# Patient Record
Sex: Female | Born: 1970 | Race: White | Hispanic: No | Marital: Single | Smoking: Never smoker
Health system: Southern US, Community
[De-identification: ages and names within clinical notes are randomized; demographics above are authoritative.]

## PROBLEM LIST (undated history)

## (undated) ENCOUNTER — Ambulatory Visit: Admission: EM | Discharge status: Absent without leave | Payer: Medicare Other

## (undated) DIAGNOSIS — I82409 Acute embolism and thrombosis of unspecified deep veins of unspecified lower extremity: Secondary | ICD-10-CM

## (undated) DIAGNOSIS — M1990 Unspecified osteoarthritis, unspecified site: Secondary | ICD-10-CM

## (undated) DIAGNOSIS — I8290 Acute embolism and thrombosis of unspecified vein: Secondary | ICD-10-CM

## (undated) DIAGNOSIS — J45909 Unspecified asthma, uncomplicated: Secondary | ICD-10-CM

## (undated) DIAGNOSIS — E079 Disorder of thyroid, unspecified: Secondary | ICD-10-CM

## (undated) DIAGNOSIS — K5190 Ulcerative colitis, unspecified, without complications: Secondary | ICD-10-CM

## (undated) DIAGNOSIS — M329 Systemic lupus erythematosus, unspecified: Secondary | ICD-10-CM

## (undated) DIAGNOSIS — M069 Rheumatoid arthritis, unspecified: Secondary | ICD-10-CM

## (undated) DIAGNOSIS — B019 Varicella without complication: Secondary | ICD-10-CM

## (undated) DIAGNOSIS — B029 Zoster without complications: Secondary | ICD-10-CM

## (undated) DIAGNOSIS — K219 Gastro-esophageal reflux disease without esophagitis: Secondary | ICD-10-CM

## (undated) DIAGNOSIS — J449 Chronic obstructive pulmonary disease, unspecified: Secondary | ICD-10-CM

## (undated) DIAGNOSIS — Q909 Down syndrome, unspecified: Secondary | ICD-10-CM

## (undated) DIAGNOSIS — H9192 Unspecified hearing loss, left ear: Secondary | ICD-10-CM

## (undated) DIAGNOSIS — E039 Hypothyroidism, unspecified: Secondary | ICD-10-CM

## (undated) DIAGNOSIS — M359 Systemic involvement of connective tissue, unspecified: Secondary | ICD-10-CM

## (undated) DIAGNOSIS — M810 Age-related osteoporosis without current pathological fracture: Secondary | ICD-10-CM

## (undated) DIAGNOSIS — T07XXXA Unspecified multiple injuries, initial encounter: Secondary | ICD-10-CM

## (undated) DIAGNOSIS — K5100 Ulcerative (chronic) pancolitis without complications: Secondary | ICD-10-CM

## (undated) DIAGNOSIS — K529 Noninfective gastroenteritis and colitis, unspecified: Secondary | ICD-10-CM

## (undated) DIAGNOSIS — M2550 Pain in unspecified joint: Secondary | ICD-10-CM

## (undated) HISTORY — PX: ABDOMINAL HYSTERECTOMY: SHX81

## (undated) HISTORY — DX: Down syndrome, unspecified: Q90.9

## (undated) HISTORY — DX: Unspecified multiple injuries, initial encounter: T07.XXXA

## (undated) HISTORY — DX: Pain in unspecified joint: M25.50

## (undated) HISTORY — DX: Unspecified osteoarthritis, unspecified site: M19.90

## (undated) HISTORY — DX: Varicella without complication: B01.9

## (undated) HISTORY — DX: Unspecified hearing loss, left ear: H91.92

## (undated) HISTORY — DX: Disorder of thyroid, unspecified: E07.9

## (undated) HISTORY — DX: Noninfective gastroenteritis and colitis, unspecified: K52.9

## (undated) HISTORY — DX: Rheumatoid arthritis, unspecified: M06.9

## (undated) HISTORY — DX: Unspecified asthma, uncomplicated: J45.909

## (undated) HISTORY — DX: Zoster without complications: B02.9

## (undated) HISTORY — DX: Ulcerative colitis, unspecified, without complications: K51.90

## (undated) HISTORY — DX: Chronic obstructive pulmonary disease, unspecified: J44.9

## (undated) HISTORY — PX: FOOT SURGERY: SHX648

## (undated) HISTORY — PX: BUNIONECTOMY: SHX129

## (undated) HISTORY — DX: Age-related osteoporosis without current pathological fracture: M81.0

## (undated) HISTORY — DX: Acute embolism and thrombosis of unspecified deep veins of unspecified lower extremity: I82.409

## (undated) HISTORY — PX: HALLUX VALGUS CORRECTION: SUR315

---

## 1990-05-31 HISTORY — PX: ABDOMINAL HYSTERECTOMY W/ PARTIAL VAGINACTOMY: SUR660

## 1993-05-31 DIAGNOSIS — M329 Systemic lupus erythematosus, unspecified: Secondary | ICD-10-CM

## 1993-05-31 DIAGNOSIS — IMO0002 Reserved for concepts with insufficient information to code with codable children: Secondary | ICD-10-CM

## 1993-05-31 HISTORY — DX: Systemic lupus erythematosus, unspecified: M32.9

## 1993-05-31 HISTORY — DX: Reserved for concepts with insufficient information to code with codable children: IMO0002

## 1997-10-31 ENCOUNTER — Emergency Department (HOSPITAL_COMMUNITY): Admission: EM | Admit: 1997-10-31 | Discharge: 1997-10-31 | Payer: Self-pay

## 1998-08-07 ENCOUNTER — Encounter: Payer: Self-pay | Admitting: Emergency Medicine

## 1998-08-07 ENCOUNTER — Emergency Department (HOSPITAL_COMMUNITY): Admission: EM | Admit: 1998-08-07 | Discharge: 1998-08-07 | Payer: Self-pay | Admitting: Emergency Medicine

## 2003-07-25 ENCOUNTER — Encounter: Admission: RE | Admit: 2003-07-25 | Discharge: 2003-08-21 | Payer: Self-pay

## 2003-09-24 HISTORY — PX: COLONOSCOPY: SHX174

## 2004-07-02 ENCOUNTER — Ambulatory Visit: Payer: Self-pay | Admitting: Internal Medicine

## 2004-09-22 ENCOUNTER — Ambulatory Visit: Payer: Self-pay | Admitting: Rheumatology

## 2004-09-30 ENCOUNTER — Emergency Department (HOSPITAL_COMMUNITY): Admission: EM | Admit: 2004-09-30 | Discharge: 2004-09-30 | Payer: Self-pay | Admitting: Emergency Medicine

## 2004-10-12 ENCOUNTER — Ambulatory Visit: Payer: Self-pay | Admitting: Cardiology

## 2004-10-12 ENCOUNTER — Ambulatory Visit: Payer: Self-pay | Admitting: Internal Medicine

## 2005-03-23 ENCOUNTER — Ambulatory Visit (HOSPITAL_COMMUNITY): Admission: RE | Admit: 2005-03-23 | Discharge: 2005-03-23 | Payer: Self-pay | Admitting: Internal Medicine

## 2005-03-23 ENCOUNTER — Ambulatory Visit: Payer: Self-pay | Admitting: Internal Medicine

## 2005-04-27 ENCOUNTER — Ambulatory Visit: Payer: Self-pay | Admitting: Internal Medicine

## 2005-05-03 ENCOUNTER — Ambulatory Visit: Payer: Self-pay | Admitting: Internal Medicine

## 2005-05-03 ENCOUNTER — Ambulatory Visit (HOSPITAL_COMMUNITY): Admission: RE | Admit: 2005-05-03 | Discharge: 2005-05-03 | Payer: Self-pay

## 2005-05-06 ENCOUNTER — Ambulatory Visit: Payer: Self-pay | Admitting: Internal Medicine

## 2005-06-25 ENCOUNTER — Ambulatory Visit: Payer: Self-pay | Admitting: Internal Medicine

## 2005-07-05 ENCOUNTER — Ambulatory Visit: Payer: Self-pay | Admitting: Internal Medicine

## 2006-01-07 ENCOUNTER — Ambulatory Visit: Payer: Self-pay | Admitting: Internal Medicine

## 2006-03-10 ENCOUNTER — Encounter (INDEPENDENT_AMBULATORY_CARE_PROVIDER_SITE_OTHER): Payer: Self-pay | Admitting: Internal Medicine

## 2006-03-10 ENCOUNTER — Ambulatory Visit: Payer: Self-pay | Admitting: Internal Medicine

## 2006-03-10 LAB — CONVERTED CEMR LAB
ALT: 18 units/L (ref 0–40)
AST: 23 units/L (ref 0–37)
Albumin: 3.6 g/dL (ref 3.5–5.2)
BUN: 17 mg/dL (ref 6–23)
Bilirubin Urine: NEGATIVE
CO2: 23 meq/L (ref 19–32)
Calcium: 9.1 mg/dL (ref 8.4–10.5)
Chloride: 103 meq/L (ref 96–112)
Creatinine, Ser: 1.01 mg/dL (ref 0.40–1.20)
Glucose, Bld: 84 mg/dL (ref 70–99)
Ketones, ur: NEGATIVE mg/dL
Leukocyte count, blood: 5.4 10*9/L (ref 4.0–10.5)
MCHC: 32 g/dL (ref 30.0–36.0)
MCV: 106 fL — ABNORMAL HIGH (ref 78.0–100.0)
Platelets: 300 10*3/uL (ref 150–400)
Protein, ur: NEGATIVE mg/dL
RBC: 4.01 M/uL (ref 3.87–5.11)
Sodium: 139 meq/L (ref 135–145)
Specific Gravity, Urine: 1.02 (ref 1.005–1.03)
Total Protein: 6.8 g/dL (ref 6.0–8.3)
WBC Urine, dipstick: NEGATIVE
pH: 6 (ref 5.0–8.0)

## 2006-03-18 DIAGNOSIS — J45909 Unspecified asthma, uncomplicated: Secondary | ICD-10-CM | POA: Insufficient documentation

## 2006-03-18 DIAGNOSIS — K219 Gastro-esophageal reflux disease without esophagitis: Secondary | ICD-10-CM

## 2006-03-18 DIAGNOSIS — E039 Hypothyroidism, unspecified: Secondary | ICD-10-CM

## 2006-03-18 DIAGNOSIS — Q909 Down syndrome, unspecified: Secondary | ICD-10-CM

## 2006-03-18 DIAGNOSIS — L708 Other acne: Secondary | ICD-10-CM

## 2006-03-21 ENCOUNTER — Ambulatory Visit: Payer: Self-pay | Admitting: Hospitalist

## 2006-03-21 ENCOUNTER — Encounter: Admission: RE | Admit: 2006-03-21 | Discharge: 2006-03-21 | Payer: Self-pay | Admitting: Internal Medicine

## 2006-03-21 ENCOUNTER — Ambulatory Visit: Payer: Self-pay | Admitting: Internal Medicine

## 2006-03-22 ENCOUNTER — Inpatient Hospital Stay (HOSPITAL_COMMUNITY): Admission: AD | Admit: 2006-03-22 | Discharge: 2006-03-24 | Payer: Self-pay | Admitting: Hospitalist

## 2006-04-06 ENCOUNTER — Encounter (INDEPENDENT_AMBULATORY_CARE_PROVIDER_SITE_OTHER): Payer: Self-pay | Admitting: Internal Medicine

## 2006-04-06 ENCOUNTER — Ambulatory Visit (HOSPITAL_COMMUNITY): Admission: RE | Admit: 2006-04-06 | Discharge: 2006-04-06 | Payer: Self-pay | Admitting: Hospitalist

## 2006-04-06 ENCOUNTER — Ambulatory Visit: Payer: Self-pay | Admitting: Hospitalist

## 2006-04-06 LAB — CONVERTED CEMR LAB
ALT: 25 units/L (ref 0–35)
AST: 25 units/L (ref 0–37)
Albumin: 3.3 g/dL — ABNORMAL LOW (ref 3.5–5.2)
BUN: 13 mg/dL (ref 6–23)
CO2: 29 meq/L (ref 19–32)
Chloride: 102 meq/L (ref 96–112)
Creatinine, Ser: 0.9 mg/dL (ref 0.40–1.20)
Glucose, Bld: 86 mg/dL (ref 70–99)
Platelets: 280 10*3/uL (ref 152–374)
Sodium: 137 meq/L (ref 135–145)
Total Protein: 6 g/dL (ref 6.0–8.3)

## 2006-05-05 ENCOUNTER — Ambulatory Visit: Payer: Self-pay | Admitting: Internal Medicine

## 2006-05-05 ENCOUNTER — Ambulatory Visit (HOSPITAL_COMMUNITY): Admission: RE | Admit: 2006-05-05 | Discharge: 2006-05-05 | Payer: Self-pay | Admitting: Internal Medicine

## 2006-05-05 ENCOUNTER — Encounter (INDEPENDENT_AMBULATORY_CARE_PROVIDER_SITE_OTHER): Payer: Self-pay | Admitting: *Deleted

## 2006-05-05 LAB — CONVERTED CEMR LAB
ALT: 23 units/L (ref 0–35)
AST: 27 units/L (ref 0–37)
BUN: 18 mg/dL (ref 6–23)
Basophils Relative: 0 % (ref 0–1)
Eosinophils Relative: 1 % (ref 0–4)
HCT: 38.7 % (ref 34.4–43.3)
Hemoglobin: 13.3 g/dL (ref 11.7–14.8)
Lymphocytes Relative: 9 % — ABNORMAL LOW (ref 15–43)
Lymphs Abs: 0.6 10*3/uL — ABNORMAL LOW (ref 0.8–3.1)
MCHC: 34.3 g/dL (ref 33.1–35.4)
Neutro Abs: 5.7 10*3/uL (ref 1.8–6.8)
Platelets: 280 10*3/uL (ref 152–374)
RBC Folate: 1167 ng/mL — ABNORMAL HIGH (ref 180–600)
RBC: 3.65 M/uL — ABNORMAL LOW (ref 3.79–4.96)
Total Bilirubin: 0.5 mg/dL (ref 0.3–1.2)
Total Protein: 6.4 g/dL (ref 6.0–8.3)

## 2006-06-11 DIAGNOSIS — M949 Disorder of cartilage, unspecified: Secondary | ICD-10-CM

## 2006-06-11 DIAGNOSIS — M1990 Unspecified osteoarthritis, unspecified site: Secondary | ICD-10-CM | POA: Insufficient documentation

## 2006-06-11 DIAGNOSIS — K5190 Ulcerative colitis, unspecified, without complications: Secondary | ICD-10-CM | POA: Insufficient documentation

## 2006-06-11 DIAGNOSIS — M06042 Rheumatoid arthritis without rheumatoid factor, left hand: Secondary | ICD-10-CM

## 2006-06-11 DIAGNOSIS — M06041 Rheumatoid arthritis without rheumatoid factor, right hand: Secondary | ICD-10-CM

## 2006-06-11 DIAGNOSIS — Z90710 Acquired absence of both cervix and uterus: Secondary | ICD-10-CM | POA: Insufficient documentation

## 2006-06-11 DIAGNOSIS — M899 Disorder of bone, unspecified: Secondary | ICD-10-CM | POA: Insufficient documentation

## 2006-08-09 ENCOUNTER — Ambulatory Visit: Payer: Self-pay | Admitting: Internal Medicine

## 2006-08-23 ENCOUNTER — Encounter (INDEPENDENT_AMBULATORY_CARE_PROVIDER_SITE_OTHER): Payer: Self-pay | Admitting: *Deleted

## 2006-08-25 ENCOUNTER — Encounter (INDEPENDENT_AMBULATORY_CARE_PROVIDER_SITE_OTHER): Payer: Self-pay | Admitting: Internal Medicine

## 2006-08-25 ENCOUNTER — Ambulatory Visit: Payer: Self-pay | Admitting: *Deleted

## 2006-09-03 LAB — CONVERTED CEMR LAB
ALT: 16 units/L (ref 0–35)
AST: 19 units/L (ref 0–37)
Albumin: 3.5 g/dL (ref 3.5–5.2)
BUN: 16 mg/dL (ref 6–23)
Calcium: 8.7 mg/dL (ref 8.4–10.5)
Creatinine, Ser: 0.87 mg/dL (ref 0.40–1.20)
Glucose, Bld: 80 mg/dL (ref 70–99)
HCT: 43.7 % (ref 36.0–46.0)
Hemoglobin: 13.5 g/dL (ref 12.0–15.0)
MCHC: 30.9 g/dL (ref 30.0–36.0)
Platelets: 283 10*3/uL (ref 150–400)
Potassium: 4.5 meq/L (ref 3.5–5.3)
Total Bilirubin: 0.4 mg/dL (ref 0.3–1.2)
Total Protein: 6.8 g/dL (ref 6.0–8.3)
WBC: 4.9 10*3/uL (ref 4.0–10.5)

## 2006-09-29 ENCOUNTER — Observation Stay (HOSPITAL_COMMUNITY): Admission: EM | Admit: 2006-09-29 | Discharge: 2006-09-30 | Payer: Self-pay | Admitting: Emergency Medicine

## 2006-09-29 ENCOUNTER — Ambulatory Visit: Payer: Self-pay | Admitting: Internal Medicine

## 2006-09-30 ENCOUNTER — Encounter (INDEPENDENT_AMBULATORY_CARE_PROVIDER_SITE_OTHER): Payer: Self-pay | Admitting: Internal Medicine

## 2006-10-14 ENCOUNTER — Encounter (INDEPENDENT_AMBULATORY_CARE_PROVIDER_SITE_OTHER): Payer: Self-pay | Admitting: Hospitalist

## 2006-10-20 ENCOUNTER — Encounter (INDEPENDENT_AMBULATORY_CARE_PROVIDER_SITE_OTHER): Payer: Self-pay | Admitting: Internal Medicine

## 2006-10-20 ENCOUNTER — Ambulatory Visit: Payer: Self-pay | Admitting: Hospitalist

## 2006-11-03 LAB — CONVERTED CEMR LAB
AST: 23 units/L (ref 0–37)
Albumin: 4.1 g/dL (ref 3.5–5.2)
Basophils Relative: 1 % (ref 0–1)
CO2: 27 meq/L (ref 19–32)
Calcium: 9.6 mg/dL (ref 8.4–10.5)
Creatinine, Ser: 0.98 mg/dL (ref 0.40–1.20)
HCT: 45.7 % (ref 36.0–46.0)
Hemoglobin: 14.7 g/dL (ref 12.0–15.0)
Lymphs Abs: 0.9 10*3/uL (ref 0.7–3.3)
MCHC: 32.2 g/dL (ref 30.0–36.0)
Monocytes Absolute: 0.3 10*3/uL (ref 0.2–0.7)
Neutrophils Relative %: 76 % (ref 43–77)
Platelets: 262 10*3/uL (ref 150–400)
Sodium: 143 meq/L (ref 135–145)
TSH: 0.125 microintl units/mL — ABNORMAL LOW (ref 0.350–5.50)

## 2006-11-21 ENCOUNTER — Encounter (INDEPENDENT_AMBULATORY_CARE_PROVIDER_SITE_OTHER): Payer: Self-pay | Admitting: Internal Medicine

## 2006-11-21 ENCOUNTER — Ambulatory Visit: Payer: Self-pay | Admitting: Internal Medicine

## 2006-11-25 ENCOUNTER — Telehealth (INDEPENDENT_AMBULATORY_CARE_PROVIDER_SITE_OTHER): Payer: Self-pay | Admitting: Internal Medicine

## 2006-11-25 LAB — CONVERTED CEMR LAB
AST: 22 units/L (ref 0–37)
Albumin: 3.9 g/dL (ref 3.5–5.2)
BUN: 16 mg/dL (ref 6–23)
Basophils Relative: 0 % (ref 0–1)
Creatinine, Ser: 0.94 mg/dL (ref 0.40–1.20)
Hemoglobin: 14.4 g/dL (ref 12.0–15.0)
Lymphs Abs: 0.7 10*3/uL (ref 0.7–3.3)
MCHC: 31.2 g/dL (ref 30.0–36.0)
MCV: 111.1 fL — ABNORMAL HIGH (ref 78.0–100.0)
Monocytes Absolute: 0.4 10*3/uL (ref 0.2–0.7)
Monocytes Relative: 6 % (ref 3–11)
Neutrophils Relative %: 83 % — ABNORMAL HIGH (ref 43–77)
Platelets: 284 10*3/uL (ref 150–400)
RBC: 4.15 M/uL (ref 3.87–5.11)
Total Bilirubin: 0.4 mg/dL (ref 0.3–1.2)
Vitamin B-12: 453 pg/mL (ref 211–911)
WBC: 6.8 10*3/uL (ref 4.0–10.5)

## 2006-12-08 ENCOUNTER — Encounter (INDEPENDENT_AMBULATORY_CARE_PROVIDER_SITE_OTHER): Payer: Self-pay | Admitting: Internal Medicine

## 2007-01-02 ENCOUNTER — Encounter (INDEPENDENT_AMBULATORY_CARE_PROVIDER_SITE_OTHER): Payer: Self-pay | Admitting: Internal Medicine

## 2007-01-27 ENCOUNTER — Ambulatory Visit: Payer: Self-pay | Admitting: Internal Medicine

## 2007-01-27 ENCOUNTER — Encounter: Payer: Self-pay | Admitting: Internal Medicine

## 2007-02-05 LAB — CONVERTED CEMR LAB
ALT: 17 units/L (ref 0–35)
Calcium: 9.2 mg/dL (ref 8.4–10.5)
Chloride: 103 meq/L (ref 96–112)
Creatinine, Ser: 0.9 mg/dL (ref 0.40–1.20)
Hemoglobin: 14.8 g/dL (ref 12.0–15.0)
Lymphocytes Relative: 15 % (ref 12–46)
MCV: 107.6 fL — ABNORMAL HIGH (ref 78.0–100.0)
Monocytes Absolute: 0.3 10*3/uL (ref 0.2–0.7)
Monocytes Relative: 6 % (ref 3–11)
Neutro Abs: 3.9 10*3/uL (ref 1.7–7.7)
Neutrophils Relative %: 77 % (ref 43–77)
RBC: 4.19 M/uL (ref 3.87–5.11)
RDW: 15.1 % — ABNORMAL HIGH (ref 11.5–14.0)
Sodium: 139 meq/L (ref 135–145)
Total Protein: 7.3 g/dL (ref 6.0–8.3)

## 2007-05-30 ENCOUNTER — Encounter (INDEPENDENT_AMBULATORY_CARE_PROVIDER_SITE_OTHER): Payer: Self-pay | Admitting: Internal Medicine

## 2007-05-30 ENCOUNTER — Ambulatory Visit: Payer: Self-pay | Admitting: Hospitalist

## 2007-05-31 LAB — CONVERTED CEMR LAB
ALT: 30 units/L (ref 0–35)
AST: 28 units/L (ref 0–37)
BUN: 12 mg/dL (ref 6–23)
Basophils Absolute: 0 10*3/uL (ref 0.0–0.1)
Basophils Relative: 1 % (ref 0–1)
CO2: 27 meq/L (ref 19–32)
Chloride: 108 meq/L (ref 96–112)
Creatinine, Ser: 0.82 mg/dL (ref 0.40–1.20)
Glucose, Bld: 92 mg/dL (ref 70–99)
HCT: 43.6 % (ref 36.0–46.0)
Ketones, ur: NEGATIVE mg/dL
Leukocytes, UA: NEGATIVE
Lymphs Abs: 0.6 10*3/uL — ABNORMAL LOW (ref 0.7–4.0)
Monocytes Relative: 6 % (ref 3–12)
Neutro Abs: 4.8 10*3/uL (ref 1.7–7.7)
Nitrite: NEGATIVE
Platelets: 284 10*3/uL (ref 150–400)
Potassium: 4.2 meq/L (ref 3.5–5.3)
Protein, ur: NEGATIVE mg/dL
RBC: 3.99 M/uL (ref 3.87–5.11)
Sodium: 140 meq/L (ref 135–145)
Total Protein: 6.8 g/dL (ref 6.0–8.3)
WBC: 5.9 10*3/uL (ref 4.0–10.5)

## 2007-07-04 ENCOUNTER — Encounter (INDEPENDENT_AMBULATORY_CARE_PROVIDER_SITE_OTHER): Payer: Self-pay | Admitting: Internal Medicine

## 2007-07-04 ENCOUNTER — Ambulatory Visit: Payer: Self-pay | Admitting: Internal Medicine

## 2007-07-07 LAB — CONVERTED CEMR LAB
ALT: 23 units/L (ref 0–35)
Basophils Absolute: 0 10*3/uL (ref 0.0–0.1)
CO2: 27 meq/L (ref 19–32)
Chloride: 105 meq/L (ref 96–112)
Creatinine, Ser: 0.91 mg/dL (ref 0.40–1.20)
Eosinophils Relative: 1 % (ref 0–5)
HCT: 44.2 % (ref 36.0–46.0)
Hemoglobin: 14.4 g/dL (ref 12.0–15.0)
MCHC: 32.6 g/dL (ref 30.0–36.0)
MCV: 107 fL — ABNORMAL HIGH (ref 78.0–100.0)
Neutro Abs: 6.2 10*3/uL (ref 1.7–7.7)
Potassium: 4.4 meq/L (ref 3.5–5.3)
RBC: 4.13 M/uL (ref 3.87–5.11)
RDW: 15 % (ref 11.5–15.5)
Total Protein: 6.9 g/dL (ref 6.0–8.3)
WBC: 7.5 10*3/uL (ref 4.0–10.5)

## 2007-07-18 ENCOUNTER — Ambulatory Visit: Payer: Self-pay | Admitting: *Deleted

## 2007-07-18 ENCOUNTER — Observation Stay (HOSPITAL_COMMUNITY): Admission: AD | Admit: 2007-07-18 | Discharge: 2007-07-19 | Payer: Self-pay | Admitting: Infectious Diseases

## 2007-07-18 ENCOUNTER — Telehealth (INDEPENDENT_AMBULATORY_CARE_PROVIDER_SITE_OTHER): Payer: Self-pay | Admitting: *Deleted

## 2007-07-18 ENCOUNTER — Ambulatory Visit: Payer: Self-pay | Admitting: Internal Medicine

## 2007-08-17 ENCOUNTER — Encounter (INDEPENDENT_AMBULATORY_CARE_PROVIDER_SITE_OTHER): Payer: Self-pay | Admitting: Internal Medicine

## 2007-09-20 ENCOUNTER — Ambulatory Visit (HOSPITAL_COMMUNITY): Admission: RE | Admit: 2007-09-20 | Discharge: 2007-09-20 | Payer: Self-pay | Admitting: Hospitalist

## 2007-09-20 ENCOUNTER — Encounter (INDEPENDENT_AMBULATORY_CARE_PROVIDER_SITE_OTHER): Payer: Self-pay | Admitting: Internal Medicine

## 2007-09-20 ENCOUNTER — Ambulatory Visit: Payer: Self-pay | Admitting: Hospitalist

## 2007-09-20 LAB — CONVERTED CEMR LAB
Basophils Absolute: 0 10*3/uL (ref 0.0–0.1)
EBV VCA IgG: 0.28
HCT: 42.3 % (ref 36.0–46.0)
Lymphocytes Relative: 17 % (ref 12–46)
Lymphs Abs: 1.1 10*3/uL (ref 0.7–4.0)
MCHC: 33.1 g/dL (ref 30.0–36.0)
MCV: 105 fL — ABNORMAL HIGH (ref 78.0–100.0)
Neutro Abs: 4.7 10*3/uL (ref 1.7–7.7)
Platelets: 330 10*3/uL (ref 150–400)
RDW: 16 % — ABNORMAL HIGH (ref 11.5–15.5)

## 2007-09-22 ENCOUNTER — Encounter (INDEPENDENT_AMBULATORY_CARE_PROVIDER_SITE_OTHER): Payer: Self-pay | Admitting: Internal Medicine

## 2007-09-22 ENCOUNTER — Telehealth: Payer: Self-pay | Admitting: *Deleted

## 2007-09-22 ENCOUNTER — Ambulatory Visit: Payer: Self-pay | Admitting: Hospitalist

## 2007-09-26 ENCOUNTER — Ambulatory Visit (HOSPITAL_COMMUNITY): Admission: RE | Admit: 2007-09-26 | Discharge: 2007-09-26 | Payer: Self-pay | Admitting: Internal Medicine

## 2007-10-12 ENCOUNTER — Encounter (INDEPENDENT_AMBULATORY_CARE_PROVIDER_SITE_OTHER): Payer: Self-pay | Admitting: Internal Medicine

## 2007-10-17 ENCOUNTER — Encounter (INDEPENDENT_AMBULATORY_CARE_PROVIDER_SITE_OTHER): Payer: Self-pay | Admitting: Internal Medicine

## 2007-10-19 ENCOUNTER — Ambulatory Visit (HOSPITAL_COMMUNITY): Admission: RE | Admit: 2007-10-19 | Discharge: 2007-10-19 | Payer: Self-pay | Admitting: Internal Medicine

## 2007-10-19 ENCOUNTER — Ambulatory Visit: Payer: Self-pay | Admitting: Internal Medicine

## 2007-10-20 ENCOUNTER — Ambulatory Visit: Payer: Self-pay | Admitting: Internal Medicine

## 2007-11-29 ENCOUNTER — Ambulatory Visit: Payer: Self-pay | Admitting: Internal Medicine

## 2007-11-29 ENCOUNTER — Ambulatory Visit (HOSPITAL_COMMUNITY): Admission: RE | Admit: 2007-11-29 | Discharge: 2007-11-29 | Payer: Self-pay | Admitting: Internal Medicine

## 2007-12-12 ENCOUNTER — Encounter (INDEPENDENT_AMBULATORY_CARE_PROVIDER_SITE_OTHER): Payer: Self-pay | Admitting: Internal Medicine

## 2007-12-14 ENCOUNTER — Ambulatory Visit: Payer: Self-pay | Admitting: *Deleted

## 2007-12-27 ENCOUNTER — Telehealth (INDEPENDENT_AMBULATORY_CARE_PROVIDER_SITE_OTHER): Payer: Self-pay | Admitting: Internal Medicine

## 2008-01-10 ENCOUNTER — Ambulatory Visit: Payer: Self-pay | Admitting: Internal Medicine

## 2008-01-12 ENCOUNTER — Telehealth: Payer: Self-pay | Admitting: *Deleted

## 2008-02-09 ENCOUNTER — Emergency Department (HOSPITAL_COMMUNITY): Admission: EM | Admit: 2008-02-09 | Discharge: 2008-02-09 | Payer: Self-pay | Admitting: Family Medicine

## 2008-07-18 ENCOUNTER — Ambulatory Visit: Payer: Self-pay | Admitting: Internal Medicine

## 2008-07-19 DIAGNOSIS — M329 Systemic lupus erythematosus, unspecified: Secondary | ICD-10-CM

## 2008-07-19 LAB — CONVERTED CEMR LAB
Alkaline Phosphatase: 95 units/L (ref 39–117)
Basophils Absolute: 0 10*3/uL (ref 0.0–0.1)
Basophils Relative: 0 % (ref 0–1)
Bilirubin Urine: NEGATIVE
CO2: 27 meq/L (ref 19–32)
Calcium: 8.9 mg/dL (ref 8.4–10.5)
Chloride: 105 meq/L (ref 96–112)
Creatinine, Ser: 0.84 mg/dL (ref 0.40–1.20)
Eosinophils Absolute: 0.1 10*3/uL (ref 0.0–0.7)
Glucose, Bld: 93 mg/dL (ref 70–99)
Leukocytes, UA: NEGATIVE
MCV: 109.3 fL — ABNORMAL HIGH (ref 78.0–100.0)
Monocytes Absolute: 0.5 10*3/uL (ref 0.1–1.0)
Neutro Abs: 6.2 10*3/uL (ref 1.7–7.7)
Protein, ur: NEGATIVE mg/dL
RBC: 4.18 M/uL (ref 3.87–5.11)
Sodium: 142 meq/L (ref 135–145)
Total Bilirubin: 0.4 mg/dL (ref 0.3–1.2)
Triglycerides: 111 mg/dL (ref <150)
Urobilinogen, UA: 0.2 (ref 0.0–1.0)
pH: 6.5 (ref 5.0–8.0)

## 2008-07-22 ENCOUNTER — Telehealth: Payer: Self-pay | Admitting: *Deleted

## 2008-12-09 ENCOUNTER — Encounter (INDEPENDENT_AMBULATORY_CARE_PROVIDER_SITE_OTHER): Payer: Self-pay | Admitting: Internal Medicine

## 2009-01-31 ENCOUNTER — Encounter: Payer: Self-pay | Admitting: Internal Medicine

## 2009-01-31 ENCOUNTER — Ambulatory Visit: Payer: Self-pay | Admitting: Infectious Diseases

## 2009-01-31 LAB — CONVERTED CEMR LAB
AST: 23 units/L (ref 0–37)
BUN: 15 mg/dL (ref 6–23)
CO2: 25 meq/L (ref 19–32)
Creatinine, Ser: 0.84 mg/dL (ref 0.40–1.20)
Eosinophils Relative: 1 % (ref 0–5)
Glucose, Bld: 95 mg/dL (ref 70–99)
HCT: 44.1 % (ref 36.0–46.0)
Hemoglobin: 14.6 g/dL (ref 12.0–15.0)
LDL Cholesterol: 112 mg/dL — ABNORMAL HIGH (ref 0–99)
MCHC: 33.1 g/dL (ref 30.0–36.0)
MCV: 108.4 fL — ABNORMAL HIGH (ref >=78.0)
Neutro Abs: 7.1 10*3/uL (ref 1.7–7.7)
Platelets: 270 10*3/uL (ref 150–400)
RBC: 4.07 M/uL (ref 3.87–5.11)
RDW: 14.5 % (ref 11.5–15.5)
Total Bilirubin: 0.5 mg/dL (ref 0.3–1.2)
Total Protein: 7.2 g/dL (ref 6.0–8.3)
WBC: 8.7 10*3/uL (ref 4.0–10.5)

## 2009-02-17 ENCOUNTER — Telehealth: Payer: Self-pay | Admitting: Internal Medicine

## 2009-03-10 ENCOUNTER — Telehealth: Payer: Self-pay | Admitting: Internal Medicine

## 2009-03-12 ENCOUNTER — Encounter (INDEPENDENT_AMBULATORY_CARE_PROVIDER_SITE_OTHER): Payer: Self-pay | Admitting: Internal Medicine

## 2009-03-12 ENCOUNTER — Ambulatory Visit: Payer: Self-pay | Admitting: Internal Medicine

## 2009-03-12 DIAGNOSIS — L0293 Carbuncle, unspecified: Secondary | ICD-10-CM

## 2009-03-12 LAB — CONVERTED CEMR LAB
ALT: 27 units/L (ref 0–35)
AST: 29 units/L (ref 0–37)
Alkaline Phosphatase: 91 units/L (ref 39–117)
Calcium: 9 mg/dL (ref 8.4–10.5)
HCT: 42.3 % (ref 36.0–46.0)
Hemoglobin, Urine: NEGATIVE
Hemoglobin: 14.5 g/dL (ref 12.0–15.0)
MCHC: 34.3 g/dL (ref 30.0–36.0)
MCV: 109.8 fL — ABNORMAL HIGH (ref >=78.0)
Potassium: 4.2 meq/L (ref 3.5–5.3)
RDW: 15 % (ref 11.5–15.5)
Total Bilirubin: 0.1 mg/dL — ABNORMAL LOW (ref 0.3–1.2)
Urine Glucose: NEGATIVE mg/dL
Urobilinogen, UA: 0.2 (ref 0.0–1.0)
WBC: 6 10*3/uL (ref 4.0–10.5)

## 2009-03-25 ENCOUNTER — Telehealth: Payer: Self-pay | Admitting: Internal Medicine

## 2009-04-02 ENCOUNTER — Telehealth: Payer: Self-pay | Admitting: Internal Medicine

## 2009-04-30 ENCOUNTER — Ambulatory Visit: Payer: Self-pay | Admitting: Infectious Disease

## 2009-04-30 ENCOUNTER — Encounter (INDEPENDENT_AMBULATORY_CARE_PROVIDER_SITE_OTHER): Payer: Self-pay | Admitting: Internal Medicine

## 2009-05-23 ENCOUNTER — Emergency Department: Payer: Self-pay | Admitting: Internal Medicine

## 2009-05-23 ENCOUNTER — Encounter: Payer: Self-pay | Admitting: Pulmonary Disease

## 2009-05-26 ENCOUNTER — Encounter: Payer: Self-pay | Admitting: Pulmonary Disease

## 2009-05-26 ENCOUNTER — Inpatient Hospital Stay: Payer: Self-pay | Admitting: Internal Medicine

## 2009-05-29 ENCOUNTER — Encounter: Payer: Self-pay | Admitting: Pulmonary Disease

## 2009-06-03 ENCOUNTER — Ambulatory Visit: Payer: Self-pay | Admitting: Internal Medicine

## 2009-06-03 DIAGNOSIS — B370 Candidal stomatitis: Secondary | ICD-10-CM

## 2009-06-03 LAB — CONVERTED CEMR LAB
ALT: 60 units/L — ABNORMAL HIGH (ref 0–35)
Albumin: 3.5 g/dL (ref 3.5–5.2)
Alkaline Phosphatase: 85 units/L (ref 39–117)
BUN: 26 mg/dL — ABNORMAL HIGH (ref 6–23)
CO2: 26 meq/L (ref 19–32)
Chloride: 103 meq/L (ref 96–112)
Glucose, Bld: 124 mg/dL — ABNORMAL HIGH (ref 70–99)
HCT: 46.8 % — ABNORMAL HIGH (ref 36.0–46.0)
Sodium: 141 meq/L (ref 135–145)
Total Protein: 6.5 g/dL (ref 6.0–8.3)

## 2009-06-09 ENCOUNTER — Telehealth: Payer: Self-pay | Admitting: Internal Medicine

## 2009-06-13 ENCOUNTER — Ambulatory Visit: Payer: Self-pay | Admitting: Internal Medicine

## 2009-06-13 ENCOUNTER — Encounter: Payer: Self-pay | Admitting: Pulmonary Disease

## 2009-06-17 ENCOUNTER — Ambulatory Visit: Payer: Self-pay | Admitting: Pulmonary Disease

## 2009-06-17 DIAGNOSIS — J310 Chronic rhinitis: Secondary | ICD-10-CM

## 2009-06-19 ENCOUNTER — Telehealth (INDEPENDENT_AMBULATORY_CARE_PROVIDER_SITE_OTHER): Payer: Self-pay | Admitting: *Deleted

## 2009-06-23 ENCOUNTER — Encounter: Payer: Self-pay | Admitting: Internal Medicine

## 2009-06-25 ENCOUNTER — Encounter: Payer: Self-pay | Admitting: Pulmonary Disease

## 2009-07-22 ENCOUNTER — Encounter: Payer: Self-pay | Admitting: Internal Medicine

## 2009-08-22 ENCOUNTER — Ambulatory Visit: Payer: Self-pay | Admitting: Pulmonary Disease

## 2009-10-01 ENCOUNTER — Ambulatory Visit: Payer: Self-pay | Admitting: Internal Medicine

## 2009-10-02 ENCOUNTER — Encounter: Payer: Self-pay | Admitting: Pulmonary Disease

## 2009-11-24 ENCOUNTER — Telehealth: Payer: Self-pay | Admitting: Internal Medicine

## 2010-01-07 ENCOUNTER — Encounter: Payer: Self-pay | Admitting: Internal Medicine

## 2010-01-08 ENCOUNTER — Telehealth: Payer: Self-pay | Admitting: *Deleted

## 2010-01-29 ENCOUNTER — Ambulatory Visit: Payer: Self-pay | Admitting: Internal Medicine

## 2010-01-30 ENCOUNTER — Encounter: Payer: Self-pay | Admitting: Internal Medicine

## 2010-01-30 LAB — CONVERTED CEMR LAB
Cholesterol: 164 mg/dL (ref 0–200)
HDL: 49 mg/dL (ref >39)
LDL Cholesterol: 98 mg/dL (ref 0–99)
Total CHOL/HDL Ratio: 3.3
Triglycerides: 86 mg/dL (ref <150)

## 2010-02-17 ENCOUNTER — Telehealth: Payer: Self-pay | Admitting: *Deleted

## 2010-02-23 ENCOUNTER — Ambulatory Visit (HOSPITAL_COMMUNITY): Admission: RE | Admit: 2010-02-23 | Discharge: 2010-02-23 | Payer: Self-pay | Admitting: Internal Medicine

## 2010-02-23 ENCOUNTER — Ambulatory Visit: Payer: Self-pay | Admitting: Internal Medicine

## 2010-02-26 ENCOUNTER — Ambulatory Visit: Payer: Self-pay | Admitting: Internal Medicine

## 2010-02-27 ENCOUNTER — Telehealth: Payer: Self-pay | Admitting: *Deleted

## 2010-04-23 ENCOUNTER — Emergency Department: Payer: Self-pay | Admitting: Emergency Medicine

## 2010-05-04 ENCOUNTER — Ambulatory Visit: Payer: Self-pay | Admitting: Internal Medicine

## 2010-05-14 ENCOUNTER — Ambulatory Visit: Payer: Self-pay | Admitting: Internal Medicine

## 2010-05-15 ENCOUNTER — Telehealth: Payer: Self-pay | Admitting: *Deleted

## 2010-06-03 ENCOUNTER — Ambulatory Visit
Admission: RE | Admit: 2010-06-03 | Discharge: 2010-06-03 | Payer: Self-pay | Source: Home / Self Care | Attending: Internal Medicine | Admitting: Internal Medicine

## 2010-06-03 ENCOUNTER — Telehealth: Payer: Self-pay | Admitting: *Deleted

## 2010-06-03 DIAGNOSIS — R112 Nausea with vomiting, unspecified: Secondary | ICD-10-CM | POA: Insufficient documentation

## 2010-06-03 DIAGNOSIS — R1013 Epigastric pain: Secondary | ICD-10-CM | POA: Insufficient documentation

## 2010-06-03 LAB — CONVERTED CEMR LAB
Beta hcg, urine, semiquantitative: NEGATIVE
Glucose, Urine, Semiquant: NEGATIVE
Nitrite: NEGATIVE
Specific Gravity, Urine: <1.005
Urobilinogen, UA: 0.2

## 2010-06-30 NOTE — Progress Notes (Signed)
Summary: phone/gg    Phone Note Call from Patient   Summary of Call: Call from [place] at [facility] home. [phone]  ex [phone] ) calls  stating pt has  boils on left thigh, red and painfull also under rt breast.  Last time she had this she was put on antibiotic.  She has scheduled appointment July 7  but  request  antibiotic treatment now before appointment. Initial call taken by: Gevena Cotton RN,  November 24, 2009 3:15 PM  Additional Follow-up for Phone Call Additional follow up Details #1::        Rx called into [facility] [place] [facility] home and there pharmacy will fill. Additional Follow-up by: Gevena Cotton RN,  November 24, 2009 5:37 PM    New/Updated Medications: DOXYCYCLINE HYCLATE 100 MG CAPS (DOXYCYCLINE HYCLATE) one 2 times a day Prescriptions: DOXYCYCLINE HYCLATE 100 MG CAPS (DOXYCYCLINE HYCLATE) one 2 times a day  #10 x 1   Entered and Authorized by:   Burman Freestone MD   Signed by:   Burman Freestone MD on 11/24/2009   Method used:   Telephoned to ...         RxID:   [id number]

## 2010-06-30 NOTE — Assessment & Plan Note (Signed)
Summary: ACUTE-RE-CHECK TOE/BURN ON LEFT HAND/EAR WAX BUILD UP   Vital Signs:  Patient profile:   40 year old female Height:      57 inches Weight:      166.2 pounds BMI:     36.10 O2 Sat:      95 % on Room air Temp:     98.0 degrees F oral Pulse rate:   86 / minute BP sitting:   135 / 98  (right arm)  Vitals Entered By: Silverio Decamp NT II (February 23, 2010 3:22 PM)  O2 Flow:  Room air CC: check hand/ chest conjestion Pain Assessment Patient in pain? yes     Location: hand Intensity: 6 Type: aching Onset of pain  Sudden Nutritional Status BMI of > 30 = obese  Have you ever been in a relationship where you felt threatened, hurt or afraid?No   Does patient need assistance? Functional Status Self care Ambulation Normal   Primary Care Provider:  [name]  CC:  check hand/ chest conjestion.  History of Present Illness: 40 year old with Past Medical History: Anemia-NOS, hx of Asthma, GERD, Hypothyroidism Rheumatoid arthritis, seronegative, Dr. Jefm Bryant, Systemic lupus erythmatosus, Down's sydrome (Trisomy 21), Ulcerative colitis, Long-term steroid use, Osteopenia, Macrocytosis is here for:  1) Cough - productive with clear sputum. Wheezing, short of breath, runny nose, head congestion. Lives in group home, unsure about sick contacts. No documented fevers. No chills. Group home did not administer xopenex breathing treatments because it is as needed basis, and they did not know if needed.   2) Burn on left hand from spilling coffee - there is marked redness, swelling, and skin scabbing over dorsum left hand and 3 rd digit. Pt's mother states the redness and swelling as decreased tremendously with applying topical cream over it. It was oozing yellowish pus before. No longer any discharge present.   3) Cerumen inpaction bilaterally causing hearing impairment.     Preventive Screening-Counseling & Management  Alcohol-Tobacco     Alcohol drinks/day: 0     Smoking  Status: never  Caffeine-Diet-Exercise     Does Patient Exercise: no  Current Medications (verified): 1)  Symbicort 160-4.5 Mcg/act Aero (Budesonide-Formoterol Fumarate) .... Two Puffs Two Times A Day 2)  Spiriva Handihaler 18 Mcg Caps (Tiotropium Bromide Monohydrate) .... Take 1 Puff By Mouth Everyday 3)  Xopenex  Nebu (Levalbuterol Hcl Nebu) .... Inhale Via Nebulizer Two Times A Day As Needed 4)  Levothroid 150 Mcg Tabs (Levothyroxine Sodium) .... Take 1 Tablet By Mouth Once A Day 5)  Aspirin 81 Mg  Tbec (Aspirin) .... Take 1 Tablet By Mouth Once A Day 6)  Calcium-Vitamin D 500-200 Mg-Unit Tabs (Calcium Carbonate-Vitamin D) .... Take 1 Tablet By Mouth Two Times A Day 7)  Theragran Tabs (Multiple Vitamin) .... Take 1 Tablet By Mouth Once A Day 8)  Alendronate Sodium 70 Mg Tabs (Alendronate Sodium) .... Take 1 Tablet By Mouth Once A Week. 9)  Asacol 400 Mg Tbec (Mesalamine) .... Take 4 Tablets By Mouth Two Times A Day 10)  Prilosec Otc 20 Mg  Tbec (Omeprazole Magnesium) .... Take 1 Tablet By Mouth Once A Day 11)  Tretinoin 0.05 % Crea (Tretinoin) .... Apply To Face/body At Bedtime As Needed 12)  Methotrexate  Tabs (Methotrexate Sodium Tabs) .... Take 68m By Mouth Every Friday 13)  Folic Acid 1 Mg Tabs (Folic Acid) .... Take 1 Tablet By Mouth Once A Day 14)  Prednisone 2.5 Mg  Tabs (Prednisone) ..Marland KitchenMarland KitchenMarland Kitchen  Take 1 Tablet By Mouth Two Times A Day 15)  Lamisil At 1 % Crea (Terbinafine Hcl) .... Apply To Both Feet At Bedtime For Fungal Infection 16)  Constulose 10 Gm/47m Soln (Lactulose) .... Take 15 Ml By Mouth 5 Days A Week At Bedtime For Constipation 17)  Guaifenesin 400 Mg Tabs (Guaifenesin) .... Take 1 Tablet Every 8 Hour As Needed For Cough. 18)  Doxycycline Hyclate 100 Mg Caps (Doxycycline Hyclate) .... One 2 Times A Day For 7 Days As Needed For Boils and Skin Ulcers  Allergies (verified): No Known Drug Allergies  Past History:  Past Medical History: Last updated: 06/17/2009 Anemia-NOS,  hx of Asthma GERD Hypothyroidism Rheumatoid arthritis, seronegative, Dr. KJefm BryantAcne Systemic lupus erythmatosus Down's sydrome (Trisomy 21) Ulcerative colitis Mental retardation Long-term steroid use Osteopenia Macrocytosis Previous fracture of right foot Previous rib fractures Previous fracture of distal right fibula  Past Surgical History: Last updated: 03/18/2006 Hysterectomy B/L foot surgery  Family History: Last updated: 06/17/2009 Heart disease---mother  Social History: Last updated: 07/18/2008 Single Never Smoked Alcohol use-no Drug use-no Lives in a Group Home  Risk Factors: Alcohol Use: 0 (02/23/2010) Exercise: no (02/23/2010)  Risk Factors: Smoking Status: never (02/23/2010)  Review of Systems      See HPI  Physical Exam  General:  alert and well-developed.   Head:  normocephalic and atraumatic.   Eyes:  vision grossly intact, pupils equal, pupils round, and pupils reactive to light.   Ears:  R cerumen impaction and L Cerumen impaction.   Nose:  no external deformity, no external erythema, and no nasal discharge.   Mouth:  pharynx pink and moist, no erythema, and no exudates.   Neck:  supple.   Lungs:  normal respiratory effort, R wheezes, and L wheezes.   Heart:  normal rate and regular rhythm.   Abdomen:  soft and non-tender.   Msk:  redness over left hand near thumb and snuff box redness and lesion also on 3rd digits skin is scabbing over some warmth over hand no pus or drainage   Impression & Recommendations:  Problem # 1:  COUGH (ICD-786.2) Assessment Deteriorated Likely acute bronchitis vs URI. Patient has hx of asthma exacerbations. There is wheezing and congestion evident on physical exam. There is concern for PNA. Will get a cxr, breathing treatment now, and prescribe a prednisone taper, along with doxycycline. Re-evaluate pt on Friday. CXR did not reveal any focal consolidation.   1.  Stable chronic changes of COPD and  perihilar bronchitic change.   2.  No acute abnormality  Advised patient's mother to administer breathing treatment every 4 hours today and then twice daily as needed. Gave patient a dose of solumedrol now. Patient is satting 95% on RA. Clinically stable, and does not need to be admitted.   Orders: CXR- 2view (CXR) Admin of Therapeutic Inj  intramuscular or subcutaneous (([id number]  Problem # 2:  ACCIDENT CAUSED BY OTHER HOT SUBSTANCE OR OBJECT (ICD-E924.8) Assessment: New Patient accidentally poured hot coffee on left hand. There are two areas located on dorsum of hand where pt was burned. It appears to be healing. The skin is scabbing over. Patient is on chronic prednisone therapy. There is concern for poor wound healing. The doxycyline that will be given for acute bronchitis will also help with lesions on hand. Advised patient's mother to apply vitamin E oil to improve healing.   Complete Medication List: 1)  Symbicort 160-4.5 Mcg/act Aero (Budesonide-formoterol fumarate) .... Two puffs two times a day  2)  Spiriva Handihaler 18 Mcg Caps (Tiotropium bromide monohydrate) .... Take 1 puff by mouth everyday 3)  Xopenex Nebu (Levalbuterol hcl nebu) .... Inhale via nebulizer two times a day as needed 4)  Levothroid 150 Mcg Tabs (Levothyroxine sodium) .... Take 1 tablet by mouth once a day 5)  Aspirin 81 Mg Tbec (Aspirin) .... Take 1 tablet by mouth once a day 6)  Calcium-vitamin D 500-200 Mg-unit Tabs (Calcium carbonate-vitamin d) .... Take 1 tablet by mouth two times a day 7)  Theragran Tabs (Multiple vitamin) .... Take 1 tablet by mouth once a day 8)  Alendronate Sodium 70 Mg Tabs (Alendronate sodium) .... Take 1 tablet by mouth once a week. 9)  Asacol 400 Mg Tbec (Mesalamine) .... Take 4 tablets by mouth two times a day 10)  Prilosec Otc 20 Mg Tbec (Omeprazole magnesium) .... Take 1 tablet by mouth once a day 11)  Tretinoin 0.05 % Crea (Tretinoin) .... Apply to face/body at bedtime as needed 12)   Methotrexate Tabs (Methotrexate sodium tabs) .... Take 41m by mouth every friday 13)  Folic Acid 1 Mg Tabs (Folic acid) .... Take 1 tablet by mouth once a day 14)  Prednisone 2.5 Mg Tabs (Prednisone) .... Take 1 tablet by mouth two times a day 15)  Lamisil At 1 % Crea (Terbinafine hcl) .... Apply to both feet at bedtime for fungal infection 16)  Constulose 10 Gm/1368mSoln (Lactulose) .... Take 15 ml by mouth 5 days a week at bedtime for constipation 17)  Guaifenesin 400 Mg Tabs (Guaifenesin) .... Take 1 tablet every 8 hour as needed for cough. 18)  Doxycycline Hyclate 100 Mg Caps (Doxycycline hyclate) .... One 2 times a day for 7 days as needed for boils and skin ulcers 19)  Prednisone 10 Mg Tabs (Prednisone) .... Take 6 tabs for 2 days, 5 tab for 2 days, 4 tab for 2 days, 3 tab for 2 days, 2 tab for 2 days, and 1 tab for 2 days then stop 20)  Cheratussin Ac 100-10 Mg/68m7myrp (Guaifenesin-codeine) ....Marland Kitchen1-2 tsp every 6 hours for cough 21)  Vitamin E 25000 Unit Oil (Vitamin e) .... Please apply to burn lesions on left hand twice daily. 22)  Debrox 6.5 % Soln (Carbamide peroxide) .... Please place drops in both ears as directed  Patient Instructions: 1)  Please follow up on Friday. 2)  Please take nebulizer breathing treatments every 4 hours for one day and then twice daily for day 2.  3)  Please use Debrox Ear wax remover for both ears. Place drops in both ears to moisten wax. 4)  Please take doxycycline 100 mg by mouth two times a day for 7 days. 5)  Please take Prednisone Taper as directed. Please take 6 tabs for first 2 days, 5 tabs for next two days, 4 tabs for 2 days, 3 tabs for 2 days, 2 tabs for 2 days and then 1 tab for 2 days.  6)  Please take Cheratussin cough medicine. 1-2 tsp every 6 hours for cough.  7)  Please apply vitamin E oil to skin lesions on left hand twice daily until healed.  Prescriptions: DOXYCYCLINE HYCLATE 100 MG CAPS (DOXYCYCLINE HYCLATE) one 2 times a day for 7 days  as needed for boils and skin ulcers  #14 x 3   Entered and Authorized by:   AmaJolene Provost   Signed by:   AmaJolene Provost on 02/23/2010   Method used:   Print then  Give to Patient   RxID:   [id number] DEBROX 6.5 % SOLN (CARBAMIDE PEROXIDE) Please place drops in both ears as directed  #1 bottle x 0   Entered and Authorized by:   [name]   Signed by:   [name] on 02/23/2010   Method used:   Print then Give to Patient   RxID:   [id number] VITAMIN E 93235 UNIT OIL (VITAMIN E) Please apply to burn lesions on left hand twice daily.  #1 bottle x 0   Entered and Authorized by:   [name]   Signed by:   [name] on 02/23/2010   Method used:   Print then Give to Patient   RxID:   [id number] CHERATUSSIN AC 100-10 MG/5ML SYRP (GUAIFENESIN-CODEINE) 1-2 tsp every 6 hours for cough  #1 bottle x 0   Entered and Authorized by:   [name]   Signed by:   [name] on 02/23/2010   Method used:   Print then Give to Patient   RxID:   [id number] PREDNISONE 10 MG TABS (PREDNISONE) Take 6 tabs for 2 days, 5 tab for 2 days, 4 tab for 2 days, 3 tab for 2 days, 2 tab for 2 days, and 1 tab for 2 days then stop  #45 x 0   Entered and Authorized by:   [name]   Signed by:   [name] on 02/23/2010   Method used:   Print then Give to Patient   RxID:   [id number]   Prevention & Chronic Care Immunizations   Influenza vaccine: Fluvax Non-MCR  (01/29/2010)   Influenza vaccine deferral: Deferred  (03/12/2009)    Tetanus booster: Not documented    Pneumococcal vaccine: Pneumovax  (03/26/2006)  Other Screening   Pap smear: Not documented   Pap smear action/deferral: Not indicated S/P hysterectomy  (01/29/2010)    Mammogram: Not documented   Mammogram action/deferral: Ordered  (01/31/2009)   Smoking status: never  (02/23/2010)  Lipids   Total Cholesterol: 164  (01/29/2010)   Lipid panel action/deferral: Lipid  Panel ordered   LDL: 98  (01/29/2010)   LDL Direct: Not documented   HDL: 49  (01/29/2010)   Triglycerides: 86  (01/29/2010)     Medication Administration  Injection # 1:    Medication: Solumedrol      Diagnosis: COUGH (ICD-786.2)    Route: IM    Site: L deltoid    Exp Date: 06/2011    Lot #: [id number]    Mfr: NOVAPLUS    Comments: 40 mg given per Dr.Rylan Kaufmann    Patient tolerated injection without complications    Given by: Morrison Old RN (February 23, 2010 5:06 PM)  Orders Added: 1)  CXR- 2view [CXR] 2)  Est. Patient Level III [09381] 3)  Admin of Therapeutic Inj  intramuscular or subcutaneous [82993]

## 2010-06-30 NOTE — Miscellaneous (Signed)
Summary: Pulmonary function test   Pulmonary Function Test Date: 06/13/2009 Height (in.): 60 Gender: Female  Pre-Spirometry FVC    Value: 1.51 L/min   Pred: 3.09 L/min     % Pred: 49 % FEV1    Value: 0.92 L     Pred: 2.41 L     % Pred: 38 % FEV1/FVC  Value: 61 %     Pred: 77 %     % Pred: . % FEF 25-75  Value: 0.38 L/min   Pred: 2.95 L/min     % Pred: 13 %  Post-Spirometry FVC    Value: 1.31 L/min   Pred: 3.09 L/min     % Pred: 42 % FEV1    Value: 0.72 L     Pred: 2.41 L     % Pred: 30 % FEV1/FVC  Value: 55 %     Pred: 77 %     % Pred: . 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  Patient had difficulty understanding testing instructions, and was unable to perform lung volume or diffusion capacity testing.  Difficult to accurately interpret results due to testing limitations. 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  Patient had difficulty understanding testing instructions, and was unable to perform lung volume or diffusion capacity testing.  Difficult to accurately interpret results due to testing limitations. (06/13/2009 16:12) Added new observation of FEF2575%EXPS: 12 % (06/13/2009 16:12) Added new observation of PSTFEF25/75P: 2.95  (06/13/2009 16:12) Added new observation of PSTFEF25/75%: 0.35 L/min (06/13/2009 16:12) Added new observation of PSTFEV1/FCV%: . % (06/13/2009 16:12) Added new observation of FEV1FVCPRDPS: 77 % (06/13/2009 16:12) Added new observation of PSTFEV1/FVC: 55 % (06/13/2009 16:12) Added new observation of POSTFEV1%PRD: 30 % (06/13/2009 16:12) Added new observation of FEV1PRDPST: 2.41 L (06/13/2009 16:12) Added new observation of POST FEV1: 0.72 L/min (06/13/2009 16:12) Added new observation of POST FVC%EXP: 42 % (06/13/2009 16:12) Added new observation of FVCPRDPST: 3.09  L/min (06/13/2009 16:12) Added new observation of POST FVC: 1.31 L (06/13/2009 16:12) Added new observation of FEF % EXPEC: 13 % (06/13/2009 16:12) Added new observation of FEF25-75%PRE: 2.95 L/min (06/13/2009 16:12) Added new observation of FEF 25-75%: 0.38 L/min (06/13/2009 16:12) Added new observation of FEV1/FVC%EXP: . % (06/13/2009 16:12) Added new observation of FEV1/FVC PRE: 77 % (06/13/2009 16:12) Added new observation of FEV1/FVC: 61 % (06/13/2009 16:12) Added new observation of FEV1 % EXP: 38 % (06/13/2009 16:12) Added new observation of FEV1 PREDICT: 2.41 L (06/13/2009 16:12) Added new observation of FEV1: 0.92 L (06/13/2009 16:12) Added new observation of FVC % EXPECT: 49 % (06/13/2009 16:12) Added new observation of FVC PREDICT: 3.09 L (06/13/2009 16:12) Added new observation of FVC: 1.51 L (06/13/2009 16:12) Added new observation of PFT HEIGHT: 60  (06/13/2009 16:12) Added new observation of PFT DATE: 06/13/2009  (06/13/2009 16:12)

## 2010-06-30 NOTE — Assessment & Plan Note (Signed)
Summary: EST-CK/FU/MEDS/CFB   Vital Signs:  Patient profile:   40 year old female Height:      57 inches (144.78 cm) Weight:      173.0 pounds (78.64 kg) BMI:     37.57 O2 Sat:      95 % on Room air Temp:     97.4 degrees F oral Pulse (ortho):   125 / minute BP standing:   112 / 79  Vitals Entered By: Morrison Old RN (June 03, 2009 2:25 PM)  O2 Flow:  Room air CC: D/c from [place]. Sunday w/bronchitis  and asthma. c/o dizziness.  Coughing and chest discomfort. Check ears. Is Patient Diabetic? No Pain Assessment Patient in pain? no      Nutritional Status BMI of > 30 = obese  Have you ever been in a relationship where you felt threatened, hurt or afraid?No   Does patient need assistance? Functional Status Self care, Cook/clean, Shopping Ambulation Impaired:Risk for fall   Immunization History:  Influenza Immunization History:    Influenza:  historical (05/31/2009)   Serial Vital Signs/Assessments:  Time      Position  BP       Pulse  Resp  Temp     By 2:16 PM   Lying RA  134/93   110                   Morrison Old RN 2:16 PM   Sitting   119/86   112                   Morrison Old RN 2:16 PM   Standing  112/79   125                   Morrison Old RN  Comments: 2:16 PM O2 sat 95- 97%. By: Morrison Old RN    CC:  D/c from [place]. Sunday w/bronchitis  and asthma. c/o dizziness.  Coughing and chest discomfort. Check ears..  History of Present Illness: Ms. Steele is a 40 yo lady with PMH significant for Down Syndrome, Ulcerative Colitis, SLE, RA, and Asthma presents to the clinic for:  1. Cold like symptoms: Pt was recently admitted to [facility] for asthma exacerbation and was discharged 06/01/2009. Pt was in the hospital for 1 week, treated with scheduled nebs, steroids and abx. Pt is still coughing, productive sputum, clear in colour. She has no fever, chills, shortness of breath or sore throat, runny nose. She does  report some chest pain due to coughing. Pt is present with her mother, and pt's mother is requesting a Pulmonology referral as patient has had multiple asthma exacerbations in the past.  2. Oral Thrush - developed after onset of inhalers  Preventive Screening-Counseling & Management  Alcohol-Tobacco     Alcohol drinks/day: 0     Smoking Status: never  Caffeine-Diet-Exercise     Does Patient Exercise: no  Current Medications (verified): 1)  Levothroid 150 Mcg Tabs (Levothyroxine Sodium) .... Take 1 Tablet By Mouth Once A Day 2)  Aspirin 81 Mg  Tbec (Aspirin) .... Take 1 Tablet By Mouth Once A Day 3)  Calcium-Vitamin D 500-200 Mg-Unit Tabs (Calcium Carbonate-Vitamin D) .... Take 1 Tablet By Mouth Two Times A Day 4)  Theragran Tabs (Multiple Vitamin) .... Take 1 Tablet By Mouth Once A Day 5)  Alendronate Sodium 70 Mg Tabs (Alendronate Sodium) .... Take 1 Tablet By Mouth Once A Week. 6)  Asacol  400 Mg Tbec (Mesalamine) .... Take 4 Tablets By Mouth Two Times A Day 7)  Xopenex  Nebu (Levalbuterol Hcl Nebu) .... Inhale Via Nebulizer Two Times A Day As Needed 8)  Prilosec Otc 20 Mg  Tbec (Omeprazole Magnesium) .... Take 1 Tablet By Mouth Once A Day 9)  Tretinoin 0.05 % Crea (Tretinoin) .... Apply To Face/body At Bedtime As Needed 10)  Methotrexate  Tabs (Methotrexate Sodium Tabs) .... Take 51m By Mouth Every Friday 11)  Folic Acid 1 Mg Tabs (Folic Acid) .... Take 1 Tablet By Mouth Once A Day 12)  Albuterol 90 Mcg/act  Aers (Albuterol) .... Inhale 2 Puffs Every 4 Hours As Needed For Shortness of Breath 13)  Prednisone 2.5 Mg  Tabs (Prednisone) .... Take 1 Tablet By Mouth Two Times A Day 14)  Lamisil At 1 % Crea (Terbinafine Hcl) .... Apply To Both Feet At Bedtime For Fungal Infection 15)  Constulose 10 Gm/164mSoln (Lactulose) .... Take 15 Ml By Mouth 5 Days A Week At Bedtime For Constipation 16)  Prednisone 10 Mg Tabs (Prednisone) .... Taper From 80 Mg, Decrease By 10 Mg Daily 17)  Advair  Diskus 250-50 Mcg/dose Aepb (Fluticasone-Salmeterol) .... Take 1 Puff Twice A Day 18)  Spiriva Handihaler 18 Mcg Caps (Tiotropium Bromide Monohydrate) .... Take 1 Puff By Mouth Everyday 19)  Cephalexin 500 Mg Caps (Cephalexin) .... Take 2 Tablets Twice A Day For 7 Days.  Allergies (verified): No Known Drug Allergies  Past History:  Past Medical History: Last updated: 07/18/2008 Anemia-NOS, hx of Asthma GERD Hypothyroidism Rheumatoid arthritis, seronegative, Dr. KeJefm Bryantcne Systemic lupus erythmatosus Down's sydrome (Trisomy 21) Ulcerative colitis Mental retardation Long-term steroid use Osteopenia Macrocytosis Previous fracture of right foot Previous rib fractures Previous fracture of distal right fibula  Past Surgical History: Last updated: 03/18/2006 Hysterectomy B/L foot surgery  Social History: Last updated: 07/18/2008 Single Never Smoked Alcohol use-no Drug use-no Lives in a Group Home  Risk Factors: Alcohol Use: 0 (06/03/2009) Exercise: no (06/03/2009)  Risk Factors: Smoking Status: never (06/03/2009)  Review of Systems General:  Denies chills, fever, and loss of appetite. ENT:  Denies decreased hearing, nasal congestion, and sore throat. CV:  Denies leg cramps with exertion, lightheadness, swelling of feet, and swelling of hands. Resp:  Complains of chest discomfort, chest pain with inspiration, cough, and sputum productive; denies coughing up blood. GI:  Complains of change in bowel habits and diarrhea; denies vomiting. GU:  Denies dysuria, urinary frequency, and urinary hesitancy. MS:  Denies joint pain and joint swelling.  Physical Exam  General:  alert.   Head:  normocephalic and atraumatic.   Eyes:  vision grossly intact, pupils equal, pupils round, and pupils reactive to light.   Ears:  R cerumen impaction and L Cerumen impaction.   Nose:  no external deformity.   Mouth:  good dentition and white plaque(s).   Neck:  supple, full ROM, and  no masses.   Lungs:  normal respiratory effort, no intercostal retractions, no accessory muscle use, normal breath sounds, no dullness, no fremitus, and no wheezes.   Heart:  normal rate, regular rhythm, no murmur, no gallop, no rub, and no JVD.   Abdomen:  soft, non-tender, normal bowel sounds, no distention, no masses, no guarding, and no rigidity.   Msk:  normal ROM.   Pulses:  R radial normal and L radial normal.   Extremities:  no C/C/E Neurologic:  alert & oriented X3, cranial nerves II-XII intact, and strength normal in all  extremities.   Psych:  normally interactive and good eye contact.     Impression & Recommendations:  Problem # 1:  ASTHMA (ICD-493.90) Pt has had multiple admissions for asthma exacerbations. Will obtain pulmonary function tests, and refer patient to pulmonologist.Concern for vascular process as patient also has RA and SLE.  Will continue with prednisone taper and abx regimen.   Her updated medication list for this problem includes:    Xopenex Nebu (Levalbuterol hcl nebu) ..... Inhale via nebulizer two times a day as needed    Albuterol 90 Mcg/act Aers (Albuterol) ..... Inhale 2 puffs every 4 hours as needed for shortness of breath    Prednisone 2.5 Mg Tabs (Prednisone) .Marland Kitchen... Take 1 tablet by mouth two times a day    Prednisone 10 Mg Tabs (Prednisone) .Marland Kitchen... Taper from 80 mg, decrease by 10 mg daily    Advair Diskus 250-50 Mcg/dose Aepb (Fluticasone-salmeterol) .Marland Kitchen... Take 1 puff twice a day    Spiriva Handihaler 18 Mcg Caps (Tiotropium bromide monohydrate) .Marland Kitchen... Take 1 puff by mouth everyday  Pulmonary Functions Reviewed: O2 sat: 95 (06/03/2009)  Orders: Pulmonary Referral (Pulmonary)  Problem # 2:  CANDIDIASIS, ORAL (ICD-112.0) Assessment: New Likely secondary to use of inhalers. Will start patient on Nystatin swish and swallow. Have also advised patient rinse mouth out after using inhalers.   Problem # 3:  SYSTEMIC LUPUS ERYTHEMATOSUS  (ICD-710.0) Assessment: Comment Only Stable, no recent flares. Will check liver function tests as patient is currently on methotrexate.   Her updated medication list for this problem includes:    Aspirin 81 Mg Tbec (Aspirin) .Marland Kitchen... Take 1 tablet by mouth once a day    Methotrexate Tabs (Methotrexate sodium tabs) .Marland Kitchen... Take 78m by mouth every friday    Prednisone 2.5 Mg Tabs (Prednisone) ..Marland Kitchen.. Take 1 tablet by mouth two times a day    Prednisone 10 Mg Tabs (Prednisone) ..Marland Kitchen.. Taper from 80 mg, decrease by 10 mg daily  Orders: T-CBC No Diff (([id number] T-Comprehensive Metabolic Panel ([id number] Pulmonary Referral (Pulmonary)  Complete Medication List: 1)  Levothroid 150 Mcg Tabs (Levothyroxine sodium) .... Take 1 tablet by mouth once a day 2)  Aspirin 81 Mg Tbec (Aspirin) .... Take 1 tablet by mouth once a day 3)  Calcium-vitamin D 500-200 Mg-unit Tabs (Calcium carbonate-vitamin d) .... Take 1 tablet by mouth two times a day 4)  Theragran Tabs (Multiple vitamin) .... Take 1 tablet by mouth once a day 5)  Alendronate Sodium 70 Mg Tabs (Alendronate sodium) .... Take 1 tablet by mouth once a week. 6)  Asacol 400 Mg Tbec (Mesalamine) .... Take 4 tablets by mouth two times a day 7)  Xopenex Nebu (Levalbuterol hcl nebu) .... Inhale via nebulizer two times a day as needed 8)  Prilosec Otc 20 Mg Tbec (Omeprazole magnesium) .... Take 1 tablet by mouth once a day 9)  Tretinoin 0.05 % Crea (Tretinoin) .... Apply to face/body at bedtime as needed 10)  Methotrexate Tabs (Methotrexate sodium tabs) .... Take 164mby mouth every friday 11)  Folic Acid 1 Mg Tabs (Folic acid) .... Take 1 tablet by mouth once a day 12)  Albuterol 90 Mcg/act Aers (Albuterol) .... Inhale 2 puffs every 4 hours as needed for shortness of breath 13)  Prednisone 2.5 Mg Tabs (Prednisone) .... Take 1 tablet by mouth two times a day 14)  Lamisil At 1 % Crea (Terbinafine hcl) .... Apply to both feet at bedtime for fungal  infection 15)  Constulose 10 Gm/1530moln (  Lactulose) .... Take 15 ml by mouth 5 days a week at bedtime for constipation 16)  Prednisone 10 Mg Tabs (Prednisone) .... Taper from 80 mg, decrease by 10 mg daily 17)  Advair Diskus 250-50 Mcg/dose Aepb (Fluticasone-salmeterol) .... Take 1 puff twice a day 18)  Spiriva Handihaler 18 Mcg Caps (Tiotropium bromide monohydrate) .... Take 1 puff by mouth everyday 19)  Cephalexin 500 Mg Caps (Cephalexin) .... Take 2 tablets twice a day for 7 days.  Patient Instructions: 1)  Please schedule a follow-up appointment in 1 month. 2)  Please follow up with Pulmonologist for pulmonary function tests.  Process Orders Check Orders Results:     [facility]: ABN not required for this insurance Tests Sent for requisitioning (June 03, 2009 5:55 PM):     06/03/2009: [facility] -- T-CBC No Diff [[id number]] (signed)     06/03/2009: [facility] -- T-Comprehensive Metabolic Panel [[id number]] (signed)    Prevention & Chronic Care Immunizations   Influenza vaccine: Historical  (05/31/2009)   Influenza vaccine deferral: Deferred  (03/12/2009)    Tetanus booster: Not documented    Pneumococcal vaccine: Pneumovax  (03/26/2006)  Other Screening   Pap smear: Not documented    Mammogram: Not documented   Mammogram action/deferral: Ordered  (01/31/2009)   Smoking status: never  (06/03/2009)  Lipids   Total Cholesterol: 181  (01/31/2009)   LDL: 112  (01/31/2009)   LDL Direct: Not documented   HDL: 50  (01/31/2009)   Triglycerides: 95  (01/31/2009)   Nursing Instructions: Wax impaction removal   Process Orders Check Orders Results:     [facility]: ABN not required for this insurance Tests Sent for requisitioning (June 03, 2009 5:55 PM):     06/03/2009: [facility] -- T-CBC No Diff [[id number]] (signed)     06/03/2009: [facility] --  T-Comprehensive Metabolic Panel [[id number]] (signed)

## 2010-06-30 NOTE — Assessment & Plan Note (Signed)
Summary: EST-CK/FU/MEDS/CFB   Vital Signs:  Patient profile:   40 year old female Height:      57 inches (144.78 cm) Weight:      172.4 pounds (78.36 kg) BMI:     37.44 Temp:     97.5 degrees F oral Pulse rate:   62 / minute BP sitting:   112 / 76  (right arm)  Vitals Entered By: Morrison Old RN (January 29, 2010 9:43 AM) CC: Physical. Is Patient Diabetic? No Pain Assessment Patient in pain? no      Nutritional Status BMI of > 30 = obese  Have you ever been in a relationship where you felt threatened, hurt or afraid?Unable to ask;someone w/pt.   Does patient need assistance? Functional Status Self care, Cook/clean, Shopping Ambulation Normal   Primary Care Provider:  [name]  CC:  Physical..  History of Present Illness: 40 year old with Past Medical History: Anemia-NOS, hx of Asthma, GERD, Hypothyroidism Rheumatoid arthritis, seronegative, Dr. Jefm Bryant, Systemic lupus erythmatosus, Down's sydrome (Trisomy 21), Ulcerative colitis, Long-term steroid use, Osteopenia, Macrocytosis is here for general check up.   Is here with caregiver. No new complaints today. Patient is feeling great.   Depression History:      The patient denies a depressed mood most of the day and a diminished interest in her usual daily activities.         Preventive Screening-Counseling & Management  Alcohol-Tobacco     Alcohol drinks/day: 0     Smoking Status: never  Caffeine-Diet-Exercise     Does Patient Exercise: no  Current Medications (verified): 1)  Symbicort 160-4.5 Mcg/act Aero (Budesonide-Formoterol Fumarate) .... Two Puffs Two Times A Day 2)  Spiriva Handihaler 18 Mcg Caps (Tiotropium Bromide Monohydrate) .... Take 1 Puff By Mouth Everyday 3)  Xopenex  Nebu (Levalbuterol Hcl Nebu) .... Inhale Via Nebulizer Two Times A Day As Needed 4)  Levothroid 150 Mcg Tabs (Levothyroxine Sodium) .... Take 1 Tablet By Mouth Once A Day 5)  Aspirin 81 Mg  Tbec (Aspirin) .... Take 1  Tablet By Mouth Once A Day 6)  Calcium-Vitamin D 500-200 Mg-Unit Tabs (Calcium Carbonate-Vitamin D) .... Take 1 Tablet By Mouth Two Times A Day 7)  Theragran Tabs (Multiple Vitamin) .... Take 1 Tablet By Mouth Once A Day 8)  Alendronate Sodium 70 Mg Tabs (Alendronate Sodium) .... Take 1 Tablet By Mouth Once A Week. 9)  Asacol 400 Mg Tbec (Mesalamine) .... Take 4 Tablets By Mouth Two Times A Day 10)  Prilosec Otc 20 Mg  Tbec (Omeprazole Magnesium) .... Take 1 Tablet By Mouth Once A Day 11)  Tretinoin 0.05 % Crea (Tretinoin) .... Apply To Face/body At Bedtime As Needed 12)  Methotrexate  Tabs (Methotrexate Sodium Tabs) .... Take 72m By Mouth Every Friday 13)  Folic Acid 1 Mg Tabs (Folic Acid) .... Take 1 Tablet By Mouth Once A Day 14)  Prednisone 2.5 Mg  Tabs (Prednisone) .... Take 1 Tablet By Mouth Two Times A Day 15)  Lamisil At 1 % Crea (Terbinafine Hcl) .... Apply To Both Feet At Bedtime For Fungal Infection 16)  Constulose 10 Gm/175mSoln (Lactulose) .... Take 15 Ml By Mouth 5 Days A Week At Bedtime For Constipation 17)  Guaifenesin 400 Mg Tabs (Guaifenesin) .... Take 1 Tablet Every 8 Hour As Needed For Cough. 18)  Doxycycline Hyclate 100 Mg Caps (Doxycycline Hyclate) .... One 2 Times A Day For 7 Days As Needed For Boils and Skin Ulcers  Allergies (verified): No Known Drug Allergies  Past History:  Past Medical History: Last updated: 06/17/2009 Anemia-NOS, hx of Asthma GERD Hypothyroidism Rheumatoid arthritis, seronegative, Dr. Jefm Bryant Acne Systemic lupus erythmatosus Down's sydrome (Trisomy 21) Ulcerative colitis Mental retardation Long-term steroid use Osteopenia Macrocytosis Previous fracture of right foot Previous rib fractures Previous fracture of distal right fibula  Past Surgical History: Last updated: 03/18/2006 Hysterectomy B/L foot surgery  Family History: Last updated: 06/17/2009 Heart disease---mother  Social History: Last updated:  07/18/2008 Single Never Smoked Alcohol use-no Drug use-no Lives in a Group Home  Risk Factors: Alcohol Use: 0 (01/29/2010) Exercise: no (01/29/2010)  Risk Factors: Smoking Status: never (01/29/2010)  Review of Systems      See HPI  Physical Exam  General:  alert and well-developed.   Head:  normocephalic and atraumatic.   Eyes:  vision grossly intact, pupils equal, pupils round, and pupils reactive to light.   Mouth:  pharynx pink and moist.   Neck:  supple, full ROM, no masses, and no thyromegaly.   Lungs:  normal respiratory effort, no accessory muscle use, normal breath sounds, and no wheezes.   Heart:  normal rate, regular rhythm, no murmur, no gallop, and no rub.   Abdomen:  soft, non-tender, and normal bowel sounds.   Msk:  normal ROM, no joint tenderness, no joint swelling, and no joint warmth.   Pulses:  pulses equal bilaterally Extremities:  no edema  Neurologic:  alert & oriented X3.   Skin:  no rashes, no suspicious lesions, no ulcerations, and no edema.   Psych:  Oriented X3, normally interactive, and good eye contact.     Impression & Recommendations:  Problem # 1:  BOILS, RECURRENT (ICD-680.9) Assessment Comment Only Patient currently does not have any boils or skin lesions. Patient is susceptible to skin ulcerations and boils as she is chronic immunosuppressant therapy including prednisone. Will continue to monitor closely. Patient's caregiver states that there are nurses in the group home and they check for MRSA, when she does have skin boil.   Problem # 2:  HYPOTHYROIDISM (ICD-244.9) Assessment: Comment Only  No new complaints or symptoms related to hypothyroidism. Last TSH checked in 2010, thus will check level today and adjust synthroid accordingly if needed.   Her updated medication list for this problem includes:    Levothroid 150 Mcg Tabs (Levothyroxine sodium) .Marland Kitchen... Take 1 tablet by mouth once a day  Orders: T-TSH ([id number])  Labs  Reviewed: TSH: 0.356 (01/31/2009)    Chol: 181 (01/31/2009)   HDL: 50 (01/31/2009)   LDL: 112 (01/31/2009)   TG: 95 (01/31/2009)  Problem # 3:  Preventive Health Care (ICD-V70.0) Assessment: Comment Only Flu shot today. Lipid panel. Patient's mother does not want patient to have PAP done.   Complete Medication List: 1)  Symbicort 160-4.5 Mcg/act Aero (Budesonide-formoterol fumarate) .... Two puffs two times a day 2)  Spiriva Handihaler 18 Mcg Caps (Tiotropium bromide monohydrate) .... Take 1 puff by mouth everyday 3)  Xopenex Nebu (Levalbuterol hcl nebu) .... Inhale via nebulizer two times a day as needed 4)  Levothroid 150 Mcg Tabs (Levothyroxine sodium) .... Take 1 tablet by mouth once a day 5)  Aspirin 81 Mg Tbec (Aspirin) .... Take 1 tablet by mouth once a day 6)  Calcium-vitamin D 500-200 Mg-unit Tabs (Calcium carbonate-vitamin d) .... Take 1 tablet by mouth two times a day 7)  Theragran Tabs (Multiple vitamin) .... Take 1 tablet by mouth once a day 8)  Alendronate Sodium 70 Mg  Tabs (Alendronate sodium) .... Take 1 tablet by mouth once a week. 9)  Asacol 400 Mg Tbec (Mesalamine) .... Take 4 tablets by mouth two times a day 10)  Prilosec Otc 20 Mg Tbec (Omeprazole magnesium) .... Take 1 tablet by mouth once a day 11)  Tretinoin 0.05 % Crea (Tretinoin) .... Apply to face/body at bedtime as needed 12)  Methotrexate Tabs (Methotrexate sodium tabs) .... Take 1m by mouth every friday 13)  Folic Acid 1 Mg Tabs (Folic acid) .... Take 1 tablet by mouth once a day 14)  Prednisone 2.5 Mg Tabs (Prednisone) .... Take 1 tablet by mouth two times a day 15)  Lamisil At 1 % Crea (Terbinafine hcl) .... Apply to both feet at bedtime for fungal infection 16)  Constulose 10 Gm/1103mSoln (Lactulose) .... Take 15 ml by mouth 5 days a week at bedtime for constipation 17)  Guaifenesin 400 Mg Tabs (Guaifenesin) .... Take 1 tablet every 8 hour as needed for cough. 18)  Doxycycline Hyclate 100 Mg Caps  (Doxycycline hyclate) .... One 2 times a day for 7 days as needed for boils and skin ulcers  Other Orders: T-Lipid Profile ([id number] Vaccine NON MCR ([phone] Patient Instructions: 1)  Please schedule a follow-up appointment in 3 months. Prescriptions: DOXYCYCLINE HYCLATE 100 MG CAPS (DOXYCYCLINE HYCLATE) one 2 times a day for 7 days as needed for boils and skin ulcers  #14 x 3   Entered and Authorized by:   AmJolene ProvostD   Signed by:   AmJolene ProvostD on 01/29/2010   Method used:   Print then Give to Patient   RxID:   [id number] Prevention & Chronic Care Immunizations   Influenza vaccine: Fluvax Non-MCR  (01/29/2010)   Influenza vaccine deferral: Deferred  (03/12/2009)    Tetanus booster: Not documented    Pneumococcal vaccine: Pneumovax  (03/26/2006)  Other Screening   Pap smear: Not documented   Pap smear action/deferral: Not indicated S/P hysterectomy  (01/29/2010)    Mammogram: Not documented   Mammogram action/deferral: Ordered  (01/31/2009)  Reports requested:   Last mammogram report requested.  Smoking status: never  (01/29/2010)  Lipids   Total Cholesterol: 181  (01/31/2009)   Lipid panel action/deferral: Lipid Panel ordered   LDL: 112  (01/31/2009)   LDL Direct: Not documented   HDL: 50  (01/31/2009)   Triglycerides: 95  (01/31/2009)   Nursing Instructions: Give Flu vaccine today Request report of last mammogram   Process Orders Check Orders Results:     [facility]: ABN not required for this insurance Tests Sent for requisitioning (January 29, 2010 12:30 PM):     01/29/2010: [facility] -- T-Lipid Profile [[phone])     01/29/2010: [facility] -- T-TSH [[id number])      Influenza Vaccine    Vaccine Type: Fluvax Non-MCR    Site: left deltoid    Mfr: GlaxoSmithKline    Dose: 0.5 ml    Route: IM    Given by: GlMorrison OldN    Exp. Date:  11/28/2010    Lot #: [id number]  VIS given: 12/23/09 version given January 29, 2010.  Flu Vaccine Consent Questions    Do you have a history of severe allergic reactions to this vaccine? no    Any prior history of allergic reactions to egg and/or gelatin? no    Do you have a sensitivity to the preservative Thimersol? no  Do you have a past history of Guillan-Barre Syndrome? no    Do you currently have an acute febrile illness? no    Have you ever had a severe reaction to latex? no    Vaccine information given and explained to patient? yes    Are you currently pregnant? no

## 2010-06-30 NOTE — Assessment & Plan Note (Signed)
Summary: asthma/ mbw   Copy to:  Dr. Luane [facility] Primary Provider/Referring Provider:  [name]  CC:  Pulmonary consult for asthma. The patient c/o chest tightness and cough with yellow mucus..  History of Present Illness: 40 yo female for evaluation of asthma.  She was treatment in [place] for an asthma exacerbation around Christmas.  She has trouble with a cough with yellow sputum.  She denies hemoptysis.  She does get wheeze and chest tightness.  She was recently started on advair and spiriva, and this has helped.  She has been getting sinus congestion with post-nasal drip.  She has a history of ulcerative colitis, rheumatoid arthritis, and lupus.  She is chronically on prednisone.  She was on methotrexate, but had to stop due to increased liver enzymes. This was restarted again.  She has a nebulizer, but is not using it much.  She has been doing better since she was in the hospital.  She does have trouble with her sleep.  She does snore, and stops breathing while asleep.  She has trouble staying awake during the day.  She has several prior episodes of pneumonia.  There is no history of tobacco use, or TB.  She does not have any animal exposures, or travel history.  Her PFT from June 13, 2009 showed severe obstruction.  However, she had difficulty performing the tests measures limiting the ability to interpret the test accurately.  Current Medications (verified): 1)  Levothroid 150 Mcg Tabs (Levothyroxine Sodium) .... Take 1 Tablet By Mouth Once A Day 2)  Aspirin 81 Mg  Tbec (Aspirin) .... Take 1 Tablet By Mouth Once A Day 3)  Calcium-Vitamin D 500-200 Mg-Unit Tabs (Calcium Carbonate-Vitamin D) .... Take 1 Tablet By Mouth Two Times A Day 4)  Theragran Tabs (Multiple Vitamin) .... Take 1 Tablet By Mouth Once A Day 5)  Alendronate Sodium 70 Mg Tabs (Alendronate Sodium) .... Take 1 Tablet By Mouth Once A Week. 6)  Asacol 400 Mg Tbec (Mesalamine) .... Take 4 Tablets  By Mouth Two Times A Day 7)  Xopenex  Nebu (Levalbuterol Hcl Nebu) .... Inhale Via Nebulizer Two Times A Day As Needed 8)  Prilosec Otc 20 Mg  Tbec (Omeprazole Magnesium) .... Take 1 Tablet By Mouth Once A Day 9)  Tretinoin 0.05 % Crea (Tretinoin) .... Apply To Face/body At Bedtime As Needed 10)  Methotrexate  Tabs (Methotrexate Sodium Tabs) .... Take 58m By Mouth Every Friday 11)  Folic Acid 1 Mg Tabs (Folic Acid) .... Take 1 Tablet By Mouth Once A Day 12)  Albuterol 90 Mcg/act  Aers (Albuterol) .... Inhale 2 Puffs Every 4 Hours As Needed For Shortness of Breath 13)  Prednisone 2.5 Mg  Tabs (Prednisone) .... Take 1 Tablet By Mouth Two Times A Day 14)  Lamisil At 1 % Crea (Terbinafine Hcl) .... Apply To Both Feet At Bedtime For Fungal Infection 15)  Constulose 10 Gm/193mSoln (Lactulose) .... Take 15 Ml By Mouth 5 Days A Week At Bedtime For Constipation 16)  Prednisone 10 Mg Tabs (Prednisone) .... Taper From 80 Mg, Decrease By 10 Mg Daily 17)  Advair Diskus 250-50 Mcg/dose Aepb (Fluticasone-Salmeterol) .... Take 1 Puff Twice A Day 18)  Spiriva Handihaler 18 Mcg Caps (Tiotropium Bromide Monohydrate) .... Take 1 Puff By Mouth Everyday  Allergies (verified): No Known Drug Allergies  Past History:  Past Medical History: Anemia-NOS, hx of Asthma GERD Hypothyroidism Rheumatoid arthritis, seronegative, Dr. KeJefm Bryantcne Systemic lupus erythmatosus Down's sydrome (Trisomy 21)  Ulcerative colitis Mental retardation Long-term steroid use Osteopenia Macrocytosis Previous fracture of right foot Previous rib fractures Previous fracture of distal right fibula  Past Surgical History: Reviewed history from 03/18/2006 and no changes required. Hysterectomy B/L foot surgery  Family History: Reviewed history and no changes required. Heart disease---mother  Social History: Reviewed history from 07/18/2008 and no changes required. Single Never Smoked Alcohol use-no Drug use-no Lives in  a Group Home  Review of Systems       The patient complains of shortness of breath with activity, productive cough, non-productive cough, chest pain, nasal congestion/difficulty breathing through nose, ear ache, joint stiffness or pain, and change in color of mucus.  The patient denies shortness of breath at rest, coughing up blood, irregular heartbeats, acid heartburn, indigestion, loss of appetite, weight change, abdominal pain, difficulty swallowing, sore throat, tooth/dental problems, headaches, sneezing, itching, anxiety, depression, hand/feet swelling, rash, and fever.    Physical Exam  General:  obese.   Eyes:  PERRLA and EOMI.   Nose:  clear nasal discharge.   Mouth:  MP 3 airway Neck:  no JVD.   Lungs:  diminished breath sounds, no wheeze Heart:  regular rate and rhythm, S1, S2 without murmurs, rubs, gallops, or clicks Abdomen:  obese, soft, non-tender Extremities:  minimal ankle edema Neurologic:  normal strength Cervical Nodes:  no significant adenopathy   Impression & Recommendations:  Problem # 1:  ASTHMA (ICD-493.90) She had severe obstruction on her pulmonary function test, but was unable to do pulmonary function testing maneuvers effectively.  Her symptoms are suggestive of asthma and she may also have COPD.  She has clinical benefit from inhaler therapy.  Will continue her current inhalers.  Will get a copy of her recent chest xray from her hospital stay.  Of note is that she has a history of rheumatoid arthritis, lupus, and ulcerative colitis.  It is possible that she may have lung involvement from any one of this.  Will decide if further investigation of this is needed after review of her recent chest xray.  Problem # 2:  CHRONIC RHINITIS (ICD-472.0) Will see if she can use nasal irrigation and nasonex.  Her sinus difficulties may be contributing to her asthma symptoms.  Depending on her response she may need additional allergy testing.  Problem # 3:  SNORING  (ICD-786.09) She has been noted to have trouble with snoring at night.  She has physical features suggestive of sleep apnea.  This is also commonly associated with Down Syndrome patients.  Advised to monitor her sleep pattern, and determine if further investigation is needed.    Of note is that her recent lab work shows her hemoglobin is slightly elevated.  This can be seen with nocturnal hypoxemia, possibly related to sleep disordered breathing.  Medications Added to Medication List This Visit: 1)  Nasonex 50 Mcg/act Susp (Mometasone furoate) .... Two sprays once daily as needed  Complete Medication List: 1)  Nasonex 50 Mcg/act Susp (Mometasone furoate) .... Two sprays once daily as needed 2)  Spiriva Handihaler 18 Mcg Caps (Tiotropium bromide monohydrate) .... Take 1 puff by mouth everyday 3)  Advair Diskus 250-50 Mcg/dose Aepb (Fluticasone-salmeterol) .... Take 1 puff twice a day 4)  Xopenex Nebu (Levalbuterol hcl nebu) .... Inhale via nebulizer two times a day as needed 5)  Levothroid 150 Mcg Tabs (Levothyroxine sodium) .... Take 1 tablet by mouth once a day 6)  Aspirin 81 Mg Tbec (Aspirin) .... Take 1 tablet by mouth once a day  7)  Calcium-vitamin D 500-200 Mg-unit Tabs (Calcium carbonate-vitamin d) .... Take 1 tablet by mouth two times a day 8)  Theragran Tabs (Multiple vitamin) .... Take 1 tablet by mouth once a day 9)  Alendronate Sodium 70 Mg Tabs (Alendronate sodium) .... Take 1 tablet by mouth once a week. 10)  Asacol 400 Mg Tbec (Mesalamine) .... Take 4 tablets by mouth two times a day 11)  Prilosec Otc 20 Mg Tbec (Omeprazole magnesium) .... Take 1 tablet by mouth once a day 12)  Tretinoin 0.05 % Crea (Tretinoin) .... Apply to face/body at bedtime as needed 13)  Methotrexate Tabs (Methotrexate sodium tabs) .... Take 74m by mouth every friday 14)  Folic Acid 1 Mg Tabs (Folic acid) .... Take 1 tablet by mouth once a day 15)  Albuterol 90 Mcg/act Aers (Albuterol) .... Inhale 2 puffs  every 4 hours as needed for shortness of breath 16)  Prednisone 2.5 Mg Tabs (Prednisone) .... Take 1 tablet by mouth two times a day 17)  Lamisil At 1 % Crea (Terbinafine hcl) .... Apply to both feet at bedtime for fungal infection 18)  Constulose 10 Gm/168mSoln (Lactulose) .... Take 15 ml by mouth 5 days a week at bedtime for constipation 19)  Prednisone 10 Mg Tabs (Prednisone) .... Taper from 80 mg, decrease by 10 mg daily  Other Orders: Consultation Level IV ([id number] Patient Instructions: 1)  Nasal irrigation once daily 2)  Nasonex two sprays once daily as needed  3)  Will get copy of xray reports from recent hospitalization 4)  Follow up in 2 to 3 months Prescriptions: NASONEX 50 MCG/ACT SUSP (MOMETASONE FUROATE) two sprays once daily as needed  #1 x 3   Entered and Authorized by:   ViChesley MiresD   Signed by:   ViChesley MiresD on 06/17/2009   Method used:   Print then Give to Patient   RxID:   [id number]

## 2010-06-30 NOTE — Assessment & Plan Note (Signed)
Summary: [name]   Vital Signs:  Patient profile:   40 year old female Height:      57 inches (144.78 cm) Weight:      162.6 pounds (77.73 kg) BMI:     37.14 Temp:     97.1 degrees F (36.17 degrees C) oral Pulse rate:   68 / minute BP sitting:   109 / 74  (right arm) Cuff size:   regular  Vitals Entered By: [name] (May 04, 2010 8:41 AM) CC: ROUTINE OFFICE VISIT / JOINT PAIN IN HAND Is Patient Diabetic? No Pain Assessment Patient in pain? yes     Location: hand Intensity:          3 Type: stinging Onset of pain  Chronic Nutritional Status BMI of > 30 = obese  Have you ever been in a relationship where you felt threatened, hurt or afraid?No   Does patient need assistance? Functional Status Self care Ambulation Normal Comments ROUTINE OFFICE VIST   Primary Care Provider:  [name]  CC:  ROUTINE OFFICE VISIT / JOINT PAIN IN HAND.  History of Present Illness: 40 year old with Past Medical History: Anemia-NOS, hx of Asthma, GERD, Hypothyroidism Rheumatoid arthritis, seronegative, Dr. Jefm Bryant, Systemic lupus erythmatosus, Down's sydrome (Trisomy 21), Ulcerative colitis, Long-term steroid use, Osteopenia, Macrocytosis is here for follow up for the following:  1) Evaluation of boils on right thigh - there is one boil on the back of the right thigh that is causing some discomfort. She has a history of recurrent boils, and usually applies warm compresses to them. At times, when they are draining, she will be given Doxycycline. Currently, she denies any drainage. She denies fevers and chills.   2) RA - complains that her hands will occasionally be stiff and that currently her thumb is slightly more stiff than usual.   She denies any shortness of breath, cough, chest pain, nausea and vomiting. She does mention that she was hospitalized around thanksgiving due to nausea and vomiting, and required IV hydration. However, she states she is  feeling good today.   Preventive Screening-Counseling & Management  Alcohol-Tobacco     Alcohol drinks/day: 0     Smoking Status: never  Caffeine-Diet-Exercise     Does Patient Exercise: no  Current Medications (verified): 1)  Symbicort 160-4.5 Mcg/act Aero (Budesonide-Formoterol Fumarate) .... Two Puffs Two Times A Day 2)  Spiriva Handihaler 18 Mcg Caps (Tiotropium Bromide Monohydrate) .... Take 1 Puff By Mouth Everyday 3)  Xopenex  Nebu (Levalbuterol Hcl Nebu) .... Inhale Via Nebulizer Two Times A Day As Needed 4)  Levothroid 150 Mcg Tabs (Levothyroxine Sodium) .... Take 1 Tablet By Mouth Once A Day 5)  Aspirin 81 Mg  Tbec (Aspirin) .... Take 1 Tablet By Mouth Once A Day 6)  Calcium-Vitamin D 500-200 Mg-Unit Tabs (Calcium Carbonate-Vitamin D) .... Take 1 Tablet By Mouth Two Times A Day 7)  Theragran Tabs (Multiple Vitamin) .... Take 1 Tablet By Mouth Once A Day 8)  Alendronate Sodium 70 Mg Tabs (Alendronate Sodium) .... Take 1 Tablet By Mouth Once A Week. 9)  Asacol 400 Mg Tbec (Mesalamine) .... Take 4 Tablets By Mouth Two Times A Day 10)  Prilosec Otc 20 Mg  Tbec (Omeprazole Magnesium) .... Take 1 Tablet By Mouth Once A Day 11)  Tretinoin 0.05 % Crea (Tretinoin) .... Apply To Face/body At Bedtime As Needed 12)  Methotrexate  Tabs (Methotrexate Sodium Tabs) .... Take 43m By Mouth  Every Friday 13)  Folic Acid 1 Mg Tabs (Folic Acid) .... Take 1 Tablet By Mouth Once A Day 14)  Prednisone 2.5 Mg  Tabs (Prednisone) .... Take 1 Tablet By Mouth Two Times A Day 15)  Lamisil At 1 % Crea (Terbinafine Hcl) .... Apply To Both Feet At Bedtime For Fungal Infection 16)  Constulose 10 Gm/54m Soln (Lactulose) .... Take 15 Ml By Mouth 5 Days A Week At Bedtime For Constipation 17)  Guaifenesin 400 Mg Tabs (Guaifenesin) .... Take 1 Tablet Every 8 Hour As Needed For Cough. 18)  Doxycycline Hyclate 100 Mg Caps (Doxycycline Hyclate) .... Take 1 Tablet By Mouth Two Times A Day For 7 Days. May Refill and  Take As Needed For Recurrent Skin Boils For 7 Day Course. 19)  Prednisone 10 Mg Tabs (Prednisone) .... Take 4 Tabs For 2 Days, 3 Tabs For 2 Days, 2 Tab For 2 Days, and 1 Tab For 2 Days Then Stop 20)  Cheratussin Ac 100-10 Mg/570mSyrp (Guaifenesin-Codeine) ...Marland Kitchen 1-2 Tsp Po Every 6 Hours For Cough As Needed and One Tsp Scheduled Dose At Bedtime For 6 Days. 21)  Vitamin E 25000 Unit Oil (Vitamin E) .... Please Apply To Burn Lesions On Left Hand Twice Daily. 22)  Debrox 6.5 % Soln (Carbamide Peroxide) .... Please Place Drops in Both Ears As Directed  Allergies (verified): No Known Drug Allergies  Past History:  Past Medical History: Last updated: 06/17/2009 Anemia-NOS, hx of Asthma GERD Hypothyroidism Rheumatoid arthritis, seronegative, Dr. KeJefm Bryantcne Systemic lupus erythmatosus Down's sydrome (Trisomy 21) Ulcerative colitis Mental retardation Long-term steroid use Osteopenia Macrocytosis Previous fracture of right foot Previous rib fractures Previous fracture of distal right fibula  Past Surgical History: Last updated: 03/18/2006 Hysterectomy B/L foot surgery  Family History: Last updated: 06/17/2009 Heart disease---mother  Social History: Last updated: 07/18/2008 Single Never Smoked Alcohol use-no Drug use-no Lives in a Group Home  Risk Factors: Alcohol Use: 0 (05/04/2010) Exercise: no (05/04/2010)  Risk Factors: Smoking Status: never (05/04/2010)  Review of Systems      See HPI  Physical Exam  General:  alert and well-developed.  VS reviewed and wnl Head:  normocephalic and atraumatic.   Ears:  R ear normal and L ear normal.   Nose:  some dryness around nares Neck:  supple.   Lungs:  normal respiratory effort and normal breath sounds.   Heart:  normal rate, regular rhythm, no murmur, no gallop, and no rub.   Abdomen:  soft, non-tender, normal bowel sounds, and no distention.   Extremities:  no edema  Neurologic:  alert & oriented X3.   no focal  neurologic deficits Skin:  small approx 1 cm boil locate on back of right thigh erythematous warmth to touch no drainage or puss tender to palpation    Impression & Recommendations:  Problem # 1:  HYPOTHYROIDISM (ICD-244.9) Labs are up to date. Additionally patient has no concerns or symptoms reflective of hypothyroidism. WIll continue current dose.   Her updated medication list for this problem includes:    Levothroid 150 Mcg Tabs (Levothyroxine sodium) ...Marland Kitchen. Take 1 tablet by mouth once a day  Labs Reviewed: TSH: 0.044 (01/29/2010)    Chol: 164 (01/29/2010)   HDL: 49 (01/29/2010)   LDL: 98 (01/29/2010)   TG: 86 (01/29/2010)  Problem # 2:  BOILS, RECURRENT (ICD-680.9) Advised warm compresses to the area on right thigh. Advised her caregiver, that warm compresses should be sufficient and that she does not need abx at this  time.   Problem # 3:  ULCERATIVE COLITIS (ICD-556.9) Assessment: Comment Only Recent flare in November. Per pt she was hospitalized and received IV hydration. I do not see any ED reports or discharge summary in [facility], so she may have went to a hospital not in the [place]. Nevertheless, she is asymptomatic, and states she has had  no further episodes of nausea or vomiting.   Problem # 4:  ASTHMA (ICD-493.90) Assessment: Comment Only Well controlled, no recent flares. Will continue current regimen.  Her updated medication list for this problem includes:    Symbicort 160-4.5 Mcg/act Aero (Budesonide-formoterol fumarate) .Marland Kitchen..Marland Kitchen Two puffs two times a day    Spiriva Handihaler 18 Mcg Caps (Tiotropium bromide monohydrate) .Marland Kitchen... Take 1 puff by mouth everyday    Xopenex Nebu (Levalbuterol hcl nebu) ..... Inhale via nebulizer two times a day as needed    Prednisone 2.5 Mg Tabs (Prednisone) .Marland Kitchen... Take 1 tablet by mouth two times a day  Problem # 5:  OSTEOPENIA (ICD-733.90) Assessment: Comment Only Also taking calcium and vit D supplements.   Her updated  medication list for this problem includes:    Alendronate Sodium 70 Mg Tabs (Alendronate sodium) .Marland Kitchen... Take 1 tablet by mouth once a week.  Discussed medication use, applications of heat or ice, and exercises.   Complete Medication List: 1)  Symbicort 160-4.5 Mcg/act Aero (Budesonide-formoterol fumarate) .... Two puffs two times a day 2)  Spiriva Handihaler 18 Mcg Caps (Tiotropium bromide monohydrate) .... Take 1 puff by mouth everyday 3)  Xopenex Nebu (Levalbuterol hcl nebu) .... Inhale via nebulizer two times a day as needed 4)  Levothroid 150 Mcg Tabs (Levothyroxine sodium) .... Take 1 tablet by mouth once a day 5)  Aspirin 81 Mg Tbec (Aspirin) .... Take 1 tablet by mouth once a day 6)  Calcium-vitamin D 500-200 Mg-unit Tabs (Calcium carbonate-vitamin d) .... Take 1 tablet by mouth two times a day 7)  Theragran Tabs (Multiple vitamin) .... Take 1 tablet by mouth once a day 8)  Alendronate Sodium 70 Mg Tabs (Alendronate sodium) .... Take 1 tablet by mouth once a week. 9)  Asacol 400 Mg Tbec (Mesalamine) .... Take 4 tablets by mouth two times a day 10)  Prilosec Otc 20 Mg Tbec (Omeprazole magnesium) .... Take 1 tablet by mouth once a day 11)  Tretinoin 0.05 % Crea (Tretinoin) .... Apply to face/body at bedtime as needed 12)  Methotrexate Tabs (Methotrexate sodium tabs) .... Take 37m by mouth every friday 13)  Folic Acid 1 Mg Tabs (Folic acid) .... Take 1 tablet by mouth once a day 14)  Prednisone 2.5 Mg Tabs (Prednisone) .... Take 1 tablet by mouth two times a day 15)  Lamisil At 1 % Crea (Terbinafine hcl) .... Apply to both feet at bedtime for fungal infection 16)  Constulose 10 Gm/118mSoln (Lactulose) .... Take 15 ml by mouth 5 days a week at bedtime for constipation 17)  Guaifenesin 400 Mg Tabs (Guaifenesin) .... Take 1 tablet every 8 hour as needed for cough. 18)  Doxycycline Hyclate 100 Mg Caps (Doxycycline hyclate) .... Take 1 tablet by mouth two times a day for 7 days. may refill and  take as needed for recurrent skin boils for 7 day course. 19)  Cheratussin Ac 100-10 Mg/68m668myrp (Guaifenesin-codeine) ....Marland Kitchen1-2 tsp po every 6 hours for cough as needed and one tsp scheduled dose at bedtime for 6 days. 20)  Vitamin E 25000 Unit Oil (Vitamin e) .... Please  apply to burn lesions on left hand twice daily. 21)  Debrox 6.5 % Soln (Carbamide peroxide) .... Please place drops in both ears as directed 22)  Nasal 0.65 % Soln (Saline) .... Please spray into each nostril, 1-2 sprays every 6 hours as needed for dryness.  Patient Instructions: 1)  Please schedule a follow-up appointment in 3 months. 2)  Please use the nasal spray. Spray 1-2 sprays into each nostril every 6 hours for dryness. 3)  Please apply warm compresses to the areas where boils are, about 15-20 times a day.   Orders Added: 1)  Est. Patient Level III [64158]     Prevention & Chronic Care Immunizations   Influenza vaccine: Fluvax Non-MCR  (01/29/2010)   Influenza vaccine deferral: Deferred  (03/12/2009)    Tetanus booster: Not documented    Pneumococcal vaccine: Pneumovax  (03/26/2006)  Other Screening   Pap smear: Not documented   Pap smear action/deferral: Not indicated S/P hysterectomy  (01/29/2010)    Mammogram: Not documented   Mammogram action/deferral: Deferred  (05/04/2010)   Smoking status: never  (05/04/2010)  Lipids   Total Cholesterol: 164  (01/29/2010)   Lipid panel action/deferral: Lipid Panel ordered   LDL: 98  (01/29/2010)   LDL Direct: Not documented   HDL: 49  (01/29/2010)   Triglycerides: 86  (01/29/2010)

## 2010-06-30 NOTE — Progress Notes (Signed)
Summary: appt/ hla  Phone Note Call from Patient   Summary of Call: felecia at pt's group home calls to say pt has boils under breasts and needs abx, she is getting ready to go on vacation w/ family and group home would like this addressed before she leaves. please advise Initial call taken by: Freddy Finner RN,  January 08, 2010 4:19 PM  Follow-up for Phone Call        Please have patient come in to get boils evaluated.  Follow-up by: [name],  January 09, 2010 12:18 PM

## 2010-06-30 NOTE — Progress Notes (Signed)
Summary: Records received  Records received from [facility]. Forwarded to Dr. Halford Chessman for review. Cecille Rubin made aware. Dena Chavis  June 19, 2009 3:28 PM

## 2010-06-30 NOTE — Progress Notes (Signed)
Summary: toe injury/ hla  Phone Note Call from Patient   Caller: caregiver Summary of Call: group home, felicia calls to say pt banged 4thR toe, it is swollen, black and blue, painful. she is advised to use urg care and will do so. Initial call taken by: Freddy Finner RN,  February 17, 2010 5:15 PM  Follow-up for Phone Call        Noted.  Follow-up by: [name],  February 18, 2010 8:41 AM

## 2010-06-30 NOTE — Letter (Signed)
Summary: [facility]: [facility]  [facility]: FL2   Imported By: Bonner Puna 07/23/2009 15:55:53  _____________________________________________________________________  External Attachment:    Type:   Image     Comment:   External Document

## 2010-06-30 NOTE — Progress Notes (Signed)
Summary: visit, group home, meds/ hla  Phone Note Other Incoming   Summary of Call: pt is in group home, person named caronda, pt's caregiver calls to say pt came back last pm from mother's home bringing filled meds from visit on 9/26 and 9/29, she states the group home cannot use these meds, she doesn't or states she doesn't know how much prednisone the pt has been given and needs a clearer order on the cheratussin... should it be every 6hrs or as needed every 6 hrs. also is pt to have a new script for doxy for the acute bronchitis. says she will need new scripts for all these as she will not be able to use the meds brought in bt pt' s mother. please advise. i have tried to call pt's mother but only got voicemail Initial call taken by: Freddy Finner RN,  February 27, 2010 3:18 PM  Follow-up for Phone Call        Cheratussin is scheduled every 6 hours for cough, for another 2-3 days until cough improved.  Will they come and pick up the scripts or can I re-send them electronically? Prednisone is as follows: 4 tabs today and tomorrow 3 tabs Sun and Mond 2 tabs tues and wed 1 tab thurs Follow-up by: [name],  February 27, 2010 4:07 PM  Additional Follow-up for Phone Call Additional follow up Details #1::        ok scripts done Additional Follow-up by: Rhea Pink  DO,  February 27, 2010 4:28 PM    Additional Follow-up for Phone Call Additional follow up Details #2::    called to pharm, also sent visit notes to group home and informed caronda Follow-up by: Freddy Finner RN,  February 27, 2010 4:45 PM  New/Updated Medications: DOXYCYCLINE HYCLATE 100 MG CAPS (DOXYCYCLINE HYCLATE) Take 1 tablet by mouth two times a day for 7 days. May refill and take as needed for recurrent skin boils for 7 day course. PREDNISONE 10 MG TABS (PREDNISONE) Take 4 tabs for 2 days, 3 tabs for 2 days, 2 tab for 2 days, and 1 tab for 2 days then stop CHERATUSSIN AC 100-10 MG/5ML SYRP  (GUAIFENESIN-CODEINE) 1-2 tsp po every 6 hours for cough as needed and one tsp scheduled dose at bedtime for 6 days. Prescriptions: DOXYCYCLINE HYCLATE 100 MG CAPS (DOXYCYCLINE HYCLATE) Take 1 tablet by mouth two times a day for 7 days. May refill and take as needed for recurrent skin boils for 7 day course.  #14 x 3   Entered and Authorized by:   Rhea Pink  DO   Signed by:   Freddy Finner RN on 02/27/2010   Method used:   Print then Give to Patient   RxID:   [id number] CHERATUSSIN AC 100-10 MG/5ML SYRP (GUAIFENESIN-CODEINE) 1-2 tsp po every 6 hours for cough as needed and one tsp scheduled dose at bedtime for 6 days.  #274m x 0   Entered and Authorized by:   BRhea Pink DO   Signed by:   HFreddy FinnerRN on 02/27/2010   Method used:   Print then Give to Patient   RxID:   [id number] 10 MG TABS (PREDNISONE) Take 4 tabs for 2 days, 3 tabs for 2 days, 2 tab for 2 days, and 1 tab for 2 days then stop  #20 x 0   Entered and Authorized by:   BRhea Pink DO   Signed by:   HFreddy FinnerRN on 02/27/2010  Method used:   Print then Give to Patient   RxID:   [id number]

## 2010-06-30 NOTE — Assessment & Plan Note (Signed)
Summary: rov ///kp   Copy to:  Dr. Luane School Rheumatology Primary Provider/Referring Provider:  [name]  CC:  Asthma follow-up.  The patient c/o hoarseness and sore throat. She says she is rinsing her mouth after inhaler use. She states her breathing may be slightly better.Cindy Oconnor  History of Present Illness: 40 yo female with asthma.  She has not been using her nasal irrigation or nasonex as her sinuses have been doing better.  She still has a sore throat.  She is not having as much cough or wheezing.  She is not needing to use her nebulizer.  She would get jitters when she used albuterol.  Current Medications (verified): 1)  Spiriva Handihaler 18 Mcg Caps (Tiotropium Bromide Monohydrate) .... Take 1 Puff By Mouth Everyday 2)  Advair Diskus 250-50 Mcg/dose Aepb (Fluticasone-Salmeterol) .... Take 1 Puff Twice A Day 3)  Xopenex  Nebu (Levalbuterol Hcl Nebu) .... Inhale Via Nebulizer Two Times A Day As Needed 4)  Levothroid 150 Mcg Tabs (Levothyroxine Sodium) .... Take 1 Tablet By Mouth Once A Day 5)  Aspirin 81 Mg  Tbec (Aspirin) .... Take 1 Tablet By Mouth Once A Day 6)  Calcium-Vitamin D 500-200 Mg-Unit Tabs (Calcium Carbonate-Vitamin D) .... Take 1 Tablet By Mouth Two Times A Day 7)  Theragran Tabs (Multiple Vitamin) .... Take 1 Tablet By Mouth Once A Day 8)  Alendronate Sodium 70 Mg Tabs (Alendronate Sodium) .... Take 1 Tablet By Mouth Once A Week. 9)  Asacol 400 Mg Tbec (Mesalamine) .... Take 4 Tablets By Mouth Two Times A Day 10)  Prilosec Otc 20 Mg  Tbec (Omeprazole Magnesium) .... Take 1 Tablet By Mouth Once A Day 11)  Tretinoin 0.05 % Crea (Tretinoin) .... Apply To Face/body At Bedtime As Needed 12)  Methotrexate  Tabs (Methotrexate Sodium Tabs) .... Take 31m By Mouth Every Friday 13)  Folic Acid 1 Mg Tabs (Folic Acid) .... Take 1 Tablet By Mouth Once A Day 14)  Albuterol 90 Mcg/act  Aers (Albuterol) .... Inhale 2 Puffs Every 4 Hours As Needed For Shortness of Breath 15)   Prednisone 2.5 Mg  Tabs (Prednisone) .... Take 1 Tablet By Mouth Two Times A Day 16)  Lamisil At 1 % Crea (Terbinafine Hcl) .... Apply To Both Feet At Bedtime For Fungal Infection 17)  Constulose 10 Gm/140mSoln (Lactulose) .... Take 15 Ml By Mouth 5 Days A Week At Bedtime For Constipation  Allergies (verified): No Known Drug Allergies  Past History:  Past Medical History: Last updated: 06/17/2009 Anemia-NOS, hx of Asthma GERD Hypothyroidism Rheumatoid arthritis, seronegative, Dr. KeJefm Bryantcne Systemic lupus erythmatosus Down's sydrome (Trisomy 21) Ulcerative colitis Mental retardation Long-term steroid use Osteopenia Macrocytosis Previous fracture of right foot Previous rib fractures Previous fracture of distal right fibula  Past Surgical History: Last updated: 03/18/2006 Hysterectomy B/L foot surgery  Vital Signs:  Patient profile:   [age]ear old female Height:      57 inches (144.78 cm) Weight:      181 pounds (82.27 kg) BMI:     39.31 O2 Sat:      99 % on Room air Temp:     98.2 degrees F (36.78 degrees C) oral Pulse rate:   84 / minute BP sitting:   120 / 78  (right arm) Cuff size:   regular  Vitals Entered By: LoFrancesca JewettMA (August 22, 2009 3:41 PM)  O2 Sat at Rest %:  99 O2 Flow:  Room air  Physical Exam  General:  obese.   Nose:  clear nasal discharge.   Mouth:  MP 3 airway Neck:  no JVD.   Lungs:  diminished breath sounds, no wheeze Heart:  regular rate and rhythm, S1, S2 without murmurs, rubs, gallops, or clicks Extremities:  minimal ankle edema Cervical Nodes:  no significant adenopathy   Impression & Recommendations:  Problem # 1:  ASTHMA (ICD-493.90) She has improved.  She may be getting hoarseness from advair.  Will change to symbicort to see if she tolerates this better.  Will also change to as needed xopenex as she was not able to tolerate albuterol.  Problem # 2:  CHRONIC RHINITIS (ICD-472.0) This has improved.  She is to use her  sinus regimen as needed.  Problem # 3:  SNORING (ICD-786.09) Still concerned that she may have sleep disordered breathing.  Will discuss further at next visit.  Medications Added to Medication List This Visit: 1)  Symbicort 160-4.5 Mcg/act Aero (Budesonide-formoterol fumarate) .... Two puffs two times a day  Complete Medication List: 1)  Symbicort 160-4.5 Mcg/act Aero (Budesonide-formoterol fumarate) .... Two puffs two times a day 2)  Spiriva Handihaler 18 Mcg Caps (Tiotropium bromide monohydrate) .... Take 1 puff by mouth everyday 3)  Albuterol 90 Mcg/act Aers (Albuterol) .... Inhale 2 puffs every 4 hours as needed for shortness of breath 4)  Xopenex Nebu (Levalbuterol hcl nebu) .... Inhale via nebulizer two times a day as needed 5)  Levothroid 150 Mcg Tabs (Levothyroxine sodium) .... Take 1 tablet by mouth once a day 6)  Aspirin 81 Mg Tbec (Aspirin) .... Take 1 tablet by mouth once a day 7)  Calcium-vitamin D 500-200 Mg-unit Tabs (Calcium carbonate-vitamin d) .... Take 1 tablet by mouth two times a day 8)  Theragran Tabs (Multiple vitamin) .... Take 1 tablet by mouth once a day 9)  Alendronate Sodium 70 Mg Tabs (Alendronate sodium) .... Take 1 tablet by mouth once a week. 10)  Asacol 400 Mg Tbec (Mesalamine) .... Take 4 tablets by mouth two times a day 11)  Prilosec Otc 20 Mg Tbec (Omeprazole magnesium) .... Take 1 tablet by mouth once a day 12)  Tretinoin 0.05 % Crea (Tretinoin) .... Apply to face/body at bedtime as needed 13)  Methotrexate Tabs (Methotrexate sodium tabs) .... Take 87m by mouth every friday 14)  Folic Acid 1 Mg Tabs (Folic acid) .... Take 1 tablet by mouth once a day 15)  Prednisone 2.5 Mg Tabs (Prednisone) .... Take 1 tablet by mouth two times a day 16)  Lamisil At 1 % Crea (Terbinafine hcl) .... Apply to both feet at bedtime for fungal infection 17)  Constulose 10 Gm/183mSoln (Lactulose) .... Take 15 ml by mouth 5 days a week at bedtime for constipation  Other  Orders: Est. Patient Level III ([id number] Patient Instructions: 1)  Stop advair (purple circle) inhaler 2)  Symbicort (red inhaler) two puffs two times a day, and brush teeth after each use 3)  Spiriva (gray inhaler) one puff once daily  4)  Xopenex nebulizer up to four times per day as needed  5)  Follow up in 2 to 3 months Prescriptions: SYMBICORT 160-4.5 MCG/ACT AERO (BUDESONIDE-FORMOTEROL FUMARATE) two puffs two times a day  #1 x 4   Entered and Authorized by:   ViChesley MiresD   Signed by:   ViChesley MiresD on 08/22/2009   Method used:   Print then Give to Patient   RxID:   [id number]

## 2010-06-30 NOTE — Progress Notes (Signed)
  Phone Note Outgoing Call   Call placed by: [facility] Call placed to: Patient Action Taken: Camera operator of Call: Spoke with patient's mother (also caretaker) regarding isolated elevation of ALT. Discussed that this should be discussed with pt's rheumatologist as patient has been taking methotrexate for some time now. Will recheck LFTS at next clinic visit. Pt will also be scheduled to be seen by rheumatologist Dr. Jefm Bryant.

## 2010-06-30 NOTE — Miscellaneous (Signed)
Summary: Orders Update pft charges  Clinical Lists Changes  Orders: Added new Service order of Carbon Monoxide diffusing w/capacity (94720) - Signed Added new Service order of Lung Volumes (94240) - Signed Added new Service order of Spirometry (Pre & Post) (94060) - Signed 
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
## 2010-06-30 NOTE — Assessment & Plan Note (Signed)
Summary: Cindy TO CHECK HER COUGHING/CFB([facility])   Vital Signs:  Patient profile:   40 year old female Height:      57 inches (144.78 cm) Weight:      173.9 pounds (79.05 kg) BMI:     37.77 O2 Sat:      95 % on Room air Temp:     97.8 degrees F (36.56 degrees C) oral Pulse rate:   82 / minute BP sitting:   117 / 76  (right arm)  Vitals Entered By: Hilda Blades Ditzler RN (Oct 01, 2009 3:11 PM)  O2 Flow:  Room air Is Patient Diabetic? No Pain Assessment Patient in pain? no      Nutritional Status BMI of > 30 = obese Nutritional Status Detail appetite good  Have you ever been in a relationship where you felt threatened, hurt or afraid?denies   Does patient need assistance? Functional Status Self care Ambulation Normal Comments Caregiver states pt better. Has yellow productive cough. Upper chest feels heavy.   Primary Care Provider:  [name]   History of Present Illness: 40 year old with Past Medical History: Anemia-NOS, hx of Asthma GERD Hypothyroidism Rheumatoid arthritis, seronegative, Dr. Jefm Bryant Acne Systemic lupus erythmatosus Down's sydrome (Trisomy 21) Ulcerative colitis Mental retardation Long-term steroid use Osteopenia Macrocytosis Previous fracture of right foot Previous rib fractures Previous fracture of distal right fibula  She is having  productive cough, started 4 days ago, yellow, no fevers, no chills, no shortness of breath. No sick contact. She is having runny nose.  She is not using more frequent her albuterol.    Depression History:      The patient denies a depressed mood most of the day and a diminished interest in her usual daily activities.         Preventive Screening-Counseling & Management  Alcohol-Tobacco     Alcohol drinks/day: 0     Smoking Status: never  Caffeine-Diet-Exercise     Does Patient Exercise: no  Current Medications (verified): 1)  Symbicort 160-4.5 Mcg/act Aero (Budesonide-Formoterol Fumarate) ....  Two Puffs Two Times A Day 2)  Spiriva Handihaler 18 Mcg Caps (Tiotropium Bromide Monohydrate) .... Take 1 Puff By Mouth Everyday 3)  Xopenex  Nebu (Levalbuterol Hcl Nebu) .... Inhale Via Nebulizer Two Times A Day As Needed 4)  Levothroid 150 Mcg Tabs (Levothyroxine Sodium) .... Take 1 Tablet By Mouth Once A Day 5)  Aspirin 81 Mg  Tbec (Aspirin) .... Take 1 Tablet By Mouth Once A Day 6)  Calcium-Vitamin D 500-200 Mg-Unit Tabs (Calcium Carbonate-Vitamin D) .... Take 1 Tablet By Mouth Two Times A Day 7)  Theragran Tabs (Multiple Vitamin) .... Take 1 Tablet By Mouth Once A Day 8)  Alendronate Sodium 70 Mg Tabs (Alendronate Sodium) .... Take 1 Tablet By Mouth Once A Week. 9)  Asacol 400 Mg Tbec (Mesalamine) .... Take 4 Tablets By Mouth Two Times A Day 10)  Prilosec Otc 20 Mg  Tbec (Omeprazole Magnesium) .... Take 1 Tablet By Mouth Once A Day 11)  Tretinoin 0.05 % Crea (Tretinoin) .... Apply To Face/body At Bedtime As Needed 12)  Methotrexate  Tabs (Methotrexate Sodium Tabs) .... Take 26m By Mouth Every Friday 13)  Folic Acid 1 Mg Tabs (Folic Acid) .... Take 1 Tablet By Mouth Once A Day 14)  Prednisone 2.5 Mg  Tabs (Prednisone) .... Take 1 Tablet By Mouth Two Times A Day 15)  Lamisil At 1 % Crea (Terbinafine Hcl) .... Apply To Both Feet At  Bedtime For Fungal Infection 16)  Constulose 10 Gm/57m Soln (Lactulose) .... Take 15 Ml By Mouth 5 Days A Week At Bedtime For Constipation  Allergies: No Known Drug Allergies  Review of Systems  The patient denies fever, hemoptysis, abdominal pain, melena, hematochezia, and severe indigestion/heartburn.    Physical Exam  General:  alert, well-nourished, and well-hydrated.   Head:  normocephalic and atraumatic.   Lungs:  normal respiratory effort, no intercostal retractions, no accessory muscle use, and no wheezes.  diffuse ronchi. Heart:  normal rate and regular rhythm.   Abdomen:  soft, non-tender, normal bowel sounds, no distention, and no masses.     Extremities:  No edema. Neurologic:  alert & oriented X3 and strength normal in all extremities.     Impression & Recommendations:  Problem # 1:  COUGH (ICD-786.2) She presents complaining of cough. I think this is acute bronchitis. I will prescribe avelox because she is on prednisone and at risk for infection. I will prescribe guafenesin for cough. No sign or symptoms of Asthma exacerbation,   Complete Medication List: 1)  Symbicort 160-4.5 Mcg/act Aero (Budesonide-formoterol fumarate) .... Two puffs two times a day 2)  Spiriva Handihaler 18 Mcg Caps (Tiotropium bromide monohydrate) .... Take 1 puff by mouth everyday 3)  Xopenex Nebu (Levalbuterol hcl nebu) .... Inhale via nebulizer two times a day as needed 4)  Levothroid 150 Mcg Tabs (Levothyroxine sodium) .... Take 1 tablet by mouth once a day 5)  Aspirin 81 Mg Tbec (Aspirin) .... Take 1 tablet by mouth once a day 6)  Calcium-vitamin D 500-200 Mg-unit Tabs (Calcium carbonate-vitamin d) .... Take 1 tablet by mouth two times a day 7)  Theragran Tabs (Multiple vitamin) .... Take 1 tablet by mouth once a day 8)  Alendronate Sodium 70 Mg Tabs (Alendronate sodium) .... Take 1 tablet by mouth once a week. 9)  Asacol 400 Mg Tbec (Mesalamine) .... Take 4 tablets by mouth two times a day 10)  Prilosec Otc 20 Mg Tbec (Omeprazole magnesium) .... Take 1 tablet by mouth once a day 11)  Tretinoin 0.05 % Crea (Tretinoin) .... Apply to face/body at bedtime as needed 12)  Methotrexate Tabs (Methotrexate sodium tabs) .... Take 165mby mouth every friday 13)  Folic Acid 1 Mg Tabs (Folic acid) .... Take 1 tablet by mouth once a day 14)  Prednisone 2.5 Mg Tabs (Prednisone) .... Take 1 tablet by mouth two times a day 15)  Lamisil At 1 % Crea (Terbinafine hcl) .... Apply to both feet at bedtime for fungal infection 16)  Constulose 10 Gm/1574moln (Lactulose) .... Take 15 ml by mouth 5 days a week at bedtime for constipation 17)  Avelox Abc Pack 400 Mg Tabs  (Moxifloxacin hcl) .... Take 1 tablet by mouth daily for 5 days. 18)  Guaifenesin 400 Mg Tabs (Guaifenesin) .... Take 1 tablet every 8 hour as needed for cough.  Patient Instructions: 1)  Please schedule a follow-up appointment in 1 month. 2)  Patient can use xopenex nebulizer every 4 hours as needed for dyspnea.  Prescriptions: AVELOX ABC PACK 400 MG TABS (MOXIFLOXACIN HCL) Take 1 tablet by mouth daily for 5 days.  #5 x 0   Entered and Authorized by:   BelNiel Hummer   Signed by:   BelNiel Hummer on 10/01/2009   Method used:   Print then Give to Patient   RxID:  :   [id number] 400 MG TABS (GUAIFENESIN) Take 1 tablet every 8 hour as needed  for cough.  #40 x 0   Entered and Authorized by:   Niel Hummer MD   Signed by:   Niel Hummer MD on 10/01/2009   Method used:   Print then Give to Patient   RxID:   [id number]   Prevention & Chronic Care Immunizations   Influenza vaccine: Historical  (05/31/2009)   Influenza vaccine deferral: Deferred  (03/12/2009)    Tetanus booster: Not documented    Pneumococcal vaccine: Pneumovax  (03/26/2006)  Other Screening   Pap smear: Not documented    Mammogram: Not documented   Mammogram action/deferral: Ordered  (01/31/2009)   Smoking status: never  (10/01/2009)  Lipids   Total Cholesterol: 181  (01/31/2009)   LDL: 112  (01/31/2009)   LDL Direct: Not documented   HDL: 50  (01/31/2009)   Triglycerides: 95  (01/31/2009)      Resource handout printed.

## 2010-06-30 NOTE — Medication Information (Signed)
Summary: Fluticasone nasal spray/[facility]  Fluticasone nasal spray/[facility]   Imported By: Phillis Knack 10/07/2009 14:46:03  _____________________________________________________________________  External Attachment:    Type:   Image     Comment:   External Document

## 2010-06-30 NOTE — Miscellaneous (Signed)
Summary: [facility]:Drug Review   Imported By: Bonner Puna 06/24/2009 16:08:47  _____________________________________________________________________  External Attachment:    Type:   Image     Comment:   External Document

## 2010-07-02 NOTE — Assessment & Plan Note (Signed)
Summary: abd pain/gg   Vital Signs:  Patient profile:   40 year old female Height:      57 inches (144.78 cm) Weight:      160.3 pounds (72.86 kg) BMI:     34.81 Temp:     97.4 degrees F oral Pulse rate:   81 / minute BP sitting:   123 / 81  (right arm) Cuff size:   regular  Vitals Entered By: Morrison Old RN (June 03, 2010 1:34 PM) CC: Vomited  last night; no c/o n/v today. Is Patient Diabetic? No Pain Assessment Patient in pain? yes     Location: under right breat  Intensity: 'hurts" Type: boils Onset of pain  Intermittent Nutritional Status BMI of > 30 = obese  Have you ever been in a relationship where you felt threatened, hurt or afraid?Unable to ask; someone w/pt.   Does patient need assistance? Functional Status Self care, Cook/clean, Shopping Ambulation Normal   Primary Care Provider:  [name]  CC:  Vomited  last night; no c/o n/v today.Marland Kitchen  History of Present Illness: Pt is a 40 y/o F with PMH outlined in the EMR who presents today for an acute visit and c/o  1. Right sided pain under breast: pt reports a painful lesion under her right breast present for approx 1 week.  Caretaker present confirms this and states pt has hx of boils.  Pt admits to pain and some drainage from wound.  Denies fever, chills, and other skin lesions.    2. Nausea/vomiting: pt reports emesis x 2 last night.   Pt denies bloody emesis, bilious emesis, fever, chills, diarrhea, BRBPR/bloody BMs.    She reports epsigastric abdominal pain that worsens with eating and is without alleviating factors.  SHe states the pain is better today and she has been able to eat breakfast without any problems.    Depression History:      The patient denies a depressed mood most of the day and a diminished interest in her usual daily activities.         Preventive Screening-Counseling & Management  Alcohol-Tobacco     Alcohol drinks/day: 0     Smoking Status: never  Caffeine-Diet-Exercise    Does Patient Exercise: no  Current Medications (verified): 1)  Symbicort 160-4.5 Mcg/act Aero (Budesonide-Formoterol Fumarate) .... Two Puffs Two Times A Day 2)  Spiriva Handihaler 18 Mcg Caps (Tiotropium Bromide Monohydrate) .... Take 1 Puff By Mouth Everyday 3)  Xopenex  Nebu (Levalbuterol Hcl Nebu) .... Inhale Via Nebulizer Two Times A Day As Needed 4)  Aspirin 81 Mg  Tbec (Aspirin) .... Take 1 Tablet By Mouth Once A Day 5)  Calcium-Vitamin D 500-200 Mg-Unit Tabs (Calcium Carbonate-Vitamin D) .... Take 1 Tablet By Mouth Two Times A Day 6)  Theragran Tabs (Multiple Vitamin) .... Take 1 Tablet By Mouth Once A Day 7)  Alendronate Sodium 70 Mg Tabs (Alendronate Sodium) .... Take 1 Tablet By Mouth Once A Week. 8)  Asacol 400 Mg Tbec (Mesalamine) .... Take 4 Tablets By Mouth Two Times A Day 9)  Omeprazole 40 Mg Cpdr (Omeprazole) .... Take 1 Tablet By Mouth Once A Day 10)  Tretinoin 0.05 % Crea (Tretinoin) .... Apply To Face/body At Bedtime As Needed 11)  Methotrexate  Tabs (Methotrexate Sodium Tabs) .... Take 45m By Mouth Every Friday 12)  Folic Acid 1 Mg Tabs (Folic Acid) .... Take 1 Tablet By Mouth Once A Day 13)  Prednisone 2.5 Mg  Tabs (  Prednisone) .... Take 1 Tablet By Mouth Two Times A Day 14)  Lamisil At 1 % Crea (Terbinafine Hcl) .... Apply To Both Feet At Bedtime For Fungal Infection 15)  Constulose 10 Gm/46m Soln (Lactulose) .... Take 15 Ml By Mouth 5 Days A Week At Bedtime For Constipation 16)  Vitamin E 25000 Unit Oil (Vitamin E) .... Please Apply To Burn Lesions On Left Hand Twice Daily. 17)  Debrox 6.5 % Soln (Carbamide Peroxide) .... Please Place Drops in Both Ears As Directed 18)  Nasal 0.65 % Soln (Saline) .... Please Spray Into Each Nostril, 1-2 Sprays Every 6 Hours As Needed For Dryness. 19)  Cortisporin 3.5-10000-1 Soln (Neomycin-Polymyxin-Hc) .... Instill One Drop To Right Eye Three Times A Day For 5-7 Days For An Eye Infection 20)  Levothroid 100 Mcg Tabs (Levothyroxine  Sodium) .... Take 1 Tablet By Mouth Once A Day in The Morning, On Empty Stomach 21)  Zofran 4 Mg Tabs (Ondansetron Hcl) .... Take One Tab By Mouth Every 6 Hr As Needed For Nausea/vomiting 22)  Doxycycline Hyclate 100 Mg Caps (Doxycycline Hyclate) .... Take 1 Tablet By Mouth Two Times A Day X 7 Days  Allergies (verified): No Known Drug Allergies  Past History:  Past medical, surgical, family and social histories (including risk factors) reviewed for relevance to current acute and chronic problems.  Past Medical History: Reviewed history from 06/17/2009 and no changes required. Anemia-NOS, hx of Asthma GERD Hypothyroidism Rheumatoid arthritis, seronegative, Dr. KJefm BryantAcne Systemic lupus erythmatosus Down's sydrome (Trisomy 21) Ulcerative colitis Mental retardation Long-term steroid use Osteopenia Macrocytosis Previous fracture of right foot Previous rib fractures Previous fracture of distal right fibula  Past Surgical History: Reviewed history from 03/18/2006 and no changes required. Hysterectomy B/L foot surgery  Family History: Reviewed history from 06/17/2009 and no changes required. Heart disease---mother  Social History: Reviewed history from 07/18/2008 and no changes required. Single Never Smoked Alcohol use-no Drug use-no Lives in a Group Home  Physical Exam  General:  alert and well-developed.  VS reviewed and wnl Head:  normocephalic and atraumatic.   Eyes:  EOMI.  PERRLA.  Conjuctivae without injection or pallor.  Sclera anicteric Mouth:  pharynx pink and moist and no erythema.   Neck:  supple and no masses.   Breasts:  approx 2cm x 2 cm mildly erythematous lesion with slight induration present under right breast.  Evidence of serous drainage present, no expressible drainage on exam.  Lesion is tender to palpation. Lungs:  normal respiratory effort and normal breath sounds.   Heart:  normal rate, regular rhythm, no murmur, no gallop, and no rub.     Abdomen:  Mild TTP in epigastrium.  non-tender, normal bowel sounds, no distention, no masses, no guarding, no rigidity, no rebound tenderness, no hepatomegaly, and no splenomegaly.   Extremities:  no edema  Neurologic:  alert & oriented X3.   no focal neurologic deficits Skin:  turgor normal, color normal, and no rashes.   Psych:  normally interactive and good eye contact.     Impression & Recommendations:  Problem # 1:  ABDOMINAL PAIN, EPIGASTRIC (ICD-789.06)  Pts symptoms and physical exam are most c/w gastritis/GERD; she is particularly at risk for this as she is on chronic steroids for managment of her UC.  She is without any diarrhea, bloody bowel movements, fever, or other signs/symptoms one would expect from a UC flare.  Will check UA and urine pregnancy to rule out UTI and pregnancy as possible etiologies for pts  abdominal pain with nasuea and vomiting.  Will increase her dose of prilosec from 20 to 10m once daily and will also send her home with zofran to be used as needed for nausea/vomiting.  Pt and caregiver intructed to rtc or to go to the ER if symptoms do not resolve, worsen, or pt develops other concerning problems.  Her updated medication list for this problem includes:    Asacol 400 Mg Tbec (Mesalamine) ..Marland Kitchen.. Take 4 tablets by mouth two times a day  Orders: T-Urinalysis Dipstick only ((36629UT T-Urine Pregnancy (in -house) ((65465  Problem # 2:  BOILS, RECURRENT (ICD-680.9) Lesion under pts right breast is c/w a boil.  It appears to have drained spontaneuously and is healing well with no purulent material expressed on exam and minimal induration.  Will prescribe a 7 day course of doxycycline for empiric abx treatment.  Medications Added to Medication List This Visit: 1)  Omeprazole 40 Mg Cpdr (Omeprazole) .... Take 1 tablet by mouth once a day 2)  Zofran 4 Mg Tabs (Ondansetron hcl) .... Take one tab by mouth every 6 hr as needed for nausea/vomiting 3)  Doxycycline  Hyclate 100 Mg Caps (Doxycycline hyclate) .... Take 1 tablet by mouth two times a day x 7 days  Complete Medication List: 1)  Symbicort 160-4.5 Mcg/act Aero (Budesonide-formoterol fumarate) .... Two puffs two times a day 2)  Spiriva Handihaler 18 Mcg Caps (Tiotropium bromide monohydrate) .... Take 1 puff by mouth everyday 3)  Xopenex Nebu (Levalbuterol hcl nebu) .... Inhale via nebulizer two times a day as needed 4)  Aspirin 81 Mg Tbec (Aspirin) .... Take 1 tablet by mouth once a day 5)  Calcium-vitamin D 500-200 Mg-unit Tabs (Calcium carbonate-vitamin d) .... Take 1 tablet by mouth two times a day 6)  Theragran Tabs (Multiple vitamin) .... Take 1 tablet by mouth once a day 7)  Alendronate Sodium 70 Mg Tabs (Alendronate sodium) .... Take 1 tablet by mouth once a week. 8)  Asacol 400 Mg Tbec (Mesalamine) .... Take 4 tablets by mouth two times a day 9)  Omeprazole 40 Mg Cpdr (Omeprazole) .... Take 1 tablet by mouth once a day 10)  Tretinoin 0.05 % Crea (Tretinoin) .... Apply to face/body at bedtime as needed 11)  Methotrexate Tabs (Methotrexate sodium tabs) .... Take 161mby mouth every friday 12)  Folic Acid 1 Mg Tabs (Folic acid) .... Take 1 tablet by mouth once a day 13)  Prednisone 2.5 Mg Tabs (Prednisone) .... Take 1 tablet by mouth two times a day 14)  Lamisil At 1 % Crea (Terbinafine hcl) .... Apply to both feet at bedtime for fungal infection 15)  Constulose 10 Gm/1522moln (Lactulose) .... Take 15 ml by mouth 5 days a week at bedtime for constipation 16)  Vitamin E 25000 Unit Oil (Vitamin e) .... Please apply to burn lesions on left hand twice daily. 17)  Debrox 6.5 % Soln (Carbamide peroxide) .... Please place drops in both ears as directed 18)  Nasal 0.65 % Soln (Saline) .... Please spray into each nostril, 1-2 sprays every 6 hours as needed for dryness. 19)  Cortisporin 3.5-10000-1 Soln (Neomycin-polymyxin-hc) .... Instill one drop to right eye three times a day for 5-7 days for an eye  infection 20)  Levothroid 100 Mcg Tabs (Levothyroxine sodium) .... Take 1 tablet by mouth once a day in the morning, on empty stomach 21)  Zofran 4 Mg Tabs (Ondansetron hcl) .... Take one tab by mouth every 6 hr as  needed for nausea/vomiting 22)  Doxycycline Hyclate 100 Mg Caps (Doxycycline hyclate) .... Take 1 tablet by mouth two times a day x 7 days  Patient Instructions: 1)  Please schedule a follow-up appointment as needed. 2)  If you develop any fever, worsening abdominal pain, vomiting, diarrhea, or other concerning symptoms, call the clinic at [phone] or go to the nearest emergency room. Prescriptions: OMEPRAZOLE 40 MG CPDR (OMEPRAZOLE) Take 1 tablet by mouth once a day  #30 x 11   Entered and Authorized by:   Eduardo Osier DO   Signed by:   Eduardo Osier DO on 06/04/2010   Method used:   Print then Give to Patient   RxID:   [id number]    Orders Added: 1)  T-Urinalysis Dipstick only [66060OK] 2)  T-Urine Pregnancy (in -house) [81025]     Prevention & Chronic Care Immunizations   Influenza vaccine: Fluvax Non-MCR  (01/29/2010)   Influenza vaccine deferral: Refused  (05/14/2010)   Influenza vaccine due: 01/30/2011    Tetanus booster: Not documented   Td booster deferral: Refused  (05/14/2010)    Pneumococcal vaccine: Pneumovax  (03/26/2006)   Pneumococcal vaccine deferral: Not indicated  (05/14/2010)   Pneumococcal vaccine due: 03/27/2011  Other Screening   Pap smear: Not documented   Pap smear action/deferral: Not indicated S/P hysterectomy  (01/29/2010)    Mammogram: Not documented   Mammogram action/deferral: Ordered  (05/14/2010)   Smoking status: never  (06/03/2010)  Lipids   Total Cholesterol: 164  (01/29/2010)   Lipid panel action/deferral: Lipid Panel ordered   LDL: 98  (01/29/2010)   LDL Direct: Not documented   HDL: 49  (01/29/2010)   Triglycerides: 86  (01/29/2010)   Lipid panel due: 01/30/2011   Laboratory Results   Urine  Tests  Date/Time Recieved: 06/03/10  2:52PM Date/Time Reported: 06/03/10  2:52PM  Routine Urinalysis   Color: lt. yellow Glucose: negative   (Normal Range: Negative) Bilirubin: negative   (Normal Range: Negative) Ketone: negative   (Normal Range: Negative) Spec. Gravity: <1.005   (Normal Range: 1.003-1.035) Blood: negative   (Normal Range: Negative) pH: 5.0   (Normal Range: 5.0-8.0) Protein: negative   (Normal Range: Negative) Urobilinogen: 0.2   (Normal Range: 0-1) Nitrite: negative   (Normal Range: Negative) Leukocyte Esterace: negative   (Normal Range: Negative)    Urine HCG: negative    Laboratory Results   Urine Tests    Routine Urinalysis   Color: lt. yellow Glucose: negative   (Normal Range: Negative) Bilirubin: negative   (Normal Range: Negative) Ketone: negative   (Normal Range: Negative) Spec. Gravity: <1.005   (Normal Range: 1.003-1.035) Blood: negative   (Normal Range: Negative) pH: 5.0   (Normal Range: 5.0-8.0) Protein: negative   (Normal Range: Negative) Urobilinogen: 0.2   (Normal Range: 0-1) Nitrite: negative   (Normal Range: Negative) Leukocyte Esterace: negative   (Normal Range: Negative)    Urine HCG: negative

## 2010-07-02 NOTE — Progress Notes (Signed)
Summary: clarification/ hla  Phone Note Other Incoming   Summary of Call: group home calls for clarification for cortisporin opth, directions say for 5 or 7 days, they need an exact # of days. please advise. Initial call taken by: Freddy Finner RN,  May 15, 2010 3:07 PM  Follow-up for Phone Call        7 days please. Follow-up by: Burman Freestone MD,  May 15, 2010 3:26 PM  Additional Follow-up for Phone Call Additional follow up Details #1::        left message Additional Follow-up by: Freddy Finner RN,  May 21, 2010 10:41 AM
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
## 2010-07-02 NOTE — Progress Notes (Signed)
Summary: phone/gg  Phone Note From Other Clinic   Summary of Call: Call from [place],   nurse stating pt c/o abd pain since yesterday.   no fever, decrease appetite.  Tylenol given for pain. Today pt is at workshop/classroom and still c/o vomiting and abd pain. Hx down's synd.  will see today to evaluate   Initial call taken by: Gevena Cotton RN,  June 03, 2010 10:50 AM

## 2010-10-13 NOTE — Discharge Summary (Signed)
NAMETYLIYAH, MCMEEKIN NO.:  [id number]   MEDICAL RECORD NO.:  [id number]          PATIENT TYPE:  OBV   LOCATION:  6945                         FACILITY:  [facility]   PHYSICIAN:  Lucy Chris, MD     DATE OF BIRTH:  Oct 07, 1970   DATE OF ADMISSION:  07/18/2007  DATE OF DISCHARGE:  07/19/2007                               DISCHARGE SUMMARY   DISCHARGE DIAGNOSES:  1. Gastroenteritis, probably viral.  2. Hypothyroidism on Synthroid, but TSH is too low, so drug-induced      hyperthyroidism.  Discharged with reduced Synthroid.  3. Hypokalemia.   Inactive diagnoses include:  1. Small bowel obstruction.  2. Down syndrome.  3. Hypothyroidism.  4. Ulcerative colitis.  5. Rheumatoid arthritis.  6. Asthma.  7. Gastroesophageal reflux disease.  8. Anemia of chronic disease.  9. Osteopenia  10.History of hysterectomy.   MEDICATIONS ON DISCHARGE:  1. Synthroid 125 mcg p.o. daily.  Please note this is changed from 175      mcg p.o. daily which she is taking.  2. Aspirin 81 mg p.o. daily.  3. Calcium and vitamin D per her previous regimen.  4. Asacol 100 mg 4 tablets b.i.d.  5. Xopenex as needed.  6. Omeprazole 20 mg p.o. daily.  7. Methotrexate 15 mg p.o. once weekly.  8. Folic acid 1 mg p.o. daily.  9. Nasonex and MiraLax per her previous regimen.  10.Tramadol 50 mg p.o. p.r.n. q.6 h.  11.Prednisone 2.5 mg p.o. b.i.d.   DISPOSITION AND FOLLOWUP:  Ms. Veldhuizen is sent home.  She will follow  up with Dr. Dyann Kief in the [facility] on February 23 at  1:30 p.m.  Ms. Jeangilles needs to follow up on certain issues:  1. Any persistent symptoms of nausea, vomiting, or diarrhea.  2. Hypothyroidism.  Please note that her pre-admission dose of      Synthroid was 175 mcg p.o. daily and postdischarge dose is 125 mcg      p.o. daily because her TSH was decreased at 0.049.  She needs a      followup TSH after a month.  3. Hypokalemia.  We are sending her with p.o.  potassium, and she needs      to follow up with BMET.  4. Please followup on the pending labs as follows.   PROCEDURES DONE DURING THIS HOSPITALIZATION:  Chest x-ray which was  stable with mild cardiac enlargement, no acute abnormality, no  obstruction or free air.  She also has fecal occult blood positive.  Her  UA was clear.  TSH 0.049.  Lipase 17.   BRIEF ADMITTING HISTORY OF PRESENT ILLNESS:  Ms. Geremia is a 40-year-  old lady with Down syndrome who is residing in group home, presented to  the clinic with 1-day history of intractable nausea, vomiting, and  diarrhea.  Michaele denies hematemesis but has noted a small amount of  blood in the stool.  She was unable to keep anything down earlier.  There were possibly some sick contacts at her house.  She denies fever  or chills at home but has been febrile.  Her mother notes that her face  has been much more red than usual.   PHYSICAL EXAMINATION:  VITAL SIGNS:  Temperature 100.6, blood pressure  119/70, pulse 113, respirations 17, and saturating 95 on room air.  GENERAL:  She is alert and pleasant, and not in distress.  HEENT:  Eyes, mildly injected conjunctiva bilaterally.  NECK:  Supple.  No lymphadenopathy.  CHEST:  Clear to auscultation bilaterally anteriorly.  Normal  respiratory effort.  CARDIOVASCULAR:  Mild tachycardia, 2/6 systolic murmur, best heard at  the left sternal border.  ABDOMEN:  Hyperactive bowel sounds.  Soft and mildly tender to palpation  globally.  No rebound.  No guarding.  EXTREMITIES:  Bilateral dorsalis pedal pulses 2+.  No edema.  SKIN:  Face erythematous.  No cervical lymphadenopathy.  NEUROLOGIC EXAM:  Grossly intact.   LABS ON ADMISSION:  WBC 8.7, hemoglobin 15.1, platelets 223, ANC 7.8,  and MCV 104.  Sodium 134, potassium 3.8, chloride 99, BUN 10, bicarb 28,  creatinine 1, glucose 114, lipase 17 , magnesium 2, bilirubin 0.9, and  alkaline phosphatase 94.  AST 32, ALT 31, protein 6.1, albumin  2.7, and  calcium 8.3.   HOSPITAL COURSE:  1. Nausea, vomiting, and diarrhea.  She was basically admitted to the      regular bed.  She was kept n.p.o., and she was repleted with IV      fluids and her electrolytes were also repleted including potassium.      We sent the stool for Giardia/Cryptosporidium culture and      Clostridium difficile.  We will send blood culture x2.  All these      labs are pending.  Her fecal occult blood is positive, but we are      not worried about this because she has a history of ulcerative      colitis, and she does not have a frank gross bloody stool or black      stool.  On the next day of admission, she had very minimal      diarrhea, but no nausea or vomiting.  She tolerated her p.o.      intake, and she is sent home.  2. Hypothyroidism.  Her Synthroid is changed from 175 mcg p.o. daily      to 125 mcg p.o. daily.  3. Hypokalemia.  We are repleting her potassium, and she will get BMET      in the clinic.   DISCHARGE VITALS:  Temperature 99.2, pulse 90, respiration 18, blood  pressure 100/69, and oxygen saturation 94% on room air.   DISCHARGE LABS:  Sodium 140, potassium 3.6, chloride 108, bicarb 23,  glucose 86, BUN 8, creatinine 0.84, and calcium 8.2.  Pending labs:  Stool for Giardia/Cryptosporidium, stool for Salmonella-Shigella, and  culture stool for Clostridium difficile.  Blood culture x2, stool Gram  stain, and urine culture.      Flo Shanks, MD  Electronically Signed      Lucy Chris, MD  Electronically Signed    YP/MEDQ  D:  07/19/2007  T:  07/20/2007  Job:  [id number]

## 2010-10-16 NOTE — Procedures (Signed)
EEG NUMBER:  [id number].   HISTORY:  This is a 40 year old with a history of tremors who is having EEG  done to evaluate for seizures.   PROCEDURE:  This is a routine EEG.   DESCRIPTION OF PROCEDURE:  Throughout this routine EEG, there is a posterior  dominant rhythm of 9 Hz activity at 30-40 mcV of background activity with  lower alpha and upper theta range activity at 20-50 mcV.  Occasional notice  in the background there is EMG in electrocardiac artifact that occasionally  obscures the background.  With photic stimulation, there is a mild symmetric  photic driving response noted.  Hyperventilation was not performed  throughout this recording.  The patient does become drowsy and briefly falls  into stage II sleep.  Throughout this record, there is no evidence of  epileptiform activity or discharge as noted.   IMPRESSION:  This routine EEG is essentially within normal limits in the  awake and sleep states.  There does seem to be possibly mild background  slowing noticed throughout which could suggest drowsiness, however, could  also suggest a toxic metabolic or primary neuronal disorder.  Clinical  correlation is advised.      Shaune Pascal. Estella Husk, M.D.  Electronically Signed     UVO:[id number]  D:  03/23/2006 13:24:50  T:  03/24/2006 13:21:41  Job #:  [id number]

## 2010-10-16 NOTE — Discharge Summary (Signed)
Cindy Oconnor, Cindy Oconnor            ACCOUNT NO.:  [id number]   MEDICAL RECORD NO.:  [id number]          PATIENT TYPE:  INP   LOCATION:  5736                         FACILITY:  [facility]   PHYSICIAN:  Luane School, M.D.   DATE OF BIRTH:  03-31-1971   DATE OF ADMISSION:  03/21/2006  DATE OF DISCHARGE:  03/24/2006                                 DISCHARGE SUMMARY   CONSULTANTS:  None.   DISCHARGE DIAGNOSES:  1. Bronchitis and subglottic narrowing.  2. Orthostatic hypertension.  3. Megaloblastic anemia.  4. Gastroesophageal reflux disease.  5. Systemic lupus erythematosus and ulcerative colitis.  6. Rheumatoid arthritis.  7. Hypothyroidism.   DISCHARGE MEDICATIONS:  1. Folic acid 1 mg daily.  2. Levothyroxine 175 mg daily.  3. Mesalamine 1600 mg b.i.d.  4. Hydroxychloroquine 200 mg b.i.d.  5. Calcium carbonate one tablet daily.  6. Nasonex one to two sprays when needed.  7. Methotrexate 15 mg on Friday.  8. Xopenex 0.63 mg inhaled q.6h. for 3 more weeks.  9. Avelox 400 mg daily for 5 more days.  10.Aspirin 81 mg daily.  11.Mucinex 600 mg b.i.d. for 2 more weeks.  12.Tylenol 600 mg q.6h. when needed.  13.Prednisone taper 60 mg, and continue at 2.5 mg daily.   DISPOSITION:  The patient will be followed up at outpatient clinic on  November 11 with a previous appointment she had.  Her respiration will be  evaluated.  Her vaccination record will also be evaluated.  Also will check  a CBC for any megaloblastic anemia secondary to methotrexate.   PROCEDURES PERFORMED:  None.   ADMITTING HISTORY AND PHYSICAL:  This is a 40 year old white female with  Down syndrome who presented to the outpatient clinic with a 2-3 day history  of wheezing, coughing and congestion.  The patient lives at a group home  with other residents who may have similar symptoms.  She has a history of  asthma.  Baseline peak flow not known.  The patient denied any chest pain,  shortness of breath, fever, chills,  only mild headache, and no symptoms of  hypoxia.   PHYSICAL EXAMINATION:  CHEST:  Respiration is with good air movement  bilaterally.  She had some wheezing, no rhonchi, no rub, no crackles, but  excellent air movement.  CARDIOVASCULAR:  Regular rate and rhythm with distant heart sounds.  ABDOMEN:  Soft, nontender, with positive bowel sounds.  VITAL SIGNS:  Temperature 97.8, blood pressure 113/74, with positive  orthostatic pulse 76, respirations 20, and she was saturating 96% on room  air.   ADMISSION LABORATORY DATA:  Sodium 138, potassium 4.1, chloride 101, bicarb  29, BUN 12, creatinine 1.0, glucose 93, bilirubin 0.4, alkaline phosphatase  91, AST 29, ALT 25, protein 6.7, albumin 3.1, calcium 9.0.  White blood  cells 5.2, hemoglobin 13.6, platelets 291, ANC 3.6, MCV 105.8, RDW 16.2.   HOSPITAL COURSE:  1. BRONCHITIS AND SUBGLOTTIC NARROWING:  The patient was started on high-      dose steroids and Avelox.  The patient was afebrile and white blood      cell was 5.2.  Sputum  cultures were sent, which only showed mixed flora      with no growth to date.  The patient was also started on Xopenex and      Advair for her asthma.  The patient had not been vaccinated with the      influenza vaccine so it was given, and also Pneumovax. Was started on      Mucinex for the cough.  After 2 days her sputum went from yellow to      white, and treatment was continued.  She was discharge on  a 5-day      course of Avelox and a prednisone taper.   1. ORTHOSTATIC HYPOTENSION SECONDARY TO DEHYDRATION:  The patient was      treated with fluids and the hypotension resolved in one day.   1. MACROCYTOSIS:  It was probably secondary to methotrexate use.  The      patient was given folate.  Folate was measured and it was high, so it      is recommended to use leucovorin as an outpatient.   Problem 4.  GASTROESOPHAGEAL REFLUX DISEASE:  The patient was started on  Protonix.  She remained stable and no  changes were made to her medication.   Problem 5.  SYSTEMIC LUPUS ERYTHEMATOSUS AND ULCERATIVE COLITIS:  The  patient was asymptomatic and current treatment was continued with  hydrocholoquine and steroids.   Problem 6.  RHEUMATOID ARTHRITIS:  The patient was asymptomatic, so  methotrexate was continued.   Problem 7.  HYPOTHYROIDISM:  The patient remained asymptomatic.  Thyroid  function tests were measured which were normal, so Synthroid was continued.   Vitals on the day of discharge:  Temperature 97.7, pulse 64, respirations  20, blood pressure 96/69.  She was saturating 95% on room air.   Labs on the day of discharge:  White blood cells 13.2, hemoglobin 12.5, MCV  112.3, platelets 246.  Sodium 143, potassium 4.2, chloride 110, bicarb 30,  glucose 85, BUN 16, creatinine 0.8, calcium 8.3.  Respiratory sputum just  showed mixed flora.  Folate 1017 and vitamin B12 was 887.  Blood cultures:  No growth to date.      Charlynne Cousins, M.D.  Electronically Signed     ______________________________  Luane School, M.D.    AF/MEDQ  D:  03/25/2006  T:  03/25/2006  Job:  [id number]   cc:   Rico Sheehan, D.O.

## 2010-10-16 NOTE — Discharge Summary (Signed)
NAMEGENEVER, HENTGES            ACCOUNT NO.:  [id number]   MEDICAL RECORD NO.:  [id number]         PATIENT TYPE:  INPT   LOCATION:  6730                         FACILITY:  [facility]   PHYSICIAN:  Luane School, M.D.   DATE OF BIRTH:  1970-07-06   DATE OF ADMISSION:  09/29/2006  DATE OF DISCHARGE:  09/30/2006                               DISCHARGE SUMMARY   DISCHARGE DIAGNOSES:  1. Nausea and vomiting secondary to obstipation with evidence of      partial small bowel obstruction on chest x-ray.  2. Seronegative inflammatory arthritis.  3. Ulcerative colitis.  4. Macrocytic anemia secondary to medication.  5. History of deep vein thrombosis in remote past, status post      treatment with Coumadin.  6. Gastroesophageal reflux disease.  7. Hypothyroidism.  8. Down syndrome.  9. Osteopenia.  10.History of asthma.  11.Status post hysterectomy.   DISCHARGE MEDICATIONS:  1. Methotrexate 15 mg p.o. every Friday.  2. Folic acid 1 mg p.o. daily.  3. Aspirin 81 mg p.o. daily.  4. Synthroid 175 mcg p.o. daily.  5. Asacol 400 mg p.o. b.i.d.  6. Plaquenil 200 mg p.o. b.i.d.  7. Prednisone 2.5 mg p.o. b.i.d.  8. MiraLax one cap five days a week.   FOLLOWUP:  Patient is to follow up with:  1. Dr. Penelope Coop, of [facility], on Oct 14, 2006, at 10:30      a.m.  Patient had a gastroenterologist in [place] and had been      diagnosed with ulcerative colitis.  She seems to be having      recurrent episodes of nausea, vomiting secondary to stool impaction      with alternating diarrhea and constipation.  Patient is to see Dr.      Penelope Coop for the same.  She carries a diagnosis of ulcerative colitis      from [facility].  2. Patient to follow up with Dr. Jiles Crocker on Oct 20, 2006, at 2 p.m., who      is patient's primary care physician, at that time to make sure that      patient has seen gastroenterologist, as well as rheumatologist Dr.      Jefm Bryant, after discharge.  She needs a  basic metabolic panel to be      checked in the clinic as on day of discharge her potassium was 3.1,      which was repleted and was thought to be secondary to patient's      multiple bowel movements induced by laxatives.  3. Patient is to also followup with Dr. Jefm Bryant.  Dr. Scharlene Gloss      office is currently closed; hence, an appointment could not be made      today.  Patient's mother was informed to call phone number [phone]      [phone] to schedule a followup appointment.   PROCEDURES DONE THIS ADMISSION:  None.   CONSULTANTS THIS ADMISSION:  None.   IMAGES DONE THIS ADMISSION:  1. Abdominal x-ray done on Sep 29, 2006, showing no free air, findings      compatible with partial small bowel  obstruction.  2. Repeat acute abdominal series done on Sep 30, 2006, showing      resolving small bowel obstruction.   BRIEF HISTORY AND PHYSICAL:  Please see H&P for full details.  Ms.  Madia is a 40 year old Caucasian woman with Down syndrome, a  diagnosis of ulcerative colitis which has been made at [facility], seronegative inflammatory arthritis, macrocytic anemia and  other medical problems, presenting to the ED with vomiting which started  the night before date of admission.  Patient's mother reports that  patient was in good health prior to this without any recent illnesses.  There is, however, history of sick contact at her group home who has the  flu and also gastroenteritis of some sort.  There has been no recent  medicine changes.   ALLERGIES:  QUESTION MACROLIDES.   MEDICATIONS:  1. Methotrexate 15 mg q.weekly on Friday.  2. Folic acid 1 mg p.o. daily.  3. Levothyroxine 175 mcg p.o. daily.  4. Prednisone 5 mg p.o. daily.  5. Nasonex.  6. Hydroxychloroquine 200 mg p.o. b.i.d.  7. Mesalamine 400 mg, four tabs b.i.d.  8. Calcium carbonate 500/200.  9. Boniva 150 mg p.o. daily.  10.Aspirin 325 mg p.o. daily.  11.Prilosec 20 mg p.o. daily.   PAST MEDICAL HISTORY:  1.  Down syndrome.  2. Seronegative inflammatory arthritis, seeing rheumatologist Dr.      Jefm Bryant at [place].  3. Hypothyroidism.  4. Systemic lupus.  5. Ulcerative colitis ?, diagnosis of inflammatory bowel disease made      at [facility], records which had been faxed over.  6. Osteopenia.  7. GERD.  8. Asthma.  9. Macrocytic anemia, followed regularly in the outpatient clinic.  10.Status post hysterectomy.   PHYSICAL EXAMINATION:  Temperature 98, blood pressure 130/98, pulse 94,  respiratory rate 18, O2 sats 95% on room air.  GENERAL APPEARANCE:  Patient lying in bed retching.  EYES:  Anicteric, no pallor.  Pupils equal, round, react to light.  ENT:  Oropharynx clear.  Dry mucous membranes.  NECK:  Supple, no adenopathy.  LUNGS:  Air entry equal bilaterally.  Clear to auscultation bilaterally,  no wheezing or rhonchi.  HEART:  Regular rate and rhythm, no murmurs, rubs or gallops.  ABDOMEN:  Soft.  Tenderness to palpation in right upper quadrant and  epigastrium.  No rebound tenderness, no guarding, no CVA tenderness.  Bowel sounds hypoactive.  EXTREMITIES:  Pulses 2+ bilaterally, ulnar deviation bilaterally at  wrists.  SKIN:  No rashes.  Nails thickened.  MENTAL STATUS:  Alert and oriented, coherent language.  Grossly  nonfocal.  Patient with Down syndrome.   LABS ON ADMISSION:  I-STAT values:  Sodium 137, potassium 4.5, chloride  106, bicarbonate 28.  BUN 13, creatinine 1, glucose 109.  Total  bilirubin 0.8, alk phos 26, SGOT 24, SGPT 14, protein 6.1, albumin 2.7,  calcium 7.9.  Hemoglobin 16.7, hematocrit 49, white cell count 8.8,  platelets 270,000, MCV 103.  Amylase 35, lipase 23.  Blood culture is  pending.   BRIEF HOSPITAL COURSE:  1. Nausea, vomiting most likely secondary to partial small bowel      obstruction as evidenced on abdominal series.  Patient, however,     was checked for sources of infection, namely pancreatitis,      cholecystitis,  appendicitis; however, white cell count, liver      function test were within normal limits.  Patient was treated      aggressively with IV fluids  and symptomatically with Phenergan.  Of      note, the patient had not been having bowel movements for some days      prior to admission.  Hence, she was given laxatives where patient      had good bowel movements and the following day an abdominal x-ray      was checked and showed resolving partial small bowel obstruction.      Also on the following day patient got much better with no nausea      and vomiting and abdominal pain had almost disappeared.  Patient      will be kept on a regimen of MiraLax daily to help with bowel      movement, as apparently this nausea and vomiting secondary to stool      impaction is a recurrent problem.  Also of note, urinalysis was      negative for infection, UDS was also negative for possible toxin,      since patient lives in group home.  2. Ulcerative colitis.  Patient had no episodes of diarrhea or bloody      stools here in the hospital.  She, however, had bowel movements      secondary to laxatives secondary to her bowel obstruction.      [facility] was contacted for records and a diagnosis of inflammatory      bowel disease was only made and not specifically ulcerative      colitis, as no skip lesions were noted.  It is for this same reason      that we are referring patient back to gastroenterologist, though      patient's family does not want to go back to [place].  She will      be seen by Dr. Penelope Coop here in [facility] and      further management we will defer to them.  She will be kept on her      ulcerative colitis medications, however, in the interim.  3. Seronegative inflammatory arthritis.  Patient was asymptomatic      during hospitalization and methotrexate, Plaquenil and prednisone      was held secondary to n.p.o. status.  Patient was given a stress      dose of  steroids, namely hydrocortisone, given that she was on      chronic steroids at home, and this nausea and vomiting could be      secondary to an adrenal crisis.  Hydrocortisone was changed back to      prednisone and she will be kept on her low dose of prednisone as an      outpatient.  Patient must follow up with Dr. Jefm Bryant as emphasized      by her primary care physician, Dr. Jiles Crocker, as well in some of her      clinic notes.  Patient's mother was informed of the same.  Today an      appointment could not be made for the patient as Dr. Scharlene Gloss      office is closed on Friday.  Patient's mother was given the phone      number to be able to schedule the appointment.  4. Macrocytic anemia has remained stable throughout hospitalization.      This is regularly followed at our outpatient clinic by her primary      care physician.  5. Patient has a history of DVT and is status post treatment with     Coumadin.  She  was kept on prophylaxis of Lovenox throughout      hospitalization.  6. GERD.  Patient was kept on Protonix throughout hospitalization.   DISCHARGE VITALS:  Temperature 99.4, blood pressure 111/61, pulse 79,  respirations 20, O2 sats 97% on room air.   DISCHARGE LABS:  Hemoglobin 12.2, white cell 5.8, platelets 222,000.  Sodium 139, potassium 3.1, which was repleted with two tablets of 40 mEq  of KCL.  Chloride 110, bicarbonate 27, BUN 3, creatinine 0.8, glucose  90.   LABS PENDING:  Blood cultures x2.      Dionicio Stall, M.D.  Electronically Signed      Luane School, M.D.  Electronically Signed    SS/MEDQ  D:  09/30/2006  T:  09/30/2006  Job:  [id number]

## 2010-11-12 ENCOUNTER — Emergency Department (INDEPENDENT_AMBULATORY_CARE_PROVIDER_SITE_OTHER): Payer: Medicaid Other

## 2010-11-12 ENCOUNTER — Emergency Department (HOSPITAL_BASED_OUTPATIENT_CLINIC_OR_DEPARTMENT_OTHER)
Admission: EM | Admit: 2010-11-12 | Discharge: 2010-11-12 | Disposition: A | Discharge status: Home / Self Care | Payer: Medicaid Other | Attending: Emergency Medicine | Admitting: Emergency Medicine

## 2010-11-12 DIAGNOSIS — R05 Cough: Secondary | ICD-10-CM

## 2010-11-12 DIAGNOSIS — E039 Hypothyroidism, unspecified: Secondary | ICD-10-CM | POA: Insufficient documentation

## 2010-11-12 DIAGNOSIS — R0602 Shortness of breath: Secondary | ICD-10-CM | POA: Insufficient documentation

## 2010-11-12 DIAGNOSIS — F79 Unspecified intellectual disabilities: Secondary | ICD-10-CM | POA: Insufficient documentation

## 2010-11-12 DIAGNOSIS — Q909 Down syndrome, unspecified: Secondary | ICD-10-CM | POA: Insufficient documentation

## 2010-11-12 DIAGNOSIS — J9819 Other pulmonary collapse: Secondary | ICD-10-CM

## 2010-11-12 DIAGNOSIS — J45909 Unspecified asthma, uncomplicated: Secondary | ICD-10-CM | POA: Insufficient documentation

## 2010-11-12 DIAGNOSIS — R079 Chest pain, unspecified: Secondary | ICD-10-CM

## 2010-11-12 DIAGNOSIS — J40 Bronchitis, not specified as acute or chronic: Secondary | ICD-10-CM | POA: Insufficient documentation

## 2011-01-10 ENCOUNTER — Emergency Department: Payer: Self-pay | Admitting: Unknown Physician Specialty

## 2011-01-12 ENCOUNTER — Inpatient Hospital Stay: Payer: Self-pay | Admitting: Internal Medicine

## 2011-02-19 LAB — CBC
MCV: 103.6 — ABNORMAL HIGH
Platelets: 223
WBC: 8.7

## 2011-02-19 LAB — URINALYSIS, ROUTINE W REFLEX MICROSCOPIC
Bilirubin Urine: NEGATIVE
Ketones, ur: 40 — AB
Nitrite: NEGATIVE
Urobilinogen, UA: 0.2

## 2011-02-19 LAB — GIARDIA/CRYPTOSPORIDIUM SCREEN(EIA): Giardia Screen - EIA: NEGATIVE

## 2011-02-19 LAB — BASIC METABOLIC PANEL
CO2: 23
Calcium: 8.2 — ABNORMAL LOW
Creatinine, Ser: 0.84
GFR calc Af Amer: >60
Glucose, Bld: 86

## 2011-02-19 LAB — CULTURE, BLOOD (ROUTINE X 2): Culture: NO GROWTH

## 2011-02-19 LAB — COMPREHENSIVE METABOLIC PANEL
AST: 38 — ABNORMAL HIGH
BUN: 10
CO2: 28
Chloride: 99
Creatinine, Ser: 1.01
GFR calc Af Amer: >60
GFR calc non Af Amer: >60
Glucose, Bld: 114 — ABNORMAL HIGH
Sodium: 134 — ABNORMAL LOW
Total Bilirubin: 0.9

## 2011-02-19 LAB — DIFFERENTIAL
Basophils Absolute: 0
Eosinophils Relative: 0
Lymphocytes Relative: 5 — ABNORMAL LOW
Neutro Abs: 7.8 — ABNORMAL HIGH
Neutrophils Relative %: 90 — ABNORMAL HIGH

## 2011-02-19 LAB — GRAM STAIN: Gram Stain: NONE SEEN

## 2011-02-19 LAB — CLOSTRIDIUM DIFFICILE EIA

## 2011-02-19 LAB — LIPASE, BLOOD: Lipase: 17

## 2011-02-19 LAB — MAGNESIUM: Magnesium: 2

## 2011-02-19 LAB — URINE CULTURE

## 2011-02-19 LAB — OCCULT BLOOD X 1 CARD TO LAB, STOOL: Fecal Occult Bld: POSITIVE

## 2011-04-06 ENCOUNTER — Encounter: Payer: Medicaid Other | Admitting: Internal Medicine

## 2011-04-12 ENCOUNTER — Ambulatory Visit (INDEPENDENT_AMBULATORY_CARE_PROVIDER_SITE_OTHER): Payer: Medicaid Other | Admitting: Internal Medicine

## 2011-04-12 DIAGNOSIS — M949 Disorder of cartilage, unspecified: Secondary | ICD-10-CM

## 2011-04-12 DIAGNOSIS — K219 Gastro-esophageal reflux disease without esophagitis: Secondary | ICD-10-CM

## 2011-04-12 DIAGNOSIS — E039 Hypothyroidism, unspecified: Secondary | ICD-10-CM

## 2011-04-12 DIAGNOSIS — J45909 Unspecified asthma, uncomplicated: Secondary | ICD-10-CM

## 2011-04-12 DIAGNOSIS — M899 Disorder of bone, unspecified: Secondary | ICD-10-CM

## 2011-04-12 DIAGNOSIS — M069 Rheumatoid arthritis, unspecified: Secondary | ICD-10-CM

## 2011-04-12 LAB — COMPREHENSIVE METABOLIC PANEL
AST: 25 U/L (ref 0–37)
Albumin: 3.9 g/dL (ref 3.5–5.2)
Alkaline Phosphatase: 68 U/L (ref 39–117)
BUN: 20 mg/dL (ref 6–23)
Calcium: 9.3 mg/dL (ref 8.4–10.5)
Potassium: 4.8 mEq/L (ref 3.5–5.3)
Sodium: 141 mEq/L (ref 135–145)
Total Bilirubin: 0.3 mg/dL (ref 0.3–1.2)

## 2011-04-12 LAB — TSH: TSH: 10.923 u[IU]/mL — ABNORMAL HIGH (ref 0.350–4.500)

## 2011-04-12 NOTE — Assessment & Plan Note (Signed)
Advised patient to continue with PPI daily and not intermittently to help with indigestion symptoms.

## 2011-04-12 NOTE — Assessment & Plan Note (Signed)
Check thyroid function tests and adjust medication as needed. Currently asymptomatic.  Lab Results  Component Value Date   TSH 0.023* 05/14/2010

## 2011-04-12 NOTE — Progress Notes (Signed)
  Subjective:    Patient ID: Cindy Oconnor, female    DOB: 19-Mar-1971, 40 y.o.   MRN: [id number]  HPI  Cindy Oconnor is a pleasant 40 year old woman that is very familiar to me with a past medical history significant for Down's syndrome, hypothyroidism, rheumatoid arthritis and asthma who presents today for a regular followup. She presents with her mother who brings in paperwork regarding placement. Patient will be transferring to another group home that will be closer to her mother. Patient does not have any complaints or concerns today and is feeling well. She does note occasional indigestion and her mother does admit to not taking PPI daily.  Review of Systems  Constitutional: Negative for fever and fatigue.  Gastrointestinal: Negative for nausea, vomiting, diarrhea and constipation.       Objective:   Physical Exam  Constitutional: She is oriented to person, place, and time. She appears well-developed.  HENT:  Head: Normocephalic.  Eyes: Pupils are equal, round, and reactive to light.  Neck: Normal range of motion.  Cardiovascular: Normal rate and regular rhythm.   Pulmonary/Chest: Effort normal and breath sounds normal. She has no wheezes.  Abdominal: Soft. Bowel sounds are normal.  Musculoskeletal: Normal range of motion. She exhibits no edema.  Neurological: She is alert and oriented to person, place, and time.  Psychiatric: She has a normal mood and affect.          Assessment & Plan:

## 2011-04-12 NOTE — Assessment & Plan Note (Signed)
Currently stable with current therapy of Symbicort, Spiriva and prednisone.

## 2011-04-12 NOTE — Assessment & Plan Note (Signed)
Patient has been on methotrexate therapy prescribed by her rheumatologist. Usually her labs are checked there. We will go ahead and perform CBC as patient will be artery having a TSH drawn. Continue current therapy.

## 2011-04-12 NOTE — Patient Instructions (Signed)
Please follow up in 3 months.  

## 2011-04-12 NOTE — Assessment & Plan Note (Signed)
Continue vitamin C, calcium and alendronate. No recent fractures.

## 2011-04-13 LAB — CBC WITH DIFFERENTIAL/PLATELET
Basophils Absolute: 0 10*3/uL (ref 0.0–0.1)
Basophils Relative: 0 % (ref 0–1)
HCT: 42.4 % (ref 36.0–46.0)
MCHC: 32.8 g/dL (ref 30.0–36.0)
Monocytes Absolute: 0.4 10*3/uL (ref 0.1–1.0)
Neutro Abs: 5.6 10*3/uL (ref 1.7–7.7)
Neutrophils Relative %: 78 % — ABNORMAL HIGH (ref 43–77)
RDW: 15.4 % (ref 11.5–15.5)

## 2011-04-27 ENCOUNTER — Other Ambulatory Visit (INDEPENDENT_AMBULATORY_CARE_PROVIDER_SITE_OTHER): Payer: Medicaid Other

## 2011-04-27 ENCOUNTER — Other Ambulatory Visit: Payer: Self-pay | Admitting: *Deleted

## 2011-04-27 DIAGNOSIS — E039 Hypothyroidism, unspecified: Secondary | ICD-10-CM

## 2011-04-27 LAB — T4, FREE: Free T4: 1.07 ng/dL (ref 0.80–1.80)

## 2011-04-27 MED ORDER — ASPIRIN 81 MG PO TABS: 81.0000 mg | ORAL_TABLET | Freq: Every day | ORAL | Status: DC

## 2011-04-27 MED ORDER — BUDESONIDE-FORMOTEROL FUMARATE 160-4.5 MCG/ACT IN AERO: 2.0000 | INHALATION_SPRAY | Freq: Two times a day (BID) | RESPIRATORY_TRACT | Status: DC

## 2011-04-27 MED ORDER — CALCIUM CARBONATE-VITAMIN D 500-200 MG-UNIT PO TABS: 1.0000 | ORAL_TABLET | Freq: Every day | ORAL | Status: DC

## 2011-04-27 MED ORDER — MESALAMINE 400 MG PO TBEC: 400.0000 mg | DELAYED_RELEASE_TABLET | Freq: Three times a day (TID) | ORAL | Status: DC

## 2011-04-27 MED ORDER — TIOTROPIUM BROMIDE MONOHYDRATE 18 MCG IN CAPS: 18.0000 ug | ORAL_CAPSULE | Freq: Every day | RESPIRATORY_TRACT | Status: DC

## 2011-04-27 MED ORDER — ONDANSETRON 4 MG PO TBDP: 4.0000 mg | ORAL_TABLET | Freq: Three times a day (TID) | ORAL | Status: DC | PRN

## 2011-04-27 MED ORDER — FOLIC ACID 1 MG PO TABS: 1.0000 mg | ORAL_TABLET | Freq: Every day | ORAL | Status: DC

## 2011-04-27 MED ORDER — OMEPRAZOLE 40 MG PO CPDR: 40.0000 mg | DELAYED_RELEASE_CAPSULE | Freq: Every day | ORAL | Status: DC

## 2011-04-27 MED ORDER — LEVOTHYROXINE SODIUM 100 MCG PO TABS: 100.0000 ug | ORAL_TABLET | Freq: Every day | ORAL | Status: DC

## 2011-04-27 MED ORDER — ALENDRONATE SODIUM 70 MG PO TABS: 70.0000 mg | ORAL_TABLET | ORAL | Status: DC

## 2011-04-27 MED ORDER — LACTULOSE 10 GM/15ML PO SOLN: 20.0000 g | ORAL | Status: DC | PRN

## 2011-04-27 MED ORDER — BENZONATATE 100 MG PO CAPS: 100.0000 mg | ORAL_CAPSULE | Freq: Three times a day (TID) | ORAL | Status: DC | PRN

## 2011-04-27 MED ORDER — DIPHENHYDRAMINE HCL 25 MG PO TABS: 25.0000 mg | ORAL_TABLET | Freq: Four times a day (QID) | ORAL | Status: DC | PRN

## 2011-04-27 MED ORDER — METHOTREXATE 2.5 MG PO TABS: 10.0000 mg | ORAL_TABLET | ORAL | Status: DC

## 2011-04-27 MED ORDER — PREDNISONE 2.5 MG PO TABS: 5.0000 mg | ORAL_TABLET | Freq: Every day | ORAL | Status: DC

## 2011-04-27 MED ORDER — GUAIFENESIN 400 MG PO TABS: 400.0000 mg | ORAL_TABLET | ORAL | Status: DC

## 2011-04-27 NOTE — Telephone Encounter (Signed)
Pt is changing group homes and must have new scripts, [facility] in [place], ph: [phone], fax: [phone]- they do not have escript, so all will have to be faxed or called

## 2011-05-20 ENCOUNTER — Telehealth: Payer: Self-pay | Admitting: *Deleted

## 2011-05-20 NOTE — Telephone Encounter (Signed)
Caregiver at facility calls to say she has questions, attempted to call back, left vmail

## 2011-07-30 ENCOUNTER — Encounter: Payer: Medicaid Other | Admitting: Internal Medicine

## 2011-08-05 ENCOUNTER — Encounter: Payer: Medicaid Other | Admitting: Internal Medicine

## 2011-08-05 ENCOUNTER — Ambulatory Visit (INDEPENDENT_AMBULATORY_CARE_PROVIDER_SITE_OTHER): Payer: Medicaid Other | Admitting: Internal Medicine

## 2011-08-05 VITALS — BP 113/74 | HR 85 | Temp 97.2°F | Wt 175.6 lb

## 2011-08-05 DIAGNOSIS — I10 Essential (primary) hypertension: Secondary | ICD-10-CM

## 2011-08-05 DIAGNOSIS — M069 Rheumatoid arthritis, unspecified: Secondary | ICD-10-CM

## 2011-08-05 DIAGNOSIS — Z299 Encounter for prophylactic measures, unspecified: Secondary | ICD-10-CM | POA: Insufficient documentation

## 2011-08-05 NOTE — Assessment & Plan Note (Signed)
Tetanus shot today.

## 2011-08-05 NOTE — Progress Notes (Signed)
  Subjective:    Patient ID: Cindy Oconnor, female    DOB: 03-Feb-1971, 41 y.o.   MRN: [id number]  HPI Cindy Oconnor is here today brought in by her care taker for a regular check up.  She was started on methotrexate for her rheumatoid arthritis. We need to do follow up labs today to look for any toxicity.  Patient has down syndrome and wants to participate in special olympics. 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 We do not have any imaging studies documenting the stability of atlanto axial joint and hence I have written down that she should not participate in any activities requiring her atlantoaxial joint being on risk of disluxation (already mentioned in the form).  No complaints at all.  Patient needs tetanus shot today.    Review of Systems  All other systems reviewed and are negative.       Objective:   Physical Exam  Constitutional: She is oriented to person, place, and time. She appears well-developed and well-nourished.  HENT:  Head: Normocephalic and atraumatic.  Eyes: Conjunctivae and EOM are normal. Pupils are equal, round, and reactive to light. No scleral icterus.  Neck: Normal range of motion. Neck supple. No JVD present. No thyromegaly present.  Cardiovascular: Normal rate, regular rhythm, normal heart sounds and intact distal pulses.  Exam reveals no gallop and no friction rub.   No murmur heard. Pulmonary/Chest: Effort normal and breath sounds normal. No respiratory distress. She has no wheezes. She has no rales.  Abdominal: Soft. Bowel sounds are normal. She exhibits no distension and no mass. There is no tenderness. There is no rebound and no guarding.  Musculoskeletal: Normal range of motion. She exhibits no edema and no tenderness.  Lymphadenopathy:    She has no cervical adenopathy.  Neurological: She is alert and oriented to person, place, and time.  Psychiatric: She has a normal mood and affect. Her  behavior is normal.          Assessment & Plan:

## 2011-08-05 NOTE — Assessment & Plan Note (Addendum)
Patient started on methotrexate. I will check CBC and CMP to check for bone marrow depression or hepatic abnormalities. Patient will need 3 month blood work check up and which was mentioned to the care taker from the nursing home. No pain reported.

## 2011-08-05 NOTE — Patient Instructions (Signed)
Tetanus and Diphtheria Vaccine Your caregiver has suggested that you receive an immunization to prevent tetanus (lockjaw) and diphtheria. Tetanus and diphtheria are serious and deadly infectious diseases of the past that have been nearly wiped out by modern immunizations. Td or DT vaccines (shots) are the immunizations given to help prevent these illnesses. Td is the medical term for a standard tetanus dose, small diphtheria dose. DT means both in standard doses. ABOUT THE DISEASES Tetanus (lockjaw) and diphtheria are serious diseases. Tetanus is caused by a germ that lives in the soil. It enters the body through a cut or wound, often caused by a nail or broken piece of glass. You cannot catch tetanus from another person. Diphtheria spreads when germs pass from an infected person to the nose or throat of others. Tetanus causes serious, painful spasms of all muscles. It can lead to:  "Locking" of the muscles of the jaw and throat, so the patient cannot open his or her mouth or swallow.   Damage to the heart muscle.  Diphtheria causes a thick coating in the nose, throat, or airway. It can lead to:  Breathing problems.   Kidney problems.   Heart failure.   Paralysis.   Death.  ABOUT THE VACCINES  A vaccine is a shot (immunization) that can help prevent a disease. Vaccines have helped lower the rates of getting certain diseases. If people stopped getting vaccinated, more people would develop illnesses. These vaccines can be used in three ways:  As catch-up for people who did not get all their doses when they were children.   As a booster dose every 10 years.   For protection against tetanus infection, after a wound.  Benefits of the vaccines Vaccination is the best way to protect against tetanus and diphtheria. Because of vaccination, there are fewer cases of these diseases. Cases are rare in children because most get a routine vaccination with DTP (Diphtheria, Tetanus, and Pertussis), DTaP  (Diphtheria, Tetanus, and acellular Pertussis), or DT (Diphtheria and Tetanus) vaccines. There would be many more cases if we stopped vaccinating people. Tetanus kills about 1 in 5 people who are infected. WHEN SHOULD YOU GET TD VACCINE?  Td is made for people 43 years of age and older.   People who have not gotten at least 3 doses of any tetanus and diphtheria vaccine (DTP, DTaP or DT) during their lifetime should do so using Td. After a person gets the third dose, a Td dose is needed every 10 years all through life. This is because protection fades over time. Booster shots are needed every 10 years.   Other vaccines may be given at the same time as Td.  You may not know today whether your immunizations are current. The vaccine given today is to protect you from your next cut or injury. It does not offer protection for the current injury. An immune globulin injection may be given, if protection is needed immediately. Check with your caregiver later regarding your immunization status. Tell your caregiver if the person getting the vaccine:  Has ever had a serious allergic reaction or other problem with Td, or any other tetanus and diphtheria vaccine (DTP, DTaP, or DT). People who have had a serious allergic reaction should not receive the vaccine.   Has epilepsy or another nervous system illness.   Has had Guillain Barre Syndrome (GBS) in the past.   Now has a moderate or severe illness.   Is pregnant.   If you are not sure, ask  your caregiver.  WHAT ARE THE RISKS FROM TD VACCINE?  As with any medicine, there are very small risks that serious problems, even death, could occur after getting a vaccine. However, the risk of a serious side effect from the vaccine is almost zero.   The risks from the vaccine are much smaller than the risks from the diseases, if people stopped getting vaccinated. Both diseases can cause serious health problems, which are prevented by the vaccine.   Almost all  people who get Td have no problems from it.  Mild problems If mild problems occur, they usually start within hours to a day or two after vaccination. They may last 1-2 days:  Soreness, redness, or swelling where the shot was given.   Headache or tiredness.   Occasionally, a low grade fever.  These problems can be worse in adults who get Td vaccine very often. Non-aspirin medicines may be used to reduce soreness. Severe problems These problems happen very rarely:  Serious allergic reaction (at most, occurs in 1 in 1 million vaccinated persons). This occurs almost immediately, and is treatable with medicines. Signs of a serious allergic reaction include:   Difficulty breathing.   Hoarseness or wheezing.   Hives.   Dizziness.   Deep, aching pain and muscle wasting in upper arm(s).  Overall, the benefits to you and your family from these vaccines are far greater than the risk. WHAT TO DO IF THERE IS A SERIOUS REACTION:  Call a caregiver or get the person to a doctor or emergency room right away.   Write down what happened, the date and time it happened, and tell your caregiver.   Ask your caregiver to file a Vaccine Adverse Event Report form or call, toll-free: [phone]  If you want to learn more about this vaccine, ask your caregiver. She/he can give you the vaccine package insert or suggest other sources of information. Also, the [employer] gives compensation (payment) for persons thought to be injured by vaccines. For details call, toll-free: [phone]) [phone]. Document Released: 05/14/2000 Document Revised: 05/06/2011 Document Reviewed: 04/03/2009 [facility] Patient Information 2012 [place].

## 2011-08-06 LAB — COMPLETE METABOLIC PANEL WITH GFR
ALT: 21 U/L (ref 0–35)
CO2: 28 mEq/L (ref 19–32)
Calcium: 9.1 mg/dL (ref 8.4–10.5)
Chloride: 105 mEq/L (ref 96–112)
Creat: 1.06 mg/dL (ref 0.50–1.10)
GFR, Est African American: 75 mL/min
GFR, Est Non African American: 65 mL/min
Glucose, Bld: 101 mg/dL — ABNORMAL HIGH (ref 70–99)
Sodium: 143 mEq/L (ref 135–145)
Total Bilirubin: 0.4 mg/dL (ref 0.3–1.2)
Total Protein: 6.2 g/dL (ref 6.0–8.3)

## 2011-08-06 LAB — CBC
MCH: 36 pg — ABNORMAL HIGH (ref 26.0–34.0)
MCHC: 32.5 g/dL (ref 30.0–36.0)
Platelets: 299 10*3/uL (ref 150–400)
RDW: 15.4 % (ref 11.5–15.5)

## 2011-10-07 ENCOUNTER — Telehealth: Payer: Self-pay | Admitting: *Deleted

## 2011-10-07 ENCOUNTER — Encounter: Payer: Self-pay | Admitting: Internal Medicine

## 2011-10-07 NOTE — Telephone Encounter (Signed)
Will fax to [phone]  Att: Abigail Butts

## 2011-10-07 NOTE — Telephone Encounter (Signed)
Call from pt's mother.  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 She states her lupus has not been active in years. Pt is going to a job interview today at 2:00  And would like the note by then. Job is requiring this in writing.   Hx: Down's # [id number]

## 2011-10-20 ENCOUNTER — Telehealth: Payer: Self-pay | Admitting: *Deleted

## 2011-10-20 NOTE — Telephone Encounter (Signed)
Call from  [facility] the [place] Director,  # [phone]  She is calling to inform us pt got sick over the week end and was started on Septra for 14 days by PA at [facility] in [place]. Her concern is that pt is also on  Methotrexate 2.5 mg tabs # 4 and receives her dose on Friday each week. She wants to be sure the septra will not interact with methotrexate.  Please advise

## 2011-10-22 ENCOUNTER — Telehealth: Payer: Self-pay | Admitting: *Deleted

## 2011-10-22 NOTE — Telephone Encounter (Signed)
Call for Prior Authorization for Delzicol.  Denied pt will need to try the preferred drugs Apriso, Penpasa or Sulfasalazine first.  Sander Nephew, RN 10/22/2011 10:42 AM.

## 2011-10-28 ENCOUNTER — Other Ambulatory Visit: Payer: Self-pay | Admitting: Internal Medicine

## 2011-10-28 MED ORDER — MESALAMINE ER 0.375 G PO CP24: 375.0000 mg | ORAL_CAPSULE | Freq: Every day | ORAL | Status: DC

## 2011-10-28 NOTE — Progress Notes (Signed)
Rx for Apriso called into [facility]  as instructed [phone] per Dr Ina Homes. . Also talked with Jacqlyn Larsen  [phone] about med per Dr Ina Homes. Hilda Blades Earleen Aoun RN 10/28/11 2:40PM

## 2011-12-03 ENCOUNTER — Inpatient Hospital Stay: Payer: Self-pay | Admitting: Internal Medicine

## 2011-12-03 LAB — CBC
HCT: 40.9 % (ref 35.0–47.0)
MCHC: 32.5 g/dL (ref 32.0–36.0)
Platelet: 273 10*3/uL (ref 150–440)
RBC: 3.76 10*6/uL — ABNORMAL LOW (ref 3.80–5.20)
WBC: 6 10*3/uL (ref 3.6–11.0)

## 2011-12-03 LAB — BASIC METABOLIC PANEL
Anion Gap: 6 — ABNORMAL LOW (ref 7–16)
BUN: 21 mg/dL — ABNORMAL HIGH (ref 7–18)
Chloride: 107 mmol/L (ref 98–107)
Co2: 31 mmol/L (ref 21–32)
Creatinine: 1.16 mg/dL (ref 0.60–1.30)
EGFR (African American): >60
Glucose: 116 mg/dL — ABNORMAL HIGH (ref 65–99)
Potassium: 4.2 mmol/L (ref 3.5–5.1)

## 2011-12-06 LAB — BASIC METABOLIC PANEL
Chloride: 107 mmol/L (ref 98–107)
Co2: 28 mmol/L (ref 21–32)
Creatinine: 0.78 mg/dL (ref 0.60–1.30)
EGFR (African American): >60
Glucose: 130 mg/dL — ABNORMAL HIGH (ref 65–99)
Potassium: 3.9 mmol/L (ref 3.5–5.1)
Sodium: 145 mmol/L (ref 136–145)

## 2011-12-06 LAB — EXPECTORATED SPUTUM ASSESSMENT W GRAM STAIN, RFLX TO RESP C

## 2011-12-09 LAB — CULTURE, BLOOD (SINGLE)

## 2011-12-13 ENCOUNTER — Telehealth: Payer: Self-pay | Admitting: Internal Medicine

## 2011-12-13 NOTE — Telephone Encounter (Signed)
Cindy Oconnor called about [name]  She was in [facility] July 5 and was dischaged 7/12 for asthma and her bp kept going down  Mom wanted to know if it was ok to wait till 7/31 for new pt appointment.   Pt has down syndrome Mom is aware dr walker will be on vac the week of 7/22

## 2011-12-13 NOTE — Telephone Encounter (Signed)
Patients mom advised as instructed via telephone.

## 2011-12-13 NOTE — Telephone Encounter (Signed)
Fine to wait until 7/31 (no appointments prior). If emergent symptoms, ie worsening shortness of breath, etc, then would need ED or previous PCP prior to appointment.

## 2011-12-29 ENCOUNTER — Ambulatory Visit: Payer: Medicaid Other | Admitting: Internal Medicine

## 2012-01-21 ENCOUNTER — Ambulatory Visit (INDEPENDENT_AMBULATORY_CARE_PROVIDER_SITE_OTHER): Payer: Medicaid Other | Admitting: Internal Medicine

## 2012-01-21 ENCOUNTER — Other Ambulatory Visit: Payer: Self-pay | Admitting: Internal Medicine

## 2012-01-21 ENCOUNTER — Encounter: Payer: Self-pay | Admitting: Internal Medicine

## 2012-01-21 VITALS — BP 112/82 | HR 66 | Temp 98.2°F | Ht <= 58 in | Wt 185.0 lb

## 2012-01-21 DIAGNOSIS — L0293 Carbuncle, unspecified: Secondary | ICD-10-CM

## 2012-01-21 DIAGNOSIS — M069 Rheumatoid arthritis, unspecified: Secondary | ICD-10-CM

## 2012-01-21 DIAGNOSIS — Q909 Down syndrome, unspecified: Secondary | ICD-10-CM

## 2012-01-21 DIAGNOSIS — L0292 Furuncle, unspecified: Secondary | ICD-10-CM

## 2012-01-21 DIAGNOSIS — E039 Hypothyroidism, unspecified: Secondary | ICD-10-CM

## 2012-01-21 DIAGNOSIS — E663 Overweight: Secondary | ICD-10-CM | POA: Insufficient documentation

## 2012-01-21 DIAGNOSIS — J45909 Unspecified asthma, uncomplicated: Secondary | ICD-10-CM

## 2012-01-21 DIAGNOSIS — E669 Obesity, unspecified: Secondary | ICD-10-CM

## 2012-01-21 DIAGNOSIS — N61 Mastitis without abscess: Secondary | ICD-10-CM

## 2012-01-21 MED ORDER — GENTAMICIN SULFATE 0.1 % EX OINT: TOPICAL_OINTMENT | Freq: Three times a day (TID) | CUTANEOUS | Status: AC

## 2012-01-21 NOTE — Assessment & Plan Note (Signed)
Some recent weight gain. Will check her recent lab work including TSH which was drawn at [facility].

## 2012-01-21 NOTE — Assessment & Plan Note (Signed)
Currently doing well living in a group home. Will continue to monitor. Will get records on previous evaluation and management.

## 2012-01-21 NOTE — Patient Instructions (Addendum)
Goal:  1. Walk 15-80mn per day.  2. Eliminate sugared drinks.  Email with questions  [email or web address]

## 2012-01-21 NOTE — Assessment & Plan Note (Signed)
Currently asymptomatic. Will continue inhaled bronchodilators and steroids.

## 2012-01-21 NOTE — Assessment & Plan Note (Signed)
BMI 39. Set goal today of eliminating sugared beverages and walking 15-30 minutes 3 days per week. Followup 3 months.

## 2012-01-21 NOTE — Assessment & Plan Note (Signed)
Based on notes from other physicians, it appears that she has been on methotrexate and prednisone for at least 5 months. She had recent blood work at [facility] including CBC. Will request records on this. Will also request records from Dr. Jefm Bryant in rheumatology. Follow up 3 months.

## 2012-01-21 NOTE — Assessment & Plan Note (Signed)
Boils noted on bilateral breasts. Lesions are are ready open and draining. Culture taken today. Will start topical gentamicin. Followup 3 months or sooner as needed.

## 2012-01-21 NOTE — Progress Notes (Signed)
Subjective:    Patient ID: Cindy Oconnor, female    DOB: 03/03/1971, 41 y.o.   MRN: [id number]  HPI 41 year old female with history of Down's syndrome presents to establish care. She currently lives at a group home and presents with one of her caregivers. She reports she is generally doing well. 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 Her caregiver is concerned because she is put on a significant amount of weight over the last few months. He attributes this in part to the cooking class and in part to increase intake of sweet and beverages at home. She is also sedentary and does not exercise.  She has significant arthritis in her hands and feet. She had been followed in the past by rheumatology but it is not clear when she was last evaluated. She is currently taking methotrexate and prednisone. She was recently evaluated during the hospital admission and decision was made to continue with these medications. She has not recently complained of pain in her hands or feet.  She also has a history of asthma for which she takes Symbicort, spiriva, and uses albuterol as needed. Currently she is asymptomatic.  She is also concerned today about lesions on her breasts. She reports occasional development of pustular lesions on her breast or in her groin. She is not currently being treated for this.  Outpatient Encounter Prescriptions as of 01/21/2012  Medication Sig Dispense Refill  . benzonatate (TESSALON) 100 MG capsule Take 1 capsule (100 mg total) by mouth 3 (three) times daily as needed.  20 capsule  3  . budesonide-formoterol (SYMBICORT) 160-4.5 MCG/ACT inhaler Inhale 2 puffs into the lungs 2 (two) times daily.  1 Inhaler  11  . diphenhydrAMINE (BENADRYL) 25 MG tablet Take 1 tablet (25 mg total) by mouth every 6 (six) hours as needed.  30 tablet  11  . folic acid (FOLVITE) 1 MG tablet Take 1 tablet (1 mg total) by mouth daily.  30 tablet  11  . guaifenesin (HUMIBID  E) 400 MG TABS Take 1 tablet (400 mg total) by mouth every 4 (four) hours.  56 tablet  11  . lactulose (CHRONULAC) 10 GM/15ML solution Take 30 g by mouth 2 (two) times daily.      Marland Kitchen levalbuterol (XOPENEX) 0.63 MG/3ML nebulizer solution Take 1 ampule by nebulization 3 (three) times daily as needed.      Marland Kitchen levothyroxine (SYNTHROID, LEVOTHROID) 100 MCG tablet Take 1 tablet (100 mcg total) by mouth daily.  30 tablet  11  . methotrexate (RHEUMATREX) 2.5 MG tablet Take 4 tablets (10 mg total) by mouth once a week. Caution:Chemotherapy. Protect from light.  4 tablet  2  . Multiple Vitamin (MULTIVITAMIN) tablet Take 1 tablet by mouth daily.      Marland Kitchen omeprazole (PRILOSEC) 40 MG capsule Take 1 capsule (40 mg total) by mouth daily.  31 capsule  11  . ondansetron (ZOFRAN-ODT) 4 MG disintegrating tablet Take 1 tablet (4 mg total) by mouth every 8 (eight) hours as needed.  20 tablet  6  . predniSONE (DELTASONE) 2.5 MG tablet Take 2 tablets (5 mg total) by mouth daily.  60 tablet  11  . tiotropium (SPIRIVA) 18 MCG inhalation capsule Place 1 capsule (18 mcg total) into inhaler and inhale daily.  30 capsule  11  . gentamicin ointment (GARAMYCIN) 0.1 % Apply topically 3 (three) times daily.  15 g  0   BP 112/82  Pulse 66  Temp 98.2 F (  36.8 C) (Oral)  Ht 4' 9.25" (1.454 m)  Wt 185 lb (83.915 kg)  BMI 39.68 kg/m2  SpO2 99%  LMP 04/11/1981  Review of Systems  Constitutional: Negative for fever, chills, appetite change, fatigue and unexpected weight change.  HENT: Negative for ear pain, congestion, sore throat, trouble swallowing, neck pain, voice change and sinus pressure.   Eyes: Negative for visual disturbance.  Respiratory: Negative for cough, shortness of breath, wheezing and stridor.   Cardiovascular: Negative for chest pain, palpitations and leg swelling.  Gastrointestinal: Negative for nausea, vomiting, abdominal pain, diarrhea, constipation, blood in stool, abdominal distention and anal bleeding.    Genitourinary: Negative for dysuria and flank pain.  Musculoskeletal: Negative for myalgias, arthralgias and gait problem.  Skin: Positive for color change and rash.  Neurological: Negative for dizziness and headaches.  Hematological: Negative for adenopathy. Does not bruise/bleed easily.  Psychiatric/Behavioral: Negative for suicidal ideas, disturbed wake/sleep cycle and dysphoric mood. The patient is not nervous/anxious.        Objective:   Physical Exam  Constitutional: She is oriented to person, place, and time. She appears well-developed and well-nourished. No distress.  HENT:  Head: Normocephalic and atraumatic.  Right Ear: External ear normal.  Left Ear: External ear normal.  Nose: Nose normal.  Mouth/Throat: Oropharynx is clear and moist. No oropharyngeal exudate.  Eyes: Conjunctivae are normal. Pupils are equal, round, and reactive to light. Right eye exhibits no discharge. Left eye exhibits no discharge. No scleral icterus.  Neck: Normal range of motion. Neck supple. No tracheal deviation present. No thyromegaly present.  Cardiovascular: Normal rate, regular rhythm, normal heart sounds and intact distal pulses.  Exam reveals no gallop and no friction rub.   No murmur heard. Pulmonary/Chest: Effort normal and breath sounds normal. No respiratory distress. She has no wheezes. She has no rales. She exhibits no tenderness.  Abdominal: Soft. Bowel sounds are normal. She exhibits no distension. There is tenderness (slight epigastric). There is no rebound.  Musculoskeletal: Normal range of motion. She exhibits no edema and no tenderness.  Lymphadenopathy:    She has no cervical adenopathy.  Neurological: She is alert and oriented to person, place, and time. No cranial nerve deficit. She exhibits normal muscle tone. Coordination normal.  Skin: Skin is warm and dry. Lesion (pustules right medial and left medial breast) noted. No rash noted. She is not diaphoretic. There is erythema. No  pallor.  Psychiatric: She has a normal mood and affect. Her speech is normal and behavior is normal. Thought content normal. Cognition and memory are impaired. She expresses impulsivity.          Assessment & Plan:

## 2012-01-24 LAB — WOUND CULTURE

## 2012-04-20 ENCOUNTER — Encounter: Payer: Self-pay | Admitting: Internal Medicine

## 2012-04-20 ENCOUNTER — Ambulatory Visit (INDEPENDENT_AMBULATORY_CARE_PROVIDER_SITE_OTHER): Payer: Medicaid Other | Admitting: Internal Medicine

## 2012-04-20 ENCOUNTER — Telehealth: Payer: Self-pay | Admitting: Internal Medicine

## 2012-04-20 VITALS — BP 112/80 | HR 73 | Temp 98.1°F | Resp 16 | Ht <= 58 in | Wt 174.2 lb

## 2012-04-20 DIAGNOSIS — E039 Hypothyroidism, unspecified: Secondary | ICD-10-CM

## 2012-04-20 DIAGNOSIS — E669 Obesity, unspecified: Secondary | ICD-10-CM

## 2012-04-20 DIAGNOSIS — Z0000 Encounter for general adult medical examination without abnormal findings: Secondary | ICD-10-CM | POA: Insufficient documentation

## 2012-04-20 DIAGNOSIS — L0292 Furuncle, unspecified: Secondary | ICD-10-CM

## 2012-04-20 DIAGNOSIS — L0293 Carbuncle, unspecified: Secondary | ICD-10-CM

## 2012-04-20 DIAGNOSIS — M069 Rheumatoid arthritis, unspecified: Secondary | ICD-10-CM

## 2012-04-20 LAB — CBC WITH DIFFERENTIAL/PLATELET
Basophils Relative: 0.3 % (ref 0.0–3.0)
Eosinophils Relative: 0.1 % (ref 0.0–5.0)
HCT: 42.3 % (ref 36.0–46.0)
Lymphs Abs: 0.9 10*3/uL (ref 0.7–4.0)
MCV: 106.5 fl — ABNORMAL HIGH (ref 78.0–100.0)
Monocytes Absolute: 0.2 10*3/uL (ref 0.1–1.0)
Monocytes Relative: 2.7 % — ABNORMAL LOW (ref 3.0–12.0)
RBC: 3.97 Mil/uL (ref 3.87–5.11)
WBC: 7.1 10*3/uL (ref 4.5–10.5)

## 2012-04-20 LAB — COMPREHENSIVE METABOLIC PANEL
Albumin: 4 g/dL (ref 3.5–5.2)
BUN: 17 mg/dL (ref 6–23)
CO2: 30 mEq/L (ref 19–32)
Calcium: 9.6 mg/dL (ref 8.4–10.5)
Chloride: 103 mEq/L (ref 96–112)
GFR: 69.72 mL/min (ref >60.00)
Glucose, Bld: 112 mg/dL — ABNORMAL HIGH (ref 70–99)
Potassium: 4.5 mEq/L (ref 3.5–5.1)

## 2012-04-20 LAB — LIPID PANEL
Cholesterol: 180 mg/dL (ref 0–200)
Triglycerides: 77 mg/dL (ref 0.0–149.0)

## 2012-04-20 MED ORDER — DOXYCYCLINE HYCLATE 100 MG PO TABS: 100.0000 mg | ORAL_TABLET | Freq: Two times a day (BID) | ORAL | Status: DC

## 2012-04-20 NOTE — Progress Notes (Signed)
Subjective:    Patient ID: Cindy Oconnor, female    DOB: 1970-08-14, 41 y.o.   MRN: [id number]  HPI 41 year old female with history of Down syndrome, obesity, hypothyroidism, rheumatoid arthritis presents for annual exam. Since her last visit, she has a laminated sweetened beverages and has lost 11 pounds. Physical activity continues to be limited by arthritis in her joints and pain associated with this arthritis. She is not currently followed by rheumatologist. In the past, she was followed by local rheumatologist and she has been taking prednisone and methotrexate for several years. She reports that she is feeling well. She is concerned today about some skin lesions in her groin. Aside from this, she reports normal energy level and appetite. She denies any new concerns.  Outpatient Encounter Prescriptions as of 04/20/2012  Medication Sig Dispense Refill  . benzonatate (TESSALON) 100 MG capsule Take 1 capsule (100 mg total) by mouth 3 (three) times daily as needed.  20 capsule  3  . budesonide-formoterol (SYMBICORT) 160-4.5 MCG/ACT inhaler Inhale 2 puffs into the lungs 2 (two) times daily.  1 Inhaler  11  . diphenhydrAMINE (BENADRYL) 25 MG tablet Take 1 tablet (25 mg total) by mouth every 6 (six) hours as needed.  30 tablet  11  . folic acid (FOLVITE) 1 MG tablet Take 1 tablet (1 mg total) by mouth daily.  30 tablet  11  . gentamicin ointment (GARAMYCIN) 0.1 % Apply topically 3 (three) times daily.  15 g  0  . guaifenesin (HUMIBID E) 400 MG TABS Take 1 tablet (400 mg total) by mouth every 4 (four) hours.  56 tablet  11  . lactulose (CHRONULAC) 10 GM/15ML solution Take 30 g by mouth 2 (two) times daily.      Marland Kitchen levalbuterol (XOPENEX) 0.63 MG/3ML nebulizer solution Take 1 ampule by nebulization 3 (three) times daily as needed.      Marland Kitchen levothyroxine (SYNTHROID, LEVOTHROID) 100 MCG tablet Take 1 tablet (100 mcg total) by mouth daily.  30 tablet  11  . methotrexate (RHEUMATREX) 2.5 MG tablet Take 4  tablets (10 mg total) by mouth once a week. Caution:Chemotherapy. Protect from light.  4 tablet  2  . Multiple Vitamin (MULTIVITAMIN) tablet Take 1 tablet by mouth daily.      Marland Kitchen omeprazole (PRILOSEC) 40 MG capsule Take 1 capsule (40 mg total) by mouth daily.  31 capsule  11  . ondansetron (ZOFRAN-ODT) 4 MG disintegrating tablet Take 1 tablet (4 mg total) by mouth every 8 (eight) hours as needed.  20 tablet  6  . predniSONE (DELTASONE) 2.5 MG tablet Take 2 tablets (5 mg total) by mouth daily.  60 tablet  11  . tiotropium (SPIRIVA) 18 MCG inhalation capsule Place 1 capsule (18 mcg total) into inhaler and inhale daily.  30 capsule  11  . doxycycline (VIBRA-TABS) 100 MG tablet Take 1 tablet (100 mg total) by mouth 2 (two) times daily.  14 tablet  0    Review of Systems  Constitutional: Negative for fever, chills, appetite change, fatigue and unexpected weight change.  HENT: Negative for ear pain, congestion, sore throat, trouble swallowing, neck pain, voice change and sinus pressure.   Eyes: Negative for visual disturbance.  Respiratory: Negative for cough, shortness of breath, wheezing and stridor.   Cardiovascular: Negative for chest pain, palpitations and leg swelling.  Gastrointestinal: Negative for nausea, vomiting, abdominal pain, diarrhea, constipation, blood in stool, abdominal distention and anal bleeding.  Genitourinary: Negative for dysuria and flank pain.  Musculoskeletal:  Positive for myalgias, joint swelling and arthralgias. Negative for gait problem.  Skin: Positive for color change, rash and wound.  Neurological: Negative for dizziness and headaches.  Hematological: Negative for adenopathy. Does not bruise/bleed easily.  Psychiatric/Behavioral: Negative for suicidal ideas, sleep disturbance and dysphoric mood. The patient is not nervous/anxious.        Objective:   Physical Exam  Constitutional: She is oriented to person, place, and time. She appears well-developed and  well-nourished. No distress.       Note stigmata of Down Syndrome including low set ears, large tongue, wide spacing of toes.  HENT:  Head: Normocephalic and atraumatic.  Right Ear: External ear normal.  Left Ear: External ear normal.  Nose: Nose normal.  Mouth/Throat: Oropharynx is clear and moist. No oropharyngeal exudate.  Eyes: Conjunctivae normal are normal. Pupils are equal, round, and reactive to light. Right eye exhibits no discharge. Left eye exhibits no discharge. No scleral icterus.  Neck: Normal range of motion. Neck supple. No tracheal deviation present. No thyromegaly present.  Cardiovascular: Normal rate, regular rhythm, normal heart sounds and intact distal pulses.  Exam reveals no gallop and no friction rub.   No murmur heard. Pulmonary/Chest: Effort normal and breath sounds normal. No respiratory distress. She has no wheezes. She has no rales. She exhibits no tenderness.  Abdominal: Soft. Bowel sounds are normal. She exhibits no distension and no mass. There is no tenderness. There is no rebound and no guarding.  Genitourinary:    No breast swelling, tenderness, discharge or bleeding. No labial fusion. There is no rash, tenderness, lesion or injury on the right labia. There is no rash, tenderness, lesion or injury on the left labia.  Musculoskeletal: She exhibits no edema and no tenderness.       Right hand: She exhibits decreased range of motion, tenderness, bony tenderness and deformity.       Left hand: She exhibits decreased range of motion, tenderness, bony tenderness and swelling.  Lymphadenopathy:    She has no cervical adenopathy.  Neurological: She is alert and oriented to person, place, and time. No cranial nerve deficit. She exhibits normal muscle tone. Coordination normal.  Skin: Skin is warm and dry. No rash noted. She is not diaphoretic. No erythema. No pallor.  Psychiatric: She has a normal mood and affect. Her behavior is normal. Judgment and thought content  normal.          Assessment & Plan:

## 2012-04-20 NOTE — Assessment & Plan Note (Signed)
Few recurrent small boils groin area. Wound culture at last visit was negative. Will apply warm cloths during bathing to help with drainage. Will continue topical gentamicin prn.  Will start doxycycline 168m po bid x 7 days. Pt caregiver will call if no improvement.

## 2012-04-20 NOTE — Telephone Encounter (Signed)
We should then set her up at [facility].

## 2012-04-20 NOTE — Assessment & Plan Note (Signed)
Congratulated pt on weight loss. Encouraged her to continue efforts at health diet and limited sugared beverages. Follow up 6 months and prn.

## 2012-04-20 NOTE — Telephone Encounter (Signed)
Cindy Oconnor call back the dr in [name] does not take medicaid  Please advise of another rhuemotoglogy dr. [name] medicare starts 2/14

## 2012-04-20 NOTE — Assessment & Plan Note (Signed)
Gen med exam normal today except as noted.  Will check labs including CBC, CMP, TSH, lipids.  Health maintenance is UTD.  Congratulated pt on weight loss. Encouraged continued efforts at healthy diet and limited intake of sugared beverages.Follow up 6 months and prn.

## 2012-04-20 NOTE — Assessment & Plan Note (Signed)
Previously followed by Dr. Jefm Bryant.  Pt family would like another opinion about long term management. Will set up referral to Dr. Duard Brady.

## 2012-06-13 ENCOUNTER — Other Ambulatory Visit: Payer: Self-pay | Admitting: Internal Medicine

## 2012-06-13 MED ORDER — TIOTROPIUM BROMIDE MONOHYDRATE 18 MCG IN CAPS: 18.0000 ug | ORAL_CAPSULE | Freq: Every day | RESPIRATORY_TRACT | Status: DC

## 2012-06-13 NOTE — Telephone Encounter (Signed)
Spiriva 18 mcg CP   # 30

## 2012-06-27 ENCOUNTER — Other Ambulatory Visit: Payer: Self-pay | Admitting: *Deleted

## 2012-06-28 ENCOUNTER — Other Ambulatory Visit: Payer: Self-pay | Admitting: *Deleted

## 2012-06-28 MED ORDER — OMEPRAZOLE 40 MG PO CPDR: 40.0000 mg | DELAYED_RELEASE_CAPSULE | Freq: Every day | ORAL | Status: DC

## 2012-06-28 MED ORDER — FOLIC ACID 1 MG PO TABS: 1.0000 mg | ORAL_TABLET | Freq: Every day | ORAL | Status: DC

## 2012-06-28 MED ORDER — LEVOTHYROXINE SODIUM 100 MCG PO TABS: 100.0000 ug | ORAL_TABLET | Freq: Every day | ORAL | Status: DC

## 2012-06-28 NOTE — Telephone Encounter (Signed)
Refill request  Apriso ER 0.375 gram caps  #30  Take 1 capsule by mouth each morning (Do not crush)

## 2012-06-28 NOTE — Telephone Encounter (Signed)
meds filled

## 2012-06-28 NOTE — Telephone Encounter (Signed)
Received faxed refill request from pharmacy. Is it okay to refill medication?

## 2012-06-28 NOTE — Telephone Encounter (Signed)
Refill Request  Therems tablets   # 30  Take 1 tablet by mouth once daily for supplement

## 2012-06-29 NOTE — Telephone Encounter (Signed)
We can refill this x1 only. She needs to follow up with Dr. Jefm Bryant for long-term management of her rheumatoid arthritis.

## 2012-06-30 ENCOUNTER — Telehealth: Payer: Self-pay | Admitting: Internal Medicine

## 2012-06-30 DIAGNOSIS — M069 Rheumatoid arthritis, unspecified: Secondary | ICD-10-CM

## 2012-06-30 MED ORDER — METHOTREXATE 2.5 MG PO TABS: 10.0000 mg | ORAL_TABLET | ORAL | Status: DC

## 2012-06-30 NOTE — Telephone Encounter (Signed)
Pharmacy notified.

## 2012-06-30 NOTE — Telephone Encounter (Signed)
[name] (manages group home where pt resides) is calling because they need a referral to [facility] to Dr. Cristi Loron.  Pt had been referred to [facility] to a female Dr. (does not remember what doctor) per Dr. Gilford Rile at her last visit, but that doctor would not accept medicaid so the pt was unable to be seen.  Pt is needing an appt within the next week.  Pt taking last dose of methotrexate today (prescribed once per week on Fridays) and is asking for at least one more week of methotrexate in case she is unable to get in to see Dr. Cristi Loron at [facility] by next Friday ([facility] for Rx).  Please advise [name].

## 2012-06-30 NOTE — Telephone Encounter (Signed)
Medicine called to pharmacy with instructions to follow up with Dr. Jefm Bryant.

## 2012-06-30 NOTE — Telephone Encounter (Signed)
We can send in 1 month refill on Methotrexate. I will place referral to Dr. Jefm Bryant.

## 2012-07-03 ENCOUNTER — Telehealth: Payer: Self-pay | Admitting: Emergency Medicine

## 2012-07-03 MED ORDER — METHOTREXATE 2.5 MG PO TABS: 10.0000 mg | ORAL_TABLET | ORAL | Status: DC

## 2012-07-03 NOTE — Telephone Encounter (Signed)
Caregiver Artist) notified by telephone that script has been sent in.

## 2012-07-03 NOTE — Telephone Encounter (Signed)
Spoke with caregiver to give her the info for her RA apt with Dr. Jefm Bryant. It is 3/11 @ 915, they stated they will need another refill on her methotrexate (RHEUMATREX) 2.5 MG tablet. Can we take care of this please?    -AB

## 2012-07-03 NOTE — Telephone Encounter (Signed)
I have sent in refill.

## 2012-07-04 ENCOUNTER — Other Ambulatory Visit: Payer: Self-pay | Admitting: *Deleted

## 2012-07-04 MED ORDER — BUDESONIDE-FORMOTEROL FUMARATE 160-4.5 MCG/ACT IN AERO: 2.0000 | INHALATION_SPRAY | Freq: Two times a day (BID) | RESPIRATORY_TRACT | Status: DC

## 2012-07-17 ENCOUNTER — Other Ambulatory Visit: Payer: Self-pay | Admitting: Internal Medicine

## 2012-07-18 ENCOUNTER — Telehealth: Payer: Self-pay | Admitting: *Deleted

## 2012-07-18 NOTE — Telephone Encounter (Signed)
Refill Request  Apriso ER 0.375 gram caps  #30  Take one capsule by mouth each morning (Do not crush)

## 2012-07-19 MED ORDER — METHOTREXATE 2.5 MG PO TABS: 2.5000 mg | ORAL_TABLET | ORAL | Status: DC

## 2012-07-19 NOTE — Telephone Encounter (Signed)
We can add to chart and refill x 1 month.

## 2012-07-19 NOTE — Telephone Encounter (Signed)
methotrexate (RHEUMATREX) 2.5 MG tablet   Patient is needing 2 weeks more of this medication until she can see Dr. Precious Reel.

## 2012-07-19 NOTE — Telephone Encounter (Signed)
Dr. Gilford Rile I am not seeing this medication on the patient med list, could you please help me clarify which med this may be?

## 2012-07-19 NOTE — Telephone Encounter (Signed)
Rx faxed to pharmacy on file/

## 2012-07-26 ENCOUNTER — Telehealth: Payer: Self-pay | Admitting: *Deleted

## 2012-07-26 NOTE — Telephone Encounter (Signed)
Refill Request  Apriso er 0.375 gram caps  #30  Take 1 capsule by mouth each morning (Do not crush)

## 2012-07-27 MED ORDER — MESALAMINE ER 0.375 G PO CP24: 375.0000 mg | ORAL_CAPSULE | Freq: Every day | ORAL | Status: DC

## 2012-07-27 NOTE — Telephone Encounter (Signed)
Refill sent to pharmacy on file

## 2012-07-27 NOTE — Telephone Encounter (Signed)
Fine to fill.

## 2012-08-01 ENCOUNTER — Other Ambulatory Visit: Payer: Self-pay | Admitting: *Deleted

## 2012-08-07 ENCOUNTER — Other Ambulatory Visit: Payer: Self-pay | Admitting: Internal Medicine

## 2012-08-07 DIAGNOSIS — M79609 Pain in unspecified limb: Secondary | ICD-10-CM | POA: Diagnosis not present

## 2012-08-07 DIAGNOSIS — B351 Tinea unguium: Secondary | ICD-10-CM | POA: Diagnosis not present

## 2012-08-07 NOTE — Telephone Encounter (Signed)
Received request from pharmacy again, called pharmacy and they never received the request on 2/27, verbal request given on the phone for refill.

## 2012-08-08 DIAGNOSIS — M25549 Pain in joints of unspecified hand: Secondary | ICD-10-CM | POA: Diagnosis not present

## 2012-08-08 DIAGNOSIS — M069 Rheumatoid arthritis, unspecified: Secondary | ICD-10-CM | POA: Diagnosis not present

## 2012-08-10 ENCOUNTER — Telehealth: Payer: Self-pay | Admitting: Internal Medicine

## 2012-08-10 NOTE — Telephone Encounter (Signed)
Handicap form in box

## 2012-08-11 NOTE — Telephone Encounter (Signed)
Pt mother, [name], called to confirm we have paperwork to be picked up for Cindy Oconnor's handicap sticker.  Envelope is up front and she will be in later this morning to pick up.

## 2012-08-14 NOTE — Telephone Encounter (Signed)
Patient has picked it up

## 2012-08-15 ENCOUNTER — Telehealth: Payer: Self-pay | Admitting: *Deleted

## 2012-08-15 NOTE — Telephone Encounter (Signed)
Refill Request  Therems Tablet  #5  Take 1 tablet by mouth daily for supplement

## 2012-08-16 ENCOUNTER — Other Ambulatory Visit: Payer: Self-pay | Admitting: *Deleted

## 2012-08-16 MED ORDER — THEREMS PO TABS: ORAL_TABLET | ORAL | Status: DC

## 2012-08-16 NOTE — Telephone Encounter (Signed)
This medication is not on her current list of meds, ok to send it in?

## 2012-08-16 NOTE — Telephone Encounter (Signed)
FAXED

## 2012-08-16 NOTE — Telephone Encounter (Signed)
RX FAXED

## 2012-08-16 NOTE — Telephone Encounter (Signed)
Spoke with the pharmacist, she states that this is a multivitamin and if it can not be refilled then they need an order to dc it.

## 2012-08-16 NOTE — Telephone Encounter (Signed)
No. I am not familiar with this supplement and do not generally fill supplements.

## 2012-08-16 NOTE — Telephone Encounter (Signed)
If it is a general multivitamin, fine to refill #90 with 4 refills.

## 2012-08-25 ENCOUNTER — Other Ambulatory Visit: Payer: Self-pay | Admitting: Internal Medicine

## 2012-09-18 ENCOUNTER — Encounter: Payer: Self-pay | Admitting: *Deleted

## 2012-09-18 ENCOUNTER — Other Ambulatory Visit: Payer: Self-pay | Admitting: *Deleted

## 2012-09-18 MED ORDER — LACTULOSE 10 GM/15ML PO SOLN: 30.0000 g | ORAL | Status: DC | PRN

## 2012-10-11 DIAGNOSIS — M069 Rheumatoid arthritis, unspecified: Secondary | ICD-10-CM | POA: Diagnosis not present

## 2012-10-18 ENCOUNTER — Ambulatory Visit: Payer: Medicaid Other | Admitting: Internal Medicine

## 2012-11-06 DIAGNOSIS — B351 Tinea unguium: Secondary | ICD-10-CM | POA: Diagnosis not present

## 2012-11-06 DIAGNOSIS — M79609 Pain in unspecified limb: Secondary | ICD-10-CM | POA: Diagnosis not present

## 2012-11-07 ENCOUNTER — Other Ambulatory Visit: Payer: Self-pay | Admitting: *Deleted

## 2012-11-07 DIAGNOSIS — M069 Rheumatoid arthritis, unspecified: Secondary | ICD-10-CM | POA: Diagnosis not present

## 2012-11-07 DIAGNOSIS — Z79899 Other long term (current) drug therapy: Secondary | ICD-10-CM | POA: Diagnosis not present

## 2012-11-07 MED ORDER — BENZONATATE 100 MG PO CAPS: 100.0000 mg | ORAL_CAPSULE | Freq: Three times a day (TID) | ORAL | Status: DC | PRN

## 2012-11-16 ENCOUNTER — Other Ambulatory Visit: Payer: Self-pay | Admitting: *Deleted

## 2012-11-17 MED ORDER — MESALAMINE ER 0.375 G PO CP24: ORAL_CAPSULE | ORAL | Status: DC

## 2012-11-17 NOTE — Telephone Encounter (Signed)
Patient no showed last appointment and has not been since 03/2012

## 2012-11-17 NOTE — Telephone Encounter (Signed)
Pt must have follow up visit to fill Rx.

## 2012-12-03 ENCOUNTER — Emergency Department: Payer: Self-pay | Admitting: Emergency Medicine

## 2012-12-03 DIAGNOSIS — S93699A Other sprain of unspecified foot, initial encounter: Secondary | ICD-10-CM | POA: Diagnosis not present

## 2012-12-03 DIAGNOSIS — Q909 Down syndrome, unspecified: Secondary | ICD-10-CM | POA: Diagnosis not present

## 2012-12-03 DIAGNOSIS — M069 Rheumatoid arthritis, unspecified: Secondary | ICD-10-CM | POA: Diagnosis not present

## 2012-12-03 DIAGNOSIS — Z79899 Other long term (current) drug therapy: Secondary | ICD-10-CM | POA: Diagnosis not present

## 2012-12-03 DIAGNOSIS — IMO0002 Reserved for concepts with insufficient information to code with codable children: Secondary | ICD-10-CM | POA: Diagnosis not present

## 2012-12-03 DIAGNOSIS — Z86718 Personal history of other venous thrombosis and embolism: Secondary | ICD-10-CM | POA: Diagnosis not present

## 2012-12-03 DIAGNOSIS — S93609A Unspecified sprain of unspecified foot, initial encounter: Secondary | ICD-10-CM | POA: Diagnosis not present

## 2012-12-03 DIAGNOSIS — M329 Systemic lupus erythematosus, unspecified: Secondary | ICD-10-CM | POA: Diagnosis not present

## 2012-12-04 DIAGNOSIS — S92919A Unspecified fracture of unspecified toe(s), initial encounter for closed fracture: Secondary | ICD-10-CM | POA: Diagnosis not present

## 2012-12-21 ENCOUNTER — Other Ambulatory Visit: Payer: Self-pay | Admitting: Internal Medicine

## 2013-01-01 DIAGNOSIS — S92919A Unspecified fracture of unspecified toe(s), initial encounter for closed fracture: Secondary | ICD-10-CM | POA: Diagnosis not present

## 2013-01-13 ENCOUNTER — Emergency Department: Payer: Self-pay | Admitting: Emergency Medicine

## 2013-01-13 DIAGNOSIS — R52 Pain, unspecified: Secondary | ICD-10-CM | POA: Diagnosis not present

## 2013-01-13 DIAGNOSIS — R109 Unspecified abdominal pain: Secondary | ICD-10-CM | POA: Diagnosis not present

## 2013-01-13 DIAGNOSIS — N390 Urinary tract infection, site not specified: Secondary | ICD-10-CM | POA: Diagnosis not present

## 2013-01-13 DIAGNOSIS — Z888 Allergy status to other drugs, medicaments and biological substances status: Secondary | ICD-10-CM | POA: Diagnosis not present

## 2013-01-13 LAB — CBC
HGB: 14.8 g/dL (ref 12.0–16.0)
MCH: 36.1 pg — ABNORMAL HIGH (ref 26.0–34.0)
MCHC: 34.3 g/dL (ref 32.0–36.0)
Platelet: 323 10*3/uL (ref 150–440)
RBC: 4.09 10*6/uL (ref 3.80–5.20)
WBC: 11.7 10*3/uL — ABNORMAL HIGH (ref 3.6–11.0)

## 2013-01-13 LAB — URINALYSIS, COMPLETE
Blood: NEGATIVE
Glucose,UR: NEGATIVE mg/dL (ref 0–75)
Ketone: NEGATIVE
Nitrite: NEGATIVE
Ph: 7 (ref 4.5–8.0)
Protein: NEGATIVE
Specific Gravity: 1.014 (ref 1.003–1.030)
Squamous Epithelial: <1
WBC UR: 14 /HPF (ref 0–5)

## 2013-01-13 LAB — COMPREHENSIVE METABOLIC PANEL
Albumin: 3.5 g/dL (ref 3.4–5.0)
Alkaline Phosphatase: 97 U/L (ref 50–136)
Anion Gap: 7 (ref 7–16)
BUN: 12 mg/dL (ref 7–18)
Calcium, Total: 9.5 mg/dL (ref 8.5–10.1)
Chloride: 106 mmol/L (ref 98–107)
Creatinine: 0.82 mg/dL (ref 0.60–1.30)
EGFR (African American): >60
EGFR (Non-African Amer.): >60
Osmolality: 284 (ref 275–301)
Potassium: 3.7 mmol/L (ref 3.5–5.1)
SGOT(AST): 31 U/L (ref 15–37)
SGPT (ALT): 23 U/L (ref 12–78)
Sodium: 141 mmol/L (ref 136–145)
Total Protein: 7.2 g/dL (ref 6.4–8.2)

## 2013-01-15 ENCOUNTER — Other Ambulatory Visit: Payer: Self-pay | Admitting: Internal Medicine

## 2013-01-16 ENCOUNTER — Other Ambulatory Visit: Payer: Self-pay | Admitting: Internal Medicine

## 2013-01-17 ENCOUNTER — Telehealth: Payer: Self-pay | Admitting: Internal Medicine

## 2013-01-17 NOTE — Telephone Encounter (Signed)
Pt is needing refill Apriso, Multivitamin, Prednisone, Folic Acid, Omeprazole, Synthroid, Spiriva, and Symbicort inhaler. Pt uses [facility]

## 2013-01-18 ENCOUNTER — Telehealth: Payer: Self-pay | Admitting: *Deleted

## 2013-01-19 DIAGNOSIS — K5900 Constipation, unspecified: Secondary | ICD-10-CM | POA: Diagnosis not present

## 2013-01-19 DIAGNOSIS — R1084 Generalized abdominal pain: Secondary | ICD-10-CM | POA: Diagnosis not present

## 2013-01-19 DIAGNOSIS — N390 Urinary tract infection, site not specified: Secondary | ICD-10-CM | POA: Diagnosis not present

## 2013-01-19 DIAGNOSIS — H6120 Impacted cerumen, unspecified ear: Secondary | ICD-10-CM | POA: Diagnosis not present

## 2013-01-19 DIAGNOSIS — K5190 Ulcerative colitis, unspecified, without complications: Secondary | ICD-10-CM | POA: Diagnosis not present

## 2013-01-19 MED ORDER — PREDNISONE 2.5 MG PO TABS: 5.0000 mg | ORAL_TABLET | Freq: Every day | ORAL | Status: DC

## 2013-01-19 MED ORDER — BUDESONIDE-FORMOTEROL FUMARATE 160-4.5 MCG/ACT IN AERO: 2.0000 | INHALATION_SPRAY | Freq: Two times a day (BID) | RESPIRATORY_TRACT | Status: DC

## 2013-01-19 MED ORDER — MESALAMINE ER 0.375 G PO CP24: ORAL_CAPSULE | ORAL | Status: DC

## 2013-01-19 MED ORDER — THEREMS PO TABS: ORAL_TABLET | ORAL | Status: DC

## 2013-01-19 MED ORDER — OMEPRAZOLE 40 MG PO CPDR: DELAYED_RELEASE_CAPSULE | ORAL | Status: DC

## 2013-01-19 MED ORDER — FOLIC ACID 1 MG PO TABS: 1.0000 mg | ORAL_TABLET | Freq: Every day | ORAL | Status: DC

## 2013-01-19 MED ORDER — LEVOTHYROXINE SODIUM 100 MCG PO TABS: ORAL_TABLET | ORAL | Status: DC

## 2013-01-19 NOTE — Addendum Note (Signed)
Addended by: Ronaldo Miyamoto on: 01/19/2013 02:53 PM   Modules accepted: Orders

## 2013-01-19 NOTE — Telephone Encounter (Signed)
PRESCRIPTIONS SENT TO THE PHARMACY

## 2013-01-19 NOTE — Telephone Encounter (Signed)
PLEASE REFER TO PREVIOUS ENCOUNTER FOR MORE INFORMATION

## 2013-01-19 NOTE — Telephone Encounter (Signed)
The patient does not have enough medication to get her through the weekend . Could you call in a 30 day supply of  her prescriptions in order to get her through until she is seen by the physician on Wednesday Aug. 27.

## 2013-01-22 ENCOUNTER — Telehealth: Payer: Self-pay | Admitting: Internal Medicine

## 2013-01-22 MED ORDER — TIOTROPIUM BROMIDE MONOHYDRATE 18 MCG IN CAPS: 18.0000 ug | ORAL_CAPSULE | Freq: Every day | RESPIRATORY_TRACT | Status: DC

## 2013-01-22 NOTE — Telephone Encounter (Signed)
The patient is completely out . This was missed on Friday when her medications were called into [facility]   tiotropium (SPIRIVA) 18 MCG inhalation capsule

## 2013-01-24 ENCOUNTER — Telehealth: Payer: Self-pay | Admitting: *Deleted

## 2013-01-24 ENCOUNTER — Ambulatory Visit (INDEPENDENT_AMBULATORY_CARE_PROVIDER_SITE_OTHER): Payer: Medicaid Other | Admitting: Internal Medicine

## 2013-01-24 ENCOUNTER — Encounter: Payer: Self-pay | Admitting: Internal Medicine

## 2013-01-24 VITALS — BP 102/78 | HR 65 | Temp 98.4°F | Wt 165.0 lb

## 2013-01-24 DIAGNOSIS — E669 Obesity, unspecified: Secondary | ICD-10-CM

## 2013-01-24 DIAGNOSIS — R9431 Abnormal electrocardiogram [ECG] [EKG]: Secondary | ICD-10-CM

## 2013-01-24 DIAGNOSIS — S92919A Unspecified fracture of unspecified toe(s), initial encounter for closed fracture: Secondary | ICD-10-CM | POA: Diagnosis not present

## 2013-01-24 DIAGNOSIS — M069 Rheumatoid arthritis, unspecified: Secondary | ICD-10-CM

## 2013-01-24 DIAGNOSIS — Z1239 Encounter for other screening for malignant neoplasm of breast: Secondary | ICD-10-CM

## 2013-01-24 DIAGNOSIS — Z0000 Encounter for general adult medical examination without abnormal findings: Secondary | ICD-10-CM | POA: Insufficient documentation

## 2013-01-24 NOTE — Assessment & Plan Note (Signed)
General medical exam today is normal. Will request records on previous mammogram. Pap and pelvic deferred as patient status post complete hysterectomy. Immunizations are up-to-date. Will request recent lab report from her rheumatologist.

## 2013-01-24 NOTE — Assessment & Plan Note (Signed)
Wt Readings from Last 3 Encounters:  01/24/13 165 lb (74.844 kg)  04/20/12 174 lb 4 oz (79.039 kg)  01/21/12 185 lb (83.915 kg)   Congratulated patient on weight loss. Encouraged her to continue to follow a healthy diet and get regular physical activity.

## 2013-01-24 NOTE — Assessment & Plan Note (Signed)
Will request recent notes from her rheumatologist including lab report.

## 2013-01-24 NOTE — Progress Notes (Signed)
Subjective:    Patient ID: Cindy Oconnor, female    DOB: 1970-12-19, 42 y.o.   MRN: [id number]  HPI 42 year old female with history of Down syndrome, hypothyroidism, rheumatoid arthritis, ulcerative colitis, asthma presents for annual exam. Her caregiver reports that she is generally doing well. She was seen by a rheumatologist and her dose of methotrexate has been increased to better control symptoms from rheumatoid arthritis. She has not recently complained of any joint pain. She has not recently had any fever or chills. She has been following a healthier diet and has lost about 5 pounds since her last visit. She has also been physically active. No new concerns today.Caregiver is unsure of date of last mammogram. Pt is s/p hysterectomy.  Outpatient Encounter Prescriptions as of 01/24/2013  Medication Sig Dispense Refill  . benzonatate (TESSALON) 100 MG capsule Take 1 capsule (100 mg total) by mouth 3 (three) times daily as needed.  20 capsule  3  . budesonide-formoterol (SYMBICORT) 160-4.5 MCG/ACT inhaler Inhale 2 puffs into the lungs 2 (two) times daily.  1 Inhaler  0  . folic acid (FOLVITE) 1 MG tablet Take 1 tablet (1 mg total) by mouth daily.  30 tablet  0  . gentamicin ointment (GARAMYCIN) 0.1 % Apply 1 application topically 3 (three) times daily as needed.      Marland Kitchen guaifenesin (HUMIBID E) 400 MG TABS Take 1 tablet (400 mg total) by mouth every 4 (four) hours.  56 tablet  11  . lactulose (CHRONULAC) 10 GM/15ML solution Take 45 mLs (30 g total) by mouth as needed. For constipation  500 mL  0  . levalbuterol (XOPENEX) 0.63 MG/3ML nebulizer solution Take 1 ampule by nebulization 3 (three) times daily as needed.      Marland Kitchen levothyroxine (SYNTHROID, LEVOTHROID) 100 MCG tablet TAKE 1 TABLET BY MOUTH DAILY. (THYROID)  30 tablet  0  . mesalamine (APRISO) 0.375 G 24 hr capsule TAKE (1) CAPSULE BY MOUTH EACH MORNING. (DO NOT CRUSH)  30 capsule  1  . methotrexate (RHEUMATREX) 2.5 MG tablet Take 2.5 mg by  mouth once a week. Take 6 tablets once a week. Caution:Chemotherapy. Protect from light.      . metroNIDAZOLE (FLAGYL) 500 MG tablet Take 500 mg by mouth 2 (two) times daily.      . Multiple Vitamin (THEREMS) TABS TAKE 1 TABLET BY MOUTH ONCE DAILY FOR SUPPLEMENT.  30 tablet  11  . omeprazole (PRILOSEC) 40 MG capsule TAKE 1 CAPSULE BY MOUTH ONCE DAILY FOR STOMACH. *DO NOT CRUSH*  30 capsule  0  . ondansetron (ZOFRAN-ODT) 4 MG disintegrating tablet Take 1 tablet (4 mg total) by mouth every 8 (eight) hours as needed.  20 tablet  6  . predniSONE (DELTASONE) 2.5 MG tablet Take 2 tablets (5 mg total) by mouth daily.  60 tablet  0  . tiotropium (SPIRIVA) 18 MCG inhalation capsule Place 1 capsule (18 mcg total) into inhaler and inhale daily.  30 capsule  0  . [DISCONTINUED] methotrexate (RHEUMATREX) 2.5 MG tablet Take 1 tablet (2.5 mg total) by mouth once a week. Caution:Chemotherapy. Protect from light.  16 tablet  0  . diphenhydrAMINE (BENADRYL) 25 MG tablet Take 1 tablet (25 mg total) by mouth every 6 (six) hours as needed.  30 tablet  11  . doxycycline (VIBRA-TABS) 100 MG tablet Take 1 tablet (100 mg total) by mouth 2 (two) times daily.  14 tablet  0  . mesalamine (APRISO) 0.375 G 24 hr capsule TAKE (1) CAPSULE  BY MOUTH EACH MORNING. (DO NOT CRUSH)  30 capsule  0  . Multiple Vitamin (MULTIVITAMIN) tablet Take 1 tablet by mouth daily.      . Multiple Vitamin (THEREMS) TABS Take 1 tablet by mouth daily for supplement  90 tablet  4   No facility-administered encounter medications on file as of 01/24/2013.   BP 102/78  Pulse 65  Temp(Src) 98.4 F (36.9 C) (Oral)  Wt 165 lb (74.844 kg)  BMI 35.7 kg/m2  SpO2 95%  LMP 04/11/1981  Review of Systems  Constitutional: Negative for fever, chills, appetite change, fatigue and unexpected weight change.  HENT: Negative for ear pain, congestion, sore throat, trouble swallowing, neck pain, voice change and sinus pressure.   Eyes: Negative for visual  disturbance.  Respiratory: Negative for cough, shortness of breath, wheezing and stridor.   Cardiovascular: Negative for chest pain, palpitations and leg swelling.  Gastrointestinal: Negative for nausea, vomiting, abdominal pain, diarrhea, constipation, blood in stool, abdominal distention and anal bleeding.  Genitourinary: Negative for dysuria and flank pain.  Musculoskeletal: Negative for myalgias, arthralgias and gait problem.  Skin: Negative for color change and rash.  Neurological: Negative for dizziness and headaches.  Hematological: Negative for adenopathy. Does not bruise/bleed easily.  Psychiatric/Behavioral: Negative for suicidal ideas, sleep disturbance and dysphoric mood. The patient is not nervous/anxious.        Objective:   Physical Exam  Constitutional: She is oriented to person, place, and time. She appears well-developed and well-nourished. No distress.  HENT:  Head: Normocephalic and atraumatic.  Right Ear: External ear normal.  Left Ear: External ear normal.  Nose: Nose normal.  Mouth/Throat: Oropharynx is clear and moist. No oropharyngeal exudate.  Eyes: Conjunctivae are normal. Pupils are equal, round, and reactive to light. Right eye exhibits no discharge. Left eye exhibits no discharge. No scleral icterus.  Neck: Normal range of motion. Neck supple. No tracheal deviation present. No thyromegaly present.  Cardiovascular: Normal rate, regular rhythm, normal heart sounds and intact distal pulses.  Exam reveals no gallop and no friction rub.   No murmur heard. Pulmonary/Chest: Effort normal and breath sounds normal. No accessory muscle usage. Not tachypneic. No respiratory distress. She has no decreased breath sounds. She has no wheezes. She has no rhonchi. She has no rales. She exhibits no tenderness.  Musculoskeletal: Normal range of motion. She exhibits no edema and no tenderness.  Lymphadenopathy:    She has no cervical adenopathy.  Neurological: She is alert and  oriented to person, place, and time. No cranial nerve deficit. She exhibits normal muscle tone. Coordination normal.  Skin: Skin is warm and dry. No rash noted. She is not diaphoretic. No erythema. No pallor.  Psychiatric: She has a normal mood and affect. Her behavior is normal. Judgment and thought content normal.          Assessment & Plan:

## 2013-01-24 NOTE — Telephone Encounter (Signed)
Order placed. Amber can you help schedule. This pt has Down Syndrome.

## 2013-01-24 NOTE — Telephone Encounter (Signed)
Cindy Oconnor, her caregiver left a message stating patient has never had a mammogram and would like for her to be referred to [facility] to have one. Also she will contact her family about a cardiologist, but as long as she has been there she has not. She will call us back and let us know.

## 2013-01-24 NOTE — Telephone Encounter (Signed)
Becky left another message stating patient has never been by a cardiologist, but her mother sees a physician at [facility]. She would like her to be referred to [facility] and their phone number [phone], really would prefer morning appointments.

## 2013-01-24 NOTE — Assessment & Plan Note (Signed)
EKG abnormal in pt with Down Syndrome. I reviewed records that we have available and I cannot find prior history of ECHO. Pt with Down Syndrome are at higher risk of both congential cardiac malformations and CAD. Will set up cardiology evaluation for possible ECHO.

## 2013-01-25 ENCOUNTER — Telehealth: Payer: Self-pay | Admitting: Internal Medicine

## 2013-01-25 NOTE — Telephone Encounter (Signed)
Patient to see Dr. Leonides Schanz on 9/26 @ 1220, [place]. Patient has a mammo scheduled on 9/10 @ 930

## 2013-01-25 NOTE — Telephone Encounter (Signed)
Patient needs to have lipid profile drawn, as this was not included on recent labs.

## 2013-01-26 NOTE — Telephone Encounter (Signed)
Left message on Cindy Oconnor, her caregiver's cell phone

## 2013-01-30 NOTE — Telephone Encounter (Signed)
Spoke with [place], she will call back to schedule lab appointment. She was driving and aware it is a fasting lab

## 2013-02-02 ENCOUNTER — Other Ambulatory Visit: Payer: Self-pay | Admitting: *Deleted

## 2013-02-02 ENCOUNTER — Telehealth: Payer: Self-pay | Admitting: *Deleted

## 2013-02-02 DIAGNOSIS — Z0000 Encounter for general adult medical examination without abnormal findings: Secondary | ICD-10-CM

## 2013-02-02 DIAGNOSIS — E039 Hypothyroidism, unspecified: Secondary | ICD-10-CM

## 2013-02-02 NOTE — Telephone Encounter (Signed)
Pt is coming in for labs on Monday 09.08.2014 what labs and dx?

## 2013-02-02 NOTE — Telephone Encounter (Signed)
CMP, CBC, lipids, TSH v70.0 and 244.9

## 2013-02-05 ENCOUNTER — Telehealth: Payer: Self-pay | Admitting: *Deleted

## 2013-02-05 ENCOUNTER — Other Ambulatory Visit (INDEPENDENT_AMBULATORY_CARE_PROVIDER_SITE_OTHER): Payer: Medicare Other

## 2013-02-05 ENCOUNTER — Other Ambulatory Visit: Payer: Self-pay | Admitting: Internal Medicine

## 2013-02-05 ENCOUNTER — Encounter: Payer: Self-pay | Admitting: *Deleted

## 2013-02-05 DIAGNOSIS — Z0000 Encounter for general adult medical examination without abnormal findings: Secondary | ICD-10-CM | POA: Diagnosis not present

## 2013-02-05 DIAGNOSIS — E039 Hypothyroidism, unspecified: Secondary | ICD-10-CM

## 2013-02-05 LAB — COMPREHENSIVE METABOLIC PANEL
Albumin: 3.6 g/dL (ref 3.5–5.2)
BUN: 15 mg/dL (ref 6–23)
Calcium: 9.1 mg/dL (ref 8.4–10.5)
Chloride: 105 mEq/L (ref 96–112)
GFR: 82.64 mL/min (ref >60.00)
Glucose, Bld: 85 mg/dL (ref 70–99)
Potassium: 3.5 mEq/L (ref 3.5–5.1)

## 2013-02-05 LAB — LIPID PANEL
Cholesterol: 187 mg/dL (ref 0–200)
Total CHOL/HDL Ratio: 4
Triglycerides: 110 mg/dL (ref 0.0–149.0)

## 2013-02-05 LAB — CBC WITH DIFFERENTIAL/PLATELET
Basophils Absolute: 0 10*3/uL (ref 0.0–0.1)
Eosinophils Absolute: 0.1 10*3/uL (ref 0.0–0.7)
Lymphocytes Relative: 14.9 % (ref 12.0–46.0)
MCHC: 33.6 g/dL (ref 30.0–36.0)
Neutro Abs: 7.2 10*3/uL (ref 1.4–7.7)
Neutrophils Relative %: 80.6 % — ABNORMAL HIGH (ref 43.0–77.0)
Platelets: 297 10*3/uL (ref 150.0–400.0)
RDW: 16.8 % — ABNORMAL HIGH (ref 11.5–14.6)

## 2013-02-05 NOTE — Telephone Encounter (Signed)
Labs faxed via epic

## 2013-02-05 NOTE — Telephone Encounter (Signed)
Would like for results to always be faxed over to [phone] Attention to [facility]

## 2013-02-07 ENCOUNTER — Ambulatory Visit: Payer: Self-pay | Admitting: Internal Medicine

## 2013-02-07 DIAGNOSIS — Z1231 Encounter for screening mammogram for malignant neoplasm of breast: Secondary | ICD-10-CM | POA: Diagnosis not present

## 2013-02-13 DIAGNOSIS — I071 Rheumatic tricuspid insufficiency: Secondary | ICD-10-CM | POA: Insufficient documentation

## 2013-02-13 DIAGNOSIS — R9431 Abnormal electrocardiogram [ECG] [EKG]: Secondary | ICD-10-CM | POA: Diagnosis not present

## 2013-02-13 DIAGNOSIS — Q909 Down syndrome, unspecified: Secondary | ICD-10-CM | POA: Diagnosis not present

## 2013-02-13 DIAGNOSIS — I079 Rheumatic tricuspid valve disease, unspecified: Secondary | ICD-10-CM | POA: Diagnosis not present

## 2013-02-15 ENCOUNTER — Other Ambulatory Visit: Payer: Self-pay | Admitting: *Deleted

## 2013-02-15 MED ORDER — TIOTROPIUM BROMIDE MONOHYDRATE 18 MCG IN CAPS: 18.0000 ug | ORAL_CAPSULE | Freq: Every day | RESPIRATORY_TRACT | Status: DC

## 2013-02-16 ENCOUNTER — Encounter: Payer: Self-pay | Admitting: Internal Medicine

## 2013-02-26 DIAGNOSIS — Q828 Other specified congenital malformations of skin: Secondary | ICD-10-CM | POA: Diagnosis not present

## 2013-02-26 DIAGNOSIS — B351 Tinea unguium: Secondary | ICD-10-CM | POA: Diagnosis not present

## 2013-02-26 DIAGNOSIS — M79609 Pain in unspecified limb: Secondary | ICD-10-CM | POA: Diagnosis not present

## 2013-03-05 ENCOUNTER — Other Ambulatory Visit: Payer: Self-pay | Admitting: Internal Medicine

## 2013-03-05 NOTE — Telephone Encounter (Signed)
Eprescribed.

## 2013-03-08 ENCOUNTER — Telehealth: Payer: Self-pay | Admitting: Internal Medicine

## 2013-03-08 DIAGNOSIS — S8000XA Contusion of unspecified knee, initial encounter: Secondary | ICD-10-CM | POA: Diagnosis not present

## 2013-03-08 NOTE — Telephone Encounter (Signed)
Cindy Oconnor states pt fell last week and hurt her knee.  States she would like a referral to a sports medicine type clinic for her to be seen, not be seen here for this.  States the pt also has arthritis.  Keep local in [place].  Gave # to [facility] and [facility], Ms. [name] will call herself to see if she can make appt w/o referral.

## 2013-03-09 NOTE — Telephone Encounter (Signed)
FYI... To Dr. Marijean Bravo spoke with patient mother [name], she stated she was seen at [facility] in [place]. No broken bones or fractures, it was just bruised. But did notice she has arthritis in her knees but she is handling that well. Sent her home with an ice pack to use

## 2013-03-10 NOTE — Telephone Encounter (Signed)
Do they still want a referral to sports med?

## 2013-03-12 NOTE — Telephone Encounter (Signed)
No, they got everything taken care of already.

## 2013-03-13 ENCOUNTER — Telehealth: Payer: Self-pay | Admitting: *Deleted

## 2013-03-13 NOTE — Telephone Encounter (Signed)
Refill Request  Mucus Relief 400 mg tablet   #60   Take one tablet by mouth every 4 hours as needed for congestion

## 2013-03-19 ENCOUNTER — Other Ambulatory Visit: Payer: Self-pay | Admitting: Internal Medicine

## 2013-03-20 ENCOUNTER — Encounter: Payer: Self-pay | Admitting: Adult Health

## 2013-03-20 ENCOUNTER — Other Ambulatory Visit: Payer: Self-pay | Admitting: Internal Medicine

## 2013-03-20 ENCOUNTER — Ambulatory Visit (INDEPENDENT_AMBULATORY_CARE_PROVIDER_SITE_OTHER): Payer: Medicare Other | Admitting: Adult Health

## 2013-03-20 VITALS — BP 100/68 | HR 64 | Temp 98.1°F | Wt 165.8 lb

## 2013-03-20 DIAGNOSIS — K120 Recurrent oral aphthae: Secondary | ICD-10-CM | POA: Diagnosis not present

## 2013-03-20 MED ORDER — PREDNISONE 5 MG PO TABS: 5.0000 mg | ORAL_TABLET | Freq: Every day | ORAL | Status: DC

## 2013-03-20 MED ORDER — BENZONATATE 100 MG PO CAPS: 100.0000 mg | ORAL_CAPSULE | Freq: Three times a day (TID) | ORAL | Status: DC | PRN

## 2013-03-20 MED ORDER — CHLORHEXIDINE GLUCONATE 0.12 % MT SOLN: 15.0000 mL | Freq: Two times a day (BID) | OROMUCOSAL | Status: DC

## 2013-03-20 MED ORDER — OMEPRAZOLE 40 MG PO CPDR: DELAYED_RELEASE_CAPSULE | ORAL | Status: DC

## 2013-03-20 MED ORDER — ONDANSETRON 4 MG PO TBDP: 4.0000 mg | ORAL_TABLET | Freq: Three times a day (TID) | ORAL | Status: DC | PRN

## 2013-03-20 MED ORDER — GUAIFENESIN 400 MG PO TABS: 400.0000 mg | ORAL_TABLET | ORAL | Status: DC

## 2013-03-20 NOTE — Assessment & Plan Note (Signed)
Peridex mouthwash 15 mL's twice a day. Orajel topically to affected areas before meals and at bedtime as needed for pain.

## 2013-03-20 NOTE — Patient Instructions (Signed)
  Start Peridex mouth wash twice daily until complete. Brush teeth and then do the mouthwash.  Use Orajel topically to affected areas before meals and at bedtime as needed for pain.  Canker Sores  Canker sores are painful, open sores on the inside of the mouth and cheek. They may be white or yellow. The sores usually heal in 1 to 2 weeks. Women are more likely than men to have recurrent canker sores. CAUSES The cause of canker sores is not well understood. More than one cause is likely. Canker sores do not appear to be caused by certain types of germs (viruses or bacteria). Canker sores may be caused by:  An allergic reaction to certain foods.  Digestive problems.  Not having enough vitamin W23, folic acid, and iron.  Female sex hormones. Sores may come only during certain phases of a menstrual cycle. Often, there is improvement during pregnancy.  Genetics. Some people seem to inherit canker sore problems. Emotional stress and injuries to the mouth may trigger outbreaks, but not cause them.  DIAGNOSIS Canker sores are diagnosed by exam.  TREATMENT  Patients who have frequent bouts of canker sores may have cultures taken of the sores, blood tests, or allergy tests. This helps determine if their sores are caused by a poor diet, an allergy, or some other preventable or treatable disease.  Vitamins may prevent recurrences or reduce the severity of canker sores in people with poor nutrition.  Numbing ointments can relieve pain. These are available in drug stores without a prescription.  Anti-inflammatory steroid mouth rinses or gels may be prescribed by your caregiver for severe sores.  Oral steroids may be prescribed if you have severe, recurrent canker sores. These strong medicines can cause many side effects and should be used only under the close direction of a dentist or physician.  Mouth rinses containing the antibiotic medicine may be prescribed. They may lessen symptoms and speed  healing. Healing usually happens in about 1 or 2 weeks with or without treatment. Certain antibiotic mouth rinses given to pregnant women and young children can permanently stain teeth. Talk to your caregiver about your treatment. HOME CARE INSTRUCTIONS   Avoid foods that cause canker sores for you.  Avoid citrus juices, spicy or salty foods, and coffee until the sores are healed.  Use a soft-bristled toothbrush.  Chew your food carefully to avoid biting your cheek.  Apply topical numbing medicine to the sore to help relieve pain.  Apply a thin paste of baking soda and water to the sore to help heal the sore.  Only use mouth rinses or medicines for pain or discomfort as directed by your caregiver. SEEK MEDICAL CARE IF:   Your symptoms are not better in 1 week.  Your sores are still present after 2 weeks.  Your sores are very painful.  You have trouble breathing or swallowing.  Your sores come back frequently. Document Released: 09/11/2010 Document Revised: 08/09/2011 Document Reviewed: 09/11/2010 [facility] Patient Information 2014 [place].

## 2013-03-20 NOTE — Progress Notes (Signed)
Subjective:    Patient ID: Cindy Oconnor, female    DOB: Jul 03, 1970, 42 y.o.   MRN: [id number]  HPI  Patient is a pleasant 42 year old female with Down syndrome who presents to clinic with her caregiver. Patient is complaining of painful sores inside her mouth. They began couple of days ago. She denies any injury, fever or chills, or other symptoms. Caregiver is also requesting medication refills for patient.   Current Outpatient Prescriptions on File Prior to Visit  Medication Sig Dispense Refill  . APRISO 0.375 G 24 hr capsule TAKE (1) CAPSULE BY MOUTH EACH MORNING. (DO NOT CRUSH)  30 capsule  5  . diphenhydrAMINE (BENADRYL) 25 MG tablet Take 1 tablet (25 mg total) by mouth every 6 (six) hours as needed.  30 tablet  11  . folic acid (FOLVITE) 1 MG tablet Take 1 tablet (1 mg total) by mouth daily.  30 tablet  0  . gentamicin ointment (GARAMYCIN) 0.1 % Apply 1 application topically 3 (three) times daily as needed.      . lactulose (CHRONULAC) 10 GM/15ML solution Take 45 mLs (30 g total) by mouth as needed. For constipation  500 mL  0  . levalbuterol (XOPENEX) 0.63 MG/3ML nebulizer solution Take 1 ampule by nebulization 3 (three) times daily as needed.      Marland Kitchen levothyroxine (SYNTHROID, LEVOTHROID) 100 MCG tablet TAKE 1 TABLET BY MOUTH DAILY. (THYROID)  30 tablet  11  . methotrexate (RHEUMATREX) 2.5 MG tablet Take 2.5 mg by mouth once a week. Take 6 tablets once a week. Caution:Chemotherapy. Protect from light.      . Multiple Vitamin (MULTIVITAMIN) tablet Take 1 tablet by mouth daily.      . Multiple Vitamin (THEREMS) TABS TAKE 1 TABLET BY MOUTH ONCE DAILY FOR SUPPLEMENT.  30 tablet  11  . Multiple Vitamin (THEREMS) TABS Take 1 tablet by mouth daily for supplement  90 tablet  4  . SYMBICORT 160-4.5 MCG/ACT inhaler USE 2 PUFFS BY MOUTH TWICE DAILY FOR BREATHING. (1 MIN. B/T PUFFS)  10.2 g  1  . tiotropium (SPIRIVA) 18 MCG inhalation capsule Place 1 capsule (18 mcg total) into inhaler and  inhale daily.  30 capsule  5  . doxycycline (VIBRA-TABS) 100 MG tablet Take 1 tablet (100 mg total) by mouth 2 (two) times daily.  14 tablet  0  . mesalamine (APRISO) 0.375 G 24 hr capsule TAKE (1) CAPSULE BY MOUTH EACH MORNING. (DO NOT CRUSH)  30 capsule  1  . mesalamine (APRISO) 0.375 G 24 hr capsule TAKE (1) CAPSULE BY MOUTH EACH MORNING. (DO NOT CRUSH)  30 capsule  0   No current facility-administered medications on file prior to visit.     Review of Systems  Constitutional: Negative for fever, chills and fatigue.  HENT: Positive for dental problem and mouth sores.        Scheduled to have wisdom teeth pulled within the next month  Respiratory: Negative.   Cardiovascular: Negative.   Gastrointestinal: Negative.        Objective:   Physical Exam  Constitutional: She is oriented to person, place, and time.  Pleasant 42 year old female in no apparent distress.  HENT:  Aphthous ulcers inside cheeks bilaterally  Cardiovascular: Normal rate and regular rhythm.   Pulmonary/Chest: Effort normal and breath sounds normal.  Neurological: She is alert and oriented to person, place, and time.  Psychiatric: She has a normal mood and affect. Her behavior is normal.    BP 100/68  Pulse  64  Temp(Src) 98.1 F (36.7 C) (Oral)  Wt 165 lb 12.8 oz (75.206 kg)  BMI 35.87 kg/m2  SpO2 93%  LMP 04/11/1981       Assessment & Plan:

## 2013-04-10 ENCOUNTER — Other Ambulatory Visit: Payer: Self-pay | Admitting: Adult Health

## 2013-04-16 ENCOUNTER — Ambulatory Visit: Payer: Self-pay | Admitting: Gastroenterology

## 2013-04-16 DIAGNOSIS — R109 Unspecified abdominal pain: Secondary | ICD-10-CM | POA: Diagnosis not present

## 2013-04-16 DIAGNOSIS — Z79899 Other long term (current) drug therapy: Secondary | ICD-10-CM | POA: Diagnosis not present

## 2013-04-16 DIAGNOSIS — Z881 Allergy status to other antibiotic agents status: Secondary | ICD-10-CM | POA: Diagnosis not present

## 2013-04-16 DIAGNOSIS — J45909 Unspecified asthma, uncomplicated: Secondary | ICD-10-CM | POA: Diagnosis not present

## 2013-04-16 DIAGNOSIS — R1084 Generalized abdominal pain: Secondary | ICD-10-CM | POA: Diagnosis not present

## 2013-04-16 DIAGNOSIS — K5190 Ulcerative colitis, unspecified, without complications: Secondary | ICD-10-CM | POA: Diagnosis not present

## 2013-04-16 DIAGNOSIS — Z888 Allergy status to other drugs, medicaments and biological substances status: Secondary | ICD-10-CM | POA: Diagnosis not present

## 2013-04-16 DIAGNOSIS — M069 Rheumatoid arthritis, unspecified: Secondary | ICD-10-CM | POA: Diagnosis not present

## 2013-04-16 DIAGNOSIS — Q909 Down syndrome, unspecified: Secondary | ICD-10-CM | POA: Diagnosis not present

## 2013-04-16 DIAGNOSIS — Z86718 Personal history of other venous thrombosis and embolism: Secondary | ICD-10-CM | POA: Diagnosis not present

## 2013-04-16 DIAGNOSIS — Z8371 Family history of colonic polyps: Secondary | ICD-10-CM | POA: Diagnosis not present

## 2013-04-16 HISTORY — PX: COLONOSCOPY: SHX174

## 2013-04-18 LAB — PATHOLOGY REPORT

## 2013-04-30 ENCOUNTER — Encounter: Payer: Self-pay | Admitting: Internal Medicine

## 2013-04-30 ENCOUNTER — Ambulatory Visit (INDEPENDENT_AMBULATORY_CARE_PROVIDER_SITE_OTHER): Payer: Medicare Other | Admitting: Internal Medicine

## 2013-04-30 ENCOUNTER — Telehealth: Payer: Self-pay | Admitting: *Deleted

## 2013-04-30 VITALS — BP 98/70 | HR 71 | Temp 98.3°F | Ht <= 58 in | Wt 169.0 lb

## 2013-04-30 DIAGNOSIS — Z0000 Encounter for general adult medical examination without abnormal findings: Secondary | ICD-10-CM | POA: Diagnosis not present

## 2013-04-30 NOTE — Progress Notes (Signed)
Pre-visit discussion using our clinic review tool. No additional management support is needed unless otherwise documented below in the visit note.  

## 2013-04-30 NOTE — Assessment & Plan Note (Signed)
General medical exam today is normal including breast exam. Mammogram order placed. PAP and pelvic deferred as pt is s/p hysterectomy. Immunizations are UTD. Reviewed recent labs. Encouraged use of emollient cream during dry winter months to help with dermatitis. Encouraged healthy diet and regular exercise. Follow up 6 months and prn. Pt will also follow up with Dr. Rayann Heman in GI and Dr. Jefm Bryant in rheumatology.

## 2013-04-30 NOTE — Telephone Encounter (Signed)
No, they should just continue the original prn order.

## 2013-04-30 NOTE — Progress Notes (Signed)
Subjective:    Patient ID: Cindy Oconnor, female    DOB: 1970-06-12, 42 y.o.   MRN: [id number]  HPI 42 year old female with history of Down syndrome, ulcerative colitis, rheumatoid arthritis, hypothyroidism, asthma presents for annual exam. She reports that she is doing well. She is looking forward to an upcoming field trip with her group home. She recently got back from [employer]. She denies any concerns today except for occasional dry itchy skin.  Outpatient Encounter Prescriptions as of 04/30/2013  Medication Sig  . APRISO 0.375 G 24 hr capsule TAKE (1) CAPSULE BY MOUTH EACH MORNING. (DO NOT CRUSH)  . benzonatate (TESSALON) 100 MG capsule Take 1 capsule (100 mg total) by mouth 3 (three) times daily as needed.  . folic acid (FOLVITE) 1 MG tablet Take 1 tablet (1 mg total) by mouth daily.  Marland Kitchen gentamicin ointment (GARAMYCIN) 0.1 % Apply 1 application topically 3 (three) times daily as needed.  . lactulose (CHRONULAC) 10 GM/15ML solution Take 45 mLs (30 g total) by mouth as needed. For constipation  . levalbuterol (XOPENEX) 0.63 MG/3ML nebulizer solution Take 1 ampule by nebulization 3 (three) times daily as needed.  Marland Kitchen levothyroxine (SYNTHROID, LEVOTHROID) 100 MCG tablet TAKE 1 TABLET BY MOUTH DAILY. (THYROID)  . mesalamine (APRISO) 0.375 G 24 hr capsule TAKE (1) CAPSULE BY MOUTH EACH MORNING. (DO NOT CRUSH)  . methotrexate (RHEUMATREX) 2.5 MG tablet Take 2.5 mg by mouth once a week. Take 6 tablets once a week. Caution:Chemotherapy. Protect from light.  . Multiple Vitamin (THEREMS) TABS Take 1 tablet by mouth daily for supplement  . omeprazole (PRILOSEC) 40 MG capsule TAKE 1 CAPSULE BY MOUTH ONCE DAILY FOR STOMACH. *DO NOT CRUSH*  . predniSONE (DELTASONE) 5 MG tablet Take 1 tablet (5 mg total) by mouth daily.  . SYMBICORT 160-4.5 MCG/ACT inhaler USE 2 PUFFS BY MOUTH TWICE DAILY FOR BREATHING. (1 MIN. B/T PUFFS)  . tiotropium (SPIRIVA) 18 MCG inhalation capsule Place 1 capsule (18 mcg  total) into inhaler and inhale daily.   BP 98/70  Pulse 71  Temp(Src) 98.3 F (36.8 C) (Oral)  Ht 4' 7.5" (1.41 m)  Wt 169 lb (76.658 kg)  BMI 38.56 kg/m2  SpO2 95%  LMP 04/11/1981  Review of Systems  Constitutional: Negative for fever, chills, appetite change, fatigue and unexpected weight change.  HENT: Negative for congestion, ear pain, sinus pressure, sore throat, trouble swallowing and voice change.   Eyes: Negative for visual disturbance.  Respiratory: Negative for cough, shortness of breath, wheezing and stridor.   Cardiovascular: Negative for chest pain, palpitations and leg swelling.  Gastrointestinal: Negative for nausea, vomiting, abdominal pain, diarrhea, constipation, blood in stool, abdominal distention and anal bleeding.  Genitourinary: Negative for dysuria and flank pain.  Musculoskeletal: Negative for arthralgias, gait problem, myalgias and neck pain.  Skin: Negative for color change and rash.  Neurological: Negative for dizziness and headaches.  Hematological: Negative for adenopathy. Does not bruise/bleed easily.  Psychiatric/Behavioral: Negative for suicidal ideas, sleep disturbance and dysphoric mood. The patient is not nervous/anxious.        Objective:   Physical Exam  Constitutional: She is oriented to person, place, and time. She appears well-developed and well-nourished. No distress.  HENT:  Head: Normocephalic and atraumatic.  Right Ear: External ear normal.  Left Ear: External ear normal.  Nose: Nose normal.  Mouth/Throat: Oropharynx is clear and moist. No oropharyngeal exudate.  Eyes: Conjunctivae are normal. Pupils are equal, round, and reactive to light. Right eye exhibits no  discharge. Left eye exhibits no discharge. No scleral icterus.  Neck: Normal range of motion. Neck supple. No tracheal deviation present. No thyromegaly present.  Cardiovascular: Normal rate, regular rhythm, normal heart sounds and intact distal pulses.  Exam reveals no  gallop and no friction rub.   No murmur heard. Pulmonary/Chest: Effort normal and breath sounds normal. No accessory muscle usage. Not tachypneic. No respiratory distress. She has no decreased breath sounds. She has no wheezes. She has no rales. She exhibits no tenderness. Right breast exhibits no inverted nipple, no mass, no nipple discharge, no skin change and no tenderness. Left breast exhibits no inverted nipple, no mass, no nipple discharge, no skin change and no tenderness. Breasts are symmetrical.  Abdominal: Soft. Bowel sounds are normal. She exhibits no distension and no mass. There is no tenderness. There is no rebound and no guarding.  Musculoskeletal: Normal range of motion. She exhibits no edema and no tenderness.  Lymphadenopathy:    She has no cervical adenopathy.  Neurological: She is alert and oriented to person, place, and time. No cranial nerve deficit. She exhibits normal muscle tone. Coordination normal.  Skin: Skin is warm and dry. No rash noted. She is not diaphoretic. No erythema. No pallor.  Psychiatric: She has a normal mood and affect. Her speech is normal and behavior is normal. Thought content normal. Cognition and memory are impaired. She expresses impulsivity.          Assessment & Plan:

## 2013-04-30 NOTE — Telephone Encounter (Signed)
Jeani Hawking from [facility] left a message stating the patient has physician orders in reference to the Gentamycin cream. There was no quantity or refill information the prescription. And also she already has a PRN order at [facility], does this one omit the need for PRN order or is this in addition to?

## 2013-05-01 NOTE — Telephone Encounter (Signed)
Called [facility] and spoke with Jeani Hawking, informed her to continue to the PRN order in addition to the BID. Just do the BID until it is clear, then go back to the PRN order. 1 tub with 2 refills.

## 2013-05-02 ENCOUNTER — Encounter: Payer: Medicaid Other | Admitting: Internal Medicine

## 2013-05-03 DIAGNOSIS — M25549 Pain in joints of unspecified hand: Secondary | ICD-10-CM | POA: Diagnosis not present

## 2013-05-03 DIAGNOSIS — M069 Rheumatoid arthritis, unspecified: Secondary | ICD-10-CM | POA: Diagnosis not present

## 2013-05-03 DIAGNOSIS — M810 Age-related osteoporosis without current pathological fracture: Secondary | ICD-10-CM | POA: Diagnosis not present

## 2013-05-03 DIAGNOSIS — M159 Polyosteoarthritis, unspecified: Secondary | ICD-10-CM | POA: Diagnosis not present

## 2013-05-04 ENCOUNTER — Encounter: Payer: Medicaid Other | Admitting: Internal Medicine

## 2013-05-07 ENCOUNTER — Encounter: Payer: Self-pay | Admitting: Internal Medicine

## 2013-05-14 ENCOUNTER — Other Ambulatory Visit: Payer: Self-pay | Admitting: Internal Medicine

## 2013-05-30 ENCOUNTER — Ambulatory Visit (INDEPENDENT_AMBULATORY_CARE_PROVIDER_SITE_OTHER): Payer: Medicare Other | Admitting: Podiatry

## 2013-05-30 ENCOUNTER — Encounter: Payer: Self-pay | Admitting: Podiatry

## 2013-05-30 VITALS — BP 119/85 | HR 72 | Resp 16 | Ht <= 58 in | Wt 170.0 lb

## 2013-05-30 DIAGNOSIS — B351 Tinea unguium: Secondary | ICD-10-CM | POA: Diagnosis not present

## 2013-05-30 DIAGNOSIS — Q828 Other specified congenital malformations of skin: Secondary | ICD-10-CM

## 2013-05-30 DIAGNOSIS — M79609 Pain in unspecified limb: Secondary | ICD-10-CM | POA: Diagnosis not present

## 2013-05-30 NOTE — Progress Notes (Signed)
She presents today with a chief complaint of painful elongated toenails with corns and calluses bilateral.  Objective: Vital signs are stable she is alert and oriented x3. Pulses remain palpable bilateral. Nails are thick yellow dystrophic onychomycotic with distal clavi noted bilateral toes.  Assessment: Pain in limb secondary to onychomycosis and corns and calluses.  Plan: Debridement of nails in thickness and length as cover service secondary to pain.

## 2013-06-06 DIAGNOSIS — E8941 Symptomatic postprocedural ovarian failure: Secondary | ICD-10-CM | POA: Diagnosis not present

## 2013-06-06 DIAGNOSIS — Z79899 Other long term (current) drug therapy: Secondary | ICD-10-CM | POA: Diagnosis not present

## 2013-06-16 LAB — HM DEXA SCAN

## 2013-06-27 ENCOUNTER — Inpatient Hospital Stay: Payer: Self-pay | Admitting: Internal Medicine

## 2013-06-27 DIAGNOSIS — R7309 Other abnormal glucose: Secondary | ICD-10-CM | POA: Diagnosis not present

## 2013-06-27 DIAGNOSIS — IMO0002 Reserved for concepts with insufficient information to code with codable children: Secondary | ICD-10-CM | POA: Diagnosis not present

## 2013-06-27 DIAGNOSIS — R918 Other nonspecific abnormal finding of lung field: Secondary | ICD-10-CM | POA: Diagnosis not present

## 2013-06-27 DIAGNOSIS — M069 Rheumatoid arthritis, unspecified: Secondary | ICD-10-CM | POA: Diagnosis present

## 2013-06-27 DIAGNOSIS — R1110 Vomiting, unspecified: Secondary | ICD-10-CM | POA: Diagnosis not present

## 2013-06-27 DIAGNOSIS — Z6839 Body mass index (BMI) 39.0-39.9, adult: Secondary | ICD-10-CM | POA: Diagnosis not present

## 2013-06-27 DIAGNOSIS — Z90710 Acquired absence of both cervix and uterus: Secondary | ICD-10-CM | POA: Diagnosis not present

## 2013-06-27 DIAGNOSIS — I959 Hypotension, unspecified: Secondary | ICD-10-CM | POA: Diagnosis not present

## 2013-06-27 DIAGNOSIS — M810 Age-related osteoporosis without current pathological fracture: Secondary | ICD-10-CM | POA: Diagnosis present

## 2013-06-27 DIAGNOSIS — I82409 Acute embolism and thrombosis of unspecified deep veins of unspecified lower extremity: Secondary | ICD-10-CM | POA: Diagnosis present

## 2013-06-27 DIAGNOSIS — I447 Left bundle-branch block, unspecified: Secondary | ICD-10-CM | POA: Diagnosis present

## 2013-06-27 DIAGNOSIS — J45901 Unspecified asthma with (acute) exacerbation: Secondary | ICD-10-CM | POA: Diagnosis present

## 2013-06-27 DIAGNOSIS — E669 Obesity, unspecified: Secondary | ICD-10-CM | POA: Diagnosis present

## 2013-06-27 DIAGNOSIS — M1990 Unspecified osteoarthritis, unspecified site: Secondary | ICD-10-CM | POA: Diagnosis present

## 2013-06-27 DIAGNOSIS — R109 Unspecified abdominal pain: Secondary | ICD-10-CM | POA: Diagnosis not present

## 2013-06-27 DIAGNOSIS — R112 Nausea with vomiting, unspecified: Secondary | ICD-10-CM | POA: Diagnosis not present

## 2013-06-27 DIAGNOSIS — R197 Diarrhea, unspecified: Secondary | ICD-10-CM | POA: Diagnosis not present

## 2013-06-27 DIAGNOSIS — E039 Hypothyroidism, unspecified: Secondary | ICD-10-CM | POA: Diagnosis present

## 2013-06-27 DIAGNOSIS — R82998 Other abnormal findings in urine: Secondary | ICD-10-CM | POA: Diagnosis present

## 2013-06-27 DIAGNOSIS — K219 Gastro-esophageal reflux disease without esophagitis: Secondary | ICD-10-CM | POA: Diagnosis present

## 2013-06-27 DIAGNOSIS — N390 Urinary tract infection, site not specified: Secondary | ICD-10-CM | POA: Diagnosis not present

## 2013-06-27 DIAGNOSIS — I10 Essential (primary) hypertension: Secondary | ICD-10-CM | POA: Diagnosis present

## 2013-06-27 DIAGNOSIS — K5900 Constipation, unspecified: Secondary | ICD-10-CM | POA: Diagnosis present

## 2013-06-27 DIAGNOSIS — Z8279 Family history of other congenital malformations, deformations and chromosomal abnormalities: Secondary | ICD-10-CM | POA: Diagnosis not present

## 2013-06-27 DIAGNOSIS — I459 Conduction disorder, unspecified: Secondary | ICD-10-CM | POA: Diagnosis present

## 2013-06-27 DIAGNOSIS — K5100 Ulcerative (chronic) pancolitis without complications: Secondary | ICD-10-CM | POA: Diagnosis not present

## 2013-06-27 DIAGNOSIS — Q909 Down syndrome, unspecified: Secondary | ICD-10-CM | POA: Diagnosis not present

## 2013-06-27 DIAGNOSIS — Z86718 Personal history of other venous thrombosis and embolism: Secondary | ICD-10-CM | POA: Diagnosis not present

## 2013-06-27 DIAGNOSIS — R933 Abnormal findings on diagnostic imaging of other parts of digestive tract: Secondary | ICD-10-CM | POA: Diagnosis not present

## 2013-06-27 DIAGNOSIS — Z881 Allergy status to other antibiotic agents status: Secondary | ICD-10-CM | POA: Diagnosis not present

## 2013-06-27 DIAGNOSIS — K56609 Unspecified intestinal obstruction, unspecified as to partial versus complete obstruction: Secondary | ICD-10-CM | POA: Diagnosis present

## 2013-06-27 LAB — URINALYSIS, COMPLETE
BACTERIA: NONE SEEN
BILIRUBIN, UR: NEGATIVE
BLOOD: NEGATIVE
Glucose,UR: NEGATIVE mg/dL (ref 0–75)
Ketone: NEGATIVE
NITRITE: NEGATIVE
Ph: 6 (ref 4.5–8.0)
Protein: 30
RBC,UR: 2 /HPF (ref 0–5)
Specific Gravity: 1.019 (ref 1.003–1.030)

## 2013-06-27 LAB — CBC
HCT: 42.5 % (ref 35.0–47.0)
HGB: 14.6 g/dL (ref 12.0–16.0)
MCH: 36.4 pg — ABNORMAL HIGH (ref 26.0–34.0)
MCHC: 34.4 g/dL (ref 32.0–36.0)
MCV: 106 fL — ABNORMAL HIGH (ref 80–100)
Platelet: 284 10*3/uL (ref 150–440)
RBC: 4.02 10*6/uL (ref 3.80–5.20)
RDW: 15.5 % — ABNORMAL HIGH (ref 11.5–14.5)
WBC: 11.5 10*3/uL — AB (ref 3.6–11.0)

## 2013-06-27 LAB — COMPREHENSIVE METABOLIC PANEL
Albumin: 3.2 g/dL — ABNORMAL LOW (ref 3.4–5.0)
Alkaline Phosphatase: 79 U/L
Anion Gap: 7 (ref 7–16)
BUN: 13 mg/dL (ref 7–18)
Bilirubin,Total: 0.6 mg/dL (ref 0.2–1.0)
CALCIUM: 8.9 mg/dL (ref 8.5–10.1)
CHLORIDE: 104 mmol/L (ref 98–107)
Co2: 26 mmol/L (ref 21–32)
Creatinine: 0.98 mg/dL (ref 0.60–1.30)
EGFR (African American): >60
Glucose: 141 mg/dL — ABNORMAL HIGH (ref 65–99)
Osmolality: 276 (ref 275–301)
Potassium: 3.6 mmol/L (ref 3.5–5.1)
SGOT(AST): 28 U/L (ref 15–37)
SGPT (ALT): 22 U/L (ref 12–78)
Sodium: 137 mmol/L (ref 136–145)
TOTAL PROTEIN: 6.9 g/dL (ref 6.4–8.2)

## 2013-06-27 LAB — HEMOGLOBIN A1C: HEMOGLOBIN A1C: 5.7 % (ref 4.2–6.3)

## 2013-06-27 LAB — LIPASE, BLOOD: Lipase: 96 U/L (ref 73–393)

## 2013-06-27 LAB — TROPONIN I: Troponin-I: <0.02 ng/mL

## 2013-06-28 ENCOUNTER — Telehealth: Payer: Self-pay | Admitting: Internal Medicine

## 2013-06-28 DIAGNOSIS — K5100 Ulcerative (chronic) pancolitis without complications: Secondary | ICD-10-CM | POA: Diagnosis not present

## 2013-06-28 DIAGNOSIS — R112 Nausea with vomiting, unspecified: Secondary | ICD-10-CM | POA: Diagnosis not present

## 2013-06-28 DIAGNOSIS — I959 Hypotension, unspecified: Secondary | ICD-10-CM | POA: Diagnosis not present

## 2013-06-28 DIAGNOSIS — R7309 Other abnormal glucose: Secondary | ICD-10-CM | POA: Diagnosis not present

## 2013-06-28 DIAGNOSIS — N390 Urinary tract infection, site not specified: Secondary | ICD-10-CM | POA: Diagnosis not present

## 2013-06-28 DIAGNOSIS — Q909 Down syndrome, unspecified: Secondary | ICD-10-CM | POA: Diagnosis not present

## 2013-06-28 DIAGNOSIS — R933 Abnormal findings on diagnostic imaging of other parts of digestive tract: Secondary | ICD-10-CM | POA: Diagnosis not present

## 2013-06-28 LAB — COMPREHENSIVE METABOLIC PANEL
ALBUMIN: 2.2 g/dL — AB (ref 3.4–5.0)
Alkaline Phosphatase: 61 U/L
Anion Gap: 6 — ABNORMAL LOW (ref 7–16)
BUN: 10 mg/dL (ref 7–18)
Bilirubin,Total: 0.4 mg/dL (ref 0.2–1.0)
CALCIUM: 7.6 mg/dL — AB (ref 8.5–10.1)
CO2: 25 mmol/L (ref 21–32)
Chloride: 111 mmol/L — ABNORMAL HIGH (ref 98–107)
Creatinine: 0.74 mg/dL (ref 0.60–1.30)
EGFR (African American): >60
EGFR (Non-African Amer.): >60
GLUCOSE: 91 mg/dL (ref 65–99)
OSMOLALITY: 282 (ref 275–301)
POTASSIUM: 3.8 mmol/L (ref 3.5–5.1)
SGOT(AST): 21 U/L (ref 15–37)
SGPT (ALT): 14 U/L (ref 12–78)
Sodium: 142 mmol/L (ref 136–145)
Total Protein: 5.4 g/dL — ABNORMAL LOW (ref 6.4–8.2)

## 2013-06-28 LAB — CBC WITH DIFFERENTIAL/PLATELET
BASOS ABS: 0 10*3/uL (ref 0.0–0.1)
Basophil %: 0.2 %
EOS PCT: 0 %
Eosinophil #: 0 10*3/uL (ref 0.0–0.7)
HCT: 36.1 % (ref 35.0–47.0)
HGB: 12.3 g/dL (ref 12.0–16.0)
Lymphocyte #: 0.7 10*3/uL — ABNORMAL LOW (ref 1.0–3.6)
Lymphocyte %: 8.5 %
MCH: 36.8 pg — ABNORMAL HIGH (ref 26.0–34.0)
MCHC: 34.2 g/dL (ref 32.0–36.0)
MCV: 108 fL — ABNORMAL HIGH (ref 80–100)
MONOS PCT: 3.4 %
Monocyte #: 0.3 x10 3/mm (ref 0.2–0.9)
NEUTROS ABS: 6.8 10*3/uL — AB (ref 1.4–6.5)
NEUTROS PCT: 87.9 %
Platelet: 215 10*3/uL (ref 150–440)
RBC: 3.35 10*6/uL — AB (ref 3.80–5.20)
RDW: 15.6 % — AB (ref 11.5–14.5)
WBC: 7.7 10*3/uL (ref 3.6–11.0)

## 2013-06-28 NOTE — Telephone Encounter (Signed)
The patient is being discharged today . I do not have any available appointments to schedule the patient for a hospital follow up. Please advise as to where to put this patient.

## 2013-06-29 LAB — URINE CULTURE

## 2013-07-04 NOTE — Telephone Encounter (Signed)
Cindy Oconnor will put her in for me as I was not able to do it.

## 2013-07-04 NOTE — Telephone Encounter (Signed)
4:15 on Thursday the 26th of February,we can add her as a 27mn. Otherwise, will need to look at Raquel's schedule.

## 2013-07-04 NOTE — Telephone Encounter (Signed)
Any suggestions on where we can put her in for hospital follow up

## 2013-07-05 ENCOUNTER — Ambulatory Visit: Payer: Medicare Other | Admitting: Internal Medicine

## 2013-07-26 ENCOUNTER — Ambulatory Visit: Payer: Medicare Other | Admitting: Internal Medicine

## 2013-07-30 DIAGNOSIS — K5900 Constipation, unspecified: Secondary | ICD-10-CM | POA: Diagnosis not present

## 2013-07-30 DIAGNOSIS — K5100 Ulcerative (chronic) pancolitis without complications: Secondary | ICD-10-CM | POA: Diagnosis not present

## 2013-08-01 ENCOUNTER — Ambulatory Visit: Payer: Medicare Other | Admitting: Podiatry

## 2013-08-01 ENCOUNTER — Encounter: Payer: Self-pay | Admitting: Podiatry

## 2013-08-01 ENCOUNTER — Ambulatory Visit (INDEPENDENT_AMBULATORY_CARE_PROVIDER_SITE_OTHER): Payer: Medicare Other | Admitting: Podiatry

## 2013-08-01 VITALS — BP 110/59 | HR 64 | Resp 16

## 2013-08-01 DIAGNOSIS — B351 Tinea unguium: Secondary | ICD-10-CM

## 2013-08-01 DIAGNOSIS — M79609 Pain in unspecified limb: Secondary | ICD-10-CM | POA: Diagnosis not present

## 2013-08-01 NOTE — Progress Notes (Signed)
Toenails corns and calluses the left # 2 toe is real sore.  Objective evaluation: Vital signs are stable she is alert and oriented x3. Pulses are palpable bilateral. Her nails are thick yellow dystrophic clinically mycotic and painful on palpation.  Assessment: Pain in limb secondary to onychomycosis 1 through 5 bilateral. Down's syndrome.  Plan: Debridement of nails 1 through 5 bilateral covered service secondary to pain.

## 2013-08-02 ENCOUNTER — Other Ambulatory Visit: Payer: Self-pay | Admitting: Internal Medicine

## 2013-08-02 ENCOUNTER — Other Ambulatory Visit: Payer: Self-pay | Admitting: *Deleted

## 2013-08-02 NOTE — Telephone Encounter (Signed)
Ok refill? 

## 2013-08-02 NOTE — Telephone Encounter (Signed)
Refill Request  Senexon tablet   #30   Take one tablet by mouth at bedtime

## 2013-08-02 NOTE — Telephone Encounter (Signed)
Fine to refill.

## 2013-08-03 MED ORDER — SENNOSIDES 8.6 MG PO TABS: 1.0000 | ORAL_TABLET | Freq: Every day | ORAL | Status: DC

## 2013-08-09 DIAGNOSIS — M069 Rheumatoid arthritis, unspecified: Secondary | ICD-10-CM | POA: Diagnosis not present

## 2013-08-09 DIAGNOSIS — Z79899 Other long term (current) drug therapy: Secondary | ICD-10-CM | POA: Diagnosis not present

## 2013-08-24 DIAGNOSIS — IMO0002 Reserved for concepts with insufficient information to code with codable children: Secondary | ICD-10-CM | POA: Diagnosis not present

## 2013-08-24 DIAGNOSIS — M1710 Unilateral primary osteoarthritis, unspecified knee: Secondary | ICD-10-CM | POA: Diagnosis not present

## 2013-09-03 ENCOUNTER — Telehealth: Payer: Self-pay | Admitting: *Deleted

## 2013-09-03 MED ORDER — CHLORHEXIDINE GLUCONATE 0.12 % MT SOLN: 15.0000 mL | Freq: Two times a day (BID) | OROMUCOSAL | Status: DC

## 2013-09-03 NOTE — Telephone Encounter (Signed)
Refill sent.

## 2013-09-03 NOTE — Telephone Encounter (Signed)
The group home need orders faxed to them stating the Eucerin cream is PRN and not daily since patient does not use this everyday. Please fax to [phone]

## 2013-09-03 NOTE — Telephone Encounter (Signed)
Refill request  Chlorhexidin 0.12%  480 ML   Use 15 ML in the mouth or throat twice a day

## 2013-09-03 NOTE — Telephone Encounter (Signed)
Fine to send order.

## 2013-09-04 ENCOUNTER — Encounter: Payer: Self-pay | Admitting: *Deleted

## 2013-09-04 NOTE — Telephone Encounter (Signed)
Printed and faxed

## 2013-09-11 ENCOUNTER — Telehealth: Payer: Self-pay | Admitting: Internal Medicine

## 2013-09-11 NOTE — Telephone Encounter (Signed)
Mother came into office today.  POA/guardianship form scanned to documents.  Also has form that needs to be completed by Dr. Gilford Rile for pt to attend camp.  Placed in Dr. Thomes Dinning box.  States she has to turn in by May 1.  Would like a call to let her know when ready for pickup

## 2013-09-14 NOTE — Telephone Encounter (Signed)
Do you have this? I do not see it at nurse's desk

## 2013-09-14 NOTE — Telephone Encounter (Signed)
I haven't seen any camp form.

## 2013-09-14 NOTE — Telephone Encounter (Signed)
Pt called to check status of camp form.  Pt states she may have forgotten to sign it and she can come in to sign.  Please contact pt when ready for pick up and she will come in to sign. Needs by 5/1.

## 2013-09-14 NOTE — Telephone Encounter (Signed)
Pt's mother notified that Dr. Gilford Rile did not receive a camp form. Requested to have her drop off another one to be completed, verbalized understanding.

## 2013-10-04 ENCOUNTER — Other Ambulatory Visit: Payer: Self-pay | Admitting: *Deleted

## 2013-10-04 NOTE — Telephone Encounter (Signed)
Ok refill? 

## 2013-10-10 ENCOUNTER — Ambulatory Visit: Payer: Medicare Other | Admitting: Podiatry

## 2013-10-16 ENCOUNTER — Other Ambulatory Visit: Payer: Self-pay | Admitting: Internal Medicine

## 2013-10-16 NOTE — Telephone Encounter (Signed)
Refill

## 2013-10-18 ENCOUNTER — Telehealth: Payer: Self-pay | Admitting: Internal Medicine

## 2013-10-18 NOTE — Telephone Encounter (Signed)
Form completed and placed in folder for pick up, left vm advising form is ready

## 2013-10-18 NOTE — Telephone Encounter (Signed)
Patients' mother dropped off a form for camp that needs to be completed by Dr. Gilford Rile. Form needs information on the last completed physical from the last 24 months. Please call mother when form is ready to be picked up.

## 2013-10-19 ENCOUNTER — Other Ambulatory Visit: Payer: Self-pay | Admitting: Adult Health

## 2013-10-19 NOTE — Telephone Encounter (Signed)
Ok to refill medication

## 2013-11-01 ENCOUNTER — Ambulatory Visit: Payer: Medicaid Other | Admitting: Internal Medicine

## 2013-11-01 ENCOUNTER — Ambulatory Visit (INDEPENDENT_AMBULATORY_CARE_PROVIDER_SITE_OTHER): Payer: Medicare Other | Admitting: Internal Medicine

## 2013-11-01 ENCOUNTER — Encounter: Payer: Self-pay | Admitting: Internal Medicine

## 2013-11-01 VITALS — BP 100/60 | HR 91 | Temp 98.8°F | Ht <= 58 in | Wt 162.5 lb

## 2013-11-01 DIAGNOSIS — M069 Rheumatoid arthritis, unspecified: Secondary | ICD-10-CM | POA: Diagnosis not present

## 2013-11-01 DIAGNOSIS — J45909 Unspecified asthma, uncomplicated: Secondary | ICD-10-CM

## 2013-11-01 DIAGNOSIS — Z79899 Other long term (current) drug therapy: Secondary | ICD-10-CM | POA: Diagnosis not present

## 2013-11-01 DIAGNOSIS — J209 Acute bronchitis, unspecified: Secondary | ICD-10-CM

## 2013-11-01 MED ORDER — LEVALBUTEROL HCL 0.63 MG/3ML IN NEBU: 0.6300 mg | INHALATION_SOLUTION | Freq: Three times a day (TID) | RESPIRATORY_TRACT | Status: DC | PRN

## 2013-11-01 MED ORDER — TIOTROPIUM BROMIDE MONOHYDRATE 18 MCG IN CAPS: ORAL_CAPSULE | RESPIRATORY_TRACT | Status: DC

## 2013-11-01 MED ORDER — ALBUTEROL SULFATE (2.5 MG/3ML) 0.083% IN NEBU
2.5000 mg | INHALATION_SOLUTION | Freq: Once | RESPIRATORY_TRACT | Status: AC
  Administered 2013-11-01: 2.5 mg via RESPIRATORY_TRACT

## 2013-11-01 MED ORDER — PREDNISONE 10 MG PO TABS: ORAL_TABLET | ORAL | Status: DC

## 2013-11-01 MED ORDER — BUDESONIDE-FORMOTEROL FUMARATE 160-4.5 MCG/ACT IN AERO: INHALATION_SPRAY | RESPIRATORY_TRACT | Status: DC

## 2013-11-01 MED ORDER — AMOXICILLIN-POT CLAVULANATE 875-125 MG PO TABS: 1.0000 | ORAL_TABLET | Freq: Two times a day (BID) | ORAL | Status: DC

## 2013-11-01 NOTE — Progress Notes (Signed)
Pre visit review using our clinic review tool, if applicable. No additional management support is needed unless otherwise documented below in the visit note. 

## 2013-11-01 NOTE — Assessment & Plan Note (Signed)
Symptoms c/w asthma exacerbation. Will start Prednisone, Augmentin as noted. Continue Xopenex q4hr prn. Continue Symbicort and Spiriva. If no improvement or of worsening symptoms, caregivers will call.

## 2013-11-01 NOTE — Assessment & Plan Note (Addendum)
Symptoms and exam consistent with acute bronchitis with exacerbation of asthma. Initial mild hypoxia with sats 89-93%. Symptoms improved significantly with albuterol neb, with normalization of sats to 98-99%. . Will start Prednisone taper and oral Augmentin. Will continue inhaled Xopenex prn at home and scheduled Symbicort and Spiriva.

## 2013-11-01 NOTE — Progress Notes (Signed)
Subjective:    Patient ID: Cindy Oconnor, female    DOB: 07-09-1970, 43 y.o.   MRN: [id number]  HPI 43YO female presents for follow up.  Not feeling well. Started feeling bad last night. Unable to get her medicine down. States that water will not "go down." Feels like something caught in throat. Has been coughing throughout day.  Feeling okay before this. Occasional wheezing. No fever, chills. No nasal congestion. Has been using inhalers as directed.  Review of Systems  Constitutional: Negative for fever, chills and unexpected weight change.  HENT: Negative for congestion, ear discharge, ear pain, facial swelling, hearing loss, mouth sores, nosebleeds, postnasal drip, rhinorrhea, sinus pressure, sneezing, sore throat, tinnitus, trouble swallowing and voice change.   Eyes: Negative for pain, discharge, redness and visual disturbance.  Respiratory: Positive for chest tightness, shortness of breath and wheezing. Negative for cough and stridor.   Cardiovascular: Negative for chest pain, palpitations and leg swelling.  Musculoskeletal: Negative for arthralgias, myalgias, neck pain and neck stiffness.  Skin: Negative for color change and rash.  Neurological: Negative for dizziness, weakness, light-headedness and headaches.  Hematological: Negative for adenopathy.       Objective:    BP 100/60  Pulse 91  Temp(Src) 98.8 F (37.1 C) (Oral)  Ht 4' (1.219 m)  Wt 162 lb 8 oz (73.71 kg)  BMI 49.60 kg/m2  SpO2 89%  LMP 04/11/1981 Physical Exam  Constitutional: She is oriented to person, place, and time. She appears well-developed and well-nourished. No distress.  HENT:  Head: Normocephalic and atraumatic.  Right Ear: External ear normal.  Left Ear: External ear normal.  Nose: Nose normal.  Mouth/Throat: Oropharynx is clear and moist. No oropharyngeal exudate.  Eyes: Conjunctivae are normal. Pupils are equal, round, and reactive to light. Right eye exhibits no discharge. Left eye  exhibits no discharge. No scleral icterus.  Neck: Normal range of motion. Neck supple. No tracheal deviation present. No thyromegaly present.  Cardiovascular: Normal rate, regular rhythm, normal heart sounds and intact distal pulses.  Exam reveals no gallop and no friction rub.   No murmur heard. Pulmonary/Chest: Effort normal. No accessory muscle usage. Not tachypneic. No respiratory distress. She has no decreased breath sounds. She has wheezes. She has rhonchi (scattered). She has no rales. She exhibits no tenderness.  Musculoskeletal: Normal range of motion. She exhibits no edema and no tenderness.  Lymphadenopathy:    She has no cervical adenopathy.  Neurological: She is alert and oriented to person, place, and time. No cranial nerve deficit. She exhibits normal muscle tone. Coordination normal.  Skin: Skin is warm and dry. No rash noted. She is not diaphoretic. No erythema. No pallor.  Psychiatric: She has a normal mood and affect. Her behavior is normal. Judgment and thought content normal.          Assessment & Plan:   Problem List Items Addressed This Visit     Unprioritized   Acute bronchitis - Primary     Symptoms and exam consistent with acute bronchitis with exacerbation of asthma. Initial mild hypoxia with sats 89-93%. Symptoms improved significantly with albuterol neb, with normalization of sats to 98-99%. . Will start Prednisone taper and oral Augmentin. Will continue inhaled Xopenex prn at home and scheduled Symbicort and Spiriva.    Relevant Medications      AMOXICILLIN-POT CLAVULANATE 875-125 MG PO TABS      predniSONE (DELTASONE) tablet   ASTHMA     Symptoms c/w asthma exacerbation. Will start Prednisone, Augmentin  as noted. Continue Xopenex q4hr prn. Continue Symbicort and Spiriva. If no improvement or of worsening symptoms, caregivers will call.    Relevant Medications      budesonide-formoterol (SYMBICORT) 160-4.5 MCG/ACT inhaler      tiotropium (SPIRIVA  HANDIHALER) 18 MCG inhalation capsule      levalbuterol (XOPENEX) 0.63 MG/3ML nebulizer solution      predniSONE (DELTASONE) tablet      albuterol (PROVENTIL) (2.5 MG/3ML) 0.083% nebulizer solution 2.5 mg (Completed)       Return in about 4 days (around 11/05/2013).

## 2013-11-01 NOTE — Patient Instructions (Signed)
Hold Methotrexate this weekend.  Start Prednisone taper. (Continue daily prednisone dose)  Start Augmentin twice daily for bronchitis.  Continue Xopenex, Spiriva, and Symbicort.  Follow up for recheck on Monday afternoon 2:30pm.  If any worsening shortness of breath over the weekend, please call immediately.

## 2013-11-05 ENCOUNTER — Ambulatory Visit: Payer: Medicare Other | Admitting: Internal Medicine

## 2013-11-07 ENCOUNTER — Ambulatory Visit: Payer: Medicare Other | Admitting: Internal Medicine

## 2013-11-07 ENCOUNTER — Ambulatory Visit: Payer: Medicare Other | Admitting: Podiatry

## 2013-11-14 ENCOUNTER — Ambulatory Visit: Payer: Medicare Other | Admitting: Internal Medicine

## 2013-11-14 ENCOUNTER — Telehealth: Payer: Self-pay | Admitting: Internal Medicine

## 2013-11-14 NOTE — Telephone Encounter (Signed)
Patient arrived for appt at 1320. Patient and caregiver advised that she was late for the appt and will not be seen. Option to reschedule was given. Caregiver stated that they will call back to reschedule/msn

## 2013-11-15 ENCOUNTER — Ambulatory Visit (INDEPENDENT_AMBULATORY_CARE_PROVIDER_SITE_OTHER): Payer: Medicare Other | Admitting: Podiatry

## 2013-11-15 ENCOUNTER — Encounter: Payer: Self-pay | Admitting: Podiatry

## 2013-11-15 DIAGNOSIS — M79673 Pain in unspecified foot: Secondary | ICD-10-CM

## 2013-11-15 DIAGNOSIS — B351 Tinea unguium: Secondary | ICD-10-CM | POA: Diagnosis not present

## 2013-11-15 DIAGNOSIS — M79609 Pain in unspecified limb: Secondary | ICD-10-CM | POA: Diagnosis not present

## 2013-11-15 DIAGNOSIS — Q828 Other specified congenital malformations of skin: Secondary | ICD-10-CM

## 2013-11-15 NOTE — Progress Notes (Signed)
She presents today with a chief complaint of painful elongated toenails and painful porokeratosis.  Objective: Pulses are strongly palpable bilateral. Nails are thick yellow dystrophic onychomycotic and painful palpation. Porokeratotic lesions distal aspect of the toes and sub-worst and sub-fifth bilateral.  Assessment: Pain in limb secondary to onychomycosis and porokeratosis bilateral.  Plan: Debridement of all reactive hyperkeratosis and debridement of reactive nail growth bilateral.

## 2013-12-03 ENCOUNTER — Telehealth: Payer: Self-pay | Admitting: Internal Medicine

## 2013-12-03 NOTE — Telephone Encounter (Signed)
Pt needs form filled out for camp. Form in Dr. Derry Skill box.

## 2013-12-04 NOTE — Telephone Encounter (Signed)
Form completed and ready for pick up.  Left vm to return my call

## 2014-01-11 ENCOUNTER — Other Ambulatory Visit: Payer: Self-pay | Admitting: Internal Medicine

## 2014-01-17 ENCOUNTER — Telehealth: Payer: Self-pay | Admitting: *Deleted

## 2014-01-17 NOTE — Telephone Encounter (Addendum)
Fax from pharmacy requesting Calcium Carbonate 1250 mg, #30, one tablet PO daily and Vitamin D3 1000 U, #30, one tablet PO daily..  Medication not on med list.  Please advise refill.

## 2014-01-17 NOTE — Telephone Encounter (Signed)
Fine to call these in with 3 refills

## 2014-01-18 MED ORDER — CALCIUM CARBONATE 1250 (500 CA) MG PO CAPS: 1250.0000 mg | ORAL_CAPSULE | Freq: Two times a day (BID) | ORAL | Status: DC

## 2014-01-18 MED ORDER — VITAMIN D3 25 MCG (1000 UT) PO CAPS: 1000.0000 [IU] | ORAL_CAPSULE | Freq: Every day | ORAL | Status: DC

## 2014-01-18 NOTE — Telephone Encounter (Signed)
Rx sent 

## 2014-01-30 ENCOUNTER — Ambulatory Visit: Payer: Medicare Other | Admitting: Podiatry

## 2014-01-30 DIAGNOSIS — M818 Other osteoporosis without current pathological fracture: Secondary | ICD-10-CM | POA: Insufficient documentation

## 2014-01-30 DIAGNOSIS — S93336A Other dislocation of unspecified foot, initial encounter: Secondary | ICD-10-CM | POA: Diagnosis not present

## 2014-01-30 DIAGNOSIS — M24476 Recurrent dislocation, unspecified foot: Secondary | ICD-10-CM | POA: Diagnosis not present

## 2014-01-30 DIAGNOSIS — M19079 Primary osteoarthritis, unspecified ankle and foot: Secondary | ICD-10-CM | POA: Diagnosis not present

## 2014-01-30 DIAGNOSIS — M1990 Unspecified osteoarthritis, unspecified site: Secondary | ICD-10-CM | POA: Insufficient documentation

## 2014-01-30 DIAGNOSIS — M24473 Recurrent dislocation, unspecified ankle: Secondary | ICD-10-CM | POA: Diagnosis not present

## 2014-01-30 DIAGNOSIS — M25469 Effusion, unspecified knee: Secondary | ICD-10-CM | POA: Diagnosis not present

## 2014-01-30 DIAGNOSIS — M810 Age-related osteoporosis without current pathological fracture: Secondary | ICD-10-CM

## 2014-01-30 DIAGNOSIS — M069 Rheumatoid arthritis, unspecified: Secondary | ICD-10-CM | POA: Diagnosis not present

## 2014-01-30 DIAGNOSIS — K529 Noninfective gastroenteritis and colitis, unspecified: Secondary | ICD-10-CM | POA: Insufficient documentation

## 2014-01-30 DIAGNOSIS — R894 Abnormal immunological findings in specimens from other organs, systems and tissues: Secondary | ICD-10-CM | POA: Diagnosis not present

## 2014-01-30 DIAGNOSIS — E559 Vitamin D deficiency, unspecified: Secondary | ICD-10-CM | POA: Diagnosis not present

## 2014-01-30 DIAGNOSIS — M064 Inflammatory polyarthropathy: Secondary | ICD-10-CM | POA: Diagnosis not present

## 2014-01-30 DIAGNOSIS — M25569 Pain in unspecified knee: Secondary | ICD-10-CM | POA: Diagnosis not present

## 2014-01-30 DIAGNOSIS — M21169 Varus deformity, not elsewhere classified, unspecified knee: Secondary | ICD-10-CM | POA: Diagnosis not present

## 2014-01-30 DIAGNOSIS — M329 Systemic lupus erythematosus, unspecified: Secondary | ICD-10-CM | POA: Diagnosis not present

## 2014-01-30 DIAGNOSIS — Q909 Down syndrome, unspecified: Secondary | ICD-10-CM | POA: Diagnosis not present

## 2014-01-30 HISTORY — DX: Age-related osteoporosis without current pathological fracture: M81.0

## 2014-01-30 HISTORY — DX: Unspecified osteoarthritis, unspecified site: M19.90

## 2014-01-31 ENCOUNTER — Ambulatory Visit (INDEPENDENT_AMBULATORY_CARE_PROVIDER_SITE_OTHER): Payer: Medicare Other | Admitting: Podiatry

## 2014-01-31 DIAGNOSIS — M79673 Pain in unspecified foot: Secondary | ICD-10-CM

## 2014-01-31 DIAGNOSIS — M79609 Pain in unspecified limb: Secondary | ICD-10-CM | POA: Diagnosis not present

## 2014-01-31 DIAGNOSIS — B351 Tinea unguium: Secondary | ICD-10-CM | POA: Diagnosis not present

## 2014-01-31 DIAGNOSIS — Q828 Other specified congenital malformations of skin: Secondary | ICD-10-CM

## 2014-01-31 DIAGNOSIS — B353 Tinea pedis: Secondary | ICD-10-CM

## 2014-01-31 NOTE — Progress Notes (Signed)
Patient ID: Cindy Oconnor, female   DOB: 08-21-1970, 43 y.o.   MRN: [id number]  43 year old female presents the office today with a caregiver from her facility for painful elongated nails as well as painful hyperkeratotic lesions. Patient does have new complaints of left foot and she rash over the plantar and inside aspect of her feet. His his been ongoing for approximately a week. Her caregiver states that she has multiple creams and lotions that she applies to her feet daily. Per the patient she denies any new ointments. Denies any systemic complaints as fevers, chills, nausea, vomiting. No other complaints today.  Objective: AAO x3, NAD DP/PT pulses palpable b/l, CRT < 3sec Protective sensation intact with Simms Weinstein monofilament,  vibratory sensation intact. Nails are hypertrophic, dystrophic, elongated, yellow discoloration x10. Hyperkeratotic lesions left foot submetatarsal 1, heel; right foot submetatarsal 1, heel, second and third distal digits. Erythematous scaly rash on the plantar aspect of the foot and along the medial aspect of the foot. There is no evidence of ascending cellulitis; no increased warmth over the area, no fluctuance no crepitus. ROM WNL, MMT 5/5 No leg pain/swelling/warmth  Assessment:  43 year old female with symptomatic onychomycosis, painful hyperkeratotic lesions x6; likely at tinea pedis.  Plan: -Various treatment options discussed in detail including alternatives, risks, complications. -Nail sharply debrided x10. On the left third digit there was skin growing around the nail and there was a small amount of blood from under the nail. Nail cleaned and antibiotic ointment/band-aid applied. Continue this until healed.  -Recommended Lotrisone cream for likely tinea pedis -Hyperkeratotic lesions sharply debrided x6 without complications.  -Monitor for any clinical signs or symptoms of infection and directed to call the office immediately if any are to  occur or go directly to the emergency room. -Call with any questions, concerns, or change in symptoms. -F/U in one week to recheck symptoms, or sooner if any problems arise.

## 2014-01-31 NOTE — Patient Instructions (Signed)
Apply lotrisone cream to left foot twice a day.    Antibiotic ointment/bandaid daily to left third digit until healed.   Monitor for any signs/symptoms of infection. Call the office immediately if any occur or go directly to the emergency room. Call with any questions/concerns.

## 2014-02-06 ENCOUNTER — Other Ambulatory Visit: Payer: Self-pay | Admitting: Internal Medicine

## 2014-02-07 ENCOUNTER — Ambulatory Visit (INDEPENDENT_AMBULATORY_CARE_PROVIDER_SITE_OTHER): Payer: Medicare Other | Admitting: Podiatry

## 2014-02-07 VITALS — BP 120/73 | HR 75 | Resp 16

## 2014-02-07 DIAGNOSIS — B353 Tinea pedis: Secondary | ICD-10-CM | POA: Diagnosis not present

## 2014-02-07 NOTE — Patient Instructions (Signed)
Monitor for any signs/symptoms of infection. Call the office immediately if any occur or go directly to the emergency room. Call with any questions/concerns. Call with any questions/concerns/change in symptoms.

## 2014-02-09 NOTE — Progress Notes (Signed)
Patient ID: Cindy Oconnor, female   DOB: 21-Sep-1970, 43 y.o.   MRN: [id number]  Subjective: 43 year old female returns the office today for followup evaluation of left foot tinea pedis. States that she has started using the Lotrisone cream twice a day and she states that she is getting some relief with the cream. She has no new complaints today. Denies any systemic complaints such as fever, chills, nausea, vomiting.  Objective: AAO x3, NAD Neurovascular status unchanged. Left foot with erythematous scaly rash on the plantar in the medial aspect of the foot. There is no ascending cellulitis, warmth, fluctuance, crepitus over the area.  Left third digit nail healed without any surrounding erythema, drainage. No leg pain, warmth, swelling.  Assessment: 43 year old female presents for followup evaluation of tinea pedis.  Plan: -Treatment options discussed with her and her caregiver who accompanied her at today's visit including alternatives, risks, complications. -Continue Lotrisone cream. If not responding to this cream directed to call the office. -Monitor for any clinical signs or symptoms of infection and directed to call the office immediately if any are to occur or go directly to the emergency room. -Followup in one month to recheck symptoms or call sooner if any problems are to arise or any change in symptoms.

## 2014-02-19 ENCOUNTER — Ambulatory Visit: Payer: Self-pay | Admitting: Internal Medicine

## 2014-02-19 DIAGNOSIS — Z1231 Encounter for screening mammogram for malignant neoplasm of breast: Secondary | ICD-10-CM | POA: Diagnosis not present

## 2014-02-19 LAB — HM MAMMOGRAPHY: HM Mammogram: NEGATIVE

## 2014-02-20 ENCOUNTER — Encounter: Payer: Self-pay | Admitting: *Deleted

## 2014-02-26 ENCOUNTER — Other Ambulatory Visit: Payer: Self-pay | Admitting: *Deleted

## 2014-02-26 MED ORDER — CLOTRIMAZOLE-BETAMETHASONE 1-0.05 % EX CREA: 1.0000 "application " | TOPICAL_CREAM | Freq: Two times a day (BID) | CUTANEOUS | Status: DC

## 2014-02-26 NOTE — Telephone Encounter (Signed)
Per dr Milinda Pointer refill clotrimazole-betameth crm 45 gm. With 11 refills. [facility] sent over refill request.

## 2014-03-07 ENCOUNTER — Ambulatory Visit (INDEPENDENT_AMBULATORY_CARE_PROVIDER_SITE_OTHER): Payer: Medicare Other | Admitting: Podiatry

## 2014-03-07 VITALS — BP 99/66 | HR 73 | Resp 16

## 2014-03-07 DIAGNOSIS — B353 Tinea pedis: Secondary | ICD-10-CM | POA: Diagnosis not present

## 2014-03-07 DIAGNOSIS — L259 Unspecified contact dermatitis, unspecified cause: Secondary | ICD-10-CM

## 2014-03-07 MED ORDER — HYDROCORTISONE 0.5 % EX CREA: 1.0000 "application " | TOPICAL_CREAM | Freq: Two times a day (BID) | CUTANEOUS | Status: DC

## 2014-03-07 NOTE — Progress Notes (Signed)
Patient ID: Cindy Oconnor, female   DOB: 23-Feb-1971, 43 y.o.   MRN: [id number]  Subjective: Patient returns the office they with a caregiver from her facility for followup evaluation for tinea pedis. Patient's caregiver states that they have noticed that she started to develop a rash on the outside aspects of her left foot and the dorsal part of the right foot. Made using Lotrisone cream as directed. Caregiver stated they have been applying a large amount of cream over the area during each application. Patient states that her feet no longer itch. No acute changes since last appointment. No other complaints at this time.  Objective: AAO x3, NAD DP/PT pulses palpable b/l. CRT < 3 sec Protective sensation intact with Simms Weinstein monofilament, vibratory sensation intact, Achilles tendon reflex intact. On the left foot there seems to be improvement off the lesions on the medial aspect of the foot. There are new areas of red splotchy areas on the lateral aspect of the foot. On the right foot there is evidence of some dried scaly skin on the dorsal aspect of the foot along the dorsal MTPJ again with new areas of red splotchy lesions. There continues to be small dry scaly skin on the lateral aspect of the foot. There does not appear to be any areas of ascending cellulitis, fluctuance, crepitus, increased warmth or other clinical signs of infection No open lesions.  No calf pain, swelling, warmth.  Assessment: 43 year old female with likely resolving tinea pedis however with new areas of red lesions on the lateral aspect of the left foot and dorsal right foot which may be a result of a dermatitis.  Plan: -Various treatment options discussed including alternatives, risks, complications. -At this time does continue Lotrisone cream. Will start hydrocortisone cream. -Monitor closely for any worsening of symptoms. Followup in 2 weeks or sooner if any problems are to arise or any changes symptoms. If  the symptoms persist or worsen we will refer to dermatology -Monitor for signs or symptoms of infection and directed to call the office immediately if any are to occur or go directly to the emergency room.

## 2014-03-13 ENCOUNTER — Other Ambulatory Visit: Payer: Self-pay | Admitting: Internal Medicine

## 2014-03-21 ENCOUNTER — Ambulatory Visit (INDEPENDENT_AMBULATORY_CARE_PROVIDER_SITE_OTHER): Payer: Medicare Other | Admitting: Podiatry

## 2014-03-21 ENCOUNTER — Telehealth: Payer: Self-pay | Admitting: *Deleted

## 2014-03-21 VITALS — BP 120/66 | HR 90 | Resp 16

## 2014-03-21 DIAGNOSIS — B353 Tinea pedis: Secondary | ICD-10-CM | POA: Diagnosis not present

## 2014-03-21 NOTE — Progress Notes (Signed)
Patient ID: Cindy Oconnor, female   DOB: July 03, 1970, 43 y.o.   MRN: [id number]  Subjective: Cindy Oconnor, 43 year old female, returns the office they with a caregiver from her facility for followup evaluation of rash on bilateral feet. Patient first presented to the office approximately 1.5 months ago for routine nail debridement was on a half a dry, scaling rash that was most likely consistent with tinea pedis on the medial aspect of the left foot. At that time the patient started on Lotrisone cream. Patient then followed up and there is noted to be new areas on the dorsal aspect of the right foot. There is also new areas on the lateral aspect of the left foot. She also developed small red areas around the rash after starting the Lotrisone cream. At that time the cream was discontinued it steroid cream was used. She presents the office today with continued rash on bilateral feet. His his caregiver stated that the patient typically uses multiple creams to her feet. Denies any systemic complaints such as fevers, chills, nausea, vomiting.  Objective: AAO x3, NAD DP/PT pulses palpable bilaterally, CRT less than 3 seconds Protective sensation intact with Simms Weinstein monofilament, vibratory sensation intact, Achilles tendon reflex intact On the left foot there is a dry, scaling, peeling rash on the lateral aspect with small areas of red what appears to be dried vesicular lesions. There is also some area on the medial aspect of the left foot although this appears to be improve compared to initial evaluation. There is also areas on the dorsal aspect of the right foot between the second and third interspaces. All areas appear unchanged from last appointment.  There is no ascending cellulitis, drainage, purulence.  No calf pain, swelling, warmth.   Assessment : 43 year old female with likely tinea pedis versus dermatitis   Plan : -Treatment options discussed including alternatives, risks,  complications.  -At this time as the area progress since initial evaluation and symptoms worsened after starting antifungal therapy we'll refer the patient to dermatology for further evaluation.  -Followup for routine nail/callus debridement.

## 2014-03-21 NOTE — Telephone Encounter (Signed)
Called and left message at [facility] letting them know appt is with dr Junita Push - graham at [facility] in [place] on nov 24 at 10:40.

## 2014-04-01 DIAGNOSIS — B353 Tinea pedis: Secondary | ICD-10-CM | POA: Diagnosis not present

## 2014-04-03 ENCOUNTER — Ambulatory Visit: Payer: Medicare Other | Admitting: Podiatry

## 2014-04-08 DIAGNOSIS — B353 Tinea pedis: Secondary | ICD-10-CM | POA: Diagnosis not present

## 2014-04-10 ENCOUNTER — Other Ambulatory Visit: Payer: Self-pay | Admitting: Internal Medicine

## 2014-04-10 ENCOUNTER — Ambulatory Visit: Payer: Medicare Other | Admitting: Podiatry

## 2014-04-10 NOTE — Telephone Encounter (Signed)
Electronic Rx request for prednisone 52m tablet received. Patient last office visit was 11/01/13 and medication last filled 03/13/14. Please advise

## 2014-04-11 ENCOUNTER — Ambulatory Visit (INDEPENDENT_AMBULATORY_CARE_PROVIDER_SITE_OTHER): Payer: Medicare Other | Admitting: *Deleted

## 2014-04-11 ENCOUNTER — Ambulatory Visit: Payer: Medicare Other

## 2014-04-11 ENCOUNTER — Other Ambulatory Visit: Payer: Self-pay | Admitting: *Deleted

## 2014-04-11 ENCOUNTER — Telehealth: Payer: Self-pay | Admitting: Internal Medicine

## 2014-04-11 DIAGNOSIS — Z23 Encounter for immunization: Secondary | ICD-10-CM | POA: Diagnosis not present

## 2014-04-11 MED ORDER — FOLIC ACID 1 MG PO TABS: 1.0000 mg | ORAL_TABLET | Freq: Every day | ORAL | Status: DC

## 2014-04-11 NOTE — Telephone Encounter (Signed)
Please see below request

## 2014-04-11 NOTE — Addendum Note (Signed)
Addended by: Wynonia Lawman E on: 04/11/2014 10:19 AM   Modules accepted: Orders

## 2014-04-11 NOTE — Telephone Encounter (Signed)
Pt needs a refill on Folic Acid 1 mg tab. Please advise pt.msn

## 2014-04-11 NOTE — Telephone Encounter (Signed)
Rx sent to pharmacy by escript

## 2014-04-16 ENCOUNTER — Ambulatory Visit (INDEPENDENT_AMBULATORY_CARE_PROVIDER_SITE_OTHER): Payer: Medicare Other | Admitting: Podiatry

## 2014-04-16 DIAGNOSIS — M79676 Pain in unspecified toe(s): Secondary | ICD-10-CM | POA: Diagnosis not present

## 2014-04-16 DIAGNOSIS — B351 Tinea unguium: Secondary | ICD-10-CM

## 2014-04-16 DIAGNOSIS — L84 Corns and callosities: Secondary | ICD-10-CM | POA: Diagnosis not present

## 2014-04-16 NOTE — Progress Notes (Signed)
Patient ID: Cindy Oconnor, female   DOB: 1971-01-24, 43 y.o.   MRN: [id number]  Subjective: 43 year old female returns the office today for complaints of painful elongated thickened toenails and painful calluses with a caregiver from her facility. Since last appointment she states that she has seen a dermatologist who told her shouldn't use infection on her skin and she was prescribed Lamisil 250 mg daily for 14 days. She states that since using the medication she has noticed improvement skin all of she continues of some dry skin for which she is using moisturizer for. Denies any acute changes since last appointment. No other complaints at this time.  Objective: AAO x3, NAD DP/PT pulses palpable bilaterally, CRT less than 3 seconds Protective sensation intact with Simms Weinstein monofilament, vibratory sensation intact, Achilles tendon reflex intact Nails hypertrophic, dystrophic, elongated, brittle, discolored 10. There is no surrounding erythema or drainage. Hyperkeratotic lesions on the right foot submetatarsal 1, plantar heel, distal second digit on the left foot plantar hallux, submetatarsal 1, plantar third digit. Upon debridement no open lesions identified. No surrounding erythema or drainage. Skin  Dry, pealing. There is no open lesions or clinical signs of infection. There is significant reduction and the coloration of the skin compared to prior appointment. No calf pain, swelling, warmth, erythema  Assessment: 43 year old female symptomatically onychomycosis, hyperkeratotic lesions  Plan: -Treatment options discussed including alternatives, risks, complications. -Nail sharply debrided 10 without complications. -Hyperkeratotic lesion sharply debrided x 6 without complications. -Continue moisturizer as directed by dermatology. -Follow-up in 60 days per the patient's caregiver. In the meantime, call the office in the questions, concerns, changes symptoms.

## 2014-05-07 DIAGNOSIS — M19042 Primary osteoarthritis, left hand: Secondary | ICD-10-CM | POA: Diagnosis not present

## 2014-05-07 DIAGNOSIS — M19041 Primary osteoarthritis, right hand: Secondary | ICD-10-CM | POA: Diagnosis not present

## 2014-05-07 DIAGNOSIS — M6530 Trigger finger, unspecified finger: Secondary | ICD-10-CM | POA: Diagnosis not present

## 2014-05-09 ENCOUNTER — Other Ambulatory Visit: Payer: Self-pay | Admitting: *Deleted

## 2014-05-09 MED ORDER — PREDNISONE 5 MG PO TABS: ORAL_TABLET | ORAL | Status: DC

## 2014-05-09 NOTE — Telephone Encounter (Signed)
Last visit 11/01/13. Appointment scheduled 05/16/14.

## 2014-05-10 ENCOUNTER — Other Ambulatory Visit: Payer: Self-pay | Admitting: Internal Medicine

## 2014-05-10 NOTE — Telephone Encounter (Signed)
Okay to refill? Pt has history of no show and cancellations. Please advise.

## 2014-05-16 ENCOUNTER — Encounter: Payer: Self-pay | Admitting: Internal Medicine

## 2014-05-16 ENCOUNTER — Ambulatory Visit (INDEPENDENT_AMBULATORY_CARE_PROVIDER_SITE_OTHER): Payer: Medicare Other | Admitting: Internal Medicine

## 2014-05-16 VITALS — BP 106/70 | HR 69 | Temp 98.1°F | Ht <= 58 in | Wt 165.8 lb

## 2014-05-16 DIAGNOSIS — M069 Rheumatoid arthritis, unspecified: Secondary | ICD-10-CM

## 2014-05-16 DIAGNOSIS — E669 Obesity, unspecified: Secondary | ICD-10-CM | POA: Diagnosis not present

## 2014-05-16 DIAGNOSIS — E039 Hypothyroidism, unspecified: Secondary | ICD-10-CM | POA: Diagnosis not present

## 2014-05-16 DIAGNOSIS — Q909 Down syndrome, unspecified: Secondary | ICD-10-CM

## 2014-05-16 DIAGNOSIS — Z0000 Encounter for general adult medical examination without abnormal findings: Secondary | ICD-10-CM | POA: Diagnosis not present

## 2014-05-16 LAB — LIPID PANEL
CHOLESTEROL: 178 mg/dL (ref 0–200)
HDL: 47.1 mg/dL (ref >39.00)
LDL Cholesterol: 111 mg/dL — ABNORMAL HIGH (ref 0–99)
NONHDL: 130.9
TRIGLYCERIDES: 102 mg/dL (ref 0.0–149.0)
Total CHOL/HDL Ratio: 4
VLDL: 20.4 mg/dL (ref 0.0–40.0)

## 2014-05-16 LAB — CBC WITH DIFFERENTIAL/PLATELET
Basophils Absolute: 0 10*3/uL (ref 0.0–0.1)
Basophils Relative: 0.2 % (ref 0.0–3.0)
Eosinophils Absolute: 0 10*3/uL (ref 0.0–0.7)
Eosinophils Relative: 0.2 % (ref 0.0–5.0)
HEMATOCRIT: 42 % (ref 36.0–46.0)
HEMOGLOBIN: 13.8 g/dL (ref 12.0–15.0)
LYMPHS ABS: 1.3 10*3/uL (ref 0.7–4.0)
Lymphocytes Relative: 9.8 % — ABNORMAL LOW (ref 12.0–46.0)
MCHC: 32.9 g/dL (ref 30.0–36.0)
MCV: 106.8 fl — ABNORMAL HIGH (ref 78.0–100.0)
Monocytes Absolute: 0.6 10*3/uL (ref 0.1–1.0)
Monocytes Relative: 4.1 % (ref 3.0–12.0)
NEUTROS ABS: 11.6 10*3/uL — AB (ref 1.4–7.7)
Neutrophils Relative %: 85.7 % — ABNORMAL HIGH (ref 43.0–77.0)
Platelets: 299 10*3/uL (ref 150.0–400.0)
RBC: 3.93 Mil/uL (ref 3.87–5.11)
RDW: 16.4 % — AB (ref 11.5–15.5)
WBC: 13.5 10*3/uL — ABNORMAL HIGH (ref 4.0–10.5)

## 2014-05-16 LAB — HM PAP SMEAR

## 2014-05-16 LAB — COMPREHENSIVE METABOLIC PANEL
ALT: 30 U/L (ref 0–35)
AST: 25 U/L (ref 0–37)
Albumin: 4 g/dL (ref 3.5–5.2)
Alkaline Phosphatase: 64 U/L (ref 39–117)
BILIRUBIN TOTAL: 0.6 mg/dL (ref 0.2–1.2)
BUN: 17 mg/dL (ref 6–23)
CHLORIDE: 108 meq/L (ref 96–112)
CO2: 26 mEq/L (ref 19–32)
CREATININE: 1 mg/dL (ref 0.4–1.2)
Calcium: 9.1 mg/dL (ref 8.4–10.5)
GFR: 62.79 mL/min (ref >60.00)
GLUCOSE: 89 mg/dL (ref 70–99)
Potassium: 4.1 mEq/L (ref 3.5–5.1)
Sodium: 144 mEq/L (ref 135–145)
Total Protein: 7.2 g/dL (ref 6.0–8.3)

## 2014-05-16 NOTE — Assessment & Plan Note (Signed)
Reviewed notes from [facility]. Will continue Methotrexate and Prednisone. Will check CBC with labs today.

## 2014-05-16 NOTE — Progress Notes (Signed)
Subjective:    Patient ID: Cindy Oconnor, female    DOB: 11-01-1970, 43 y.o.   MRN: [id number]  HPI 43YO female with Down Syndrome, Ulcerative Colitis and RA presents for physical exam.  RA - evaluated at [facility]. Continued on Methotrexate and Prednisone. Diagnosed with seronegative RA and OA.  No new concerns by caregivers or patient.   Past medical, surgical, family and social history per today's encounter.  Review of Systems  Constitutional: Negative for fever, chills, appetite change, fatigue and unexpected weight change.  Eyes: Negative for visual disturbance.  Respiratory: Negative for shortness of breath.   Cardiovascular: Negative for chest pain and leg swelling.  Gastrointestinal: Negative for nausea, vomiting, abdominal pain, diarrhea, constipation and blood in stool.  Musculoskeletal: Negative for myalgias and arthralgias.  Skin: Negative for color change and rash.  Hematological: Negative for adenopathy. Does not bruise/bleed easily.  Psychiatric/Behavioral: Negative for sleep disturbance and dysphoric mood. The patient is not nervous/anxious.        Objective:    BP 106/70 mmHg  Pulse 69  Temp(Src) 98.1 F (36.7 C) (Oral)  Ht 4' 8.5" (1.435 m)  Wt 165 lb 12 oz (75.184 kg)  BMI 36.51 kg/m2  SpO2 95%  LMP 04/11/1981 Physical Exam  Constitutional: She is oriented to person, place, and time. She appears well-developed and well-nourished. No distress.  HENT:  Head: Normocephalic and atraumatic.  Right Ear: External ear normal.  Left Ear: External ear normal.  Nose: Nose normal.  Mouth/Throat: Oropharynx is clear and moist. No oropharyngeal exudate.  Eyes: Conjunctivae are normal. Pupils are equal, round, and reactive to light. Right eye exhibits no discharge. Left eye exhibits no discharge. No scleral icterus.  Neck: Normal range of motion. Neck supple. No tracheal deviation present. No thyromegaly present.  Cardiovascular: Normal rate, regular rhythm,  normal heart sounds and intact distal pulses.  Exam reveals no gallop and no friction rub.   No murmur heard. Pulmonary/Chest: Effort normal and breath sounds normal. No accessory muscle usage. No tachypnea. No respiratory distress. She has no decreased breath sounds. She has no wheezes. She has no rales. She exhibits no tenderness. Right breast exhibits no inverted nipple, no mass, no nipple discharge, no skin change and no tenderness. Left breast exhibits no inverted nipple, no mass, no nipple discharge, no skin change and no tenderness. Breasts are symmetrical.  Abdominal: Soft. Bowel sounds are normal. She exhibits no distension and no mass. There is no tenderness. There is no rebound and no guarding.  Musculoskeletal: Normal range of motion. She exhibits no edema or tenderness.  Lymphadenopathy:    She has no cervical adenopathy.  Neurological: She is alert and oriented to person, place, and time. No cranial nerve deficit. She exhibits normal muscle tone. Coordination normal.  Skin: Skin is warm and dry. No rash noted. She is not diaphoretic. No erythema. No pallor.  Psychiatric: She has a normal mood and affect. Her speech is normal and behavior is normal. Thought content normal. Cognition and memory are impaired. She expresses impulsivity.          Assessment & Plan:   Problem List Items Addressed This Visit      Unprioritized   DOWN'S SYNDROME   Obesity (BMI 30-39.9)    Wt Readings from Last 3 Encounters:  05/16/14 165 lb 12 oz (75.184 kg)  11/01/13 162 lb 8 oz (73.71 kg)  05/30/13 170 lb (77.111 kg)   Body mass index is 36.51 kg/(m^2). Encouraged healthy diet and  exercise.    Rheumatoid arthritis    Reviewed notes from [facility]. Will continue Methotrexate and Prednisone. Will check CBC with labs today.    Routine general medical examination at a health care facility - Primary    General medical exam including breast exam normal today. PAP and pelvic deferred as pt s/p  hysterectomy. Immunizations are UTD. Labs today CBC, CMP, lipids. Reviewed notes and labs from [facility]. Encouraged healthy diet and exercise.    Relevant Orders      CBC with Differential      Comprehensive metabolic panel      Lipid panel       No Follow-up on file.

## 2014-05-16 NOTE — Assessment & Plan Note (Signed)
Wt Readings from Last 3 Encounters:  05/16/14 165 lb 12 oz (75.184 kg)  11/01/13 162 lb 8 oz (73.71 kg)  05/30/13 170 lb (77.111 kg)   Body mass index is 36.51 kg/(m^2). Encouraged healthy diet and exercise.

## 2014-05-16 NOTE — Assessment & Plan Note (Signed)
General medical exam including breast exam normal today. PAP and pelvic deferred as pt s/p hysterectomy. Immunizations are UTD. Labs today CBC, CMP, lipids. Reviewed notes and labs from [facility]. Encouraged healthy diet and exercise.

## 2014-05-16 NOTE — Progress Notes (Signed)
Pre visit review using our clinic review tool, if applicable. No additional management support is needed unless otherwise documented below in the visit note. 

## 2014-05-16 NOTE — Patient Instructions (Signed)

## 2014-06-10 DIAGNOSIS — B353 Tinea pedis: Secondary | ICD-10-CM | POA: Diagnosis not present

## 2014-06-11 ENCOUNTER — Other Ambulatory Visit: Payer: Self-pay | Admitting: *Deleted

## 2014-06-11 MED ORDER — CALCIUM CARBONATE 1250 (500 CA) MG PO CAPS: 1250.0000 mg | ORAL_CAPSULE | Freq: Two times a day (BID) | ORAL | Status: DC

## 2014-06-11 MED ORDER — VITAMIN D3 25 MCG (1000 UT) PO CAPS: 1000.0000 [IU] | ORAL_CAPSULE | Freq: Every day | ORAL | Status: DC

## 2014-06-12 ENCOUNTER — Other Ambulatory Visit: Payer: Self-pay | Admitting: *Deleted

## 2014-06-12 MED ORDER — CALCIUM CARBONATE 1250 (500 CA) MG PO CAPS: 1250.0000 mg | ORAL_CAPSULE | Freq: Every day | ORAL | Status: DC

## 2014-06-14 ENCOUNTER — Other Ambulatory Visit: Payer: Self-pay | Admitting: Internal Medicine

## 2014-06-18 ENCOUNTER — Ambulatory Visit: Payer: Medicare Other | Admitting: Podiatry

## 2014-06-19 ENCOUNTER — Other Ambulatory Visit: Payer: Self-pay | Admitting: Internal Medicine

## 2014-06-19 NOTE — Telephone Encounter (Signed)
Last refill 12.17.15.  Please advise refill

## 2014-07-03 DIAGNOSIS — M25569 Pain in unspecified knee: Secondary | ICD-10-CM | POA: Diagnosis not present

## 2014-07-03 DIAGNOSIS — M329 Systemic lupus erythematosus, unspecified: Secondary | ICD-10-CM | POA: Diagnosis not present

## 2014-07-03 DIAGNOSIS — K51912 Ulcerative colitis, unspecified with intestinal obstruction: Secondary | ICD-10-CM | POA: Diagnosis not present

## 2014-07-03 DIAGNOSIS — Z86718 Personal history of other venous thrombosis and embolism: Secondary | ICD-10-CM | POA: Diagnosis not present

## 2014-07-03 DIAGNOSIS — R768 Other specified abnormal immunological findings in serum: Secondary | ICD-10-CM | POA: Diagnosis not present

## 2014-07-03 DIAGNOSIS — M810 Age-related osteoporosis without current pathological fracture: Secondary | ICD-10-CM | POA: Diagnosis not present

## 2014-07-03 DIAGNOSIS — Q909 Down syndrome, unspecified: Secondary | ICD-10-CM | POA: Diagnosis not present

## 2014-07-03 DIAGNOSIS — M1991 Primary osteoarthritis, unspecified site: Secondary | ICD-10-CM | POA: Diagnosis not present

## 2014-07-03 DIAGNOSIS — M1990 Unspecified osteoarthritis, unspecified site: Secondary | ICD-10-CM | POA: Diagnosis not present

## 2014-07-03 DIAGNOSIS — M6530 Trigger finger, unspecified finger: Secondary | ICD-10-CM | POA: Diagnosis not present

## 2014-07-03 DIAGNOSIS — J45909 Unspecified asthma, uncomplicated: Secondary | ICD-10-CM | POA: Diagnosis not present

## 2014-07-03 DIAGNOSIS — E039 Hypothyroidism, unspecified: Secondary | ICD-10-CM | POA: Diagnosis not present

## 2014-07-03 DIAGNOSIS — Z7952 Long term (current) use of systemic steroids: Secondary | ICD-10-CM | POA: Diagnosis not present

## 2014-07-03 DIAGNOSIS — M069 Rheumatoid arthritis, unspecified: Secondary | ICD-10-CM | POA: Diagnosis not present

## 2014-07-04 ENCOUNTER — Ambulatory Visit (INDEPENDENT_AMBULATORY_CARE_PROVIDER_SITE_OTHER): Payer: Medicare Other | Admitting: Podiatry

## 2014-07-04 ENCOUNTER — Encounter: Payer: Self-pay | Admitting: Podiatry

## 2014-07-04 VITALS — BP 106/64 | HR 75 | Resp 18

## 2014-07-04 DIAGNOSIS — M79676 Pain in unspecified toe(s): Secondary | ICD-10-CM | POA: Diagnosis not present

## 2014-07-04 DIAGNOSIS — L84 Corns and callosities: Secondary | ICD-10-CM | POA: Diagnosis not present

## 2014-07-04 DIAGNOSIS — B351 Tinea unguium: Secondary | ICD-10-CM

## 2014-07-04 NOTE — Progress Notes (Signed)
Patient ID: Cindy Oconnor, female   DOB: 1970/06/29, 44 y.o.   MRN: [id number]  Subjective: 44 year old female returns the office today with complaints of elongated thickened toenails as well as calluses to both of her feet. She states of the areas are painful. Denies any recent redness or drainage from the nail sites. Denies any recent ulcerations. No other complaints at this time.  She is accompanied today by caregiver from her facility   Objective: AAO 3, NAD  DP/PT pulses palpable bilaterally, CRT less than 3 seconds  Protective sensation intact with Simms Weinstein monofilament, vibratory sensation intact, Achilles tendon reflex intact.  Nails are hypertrophic, dystrophic, elongated, brittle, discolored 10. There subjective tenderness overall 10 toenails. No surrounding erythema or drainage from the nail sites.  Hyperkeratotic lesions right foot submetatarsal 1, plantar heel, lateral fifth digit ; left foot distal third digit, submetatarsal 1. Upon debridement a lesion no underlying ulceration and no clinical signs of infection.  No other areas of tenderness to bilateral lower extremities and no overlying edema, erythema, increase in warmth.  No pain with calf compression, swelling, warmth, erythema.   Assessment : 44 year old female with symptomatic onychomycosis, hyperkeratotic lesions   Plan: -Treatment options discussed including alternatives, risks, complications. -Nail sharply debrided 10 without complication/bleeding -Hyperkeratotic lesion sharply debrided without complication/bleeding -Discussed the importance of daily foot inspection -Follow-up in 3 months or sooner if any problems are to arise. In the meantime encouraged to call the office with any questions, concerns, change in symptoms.

## 2014-07-05 ENCOUNTER — Other Ambulatory Visit: Payer: Self-pay | Admitting: Internal Medicine

## 2014-07-08 ENCOUNTER — Other Ambulatory Visit: Payer: Self-pay | Admitting: Internal Medicine

## 2014-07-09 DIAGNOSIS — M6530 Trigger finger, unspecified finger: Secondary | ICD-10-CM | POA: Insufficient documentation

## 2014-07-09 DIAGNOSIS — M65342 Trigger finger, left ring finger: Secondary | ICD-10-CM | POA: Diagnosis not present

## 2014-07-09 DIAGNOSIS — R52 Pain, unspecified: Secondary | ICD-10-CM | POA: Diagnosis not present

## 2014-07-12 ENCOUNTER — Other Ambulatory Visit: Payer: Self-pay | Admitting: *Deleted

## 2014-07-12 MED ORDER — OMEPRAZOLE 40 MG PO CPDR: DELAYED_RELEASE_CAPSULE | ORAL | Status: DC

## 2014-07-12 MED ORDER — SENNOSIDES 8.6 MG PO TABS: 1.0000 | ORAL_TABLET | Freq: Every day | ORAL | Status: DC

## 2014-07-12 MED ORDER — THEREMS PO TABS: ORAL_TABLET | ORAL | Status: DC

## 2014-07-12 MED ORDER — DOCUSATE SODIUM 100 MG PO CAPS: 100.0000 mg | ORAL_CAPSULE | ORAL | Status: DC

## 2014-07-12 MED ORDER — LEVOTHYROXINE SODIUM 100 MCG PO TABS: ORAL_TABLET | ORAL | Status: DC

## 2014-07-16 ENCOUNTER — Other Ambulatory Visit: Payer: Self-pay | Admitting: Internal Medicine

## 2014-07-17 ENCOUNTER — Telehealth: Payer: Self-pay | Admitting: *Deleted

## 2014-07-17 ENCOUNTER — Encounter: Payer: Self-pay | Admitting: Nurse Practitioner

## 2014-07-17 ENCOUNTER — Ambulatory Visit (INDEPENDENT_AMBULATORY_CARE_PROVIDER_SITE_OTHER): Payer: Medicare Other | Admitting: Nurse Practitioner

## 2014-07-17 ENCOUNTER — Other Ambulatory Visit: Payer: Self-pay | Admitting: *Deleted

## 2014-07-17 VITALS — BP 106/78 | HR 64 | Temp 97.8°F | Resp 16 | Ht <= 58 in | Wt 169.8 lb

## 2014-07-17 DIAGNOSIS — H6123 Impacted cerumen, bilateral: Secondary | ICD-10-CM | POA: Diagnosis not present

## 2014-07-17 DIAGNOSIS — J45909 Unspecified asthma, uncomplicated: Secondary | ICD-10-CM

## 2014-07-17 DIAGNOSIS — M1711 Unilateral primary osteoarthritis, right knee: Secondary | ICD-10-CM | POA: Diagnosis not present

## 2014-07-17 MED ORDER — TIOTROPIUM BROMIDE MONOHYDRATE 18 MCG IN CAPS: ORAL_CAPSULE | RESPIRATORY_TRACT | Status: DC

## 2014-07-17 MED ORDER — CARBAMIDE PEROXIDE 6.5 % OT SOLN: 5.0000 [drp] | Freq: Two times a day (BID) | OTIC | Status: DC

## 2014-07-17 NOTE — Patient Instructions (Signed)
Keep on the Spiriva until it runs out on Friday.   We will work on the Prior Authorization for Spiriva and let you know the outcome.

## 2014-07-17 NOTE — Telephone Encounter (Signed)
Prior authorization completed for Spiriva using [email or web address]

## 2014-07-17 NOTE — Progress Notes (Signed)
Pre visit review using our clinic review tool, if applicable. No additional management support is needed unless otherwise documented below in the visit note. 

## 2014-07-17 NOTE — Progress Notes (Signed)
   Subjective:    Patient ID: Cindy Oconnor, female    DOB: 12/09/1970, 44 y.o.   MRN: [id number]  HPI  Cindy Oconnor is a 44 yo female with a CC of inhaler issues. She is down syndrome and presents today with her care taker from the group home. She is the primary historian.   1) Pt care taker reports that she will be running out of Spiriva inhaler in 2 days. One of the insurance entities will no longer cover this and the list of alternatives given to Dr. Gilford Rile were in different classes and not of benefit to the patient in the same manner as Spiriva. CMA for Dr. Gilford Rile is working currently on a Prior Authorization. Pt is well controlled on current regimen and has had no recent exacerbations.   2) Caregiver brought up that her mother mentioned patient has ear wax build up often and that her decrease in hearing may be due to this. She would like her ears looked at today.   Review of Systems  Constitutional: Negative for fever, chills, diaphoresis and fatigue.  HENT: Positive for hearing loss.        Decreased ability to hear  Respiratory: Negative for chest tightness, shortness of breath and wheezing.   Cardiovascular: Negative for chest pain, palpitations and leg swelling.  Gastrointestinal: Negative for nausea, vomiting and diarrhea.  Skin: Negative for rash.  Neurological: Negative for dizziness, weakness, numbness and headaches.  Psychiatric/Behavioral: The patient is not nervous/anxious.        Objective:   Physical Exam  Constitutional: She appears well-developed and well-nourished. No distress.  BP 106/78 mmHg  Pulse 64  Temp(Src) 97.8 F (36.6 C) (Oral)  Resp 16  Ht 4' 8.5" (1.435 m)  Wt 169 lb 12.8 oz (77.021 kg)  BMI 37.40 kg/m2  SpO2 95%  LMP 04/11/1981   HENT:  Head: Normocephalic and atraumatic.  Down syndrome characteristics.  TM view obstructed bilaterally by cerumen  Eyes: Right eye exhibits no discharge. Left eye exhibits no discharge. No scleral  icterus.  Glasses  Cardiovascular: Normal rate, regular rhythm and normal heart sounds.  Exam reveals no gallop and no friction rub.   No murmur heard. Pulmonary/Chest: Effort normal and breath sounds normal. No respiratory distress. She has no wheezes. She has no rales. She exhibits no tenderness.  Neurological: She is alert.  Skin: Skin is warm and dry. No rash noted. She is not diaphoretic.         Assessment & Plan:

## 2014-07-20 DIAGNOSIS — H6123 Impacted cerumen, bilateral: Secondary | ICD-10-CM | POA: Insufficient documentation

## 2014-07-20 NOTE — Assessment & Plan Note (Signed)
Asked caregiver to try Debrox in both ears as directed on package. OTC medication. Asked her to return to ENT if needs further intervention (has done this previously).

## 2014-07-20 NOTE — Assessment & Plan Note (Signed)
Currently well controlled on Spiriva, symbicort, xopenex and working today on a PA for Spiriva. (update- it was approved). Pt to continue this.

## 2014-08-05 ENCOUNTER — Telehealth: Payer: Self-pay | Admitting: *Deleted

## 2014-08-05 NOTE — Telephone Encounter (Signed)
Fax from [facility], needing PA for Omeprazole. Started online, given to Dr. Gilford Rile for completion and signature

## 2014-08-06 ENCOUNTER — Other Ambulatory Visit: Payer: Self-pay | Admitting: *Deleted

## 2014-08-06 MED ORDER — OMEPRAZOLE 20 MG PO CPDR: 20.0000 mg | DELAYED_RELEASE_CAPSULE | Freq: Two times a day (BID) | ORAL | Status: DC

## 2014-08-06 NOTE — Telephone Encounter (Signed)
Fax from [facility], Omeprazole denied. Denial placed in Dr. Thomes Dinning box for review.

## 2014-08-06 NOTE — Telephone Encounter (Signed)
Had message on vm from [facility] stating that omeprazole (PRILOSEC) 40 MG capsule has been declined but omeprazole (PRILOSEC) 20 MG capsule is allowed without a quantity limit.  Okay to send Rx for omeprazole (PRILOSEC) 20 MG capsule BID?

## 2014-08-06 NOTE — Telephone Encounter (Signed)
Yes, fine to send Omeprazole 67m po bid.

## 2014-08-07 ENCOUNTER — Telehealth: Payer: Self-pay

## 2014-08-07 NOTE — Telephone Encounter (Signed)
The patient's caregiver called and is hoping to get a referral to [facility] - Dr.Bennett for ongoing ear problems.  Callback - [phone]

## 2014-08-07 NOTE — Telephone Encounter (Signed)
Needs to be seen

## 2014-08-07 NOTE — Telephone Encounter (Signed)
Per Lorane Gell, pt has been seen with this problem and she will send referral for ENT, Advised pt's caregiver Jacqlyn Larsen)

## 2014-08-08 ENCOUNTER — Other Ambulatory Visit: Payer: Self-pay | Admitting: Nurse Practitioner

## 2014-08-08 DIAGNOSIS — H6123 Impacted cerumen, bilateral: Secondary | ICD-10-CM

## 2014-08-08 NOTE — Telephone Encounter (Signed)
Done

## 2014-08-14 ENCOUNTER — Ambulatory Visit (INDEPENDENT_AMBULATORY_CARE_PROVIDER_SITE_OTHER): Payer: Medicare Other | Admitting: *Deleted

## 2014-08-14 DIAGNOSIS — H6123 Impacted cerumen, bilateral: Secondary | ICD-10-CM

## 2014-08-14 NOTE — Progress Notes (Signed)
Pt presents for ear irrigation for bilateral cerumen impaction. Has been using Debrox drops at home. Bilateral ear irrigation with water, tolerated without difficulty. Able to visualize bilateral tympanic membranes after procedure.

## 2014-08-14 NOTE — Progress Notes (Signed)
Thanks

## 2014-08-15 ENCOUNTER — Other Ambulatory Visit: Payer: Medicare Other

## 2014-08-22 ENCOUNTER — Encounter: Payer: Self-pay | Admitting: Nurse Practitioner

## 2014-08-22 ENCOUNTER — Telehealth: Payer: Self-pay | Admitting: *Deleted

## 2014-08-22 ENCOUNTER — Ambulatory Visit (INDEPENDENT_AMBULATORY_CARE_PROVIDER_SITE_OTHER): Payer: Medicare Other | Admitting: Nurse Practitioner

## 2014-08-22 VITALS — BP 114/72 | HR 81 | Resp 14 | Ht <= 58 in | Wt 173.2 lb

## 2014-08-22 DIAGNOSIS — Q909 Down syndrome, unspecified: Secondary | ICD-10-CM | POA: Diagnosis not present

## 2014-08-22 MED ORDER — CARBAMIDE PEROXIDE 6.5 % OT SOLN: OTIC | Status: DC

## 2014-08-22 NOTE — Telephone Encounter (Signed)
Form completed and ready for pick up

## 2014-08-22 NOTE — Patient Instructions (Signed)
Good luck next month!

## 2014-08-22 NOTE — Progress Notes (Signed)
Pre visit review using our clinic review tool, if applicable. No additional management support is needed unless otherwise documented below in the visit note. 

## 2014-08-22 NOTE — Assessment & Plan Note (Signed)
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 Echo 2014 with normal cardiac evaluation. Checked gross vision, hearing, which were normal. Examined and filled out proper systems listed for evaluation on the form.

## 2014-08-22 NOTE — Telephone Encounter (Signed)
Mother dropped off form to be completed for camp. Please call her when ready for pickup. [phone]. Placed in Dr. Thomes Dinning red folder.

## 2014-08-22 NOTE — Progress Notes (Signed)
   Subjective:    Patient ID: Cindy Oconnor, female    DOB: Sep 16, 1970, 44 y.o.   MRN: [id number]  HPI  Cindy Oconnor is a 44 yo female with a  CC of form completion for [employer].   1) She will be participating in the tennis ball throw and a walking event. 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   No need for x-ray of neck to check for atlanto-axial instability due to participation in non-impact events.   Going for vision exam next week with Cindy Oconnor.   Review of Systems  Constitutional: Negative for fever, chills, diaphoresis and fatigue.  Eyes: Negative for visual disturbance.  Respiratory: Negative for chest tightness, shortness of breath and wheezing.   Cardiovascular: Negative for chest pain, palpitations and leg swelling.  Gastrointestinal: Negative for nausea, vomiting and diarrhea.  Skin: Negative for rash.  Neurological: Negative for dizziness, weakness, numbness and headaches.  Hematological: Does not bruise/bleed easily.  Psychiatric/Behavioral: The patient is not nervous/anxious.       Objective:   Physical Exam  Constitutional: She is oriented to person, place, and time. She appears well-developed and well-nourished. No distress.  BP 114/72 mmHg  Pulse 81  Resp 14  Ht 4' 8.5" (1.435 m)  Wt 173 lb 4 oz (78.586 kg)  BMI 38.16 kg/m2  SpO2 96%  LMP 04/11/1981   HENT:  Head: Normocephalic and atraumatic.  Right Ear: External ear normal.  Left Ear: External ear normal.  Eyes: Right eye exhibits no discharge. Left eye exhibits no discharge. No scleral icterus.  Neck: Normal range of motion. Neck supple.  Cardiovascular: Normal rate and regular rhythm.  Exam reveals no gallop and no friction rub.   Murmur heard. ECHO 2014  Pulmonary/Chest: Effort normal and breath sounds normal. No respiratory distress. She has no wheezes. She has no rales. She exhibits no  tenderness.  Lungs clear on exam today  Abdominal: Soft. Bowel sounds are normal.  Genitourinary:  Deferred  Musculoskeletal: Normal range of motion. She exhibits no edema or tenderness.  Neurological: She is alert and oriented to person, place, and time. She displays normal reflexes. No cranial nerve deficit. She exhibits normal muscle tone. Coordination normal.  Skin: Skin is warm and dry. No rash noted. She is not diaphoretic.  Psychiatric: She has a normal mood and affect. Her behavior is normal. Judgment and thought content normal.      Assessment & Plan:

## 2014-08-22 NOTE — Telephone Encounter (Signed)
Needs old order of Debrox d/c'd and needs new order to be more specific than just prn. Rx sent to pharmacy by escript. Per CArrie send as monthly prn

## 2014-09-05 ENCOUNTER — Ambulatory Visit (INDEPENDENT_AMBULATORY_CARE_PROVIDER_SITE_OTHER): Payer: Medicare Other | Admitting: Podiatry

## 2014-09-05 DIAGNOSIS — L84 Corns and callosities: Secondary | ICD-10-CM | POA: Diagnosis not present

## 2014-09-05 DIAGNOSIS — B351 Tinea unguium: Secondary | ICD-10-CM

## 2014-09-05 DIAGNOSIS — M79676 Pain in unspecified toe(s): Secondary | ICD-10-CM

## 2014-09-05 NOTE — Progress Notes (Signed)
Patient ID: Cindy Oconnor, female   DOB: 02/25/1971, 44 y.o.   MRN: [id number]  Subjective: Ms. Phillipsreturns the office today with complaints of elongated thickened toenails as well as calluses to both of her feet. She states of the areas are painful. Denies any recent redness or drainage from the nail sites/calluses. Denies any recent ulcerations. No other complaints at this time.  She is accompanied today by caregiver from her facility   Objective: AAO 3, NAD  DP/PT pulses palpable bilaterally, CRT less than 3 seconds  Protective sensation intact with Simms Weinstein monofilament, vibratory sensation intact, Achilles tendon reflex intact.  Nails are hypertrophic, dystrophic, elongated, brittle, discolored 10. There subjective tenderness over nails 1-5 bilaterally. No surrounding erythema or drainage from the nail sites.  Hyperkeratotic lesions bilateral submetatarsal 1, right plantar heel, right lateral fifth digit  left foot distal third digit. Upon debridement a lesion no underlying ulceration and no clinical signs of infection.  No other areas of tenderness to bilateral lower extremities and no overlying edema, erythema, increase in warmth.  No pain with calf compression, swelling, warmth, erythema.   Assessment : 44 year old female with symptomatic onychomycosis, hyperkeratotic lesions   Plan: -Treatment options discussed including alternatives, risks, complications. -Nail sharply debrided 10 without complication/bleeding -Hyperkeratotic lesion sharply debrided without complication/bleeding -Discussed the importance of daily foot inspection -Follow-up in 3 months or sooner if any problems are to arise. In the meantime encouraged to call the office with any questions, concerns, change in symptoms.

## 2014-09-09 DIAGNOSIS — H5000 Unspecified esotropia: Secondary | ICD-10-CM | POA: Diagnosis not present

## 2014-09-21 NOTE — H&P (Signed)
PATIENT NAME:  Cindy Oconnor, Cindy Oconnor MR#:  [id number] DATE OF BIRTH:  Jul 26, 1970  DATE OF ADMISSION:  06/27/2013  PRIMARY CARE PHYSICIAN: Dr. Ronette Deter.    HISTORY OF PRESENT ILLNESS:  The patient is a patient is a 44 year old Caucasian female with history of Down's syndrome, also history of ulcerative colitis who presents to the hospital from group home with nausea and vomiting.  Apparently, the patient was in the group home. She was doing well yesterday. She went to work and apparently she came back and was able to eat dinner at around 5:00 p.m. Approximately 1 hour later after she ate dinner, she started having nausea, vomiting, as well as abdominal pain. She was having intermittent nausea and vomiting all night long yesterday. She was complaining of some epigastric abdominal pain. On arrival to the Emergency Room, she was complaining of lower abdominal pain on palpation. There was no report of bowel movement since the day before yesterday; however, no nausea, vomiting, as well as bowel movements are reported by work staff yesterday, and the patient herself told current staff in group home that she was having some nausea and vomiting during work as well.  Now, she is not able to keep even liquids orally so she was brought to the Emergency Room for further evaluation and hospitalist services were contacted for admission. CT scan, which was done in the Emergency Room, revealed a questionable small bowel obstruction and hospitalist services were contacted.   PAST MEDICAL HISTORY:  Significant for multiple medical problems. The patient has history of ulcerative colitis, history of colonoscopy in November 2014 by Dr. Rayann Heman. Reportedly the examined portion of the ileum was normal.  The entire colon was normal, except for mildly decreased vascularity in the left colon. Biopsies done from multiple sites were unremarkable. History of asthma exacerbation in July 2013, history of deep vein thrombosis, not on  Coumadin therapy, rheumatoid arthritis, hypothyroidism, Down's syndrome with no known history of congestive heart disease. Abnormal EKG with left bundle branch block in the past and recent EKG done on this admission has intraventricular conduction delay, history of admission for gastroenteritis and hypertension in August 2012, microcytic anemia, inflammatory bowel disease, ulcerative colitis, obesity, gastroesophageal reflux disease, osteoporosis, history of right foot fracture, osteoarthritis.   PAST SURGICAL HISTORY:  Hysterectomy as well as bunion surgery x 2.   ALLERGIES: No known drug allergies.   MEDICATIONS: According to medical records, the patient is on Apriso 375 mg capsule once a day, benzonatate 100 mg p.o. 3 times daily as needed, calcium carbonate 1 tablet daily, Eucerin topical cream to affected area twice daily, folic acid 1 mg p.o. daily, Fosamax 70 mg p.o. weekly, gentamicin topical ointment 0.1% to affected area twice daily as needed, guaifenesin 400 mg p.o. every 4 hours as needed, lactulose 45 mg once daily as needed, levothyroxine 100 mcg p.o. daily, methotrexate at 2.5 mg 7 tablets once daily, multivitamins once daily, omeprazole 40 mg p.o. daily, prednisone 5 mg p.o. daily, Spiriva 18 mcg inhalation daily, Symbicort 160/4.5 mg, 2 puffs twice daily, Tylenol with Codeine #3, 300/30, one tablet every 4 to 6 hours as needed. Vitamin D3 1000 units once daily, Xopenex 0.63 mg in 3 mL inhalation solution, 3 times daily as needed, Zofran ODT 4 mg every 8 hours as needed.   FAMILY HISTORY: The patient's mother has congenital disease with pulmonary stenosis. Multiple surgeries related to that. The patient's uncle has a history of Down's syndrome, as well as aunt.   SOCIAL  HISTORY: The patient resides [facility]. She goes to work. No tobacco, alcohol or drug abuse.   REVIEW OF SYSTEMS:  Not available as the patient is very somnolent and not able to give me much  history.  PHYSICAL EXAMINATION: VITAL SIGNS:  On arrival to the Emergency Room, the patient's temperature was 97.6, pulse is 91, respiration was 20, blood pressure 156/73 however recently, her blood pressure, went down to 24M systolic.  O2 sats was 94% on room air.  GENERAL: This is a well-developed, well-nourished, pale Caucasian female in no significant distress, lying on the stretcher. She has typical Down syndrome facial features.  HEENT: Her pupils are equal and reactive. Extraocular movements intact. No icterus or conjunctivitis. She has normal hearing. No pharyngeal erythema. Mucosa is dry.  NECK: No masses. Supple, nontender. Thyroid is not enlarged. No adenopathy. No JVD or carotid bruits bilaterally.  Full range of motion.  LUNGS: Clear to auscultation with diminished breath sounds. Poor effort. No rales or rhonchi. No wheezing. No labored inspirations. No increased effort or dullness to  percussion. Not in overt respiratory distress.  CARDIOVASCULAR: S1, S2 appreciated. No murmurs, gallops or rubs were noted.  Chest is nontender to palpation. 1+ pedal pulses. No lower extremity edema, calf tenderness or cyanosis was noted.  ABDOMEN: Soft, tender diffusely, but no rebound or guarding were noted. The patient does have intermittent discomfort more than other times, so when you talk to her calmly she is able to tolerate that the pain is somewhat better. Bowel sounds are present in all quadrants. No hepatosplenomegaly or masses were noted.  RECTAL: Deferred.  MUSCLE STRENGTH: Not able to assess as the patient is very somnolent. No cyanosis or degenerative joint disease. Not able to assess for  kyphosis . Gait was not tested.  SKIN: Did not reveal any rashes, lesions, erythema, nodularity or induration. It was warm and dry to palpation.  LYMPHATIC: No adenopathy in the cervical region.  NEUROLOGIC: Cranial nerves grossly intact. Sensory is intact. No dysarthria or aphasia. The patient is  somnolent, poorly cooperative. Not able to assess her otherwise.   LABORATORY DATA:  Showed normal BMP, except for glucose of 141, lipase level was 96. Albumin level of 3.2, otherwise liver enzymes normal. Troponin was less than 0.02. White blood cell count was 11.5, hemoglobin was 14.6, platelet count was 284, MCV high at 106.  Urinalysis: Yellow hazy urine, negative for glucose, bilirubin or ketones. Specific gravity 1.019, pH was 6.0, negative for blood, 30 mg/ dl protein , negative for nitrites, 3+ leukocyte esterase, 2 red blood cells, 29 white blood cells. No bacteria, 1 epithelial cell as well as mucus is present.   RADIOLOGIC STUDIES: CT of chest, abdomen and pelvis with contrast 06/27/2013 showed findings compatible with infectious inflammatory enterocolitis involving the distal ileum and left colon with distribution is most likely more suggestive of Crohn disease and ulcerative colitis. No drainable fluid collections, abscesses or free air were noted. Suspected small bowel enteritis and/or adynamic small bowel ileus.  Partial small bowel obstruction was not entirely excluded. Mildly dilated esophagus with layering fluid contrast, suggesting esophageal dysmotility or gastroesophageal reflux.  Thick walled bladder correlates with cystitis according to radiologist.   EKG:  The patient's EKG showed normal sinus rhythm at 91 beats per minute, possible left atrial enlargement, anterior infarct, age indeterminate with intraventricular conduction delay.  Left bundle branch block was noted in the prior EKG and nonspecific ST-T changes were noted.   ASSESSMENT AND PLAN:  1.  Nausea and vomiting.  Admit to observation at this time. Rule out small bowel  obstruction .We will admit the patient. We may need to place an NG tube if nausea and vomiting are recurrent . We will also get Gastroenterology involved. We will start the patient on IV fluids, antiemetics, proton pump inhibitors, IV as well as Levaquin and  Flagyl for possible  inflammatory changes as well as steroids IV. We will also get an enema as the patient apparently had obstruction of abdomen in the past, pseudoobstruction is not ruled out in this situation as well.  She is on chronic pain medications, opiates.  2.  Hypotension. We will continue IV fluids as well as steroid stress doses IV . 3.  Hyperglycemia. Get hemoglobin A1c.  4.  Pyuria. Questionable urinary tract infection. We will get cultures and will start the patient on Levaquin IV.   TIME SPENT: 50 minutes.    ____________________________ Theodoro Grist, MD rv:dp D: 06/27/2013 14:44:08 ET T: 06/27/2013 15:24:33 ET JOB#: [id number]  cc: Theodoro Grist, MD, <Dictator> Eduard Clos. Gilford Rile, MD Theodoro Grist MD ELECTRONICALLY SIGNED 07/31/2013 19:28

## 2014-09-21 NOTE — Consult Note (Signed)
PATIENT NAME:  Cindy Oconnor, Cindy Oconnor MR#:  [id number] DATE OF BIRTH:  [birth year]  DATE OF CONSULTATION:  06/28/2013  REFERRING PHYSICIAN:  Theodoro Grist, MD CONSULTING PHYSICIAN:  For [facility], Payton Emerald, NP, and Lupita Thetis Schwimmer. Candace Cruise, MD.  PRIMARY PHYSICIAN: Eduard Clos. Gilford Rile, MD  REASON FOR CONSULTATION: Nausea, vomiting. CT scan reflects small bowel obstruction.   HISTORY OF PRESENT ILLNESS: Cindy Oconnor is a 44 year old Caucasian female with a known history of Down syndrome as well as pancolitis. She resides at [facility] group home. She had acutely started to experience nausea, vomiting and abdominal pain on Tuesday afternoon. She was brought to the Emergency Room then eventually on Wednesday morning after vomiting persisted as well as the abdominal pain. Mother present during interviewing process to assist in history. No caretakers from group home presents though. On presentation to the Emergency Room, complaint of lower abdominal pain. Reported no bowel movements since 2 days prior. The patient normally does defecate on a daily basis. The patient is currently on Apriso 0.375 gram capsule 1 daily according to [facility]. The patient was unable to keep any liquids down as well prior to being seen in the Emergency Room. She had a colonoscopy that was done by Dr. Arther Dames in November 2014 with numerous random biopsies being obtained, and pathology report revealed no evidence of active colitis. Colon was normal except for mildly decreased vascularity noted.   The patient denies any abdominal pain at this time. After being seen in the Emergency Room, she did receive an enema, according to mother, good results. Also, the patient states that she did have a bowel movement this morning and has been passing flatulence. No nausea, no vomiting since being admitted. In fact, the patient requesting to eat. No evidence of rectal bleeding. No melena. Mother does state that about 4 years ago, a very similar presentation of  symptoms. She was hospitalized in [place], [place], at that time for small bowel obstruction.   PAST SURGICAL HISTORY: Significant for hysterectomy at the age of 84.   PAST MEDICAL HISTORY:  1. Pan ulcerative colitis.  2. Asthma with exacerbation in July 2013. 3. History of DVT. 4. Rheumatoid arthritis.  5. Hypothyroidism.  6. Down syndrome.  7. Abnormal EKG with left bundle branch block in the past.  8. History of admission for gastroenteritis and hypertension in August 2012.  9. Microcytic anemia.  10. Obesity.  11. Reflux.  12. Osteoporosis. 13. Right foot fracture. 14. Osteoarthritis.   PAST SURGICAL HISTORY:  1. Hysterectomy.  2. Two bunion surgeries.   HOME MEDICATIONS:  1. Apriso 0.375 grams capsule 1 daily. 2. Benzonatate 100 mg one 3 times daily as needed.  3. Calcium carbonate 1 a day.  4. Eucerin topical cream to affected areas twice a day.  5. Folic acid 1 mg daily. 6. Fosamax 70 mg once a week.  7. Gentamicin topical 0.1% topical ointment to affected areas twice daily. 8. Robitussin 400 mg one every 4 hours.  9. Lactulose 10 grams per 15 mL 45 mL once a day as needed for constipation. 10. Levothyroxine 100 mcg once a day.  11. Methotrexate 2.5 mg 7 tablets once a week.  12. Multivitamin. 13. Omeprazole 40 mg once daily.  14. Prednisone 5 mg once a day. 15. Spiriva 18 mcg capsule 1 inhalation daily. 16. Symbicort 160 mcg/4.5 mcg inhalation 2 puffs twice a day. 17. Tylenol No. 3 300 mg/30 mg oral tablet 1 to 2 every 4 to 6 hours  as needed.  18. Vitamin D 1000 units once a day. 19. Xopenex 3 mL inhaled 3 times a day as needed. 20. Zofran ODT 4 mg tablet 1 every 8 hours as needed.   ALLERGIES: LIST ALBUTEROL, ERYTHROMYCIN.   FAMILY HISTORY: Mother with history of congenital disease with pulmonary stenosis, multiple surgeries related to that. Uncle with history of Down syndrome as well as an aunt.  SOCIAL HISTORY: Resides at [facility] group home.  Does work there. No tobacco. No alcohol or drug use.   REVIEW OF SYSTEMS: All 10 systems reviewed and checked, otherwise unremarkable unless stated above, though history is obtained from mother predominantly.   PHYSICAL EXAMINATION:  VITAL SIGNS: Temperature 97.2 with a pulse of 76, respirations are 20, blood pressure is 91/52, pulse oximetry is 90% on room air.  GENERAL: Well-developed overweight 44 year old Caucasian female, no acute distress noted, pleasant.  HEENT: Normocephalic, atraumatic. Pupils equally reactive to light. Conjunctivae are clear. Sclerae nonicteric. Positive for glasses.  NECK: Supple. Trachea midline. No lymphadenopathy or thyromegaly.  PULMONARY: Symmetric rise and fall of chest. Clear to auscultation throughout.  CARDIOVASCULAR: Regular rate and rhythm, S1, S2. No murmurs or gallops.  ABDOMEN: Large, soft, nondistended. Hypoactive bowel sounds noted throughout. No bruits. No masses. No evidence of hepatosplenomegaly. Mild abdominal discomfort throughout.  RECTAL: Deferred.  MUSCULOSKELETAL: Moving all 4 extremities. Gait was assessed with assistance.  EXTREMITIES: No edema.  PSYCHIATRIC: Alert and oriented x4.  NEUROLOGICAL: No gross neurological deficits noted.   LABORATORY AND DIAGNOSTIC DATA: Chemistry panel on admission: Glucose was 141, calcium was 8.9, which has dropped to 7.6 today, chloride was 104, today 111. Hepatic panel: Albumin was 3.2 on admission; otherwise, panel within normal limits. Total protein has dropped from 6.9 to 5.4, and albumin has dropped to 2.2. Troponin less than 0.02. CBC: WBC count was 11.5 on admission, MCV was elevated at 106 with MCHC of 36.4 and RDW 15.5. RBC has dropped to 3.3. MCV, MCH remain elevated. WBC count has corrected to 7.7. Urine culture, clean-catch: Colonies too small to read. Urinalysis: +3 leukocytes, WBC is 29 per high-power field and RBC is 2 per high-power field. EKG showed normal sinus rhythm. CT of chest, abdomen and  pelvis with contrast reveals lung parenchyma is constrained by respiratory motion, evidence of cardiomegaly. Mildly dilated esophagus with layering of fluid contrast, suggesting esophageal dysmotility, reflux. Liver, spleen, pancreas and adrenal glands are unremarkable. Gallbladder unremarkable. Kidneys are normal. Mild mucosal thickening and inflammatory changes involving the distal ileum, mildly dilated loops of proximal and mid small bowel with associated mesenteric edema leading into this loop of abnormal small bowel, raising the possibility of small bowel enteritis and/or adynamic small bowel ileitis, partial small bowel obstruction could not be excluded, abdominal wall thickening/inflammatory changes predominantly involving the distal descending and sigmoid colon compatible with colitis. Rectum is spared. Appearance compatible with infectious/inflammatory enterocolitis, suggestion of pattern of Crohn's. Mild thickening of bladder. Degenerative changes noted to L2-L3 as well as osseous fusion of L5-S1. Chest x-ray: Heart size and pulmonary vascularity is normal. Lungs are clear. No acute osseous abnormality noted.   IMPRESSION: Known history of pan ulcerative colitis. Most recent colonoscopy was November 2014 with no evidence of active inflammation, appeared to be in remission at that time. Small bowel obstruction. The patient is at increased risk with history of hysterectomy in the past. Nausea, vomiting, resolved at this time, as well as abdominal pain.   PLAN: The patient's presentation will be discussed with Dr. Eddie Dibbles  Oh. Addendum to follow.   These services provided by Payton Emerald, MS, APRN, [facility], FNP, under collaborative agreement with Dr. Verdie Shire.  ____________________________ Payton Emerald, NP dsh:lb D: 06/28/2013 13:40:50 ET T: 06/28/2013 14:22:29 ET JOB#: [id number]  cc: Payton Emerald, NP, <Dictator> Payton Emerald MD ELECTRONICALLY SIGNED 06/29/2013 7:31

## 2014-09-21 NOTE — Consult Note (Signed)
Brief Consult Note: Diagnosis: Known history of universal ulcerative colitis.  Admitted for partial SBO.  Down's Syndome.  Constipation.   Orders entered.   Comments: Patient's presentation discussed with Dr. Verdie Shire.  Recommendation to proceed with flat and upright of abdomen for the indication of continued evaluation and assessment of partial SBO.  If obstructive process appears to have resolved or improving will attempt to advance diet to clear liquid diet and advance to full liquid as tolerated.  Apriso should be advanced to recommended therapeutic dose of 0.375 gm capsules with directions of four capsules daily when able to tolerate diet.  She should be discharged on this dosage when discharged. Will continue to monitor..  Electronic Signatures: Payton Emerald (NP)  (Signed 29-Jan-15 15:11)  Authored: Brief Consult Note   Last Updated: 29-Jan-15 15:11 by Payton Emerald (NP)

## 2014-09-21 NOTE — Discharge Summary (Signed)
PATIENT NAME:  Cindy Oconnor, Cindy Oconnor MR#:  [id number] DATE OF BIRTH:  May 05, 1971  DATE OF ADMISSION:  06/27/2013 DATE OF DISCHARGE:  06/28/2013  DISCHARGE DIAGNOSES: 1.  Nausea, vomiting, likely due to severe constipation, transient and now resolved.  Tolerating diet fine.  2.  Pyuria, no urinary tract infection, stopped antibiotics.   SECONDARY DIAGNOSES: 1.  History of asthma.  2.  Deep vein thrombosis, now on Coumadin.  3.  Gastroesophageal reflux disease.  4.  Osteoporosis.   CONSULTATIONS:  Dr. Candace Cruise.   PROCEDURES AND RADIOLOGY:  Chest x-ray on 28th of January showed no acute abnormality.   CT scan of the chest, abdomen and pelvis with contrast on 28th of January showed possible enterocolitis.   MAJOR LABORATORY PANEL:  UA on admission showed no bacteria, 29 WBCs, 3+ leukocyte esterase.    Urine culture was negative.   HISTORY AND SHORT HOSPITAL COURSE:  The patient is a 44 year old female with the above-mentioned medical problems who was admitted for nausea and vomiting, worrisome for possible GI etiology.  Early on the patient was started on IV antibiotics thinking possible colitis, based on the CT scan also, although the patient also had a significant constipation, for which she was started on aggressive stool softener regimen considering her chronic pain medication and opiates.  The patient had a good bowel movement and she was feeling much better after having her bowel movement.  Please see Dr. Keenan Bachelor dictated history and physical for further details on admission.  After having bowel movement, the patient was started on diet and she tolerated fine.  GI consult was obtained with Dr. Candace Cruise who recommended increasing her dose of Apriso to 4 capsules daily for better symptom control.  After discussion with the patient's mother, the patient was discharged back to group home in stable condition.   PHYSICAL EXAMINATION: VITAL SIGNS:  On the date of discharge, her vital signs were as follows:   Temperature 98.5, heart rate 78 per minute, respirations 20 per minute, blood pressure 109/50 mmHg.  She was saturating 93% on room air.  Pertinent physical examination on the date of discharge:  CARDIOVASCULAR:  S1, S2 normal.  No murmurs, rubs or gallop.  LUNGS:  Clear to auscultation bilaterally.  No wheezing, rales, rhonchi, or crepitation.  ABDOMEN:  Soft, benign.  NEUROLOGIC:  Nonfocal examination.  All other physical examination remained at baseline.   DISCHARGE MEDICATIONS: 1.  Spiriva once daily.  2.  Levothyroxine 100 mcg by mouth daily.  3.  Symbicort 2 puffs inhaled twice a day. 4.  Folic acid 1 mg by mouth daily.  5.  Omeprazole 40 mg by mouth daily.  6.  Guaifenesin 400 mg by mouth every four hours as needed.  7.  Benzonatate 100 mg by mouth 3 times a day as needed.  8.  Prednisone 5 mg by mouth daily.  9.  Gentamicin topical 0.1% to affected area twice a day as needed.  10.  Methotrexate 2.5 mg seven tablets by mouth once a week.  11.  Xopenex inhalation 3 times a day as needed.  12.  Zofran 4 mg by mouth every eight hours as needed.  13.  Lactulose 45 mL by mouth daily as needed.  14.  Tylenol with Codeine 1 to 2 tablets by mouth every 4 to 6 hours as needed.  15.  Multivitamin once a day.  16.  Fosamax 70 mg by mouth once a week.  17.  Vitamin D3 1 capsule by mouth daily.  18.  Calcium carbonate 1 tablet by mouth daily.  19.  Eucerin topical cream to affected area twice a day.  20.  Apriso 0.375 grams 4 capsules by mouth daily every morning.  21.  Senna 8.6 mg by mouth at bedtime.   DISCHARGE DIET:  Regular.   DISCHARGE ACTIVITY:  As tolerated.   DISCHARGE INSTRUCTIONS AND FOLLOW-UP:  The patient was instructed to follow up with her primary care physician, Dr. Ronette Deter, in 1 to 2 weeks.  She will need follow-up with [facility] in 2 to 4 weeks.   Total time discharging this patient was 45 minutes.    ____________________________ Lucina Mellow. Manuella Ghazi,  MD vss:ea D: 06/28/2013 21:43:48 ET T: 06/29/2013 02:42:16 ET JOB#: [id number]  cc: Mikiah Durall S. Manuella Ghazi, MD, <Dictator> Eduard Clos. Gilford Rile, MD Lupita Dawn. Candace Cruise, MD Remer Macho MD ELECTRONICALLY SIGNED 06/30/2013 14:49

## 2014-09-21 NOTE — Consult Note (Signed)
PATIENT NAME:  Cindy Oconnor, Cindy Oconnor MR#:  [id number] DATE OF BIRTH:  1971-01-05  DATE OF CONSULTATION:  06/28/2013  CONSULTING PHYSICIAN:  Payton Emerald, NP  Addendum   PLAN: The patient's presentation was discussed with Dr. Verdie Shire. Recommendation is to proceed with a flat and upright today to assess for resolution of small bowel obstruction. If obstructive process does appear to have resolved or in the process, will proceed with advancing her diet to a clear liquid diet and then advance to a full liquid diet as tolerated. Do recommend increasing Apriso to 4 capsules once a day, which is recommended adult dose for management of pan ulcerative colitis. Order will be placed. Will continue to monitor during her hospitalization.   These services provided by Payton Emerald, MS, APRN, [facility], FNP, under collaborative agreement with Dr. Verdie Shire.  ____________________________ Payton Emerald, NP dsh:lb D: 06/28/2013 15:06:55 ET T: 06/28/2013 15:22:11 ET JOB#: [id number]  cc: Payton Emerald, NP, <Dictator> Payton Emerald MD ELECTRONICALLY SIGNED 06/29/2013 7:31

## 2014-09-22 NOTE — Discharge Summary (Signed)
PATIENT NAME:  Cindy Oconnor, Cindy Oconnor MR#:  [id number] DATE OF BIRTH:  1970-09-29  DATE OF ADMISSION:  12/03/2011 DATE OF DISCHARGE:  12/10/2011  PRIMARY CARE PHYSICIAN: None local.  DISCHARGE DIAGNOSES:  1. Asthma exacerbation.  2. Acute bronchitis.  3. Hypotension.   PROCEDURE: None.  CODE STATUS: FULL CODE.   HOME MEDICATIONS: Please refer to [facility] physician discharge instructions.   HOME OXYGEN: None.   DIET: Regular.   ACTIVITY: As tolerated.   FOLLOW-UP CARE: Followup with PCP within 1 week.  REASON FOR ADMISSION: Dizziness, productive cough and wheezing.   HOSPITAL COURSE: The patient is a 44 year old female with a history of Down's syndrome,  asthma, deep vein thrombosis, RA, hypothyroidism, IBD and ulcerative colitis who presented to  the ED with dizziness, productive cough, and wheezing. She came from a group home. She was noted to have productive yellow sputum and cough, but no fever, nausea, or vomiting. For a detailed history and physical examination, please refer to the admission note dictated by Dr. Brien Few Phichith. On admission date, the patient's WBC was 6, hemoglobin 13.3, BUN 21, and creatinine 1.16. Chest x-ray from urgent care showed bibasilar interstitial prominence. The patient was admitted for asthma exacerbation, possible pneumonia. After admission the patient has been treated with Levaquin, Spiriva, Symbicort, and Solu-Medrol. The patient continued to have cough, wheezing, and sputum. However, repeat chest x-ray showed no infiltrate. No pneumonia.  Sputum culture shows heavy growth of Serratia Marcescens. During hospitalization, the patient developed hypotension which was treated with normal saline bolus IV. Blood pressure improved later. Today the patient's symptoms have much improved. Physical examination shows bilateral lungs are clear. No wheezing or crackles. She is clinically stable and will be discharged to home today with taper of prednisone.  I discussed  the patient's discharge plan with the patient's mother, the nurse, and the case manager.   TIME SPENT: About 50 minutes.  ____________________________ Demetrios Loll, MD qc:slb D: 12/10/2011 14:45:47 ET T: 12/10/2011 15:15:21 ET JOB#: [id number]  cc: Demetrios Loll, MD, <Dictator> Demetrios Loll MD ELECTRONICALLY SIGNED 12/10/2011 15:43

## 2014-09-22 NOTE — H&P (Signed)
PATIENT NAME:  Cindy Oconnor, Cindy Oconnor MR#:  [id number] DATE OF BIRTH:  09/17/1970  DATE OF ADMISSION:  12/03/2011  REFERRING PHYSICIAN: Marta Antu, MD    PRIMARY CARE PHYSICIAN: [facility], will be establishing with Ronette Deter, MD on July 31st.   PRESENTING COMPLAINT: Dizziness, productive cough, wheezing.   HISTORY OF PRESENT ILLNESS: Cindy Oconnor is a 44 year old woman with history of Down syndrome, asthma, deep vein thrombosis, rheumatoid arthritis, hypothyroidism, inflammatory bowel disease/ulcerative colitis, who presents from a group home. She is not able to provide history. Her mother provides limited history as she just saw her yesterday but reports that as far as she knows she was noticing she was having productive yellow sputum cough. No fevers, nausea, or vomiting. She denies any problems with dysphagia. She was complaining of chest pain this morning. No shortness of breath. No palpitations or syncope. She denies any diarrhea or bloody stools.   PAST MEDICAL HISTORY:  1. Last admitted in August of 2012 with acute gastroenteritis and hypotension in the setting of hypovolemia and diarrhea.  2. History of asthma.  3. History of deep venous thrombosis, no longer on Coumadin.  4. Rheumatoid arthritis.  5. Hypothyroidism.  6. Microcytic anemia.  7. Down syndrome with no evidence of congenital heart disease on echocardiogram.  8. Inflammatory bowel disease and ulcerative colitis.  9. Obesity.  10. Gastroesophageal reflux disease.  11. Osteoporosis.  12. History of right foot fracture.  13. Osteoarthritis.    PAST SURGICAL HISTORY:  1. Hysterectomy.  2. Bunion surgery x2.   ALLERGIES: No known drug allergies.   MEDICATIONS:  1. Lactulose 10 grams/15 mL, 30 mL as needed.  2. Symbicort 2 puffs b.i.d.  3. Spiriva daily.  4. Aspirin 81 mg daily.  5. Calcium plus D 500/200 daily.  6. Folic acid 1 mg daily.  7. Levothyroxine 100 mcg daily.  8. Omeprazole 40 mg daily.   9. Prednisone 5 mg daily.  10. Apriso ER 0.375 grams daily.  11. Guaifenesin 400 mg as needed.  12. Methotrexate 2.5 mg, 4 tablets every Friday.  13. Tessalon Perles 100 mg t.i.d. as needed.  14. Diphenhydramine 25 mg every 6 hours as needed.   FAMILY HISTORY: Mother has congenital heart disease with pulmonic stenosis and multiple surgeries related to that.  Aunt and uncle with history of Down syndrome.   SOCIAL HISTORY: She resides at [facility], remains active. No tobacco, alcohol or drug use.   REVIEW OF SYSTEMS: Review of systems is limited and provided by her mother. CONSTITUTIONAL: No fevers, nausea or vomiting. EYES: No vision disturbance. ENT: No epistaxis or discharge. RESPIRATORY: As per history of present illness. CARDIOVASCULAR: Reports chest pain. No shortness of breath. GI: No nausea, vomiting, abdominal pain or bright red blood per rectum. GU: No dysuria or hematuria. ENDOCRINE: No polyuria or polydipsia. HEMATOLOGIC:  Easy bruising. SKIN: No ulcers. MUSCULOSKELETAL: Denies any cramping or pain. NEUROLOGIC: No weakness on one side of the body or the other. PSYCHIATRIC: No history of depression.     PHYSICAL EXAMINATION:  VITAL SIGNS: Temperature 98.4, pulse 78, respiratory rate 20, blood pressure 117/72, sating at 95% on room air.   GENERAL: Lying in bed in no apparent distress.   HEENT: Normocephalic, atraumatic. Pupils are equal, symmetric, anicteric. Nares without discharge. Moist mucous membrane.   NECK: Soft and supple. No adenopathy or JVP.   CARDIOVASCULAR: Non-tachy. No murmurs, rubs, or gallops.   LUNGS: Diffuse wheezing. No use of accessory muscles or increased  respiratory effort.   ABDOMEN: Soft. Positive bowel sounds. No mass appreciated.   EXTREMITIES: Trace edema. Dorsal pedis pulses intact.   MUSCULOSKELETAL: No joint effusion.   SKIN: She has got scant ecchymosis, no ulcers.    NEUROLOGIC: Symmetrical strength. No focal deficits.    PSYCHIATRIC: She is alert and cooperative.   PERTINENT LABORATORY, DIAGNOSTIC AND RADIOLOGICAL DATA:  WBC 6, hemoglobin 13.3, hematocrit 40.9, platelet 273, MCV 109.  Glucose 116, BUN 21, creatinine 1.16, sodium 114, potassium 4.2, chloride 107, carbon dioxide 31, calcium 9.1.  EKG with sinus rate of 65, left bundle branch block. No ST elevation or depression.  Chest x-ray reviewed from Urgent Care shows bilateral interstitial prominence. Official chest x-ray read is pending.   ASSESSMENT AND PLAN: Cindy Oconnor is a 44 year old woman with history of Down syndrome, asthma, deep vein thrombosis, rheumatoid arthritis, hypothyroidism, ulcerative colitis, presenting with productive cough, wheezing, dizziness and chest pain.  1. Pneumonia/asthma exacerbation: In review of the x-ray from Urgent Care, probable bilateral pneumonia. We will continue Levaquin, Spiriva, Symbicort and hold p.o. prednisone and continue on Solu-Medrol. Use oxygen as needed and continue with SVNs standing. Obtain a sputum culture. Her chest pain is likely in the setting of her pneumonia and cough.  2. Hypothyroidism.: Restart levothyroxine.  3. Ulcerative colitis: Restart Apriso ER.  4. Rheumatoid arthritis: She is on methotrexate every Friday, and resume folic acid as above, holding her prednisone.  5. Prophylaxis with aspirin, Lovenox and omeprazole.   TIME SPENT: Approximately 50 minutes on patient care.   ____________________________ Rita Ohara, MD ap:cbb D: 12/04/2011 00:12:24 ET T: 12/04/2011 10:25:13 ET JOB#: [id number]  cc: Brien Few Laiken Sandy, MD, <Dictator> Anderson Malta A. Gilford Rile, MD Rita Ohara MD ELECTRONICALLY SIGNED 12/18/2011 0:30

## 2014-10-03 ENCOUNTER — Ambulatory Visit: Payer: Medicare Other | Admitting: Podiatry

## 2014-10-09 ENCOUNTER — Other Ambulatory Visit: Payer: Self-pay | Admitting: *Deleted

## 2014-10-09 MED ORDER — CALCIUM CARBONATE 1250 (500 CA) MG PO CAPS: 1250.0000 mg | ORAL_CAPSULE | Freq: Every day | ORAL | Status: DC

## 2014-10-09 MED ORDER — VITAMIN D3 25 MCG (1000 UT) PO CAPS: 1000.0000 [IU] | ORAL_CAPSULE | Freq: Every day | ORAL | Status: DC

## 2014-10-09 MED ORDER — DOCUSATE SODIUM 100 MG PO CAPS: 100.0000 mg | ORAL_CAPSULE | ORAL | Status: DC

## 2014-11-06 DIAGNOSIS — M5106 Intervertebral disc disorders with myelopathy, lumbar region: Secondary | ICD-10-CM | POA: Diagnosis not present

## 2014-11-06 DIAGNOSIS — M1710 Unilateral primary osteoarthritis, unspecified knee: Secondary | ICD-10-CM | POA: Diagnosis not present

## 2014-11-06 DIAGNOSIS — E039 Hypothyroidism, unspecified: Secondary | ICD-10-CM | POA: Diagnosis not present

## 2014-11-06 DIAGNOSIS — M6530 Trigger finger, unspecified finger: Secondary | ICD-10-CM | POA: Diagnosis not present

## 2014-11-06 DIAGNOSIS — M064 Inflammatory polyarthropathy: Secondary | ICD-10-CM | POA: Diagnosis not present

## 2014-11-06 DIAGNOSIS — M810 Age-related osteoporosis without current pathological fracture: Secondary | ICD-10-CM | POA: Diagnosis not present

## 2014-11-06 DIAGNOSIS — M329 Systemic lupus erythematosus, unspecified: Secondary | ICD-10-CM | POA: Diagnosis not present

## 2014-11-06 DIAGNOSIS — Z86718 Personal history of other venous thrombosis and embolism: Secondary | ICD-10-CM | POA: Diagnosis not present

## 2014-11-06 DIAGNOSIS — M1990 Unspecified osteoarthritis, unspecified site: Secondary | ICD-10-CM | POA: Diagnosis not present

## 2014-11-06 DIAGNOSIS — M069 Rheumatoid arthritis, unspecified: Secondary | ICD-10-CM | POA: Diagnosis not present

## 2014-11-06 DIAGNOSIS — J45909 Unspecified asthma, uncomplicated: Secondary | ICD-10-CM | POA: Diagnosis not present

## 2014-11-06 DIAGNOSIS — M19042 Primary osteoarthritis, left hand: Secondary | ICD-10-CM | POA: Diagnosis not present

## 2014-11-06 DIAGNOSIS — Q909 Down syndrome, unspecified: Secondary | ICD-10-CM | POA: Diagnosis not present

## 2014-11-06 DIAGNOSIS — Z79899 Other long term (current) drug therapy: Secondary | ICD-10-CM | POA: Diagnosis not present

## 2014-11-06 DIAGNOSIS — M19041 Primary osteoarthritis, right hand: Secondary | ICD-10-CM | POA: Diagnosis not present

## 2014-11-07 ENCOUNTER — Other Ambulatory Visit: Payer: Self-pay | Admitting: *Deleted

## 2014-11-07 MED ORDER — LEVOTHYROXINE SODIUM 100 MCG PO TABS: ORAL_TABLET | ORAL | Status: DC

## 2014-11-14 ENCOUNTER — Ambulatory Visit (INDEPENDENT_AMBULATORY_CARE_PROVIDER_SITE_OTHER): Payer: Medicare Other | Admitting: Podiatry

## 2014-11-14 DIAGNOSIS — M79676 Pain in unspecified toe(s): Secondary | ICD-10-CM | POA: Diagnosis not present

## 2014-11-14 DIAGNOSIS — B351 Tinea unguium: Secondary | ICD-10-CM

## 2014-11-14 NOTE — Progress Notes (Signed)
Patient ID: Cindy Oconnor, female   DOB: 1971/03/27, 44 y.o.   MRN: [id number]  Subjective: [name] returns the office today with complaints of elongated thickened toenails as well as calluses to both of her feet. She states of the areas are painful. Denies any recent redness or drainage from the nail sites/calluses. Denies any recent ulcerations. No other complaints at this time. She is accompanied today by caregiver from her facility   Objective: AAO 3, NAD  DP/PT pulses palpable bilaterally, CRT less than 3 seconds  Protective sensation intact with Derrel Nip monofilament Nails are hypertrophic, dystrophic, elongated, brittle, discolored 10. There tenderness over nails 1-5 bilaterally. No surrounding erythema or drainage from the nail sites.  Hyperkeratotic lesions bilateral submetatarsal 1, right plantar heel, right lateral fifth digit  left foot distal third digit. Upon debridement a lesion no underlying ulceration and no clinical signs of infection.  No other areas of tenderness to bilateral lower extremities and no overlying edema, erythema, increase in warmth.  No pain with calf compression, swelling, warmth, erythema.   Assessment : 44 year old female with symptomatic onychomycosis, hyperkeratotic lesions   Plan: -Treatment options discussed including alternatives, risks, complications. -Nail sharply debrided 10 without complication/bleeding -Hyperkeratotic lesions sharply debrided without complication/bleeding -Discussed the importance of daily foot inspection -Follow-up in 3 months or sooner if any problems are to arise. In the meantime encouraged to call the office with any questions, concerns, change in symptoms.

## 2014-11-18 ENCOUNTER — Ambulatory Visit: Payer: Medicare Other | Admitting: Internal Medicine

## 2014-12-12 ENCOUNTER — Ambulatory Visit: Payer: Medicare Other | Admitting: Podiatry

## 2015-01-16 ENCOUNTER — Other Ambulatory Visit: Payer: Self-pay | Admitting: Internal Medicine

## 2015-01-21 ENCOUNTER — Other Ambulatory Visit: Payer: Self-pay | Admitting: Surgical

## 2015-01-21 ENCOUNTER — Other Ambulatory Visit: Payer: Self-pay | Admitting: *Deleted

## 2015-01-21 ENCOUNTER — Ambulatory Visit: Payer: Medicare Other

## 2015-01-21 MED ORDER — BUDESONIDE-FORMOTEROL FUMARATE 160-4.5 MCG/ACT IN AERO: INHALATION_SPRAY | RESPIRATORY_TRACT | Status: DC

## 2015-01-21 MED ORDER — TIOTROPIUM BROMIDE MONOHYDRATE 18 MCG IN CAPS: ORAL_CAPSULE | RESPIRATORY_TRACT | Status: DC

## 2015-01-21 NOTE — Progress Notes (Signed)
Refill sent for Spiriva.

## 2015-02-11 ENCOUNTER — Ambulatory Visit (INDEPENDENT_AMBULATORY_CARE_PROVIDER_SITE_OTHER): Payer: Medicare Other | Admitting: Podiatry

## 2015-02-11 DIAGNOSIS — B351 Tinea unguium: Secondary | ICD-10-CM | POA: Diagnosis not present

## 2015-02-11 DIAGNOSIS — B353 Tinea pedis: Secondary | ICD-10-CM | POA: Diagnosis not present

## 2015-02-11 DIAGNOSIS — M79676 Pain in unspecified toe(s): Secondary | ICD-10-CM | POA: Diagnosis not present

## 2015-02-11 MED ORDER — KETOCONAZOLE 2 % EX CREA: 1.0000 "application " | TOPICAL_CREAM | Freq: Every day | CUTANEOUS | Status: DC

## 2015-02-11 NOTE — Progress Notes (Signed)
Patient ID: Diannie Willner, female   DOB: January 27, 1971, 44 y.o.   MRN: [id number]  Subjective: Ms. Dekoning returns the office today with complaints of elongated thickened toenails as well as calluses to both of her feet. She states of the areas are painful. Denies any recent redness or drainage from the nail sites/calluses. She also states that she has a rash the top of her right foot and between her fourth and fifth toes of both feet. The area does itch. Denies any recent ulcerations/sores to her feet. No other complaints at this time. She is accompanied today by caregiver from her facility   Objective: AAO 3, NAD  DP/PT pulses palpable bilaterally, CRT less than 3 seconds  Protective sensation intact with Derrel Nip monofilament Nails are hypertrophic, dystrophic, elongated, brittle, discolored 10. There tenderness over nails 1-5 bilaterally. No surrounding erythema or drainage from the nail sites.  Hyperkeratotic lesions bilateral submetatarsal 1, right plantar heel, right lateral fifth digit  left foot distal third digit. Upon debridement a lesion no underlying ulceration and no clinical signs of infection.  On the dorsal aspect of the right forefoot just proximal to the sulcus of the first and second toes as well as interdigitally between the fourth and fifth toes bilaterally there is dry complaints and a slight erythematous base consistent with tinea pedis. No other areas of tenderness to bilateral lower extremities and no overlying edema, erythema, increase in warmth.  No pain with calf compression, swelling, warmth, erythema.   Assessment : 44 year old female with symptomatic onychomycosis, hyperkeratotic lesions   Plan: -Treatment options discussed including alternatives, risks, complications. -Nail sharply debrided 10 without complication/bleeding -Hyperkeratotic lesions sharply debrided without complication/bleeding -Ketoconazle. If not gone in the next couple of weeks  we'll likely switch Lamisil by mouth as this is what she had previously. -Discussed the importance of daily foot inspection -Follow-up in 3 months or sooner if any problems are to arise. In the meantime encouraged to call the office with any questions, concerns, change in symptoms.   Celesta Gentile, DPM

## 2015-02-12 ENCOUNTER — Other Ambulatory Visit: Payer: Self-pay | Admitting: Internal Medicine

## 2015-02-13 ENCOUNTER — Telehealth: Payer: Self-pay | Admitting: *Deleted

## 2015-02-13 NOTE — Telephone Encounter (Signed)
[name] called states pt no longer needs the Lotrimin 2% spray, needs D/C orders and D/C orders to [facility] [phone].  Dr. Jacqualyn Posey states D/C Lotrimin Spray.  Called [facility] and D/C.

## 2015-02-18 ENCOUNTER — Ambulatory Visit: Payer: Medicare Other

## 2015-02-19 ENCOUNTER — Ambulatory Visit: Payer: Medicare Other | Admitting: Internal Medicine

## 2015-02-27 ENCOUNTER — Ambulatory Visit (INDEPENDENT_AMBULATORY_CARE_PROVIDER_SITE_OTHER): Payer: Medicare Other | Admitting: Internal Medicine

## 2015-02-27 ENCOUNTER — Encounter: Payer: Self-pay | Admitting: Internal Medicine

## 2015-02-27 VITALS — BP 107/67 | HR 82 | Temp 98.0°F | Ht <= 58 in | Wt 169.4 lb

## 2015-02-27 DIAGNOSIS — M069 Rheumatoid arthritis, unspecified: Secondary | ICD-10-CM

## 2015-02-27 DIAGNOSIS — Z23 Encounter for immunization: Secondary | ICD-10-CM

## 2015-02-27 DIAGNOSIS — E669 Obesity, unspecified: Secondary | ICD-10-CM

## 2015-02-27 DIAGNOSIS — F70 Mild intellectual disabilities: Secondary | ICD-10-CM | POA: Diagnosis not present

## 2015-02-27 DIAGNOSIS — E039 Hypothyroidism, unspecified: Secondary | ICD-10-CM

## 2015-02-27 DIAGNOSIS — L0293 Carbuncle, unspecified: Secondary | ICD-10-CM

## 2015-02-27 DIAGNOSIS — Q909 Down syndrome, unspecified: Secondary | ICD-10-CM

## 2015-02-27 NOTE — Assessment & Plan Note (Signed)
Continues to live in group home. Doing well. Continue supportive care.

## 2015-02-27 NOTE — Progress Notes (Signed)
Subjective:    Patient ID: Cindy Oconnor, female    DOB: Oct 27, 1970, 44 y.o.   MRN: [id number]  HPI  44YO female presents for follow up.  RA - Following in [facility]. Symptoms well controlled on current regimen. Had steroid injection in hands which helped some.  Obesity - Trying to follow healthy diet and increase fluid intake. Walking some.  Compliant with medications.  She continues to have some intermittent boils in groin area. None at present. She has been using topical Gentamicin with some improvement.  Wt Readings from Last 3 Encounters:  02/27/15 169 lb 6 oz (76.828 kg)  08/22/14 173 lb 4 oz (78.586 kg)  07/17/14 169 lb 12.8 oz (77.021 kg)   BP Readings from Last 3 Encounters:  02/27/15 107/67  08/22/14 114/72  07/17/14 106/78    Past Medical History  Diagnosis Date  . Asthma   . Arthritis   . Chicken pox   . RA (rheumatoid arthritis)   . Ulcerative colitis   . Deafness in left ear   . Down syndrome    Family History  Problem Relation Age of Onset  . Heart disease Mother   . Diabetes Mother   . Heart disease Father   . Diabetes Father   . Alcohol abuse Maternal Grandmother   . Arthritis Maternal Grandmother   . Stroke Maternal Grandmother   . Diabetes Maternal Grandmother   . Alcohol abuse Maternal Grandfather   . Arthritis Maternal Grandfather   . Stroke Maternal Grandfather   . Diabetes Maternal Grandfather    Past Surgical History  Procedure Laterality Date  . Abdominal hysterectomy  1992    partial  . Foot surgery      x2   Social History   Social History  . Marital Status: Single    Spouse Name: N/A  . Number of Children: N/A  . Years of Education: N/A   Social History Main Topics  . Smoking status: Never Smoker   . Smokeless tobacco: None  . Alcohol Use: No  . Drug Use: None  . Sexual Activity: Not Asked   Other Topics Concern  . None   Social History Narrative   Lives at [facility] group home.     Review of Systems  Constitutional: Negative for fever, chills, appetite change, fatigue and unexpected weight change.  Eyes: Negative for visual disturbance.  Respiratory: Negative for shortness of breath.   Cardiovascular: Negative for chest pain and leg swelling.  Gastrointestinal: Negative for nausea, vomiting, abdominal pain, diarrhea and constipation.  Musculoskeletal: Positive for arthralgias.  Skin: Negative for color change and rash.  Hematological: Negative for adenopathy. Does not bruise/bleed easily.  Psychiatric/Behavioral: Negative for sleep disturbance and dysphoric mood. The patient is not nervous/anxious.        Objective:    BP 107/67 mmHg  Pulse 82  Temp(Src) 98 F (36.7 C) (Oral)  Ht 4' 8.5" (1.435 m)  Wt 169 lb 6 oz (76.828 kg)  BMI 37.31 kg/m2  SpO2 96%  LMP 04/11/1981 Physical Exam  Constitutional: She is oriented to person, place, and time. She appears well-developed and well-nourished. No distress.  HENT:  Head: Normocephalic and atraumatic.  Right Ear: External ear normal.  Left Ear: External ear normal.  Nose: Nose normal.  Mouth/Throat: Oropharynx is clear and moist. No oropharyngeal exudate.  Eyes: Conjunctivae are normal. Pupils are equal, round, and reactive to light. Right eye exhibits no discharge. Left eye exhibits no discharge. No  scleral icterus.  Neck: Normal range of motion. Neck supple. No tracheal deviation present. No thyromegaly present.  Cardiovascular: Normal rate, regular rhythm, normal heart sounds and intact distal pulses.  Exam reveals no gallop and no friction rub.   No murmur heard. Pulmonary/Chest: Effort normal and breath sounds normal. No respiratory distress. She has no wheezes. She has no rales. She exhibits no tenderness.  Musculoskeletal: She exhibits no edema or tenderness.       Right hand: She exhibits decreased range of motion and swelling.       Left hand: She exhibits decreased range of motion and swelling.   Diffuse joint swelling in hands  Lymphadenopathy:    She has no cervical adenopathy.  Neurological: She is alert and oriented to person, place, and time. No cranial nerve deficit. She exhibits normal muscle tone. Coordination normal.  Skin: Skin is warm and dry. No rash noted. She is not diaphoretic. No erythema. No pallor.  Psychiatric: She has a normal mood and affect. Her behavior is normal. Judgment and thought content normal.          Assessment & Plan:   Problem List Items Addressed This Visit      Unprioritized   DOWN'S SYNDROME    Continues to live in group home. Doing well. Continue supportive care.      Hypothyroidism - Primary    Repeat TSH with labs in 2017. Continue Levothyroxine.      Obesity (BMI 30-39.9)    Wt Readings from Last 3 Encounters:  02/27/15 169 lb 6 oz (76.828 kg)  08/22/14 173 lb 4 oz (78.586 kg)  07/17/14 169 lb 12.8 oz (77.021 kg)   Encouraged continued healthy diet and exercise.      Recurrent boils    Symptoms well controlled with prn Gentamicin. Will continue.      Rheumatoid arthritis    Reviewed notes from [facility]. Continue current medications. Plan to repeat CBC and CMP at visit in 2017.          Return in about 3 months (around 05/29/2015) for Physical.

## 2015-02-27 NOTE — Assessment & Plan Note (Signed)
Symptoms well controlled with prn Gentamicin. Will continue.

## 2015-02-27 NOTE — Patient Instructions (Signed)
Follow for physical in January 2017.

## 2015-02-27 NOTE — Progress Notes (Signed)
Pre visit review using our clinic review tool, if applicable. No additional management support is needed unless otherwise documented below in the visit note. 

## 2015-02-27 NOTE — Assessment & Plan Note (Signed)
Wt Readings from Last 3 Encounters:  02/27/15 169 lb 6 oz (76.828 kg)  08/22/14 173 lb 4 oz (78.586 kg)  07/17/14 169 lb 12.8 oz (77.021 kg)   Encouraged continued healthy diet and exercise.

## 2015-02-27 NOTE — Assessment & Plan Note (Signed)
Repeat TSH with labs in 2017. Continue Levothyroxine.

## 2015-02-27 NOTE — Assessment & Plan Note (Signed)
Reviewed notes from [facility]. Continue current medications. Plan to repeat CBC and CMP at visit in 2017.

## 2015-03-01 DIAGNOSIS — F70 Mild intellectual disabilities: Secondary | ICD-10-CM | POA: Diagnosis not present

## 2015-03-17 ENCOUNTER — Other Ambulatory Visit: Payer: Self-pay | Admitting: Internal Medicine

## 2015-03-17 NOTE — Telephone Encounter (Signed)
Please advise refill? 

## 2015-03-25 DIAGNOSIS — M6530 Trigger finger, unspecified finger: Secondary | ICD-10-CM | POA: Diagnosis not present

## 2015-03-25 DIAGNOSIS — M189 Osteoarthritis of first carpometacarpal joint, unspecified: Secondary | ICD-10-CM | POA: Diagnosis not present

## 2015-03-27 ENCOUNTER — Other Ambulatory Visit: Payer: Self-pay | Admitting: Internal Medicine

## 2015-03-27 NOTE — Telephone Encounter (Signed)
Please advise 

## 2015-04-05 ENCOUNTER — Ambulatory Visit
Admission: EM | Admit: 2015-04-05 | Discharge: 2015-04-05 | Disposition: A | Discharge status: Home / Self Care | Payer: Medicare Other | Attending: Family Medicine | Admitting: Family Medicine

## 2015-04-05 ENCOUNTER — Encounter: Payer: Self-pay | Admitting: Gynecology

## 2015-04-05 DIAGNOSIS — R2241 Localized swelling, mass and lump, right lower limb: Secondary | ICD-10-CM

## 2015-04-05 HISTORY — DX: Acute embolism and thrombosis of unspecified vein: I82.90

## 2015-04-05 NOTE — Discharge Instructions (Signed)
This is from trauma vs superficial clot.  Use Tylenol or ibuprofen as needed.  Use heat to the area as well if needed.  We will call regarding your Ultrasound.  Take care  Dr. Lacinda Axon

## 2015-04-05 NOTE — ED Provider Notes (Signed)
CSN: [id number]     Arrival date & time 04/05/15  1259 History   First MD Initiated Contact with Patient 04/05/15 1309     Chief Complaint  Patient presents with  . Mass   (Consider location/radiation/quality/duration/timing/severity/associated sxs/prior Treatment) HPI  44 year old female with a, get a past medical history including Down syndrome and history of DVT presents to urgent care for evaluation of a right lower leg mass.  Mother reports that she noticed a "bump" on her right lower leg approximately 2 weeks ago. No known inciting factor. No recent fall, trauma, injury. However mother does note that she often injures easily. It doesn't seem to bother her. She denies any associated pain/discomfort. Mother reports that she's been acting like her normal self. No shortness of breath, chest pain, or other associated symptoms. Mother was concerned and reluctantly brought her in for evaluation.  Past Medical History  Diagnosis Date  . Asthma   . Arthritis   . Chicken pox   . RA (rheumatoid arthritis) ([place])   . Ulcerative colitis ([place])   . Deafness in left ear   . Down syndrome   . Blood clot in vein    Past Surgical History  Procedure Laterality Date  . Abdominal hysterectomy  1992    partial  . Foot surgery      x2   Family History  Problem Relation Age of Onset  . Heart disease Mother   . Diabetes Mother   . Heart disease Father   . Diabetes Father   . Alcohol abuse Maternal Grandmother   . Arthritis Maternal Grandmother   . Stroke Maternal Grandmother   . Diabetes Maternal Grandmother   . Alcohol abuse Maternal Grandfather   . Arthritis Maternal Grandfather   . Stroke Maternal Grandfather   . Diabetes Maternal Grandfather    Social History  Substance Use Topics  . Smoking status: Never Smoker   . Smokeless tobacco: None  . Alcohol Use: No   OB History    No data available     Review of Systems  Constitutional: Negative.   Respiratory: Negative for  shortness of breath.   Cardiovascular: Negative for chest pain.  All other systems reviewed and are negative.  Allergies  Albuterol and Erythromycin  Home Medications   Prior to Admission medications   Medication Sig Start Date End Date Taking? Authorizing Provider  benzonatate (TESSALON) 100 MG capsule TAKE 1 CAPSULE BY MOUTH THREE TIMES DAILY AS NEEDED *DO NOT CRUSH* 06/19/14  Yes Jackolyn Confer, MD  budesonide-formoterol (SYMBICORT) 160-4.5 MCG/ACT inhaler INHALE 2 PUFFS BY MOUTH TWICE DAILY FOR BREATHING. *RINSE MOUTH AFTERUSE* 01/21/15  Yes Jackolyn Confer, MD  calcium carbonate (OS-CAL - DOSED IN MG OF ELEMENTAL CALCIUM) 1250 (500 CA) MG tablet TAKE ONE TABLET BY MOUTH EACH DAY. 02/12/15  Yes Jackolyn Confer, MD  calcium carbonate 1250 MG capsule Take 1 capsule (1,250 mg total) by mouth daily. 10/09/14  Yes Jackolyn Confer, MD  carbamide peroxide (DEBROX) 6.5 % otic solution Place 5 drops into both ears once monthly as needed 08/22/14  Yes Rubbie Battiest, NP  Cholecalciferol (VITAMIN D3) 1000 UNITS CAPS TAKE 1 TABLET BY MOUTH ONCE DAILY FOR SUPPLEMENT. 02/12/15  Yes Jackolyn Confer, MD  folic acid (FOLVITE) 1 MG tablet Take 1 tablet (1 mg total) by mouth daily. 04/11/14  Yes Jackolyn Confer, MD  gentamicin ointment (GARAMYCIN) 0.1 % Apply 1 application topically 3 (three) times daily as needed.   Yes  Historical Provider, MD  ibuprofen (ADVIL,MOTRIN) 200 MG tablet Take one tablet by mouth twice a day as needed for pain 03/17/15  Yes Jackolyn Confer, MD  ketoconazole (NIZORAL) 2 % cream Apply 1 application topically daily. 02/11/15  Yes Trula Slade, DPM  lactulose ([place]) 10 GM/15ML solution TAKE 45 MLS BY MOUTH ONCE DAILY AS NEEDED FOR CONSTIPATION. 10/16/13  Yes Jackolyn Confer, MD  levothyroxine (SYNTHROID, LEVOTHROID) 100 MCG tablet TAKE 1 TABLET BY MOUTH DAILY. (THYROID) 11/07/14  Yes Jackolyn Confer, MD  Multiple Vitamin (MULTI-VITAMINS) TABS Take by mouth.   Yes  Historical Provider, MD  Multiple Vitamin (THEREMS) TABS TAKE 1 TABLET BY MOUTH ONCE DAILY FOR SUPPLEMENT. 07/12/14  Yes Jackolyn Confer, MD  omeprazole (PRILOSEC) 20 MG capsule Take 1 capsule (20 mg total) by mouth 2 (two) times daily. DO NOT CRUSH 08/06/14  Yes Jackolyn Confer, MD  ondansetron (ZOFRAN) 4 MG tablet Take by mouth.   Yes Historical Provider, MD  predniSONE (DELTASONE) 2.5 MG tablet TAKE ONE TABLET BY MOUTH EVERY DAY 03/27/15  Yes Jackolyn Confer, MD  Sennosides (SENNA) 8.8 MG/5ML SYRP Take by mouth.   Yes Historical Provider, MD  STOOL SOFTENER 100 MG capsule TAKE 1 CAPSULE BY MOUTH EVERY OTHER DAY *DO NOT CRUSH* 02/12/15  Yes Jackolyn Confer, MD  tiotropium (SPIRIVA HANDIHALER) 18 MCG inhalation capsule CONTENTS OF 1 CAPSULE INHALED BY MOUTH VIA INHALER EVERY 24 HOURS. (BREATHING) 01/21/15  Yes Jackolyn Confer, MD  alendronate (FOSAMAX) 70 MG tablet 1 TABLET BY MOUTH EARLY MORNING 30 MIN. BEFORE FOOD/MEDS W/ 8OZ OF WATER,DON'T LIE DOWN FOR 1HR AFTER TAKING 07/05/14   Jackolyn Confer, MD  levalbuterol [facility]) 0.63 MG/3ML nebulizer solution Take 3 mLs (0.63 mg total) by nebulization 3 (three) times daily as needed for wheezing or shortness of breath. 11/01/13   Jackolyn Confer, MD  methotrexate (RHEUMATREX) 2.5 MG tablet Take 2.5 mg by mouth once a week. Take 7 tablets once a week. Caution:Chemotherapy. Protect from light. 07/19/12   Jackolyn Confer, MD  miconazole (MICOTIN) 2 % powder Apply topically.    Historical Provider, MD  MUCUS RELIEF 400 MG TABS tablet TAKE 1 TABLET BY MOUTH EVERY 4 HOURS AS NEEDED FOR CONGESTION. 06/19/14   Jackolyn Confer, MD  predniSONE (DELTASONE) 5 MG tablet TAKE ONE TABLET BY MOUTH ONCE DAILY. DX.____________________ Patient taking differently: 2.5 mg. TAKE ONE TABLET BY MOUTH ONCE DAILY. DX.____________________ 05/09/14   Jackolyn Confer, MD  SENEXON 8.6 MG tablet TAKE ONE TABLET BY MOUTH AT BEDTIME 02/12/15   Jackolyn Confer, MD   Meds  Ordered and Administered this Visit  Medications - No data to display  BP 90/40 mmHg  Pulse 60  Temp(Src) 98.1 F (36.7 C) (Oral)  Resp 16  Ht 4' 8"  (1.422 m)  Wt 163 lb (73.936 kg)  BMI 36.56 kg/m2  SpO2 98%  LMP 04/11/1981 No data found.   Physical Exam  Constitutional: She appears well-developed. No distress.  HENT:  Head: Normocephalic and atraumatic.  Mouth/Throat: Oropharynx is clear and moist.  Eyes: Conjunctivae are normal.  Neck: Neck supple.  Cardiovascular: Normal rate and regular rhythm.   Pulmonary/Chest: Effort normal. No respiratory distress. She has no wheezes. She has no rales.  Musculoskeletal:  Right lower leg - small palpable nodule located on the right lateral lower leg. No surrounding erythema. No palpable fluctuance. Mildly tender to palpation.  Neurological: She is alert.  Skin: Skin is warm  and dry. No rash noted.  Psychiatric: She has a normal mood and affect.  Vitals reviewed.  ED Course  Procedures (including critical care time)  Labs Review Labs Reviewed - No data to display  Imaging Review No results found.  MDM   1. Mass of lower leg, right    44 year old female with Down syndrome presents for evaluation of a right lower leg mass. Based on her clinical exam this appears to be a superficial thrombus versus a nodule secondary to trauma. No indication for stat ultrasound at this time as this is clearly not a DVT. As this patient is seen regularly at my clinic, I will discuss with PCP and arrange ultrasound on Monday. Advised supportive care with PRN tylenol/ibuprofen & heat.     Coral Spikes, DO 04/05/15 1346

## 2015-04-05 NOTE — ED Notes (Signed)
Patient mom stated notice lump in patient right lower leg x 2 week ago. Per mom concern of blood clots because of patient history.

## 2015-04-07 ENCOUNTER — Other Ambulatory Visit: Payer: Self-pay | Admitting: Internal Medicine

## 2015-04-07 DIAGNOSIS — M79604 Pain in right leg: Secondary | ICD-10-CM

## 2015-04-08 ENCOUNTER — Telehealth: Payer: Self-pay | Admitting: *Deleted

## 2015-04-08 NOTE — Telephone Encounter (Signed)
Melissa, Can you assist in getting this scheduled?  Thanks order is in.

## 2015-04-08 NOTE — Telephone Encounter (Signed)
Patients mother requested a call back, in reference to the ER visit from Saturday. Patient's mother stated that patient was seen by Dr. Lacinda Axon, from there, they talked about the patient having a ultrasound on her leg because of a knot.

## 2015-04-08 NOTE — Telephone Encounter (Signed)
Just needs to be scheduled

## 2015-04-08 NOTE — Telephone Encounter (Signed)
I see there is an order, is there anything else she needs to complete?  Please advise and I will call back.

## 2015-04-11 ENCOUNTER — Ambulatory Visit
Admission: RE | Admit: 2015-04-11 | Discharge: 2015-04-11 | Disposition: A | Discharge status: Home / Self Care | Payer: Medicare Other | Source: Ambulatory Visit | Attending: Internal Medicine | Admitting: Internal Medicine

## 2015-04-11 DIAGNOSIS — M79604 Pain in right leg: Secondary | ICD-10-CM | POA: Diagnosis not present

## 2015-04-16 ENCOUNTER — Telehealth: Payer: Self-pay | Admitting: *Deleted

## 2015-04-16 NOTE — Telephone Encounter (Signed)
Patients mother requested results from Ultrasound.

## 2015-04-16 NOTE — Telephone Encounter (Signed)
Left a message to have mother call me back.

## 2015-04-17 NOTE — Telephone Encounter (Signed)
Spoke with the patient's mother regarding her [place] results.

## 2015-04-21 ENCOUNTER — Other Ambulatory Visit: Payer: Self-pay | Admitting: Internal Medicine

## 2015-04-22 ENCOUNTER — Ambulatory Visit (INDEPENDENT_AMBULATORY_CARE_PROVIDER_SITE_OTHER): Payer: Medicare Other | Admitting: Sports Medicine

## 2015-04-22 DIAGNOSIS — L84 Corns and callosities: Secondary | ICD-10-CM | POA: Diagnosis not present

## 2015-04-22 DIAGNOSIS — B353 Tinea pedis: Secondary | ICD-10-CM

## 2015-04-22 DIAGNOSIS — B351 Tinea unguium: Secondary | ICD-10-CM | POA: Diagnosis not present

## 2015-04-22 DIAGNOSIS — M79673 Pain in unspecified foot: Secondary | ICD-10-CM | POA: Diagnosis not present

## 2015-04-22 NOTE — Progress Notes (Signed)
Patient ID: Cindy Oconnor, female   DOB: [birth year], 44 y.o.   MRN: [id number] Subjective: Cindy Oconnor is a 44 y.o. female patient seen today in office with complaint of painful thickened and elongated toenails; unable to trim and callus skin. Patient denies history of Diabetes, Neuropathy, or Vascular disease. Admits to hx of RA. Reports that she has been using cream to bottom of feet with improvement. Patient has no other pedal complaints at this time.   Patient Active Problem List   Diagnosis Date Noted  . Aphthous ulcer of mouth 03/20/2013  . Routine general medical examination at a health care facility 01/24/2013  . Nonspecific abnormal electrocardiogram (ECG) (EKG) 01/24/2013  . Obesity (BMI 30-39.9) 01/21/2012  . Recurrent boils 03/12/2009  . ULCERATIVE COLITIS 06/11/2006  . Rheumatoid arthritis ([place]) 06/11/2006  . OSTEOPENIA 06/11/2006  . HYSTERECTOMY, HX OF 06/11/2006  . Hypothyroidism 03/18/2006  . Asthma, chronic 03/18/2006  . GERD 03/18/2006  . DOWN'S SYNDROME 03/18/2006   Current Outpatient Prescriptions on File Prior to Visit  Medication Sig Dispense Refill  . alendronate (FOSAMAX) 70 MG tablet 1 TABLET BY MOUTH EARLY MORNING 30 MIN. BEFORE FOOD/MEDS W/ 8OZ OF WATER,DON'T LIE DOWN FOR 1HR AFTER TAKING 4 tablet 11  . benzonatate (TESSALON) 100 MG capsule TAKE 1 CAPSULE BY MOUTH THREE TIMES DAILY AS NEEDED *DO NOT CRUSH* 30 capsule 0  . budesonide-formoterol (SYMBICORT) 160-4.5 MCG/ACT inhaler INHALE 2 PUFFS BY MOUTH TWICE DAILY FOR BREATHING. *RINSE MOUTH AFTERUSE* 10.2 g 2  . calcium carbonate (OS-CAL - DOSED IN MG OF ELEMENTAL CALCIUM) 1250 (500 CA) MG tablet TAKE ONE TABLET BY MOUTH EACH DAY. 30 tablet 0  . calcium carbonate 1250 MG capsule Take 1 capsule (1,250 mg total) by mouth daily. 30 capsule 3  . carbamide peroxide (DEBROX) 6.5 % otic solution Place 5 drops into both ears once monthly as needed 15 mL 2  . Cholecalciferol (VITAMIN D3) 1000 UNITS CAPS TAKE 1  TABLET BY MOUTH ONCE DAILY FOR SUPPLEMENT. 30 each 0  . folic acid (FOLVITE) 1 MG tablet Take 1 tablet (1 mg total) by mouth daily. 30 tablet 5  . gentamicin ointment (GARAMYCIN) 0.1 % Apply 1 application topically 3 (three) times daily as needed.    Marland Kitchen ibuprofen (ADVIL,MOTRIN) 200 MG tablet Take one tablet by mouth twice a day as needed for pain 30 tablet 0  . ketoconazole (NIZORAL) 2 % cream Apply 1 application topically daily. 60 g 2  . lactulose (CHRONULAC) 10 GM/15ML solution TAKE 45 MLS BY MOUTH ONCE DAILY AS NEEDED FOR CONSTIPATION. 473 mL 0  . levalbuterol (XOPENEX) 0.63 MG/3ML nebulizer solution Take 3 mLs (0.63 mg total) by nebulization 3 (three) times daily as needed for wheezing or shortness of breath. 3 mL 6  . levothyroxine (SYNTHROID, LEVOTHROID) 100 MCG tablet TAKE 1 TABLET BY MOUTH DAILY. (THYROID) 30 tablet 0  . methotrexate (RHEUMATREX) 2.5 MG tablet Take 2.5 mg by mouth once a week. Take 7 tablets once a week. Caution:Chemotherapy. Protect from light.    . miconazole (MICOTIN) 2 % powder Apply topically.    Marland Kitchen MUCUS RELIEF 400 MG TABS tablet TAKE 1 TABLET BY MOUTH EVERY 4 HOURS AS NEEDED FOR CONGESTION. 30 tablet 1  . Multiple Vitamin (MULTI-VITAMINS) TABS Take by mouth.    . Multiple Vitamin (THEREMS) TABS TAKE 1 TABLET BY MOUTH ONCE DAILY FOR SUPPLEMENT. 30 tablet 6  . omeprazole (PRILOSEC) 20 MG capsule Take 1 capsule (20 mg total) by mouth 2 (two)  times daily. DO NOT CRUSH 180 capsule 3  . ondansetron (ZOFRAN) 4 MG tablet Take by mouth.    . predniSONE (DELTASONE) 2.5 MG tablet TAKE ONE TABLET BY MOUTH EVERY DAY 30 tablet 0  . predniSONE (DELTASONE) 5 MG tablet TAKE ONE TABLET BY MOUTH ONCE DAILY. DX.____________________ (Patient taking differently: 2.5 mg. TAKE ONE TABLET BY MOUTH ONCE DAILY. DX.____________________) 30 tablet 1  . SENEXON 8.6 MG tablet TAKE ONE TABLET BY MOUTH AT BEDTIME 30 tablet 0  . Sennosides (SENNA) 8.8 MG/5ML SYRP Take by mouth.    . STOOL SOFTENER  100 MG capsule TAKE 1 CAPSULE BY MOUTH EVERY OTHER DAY *DO NOT CRUSH* 15 capsule 0  . tiotropium (SPIRIVA HANDIHALER) 18 MCG inhalation capsule CONTENTS OF 1 CAPSULE INHALED BY MOUTH VIA INHALER EVERY 24 HOURS. (BREATHING) 30 capsule 3   No current facility-administered medications on file prior to visit.   Allergies  Allergen Reactions  . Albuterol Other (See Comments)    "gittery"  . Erythromycin        Objective: Physical Exam  General: Well developed, nourished, no acute distress, awake, alert and oriented x 3  Vascular: Dorsalis pedis artery 1/4 bilateral, Posterior tibial artery 2/4 bilateral, skin temperature warm to warm proximal to distal bilateral lower extremities, no varicosities, pedal hair present bilateral.  Neurological: Gross sensation present via light touch bilateral.   Dermatological: Skin is warm, dry, and supple bilateral, Nails 1-10 are tender, long, thick, and discolored with mild subungal debris, no webspace macerations present bilateral, no open lesions present bilateral, + mild scaly right>left plantar sulcus, + hyperkeratotic tissue present bilateral to right heel, right 1st mtpj and left 2nd toe with no signs of infection bilateral.  Musculoskeletal: Asymptomatic hammertoes with mild subluxation deformities noted bilateral. Muscular strength within normal limits without pain or limitation on range of motion. No pain with calf compression bilateral.  Assessment and Plan:  Problem List Items Addressed This Visit    None    Visit Diagnoses    Dermatophytosis of nail    -  Primary    Corns and callosities        Tinea pedis of both feet        Improving with Ketoconazole    Foot pain, unspecified laterality           -Examined patient.  -Discussed treatment options for painful mycotic nails and callus skin. -Mechanically debrided and reduced mycotic nails with sterile nail nipper and dremel nail file without incident. -Debrided callus x 3 using  sterile chisel blade without incident.  -Recommend continue with Ketoconazole daily until scaly skin is completely resolved -Continue with good supportive shoes daily -Patient to return in 3 months for follow up evaluation or sooner if symptoms worsen.  Landis Martins, DPM

## 2015-05-07 ENCOUNTER — Other Ambulatory Visit: Payer: Self-pay

## 2015-05-07 ENCOUNTER — Other Ambulatory Visit: Payer: Self-pay | Admitting: Internal Medicine

## 2015-05-16 ENCOUNTER — Other Ambulatory Visit: Payer: Self-pay | Admitting: Internal Medicine

## 2015-05-19 ENCOUNTER — Ambulatory Visit (INDEPENDENT_AMBULATORY_CARE_PROVIDER_SITE_OTHER): Payer: Medicare Other | Admitting: Internal Medicine

## 2015-05-19 ENCOUNTER — Encounter: Payer: Self-pay | Admitting: Internal Medicine

## 2015-05-19 ENCOUNTER — Telehealth: Payer: Self-pay | Admitting: *Deleted

## 2015-05-19 VITALS — BP 102/80 | HR 68 | Temp 97.8°F | Resp 18 | Ht <= 58 in | Wt 164.8 lb

## 2015-05-19 DIAGNOSIS — Z1239 Encounter for other screening for malignant neoplasm of breast: Secondary | ICD-10-CM

## 2015-05-19 DIAGNOSIS — E039 Hypothyroidism, unspecified: Secondary | ICD-10-CM | POA: Diagnosis not present

## 2015-05-19 DIAGNOSIS — Z0000 Encounter for general adult medical examination without abnormal findings: Secondary | ICD-10-CM

## 2015-05-19 LAB — COMPREHENSIVE METABOLIC PANEL
ALBUMIN: 3.7 g/dL (ref 3.5–5.2)
ALK PHOS: 58 U/L (ref 39–117)
ALT: 24 U/L (ref 0–35)
AST: 32 U/L (ref 0–37)
BUN: 13 mg/dL (ref 6–23)
CALCIUM: 9.4 mg/dL (ref 8.4–10.5)
CO2: 29 mEq/L (ref 19–32)
Chloride: 106 mEq/L (ref 96–112)
Creatinine, Ser: 0.84 mg/dL (ref 0.40–1.20)
GFR: 77.33 mL/min (ref >60.00)
Glucose, Bld: 96 mg/dL (ref 70–99)
POTASSIUM: 3.9 meq/L (ref 3.5–5.1)
Sodium: 143 mEq/L (ref 135–145)
TOTAL PROTEIN: 6.5 g/dL (ref 6.0–8.3)
Total Bilirubin: 0.5 mg/dL (ref 0.2–1.2)

## 2015-05-19 LAB — LIPID PANEL
CHOLESTEROL: 174 mg/dL (ref 0–200)
HDL: 47.5 mg/dL (ref >39.00)
LDL Cholesterol: 107 mg/dL — ABNORMAL HIGH (ref 0–99)
NONHDL: 126.15
TRIGLYCERIDES: 97 mg/dL (ref 0.0–149.0)
Total CHOL/HDL Ratio: 4
VLDL: 19.4 mg/dL (ref 0.0–40.0)

## 2015-05-19 LAB — CBC WITH DIFFERENTIAL/PLATELET
BASOS PCT: 0.5 % (ref 0.0–3.0)
Basophils Absolute: 0 10*3/uL (ref 0.0–0.1)
EOS PCT: 0.8 % (ref 0.0–5.0)
Eosinophils Absolute: 0.1 10*3/uL (ref 0.0–0.7)
HCT: 40.4 % (ref 36.0–46.0)
HEMOGLOBIN: 13.1 g/dL (ref 12.0–15.0)
Lymphocytes Relative: 12 % (ref 12.0–46.0)
Lymphs Abs: 0.9 10*3/uL (ref 0.7–4.0)
MCHC: 32.5 g/dL (ref 30.0–36.0)
MCV: 104.1 fl — AB (ref 78.0–100.0)
MONOS PCT: 4 % (ref 3.0–12.0)
Monocytes Absolute: 0.3 10*3/uL (ref 0.1–1.0)
Neutro Abs: 6.2 10*3/uL (ref 1.4–7.7)
Neutrophils Relative %: 82.7 % — ABNORMAL HIGH (ref 43.0–77.0)
Platelets: 353 10*3/uL (ref 150.0–400.0)
RBC: 3.88 Mil/uL (ref 3.87–5.11)
RDW: 18.3 % — AB (ref 11.5–15.5)
WBC: 7.5 10*3/uL (ref 4.0–10.5)

## 2015-05-19 LAB — TSH: TSH: 7.17 u[IU]/mL — AB (ref 0.35–4.50)

## 2015-05-19 NOTE — Telephone Encounter (Signed)
Lab results faxed over to [phone] attention [name]

## 2015-05-19 NOTE — Telephone Encounter (Signed)
Results faxed.

## 2015-05-19 NOTE — Progress Notes (Signed)
Pre-visit discussion using our clinic review tool. No additional management support is needed unless otherwise documented below in the visit note.  

## 2015-05-19 NOTE — Patient Instructions (Signed)
Health Maintenance, Female Adopting a healthy lifestyle and getting preventive care can go a long way to promote health and wellness. Talk with your health care provider about what schedule of regular examinations is right for you. This is a good chance for you to check in with your provider about disease prevention and staying healthy. In between checkups, there are plenty of things you can do on your own. Experts have done a lot of research about which lifestyle changes and preventive measures are most likely to keep you healthy. Ask your health care provider for more information. WEIGHT AND DIET  Eat a healthy diet  Be sure to include plenty of vegetables, fruits, low-fat dairy products, and lean protein.  Do not eat a lot of foods high in solid fats, added sugars, or salt.  Get regular exercise. This is one of the most important things you can do for your health.  Most adults should exercise for at least 150 minutes each week. The exercise should increase your heart rate and make you sweat (moderate-intensity exercise).  Most adults should also do strengthening exercises at least twice a week. This is in addition to the moderate-intensity exercise.  Maintain a healthy weight  Body mass index (BMI) is a measurement that can be used to identify possible weight problems. It estimates body fat based on height and weight. Your health care provider can help determine your BMI and help you achieve or maintain a healthy weight.  For females 20 years of age and older:   A BMI below 18.5 is considered underweight.  A BMI of 18.5 to 24.9 is normal.  A BMI of 25 to 29.9 is considered overweight.  A BMI of 30 and above is considered obese.  Watch levels of cholesterol and blood lipids  You should start having your blood tested for lipids and cholesterol at 46 years of age, then have this test every 5 years.  You may need to have your cholesterol levels checked more often if:  Your lipid  or cholesterol levels are high.  You are older than 46 years of age.  You are at high risk for heart disease.  CANCER SCREENING   Lung Cancer  Lung cancer screening is recommended for adults 55-80 years old who are at high risk for lung cancer because of a history of smoking.  A yearly low-dose CT scan of the lungs is recommended for people who:  Currently smoke.  Have quit within the past 15 years.  Have at least a 30-pack-year history of smoking. A pack year is smoking an average of one pack of cigarettes a day for 1 year.  Yearly screening should continue until it has been 15 years since you quit.  Yearly screening should stop if you develop a health problem that would prevent you from having lung cancer treatment.  Breast Cancer  Practice breast self-awareness. This means understanding how your breasts normally appear and feel.  It also means doing regular breast self-exams. Let your health care provider know about any changes, no matter how small.  If you are in your 20s or 30s, you should have a clinical breast exam (CBE) by a health care provider every 1-3 years as part of a regular health exam.  If you are 40 or older, have a CBE every year. Also consider having a breast X-ray (mammogram) every year.  If you have a family history of breast cancer, talk to your health care provider about genetic screening.  If you   are at high risk for breast cancer, talk to your health care provider about having an MRI and a mammogram every year.  Breast cancer gene (BRCA) assessment is recommended for women who have family members with BRCA-related cancers. BRCA-related cancers include:  Breast.  Ovarian.  Tubal.  Peritoneal cancers.  Results of the assessment will determine the need for genetic counseling and BRCA1 and BRCA2 testing. Cervical Cancer Your health care provider may recommend that you be screened regularly for cancer of the pelvic organs (ovaries, uterus, and  vagina). This screening involves a pelvic examination, including checking for microscopic changes to the surface of your cervix (Pap test). You may be encouraged to have this screening done every 3 years, beginning at age 21.  For women ages 30-65, health care providers may recommend pelvic exams and Pap testing every 3 years, or they may recommend the Pap and pelvic exam, combined with testing for human papilloma virus (HPV), every 5 years. Some types of HPV increase your risk of cervical cancer. Testing for HPV may also be done on women of any age with unclear Pap test results.  Other health care providers may not recommend any screening for nonpregnant women who are considered low risk for pelvic cancer and who do not have symptoms. Ask your health care provider if a screening pelvic exam is right for you.  If you have had past treatment for cervical cancer or a condition that could lead to cancer, you need Pap tests and screening for cancer for at least 20 years after your treatment. If Pap tests have been discontinued, your risk factors (such as having a new sexual partner) need to be reassessed to determine if screening should resume. Some women have medical problems that increase the chance of getting cervical cancer. In these cases, your health care provider may recommend more frequent screening and Pap tests. Colorectal Cancer  This type of cancer can be detected and often prevented.  Routine colorectal cancer screening usually begins at 46 years of age and continues through 46 years of age.  Your health care provider may recommend screening at an earlier age if you have risk factors for colon cancer.  Your health care provider may also recommend using home test kits to check for hidden blood in the stool.  A small camera at the end of a tube can be used to examine your colon directly (sigmoidoscopy or colonoscopy). This is done to check for the earliest forms of colorectal  cancer.  Routine screening usually begins at age 50.  Direct examination of the colon should be repeated every 5-10 years through 46 years of age. However, you may need to be screened more often if early forms of precancerous polyps or small growths are found. Skin Cancer  Check your skin from head to toe regularly.  Tell your health care provider about any new moles or changes in moles, especially if there is a change in a mole's shape or color.  Also tell your health care provider if you have a mole that is larger than the size of a pencil eraser.  Always use sunscreen. Apply sunscreen liberally and repeatedly throughout the day.  Protect yourself by wearing long sleeves, pants, a wide-brimmed hat, and sunglasses whenever you are outside. HEART DISEASE, DIABETES, AND HIGH BLOOD PRESSURE   High blood pressure causes heart disease and increases the risk of stroke. High blood pressure is more likely to develop in:  People who have blood pressure in the high end   of the normal range (130-139/85-89 mm Hg).  People who are overweight or obese.  People who are African American.  If you are 38-23 years of age, have your blood pressure checked every 3-5 years. If you are 61 years of age or older, have your blood pressure checked every year. You should have your blood pressure measured twice--once when you are at a hospital or clinic, and once when you are not at a hospital or clinic. Record the average of the two measurements. To check your blood pressure when you are not at a hospital or clinic, you can use:  An automated blood pressure machine at a pharmacy.  A home blood pressure monitor.  If you are between 45 years and 39 years old, ask your health care provider if you should take aspirin to prevent strokes.  Have regular diabetes screenings. This involves taking a blood sample to check your fasting blood sugar level.  If you are at a normal weight and have a low risk for diabetes,  have this test once every three years after 46 years of age.  If you are overweight and have a high risk for diabetes, consider being tested at a younger age or more often. PREVENTING INFECTION  Hepatitis B  If you have a higher risk for hepatitis B, you should be screened for this virus. You are considered at high risk for hepatitis B if:  You were born in a country where hepatitis B is common. Ask your health care provider which countries are considered high risk.  Your parents were born in a high-risk country, and you have not been immunized against hepatitis B (hepatitis B vaccine).  You have HIV or AIDS.  You use needles to inject street drugs.  You live with someone who has hepatitis B.  You have had sex with someone who has hepatitis B.  You get hemodialysis treatment.  You take certain medicines for conditions, including cancer, organ transplantation, and autoimmune conditions. Hepatitis C  Blood testing is recommended for:  Everyone born from [birth year] through 1967.  Anyone with known risk factors for hepatitis C. Sexually transmitted infections (STIs)  You should be screened for sexually transmitted infections (STIs) including gonorrhea and chlamydia if:  You are sexually active and are younger than 46 years of age.  You are older than 46 years of age and your health care provider tells you that you are at risk for this type of infection.  Your sexual activity has changed since you were last screened and you are at an increased risk for chlamydia or gonorrhea. Ask your health care provider if you are at risk.  If you do not have HIV, but are at risk, it may be recommended that you take a prescription medicine daily to prevent HIV infection. This is called pre-exposure prophylaxis (PrEP). You are considered at risk if:  You are sexually active and do not regularly use condoms or know the HIV status of your partner(s).  You take drugs by injection.  You are sexually  active with a partner who has HIV. Talk with your health care provider about whether you are at high risk of being infected with HIV. If you choose to begin PrEP, you should first be tested for HIV. You should then be tested every 3 months for as long as you are taking PrEP.  PREGNANCY   If you are premenopausal and you may become pregnant, ask your health care provider about preconception counseling.  If you may  become pregnant, take 400 to 800 micrograms (mcg) of folic acid every day.  If you want to prevent pregnancy, talk to your health care provider about birth control (contraception). OSTEOPOROSIS AND MENOPAUSE   Osteoporosis is a disease in which the bones lose minerals and strength with aging. This can result in serious bone fractures. Your risk for osteoporosis can be identified using a bone density scan.  If you are 61 years of age or older, or if you are at risk for osteoporosis and fractures, ask your health care provider if you should be screened.  Ask your health care provider whether you should take a calcium or vitamin D supplement to lower your risk for osteoporosis.  Menopause may have certain physical symptoms and risks.  Hormone replacement therapy may reduce some of these symptoms and risks. Talk to your health care provider about whether hormone replacement therapy is right for you.  HOME CARE INSTRUCTIONS   Schedule regular health, dental, and eye exams.  Stay current with your immunizations.   Do not use any tobacco products including cigarettes, chewing tobacco, or electronic cigarettes.  If you are pregnant, do not drink alcohol.  If you are breastfeeding, limit how much and how often you drink alcohol.  Limit alcohol intake to no more than 1 drink per day for nonpregnant women. One drink equals 12 ounces of beer, 5 ounces of wine, or 1 ounces of hard liquor.  Do not use street drugs.  Do not share needles.  Ask your health care provider for help if  you need support or information about quitting drugs.  Tell your health care provider if you often feel depressed.  Tell your health care provider if you have ever been abused or do not feel safe at home.   This information is not intended to replace advice given to you by your health care provider. Make sure you discuss any questions you have with your health care provider.   Document Released: 11/30/2010 Document Revised: 06/07/2014 Document Reviewed: 04/18/2013 Elsevier Interactive Patient Education [employer].

## 2015-05-19 NOTE — Progress Notes (Signed)
Subjective:    Patient ID: Cindy Oconnor, female    DOB: June 09, 1970, 44 y.o.   MRN: [id number]  HPI  44YO female presents for physical exam. She is with her caregiver from the group home.  Continues to live in group home. No concerns today. Prednisone dose was decreased this summer. Tolerated well. She has lost 5lbs. No changes to diet. Continues to drink soda.    Wt Readings from Last 3 Encounters:  05/19/15 164 lb 12 oz (74.73 kg)  04/05/15 163 lb (73.936 kg)  02/27/15 169 lb 6 oz (76.828 kg)   BP Readings from Last 3 Encounters:  05/19/15 102/80  04/05/15 90/40  02/27/15 107/67    Past Medical History  Diagnosis Date  . Asthma   . Arthritis   . Chicken pox   . RA (rheumatoid arthritis) ([place])   . Ulcerative colitis ([place])   . Deafness in left ear   . Down syndrome   . Blood clot in vein    Family History  Problem Relation Age of Onset  . Heart disease Mother   . Diabetes Mother   . Heart disease Father   . Diabetes Father   . Alcohol abuse Maternal Grandmother   . Arthritis Maternal Grandmother   . Stroke Maternal Grandmother   . Diabetes Maternal Grandmother   . Alcohol abuse Maternal Grandfather   . Arthritis Maternal Grandfather   . Stroke Maternal Grandfather   . Diabetes Maternal Grandfather    Past Surgical History  Procedure Laterality Date  . Abdominal hysterectomy  1992    partial  . Foot surgery      x2   Social History   Social History  . Marital Status: Single    Spouse Name: N/A  . Number of Children: N/A  . Years of Education: N/A   Social History Main Topics  . Smoking status: Never Smoker   . Smokeless tobacco: None  . Alcohol Use: No  . Drug Use: None  . Sexual Activity: Not Asked   Other Topics Concern  . None   Social History Narrative   Lives at [facility] group home.    Review of Systems  Constitutional: Negative for fever, chills, appetite change, fatigue and unexpected weight change.    Eyes: Negative for visual disturbance.  Respiratory: Negative for shortness of breath and wheezing.   Cardiovascular: Negative for chest pain, palpitations and leg swelling.  Gastrointestinal: Negative for nausea, vomiting, abdominal pain, diarrhea and constipation.  Musculoskeletal: Negative for myalgias and arthralgias.  Skin: Negative for color change and rash.  Neurological: Negative for weakness.  Hematological: Negative for adenopathy. Does not bruise/bleed easily.  Psychiatric/Behavioral: Negative for sleep disturbance and dysphoric mood. The patient is not nervous/anxious.        Objective:    BP 102/80 mmHg  Pulse 68  Temp(Src) 97.8 F (36.6 C) (Oral)  Resp 18  Ht 4' 7"  (1.397 m)  Wt 164 lb 12 oz (74.73 kg)  BMI 38.29 kg/m2  SpO2 96%  LMP 04/11/1981 Physical Exam  Constitutional: She is oriented to person, place, and time. She appears well-developed and well-nourished. No distress.  Female with Down Syndrome NAD  HENT:  Head: Normocephalic and atraumatic.  Right Ear: External ear normal.  Left Ear: External ear normal.  Nose: Nose normal.  Mouth/Throat: Oropharynx is clear and moist. No oropharyngeal exudate.  Eyes: Conjunctivae are normal. Pupils are equal, round, and reactive to light. Right eye exhibits no discharge. Left  eye exhibits no discharge. No scleral icterus.  Neck: Normal range of motion. Neck supple. No tracheal deviation present. No thyromegaly present.  Cardiovascular: Normal rate, regular rhythm, normal heart sounds and intact distal pulses.  Exam reveals no gallop and no friction rub.   No murmur heard. Pulmonary/Chest: Effort normal and breath sounds normal. No accessory muscle usage. No tachypnea. No respiratory distress. She has no decreased breath sounds. She has no wheezes. She has no rales. She exhibits no tenderness. Right breast exhibits no inverted nipple, no mass, no nipple discharge, no skin change and no tenderness. Left breast exhibits no  inverted nipple, no mass, no nipple discharge, no skin change and no tenderness. Breasts are symmetrical.  Abdominal: Soft. Bowel sounds are normal. She exhibits no distension and no mass. There is no tenderness. There is no rebound and no guarding.  Musculoskeletal: Normal range of motion. She exhibits no edema or tenderness.  Lymphadenopathy:    She has no cervical adenopathy.  Neurological: She is alert and oriented to person, place, and time. No cranial nerve deficit. She exhibits normal muscle tone. Coordination normal.  Skin: Skin is warm and dry. No rash noted. She is not diaphoretic. No erythema. No pallor.  Psychiatric: She has a normal mood and affect. Her behavior is normal. Judgment and thought content normal. Cognition and memory are impaired.          Assessment & Plan:   Problem List Items Addressed This Visit      Unprioritized   Hypothyroidism   Routine general medical examination at a health care facility - Primary    General medical exam including breast exam normal today. PAP and pelvic deferred as pt s/p hysterectomy. Immunizations are UTD. Labs today CBC, CMP, lipids, TSH. Mammogram scheduled. Encouraged healthy diet and exercise.        Relevant Orders   CBC with Differential/Platelet   Comprehensive metabolic panel   Lipid panel   TSH    Other Visit Diagnoses    Screening breast examination        Relevant Orders    MM DIGITAL SCREENING BILATERAL        Return in about 6 months (around 11/17/2015) for Recheck.

## 2015-05-19 NOTE — Assessment & Plan Note (Signed)
General medical exam including breast exam normal today. PAP and pelvic deferred as pt s/p hysterectomy. Immunizations are UTD. Labs today CBC, CMP, lipids, TSH. Mammogram scheduled. Encouraged healthy diet and exercise.

## 2015-05-22 ENCOUNTER — Other Ambulatory Visit: Payer: Self-pay | Admitting: *Deleted

## 2015-05-22 MED ORDER — LEVOTHYROXINE SODIUM 125 MCG PO TABS: 125.0000 ug | ORAL_TABLET | Freq: Every day | ORAL | Status: DC

## 2015-05-28 ENCOUNTER — Ambulatory Visit: Payer: Medicare Other

## 2015-06-05 ENCOUNTER — Telehealth: Payer: Self-pay

## 2015-06-05 NOTE — Telephone Encounter (Signed)
Started PA process for Ondansetron Form being faxed to the office.

## 2015-06-06 NOTE — Telephone Encounter (Signed)
Faxed forms back to [facility].  Awaiting approval/denial

## 2015-06-17 ENCOUNTER — Telehealth: Payer: Self-pay

## 2015-06-17 NOTE — Telephone Encounter (Signed)
Pt's group home stated that they need to send a letter discontinuing synthroid 185m sent to [facility]. A letter was created and faxed to [employer]./tvw

## 2015-06-19 ENCOUNTER — Other Ambulatory Visit: Payer: Self-pay | Admitting: Internal Medicine

## 2015-06-19 MED ORDER — KETOCONAZOLE 2 % EX CREA: 1.0000 "application " | TOPICAL_CREAM | Freq: Every day | CUTANEOUS | Status: DC

## 2015-06-19 MED ORDER — TIOTROPIUM BROMIDE MONOHYDRATE 18 MCG IN CAPS: ORAL_CAPSULE | RESPIRATORY_TRACT | Status: DC

## 2015-06-19 MED ORDER — BUDESONIDE-FORMOTEROL FUMARATE 160-4.5 MCG/ACT IN AERO: INHALATION_SPRAY | RESPIRATORY_TRACT | Status: DC

## 2015-06-19 MED ORDER — CALCIUM CARBONATE 1250 (500 CA) MG PO CAPS: 1250.0000 mg | ORAL_CAPSULE | Freq: Every day | ORAL | Status: DC

## 2015-06-19 MED ORDER — THEREMS PO TABS: ORAL_TABLET | ORAL | Status: DC

## 2015-06-19 MED ORDER — SENNOSIDES 8.6 MG PO TABS: 1.0000 | ORAL_TABLET | Freq: Every day | ORAL | Status: DC

## 2015-06-19 NOTE — Telephone Encounter (Signed)
Pt GEXBM [phone] manager of the group home called to get pt medication refilled tiotropium (SPIRIVA HANDIHALER) 18 MCG inhalation capsule, folic acid (FOLVITE) 1 MG tablet, Multiple Vitamin (THEREMS) TABS, calcium carbonate 1250 MG capsule, ketoconazole (NIZORAL) 2 % cream, SYMBICORT 160-4.5 MCG/ACT inhaler, SENEXON 8.6 MG tablet. Pharmacy is [facility], [place]. Thank you!

## 2015-06-19 NOTE — Telephone Encounter (Signed)
Request completed.

## 2015-06-24 ENCOUNTER — Ambulatory Visit: Payer: Medicare Other | Admitting: Sports Medicine

## 2015-06-30 DIAGNOSIS — J4541 Moderate persistent asthma with (acute) exacerbation: Secondary | ICD-10-CM | POA: Diagnosis not present

## 2015-07-01 ENCOUNTER — Ambulatory Visit: Payer: Medicare Other | Admitting: Family Medicine

## 2015-07-04 ENCOUNTER — Other Ambulatory Visit: Payer: Self-pay

## 2015-07-04 ENCOUNTER — Telehealth: Payer: Self-pay | Admitting: Internal Medicine

## 2015-07-04 MED ORDER — LEVALBUTEROL HCL 0.63 MG/3ML IN NEBU: 0.6300 mg | INHALATION_SOLUTION | Freq: Three times a day (TID) | RESPIRATORY_TRACT | Status: DC | PRN

## 2015-07-04 NOTE — Telephone Encounter (Signed)
No this is refilled by her rheumatologist

## 2015-07-04 NOTE — Telephone Encounter (Signed)
Is this med that you currently refill on this pt? Last filled 07/19/12. Please advise

## 2015-07-04 NOTE — Telephone Encounter (Signed)
Cont' levalbuterol (XOPENEX) 0.63 MG/3ML nebulizer solution needs this prescription. Thank you!

## 2015-07-04 NOTE — Telephone Encounter (Signed)
Pt care giver called about all of her medications was called in on 06/19/15 except the budesonide-formoterol (SYMBICORT) 160-4.5 MCG/ACT inhaler and methotrexate (RHEUMATREX) 2.5 MG tablet Pharmacy is [facility], [place]. Call pt @ [phone]. Thank you!

## 2015-07-04 NOTE — Telephone Encounter (Signed)
All filled but the rheumatrex

## 2015-07-15 ENCOUNTER — Ambulatory Visit (INDEPENDENT_AMBULATORY_CARE_PROVIDER_SITE_OTHER): Payer: Medicare Other | Admitting: Sports Medicine

## 2015-07-15 ENCOUNTER — Encounter: Payer: Self-pay | Admitting: Sports Medicine

## 2015-07-15 DIAGNOSIS — L84 Corns and callosities: Secondary | ICD-10-CM | POA: Diagnosis not present

## 2015-07-15 DIAGNOSIS — B351 Tinea unguium: Secondary | ICD-10-CM

## 2015-07-15 DIAGNOSIS — M79673 Pain in unspecified foot: Secondary | ICD-10-CM | POA: Diagnosis not present

## 2015-07-15 DIAGNOSIS — B353 Tinea pedis: Secondary | ICD-10-CM | POA: Diagnosis not present

## 2015-07-15 NOTE — Progress Notes (Signed)
Patient ID: Cindy Oconnor, female   DOB: [birth year], 45 y.o.   MRN: [id number]  Subjective: Cindy Oconnor is a 45 y.o. female patient seen today in office with complaint of painful thickened and elongated toenails; unable to trim and callus skin. Reports that she has been using cream to bottom of feet with improvement. Patient has no other pedal complaints at this time.   Patient Active Problem List   Diagnosis Date Noted  . Routine general medical examination at a health care facility 01/24/2013  . Nonspecific abnormal electrocardiogram (ECG) (EKG) 01/24/2013  . Obesity (BMI 30-39.9) 01/21/2012  . Recurrent boils 03/12/2009  . ULCERATIVE COLITIS 06/11/2006  . Rheumatoid arthritis ([place]) 06/11/2006  . OSTEOPENIA 06/11/2006  . HYSTERECTOMY, HX OF 06/11/2006  . Hypothyroidism 03/18/2006  . Asthma, chronic 03/18/2006  . GERD 03/18/2006  . DOWN'S SYNDROME 03/18/2006   Current Outpatient Prescriptions on File Prior to Visit  Medication Sig Dispense Refill  . alendronate (FOSAMAX) 70 MG tablet 1 TABLET BY MOUTH EARLY MORNING 30 MIN. BEFORE FOOD/MEDS W/ 8OZ OF WATER,DON'T LIE DOWN FOR 1HR AFTER TAKING 1 tablet 0  . benzonatate (TESSALON) 100 MG capsule TAKE 1 CAPSULE BY MOUTH THREE TIMES DAILY AS NEEDED *DO NOT CRUSH* 30 capsule 0  . budesonide-formoterol (SYMBICORT) 160-4.5 MCG/ACT inhaler INHALE 2 PUFFS BY MOUTH TWICE DAILY FOR BREATHING. *RINSE MOUTH AFTERUSE* 10.2 g 0  . calcium carbonate (OS-CAL - DOSED IN MG OF ELEMENTAL CALCIUM) 1250 (500 CA) MG tablet TAKE ONE TABLET BY MOUTH EACH DAY. 30 tablet 0  . calcium carbonate 1250 MG capsule Take 1 capsule (1,250 mg total) by mouth daily. 30 capsule 3  . carbamide peroxide (DEBROX) 6.5 % otic solution Place 5 drops into both ears once monthly as needed 15 mL 2  . Cholecalciferol (VITAMIN D3) 1000 UNITS CAPS TAKE 1 TABLET BY MOUTH ONCE DAILY FOR SUPPLEMENT. 30 each 0  . diclofenac sodium (VOLTAREN) 1 % GEL Apply 2g to small joints, 4g to  large joints, up to 4x day as needed. Do not exceed 16gm total per day    . folic acid (FOLVITE) 1 MG tablet Take 1 tablet (1 mg total) by mouth daily. 30 tablet 5  . gentamicin ointment (GARAMYCIN) 0.1 % Apply 1 application topically 3 (three) times daily as needed.    Marland Kitchen ibuprofen (ADVIL,MOTRIN) 200 MG tablet Take one tablet by mouth twice a day as needed for pain 30 tablet 0  . ketoconazole (NIZORAL) 2 % cream Apply 1 application topically daily. 60 g 2  . lactulose (CHRONULAC) 10 GM/15ML solution TAKE 45 MLS BY MOUTH ONCE DAILY AS NEEDED FOR CONSTIPATION. 473 mL 0  . levalbuterol (XOPENEX) 0.63 MG/3ML nebulizer solution Take 3 mLs (0.63 mg total) by nebulization 3 (three) times daily as needed for wheezing or shortness of breath. 3 mL 6  . levothyroxine (SYNTHROID, LEVOTHROID) 125 MCG tablet Take 1 tablet (125 mcg total) by mouth daily. Take in the morning with only water before breakfast 30 tablet 3  . methotrexate (RHEUMATREX) 2.5 MG tablet Take 2.5 mg by mouth once a week. Take 7 tablets once a week. Caution:Chemotherapy. Protect from light.    . MUCUS RELIEF 400 MG TABS tablet TAKE 1 TABLET BY MOUTH EVERY 4 HOURS AS NEEDED FOR CONGESTION. 30 tablet 1  . Multiple Vitamin (MULTI-VITAMINS) TABS Take by mouth.    . Multiple Vitamin (THEREMS) TABS TAKE 1 TABLET BY MOUTH ONCE DAILY FOR SUPPLEMENT. 30 tablet 6  . omeprazole (PRILOSEC) 20  MG capsule Take 1 capsule (20 mg total) by mouth 2 (two) times daily. DO NOT CRUSH 180 capsule 3  . ondansetron (ZOFRAN) 4 MG tablet Take by mouth.    . predniSONE (DELTASONE) 2.5 MG tablet TAKE ONE TABLET BY MOUTH EVERY DAY 30 tablet 0  . senna (SENEXON) 8.6 MG tablet Take 1 tablet (8.6 mg total) by mouth at bedtime. 30 tablet 3  . STOOL SOFTENER 100 MG capsule TAKE 1 CAPSULE BY MOUTH EVERY OTHER DAY *DO NOT CRUSH* 15 capsule 0  . tiotropium (SPIRIVA HANDIHALER) 18 MCG inhalation capsule CONTENTS OF 1 CAPSULE INHALED BY MOUTH VIA INHALER EVERY 24 HOURS. (BREATHING)  30 capsule 3   No current facility-administered medications on file prior to visit.   Allergies  Allergen Reactions  . Albuterol Other (See Comments)    "gittery"  . Erythromycin        Objective: Physical Exam  General: Well developed, nourished, no acute distress, awake, alert and oriented x 3  Vascular: Dorsalis pedis artery 1/4 bilateral, Posterior tibial artery 2/4 bilateral, skin temperature warm to warm proximal to distal bilateral lower extremities, no varicosities, pedal hair present bilateral.  Neurological: Gross sensation present via light touch bilateral.   Dermatological: Skin is warm, dry, and supple bilateral, Nails 1-10 are tender, long, thick, and discolored with mild subungal debris, no webspace macerations present bilateral, no open lesions present bilateral, + mild scaly right>left plantar sulcus improving, + hyperkeratotic tissue present bilateral to right heel, right 1st mtpj and left 2nd toe with no signs of infection bilateral.  Musculoskeletal: Asymptomatic hammertoes with mild subluxation deformities noted bilateral. Muscular strength within normal limits without pain or limitation on range of motion. No pain with calf compression bilateral.  Assessment and Plan:  Problem List Items Addressed This Visit    None    Visit Diagnoses    Dermatophytosis of nail    -  Primary    Corns and callosities        Tinea pedis of both feet        Foot pain, unspecified laterality           -Examined patient.  -Discussed treatment options for painful mycotic nails and callus skin. -Mechanically debrided and reduced mycotic nails with sterile nail nipper and dremel nail file without incident. -Debrided callus x 3 using sterile chisel blade without incident.  -Recommend continue with Ketoconazole daily until scaly skin is completely resolved -Continue with good supportive shoes daily -Patient to return in 3 months for follow up evaluation or sooner if symptoms  worsen.  Landis Martins, DPM

## 2015-07-21 ENCOUNTER — Other Ambulatory Visit: Payer: Self-pay

## 2015-07-21 NOTE — Telephone Encounter (Signed)
Please advise refill. Thanks?

## 2015-07-23 ENCOUNTER — Encounter: Payer: Self-pay | Admitting: Internal Medicine

## 2015-07-23 ENCOUNTER — Inpatient Hospital Stay
Admission: EM | Admit: 2015-07-23 | Discharge: 2015-07-25 | DRG: 202 | Disposition: A | Discharge status: Home / Self Care | Payer: Medicare Other | Attending: Internal Medicine | Admitting: Internal Medicine

## 2015-07-23 ENCOUNTER — Emergency Department: Payer: Medicare Other

## 2015-07-23 ENCOUNTER — Other Ambulatory Visit: Payer: Self-pay

## 2015-07-23 ENCOUNTER — Ambulatory Visit (INDEPENDENT_AMBULATORY_CARE_PROVIDER_SITE_OTHER): Payer: Medicare Other | Admitting: Internal Medicine

## 2015-07-23 VITALS — BP 100/68 | HR 108 | Temp 99.7°F | Ht <= 58 in | Wt 166.0 lb

## 2015-07-23 DIAGNOSIS — J45909 Unspecified asthma, uncomplicated: Secondary | ICD-10-CM

## 2015-07-23 DIAGNOSIS — R5383 Other fatigue: Secondary | ICD-10-CM | POA: Diagnosis not present

## 2015-07-23 DIAGNOSIS — Z881 Allergy status to other antibiotic agents status: Secondary | ICD-10-CM

## 2015-07-23 DIAGNOSIS — Z888 Allergy status to other drugs, medicaments and biological substances status: Secondary | ICD-10-CM | POA: Diagnosis not present

## 2015-07-23 DIAGNOSIS — Z9981 Dependence on supplemental oxygen: Secondary | ICD-10-CM | POA: Diagnosis not present

## 2015-07-23 DIAGNOSIS — J9601 Acute respiratory failure with hypoxia: Secondary | ICD-10-CM | POA: Diagnosis present

## 2015-07-23 DIAGNOSIS — H9192 Unspecified hearing loss, left ear: Secondary | ICD-10-CM | POA: Diagnosis present

## 2015-07-23 DIAGNOSIS — J45901 Unspecified asthma with (acute) exacerbation: Secondary | ICD-10-CM

## 2015-07-23 DIAGNOSIS — J209 Acute bronchitis, unspecified: Secondary | ICD-10-CM | POA: Diagnosis present

## 2015-07-23 DIAGNOSIS — M1990 Unspecified osteoarthritis, unspecified site: Secondary | ICD-10-CM | POA: Diagnosis present

## 2015-07-23 DIAGNOSIS — J40 Bronchitis, not specified as acute or chronic: Secondary | ICD-10-CM | POA: Diagnosis not present

## 2015-07-23 DIAGNOSIS — I00 Rheumatic fever without heart involvement: Secondary | ICD-10-CM | POA: Diagnosis not present

## 2015-07-23 DIAGNOSIS — Z79899 Other long term (current) drug therapy: Secondary | ICD-10-CM | POA: Diagnosis not present

## 2015-07-23 DIAGNOSIS — R509 Fever, unspecified: Secondary | ICD-10-CM | POA: Diagnosis not present

## 2015-07-23 DIAGNOSIS — J441 Chronic obstructive pulmonary disease with (acute) exacerbation: Secondary | ICD-10-CM | POA: Insufficient documentation

## 2015-07-23 DIAGNOSIS — R0989 Other specified symptoms and signs involving the circulatory and respiratory systems: Secondary | ICD-10-CM | POA: Diagnosis not present

## 2015-07-23 DIAGNOSIS — Q909 Down syndrome, unspecified: Secondary | ICD-10-CM

## 2015-07-23 DIAGNOSIS — M069 Rheumatoid arthritis, unspecified: Secondary | ICD-10-CM | POA: Diagnosis present

## 2015-07-23 DIAGNOSIS — R062 Wheezing: Secondary | ICD-10-CM | POA: Diagnosis not present

## 2015-07-23 LAB — CBC
HCT: 37 % (ref 35.0–47.0)
Hemoglobin: 12.4 g/dL (ref 12.0–16.0)
MCH: 34.9 pg — ABNORMAL HIGH (ref 26.0–34.0)
MCHC: 33.7 g/dL (ref 32.0–36.0)
MCV: 103.6 fL — ABNORMAL HIGH (ref 80.0–100.0)
PLATELETS: 198 10*3/uL (ref 150–440)
RBC: 3.57 MIL/uL — ABNORMAL LOW (ref 3.80–5.20)
RDW: 16.5 % — ABNORMAL HIGH (ref 11.5–14.5)
WBC: 6.9 10*3/uL (ref 3.6–11.0)

## 2015-07-23 LAB — BASIC METABOLIC PANEL
Anion gap: 7 (ref 5–15)
BUN: 11 mg/dL (ref 6–20)
CALCIUM: 8.5 mg/dL — AB (ref 8.9–10.3)
CO2: 27 mmol/L (ref 22–32)
Chloride: 103 mmol/L (ref 101–111)
Creatinine, Ser: 0.9 mg/dL (ref 0.44–1.00)
GFR calc Af Amer: >60 mL/min (ref >60)
Glucose, Bld: 107 mg/dL — ABNORMAL HIGH (ref 65–99)
POTASSIUM: 3.6 mmol/L (ref 3.5–5.1)
SODIUM: 137 mmol/L (ref 135–145)

## 2015-07-23 LAB — RAPID INFLUENZA A&B ANTIGENS (ARMC ONLY): INFLUENZA A (ARMC): NOT DETECTED

## 2015-07-23 LAB — RAPID INFLUENZA A&B ANTIGENS: Influenza B (ARMC): NOT DETECTED

## 2015-07-23 MED ORDER — DOCUSATE SODIUM 100 MG PO CAPS: ORAL_CAPSULE | ORAL | Status: DC

## 2015-07-23 MED ORDER — ONDANSETRON HCL 4 MG/2ML IJ SOLN: 4.0000 mg | Freq: Four times a day (QID) | INTRAMUSCULAR | Status: DC | PRN

## 2015-07-23 MED ORDER — FOLIC ACID 1 MG PO TABS: 1.0000 mg | ORAL_TABLET | Freq: Every day | ORAL | Status: DC

## 2015-07-23 MED ORDER — DOCUSATE SODIUM 100 MG PO CAPS
100.0000 mg | ORAL_CAPSULE | Freq: Two times a day (BID) | ORAL | Status: DC
  Administered 2015-07-23 – 2015-07-25 (×4): 100 mg via ORAL
  Filled 2015-07-23 (×4): qty 1

## 2015-07-23 MED ORDER — FOLIC ACID 1 MG PO TABS
1.0000 mg | ORAL_TABLET | Freq: Every day | ORAL | Status: DC
  Administered 2015-07-24 – 2015-07-25 (×2): 1 mg via ORAL
  Filled 2015-07-23 (×2): qty 1

## 2015-07-23 MED ORDER — LEVALBUTEROL HCL 1.25 MG/0.5ML IN NEBU
INHALATION_SOLUTION | RESPIRATORY_TRACT | Status: AC
  Administered 2015-07-23: 1.25 mg via RESPIRATORY_TRACT
  Filled 2015-07-23: qty 0.5

## 2015-07-23 MED ORDER — ACETAMINOPHEN 325 MG PO TABS
650.0000 mg | ORAL_TABLET | ORAL | Status: AC
  Administered 2015-07-23: 650 mg via ORAL
  Filled 2015-07-23: qty 2

## 2015-07-23 MED ORDER — ALENDRONATE SODIUM 70 MG PO TABS: ORAL_TABLET | ORAL | Status: DC

## 2015-07-23 MED ORDER — OMEPRAZOLE 20 MG PO CPDR: 20.0000 mg | DELAYED_RELEASE_CAPSULE | Freq: Two times a day (BID) | ORAL | Status: DC

## 2015-07-23 MED ORDER — METHYLPREDNISOLONE SODIUM SUCC 125 MG IJ SOLR
60.0000 mg | Freq: Three times a day (TID) | INTRAMUSCULAR | Status: DC
  Administered 2015-07-23 – 2015-07-24 (×2): 60 mg via INTRAVENOUS
  Filled 2015-07-23 (×2): qty 2

## 2015-07-23 MED ORDER — VITAMIN D3 25 MCG (1000 UT) PO CAPS: ORAL_CAPSULE | ORAL | Status: DC

## 2015-07-23 MED ORDER — PANTOPRAZOLE SODIUM 40 MG PO TBEC
40.0000 mg | DELAYED_RELEASE_TABLET | Freq: Every day | ORAL | Status: DC
  Administered 2015-07-24 – 2015-07-25 (×2): 40 mg via ORAL
  Filled 2015-07-23 (×2): qty 1

## 2015-07-23 MED ORDER — LEVALBUTEROL HCL 1.25 MG/0.5ML IN NEBU
1.2500 mg | INHALATION_SOLUTION | Freq: Once | RESPIRATORY_TRACT | Status: AC
  Administered 2015-07-23: 1.25 mg via RESPIRATORY_TRACT

## 2015-07-23 MED ORDER — PREDNISONE 2.5 MG PO TABS: 2.5000 mg | ORAL_TABLET | Freq: Every day | ORAL | Status: DC

## 2015-07-23 MED ORDER — BENZONATATE 100 MG PO CAPS
100.0000 mg | ORAL_CAPSULE | Freq: Once | ORAL | Status: AC
  Administered 2015-07-23: 100 mg via ORAL
  Filled 2015-07-23: qty 1

## 2015-07-23 MED ORDER — ACETAMINOPHEN 325 MG PO TABS
650.0000 mg | ORAL_TABLET | Freq: Four times a day (QID) | ORAL | Status: DC | PRN
Start: 2015-07-23 — End: 2015-07-25
  Administered 2015-07-23 – 2015-07-24 (×2): 650 mg via ORAL
  Filled 2015-07-23 (×2): qty 2

## 2015-07-23 MED ORDER — SENNA 8.6 MG PO TABS
1.0000 | ORAL_TABLET | Freq: Every day | ORAL | Status: DC
  Administered 2015-07-23 – 2015-07-24 (×2): 8.6 mg via ORAL
  Filled 2015-07-23 (×2): qty 1

## 2015-07-23 MED ORDER — LEVOTHYROXINE SODIUM 125 MCG PO TABS
125.0000 ug | ORAL_TABLET | Freq: Every day | ORAL | Status: DC
Start: 2015-07-24 — End: 2015-07-25
  Administered 2015-07-24 – 2015-07-25 (×2): 125 ug via ORAL
  Filled 2015-07-23 (×2): qty 1

## 2015-07-23 MED ORDER — METHYLPREDNISOLONE SODIUM SUCC 125 MG IJ SOLR
125.0000 mg | INTRAMUSCULAR | Status: AC
  Administered 2015-07-23: 125 mg via INTRAVENOUS
  Filled 2015-07-23: qty 2

## 2015-07-23 MED ORDER — ENOXAPARIN SODIUM 40 MG/0.4ML ~~LOC~~ SOLN
40.0000 mg | SUBCUTANEOUS | Status: DC
  Administered 2015-07-23 – 2015-07-24 (×2): 40 mg via SUBCUTANEOUS
  Filled 2015-07-23 (×2): qty 0.4

## 2015-07-23 MED ORDER — BUDESONIDE-FORMOTEROL FUMARATE 160-4.5 MCG/ACT IN AERO
2.0000 | INHALATION_SPRAY | Freq: Two times a day (BID) | RESPIRATORY_TRACT | Status: DC
  Administered 2015-07-24 – 2015-07-25 (×3): 2 via RESPIRATORY_TRACT
  Filled 2015-07-23: qty 6

## 2015-07-23 MED ORDER — BENZONATATE 100 MG PO CAPS
200.0000 mg | ORAL_CAPSULE | Freq: Three times a day (TID) | ORAL | Status: DC | PRN
  Administered 2015-07-24 – 2015-07-25 (×2): 200 mg via ORAL
  Filled 2015-07-23 (×2): qty 2

## 2015-07-23 MED ORDER — IBUPROFEN 400 MG PO TABS
400.0000 mg | ORAL_TABLET | Freq: Four times a day (QID) | ORAL | Status: DC | PRN
  Administered 2015-07-25: 400 mg via ORAL
  Filled 2015-07-23: qty 1

## 2015-07-23 MED ORDER — IPRATROPIUM-ALBUTEROL 0.5-2.5 (3) MG/3ML IN SOLN
3.0000 mL | RESPIRATORY_TRACT | Status: DC
  Administered 2015-07-24 (×3): 3 mL via RESPIRATORY_TRACT
  Filled 2015-07-23 (×3): qty 3

## 2015-07-23 MED ORDER — TIOTROPIUM BROMIDE MONOHYDRATE 18 MCG IN CAPS
18.0000 ug | ORAL_CAPSULE | Freq: Every day | RESPIRATORY_TRACT | Status: DC
  Administered 2015-07-24 – 2015-07-25 (×2): 18 ug via RESPIRATORY_TRACT
  Filled 2015-07-23: qty 5

## 2015-07-23 MED ORDER — LEVALBUTEROL HCL 1.25 MG/0.5ML IN NEBU
1.2500 mg | INHALATION_SOLUTION | Freq: Once | RESPIRATORY_TRACT | Status: AC
  Administered 2015-07-23: 1.25 mg via RESPIRATORY_TRACT
  Filled 2015-07-23: qty 0.5

## 2015-07-23 MED ORDER — ACETAMINOPHEN 650 MG RE SUPP: 650.0000 mg | Freq: Four times a day (QID) | RECTAL | Status: DC | PRN

## 2015-07-23 MED ORDER — POLYETHYLENE GLYCOL 3350 17 G PO PACK: 17.0000 g | PACK | Freq: Every day | ORAL | Status: DC | PRN

## 2015-07-23 MED ORDER — ONDANSETRON HCL 4 MG PO TABS: 4.0000 mg | ORAL_TABLET | Freq: Four times a day (QID) | ORAL | Status: DC | PRN

## 2015-07-23 MED ORDER — METHOTREXATE 2.5 MG PO TABS: 2.5000 mg | ORAL_TABLET | ORAL | Status: DC

## 2015-07-23 NOTE — ED Notes (Signed)
Arrive via EMS for cough and wheezing - bronchitis dx 2 wks ago treated with prednisone and levaquin - continues with productive cough

## 2015-07-23 NOTE — Assessment & Plan Note (Addendum)
Acute hypoxic respiratory failure. EMS called. To [facility] ED for further evaluation, CXR, IV steroids, inhaled bronchodilators.

## 2015-07-23 NOTE — ED Notes (Signed)
Pt comes from group home - Dx with bronchitis 2 wks ago and treated with prednisone and levaquin - Pt has expiratory wheezes bilat throughout - pt placed on 2L O2

## 2015-07-23 NOTE — ED Provider Notes (Signed)
[facility] Emergency Department Provider Note  ____________________________________________  Time seen: Approximately 1725 PM  I have reviewed the triage vital signs and the nursing notes.   HISTORY  Chief Complaint Cough    HPI Cindy Oconnor is a 45 y.o. female history of asthma, ulcerative colitis, trisomy 64.  Patient has been coughing and wheezing for approximately 2 weeks. Patient's mother who reports self as guardian tells me she was treated with prednisone, Levaquin, and her symptoms continue to worsen. She has a history of bad "bronchitis" has required multiple hospitalizations due to trouble breathing and wheezing.  With EMS the patient's oxygen saturation was reportedly less than 88%, and she required supplemental oxygen.  The patient does have a reported previous history of a DVT, however this was provoked by injury. She is not having any chest pain, and has no recent history of blood clots. Mother reports patient has had the same presentation many times due to bronchitis and asthma.  Past Medical History  Diagnosis Date  . Asthma   . Arthritis   . Chicken pox   . RA (rheumatoid arthritis) ([place])   . Ulcerative colitis ([place])   . Deafness in left ear   . Down syndrome   . Blood clot in vein     Patient Active Problem List   Diagnosis Date Noted  . Asthma, chronic obstructive, with acute exacerbation ([place]) 07/23/2015  . Acute respiratory failure with hypoxia ([place]) 07/23/2015  . Asthma exacerbation 07/23/2015  . Routine general medical examination at a health care facility 01/24/2013  . Nonspecific abnormal electrocardiogram (ECG) (EKG) 01/24/2013  . Obesity (BMI 30-39.9) 01/21/2012  . Recurrent boils 03/12/2009  . ULCERATIVE COLITIS 06/11/2006  . Rheumatoid arthritis ([place]) 06/11/2006  . OSTEOPENIA 06/11/2006  . HYSTERECTOMY, HX OF 06/11/2006  . Hypothyroidism 03/18/2006  . Asthma, chronic 03/18/2006  . GERD 03/18/2006  . DOWN'S  SYNDROME 03/18/2006    Past Surgical History  Procedure Laterality Date  . Abdominal hysterectomy  1992    partial  . Foot surgery      x2    No current outpatient prescriptions on file.  Allergies Albuterol and Erythromycin  Family History  Problem Relation Age of Onset  . Heart disease Mother   . Diabetes Mother   . Heart disease Father   . Diabetes Father   . Alcohol abuse Maternal Grandmother   . Arthritis Maternal Grandmother   . Stroke Maternal Grandmother   . Diabetes Maternal Grandmother   . Alcohol abuse Maternal Grandfather   . Arthritis Maternal Grandfather   . Stroke Maternal Grandfather   . Diabetes Maternal Grandfather     Social History Social History  Substance Use Topics  . Smoking status: Never Smoker   . Smokeless tobacco: None  . Alcohol Use: No    Review of Systems Constitutional: Chills and feeling warm at times Eyes: No visual changes. ENT: No sore throat. Cardiovascular: Denies chest pain. Respiratory: Mild to moderate shortness of breath, wheezing.Gastrointestinal: No abdominal pain.  No nausea, no vomiting.  No diarrhea.  No constipation. Genitourinary: Negative for dysuria. Musculoskeletal: Negative for back pain. Skin: Negative for rash. Neurological: Negative for headaches, focal weakness or numbness.  10-point ROS otherwise negative.  ____________________________________________   PHYSICAL EXAM:  VITAL SIGNS: ED Triage Vitals  Enc Vitals Group     BP 07/23/15 1724 103/51 mmHg     Pulse Rate 07/23/15 1724 74     Resp --      Temp 07/23/15  1724 98.7 F (37.1 C)     Temp Source 07/23/15 1724 Oral     SpO2 07/23/15 1724 91 %     Weight 07/23/15 1730 166 lb (75.297 kg)     Height 07/23/15 1730 4' 9"  (1.448 m)     Head Cir --      Peak Flow --      Pain Score --      Pain Loc --      Pain Edu? --      Excl. in [place]? --    Constitutional: Alert and oriented. Mild to moderate increased work of breathing with audible  wheezing noted. A frequent dry cough Eyes: Conjunctivae are normal. PERRL. EOMI. Head: Atraumatic. Nose: No congestion/rhinnorhea. Mouth/Throat: Mucous membranes are moist.  Oropharynx non-erythematous. Neck: No stridor.   Cardiovascular: Normal rate, regular rhythm. Grossly normal heart sounds.  Good peripheral circulation. Respiratory: Moderate increased work of breathing, and expiratory wheezing heard throughout all lobes. She speaks in short phrases. 98% on 1 L presently. Gastrointestinal: Soft and nontender. No distention. No abdominal bruits. No CVA tenderness. Musculoskeletal: No lower extremity tenderness nor edema.  Arthritic changes in the hands bilateral. Neurologic:  Normal speech and language. No gross focal neurologic deficits are appreciated. Skin:  Skin is warm, dry and intact. No rash noted. Psychiatric: Mood and affect are slightly anxious, but she is very pleasant. ____________________________________________   LABS (all labs ordered are listed, but only abnormal results are displayed)  Labs Reviewed  CBC - Abnormal; Notable for the following:    RBC 3.57 (*)    MCV 103.6 (*)    MCH 34.9 (*)    RDW 16.5 (*)    All other components within normal limits  BASIC METABOLIC PANEL - Abnormal; Notable for the following:    Glucose, Bld 107 (*)    Calcium 8.5 (*)    All other components within normal limits  RAPID INFLUENZA A&B ANTIGENS ([facility] ONLY)  CULTURE, EXPECTORATED SPUTUM-ASSESSMENT   ____________________________________________  EKG  Reviewed and interpreted by me at 2040 Ventricular rate 95 QRS 120 QTc 460 Left bundle branch block, no clear evidence of ischemic abnormality. Pre-existing left bundle. ____________________________________________  RADIOLOGY  DG Chest 2 View (Final result) Result time: 07/23/15 18:52:35   Final result by Rad Results In Interface (07/23/15 18:52:35)   Narrative:   CLINICAL DATA: Recent history of wheezing and  bronchitis  EXAM: CHEST 2 VIEW  COMPARISON: 06/27/2013  FINDINGS: Cardiac shadow is within normal limits. The lungs are well aerated bilaterally. Increased degenerative kyphosis of the thoracic spine is seen. Mild interstitial changes are noted consistent with the given clinical history of bronchitis. No focal infiltrate is seen.  IMPRESSION: Bronchitic changes without acute abnormality.   Electronically Signed By: Inez Catalina M.D. On: 07/23/2015 18:52    ____________________________________________   PROCEDURES  Procedure(s) performed: None  Critical Care performed: No  ____________________________________________   INITIAL IMPRESSION / ASSESSMENT AND PLAN / ED COURSE  Pertinent labs & imaging results that were available during my care of the patient were reviewed by me and considered in my medical decision making (see chart for details).  Evidence for cough, recent diagnosis of bronchitis, despite treatment with Levaquin and prednisone her symptoms continue to worsen even with Xopenex inhalers at home.  ----------------------------------------- 8:29 PM on 07/23/2015 -----------------------------------------  His mentation appears most consistent with reactive airway disease and bronchospasm likely secondary to bronchitis. She does show modest improvement with treatment in the ER, however at this time  still mild oxygen requirement, mild tachypnea, and mild end expiratory wheezing ongoing. She is slightly tachycardic. Discussed with patient and her family, and we will admit her to the hospital for asthma exacerbation the setting of acute bronchitis. ____________________________________________   FINAL CLINICAL IMPRESSION(S) / ED DIAGNOSES  Final diagnoses:  Asthma, moderate  Bronchitis, acute, with bronchospasm  On supplemental oxygen therapy      Delman Kitten, MD 07/23/15 2129

## 2015-07-23 NOTE — ED Notes (Signed)
Pt to xray now

## 2015-07-23 NOTE — Progress Notes (Signed)
Subjective:    Patient ID: Cindy Oconnor, female    DOB: 20-Jan-1971, 45 y.o.   MRN: [id number]  HPI  45YO female presents for acute visit.  Seen at urgent care 1/30 and treated for URI with Levaquin and Prednisone taper. Over the last few days, her mother reports she has developed wheezing and worsening cough. She lives in a group home and details are limited. No known fever. She does have a history of asthma and has been taking Symbicort.  Wt Readings from Last 3 Encounters:  07/23/15 166 lb (75.297 kg)  07/23/15 166 lb (75.297 kg)  05/19/15 164 lb 12 oz (74.73 kg)   BP Readings from Last 3 Encounters:  07/23/15 103/51  07/23/15 100/68  05/19/15 102/80    Past Medical History  Diagnosis Date  . Asthma   . Arthritis   . Chicken pox   . RA (rheumatoid arthritis) ([place])   . Ulcerative colitis ([place])   . Deafness in left ear   . Down syndrome   . Blood clot in vein    Family History  Problem Relation Age of Onset  . Heart disease Mother   . Diabetes Mother   . Heart disease Father   . Diabetes Father   . Alcohol abuse Maternal Grandmother   . Arthritis Maternal Grandmother   . Stroke Maternal Grandmother   . Diabetes Maternal Grandmother   . Alcohol abuse Maternal Grandfather   . Arthritis Maternal Grandfather   . Stroke Maternal Grandfather   . Diabetes Maternal Grandfather    Past Surgical History  Procedure Laterality Date  . Abdominal hysterectomy  1992    partial  . Foot surgery      x2   Social History   Social History  . Marital Status: Single    Spouse Name: N/A  . Number of Children: N/A  . Years of Education: N/A   Social History Main Topics  . Smoking status: Never Smoker   . Smokeless tobacco: None  . Alcohol Use: No  . Drug Use: None  . Sexual Activity: Not Asked   Other Topics Concern  . None   Social History Narrative   Lives at [facility] group home.    Review of Systems  Constitutional: Positive for  fatigue. Negative for fever, chills and unexpected weight change.  HENT: Positive for congestion, postnasal drip and rhinorrhea. Negative for ear discharge, ear pain, facial swelling, hearing loss, mouth sores, nosebleeds, sinus pressure, sneezing, sore throat, tinnitus, trouble swallowing and voice change.   Eyes: Negative for pain, discharge, redness and visual disturbance.  Respiratory: Positive for cough, shortness of breath and wheezing. Negative for chest tightness and stridor.   Cardiovascular: Negative for chest pain, palpitations and leg swelling.  Musculoskeletal: Negative for myalgias, arthralgias, neck pain and neck stiffness.  Skin: Negative for color change and rash.  Neurological: Negative for dizziness, weakness, light-headedness and headaches.  Hematological: Negative for adenopathy.       Objective:    BP 100/68 mmHg  Pulse 108  Temp(Src) 99.7 F (37.6 C) (Oral)  Ht 4' 7"  (1.397 m)  Wt 166 lb (75.297 kg)  BMI 38.58 kg/m2  SpO2 94%  LMP 04/11/1981 Physical Exam  Constitutional: She is oriented to person, place, and time. She appears well-developed and well-nourished. She appears lethargic. She appears distressed.  HENT:  Head: Normocephalic and atraumatic.  Right Ear: External ear normal.  Left Ear: External ear normal.  Nose: Nose normal.  Mouth/Throat:  Oropharynx is clear and moist.  Eyes: Conjunctivae are normal. Pupils are equal, round, and reactive to light. Right eye exhibits no discharge. Left eye exhibits no discharge. No scleral icterus.  Neck: Normal range of motion. Neck supple. No tracheal deviation present. No thyromegaly present.  Cardiovascular: Normal rate, regular rhythm, normal heart sounds and intact distal pulses.  Exam reveals no gallop and no friction rub.   No murmur heard. Pulmonary/Chest: Accessory muscle usage present. Tachypnea noted. She is in respiratory distress. She has wheezes. She has rhonchi. She has no rales. She exhibits no  tenderness.  Musculoskeletal: Normal range of motion. She exhibits no edema or tenderness.  Lymphadenopathy:    She has no cervical adenopathy.  Neurological: She is oriented to person, place, and time. She appears lethargic. No cranial nerve deficit. She exhibits normal muscle tone. Coordination normal.  Skin: Skin is warm and dry. No rash noted. She is not diaphoretic. No erythema. No pallor.  Psychiatric: She has a normal mood and affect. Her behavior is normal. Judgment and thought content normal.          Assessment & Plan:   Problem List Items Addressed This Visit      Unprioritized   Acute respiratory failure with hypoxia ([place])    Acute hypoxic respiratory failure. EMS called. To [facility] ED for further evaluation, CXR, IV steroids, inhaled bronchodilators.      Asthma, chronic obstructive, with acute exacerbation ([facility]) - Primary    Asthma exacerbation. Failed outpatient Levaquin and Prednisone taper. Respiratory distress with hypoxia today. EMS called. Transported to [facility]. Will need CXR and further evaluation.          No Follow-up on file.  Ronette Deter, MD Internal Medicine [facility]

## 2015-07-23 NOTE — Progress Notes (Signed)
Pre visit review using our clinic review tool, if applicable. No additional management support is needed unless otherwise documented below in the visit note. 

## 2015-07-23 NOTE — Assessment & Plan Note (Signed)
Asthma exacerbation. Failed outpatient Levaquin and Prednisone taper. Respiratory distress with hypoxia today. EMS called. Transported to [facility]. Will need CXR and further evaluation.

## 2015-07-23 NOTE — H&P (Signed)
[place] [facility] [place] NAME: Cindy Oconnor    MR#:  [id number]  DATE OF BIRTH:  07-17-1970  DATE OF ADMISSION:  07/23/2015  PRIMARY CARE PHYSICIAN: Rica Mast, MD   REQUESTING/REFERRING PHYSICIAN: Dr. Jacqualine Code  CHIEF COMPLAINT:   Chief Complaint  Patient presents with  . Cough    HISTORY OF PRESENT ILLNESS:  Cindy Oconnor  is a 45 y.o. female with a known history of hearing loss and Down syndrome presents to the emergency room for shortness of breath and wheezing. Patient was seen in the office by Dr. Gilford Rile and found to be hypoxic and EMS was called. Patient's saturations were 85% on room air. Here patient has received a Solu-Medrol, nebulizer therapy and continues to have increased work of breathing with significant wheezing and is being admitted. On 2 L oxygen.   symptoms started 3 weeks back and she was treated with Levaquin and prednisone as outpatient with only mild improvement. She started worsening again a few days back. Today she is afebrile but tachycardic and hypoxic. Chest x-ray shows no infiltrates. Patient is chronically on low-dose prednisone.  PAST MEDICAL HISTORY:   Past Medical History  Diagnosis Date  . Asthma   . Arthritis   . Chicken pox   . RA (rheumatoid arthritis) ([place])   . Ulcerative colitis ([place])   . Deafness in left ear   . Down syndrome   . Blood clot in vein     PAST SURGICAL HISTORY:   Past Surgical History  Procedure Laterality Date  . Abdominal hysterectomy  1992    partial  . Foot surgery      x2    SOCIAL HISTORY:   Social History  Substance Use Topics  . Smoking status: Never Smoker   . Smokeless tobacco: Not on file  . Alcohol Use: No    FAMILY HISTORY:   Family History  Problem Relation Age of Onset  . Heart disease Mother   . Diabetes Mother   . Heart disease Father   . Diabetes Father   . Alcohol abuse Maternal Grandmother   . Arthritis Maternal  Grandmother   . Stroke Maternal Grandmother   . Diabetes Maternal Grandmother   . Alcohol abuse Maternal Grandfather   . Arthritis Maternal Grandfather   . Stroke Maternal Grandfather   . Diabetes Maternal Grandfather     DRUG ALLERGIES:   Allergies  Allergen Reactions  . Albuterol Other (See Comments)    "gittery"  . Erythromycin     REVIEW OF SYSTEMS:   Review of Systems  Constitutional: Positive for chills and malaise/fatigue. Negative for fever and weight loss.  HENT: Positive for hearing loss. Negative for nosebleeds.   Eyes: Negative for blurred vision, double vision and pain.  Respiratory: Positive for cough, sputum production and wheezing. Negative for hemoptysis and shortness of breath.   Cardiovascular: Positive for chest pain (on coughing). Negative for palpitations, orthopnea and leg swelling.  Gastrointestinal: Negative for nausea, vomiting, abdominal pain, diarrhea and constipation.  Genitourinary: Negative for dysuria and hematuria.  Musculoskeletal: Negative for myalgias, back pain and falls.  Skin: Negative for rash.  Neurological: Positive for weakness. Negative for dizziness, tremors, sensory change, speech change, focal weakness, seizures and headaches.  Endo/Heme/Allergies: Does not bruise/bleed easily.  Psychiatric/Behavioral: Positive for memory loss. Negative for depression. The patient is not nervous/anxious.     MEDICATIONS AT HOME:   Prior to Admission medications   Medication Sig Start Date End  Date Taking? Authorizing Provider  alendronate (FOSAMAX) 70 MG tablet 1 TABLET BY MOUTH EARLY MORNING 30 MIN. BEFORE FOOD/MEDS W/ 8OZ OF WATER,DON'T LIE DOWN FOR 1HR AFTER TAKING 07/23/15   Jackolyn Confer, MD  benzonatate (TESSALON) 100 MG capsule TAKE 1 CAPSULE BY MOUTH THREE TIMES DAILY AS NEEDED *DO NOT CRUSH* 06/19/14   Jackolyn Confer, MD  budesonide-formoterol (SYMBICORT) 160-4.5 MCG/ACT inhaler INHALE 2 PUFFS BY MOUTH TWICE DAILY FOR BREATHING.  *RINSE MOUTH AFTERUSE* 06/19/15   Jackolyn Confer, MD  calcium carbonate (OS-CAL - DOSED IN MG OF ELEMENTAL CALCIUM) 1250 (500 CA) MG tablet TAKE ONE TABLET BY MOUTH EACH DAY. 02/12/15   Jackolyn Confer, MD  calcium carbonate 1250 MG capsule Take 1 capsule (1,250 mg total) by mouth daily. 06/19/15   Jackolyn Confer, MD  carbamide peroxide (DEBROX) 6.5 % otic solution Place 5 drops into both ears once monthly as needed 08/22/14   Rubbie Battiest, NP  Cholecalciferol (VITAMIN D3) 1000 units CAPS TAKE 1 TABLET BY MOUTH ONCE DAILY FOR SUPPLEMENT. 07/23/15   Jackolyn Confer, MD  diclofenac sodium (VOLTAREN) 1 % GEL Apply 2g to small joints, 4g to large joints, up to 4x day as needed. Do not exceed 16gm total per day 11/06/14   Historical Provider, MD  docusate sodium (STOOL SOFTENER) 100 MG capsule TAKE 1 CAPSULE BY MOUTH EVERY OTHER DAY *DO NOT CRUSH* 07/23/15   Jackolyn Confer, MD  folic acid (FOLVITE) 1 MG tablet Take 1 tablet (1 mg total) by mouth daily. 07/23/15   Jackolyn Confer, MD  gentamicin ointment (GARAMYCIN) 0.1 % Apply 1 application topically 3 (three) times daily as needed.    Historical Provider, MD  ibuprofen (ADVIL,MOTRIN) 200 MG tablet Take one tablet by mouth twice a day as needed for pain 03/17/15   Jackolyn Confer, MD  ketoconazole (NIZORAL) 2 % cream Apply 1 application topically daily. 06/19/15   Jackolyn Confer, MD  lactulose ([place]) 10 GM/15ML solution TAKE 45 MLS BY MOUTH ONCE DAILY AS NEEDED FOR CONSTIPATION. 10/16/13   Jackolyn Confer, MD  levalbuterol Penne Lash) 0.63 MG/3ML nebulizer solution Take 3 mLs (0.63 mg total) by nebulization 3 (three) times daily as needed for wheezing or shortness of breath. 07/04/15   Jackolyn Confer, MD  levothyroxine (SYNTHROID, LEVOTHROID) 125 MCG tablet Take 1 tablet (125 mcg total) by mouth daily. Take in the morning with only water before breakfast 05/22/15   Jackolyn Confer, MD  methotrexate (RHEUMATREX) 2.5 MG tablet Take 1 tablet  (2.5 mg total) by mouth once a week. Take 7 tablets once a week. Caution:Chemotherapy. Protect from light. 07/23/15   Jackolyn Confer, MD  MUCUS RELIEF 400 MG TABS tablet TAKE 1 TABLET BY MOUTH EVERY 4 HOURS AS NEEDED FOR CONGESTION. 06/19/14   Jackolyn Confer, MD  Multiple Vitamin (MULTI-VITAMINS) TABS Take by mouth.    Historical Provider, MD  Multiple Vitamin (THEREMS) TABS TAKE 1 TABLET BY MOUTH ONCE DAILY FOR SUPPLEMENT. 06/19/15   Jackolyn Confer, MD  omeprazole (PRILOSEC) 20 MG capsule Take 1 capsule (20 mg total) by mouth 2 (two) times daily. DO NOT CRUSH 07/23/15   Jackolyn Confer, MD  ondansetron (ZOFRAN) 4 MG tablet Take by mouth.    Historical Provider, MD  predniSONE (DELTASONE) 2.5 MG tablet Take 1 tablet (2.5 mg total) by mouth daily. 07/23/15   Jackolyn Confer, MD  senna (SENEXON) 8.6 MG tablet Take 1 tablet (8.6 mg  total) by mouth at bedtime. 06/19/15   Jackolyn Confer, MD  tiotropium (SPIRIVA HANDIHALER) 18 MCG inhalation capsule CONTENTS OF 1 CAPSULE INHALED BY MOUTH VIA INHALER EVERY 24 HOURS. (BREATHING) 06/19/15   Jackolyn Confer, MD     VITAL SIGNS:  Blood pressure 118/58, pulse 116, temperature 98.7 F (37.1 C), temperature source Oral, resp. rate 18, height 4' 9"  (1.448 m), weight 75.297 kg (166 lb), last menstrual period 04/11/1981, SpO2 95 %.  PHYSICAL EXAMINATION:  Physical Exam  GENERAL:  45 y.o.-year-old patient lying in the bed with mild conversational dyspnea EYES: Pupils equal, round, reactive to light and accommodation. No scleral icterus. Extraocular muscles intact.  HEENT: Head atraumatic, normocephalic. Oropharynx and nasopharynx clear. No oropharyngeal erythema, moist oral mucosa  NECK:  Supple, no jugular venous distention. No thyroid enlargement, no tenderness.  LUNGS: Increased work of breathing and wheezing CARDIOVASCULAR: S1, S2 normal. No murmurs, rubs, or gallops.  ABDOMEN: Soft, nontender, nondistended. Bowel sounds present. No organomegaly  or mass.  EXTREMITIES: No pedal edema, cyanosis, or clubbing. + 2 pedal & radial pulses b/l.   NEUROLOGIC: Cranial nerves II through XII are intact. No focal Motor or sensory deficits appreciated b/l PSYCHIATRIC: The patient is alert and oriented x 3. Good affect.  SKIN: No obvious rash, lesion, or ulcer.   LABORATORY PANEL:   CBC  Recent Labs Lab 07/23/15 1832  WBC 6.9  HGB 12.4  HCT 37.0  PLT 198   ------------------------------------------------------------------------------------------------------------------  Chemistries   Recent Labs Lab 07/23/15 1832  NA 137  K 3.6  CL 103  CO2 27  GLUCOSE 107*  BUN 11  CREATININE 0.90  CALCIUM 8.5*   ------------------------------------------------------------------------------------------------------------------  Cardiac Enzymes No results for input(s): TROPONINI in the last 168 hours. ------------------------------------------------------------------------------------------------------------------  RADIOLOGY:  Dg Chest 2 View  07/23/2015  CLINICAL DATA:  Recent history of wheezing and bronchitis EXAM: CHEST  2 VIEW COMPARISON:  06/27/2013 FINDINGS: Cardiac shadow is within normal limits. The lungs are well aerated bilaterally. Increased degenerative kyphosis of the thoracic spine is seen. Mild interstitial changes are noted consistent with the given clinical history of bronchitis. No focal infiltrate is seen. IMPRESSION: Bronchitic changes without acute abnormality. Electronically Signed   By: Inez Catalina M.D.   On: 07/23/2015 18:52     IMPRESSION AND PLAN:   * Asthma exacerbation with acute hypoxic respiratory failure -IV steroids - Scheduled Nebulizers - Inhalers -Wean O2 as tolerated - Consult pulmonary if no improvement  * Rheumatoid arthritis Weekly Methotrexate  * DVT prophylaxis Lovenox   All the records are reviewed and case discussed with ED provider. Management plans discussed with the patient,  family and they are in agreement.  CODE STATUS: FULL  TOTAL TIME TAKING CARE OF THIS PATIENT: 40 minutes.   Hillary Bow R M.D on 07/23/2015 at 8:39 PM  Between 7am to 6pm - Pager - [phone]  After 6pm go to [email or web address] - password [email or web address] [facility]  Office  [phone]  CC: Primary care physician; Rica Mast, MD  Note: This dictation was prepared with Dragon dictation along with smaller phrase technology. Any transcriptional errors that result from this process are unintentional.

## 2015-07-23 NOTE — ED Notes (Signed)
Pt c/o oxygen hurting her nose - O2 sat 98% on 2L - ok'd per MD to decrease O2 to 1L via n/c

## 2015-07-24 ENCOUNTER — Telehealth: Payer: Self-pay | Admitting: Internal Medicine

## 2015-07-24 MED ORDER — METHYLPREDNISOLONE SODIUM SUCC 40 MG IJ SOLR
40.0000 mg | Freq: Two times a day (BID) | INTRAMUSCULAR | Status: DC
  Administered 2015-07-24 – 2015-07-25 (×2): 40 mg via INTRAVENOUS
  Filled 2015-07-24 (×2): qty 1

## 2015-07-24 MED ORDER — GUAIFENESIN 100 MG/5ML PO SOLN: 5.0000 mL | ORAL | Status: DC | PRN

## 2015-07-24 MED ORDER — LEVALBUTEROL HCL 0.63 MG/3ML IN NEBU
0.6300 mg | INHALATION_SOLUTION | Freq: Four times a day (QID) | RESPIRATORY_TRACT | Status: DC
  Administered 2015-07-24 – 2015-07-25 (×4): 0.63 mg via RESPIRATORY_TRACT
  Filled 2015-07-24 (×7): qty 3

## 2015-07-24 MED ORDER — ALPRAZOLAM 0.25 MG PO TABS
0.2500 mg | ORAL_TABLET | Freq: Three times a day (TID) | ORAL | Status: DC | PRN
  Administered 2015-07-24: 0.25 mg via ORAL
  Filled 2015-07-24: qty 1

## 2015-07-24 NOTE — Telephone Encounter (Signed)
Spoke with amanda.  Clarified medications

## 2015-07-24 NOTE — Clinical Social Work Note (Signed)
Clinical Social Work Assessment  Patient Details  Name: Cindy Oconnor MRN: [id number] Date of Birth: [birth year]  Date of referral:  07/24/15               Reason for consult:  Other (Comment Required) (Patient is from [place] )                Permission sought to share information with:  Facility Sport and exercise psychologist, Guardian Permission granted to share information::  Yes, Verbal Permission Granted  Name::      Cindy Oconnor  Agency::     Relationship::   Guardian/ Mother  Contact Information:   [phone]   Housing/Transportation Living arrangements for the past 2 months:  [place] of Information:  Patient, Facility, Guardian Patient Interpreter Needed:  None Criminal Activity/Legal Involvement Pertinent to Current Situation/Hospitalization:  No - Comment as needed Significant Relationships:  Warehouse manager, Parents, Other Family Members Lives with:  Facility Resident Do you feel safe going back to the place where you live?  Yes Need for family participation in patient care:  Yes (Comment)  Care giving concerns:  Patient is a resident at [facility] located at [address] [place] [address].     Social Worker assessment / plan:  Holiday representative (CSW) reviewed chart and noted that patient is from a [employer]. CSW contacted Haematologist for [facility]. Per Butch Penny patient has down syndrome and her mother Cindy Oconnor is her legal guardian. Per Alan Mulder is very attentive and supportive of patient's needs. Per Butch Penny patient is overall in good health and does not need a lot of care. Butch Penny reported that patient does have arthritis and takes medication that requires her labs to be monitored. Per Butch Penny patient has a handicap brother as well. Butch Penny reported that patient can return to [facility] from [facility].  CSW met with patient and her mother/guardian Cindy Oconnor. Patient was sitting up eating breakfast and was very pleasant. Patient  reported that her mother is her guardian and she lives at [facility]. Patient's mother appeared anxious and reported that her brother passed away at [facility] a year ago. Per mother patient has lived at [facility] for 4 years and before that was in a group home in [place]. CSW provided emotional support. Patient's mother is agreeable for patient to return to [facility].   FL2 complete. CSW will continue to follow and assist as needed.   Employment status:  Disabled (Comment on whether or not currently receiving Disability) Insurance information:  Medicare, Medicaid [place] PT Recommendations:  Not assessed at this time Information / Referral to community resources:  Other (Comment Required) (Will return to [facility] )  Patient/Family's Response to care:  Patient and guardian are agreeable for patient to return to [facility].   Patient/Family's Understanding of and Emotional Response to Diagnosis, Current Treatment, and Prognosis:  Patient was pleasant throughout assessment.   Emotional Assessment Appearance:  Appears stated age Attitude/Demeanor/Rapport:    Affect (typically observed):  Accepting, Adaptable, Pleasant Orientation:  Oriented to Self, Oriented to Place, Oriented to  Time Alcohol / Substance use:  Not Applicable Psych involvement (Current and /or in the community):  No (Comment)  Discharge Needs  Concerns to be addressed:  No discharge needs identified Readmission within the last 30 days:  No Current discharge risk:  None Barriers to Discharge:  No Barriers Identified   Blima Rich  G, LCSW 07/24/2015, 10:43 AM

## 2015-07-24 NOTE — Progress Notes (Signed)
Paged [facility].  Patient coughing vigorously and choking, emotional state very uncontrolled due to coughing spells and pain in chest from coughing.  Orders for Xanax and tessalon pearls to calm patient and to control coughing

## 2015-07-24 NOTE — Progress Notes (Signed)
[facility] at [place] NAME: Cindy Oconnor    MR#:  [id number]  DATE OF BIRTH:  Sep 02, 1970  SUBJECTIVE:   Patient reports cough and wheezing. She was treated couple weeks ago with Levaquin and buttocks. She has improved symptoms since admission.  REVIEW OF SYSTEMS:    Review of Systems  Constitutional: Negative for fever, chills and malaise/fatigue.  HENT: Negative for sore throat.   Eyes: Negative for blurred vision.  Respiratory: Positive for cough and wheezing. Negative for hemoptysis and shortness of breath.   Cardiovascular: Negative for chest pain, palpitations and leg swelling.  Gastrointestinal: Negative for nausea, vomiting, abdominal pain, diarrhea and blood in stool.  Genitourinary: Negative for dysuria.  Musculoskeletal: Negative for back pain.  Neurological: Negative for dizziness, tremors and headaches.  Endo/Heme/Allergies: Does not bruise/bleed easily.  Psychiatric/Behavioral: Negative for depression and suicidal ideas.    Tolerating Diet: Yes      DRUG ALLERGIES:   Allergies  Allergen Reactions  . Albuterol Other (See Comments)    Reaction:  Makes pt jittery   . Erythromycin Other (See Comments)    Reaction:  Unknown     VITALS:  Blood pressure 109/54, pulse 70, temperature 97.7 F (36.5 C), temperature source Oral, resp. rate 18, height 4' 9"  (1.448 m), weight 74.954 kg (165 lb 3.9 oz), last menstrual period 04/11/1981, SpO2 98 %.  PHYSICAL EXAMINATION:   Physical Exam  Constitutional: She is oriented to person, place, and time and well-developed, well-nourished, and in no distress. No distress.  HENT:  Head: Normocephalic.  Eyes: No scleral icterus.  Neck: Normal range of motion. Neck supple. No JVD present. No tracheal deviation present.  Cardiovascular: Normal rate, regular rhythm and normal heart sounds.  Exam reveals no gallop and no friction rub.   No murmur heard. Pulmonary/Chest:  Effort normal and breath sounds normal. No respiratory distress. She has no wheezes. She has no rales. She exhibits no tenderness.  Abdominal: Soft. Bowel sounds are normal. She exhibits no distension and no mass. There is no tenderness. There is no rebound and no guarding.  Musculoskeletal: Normal range of motion. She exhibits no edema.  Neurological: She is alert and oriented to person, place, and time.  Skin: Skin is warm. No rash noted. No erythema.  Psychiatric: Memory, affect and judgment normal.      LABORATORY PANEL:   CBC  Recent Labs Lab 07/23/15 1832  WBC 6.9  HGB 12.4  HCT 37.0  PLT 198   ------------------------------------------------------------------------------------------------------------------  Chemistries   Recent Labs Lab 07/23/15 1832  NA 137  K 3.6  CL 103  CO2 27  GLUCOSE 107*  BUN 11  CREATININE 0.90  CALCIUM 8.5*   ------------------------------------------------------------------------------------------------------------------  Cardiac Enzymes No results for input(s): TROPONINI in the last 168 hours. ------------------------------------------------------------------------------------------------------------------  RADIOLOGY:  Dg Chest 2 View  07/23/2015  CLINICAL DATA:  Recent history of wheezing and bronchitis EXAM: CHEST  2 VIEW COMPARISON:  06/27/2013 FINDINGS: Cardiac shadow is within normal limits. The lungs are well aerated bilaterally. Increased degenerative kyphosis of the thoracic spine is seen. Mild interstitial changes are noted consistent with the given clinical history of bronchitis. No focal infiltrate is seen. IMPRESSION: Bronchitic changes without acute abnormality. Electronically Signed   By: Inez Catalina M.D.   On: 07/23/2015 18:52     ASSESSMENT AND PLAN:   45 year old female with a history of MR and asthma who presents with asthma exacerbation.   1. Acute asthma exacerbation  with acute hypoxic respiratory failure:  Patient is now weaned of oxygen and doing well.  She continues have some mild wheezing and therefore will continue steroids but will decrease IV steroids. Continue nebulizers and inhalers.  2. History of rheumatoid arthritis: Continue weekly methotrexate.   Management plans discussed with the patient and she is in agreement.  CODE STATUS: FULL  TOTAL TIME TAKING CARE OF THIS PATIENT: 30 minutes.     POSSIBLE D/C tomorrow, DEPENDING ON CLINICAL CONDITION.   Syd Manges M.D on 07/24/2015 at 10:18 AM  Between 7am to 6pm - Pager - [phone] After 6pm go to [email or web address] - password [email or web address] [facility]  Office  [phone]  CC: Primary care physician; Rica Mast, MD  Note: This dictation was prepared with Dragon dictation along with smaller phrase technology. Any transcriptional errors that result from this process are unintentional.

## 2015-07-24 NOTE — NC FL2 (Signed)
[place] LEVEL OF CARE SCREENING TOOL     IDENTIFICATION  Patient Name: Cindy Oconnor Birthdate: 1970/07/03 Sex: female Admission Date (Current Location): 07/23/2015  [place] and [place] Number:  Selena Lesser  ([id number] [facility]) Facility and Address:  [facility], [address] [place], [place] [address]      Provider Number: [id number]  Attending Physician Name and Address:  Bettey Costa, MD  Relative Name and Phone Number:       Current Level of Care: Hospital Recommended Level of Care: Other (Comment) ([place]) Prior Approval Number:    Date Approved/Denied:   PASRR Number:    Discharge Plan: Other (Comment) ([place] )    Current Diagnoses: Patient Active Problem List   Diagnosis Date Noted  . Asthma, chronic obstructive, with acute exacerbation ([place]) 07/23/2015  . Acute respiratory failure with hypoxia ([place]) 07/23/2015  . Asthma exacerbation 07/23/2015  . Routine general medical examination at a health care facility 01/24/2013  . Nonspecific abnormal electrocardiogram (ECG) (EKG) 01/24/2013  . Obesity (BMI 30-39.9) 01/21/2012  . Recurrent boils 03/12/2009  . ULCERATIVE COLITIS 06/11/2006  . Rheumatoid arthritis ([place]) 06/11/2006  . OSTEOPENIA 06/11/2006  . HYSTERECTOMY, HX OF 06/11/2006  . Hypothyroidism 03/18/2006  . Asthma, chronic 03/18/2006  . GERD 03/18/2006  . DOWN'S SYNDROME 03/18/2006    Orientation RESPIRATION BLADDER Height & Weight     Self, Time, Place  Normal Continent Weight: 165 lb 3.9 oz (74.954 kg) Height:  4' 9"  (144.8 cm)  BEHAVIORAL SYMPTOMS/MOOD NEUROLOGICAL BOWEL NUTRITION STATUS   (none )  (none ) Continent Diet (Regular Diet )  AMBULATORY STATUS COMMUNICATION OF NEEDS Skin   Independent Verbally Normal                       Personal Care Assistance Level of Assistance  Bathing, Feeding, Dressing Bathing Assistance: Independent Feeding assistance:  Independent Dressing Assistance: Independent     Functional Limitations Info  Sight, Hearing, Speech Sight Info: Impaired Hearing Info: Impaired Speech Info: Adequate    SPECIAL CARE FACTORS FREQUENCY                       Contractures      Additional Factors Info  Code Status, Allergies Code Status Info:  (Full Code. ) Allergies Info:  (Erythromycin, Albuterol)           Current Medications (07/24/2015):  This is the current hospital active medication list Current Facility-Administered Medications  Medication Dose Route Frequency Provider Last Rate Last Dose  . acetaminophen (TYLENOL) tablet 650 mg  650 mg Oral Q6H PRN Hillary Bow, MD   650 mg at 07/23/15 2310   Or  . acetaminophen (TYLENOL) suppository 650 mg  650 mg Rectal Q6H PRN Srikar Sudini, MD      . benzonatate (TESSALON) capsule 200 mg  200 mg Oral TID PRN Hillary Bow, MD      . budesonide-formoterol (SYMBICORT) 160-4.5 MCG/ACT inhaler 2 puff  2 puff Inhalation BID Hillary Bow, MD   2 puff at 07/24/15 5956  . docusate sodium (COLACE) capsule 100 mg  100 mg Oral BID Hillary Bow, MD   100 mg at 07/24/15 3875  . enoxaparin (LOVENOX) injection 40 mg  40 mg Subcutaneous Q24H Hillary Bow, MD   40 mg at 07/23/15 2248  . folic acid (FOLVITE) tablet 1 mg  1 mg Oral Daily Srikar Sudini, MD   1 mg at  07/24/15 1610  . ibuprofen (ADVIL,MOTRIN) tablet 400 mg  400 mg Oral Q6H PRN Srikar Sudini, MD      . ipratropium-albuterol (DUONEB) 0.5-2.5 (3) MG/3ML nebulizer solution 3 mL  3 mL Nebulization Q4H Srikar Sudini, MD   3 mL at 07/24/15 0748  . levothyroxine (SYNTHROID, LEVOTHROID) tablet 125 mcg  125 mcg Oral QAC breakfast Srikar Sudini, MD      . methylPREDNISolone sodium succinate (SOLU-MEDROL) 40 mg/mL injection 40 mg  40 mg Intravenous Q12H Sital Mody, MD      . ondansetron (ZOFRAN) tablet 4 mg  4 mg Oral Q6H PRN Srikar Sudini, MD       Or  . ondansetron (ZOFRAN) injection 4 mg  4 mg Intravenous Q6H PRN Srikar  Sudini, MD      . pantoprazole (PROTONIX) EC tablet 40 mg  40 mg Oral Daily Hillary Bow, MD   40 mg at 07/24/15 0927  . polyethylene glycol (MIRALAX / GLYCOLAX) packet 17 g  17 g Oral Daily PRN Hillary Bow, MD      . senna (SENOKOT) tablet 8.6 mg  1 tablet Oral QHS Srikar Sudini, MD   8.6 mg at 07/23/15 2248  . tiotropium (SPIRIVA) inhalation capsule 18 mcg  18 mcg Inhalation Daily Hillary Bow, MD   18 mcg at 07/24/15 9604     Discharge Medications: Please see discharge summary for a list of discharge medications.  Relevant Imaging Results:  Relevant Lab Results:   Additional Information  (SSN: [id number])  Loralyn Freshwater, LCSW

## 2015-07-24 NOTE — Telephone Encounter (Signed)
Amanda [phone] from Pharm care services called regarding pt clarification of medication methylPREDNISolone sodium succinate (SOLU-MEDROL) 40 mg/mL injection 40 mg. Thank you!

## 2015-07-25 ENCOUNTER — Ambulatory Visit: Payer: Medicare Other | Admitting: Sports Medicine

## 2015-07-25 MED ORDER — GUAIFENESIN 100 MG/5ML PO SOLN: 5.0000 mL | ORAL | Status: DC | PRN

## 2015-07-25 MED ORDER — PREDNISONE 10 MG PO TABS: 10.0000 mg | ORAL_TABLET | Freq: Every day | ORAL | Status: DC

## 2015-07-25 MED ORDER — PREDNISONE 2.5 MG PO TABS: 2.5000 mg | ORAL_TABLET | Freq: Every day | ORAL | Status: DC

## 2015-07-25 MED ORDER — DOXYCYCLINE HYCLATE 100 MG PO CAPS: 100.0000 mg | ORAL_CAPSULE | Freq: Two times a day (BID) | ORAL | Status: DC

## 2015-07-25 NOTE — Care Management Important Message (Signed)
Important Message  Patient Details  Name: Cindy Oconnor MRN: [id number] Date of Birth: 11-29-1970   Medicare Important Message Given:  Yes    Cherylin Waguespack A, RN 07/25/2015, 7:25 AM

## 2015-07-25 NOTE — Progress Notes (Signed)
Completed discharge instructions with patient's mother.  Previously reviewed new orders with Lottie Rater, RN at [facility].  Patient discharging home with mother for weekend and will return to [facility] on Sunday.

## 2015-07-25 NOTE — Progress Notes (Signed)
Patient is medically stable to D/C back to [facility] today. Clinical Education officer, museum (CSW) faxed D/C Summary, FL2 and prescriptions to [employer] [facility]. Per Butch Penny patient can return today. Patient's mother/guardian [name] is at bedside and reported that she will transport patient home today. CSW gave D/C Packet to [facility]. RN aware of above. Please reconsult if future social work needs arise. CSW signing off.   Blima Rich, LCSW [phone]

## 2015-07-25 NOTE — Care Management Note (Signed)
Case Management Note  Patient Details  Name: DEZYRAE KENSINGER MRN: [id number] Date of Birth: 10-Jul-1970  Subjective/Objective:    Discharge to home. No home health services ordered.                Action/Plan:   Expected Discharge Date:                  Expected Discharge Plan:     In-House Referral:     Discharge planning Services     Post Acute Care Choice:    Choice offered to:     DME Arranged:    DME Agency:     HH Arranged:    [place] Agency:     Status of Service:     Medicare Important Message Given:  Yes Date Medicare IM Given:    Medicare IM give by:    Date Additional Medicare IM Given:    Additional Medicare Important Message give by:     If discussed at [place] of Stay Meetings, dates discussed:    Additional Comments:  Larayne Baxley A, RN 07/25/2015, 9:43 AM

## 2015-07-25 NOTE — Discharge Summary (Signed)
[facility] [place] NAME: Cindy Oconnor    MR#:  [id number]  DATE OF BIRTH:  [birth year]  DATE OF ADMISSION:  07/23/2015 ADMITTING PHYSICIAN: Hillary Bow, MD  DATE OF DISCHARGE: 07/25/2015  PRIMARY CARE PHYSICIAN: Rica Mast, MD    ADMISSION DIAGNOSIS:  On supplemental oxygen therapy [Z99.81] Bronchitis, acute, with bronchospasm [J20.9] Asthma, moderate [J45.998]  DISCHARGE DIAGNOSIS:  Active Problems:   Asthma exacerbation   SECONDARY DIAGNOSIS:   Past Medical History  Diagnosis Date  . Asthma   . Arthritis   . Chicken pox   . RA (rheumatoid arthritis) ([place])   . Ulcerative colitis ([place])   . Deafness in left ear   . Down syndrome   . Blood clot in vein     HOSPITAL COURSE:   45 year old female with a history of MR and asthma who presents with asthma exacerbation.   1. Acute asthma exacerbation with acute hypoxic respiratory failure: Patient is now weaned of oxygen and doing well.  She continues have some mild wheezing and therefore will need to continue oral steroid taper. After surgery taper she will resume her chronic outpatient dose of 2.5 mg daily.   2. History of rheumatoid arthritis: Continue weekly methotrexate.    DISCHARGE CONDITIONS AND DIET:   Table for discharge on her heart healthy diet   CONSULTS OBTAINED:     DRUG ALLERGIES:   Allergies  Allergen Reactions  . Albuterol Other (See Comments)    Reaction:  Makes pt jittery   . Erythromycin Other (See Comments)    Reaction:  Unknown     DISCHARGE MEDICATIONS:   Current Discharge Medication List    START taking these medications   Details  doxycycline (VIBRAMYCIN) 100 MG capsule Take 1 capsule (100 mg total) by mouth 2 (two) times daily. Qty: 10 capsule, Refills: 0    guaiFENesin (ROBITUSSIN) 100 MG/5ML SOLN Take 5 mLs (100 mg total) by mouth every 4 (four) hours as needed for cough or to loosen phlegm. Qty: 1200  mL, Refills: 0      CONTINUE these medications which have CHANGED   Details  !! predniSONE (DELTASONE) 10 MG tablet Take 1 tablet (10 mg total) by mouth daily with breakfast. 60 mg PO (ORAL) x 2 days 50 mg PO x 2 days 40 mg PO x 2 days 30 mg PO x 2 days 20 mg PO x 2 days 10 mg PO x 2 days then resume 2.5 mg of prednisone that you were taking daily Qty: 42 tablet, Refills: 0    !! predniSONE (DELTASONE) 2.5 MG tablet Take 1 tablet (2.5 mg total) by mouth daily. Please start this again after steroid taper thanks Qty: 30 tablet, Refills: 2     !! - Potential duplicate medications found. Please discuss with provider.    CONTINUE these medications which have NOT CHANGED   Details  alendronate (FOSAMAX) 70 MG tablet Take 70 mg by mouth once a week. Take with a full glass of water on an empty stomach.    benzonatate (TESSALON) 100 MG capsule Take 100 mg by mouth 3 (three) times daily as needed for cough.    budesonide-formoterol (SYMBICORT) 160-4.5 MCG/ACT inhaler Inhale 2 puffs into the lungs 2 (two) times daily.    calcium carbonate (OSCAL) 1500 (600 Ca) MG TABS tablet Take 600 mg of elemental calcium by mouth daily.    carbamide peroxide (DEBROX) 6.5 % otic solution Place 5 drops into both ears  as needed (to remove earwax).    cholecalciferol (VITAMIN D) 1000 units tablet Take 1,000 Units by mouth daily.    diclofenac sodium (VOLTAREN) 1 % GEL Apply 2-4 g topically 4 (four) times daily as needed (for pain).    docusate sodium (COLACE) 100 MG capsule Take 100 mg by mouth every other day.    folic acid (FOLVITE) 1 MG tablet Take 1 tablet (1 mg total) by mouth daily. Qty: 30 tablet, Refills: 5    gentamicin ointment (GARAMYCIN) 0.1 % Apply 1 application topically 3 (three) times daily as needed (for rash).     guaifenesin (HUMIBID E) 400 MG TABS tablet Take 400 mg by mouth every 4 (four) hours as needed (for congestion).    ketoconazole (NIZORAL) 2 % cream Apply 1 application  topically daily. Qty: 60 g, Refills: 2    lactulose (CHRONULAC) 10 GM/15ML solution Take 30 g by mouth daily as needed for mild constipation.    levalbuterol (XOPENEX) 0.63 MG/3ML nebulizer solution Take 3 mLs (0.63 mg total) by nebulization 3 (three) times daily as needed for wheezing or shortness of breath. Qty: 3 mL, Refills: 6    levothyroxine (SYNTHROID, LEVOTHROID) 125 MCG tablet Take 125 mcg by mouth daily before breakfast.    methotrexate (RHEUMATREX) 2.5 MG tablet Take 17.5 mg by mouth once a week. Caution:Chemotherapy. Protect from light.    Multiple Vitamin (MULTIVITAMIN WITH MINERALS) TABS tablet Take 1 tablet by mouth daily.    omeprazole (PRILOSEC) 20 MG capsule Take 20 mg by mouth 2 (two) times daily.    ondansetron (ZOFRAN) 4 MG tablet Take 4 mg by mouth every 8 (eight) hours as needed for nausea or vomiting.     senna (SENEXON) 8.6 MG tablet Take 1 tablet (8.6 mg total) by mouth at bedtime. Qty: 30 tablet, Refills: 3    tiotropium (SPIRIVA) 18 MCG inhalation capsule Place 18 mcg into inhaler and inhale daily.      STOP taking these medications     ibuprofen (ADVIL,MOTRIN) 200 MG tablet      ibuprofen (ADVIL,MOTRIN) 200 MG tablet               Today   CHIEF COMPLAINT:   Patient with cough. She is off oxygen. No increased shortness of breath or chest pain.   VITAL SIGNS:  Blood pressure 82/60, pulse 69, temperature 96.8 F (36 C), temperature source Axillary, resp. rate 18, height 4' 9"  (1.448 m), weight 75.091 kg (165 lb 8.7 oz), last menstrual period 04/11/1981, SpO2 96 %.   REVIEW OF SYSTEMS:  Review of Systems  Constitutional: Negative for fever, chills and malaise/fatigue.  HENT: Negative for sore throat.   Eyes: Negative for blurred vision.  Respiratory: Positive for cough. Negative for hemoptysis, shortness of breath and wheezing.   Cardiovascular: Negative for chest pain, palpitations and leg swelling.  Gastrointestinal: Negative for  nausea, vomiting, abdominal pain, diarrhea and blood in stool.  Genitourinary: Negative for dysuria.  Musculoskeletal: Negative for back pain.  Neurological: Negative for dizziness, tremors and headaches.  Endo/Heme/Allergies: Does not bruise/bleed easily.     PHYSICAL EXAMINATION:  GENERAL:  45 y.o.-year-old patient lying in the bed with no acute distress.  NECK:  Supple, no jugular venous distention. No thyroid enlargement, no tenderness.  LUNGS: Bilateral mild wheezing without respiratory distress. No  rales,rhonchi  No use of accessory muscles of respiration.  CARDIOVASCULAR: S1, S2 normal. No murmurs, rubs, or gallops.  ABDOMEN: Soft, non-tender, non-distended. Bowel sounds present. No organomegaly  or mass.  EXTREMITIES: No pedal edema, cyanosis, or clubbing.  PSYCHIATRIC: The patient is alert and oriented x 3.  SKIN: No obvious rash, lesion, or ulcer.   DATA REVIEW:   CBC  Recent Labs Lab 07/23/15 1832  WBC 6.9  HGB 12.4  HCT 37.0  PLT 198    Chemistries   Recent Labs Lab 07/23/15 1832  NA 137  K 3.6  CL 103  CO2 27  GLUCOSE 107*  BUN 11  CREATININE 0.90  CALCIUM 8.5*    Cardiac Enzymes No results for input(s): TROPONINI in the last 168 hours.  Microbiology Results  @MICRORSLT48 @  RADIOLOGY:  Dg Chest 2 View  07/23/2015  CLINICAL DATA:  Recent history of wheezing and bronchitis EXAM: CHEST  2 VIEW COMPARISON:  06/27/2013 FINDINGS: Cardiac shadow is within normal limits. The lungs are well aerated bilaterally. Increased degenerative kyphosis of the thoracic spine is seen. Mild interstitial changes are noted consistent with the given clinical history of bronchitis. No focal infiltrate is seen. IMPRESSION: Bronchitic changes without acute abnormality. Electronically Signed   By: Inez Catalina M.D.   On: 07/23/2015 18:52      Management plans discussed with the patient and she is in agreement. Stable for discharge group home  Patient should follow up  with PCP in 1 week  CODE STATUS:     Code Status Orders        Start     Ordered   07/23/15 2035  Full code   Continuous     07/23/15 2035    Code Status History    Date Active Date Inactive Code Status Order ID Comments User Context   This patient has a current code status but no historical code status.      TOTAL TIME TAKING CARE OF THIS PATIENT: 35 minutes.    Note: This dictation was prepared with Dragon dictation along with smaller phrase technology. Any transcriptional errors that result from this process are unintentional.  Zhanna Melin M.D on 07/25/2015 at 10:45 AM  Between 7am to 6pm - Pager - [phone] After 6pm go to [email or web address] - password [email or web address] [facility]  Office  [phone]  CC: Primary care physician; Rica Mast, MD

## 2015-07-25 NOTE — NC FL2 (Signed)
[place] LEVEL OF CARE SCREENING TOOL     IDENTIFICATION  Patient Name: Cindy Oconnor Birthdate: July 02, 1970 Sex: female Admission Date (Current Location): 07/23/2015  [place] and [place] Number:  Selena Lesser  ([id number] [facility]) Facility and Address:  [facility], [address] [place], [place] [address]      Provider Number: [id number]  Attending Physician Name and Address:  Bettey Costa, MD  Relative Name and Phone Number:       Current Level of Care: Hospital Recommended Level of Care: Other (Comment) ([place]) Prior Approval Number:    Date Approved/Denied:   PASRR Number:    Discharge Plan: Other (Comment) ([place] )    Current Diagnoses: Patient Active Problem List   Diagnosis Date Noted  . Asthma, chronic obstructive, with acute exacerbation ([place]) 07/23/2015  . Acute respiratory failure with hypoxia ([place]) 07/23/2015  . Asthma exacerbation 07/23/2015  . Routine general medical examination at a health care facility 01/24/2013  . Nonspecific abnormal electrocardiogram (ECG) (EKG) 01/24/2013  . Obesity (BMI 30-39.9) 01/21/2012  . Recurrent boils 03/12/2009  . ULCERATIVE COLITIS 06/11/2006  . Rheumatoid arthritis ([place]) 06/11/2006  . OSTEOPENIA 06/11/2006  . HYSTERECTOMY, HX OF 06/11/2006  . Hypothyroidism 03/18/2006  . Asthma, chronic 03/18/2006  . GERD 03/18/2006  . DOWN'S SYNDROME 03/18/2006    Orientation RESPIRATION BLADDER Height & Weight     Self, Time, Place  Normal Continent Weight: 165 lb 8.7 oz (75.091 kg) Height:  4' 9"  (144.8 cm)  BEHAVIORAL SYMPTOMS/MOOD NEUROLOGICAL BOWEL NUTRITION STATUS   (none )  (none ) Continent Diet (Regular Diet )  AMBULATORY STATUS COMMUNICATION OF NEEDS Skin   Independent Verbally Normal                       Personal Care Assistance Level of Assistance  Bathing, Feeding, Dressing Bathing Assistance: Independent Feeding assistance:  Independent Dressing Assistance: Independent     Functional Limitations Info  Sight, Hearing, Speech Sight Info: Impaired Hearing Info: Impaired Speech Info: Adequate    SPECIAL CARE FACTORS FREQUENCY                       Contractures      Additional Factors Info  Code Status, Allergies Code Status Info:  (Full Code. ) Allergies Info:  (Erythromycin, Albuterol)           Discharge Medications: Please see discharge summary for a list of discharge medications. DISCHARGE MEDICATIONS:   Current Discharge Medication List    START taking these medications   Details  doxycycline (VIBRAMYCIN) 100 MG capsule Take 1 capsule (100 mg total) by mouth 2 (two) times daily. Qty: 10 capsule, Refills: 0    guaiFENesin (ROBITUSSIN) 100 MG/5ML SOLN Take 5 mLs (100 mg total) by mouth every 4 (four) hours as needed for cough or to loosen phlegm. Qty: 1200 mL, Refills: 0      CONTINUE these medications which have CHANGED   Details  !! predniSONE (DELTASONE) 10 MG tablet Take 1 tablet (10 mg total) by mouth daily with breakfast. 60 mg PO (ORAL) x 2 days 50 mg PO x 2 days 40 mg PO x 2 days 30 mg PO x 2 days 20 mg PO x 2 days 10 mg PO x 2 days then resume 2.5 mg of prednisone that you were taking daily Qty: 42 tablet, Refills: 0    !! predniSONE (DELTASONE) 2.5 MG tablet Take  1 tablet (2.5 mg total) by mouth daily. Please start this again after steroid taper thanks Qty: 30 tablet, Refills: 2    !! - Potential duplicate medications found. Please discuss with provider.    CONTINUE these medications which have NOT CHANGED   Details  alendronate (FOSAMAX) 70 MG tablet Take 70 mg by mouth once a week. Take with a full glass of water on an empty stomach.    benzonatate (TESSALON) 100 MG capsule Take 100 mg by mouth 3 (three) times daily as needed for cough.    budesonide-formoterol (SYMBICORT) 160-4.5 MCG/ACT inhaler Inhale 2 puffs into the  lungs 2 (two) times daily.    calcium carbonate (OSCAL) 1500 (600 Ca) MG TABS tablet Take 600 mg of elemental calcium by mouth daily.    carbamide peroxide (DEBROX) 6.5 % otic solution Place 5 drops into both ears as needed (to remove earwax).    cholecalciferol (VITAMIN D) 1000 units tablet Take 1,000 Units by mouth daily.    diclofenac sodium (VOLTAREN) 1 % GEL Apply 2-4 g topically 4 (four) times daily as needed (for pain).    docusate sodium (COLACE) 100 MG capsule Take 100 mg by mouth every other day.    folic acid (FOLVITE) 1 MG tablet Take 1 tablet (1 mg total) by mouth daily. Qty: 30 tablet, Refills: 5    gentamicin ointment (GARAMYCIN) 0.1 % Apply 1 application topically 3 (three) times daily as needed (for rash).     guaifenesin (HUMIBID E) 400 MG TABS tablet Take 400 mg by mouth every 4 (four) hours as needed (for congestion).    ketoconazole (NIZORAL) 2 % cream Apply 1 application topically daily. Qty: 60 g, Refills: 2    lactulose (CHRONULAC) 10 GM/15ML solution Take 30 g by mouth daily as needed for mild constipation.    levalbuterol (XOPENEX) 0.63 MG/3ML nebulizer solution Take 3 mLs (0.63 mg total) by nebulization 3 (three) times daily as needed for wheezing or shortness of breath. Qty: 3 mL, Refills: 6    levothyroxine (SYNTHROID, LEVOTHROID) 125 MCG tablet Take 125 mcg by mouth daily before breakfast.    methotrexate (RHEUMATREX) 2.5 MG tablet Take 17.5 mg by mouth once a week. Caution:Chemotherapy. Protect from light.    Multiple Vitamin (MULTIVITAMIN WITH MINERALS) TABS tablet Take 1 tablet by mouth daily.    omeprazole (PRILOSEC) 20 MG capsule Take 20 mg by mouth 2 (two) times daily.    ondansetron (ZOFRAN) 4 MG tablet Take 4 mg by mouth every 8 (eight) hours as needed for nausea or vomiting.     senna (SENEXON) 8.6 MG tablet Take 1 tablet (8.6 mg total) by mouth at bedtime. Qty: 30 tablet, Refills: 3     tiotropium (SPIRIVA) 18 MCG inhalation capsule Place 18 mcg into inhaler and inhale daily.         Relevant Imaging Results:  Relevant Lab Results:   Additional Information  (SSN: [id number])  Loralyn Freshwater, LCSW

## 2015-07-28 ENCOUNTER — Telehealth: Payer: Self-pay | Admitting: Internal Medicine

## 2015-07-28 NOTE — Telephone Encounter (Signed)
Please advise, thanks.

## 2015-07-28 NOTE — Telephone Encounter (Signed)
Pt caregiver called about pt still having some coughing and wheezing with chest congestion and chest pain from coughing. And still feeling bad. Pt was seen in the office on 07/23/2015 and sent to ED.  Pt is still taking the antibiotic with the nebulizer with prednisone.  Do you recommend pt coming back in? Call Ms Morris @ [phone]. Thank you!

## 2015-07-28 NOTE — Telephone Encounter (Signed)
Can she come in at 2pm today? We had a cancellation

## 2015-07-29 NOTE — Telephone Encounter (Signed)
Pt coming in 07/30/15

## 2015-07-30 ENCOUNTER — Ambulatory Visit (INDEPENDENT_AMBULATORY_CARE_PROVIDER_SITE_OTHER): Payer: Medicare Other | Admitting: Internal Medicine

## 2015-07-30 ENCOUNTER — Encounter: Payer: Self-pay | Admitting: Internal Medicine

## 2015-07-30 VITALS — BP 107/66 | HR 77 | Temp 98.6°F | Ht <= 58 in | Wt 164.0 lb

## 2015-07-30 DIAGNOSIS — J441 Chronic obstructive pulmonary disease with (acute) exacerbation: Secondary | ICD-10-CM

## 2015-07-30 DIAGNOSIS — J45901 Unspecified asthma with (acute) exacerbation: Secondary | ICD-10-CM

## 2015-07-30 NOTE — Patient Instructions (Signed)
Continue Xopenex nebs 3 times daily.  Stay out of work until Monday 08/04/15.

## 2015-07-30 NOTE — Progress Notes (Signed)
Subjective:    Patient ID: Cindy Oconnor, female    DOB: [birth year], 45 y.o.   MRN: [id number]  HPI  45YO female presents for hospital followup.  ADMITTED 07/23/2015  DISCHARGED 07/25/2015   DIAGNOSIS: Bronchitis, hypoxia  Seen in clinic last week with bronchitis, respiratory distress. Sent by EMS [facility].  Treated with IV steroids and bronchodilators. Transitioned to oral steroids. CXR showed bronchitis only.  Still coughing some, but improved. Back in group home yesterday. Doing nebulizer 3 x per day and Tessalon perles. No fever. No dyspnea noted.   Wt Readings from Last 3 Encounters:  07/30/15 164 lb (74.39 kg)  07/25/15 165 lb 8.7 oz (75.091 kg)  07/23/15 166 lb (75.297 kg)   BP Readings from Last 3 Encounters:  07/30/15 107/66  07/25/15 102/63  07/23/15 100/68    Past Medical History  Diagnosis Date  . Asthma   . Arthritis   . Chicken pox   . RA (rheumatoid arthritis) ([place])   . Ulcerative colitis ([place])   . Deafness in left ear   . Down syndrome   . Blood clot in vein    Family History  Problem Relation Age of Onset  . Heart disease Mother   . Diabetes Mother   . Heart disease Father   . Diabetes Father   . Alcohol abuse Maternal Grandmother   . Arthritis Maternal Grandmother   . Stroke Maternal Grandmother   . Diabetes Maternal Grandmother   . Alcohol abuse Maternal Grandfather   . Arthritis Maternal Grandfather   . Stroke Maternal Grandfather   . Diabetes Maternal Grandfather    Past Surgical History  Procedure Laterality Date  . Abdominal hysterectomy  1992    partial  . Foot surgery      x2   Social History   Social History  . Marital Status: Single    Spouse Name: N/A  . Number of Children: N/A  . Years of Education: N/A   Social History Main Topics  . Smoking status: Never Smoker   . Smokeless tobacco: None  . Alcohol Use: No  . Drug Use: None  . Sexual Activity: Not Asked   Other Topics Concern  . None   Social History  Narrative   Lives at [facility] group home.    Review of Systems  Constitutional: Negative for fever, chills and unexpected weight change.  HENT: Negative for congestion, ear discharge, ear pain, facial swelling, hearing loss, mouth sores, nosebleeds, postnasal drip, rhinorrhea, sinus pressure, sneezing, sore throat, tinnitus, trouble swallowing and voice change.   Eyes: Negative for pain, discharge, redness and visual disturbance.  Respiratory: Positive for cough. Negative for chest tightness, shortness of breath, wheezing and stridor.   Cardiovascular: Negative for chest pain, palpitations and leg swelling.  Musculoskeletal: Negative for myalgias, arthralgias, neck pain and neck stiffness.  Skin: Negative for color change and rash.  Neurological: Negative for dizziness, weakness, light-headedness and headaches.  Hematological: Negative for adenopathy.       Objective:    BP 107/66 mmHg  Pulse 77  Temp(Src) 98.6 F (37 C) (Oral)  Ht 4' 7"  (1.397 m)  Wt 164 lb (74.39 kg)  BMI 38.12 kg/m2  SpO2 95%  LMP 04/11/1981 Physical Exam  Constitutional: She is oriented to person, place, and time. She appears well-developed and well-nourished. No distress.  HENT:  Head: Normocephalic and atraumatic.  Right Ear: External ear normal.  Left Ear: External ear normal.  Nose: Nose normal.  Mouth/Throat: Oropharynx is clear and moist. No oropharyngeal exudate.  Eyes: Conjunctivae are normal. Pupils are equal, round, and reactive to light. Right eye exhibits no discharge. Left eye exhibits no discharge. No scleral icterus.  Neck: Normal range of motion. Neck supple. No tracheal deviation present. No thyromegaly present.  Cardiovascular: Normal rate, regular rhythm, normal heart sounds and intact distal pulses.  Exam reveals no gallop and no friction rub.   No murmur heard. Pulmonary/Chest: Effort normal. No accessory muscle usage. No respiratory distress. She has no decreased  breath sounds. She has wheezes (few scattered expiratory wheezes). She has rhonchi (rare). She has no rales. She exhibits no tenderness.  Musculoskeletal: Normal range of motion. She exhibits no edema or tenderness.  Lymphadenopathy:    She has no cervical adenopathy.  Neurological: She is alert and oriented to person, place, and time. No cranial nerve deficit. She exhibits normal muscle tone. Coordination normal.  Skin: Skin is warm and dry. No rash noted. She is not diaphoretic. No erythema. No pallor.  Psychiatric: She has a normal mood and affect. Her behavior is normal. Judgment and thought content normal.          Assessment & Plan:   Problem List Items Addressed This Visit      Unprioritized   Asthma, chronic obstructive, with acute exacerbation ([place]) - Primary    Symptoms improving. Exam improved. Will continue Xopenex tid. Complete Doxycycline and Prednisone taper. Follow up in 4 weeks for recheck and sooner as needed.          Return in about 4 weeks (around 08/27/2015) for Recheck.  Ronette Deter, MD Internal Medicine [facility]

## 2015-07-30 NOTE — Assessment & Plan Note (Signed)
Symptoms improving. Exam improved. Will continue Xopenex tid. Complete Doxycycline and Prednisone taper. Follow up in 4 weeks for recheck and sooner as needed.

## 2015-07-30 NOTE — Progress Notes (Signed)
Pre visit review using our clinic review tool, if applicable. No additional management support is needed unless otherwise documented below in the visit note. 

## 2015-08-05 ENCOUNTER — Telehealth: Payer: Self-pay

## 2015-08-05 NOTE — Telephone Encounter (Signed)
Fine to change to Spiriva Respimat

## 2015-08-05 NOTE — Telephone Encounter (Signed)
PA for Spiriva completed over the phone.  Per the Pharmacist that reviewed the case, this specific form the handihaler was recently indicated for only COPD management per the FDA.  So it has been denied as she doesn't have a COPD diagnosis.  There suggestion was Spiriva respimat which will need a PA completed also.  Please advise what you would like me to do, thanks

## 2015-08-06 ENCOUNTER — Other Ambulatory Visit: Payer: Self-pay

## 2015-08-06 MED ORDER — TIOTROPIUM BROMIDE MONOHYDRATE 1.25 MCG/ACT IN AERS: 2.5000 ug | INHALATION_SPRAY | Freq: Every day | RESPIRATORY_TRACT | Status: DC

## 2015-08-06 NOTE — Telephone Encounter (Signed)
Spoke with PA rep. Aber, new drug/dose approved from 05/08/2015-08/05/2016.  Approved under diagnosis code J44.1.  Case number [id number]

## 2015-08-06 NOTE — Telephone Encounter (Signed)
Changed order per Providers request, Spiriva Respimat inhaler 1.28mg. Starting new PA for this. thanks

## 2015-08-11 ENCOUNTER — Telehealth: Payer: Self-pay | Admitting: Internal Medicine

## 2015-08-11 NOTE — Telephone Encounter (Signed)
Pt mom called about pt needing a referral to the Orthopedic due to both hips paining her when she walks for about 1 month now getting worse. Call mom @ [phone]. Thank you!

## 2015-08-11 NOTE — Telephone Encounter (Signed)
Please advise referral?

## 2015-08-11 NOTE — Telephone Encounter (Signed)
Has she recently seen her rheumatologist? They would want to see her first.

## 2015-08-11 NOTE — Telephone Encounter (Signed)
Spoke with the mom and she has an appt on the 31st at [facility] in [place].  She said that is fine, she will let us know if we can help after that. Thanks

## 2015-08-14 ENCOUNTER — Other Ambulatory Visit: Payer: Self-pay

## 2015-08-14 MED ORDER — CALCIUM CARBONATE 1500 (600 CA) MG PO TABS: 600.0000 mg | ORAL_TABLET | Freq: Every day | ORAL | Status: DC

## 2015-08-15 ENCOUNTER — Other Ambulatory Visit: Payer: Self-pay | Admitting: Internal Medicine

## 2015-08-15 ENCOUNTER — Ambulatory Visit
Admission: RE | Admit: 2015-08-15 | Discharge: 2015-08-15 | Disposition: A | Discharge status: Home / Self Care | Payer: Medicare Other | Source: Ambulatory Visit | Attending: Internal Medicine | Admitting: Internal Medicine

## 2015-08-15 DIAGNOSIS — Z1231 Encounter for screening mammogram for malignant neoplasm of breast: Secondary | ICD-10-CM

## 2015-08-15 DIAGNOSIS — Z1239 Encounter for other screening for malignant neoplasm of breast: Secondary | ICD-10-CM

## 2015-08-26 ENCOUNTER — Ambulatory Visit (INDEPENDENT_AMBULATORY_CARE_PROVIDER_SITE_OTHER): Payer: Medicare Other | Admitting: Internal Medicine

## 2015-08-26 ENCOUNTER — Encounter: Payer: Self-pay | Admitting: Internal Medicine

## 2015-08-26 VITALS — BP 94/64 | HR 82 | Temp 98.3°F | Ht <= 58 in | Wt 166.2 lb

## 2015-08-26 DIAGNOSIS — M069 Rheumatoid arthritis, unspecified: Secondary | ICD-10-CM | POA: Diagnosis not present

## 2015-08-26 DIAGNOSIS — R1310 Dysphagia, unspecified: Secondary | ICD-10-CM

## 2015-08-26 DIAGNOSIS — J45909 Unspecified asthma, uncomplicated: Secondary | ICD-10-CM | POA: Diagnosis not present

## 2015-08-26 NOTE — Progress Notes (Signed)
Subjective:    Patient ID: Cindy Oconnor, female    DOB: 08-17-1970, 45 y.o.   MRN: [id number]  HPI  45YO female presents for follow up.  Last seen for hospital follow up after hospitalization for asthma exacerbation.  Asthma - No recent shortness of breath. Using Xopenex prn, only rarely. Taking Symbicort and Spiriva regularly.  Has evaluation this week scheduled at [facility] for her hip pain. Also followed by rheumatology. Continues daily low dose prednisone.  Having some trouble swallowing her MVI. Feels like it gets stuck in back of throat. No issues with food or other pills.     Wt Readings from Last 3 Encounters:  08/26/15 166 lb 4 oz (75.411 kg)  07/30/15 164 lb (74.39 kg)  07/25/15 165 lb 8.7 oz (75.091 kg)   BP Readings from Last 3 Encounters:  08/26/15 94/64  07/30/15 107/66  07/25/15 102/63    Past Medical History  Diagnosis Date  . Asthma   . Arthritis   . Chicken pox   . RA (rheumatoid arthritis) ([place])   . Ulcerative colitis ([place])   . Deafness in left ear   . Down syndrome   . Blood clot in vein    Family History  Problem Relation Age of Onset  . Heart disease Mother   . Diabetes Mother   . Heart disease Father   . Diabetes Father   . Alcohol abuse Maternal Grandmother   . Arthritis Maternal Grandmother   . Stroke Maternal Grandmother   . Diabetes Maternal Grandmother   . Alcohol abuse Maternal Grandfather   . Arthritis Maternal Grandfather   . Stroke Maternal Grandfather   . Diabetes Maternal Grandfather    Past Surgical History  Procedure Laterality Date  . Abdominal hysterectomy  1992    partial  . Foot surgery      x2   Social History   Social History  . Marital Status: Single    Spouse Name: N/A  . Number of Children: N/A  . Years of Education: N/A   Social History Main Topics  . Smoking status: Never Smoker   . Smokeless tobacco: None  . Alcohol Use: No  . Drug Use: None  . Sexual Activity: Not Asked   Other Topics  Concern  . None   Social History Narrative   Lives at [facility] group home.    Review of Systems  Constitutional: Negative for fever, chills, appetite change, fatigue and unexpected weight change.  Eyes: Negative for visual disturbance.  Respiratory: Negative for cough, chest tightness, shortness of breath and wheezing.   Cardiovascular: Negative for chest pain and leg swelling.  Gastrointestinal: Negative for nausea, vomiting, abdominal pain, diarrhea and constipation.  Musculoskeletal: Positive for myalgias, back pain, arthralgias and gait problem.  Skin: Negative for color change and rash.  Hematological: Negative for adenopathy. Does not bruise/bleed easily.  Psychiatric/Behavioral: Negative for sleep disturbance and dysphoric mood. The patient is not nervous/anxious.        Objective:    BP 94/64 mmHg  Pulse 82  Temp(Src) 98.3 F (36.8 C) (Oral)  Ht 4' 7"  (1.397 m)  Wt 166 lb 4 oz (75.411 kg)  BMI 38.64 kg/m2  SpO2 97%  LMP 04/11/1981 Physical Exam  Constitutional: She is oriented to person, place, and time. She appears well-developed and well-nourished. No distress.  HENT:  Head: Normocephalic and atraumatic.  Right Ear: External ear normal.  Left Ear: External ear normal.  Nose: Nose normal.  Mouth/Throat:  Oropharynx is clear and moist. No oropharyngeal exudate.  Eyes: Conjunctivae are normal. Pupils are equal, round, and reactive to light. Right eye exhibits no discharge. Left eye exhibits no discharge. No scleral icterus.  Neck: Normal range of motion. Neck supple. No tracheal deviation present. No thyromegaly present.  Cardiovascular: Normal rate, regular rhythm, normal heart sounds and intact distal pulses.  Exam reveals no gallop and no friction rub.   No murmur heard. Pulmonary/Chest: Effort normal and breath sounds normal. No accessory muscle usage. No tachypnea. No respiratory distress. She has no decreased breath sounds. She has no wheezes.  She has no rhonchi. She has no rales. She exhibits no tenderness.  Musculoskeletal: Normal range of motion. She exhibits no edema or tenderness.  Lymphadenopathy:    She has no cervical adenopathy.  Neurological: She is alert and oriented to person, place, and time. No cranial nerve deficit. She exhibits normal muscle tone. Coordination normal.  Skin: Skin is warm and dry. No rash noted. She is not diaphoretic. No erythema. No pallor.  Psychiatric: She has a normal mood and affect. Her behavior is normal. Judgment and thought content normal.          Assessment & Plan:   Problem List Items Addressed This Visit      Unprioritized   Asthma, chronic - Primary    Symptoms improved after recent exacerbation. Continue Symbicort and Spiriva. Xopenex prn. Follow up prn and in 3 months.      Dysphagia    Recent dysphagia with taking large multivitamin tablet. No issues with other medications or food. Will try changing this to a chewable tablet. Follow up prn if symptoms not improving.      Rheumatoid arthritis ([place])    Follow up pending with rheumatology and ortho. Continues on Prednisone 2.31m daily and Methotrexate. Will follow.          Return in about 3 months (around 11/26/2015) for Recheck.  JRonette Deter MD Internal Medicine LDu [facility]

## 2015-08-26 NOTE — Assessment & Plan Note (Signed)
Follow up pending with rheumatology and ortho. Continues on Prednisone 2.36m daily and Methotrexate. Will follow.

## 2015-08-26 NOTE — Assessment & Plan Note (Signed)
Symptoms improved after recent exacerbation. Continue Symbicort and Spiriva. Xopenex prn. Follow up prn and in 3 months.

## 2015-08-26 NOTE — Progress Notes (Signed)
Pre visit review using our clinic review tool, if applicable. No additional management support is needed unless otherwise documented below in the visit note. 

## 2015-08-26 NOTE — Assessment & Plan Note (Signed)
Recent dysphagia with taking large multivitamin tablet. No issues with other medications or food. Will try changing this to a chewable tablet. Follow up prn if symptoms not improving.

## 2015-08-27 ENCOUNTER — Other Ambulatory Visit: Payer: Self-pay

## 2015-08-27 MED ORDER — ONE-A-DAY VITACRAVES PO CHEW: CHEWABLE_TABLET | ORAL | Status: DC

## 2015-08-27 NOTE — Telephone Encounter (Signed)
Pharmacy is requesting a prescripton for multivitamin gummi, the pharmacy does not have a regular chewables. Please advise okay to fill, Thanks

## 2015-08-28 DIAGNOSIS — M064 Inflammatory polyarthropathy: Secondary | ICD-10-CM | POA: Diagnosis not present

## 2015-08-28 DIAGNOSIS — M179 Osteoarthritis of knee, unspecified: Secondary | ICD-10-CM | POA: Diagnosis not present

## 2015-08-28 DIAGNOSIS — M0609 Rheumatoid arthritis without rheumatoid factor, multiple sites: Secondary | ICD-10-CM | POA: Diagnosis not present

## 2015-08-28 DIAGNOSIS — M4126 Other idiopathic scoliosis, lumbar region: Secondary | ICD-10-CM | POA: Diagnosis not present

## 2015-08-28 DIAGNOSIS — Z79899 Other long term (current) drug therapy: Secondary | ICD-10-CM | POA: Diagnosis not present

## 2015-08-28 DIAGNOSIS — M25551 Pain in right hip: Secondary | ICD-10-CM | POA: Diagnosis not present

## 2015-08-28 DIAGNOSIS — M25552 Pain in left hip: Secondary | ICD-10-CM | POA: Diagnosis not present

## 2015-08-29 ENCOUNTER — Telehealth: Payer: Self-pay

## 2015-08-29 NOTE — Telephone Encounter (Signed)
I would monitor for now.

## 2015-08-29 NOTE — Telephone Encounter (Signed)
Spoke with [facility], they were mistaken and the discussion occurred to possibly take the patient off the medication at the Rheumatoid doctor's appt yesterday.  But they have decided to keep her on the Prednisone at this time.

## 2015-08-29 NOTE — Telephone Encounter (Signed)
Cindy Oconnor states that Rhuemetology has taken [place] completely off her predniSONE (DELTASONE) 2.5 MG tablet and questions if that is okay since she has complications with asthma. Please advise, thanks

## 2015-09-15 ENCOUNTER — Other Ambulatory Visit: Payer: Self-pay

## 2015-09-15 MED ORDER — ALENDRONATE SODIUM 70 MG PO TABS: 70.0000 mg | ORAL_TABLET | ORAL | Status: DC

## 2015-09-15 MED ORDER — OMEPRAZOLE 20 MG PO CPDR: 20.0000 mg | DELAYED_RELEASE_CAPSULE | Freq: Two times a day (BID) | ORAL | Status: DC

## 2015-09-15 MED ORDER — DOCUSATE SODIUM 100 MG PO CAPS
100.0000 mg | ORAL_CAPSULE | ORAL | Status: DC
Start: 2015-09-15 — End: 2016-01-07

## 2015-09-15 MED ORDER — METHOTREXATE 2.5 MG PO TABS: 17.5000 mg | ORAL_TABLET | ORAL | Status: DC

## 2015-09-15 MED ORDER — LEVOTHYROXINE SODIUM 125 MCG PO TABS: 125.0000 ug | ORAL_TABLET | Freq: Every day | ORAL | Status: DC

## 2015-09-15 MED ORDER — VITAMIN D 1000 UNITS PO TABS: 1000.0000 [IU] | ORAL_TABLET | Freq: Every day | ORAL | Status: DC

## 2015-09-18 ENCOUNTER — Telehealth: Payer: Self-pay | Admitting: Internal Medicine

## 2015-09-18 MED ORDER — BUDESONIDE-FORMOTEROL FUMARATE 160-4.5 MCG/ACT IN AERO
2.0000 | INHALATION_SPRAY | Freq: Two times a day (BID) | RESPIRATORY_TRACT | Status: DC
Start: 2015-09-18 — End: 2016-03-03

## 2015-09-18 NOTE — Telephone Encounter (Signed)
Patient needing inhaler filled today .   budesonide-formoterol (SYMBICORT) 160-4.5 MCG/ACT inhaler

## 2015-09-18 NOTE — Telephone Encounter (Signed)
Refilled per request. Thanks

## 2015-09-26 ENCOUNTER — Encounter: Payer: Self-pay | Admitting: Sports Medicine

## 2015-09-26 ENCOUNTER — Ambulatory Visit (INDEPENDENT_AMBULATORY_CARE_PROVIDER_SITE_OTHER): Payer: Medicare Other | Admitting: Sports Medicine

## 2015-09-26 DIAGNOSIS — K5190 Ulcerative colitis, unspecified, without complications: Secondary | ICD-10-CM | POA: Insufficient documentation

## 2015-09-26 DIAGNOSIS — B029 Zoster without complications: Secondary | ICD-10-CM | POA: Insufficient documentation

## 2015-09-26 DIAGNOSIS — M79673 Pain in unspecified foot: Secondary | ICD-10-CM | POA: Diagnosis not present

## 2015-09-26 DIAGNOSIS — L84 Corns and callosities: Secondary | ICD-10-CM | POA: Diagnosis not present

## 2015-09-26 DIAGNOSIS — B353 Tinea pedis: Secondary | ICD-10-CM

## 2015-09-26 DIAGNOSIS — I82409 Acute embolism and thrombosis of unspecified deep veins of unspecified lower extremity: Secondary | ICD-10-CM | POA: Insufficient documentation

## 2015-09-26 DIAGNOSIS — J45909 Unspecified asthma, uncomplicated: Secondary | ICD-10-CM | POA: Insufficient documentation

## 2015-09-26 DIAGNOSIS — B351 Tinea unguium: Secondary | ICD-10-CM

## 2015-09-26 NOTE — Progress Notes (Signed)
Patient ID: Cindy Oconnor, female   DOB: [birth year], 45 y.o.   MRN: [id number]  Subjective: Cindy Oconnor is a 45 y.o. female patient seen today in office with complaint of painful thickened and elongated toenails; unable to trim and callus skin. Reports that she has been using cream some days  to bottom of feet with improvement. Patient has no other pedal complaints at this time.   Patient is assisted by facility caregiver.   Patient Active Problem List   Diagnosis Date Noted  . Airway hyperreactivity 09/26/2015  . Deep vein thrombosis (DVT) ([place]) 09/26/2015  . Herpes zona 09/26/2015  . Chronic ulcerative colitis ([place]) 09/26/2015  . Dysphagia 08/26/2015  . Triggering of digit 07/09/2014  . Lymphocytic colitis 01/30/2014  . Arthritis 01/30/2014  . Arthritis, degenerative 01/30/2014  . OP (osteoporosis) 01/30/2014  . Abnormal ECG 02/13/2013  . TI (tricuspid incompetence) 02/13/2013  . Routine general medical examination at a health care facility 01/24/2013  . Nonspecific abnormal electrocardiogram (ECG) (EKG) 01/24/2013  . Obesity (BMI 30-39.9) 01/21/2012  . Recurrent boils 03/12/2009  . ULCERATIVE COLITIS 06/11/2006  . Rheumatoid arthritis ([place]) 06/11/2006  . OSTEOPENIA 06/11/2006  . HYSTERECTOMY, HX OF 06/11/2006  . Hypothyroidism 03/18/2006  . Asthma, chronic 03/18/2006  . GERD 03/18/2006  . Down's syndrome 03/18/2006   Current Outpatient Prescriptions on File Prior to Visit  Medication Sig Dispense Refill  . alendronate (FOSAMAX) 70 MG tablet Take 1 tablet (70 mg total) by mouth once a week. Take with a full glass of water on an empty stomach. 4 tablet 3  . benzonatate (TESSALON) 100 MG capsule Take 100 mg by mouth 3 (three) times daily as needed for cough.    . budesonide-formoterol (SYMBICORT) 160-4.5 MCG/ACT inhaler Inhale 2 puffs into the lungs 2 (two) times daily. 1 Inhaler 3  . calcium carbonate (OSCAL) 1500 (600 Ca) MG TABS tablet Take 1 tablet (1,500 mg  total) by mouth daily. 30 tablet 4  . carbamide peroxide (DEBROX) 6.5 % otic solution Place 5 drops into both ears as needed (to remove earwax).    . cholecalciferol (VITAMIN D) 1000 units tablet Take 1 tablet (1,000 Units total) by mouth daily. 30 tablet 3  . diclofenac sodium (VOLTAREN) 1 % GEL Apply 2-4 g topically 4 (four) times daily as needed (for pain).    Marland Kitchen docusate sodium (COLACE) 100 MG capsule Take 1 capsule (100 mg total) by mouth every other day. 10 capsule 3  . folic acid (FOLVITE) 1 MG tablet Take 1 tablet (1 mg total) by mouth daily. 30 tablet 5  . gentamicin ointment (GARAMYCIN) 0.1 % Apply 1 application topically 3 (three) times daily as needed (for rash).     Marland Kitchen guaifenesin (HUMIBID E) 400 MG TABS tablet Take 400 mg by mouth every 4 (four) hours as needed (for congestion).    Marland Kitchen guaiFENesin (ROBITUSSIN) 100 MG/5ML SOLN Take 5 mLs (100 mg total) by mouth every 4 (four) hours as needed for cough or to loosen phlegm. 1200 mL 0  . ketoconazole (NIZORAL) 2 % cream Apply 1 application topically daily. 60 g 2  . lactulose (CHRONULAC) 10 GM/15ML solution Take 30 g by mouth daily as needed for mild constipation.    Marland Kitchen levalbuterol (XOPENEX) 0.63 MG/3ML nebulizer solution Take 3 mLs (0.63 mg total) by nebulization 3 (three) times daily as needed for wheezing or shortness of breath. 3 mL 6  . levothyroxine (SYNTHROID, LEVOTHROID) 125 MCG tablet Take 1 tablet (125 mcg total) by  mouth daily before breakfast. 30 tablet 3  . methotrexate (RHEUMATREX) 2.5 MG tablet Take 7 tablets (17.5 mg total) by mouth once a week. Caution:Chemotherapy. Protect from light. 28 tablet 3  . Multiple Vitamin (MULTIVITAMIN WITH MINERALS) TABS tablet Take 1 tablet by mouth daily.    . Multiple Vitamin (THEREMS) TABS     . Multiple Vitamins-Minerals (ONE-A-DAY VITACRAVES) CHEW Two chewables daily 70 tablet 6  . omeprazole (PRILOSEC) 20 MG capsule Take 1 capsule (20 mg total) by mouth 2 (two) times daily. 60 capsule 3  .  ondansetron (ZOFRAN) 4 MG tablet Take 4 mg by mouth every 8 (eight) hours as needed for nausea or vomiting.     . predniSONE (DELTASONE) 2.5 MG tablet Take 1 tablet (2.5 mg total) by mouth daily. Please start this again after steroid taper thanks 30 tablet 2  . senna (SENEXON) 8.6 MG tablet Take 1 tablet (8.6 mg total) by mouth at bedtime. 30 tablet 3  . Tiotropium Bromide Monohydrate (SPIRIVA RESPIMAT) 1.25 MCG/ACT AERS Inhale 2.5 mcg into the lungs daily. Use two puffs daily 4 g 5   No current facility-administered medications on file prior to visit.   Allergies  Allergen Reactions  . Albuterol Other (See Comments)    Reaction:  Makes pt jittery   . Erythromycin Other (See Comments)    Reaction:  Unknown     Objective: Physical Exam  General: Well developed, nourished, no acute distress, awake, alert and oriented x 3  Vascular: Dorsalis pedis artery 1/4 bilateral, Posterior tibial artery 2/4 bilateral, skin temperature warm to warm proximal to distal bilateral lower extremities, no varicosities, pedal hair present bilateral.  Neurological: Gross sensation present via light touch bilateral.   Dermatological: Skin is warm, dry, and supple bilateral, Nails 1-10 are tender, long, thick, and discolored with mild subungal debris, no webspace macerations present bilateral, no open lesions present bilateral, resolved plantar scaling, + hyperkeratotic tissue present bilateral to right heel, right 1st mtpj and left 2nd toe with no signs of infection bilateral.  Musculoskeletal: Asymptomatic hammertoes with mild subluxation deformities noted bilateral. Muscular strength within normal limits without pain or limitation on range of motion. No pain with calf compression bilateral.  Assessment and Plan:  Problem List Items Addressed This Visit    None    Visit Diagnoses    Dermatophytosis of nail    -  Primary    Corns and callosities        Tinea pedis of both feet        improved    Foot  pain, unspecified laterality          -Examined patient.  -Discussed treatment options for painful mycotic nails and callus skin. -Mechanically debrided and reduced mycotic nails with sterile nail nipper and dremel nail file without incident. -Debrided callus x 3 using sterile chisel blade without incident.  -Recommend continue with Ketoconazole daily until completed to prevent recurrence  -Continue with good supportive shoes daily -Patient to return in 3 months for follow up evaluation or sooner if symptoms worsen.  Landis Martins, DPM

## 2015-09-30 DIAGNOSIS — R262 Difficulty in walking, not elsewhere classified: Secondary | ICD-10-CM | POA: Diagnosis not present

## 2015-09-30 DIAGNOSIS — M069 Rheumatoid arthritis, unspecified: Secondary | ICD-10-CM | POA: Diagnosis not present

## 2015-09-30 DIAGNOSIS — M6281 Muscle weakness (generalized): Secondary | ICD-10-CM | POA: Diagnosis not present

## 2015-10-03 DIAGNOSIS — R262 Difficulty in walking, not elsewhere classified: Secondary | ICD-10-CM | POA: Diagnosis not present

## 2015-10-03 DIAGNOSIS — M6281 Muscle weakness (generalized): Secondary | ICD-10-CM | POA: Diagnosis not present

## 2015-10-03 DIAGNOSIS — M069 Rheumatoid arthritis, unspecified: Secondary | ICD-10-CM | POA: Diagnosis not present

## 2015-10-09 DIAGNOSIS — M069 Rheumatoid arthritis, unspecified: Secondary | ICD-10-CM | POA: Diagnosis not present

## 2015-10-09 DIAGNOSIS — R262 Difficulty in walking, not elsewhere classified: Secondary | ICD-10-CM | POA: Diagnosis not present

## 2015-10-09 DIAGNOSIS — M6281 Muscle weakness (generalized): Secondary | ICD-10-CM | POA: Diagnosis not present

## 2015-10-14 ENCOUNTER — Ambulatory Visit: Payer: Medicare Other | Admitting: Sports Medicine

## 2015-10-16 ENCOUNTER — Other Ambulatory Visit: Payer: Self-pay | Admitting: *Deleted

## 2015-10-16 NOTE — Telephone Encounter (Signed)
[facility] requested a refill methtrexate

## 2015-10-17 MED ORDER — METHOTREXATE 2.5 MG PO TABS: 17.5000 mg | ORAL_TABLET | ORAL | Status: DC

## 2015-10-17 NOTE — Telephone Encounter (Signed)
Please advise 

## 2015-10-21 ENCOUNTER — Telehealth: Payer: Self-pay | Admitting: Internal Medicine

## 2015-10-21 MED ORDER — DICLOFENAC SODIUM 1 % TD GEL: 2.0000 g | Freq: Four times a day (QID) | TRANSDERMAL | Status: DC | PRN

## 2015-10-21 NOTE — Telephone Encounter (Signed)
Fine to fill x1

## 2015-10-21 NOTE — Telephone Encounter (Signed)
Pt needs refills on the following Sent to [place], [place] diclofenac sodium (VOLTAREN) 1 % GEL  methotrexate (RHEUMATREX) 2.5 MG tablet  And also on her eye drops

## 2015-10-21 NOTE — Telephone Encounter (Signed)
A refill has been sent.  Mom will call back with the name of the eye drops needed to be refilled.

## 2015-10-21 NOTE — Telephone Encounter (Signed)
diclofenac sodium (VOLTAREN) 1 % GEL was last filled by orthopedics.  Okay to fill?

## 2015-10-23 DIAGNOSIS — M069 Rheumatoid arthritis, unspecified: Secondary | ICD-10-CM | POA: Diagnosis not present

## 2015-10-23 DIAGNOSIS — R262 Difficulty in walking, not elsewhere classified: Secondary | ICD-10-CM | POA: Diagnosis not present

## 2015-10-23 DIAGNOSIS — M6281 Muscle weakness (generalized): Secondary | ICD-10-CM | POA: Diagnosis not present

## 2015-10-30 DIAGNOSIS — M6281 Muscle weakness (generalized): Secondary | ICD-10-CM | POA: Diagnosis not present

## 2015-10-30 DIAGNOSIS — R262 Difficulty in walking, not elsewhere classified: Secondary | ICD-10-CM | POA: Diagnosis not present

## 2015-10-30 DIAGNOSIS — M069 Rheumatoid arthritis, unspecified: Secondary | ICD-10-CM | POA: Diagnosis not present

## 2015-11-06 ENCOUNTER — Telehealth: Payer: Self-pay | Admitting: *Deleted

## 2015-11-06 DIAGNOSIS — R262 Difficulty in walking, not elsewhere classified: Secondary | ICD-10-CM | POA: Diagnosis not present

## 2015-11-06 DIAGNOSIS — M6281 Muscle weakness (generalized): Secondary | ICD-10-CM | POA: Diagnosis not present

## 2015-11-06 DIAGNOSIS — M069 Rheumatoid arthritis, unspecified: Secondary | ICD-10-CM | POA: Diagnosis not present

## 2015-11-06 MED ORDER — PREDNISONE 2.5 MG PO TABS: 2.5000 mg | ORAL_TABLET | Freq: Every day | ORAL | Status: DC

## 2015-11-06 NOTE — Telephone Encounter (Signed)
Refill sent.

## 2015-11-06 NOTE — Telephone Encounter (Signed)
[facility] [place] [phone] or fax [phone] Refill request predniSONE (DELTASONE) 2.5 MG tablet Last filled 07/25/15 Last office visit 08/26/15 Okay to refill?

## 2015-11-06 NOTE — Telephone Encounter (Signed)
Fine to fill

## 2015-11-13 DIAGNOSIS — R262 Difficulty in walking, not elsewhere classified: Secondary | ICD-10-CM | POA: Diagnosis not present

## 2015-11-13 DIAGNOSIS — M069 Rheumatoid arthritis, unspecified: Secondary | ICD-10-CM | POA: Diagnosis not present

## 2015-11-13 DIAGNOSIS — M6281 Muscle weakness (generalized): Secondary | ICD-10-CM | POA: Diagnosis not present

## 2015-11-20 DIAGNOSIS — R262 Difficulty in walking, not elsewhere classified: Secondary | ICD-10-CM | POA: Diagnosis not present

## 2015-11-20 DIAGNOSIS — M6281 Muscle weakness (generalized): Secondary | ICD-10-CM | POA: Diagnosis not present

## 2015-11-20 DIAGNOSIS — M069 Rheumatoid arthritis, unspecified: Secondary | ICD-10-CM | POA: Diagnosis not present

## 2015-11-24 ENCOUNTER — Ambulatory Visit: Payer: Medicare Other | Admitting: Internal Medicine

## 2015-11-26 ENCOUNTER — Telehealth: Payer: Self-pay | Admitting: Internal Medicine

## 2015-11-26 ENCOUNTER — Ambulatory Visit: Payer: Medicare Other | Admitting: Internal Medicine

## 2015-11-26 DIAGNOSIS — M069 Rheumatoid arthritis, unspecified: Secondary | ICD-10-CM | POA: Diagnosis not present

## 2015-11-26 DIAGNOSIS — R262 Difficulty in walking, not elsewhere classified: Secondary | ICD-10-CM | POA: Diagnosis not present

## 2015-11-26 DIAGNOSIS — M6281 Muscle weakness (generalized): Secondary | ICD-10-CM | POA: Diagnosis not present

## 2015-11-26 NOTE — Telephone Encounter (Signed)
Fine to give orders for this

## 2015-11-26 NOTE — Telephone Encounter (Signed)
[name] [phone] called from pt group home regarding pt needing a rollator walker push and sit. Pt Cindy Oconnor is requesting it and seeing pt once a week for about six weeks. Need a written order fax to [phone]. Put [name] name on it. Thank You!

## 2015-11-26 NOTE — Telephone Encounter (Signed)
Please advise, thanks.

## 2015-11-27 NOTE — Telephone Encounter (Signed)
Faxed letter to [employer]

## 2015-11-28 ENCOUNTER — Ambulatory Visit: Payer: Medicare Other | Admitting: Sports Medicine

## 2015-12-03 DIAGNOSIS — M7061 Trochanteric bursitis, right hip: Secondary | ICD-10-CM | POA: Diagnosis not present

## 2015-12-08 ENCOUNTER — Other Ambulatory Visit: Payer: Self-pay

## 2015-12-08 MED ORDER — SENNOSIDES 8.6 MG PO TABS: 1.0000 | ORAL_TABLET | Freq: Every day | ORAL | Status: DC

## 2015-12-17 ENCOUNTER — Telehealth: Payer: Self-pay | Admitting: Internal Medicine

## 2015-12-17 NOTE — Telephone Encounter (Signed)
[name] [phone] called from pt group home stating a Mobility assessment from medicare was faxed on 07/06,07/13 and 07/18 from [facility]. Please advise? Thank you!

## 2015-12-17 NOTE — Telephone Encounter (Signed)
Left message on [facility] phone stating to return call back

## 2015-12-17 NOTE — Telephone Encounter (Signed)
Spoken to Cindy Oconnor she stated she will be having patient PT tech fill the assessment out next time he comes in. If he is unable to fill out paperwork she will schedule appointment for the mobility assessment.  Patients mother misinformed [facility] on what was needed and retracted her statement to not have assessment done.   Will be awaiting call back for appointment to be scheduled or stating that the PT tech can handle the assessment.

## 2015-12-17 NOTE — Telephone Encounter (Signed)
Cindy Oconnor can you call her to explain, thanks

## 2015-12-31 DIAGNOSIS — M25461 Effusion, right knee: Secondary | ICD-10-CM | POA: Diagnosis not present

## 2015-12-31 DIAGNOSIS — M21161 Varus deformity, not elsewhere classified, right knee: Secondary | ICD-10-CM | POA: Diagnosis not present

## 2015-12-31 DIAGNOSIS — M1711 Unilateral primary osteoarthritis, right knee: Secondary | ICD-10-CM | POA: Diagnosis not present

## 2015-12-31 DIAGNOSIS — M170 Bilateral primary osteoarthritis of knee: Secondary | ICD-10-CM | POA: Diagnosis not present

## 2016-01-02 ENCOUNTER — Ambulatory Visit (INDEPENDENT_AMBULATORY_CARE_PROVIDER_SITE_OTHER): Payer: Medicare Other | Admitting: Sports Medicine

## 2016-01-02 ENCOUNTER — Encounter: Payer: Self-pay | Admitting: Sports Medicine

## 2016-01-02 DIAGNOSIS — M79673 Pain in unspecified foot: Secondary | ICD-10-CM | POA: Diagnosis not present

## 2016-01-02 DIAGNOSIS — B353 Tinea pedis: Secondary | ICD-10-CM | POA: Diagnosis not present

## 2016-01-02 DIAGNOSIS — L84 Corns and callosities: Secondary | ICD-10-CM | POA: Diagnosis not present

## 2016-01-02 DIAGNOSIS — M21619 Bunion of unspecified foot: Secondary | ICD-10-CM

## 2016-01-02 DIAGNOSIS — B351 Tinea unguium: Secondary | ICD-10-CM

## 2016-01-02 NOTE — Patient Instructions (Signed)
Pads to bunion during the day when she is wearing shoes  Lotion or cream to bottom of both feet for dry skin

## 2016-01-02 NOTE — Progress Notes (Signed)
Patient ID: Cindy Oconnor, female   DOB: September 21, 1970, 45 y.o.   MRN: [id number]  Subjective: Cindy Oconnor is a 45 y.o. female patient seen today in office with complaint of painful thickened and elongated toenails; unable to trim and callus skin. Reports that she has been using cream some days  to bottom of feet with improvement. States that her bunions hurt in her shoes. Admits she had a shot in her right knee and is using a walker because of arthritis. Patient has no other pedal complaints at this time.   Patient is assisted by facility caregiver.   Patient Active Problem List   Diagnosis Date Noted  . Airway hyperreactivity 09/26/2015  . Deep vein thrombosis (DVT) ([place]) 09/26/2015  . Herpes zona 09/26/2015  . Chronic ulcerative colitis ([place]) 09/26/2015  . Dysphagia 08/26/2015  . Triggering of digit 07/09/2014  . Colitis 01/30/2014  . Inflammatory arthritis ([place]) 01/30/2014  . Osteoarthritis 01/30/2014  . Osteoporosis 01/30/2014  . Abnormal ECG 02/13/2013  . Mild tricuspid regurgitation 02/13/2013  . Routine general medical examination at a health care facility 01/24/2013  . Nonspecific abnormal electrocardiogram (ECG) (EKG) 01/24/2013  . Obesity (BMI 30-39.9) 01/21/2012  . Recurrent boils 03/12/2009  . ULCERATIVE COLITIS 06/11/2006  . Rheumatoid arthritis ([place]) 06/11/2006  . OSTEOPENIA 06/11/2006  . HYSTERECTOMY, HX OF 06/11/2006  . Hypothyroid 03/18/2006  . Asthma, chronic 03/18/2006  . GERD 03/18/2006  . Down's syndrome 03/18/2006   Current Outpatient Prescriptions on File Prior to Visit  Medication Sig Dispense Refill  . alendronate (FOSAMAX) 70 MG tablet Take 1 tablet (70 mg total) by mouth once a week. Take with a full glass of water on an empty stomach. 4 tablet 3  . benzonatate (TESSALON) 100 MG capsule Take 100 mg by mouth 3 (three) times daily as needed for cough.    . budesonide-formoterol (SYMBICORT) 160-4.5 MCG/ACT inhaler Inhale 2 puffs into the lungs 2  (two) times daily. 1 Inhaler 3  . calcium carbonate (OSCAL) 1500 (600 Ca) MG TABS tablet Take 1 tablet (1,500 mg total) by mouth daily. 30 tablet 4  . carbamide peroxide (DEBROX) 6.5 % otic solution Place 5 drops into both ears as needed (to remove earwax).    . cholecalciferol (VITAMIN D) 1000 units tablet Take 1 tablet (1,000 Units total) by mouth daily. 30 tablet 3  . diclofenac sodium (VOLTAREN) 1 % GEL Apply 2-4 g topically 4 (four) times daily as needed (for pain). 100 g 0  . docusate sodium (COLACE) 100 MG capsule Take 1 capsule (100 mg total) by mouth every other day. 10 capsule 3  . folic acid (FOLVITE) 1 MG tablet Take 1 tablet (1 mg total) by mouth daily. 30 tablet 5  . gentamicin ointment (GARAMYCIN) 0.1 % Apply 1 application topically 3 (three) times daily as needed (for rash).     Marland Kitchen guaifenesin (HUMIBID E) 400 MG TABS tablet Take 400 mg by mouth every 4 (four) hours as needed (for congestion).    Marland Kitchen guaiFENesin (ROBITUSSIN) 100 MG/5ML SOLN Take 5 mLs (100 mg total) by mouth every 4 (four) hours as needed for cough or to loosen phlegm. 1200 mL 0  . ketoconazole (NIZORAL) 2 % cream Apply 1 application topically daily. 60 g 2  . lactulose (CHRONULAC) 10 GM/15ML solution Take 30 g by mouth daily as needed for mild constipation.    Marland Kitchen levalbuterol (XOPENEX) 0.63 MG/3ML nebulizer solution Take 3 mLs (0.63 mg total) by nebulization 3 (three) times daily as  needed for wheezing or shortness of breath. 3 mL 6  . levothyroxine (SYNTHROID, LEVOTHROID) 125 MCG tablet Take 1 tablet (125 mcg total) by mouth daily before breakfast. 30 tablet 3  . methotrexate (RHEUMATREX) 2.5 MG tablet Take 7 tablets (17.5 mg total) by mouth once a week. Caution:Chemotherapy. Protect from light. 28 tablet 3  . Multiple Vitamin (MULTIVITAMIN WITH MINERALS) TABS tablet Take 1 tablet by mouth daily.    . Multiple Vitamin (THEREMS) TABS     . Multiple Vitamins-Minerals (ONE-A-DAY VITACRAVES) CHEW Two chewables daily 70  tablet 6  . omeprazole (PRILOSEC) 20 MG capsule Take 1 capsule (20 mg total) by mouth 2 (two) times daily. 60 capsule 3  . ondansetron (ZOFRAN) 4 MG tablet Take 4 mg by mouth every 8 (eight) hours as needed for nausea or vomiting.     . predniSONE (DELTASONE) 2.5 MG tablet Take 1 tablet (2.5 mg total) by mouth daily. Please start this again after steroid taper thanks 30 tablet 2  . senna (SENEXON) 8.6 MG tablet Take 1 tablet (8.6 mg total) by mouth at bedtime. 30 tablet 3  . Tiotropium Bromide Monohydrate (SPIRIVA RESPIMAT) 1.25 MCG/ACT AERS Inhale 2.5 mcg into the lungs daily. Use two puffs daily 4 g 5   No current facility-administered medications on file prior to visit.    Allergies  Allergen Reactions  . Erythromycin Other (See Comments), Rash and Shortness Of Breath    Reaction:  Unknown  Reaction:  Unknown   . Albuterol Other (See Comments) and Rash    Reaction:Makes pt jittery  "Jittery" Reaction:  Makes pt jittery     Objective: Physical Exam  General: Well developed, nourished, no acute distress, awake, alert and oriented x 3  Vascular: Dorsalis pedis artery 1/4 bilateral, Posterior tibial artery 2/4 bilateral, skin temperature warm to warm proximal to distal bilateral lower extremities, no varicosities, pedal hair present bilateral.  Neurological: Gross sensation present via light touch bilateral.   Dermatological: Skin is warm, dry, and supple bilateral, Nails 1-10 are tender, long, thick, and discolored with mild subungal debris, no webspace macerations present bilateral, no open lesions present bilateral, resolved plantar scaling, + hyperkeratotic tissue present bilateral to right heel, right 1st mtpj and left 2nd toe with no signs of infection bilateral.  Musculoskeletal: Bunion and Asymptomatic hammertoes with mild subluxation deformities noted bilateral. Muscular strength within normal limits without pain or limitation on range of motion. No pain with calf compression  bilateral.  Assessment and Plan:  Problem List Items Addressed This Visit    None    Visit Diagnoses    Dermatophytosis of nail    -  Primary   Corns and callosities       Tinea pedis of both feet       Foot pain, unspecified laterality       Bunion         -Examined patient.  -Discussed treatment options for painful mycotic nails and callus skin. -Mechanically debrided and reduced mycotic nails with sterile nail nipper and dremel nail file without incident. -Debrided callus x 3 using sterile chisel blade without incident.  -Recommend continue with Ketoconazole daily until completed to prevent recurrence  -Recommend daily skin emollients for dryness  -Gave toe foam pads for bunion areas to offer protection while in shoes -Continue with good supportive shoes daily -Patient to return in 3 months for follow up evaluation or sooner if symptoms worsen.  Landis Martins, DPM

## 2016-01-07 ENCOUNTER — Other Ambulatory Visit: Payer: Self-pay

## 2016-01-07 MED ORDER — METHOTREXATE 2.5 MG PO TABS: 17.5000 mg | ORAL_TABLET | ORAL | 0 refills | Status: DC

## 2016-01-07 MED ORDER — OMEPRAZOLE 20 MG PO CPDR: 20.0000 mg | DELAYED_RELEASE_CAPSULE | Freq: Two times a day (BID) | ORAL | 0 refills | Status: DC

## 2016-01-07 MED ORDER — LEVOTHYROXINE SODIUM 125 MCG PO TABS: 125.0000 ug | ORAL_TABLET | Freq: Every day | ORAL | 0 refills | Status: DC

## 2016-01-07 MED ORDER — VITAMIN D 1000 UNITS PO TABS: 1000.0000 [IU] | ORAL_TABLET | Freq: Every day | ORAL | 0 refills | Status: DC

## 2016-01-07 MED ORDER — DOCUSATE SODIUM 100 MG PO CAPS: 100.0000 mg | ORAL_CAPSULE | ORAL | 0 refills | Status: DC

## 2016-01-13 ENCOUNTER — Other Ambulatory Visit: Payer: Self-pay

## 2016-01-13 MED ORDER — VITAMIN D 1000 UNITS PO TABS: 1000.0000 [IU] | ORAL_TABLET | Freq: Every day | ORAL | 0 refills | Status: DC

## 2016-01-21 DIAGNOSIS — M6281 Muscle weakness (generalized): Secondary | ICD-10-CM | POA: Diagnosis not present

## 2016-01-21 DIAGNOSIS — M069 Rheumatoid arthritis, unspecified: Secondary | ICD-10-CM | POA: Diagnosis not present

## 2016-01-21 DIAGNOSIS — R262 Difficulty in walking, not elsewhere classified: Secondary | ICD-10-CM | POA: Diagnosis not present

## 2016-01-26 ENCOUNTER — Telehealth: Payer: Self-pay | Admitting: Internal Medicine

## 2016-01-26 NOTE — Telephone Encounter (Signed)
Ok to schedule new patient appt with Dr. Derrel Nip, as soon as available for new patient. Was a Environmental consultant patient. thanks

## 2016-01-26 NOTE — Telephone Encounter (Signed)
Ms. Litts mother, [name], called saying she met Dr. Derrel Nip in a grocery store and was told by Dr. Derrel Nip that she'd take on Ms. Ketter, her brother, and [name] as new patients. Please give [name] a phone call to set up an appointment.   Kay's ph# [phone] Thank you.

## 2016-01-26 NOTE — Telephone Encounter (Signed)
See prior messages regarding Patient mom and brother, and advise, thanks

## 2016-01-26 NOTE — Telephone Encounter (Signed)
For the third time YES.   I have responded twice to this patient's messages about taking on her AND FAMILY

## 2016-01-26 NOTE — Telephone Encounter (Signed)
Ok Pt is scheduled.

## 2016-01-28 DIAGNOSIS — R262 Difficulty in walking, not elsewhere classified: Secondary | ICD-10-CM | POA: Diagnosis not present

## 2016-01-28 DIAGNOSIS — M6281 Muscle weakness (generalized): Secondary | ICD-10-CM | POA: Diagnosis not present

## 2016-01-28 DIAGNOSIS — M069 Rheumatoid arthritis, unspecified: Secondary | ICD-10-CM | POA: Diagnosis not present

## 2016-02-05 DIAGNOSIS — M6281 Muscle weakness (generalized): Secondary | ICD-10-CM | POA: Diagnosis not present

## 2016-02-05 DIAGNOSIS — R262 Difficulty in walking, not elsewhere classified: Secondary | ICD-10-CM | POA: Diagnosis not present

## 2016-02-05 DIAGNOSIS — M069 Rheumatoid arthritis, unspecified: Secondary | ICD-10-CM | POA: Diagnosis not present

## 2016-02-12 DIAGNOSIS — M6281 Muscle weakness (generalized): Secondary | ICD-10-CM | POA: Diagnosis not present

## 2016-02-12 DIAGNOSIS — M069 Rheumatoid arthritis, unspecified: Secondary | ICD-10-CM | POA: Diagnosis not present

## 2016-02-12 DIAGNOSIS — R262 Difficulty in walking, not elsewhere classified: Secondary | ICD-10-CM | POA: Diagnosis not present

## 2016-02-19 DIAGNOSIS — M6281 Muscle weakness (generalized): Secondary | ICD-10-CM | POA: Diagnosis not present

## 2016-02-19 DIAGNOSIS — R262 Difficulty in walking, not elsewhere classified: Secondary | ICD-10-CM | POA: Diagnosis not present

## 2016-02-19 DIAGNOSIS — M069 Rheumatoid arthritis, unspecified: Secondary | ICD-10-CM | POA: Diagnosis not present

## 2016-02-20 ENCOUNTER — Ambulatory Visit (INDEPENDENT_AMBULATORY_CARE_PROVIDER_SITE_OTHER): Payer: Medicare Other | Admitting: Family Medicine

## 2016-02-20 ENCOUNTER — Encounter: Payer: Self-pay | Admitting: Family Medicine

## 2016-02-20 ENCOUNTER — Ambulatory Visit (INDEPENDENT_AMBULATORY_CARE_PROVIDER_SITE_OTHER)
Admission: RE | Admit: 2016-02-20 | Discharge: 2016-02-20 | Disposition: A | Discharge status: Home / Self Care | Payer: Medicare Other | Source: Ambulatory Visit | Attending: Family Medicine | Admitting: Family Medicine

## 2016-02-20 ENCOUNTER — Telehealth: Payer: Self-pay | Admitting: Internal Medicine

## 2016-02-20 VITALS — BP 106/64 | HR 70 | Temp 98.3°F | Wt 159.6 lb

## 2016-02-20 DIAGNOSIS — R05 Cough: Secondary | ICD-10-CM

## 2016-02-20 DIAGNOSIS — J45901 Unspecified asthma with (acute) exacerbation: Secondary | ICD-10-CM | POA: Insufficient documentation

## 2016-02-20 DIAGNOSIS — R059 Cough, unspecified: Secondary | ICD-10-CM

## 2016-02-20 MED ORDER — DOXYCYCLINE HYCLATE 100 MG PO TABS: 100.0000 mg | ORAL_TABLET | Freq: Two times a day (BID) | ORAL | 0 refills | Status: DC

## 2016-02-20 MED ORDER — PREDNISONE 20 MG PO TABS: 40.0000 mg | ORAL_TABLET | Freq: Every day | ORAL | 0 refills | Status: DC

## 2016-02-20 NOTE — Progress Notes (Signed)
Tommi Rumps, MD Phone: [phone]  Cindy Oconnor is a 45 y.o. female who presents today for same-day visit.  Patient presents with her mother who contributes to the history. Notes patient lives in a group home and patient's mother received a call yesterday stating that she had been coughing and having some congestion in her chest. She notes some mild upper respiratory congestion as well. They've been giving her nebulizer treatments with some benefit though she continues to cough. Last nebulizer treatment was this morning. Had some posttussive emesis this morning. No fevers. Has some mild difficulty breathing at times. Notes a sharp discomfort in her chest with cough though no chest pressure. Cough is productive of yellow mucus.  PMH: nonsmoker.   ROS see history of present illness  Objective  Physical Exam Vitals:   02/20/16 1306  BP: 106/64  Pulse: 70  Temp: 98.3 F (36.8 C)    BP Readings from Last 3 Encounters:  02/20/16 106/64  08/26/15 94/64  07/30/15 107/66   Wt Readings from Last 3 Encounters:  02/20/16 159 lb 9.6 oz (72.4 kg)  08/26/15 166 lb 4 oz (75.4 kg)  07/30/15 164 lb (74.4 kg)    Physical Exam  Constitutional: No distress.  HENT:  Head: Normocephalic and atraumatic.  Mouth/Throat: Oropharynx is clear and moist. No oropharyngeal exudate.  Eyes: Conjunctivae are normal. Pupils are equal, round, and reactive to light.  Cardiovascular: Normal rate, regular rhythm and normal heart sounds.   Pulmonary/Chest: Effort normal. No respiratory distress. She has no rales.  Coarse breath sounds throughout, mild expiratory wheezes  Musculoskeletal: She exhibits no edema.  Neurological: She is alert.  Skin: Skin is warm and dry. She is not diaphoretic.     Assessment/Plan: Please see individual problem list.  Asthma with acute exacerbation Patient's symptoms most consistent with asthma exacerbation. Likely brought on by viral illness. Could be  underlying bronchitis. Doubt pneumonia given lack of focal lung findings. We will obtain a chest x-ray to evaluate further. We will treat with prednisone. She can use Xopenex every 6 hours for the next 2 days and then as needed. She was provided with the doxycycline prescription to fill if not improving with one day of prednisone to cover for potential infectious cause. She is given return precautions.   Orders Placed This Encounter  Procedures  . DG Chest 2 View    Standing Status:   Future    Standing Expiration Date:   04/21/2017    Order Specific Question:   Reason for Exam (SYMPTOM  OR DIAGNOSIS REQUIRED)    Answer:   cough, chest congestion, wheezing    Order Specific Question:   Is patient pregnant?    Answer:   No    Order Specific Question:   Preferred imaging location?    Answer:   [facility] ordered this encounter  Medications  . benzonatate (TESSALON) 100 MG capsule    Sig: Take 100 mg by mouth.  . predniSONE (DELTASONE) 20 MG tablet    Sig: Take 2 tablets (40 mg total) by mouth daily with breakfast.    Dispense:  10 tablet    Refill:  0  . DISCONTD: doxycycline (VIBRA-TABS) 100 MG tablet    Sig: Take 1 tablet (100 mg total) by mouth 2 (two) times daily.    Dispense:  14 tablet    Refill:  0  . doxycycline (VIBRA-TABS) 100 MG tablet    Sig: Take 1 tablet (100 mg total)  by mouth 2 (two) times daily.    Dispense:  14 tablet    Refill:  0    Tommi Rumps, MD [place]

## 2016-02-20 NOTE — Progress Notes (Signed)
Pre visit review using our clinic review tool, if applicable. No additional management support is needed unless otherwise documented below in the visit note. 

## 2016-02-20 NOTE — Telephone Encounter (Signed)
Please advise. Patient was seen at 1300

## 2016-02-20 NOTE — Patient Instructions (Signed)
Nice to meet you. It appears that you're having an asthma exacerbation. We will treat you with prednisone for this. You should use your Xopenex nebulizer solution every 6 hours for the next 2 days and then as needed. If you are not improving with the prednisone over the next day please start on the antibiotic. If you develop worsening trouble breathing, chest pain, fevers, cough productive of blood, or any new or changing symptoms please seek medical attention.

## 2016-02-20 NOTE — Assessment & Plan Note (Signed)
Patient's symptoms most consistent with asthma exacerbation. Likely brought on by viral illness. Could be underlying bronchitis. Doubt pneumonia given lack of focal lung findings. We will obtain a chest x-ray to evaluate further. We will treat with prednisone. She can use Xopenex every 6 hours for the next 2 days and then as needed. She was provided with the doxycycline prescription to fill if not improving with one day of prednisone to cover for potential infectious cause. She is given return precautions.

## 2016-02-20 NOTE — Telephone Encounter (Signed)
RX sent to pharmacy  

## 2016-02-20 NOTE — Telephone Encounter (Signed)
Pt mom called and asked if medication doxycycline (VIBRA-TABS) 100 MG tablet could be sent over electronically. They are currently at [facility] getting a chest xray and don't think that they can make it over to drop it off before they close.  Pharmacy [place], [place]  Call pt mom @  [phone] (M)  Thank you!

## 2016-02-23 ENCOUNTER — Ambulatory Visit (INDEPENDENT_AMBULATORY_CARE_PROVIDER_SITE_OTHER): Payer: Medicare Other | Admitting: Family Medicine

## 2016-02-23 DIAGNOSIS — J45901 Unspecified asthma with (acute) exacerbation: Secondary | ICD-10-CM | POA: Diagnosis not present

## 2016-02-23 MED ORDER — PREDNISONE 20 MG PO TABS: ORAL_TABLET | ORAL | 0 refills | Status: DC

## 2016-02-23 NOTE — Assessment & Plan Note (Addendum)
Patient continues to have symptoms. Suspect asthma exacerbation with possible underlying bronchitis. Vital signs are stable. Discussed that we need her to get a little bit higher dose of steroids and will try to get her 30 mg and taper off of this. She'll continue the doxycycline. She'll continue Xopenex. She will additionally hold her methotrexate given potential for infection. She'll follow-up with me in 3 days for recheck. Given return precautions.

## 2016-02-23 NOTE — Progress Notes (Signed)
Pre visit review using our clinic review tool, if applicable. No additional management support is needed unless otherwise documented below in the visit note. 

## 2016-02-23 NOTE — Patient Instructions (Signed)
Nice to see you. Your symptoms are likely related to a viral or bacterial bronchitis. We will have you finish your doxycycline. We will change your prednisone to a taper. You can continue the Xopenex. You can continue the over the counter cough suppressant. I will see you back on Thursday for recheck. If you develop persistent chest pain, shortness of breath, cough productive of blood, fevers, or any new or changing symptoms please seek medical attention.

## 2016-02-23 NOTE — Progress Notes (Signed)
  Tommi Rumps, MD Phone: [phone]  Cindy Oconnor is a 45 y.o. female who presents today for follow-up.  Patient was seen late last week and diagnosed with asthma exacerbation and possible bronchitis. Chest x-ray was unremarkable at that time. Started on prednisone and doxycycline. Patient's mother reports that she was unable to tolerate the 40 mg of prednisone and she has been receiving 20 mg of prednisone daily. She became jittery and emotional on 40 mg. Notes her cough and chest sounds looser to them. Coughing up some yellow mucus. No shortness of breath. She is still wheezing. She does note soreness in her chest with coughing though no other chest pain. Cough is worse at night. Over-the-counter tussin helps with her cough. She's been using her Xopenex as well. No fevers.  ROS see history of present illness  Objective  Physical Exam Vitals:   02/23/16 1312  BP: 124/80  Pulse: 64  Temp: 98.3 F (36.8 C)    BP Readings from Last 3 Encounters:  02/23/16 124/80  02/20/16 106/64  08/26/15 94/64   Wt Readings from Last 3 Encounters:  02/23/16 159 lb (72.1 kg)  02/20/16 159 lb 9.6 oz (72.4 kg)  08/26/15 166 lb 4 oz (75.4 kg)    Physical Exam  Constitutional: No distress.  HENT:  Head: Normocephalic and atraumatic.  Mouth/Throat: Oropharynx is clear and moist. No oropharyngeal exudate.  Normal TMs bilaterally  Eyes: Conjunctivae are normal. Pupils are equal, round, and reactive to light.  Cardiovascular: Normal rate, regular rhythm and normal heart sounds.   Pulmonary/Chest: Effort normal. No respiratory distress. She has wheezes (moderate expiratory wheezes). She has no rales. She exhibits tenderness (right costochondral joints tender to palpation).  Mild coarse breath sounds throughout  Abdominal: Soft. Bowel sounds are normal. She exhibits no distension. There is no tenderness.  Musculoskeletal: She exhibits no edema.  Bilateral calves with no swelling or  tenderness  Neurological: She is alert. Gait normal.  Skin: Skin is warm and dry. She is not diaphoretic.     Assessment/Plan: Please see individual problem list.  Asthma with acute exacerbation Patient continues to have symptoms. Suspect asthma exacerbation with possible underlying bronchitis. Vital signs are stable. Discussed that we need her to get a little bit higher dose of steroids and will try to get her 30 mg and taper off of this. She'll continue the doxycycline. She'll continue Xopenex. She will additionally hold her methotrexate given potential for infection. She'll follow-up with me in 3 days for recheck. Given return precautions.   No orders of the defined types were placed in this encounter.   Meds ordered this encounter  Medications  . predniSONE (DELTASONE) 20 MG tablet    Sig: Please take 30 mg (1.5 tablets) by mouth daily for 4 days, then take 20 mg (one tablet) by mouth daily for 4 days, then take 10 mg (0.5 tablets) by mouth daily for 4 days    Dispense:  12 tablet    Refill:  0    Tommi Rumps, MD [place]

## 2016-02-26 ENCOUNTER — Encounter: Payer: Self-pay | Admitting: Family Medicine

## 2016-02-26 ENCOUNTER — Ambulatory Visit (INDEPENDENT_AMBULATORY_CARE_PROVIDER_SITE_OTHER): Payer: Medicare Other | Admitting: Family Medicine

## 2016-02-26 DIAGNOSIS — J45901 Unspecified asthma with (acute) exacerbation: Secondary | ICD-10-CM

## 2016-02-26 MED ORDER — GUAIFENESIN 100 MG/5ML PO SOLN
5.0000 mL | ORAL | 0 refills | Status: DC | PRN
Start: 2016-02-26 — End: 2016-09-14

## 2016-02-26 NOTE — Progress Notes (Signed)
Pre visit review using our clinic review tool, if applicable. No additional management support is needed unless otherwise documented below in the visit note. 

## 2016-02-26 NOTE — Progress Notes (Signed)
  Tommi Rumps, MD Phone: [phone]  Cindy Oconnor is a 45 y.o. female who presents today for follow-up.  Patient has been seen on 2 occasions recently for asthma exacerbation and likely bronchitis. She notes she feels improved. Still some congestion in her chest. No shortness of breath. Still wheezing somewhat. Has been able to tolerate the 30 mg of prednisone. Just about done with her doxycycline. Needs Robitussin sent to the pharmacy. No fevers. Overall improving.  ROS see history of present illness  Objective  Physical Exam Vitals:   02/26/16 1309  BP: 108/72  Pulse: 98  Temp: 98.3 F (36.8 C)    BP Readings from Last 3 Encounters:  02/26/16 108/72  02/23/16 124/80  02/20/16 106/64   Wt Readings from Last 3 Encounters:  02/26/16 164 lb 3.2 oz (74.5 kg)  02/23/16 159 lb (72.1 kg)  02/20/16 159 lb 9.6 oz (72.4 kg)    Physical Exam  Constitutional: No distress.  Cardiovascular: Normal rate, regular rhythm and normal heart sounds.   Pulmonary/Chest: Effort normal. No respiratory distress. She has wheezes (mild expiratory wheezes). She has no rales.  Breath sounds significantly less coarse than previously  Neurological: She is alert. Gait normal.  Skin: Skin is warm and dry. She is not diaphoretic.     Assessment/Plan: Please see individual problem list.  Asthma with acute exacerbation Overall quite a bit improved. Vital signs are stable. Lung exam much improved from previously. Patient feels improved. She will continue the steroid taper. She'll finish her antibiotics. She'll continue her Xopenex. She'll follow-up next week as scheduled with her PCP. They're given return precautions.   No orders of the defined types were placed in this encounter.   Meds ordered this encounter  Medications  . guaiFENesin (ROBITUSSIN) 100 MG/5ML SOLN    Sig: Take 5 mLs (100 mg total) by mouth every 4 (four) hours as needed for cough or to loosen phlegm.    Dispense:  473  mL    Refill:  0    Tommi Rumps, MD [place]

## 2016-02-26 NOTE — Patient Instructions (Addendum)
Nice to see you. I'm glad you're improving. You should continue the prednisone. Finish your antibiotics. I sent Robitussin to your pharmacy. If you develops shortness of breath, cough productive of blood, or fevers please seek medical attention immediately.

## 2016-02-26 NOTE — Assessment & Plan Note (Signed)
Overall quite a bit improved. Vital signs are stable. Lung exam much improved from previously. Patient feels improved. She will continue the steroid taper. She'll finish her antibiotics. She'll continue her Xopenex. She'll follow-up next week as scheduled with her PCP. They're given return precautions.

## 2016-03-03 ENCOUNTER — Ambulatory Visit (INDEPENDENT_AMBULATORY_CARE_PROVIDER_SITE_OTHER): Payer: Medicare Other | Admitting: Internal Medicine

## 2016-03-03 ENCOUNTER — Encounter: Payer: Self-pay | Admitting: Internal Medicine

## 2016-03-03 VITALS — BP 122/88 | HR 77 | Temp 97.9°F | Resp 12 | Ht <= 58 in | Wt 161.0 lb

## 2016-03-03 DIAGNOSIS — E559 Vitamin D deficiency, unspecified: Secondary | ICD-10-CM | POA: Diagnosis not present

## 2016-03-03 DIAGNOSIS — Z79899 Other long term (current) drug therapy: Secondary | ICD-10-CM

## 2016-03-03 DIAGNOSIS — Q909 Down syndrome, unspecified: Secondary | ICD-10-CM

## 2016-03-03 DIAGNOSIS — J4541 Moderate persistent asthma with (acute) exacerbation: Secondary | ICD-10-CM

## 2016-03-03 DIAGNOSIS — E039 Hypothyroidism, unspecified: Secondary | ICD-10-CM

## 2016-03-03 MED ORDER — ONE-A-DAY VITACRAVES PO CHEW: CHEWABLE_TABLET | ORAL | 6 refills | Status: DC

## 2016-03-03 MED ORDER — LEVALBUTEROL HCL 0.63 MG/3ML IN NEBU: 0.6300 mg | INHALATION_SOLUTION | Freq: Three times a day (TID) | RESPIRATORY_TRACT | 6 refills | Status: DC | PRN

## 2016-03-03 MED ORDER — OMEPRAZOLE 20 MG PO CPDR: 20.0000 mg | DELAYED_RELEASE_CAPSULE | Freq: Two times a day (BID) | ORAL | 5 refills | Status: DC

## 2016-03-03 MED ORDER — METHOTREXATE 2.5 MG PO TABS: 17.5000 mg | ORAL_TABLET | ORAL | 5 refills | Status: DC

## 2016-03-03 MED ORDER — BUDESONIDE-FORMOTEROL FUMARATE 160-4.5 MCG/ACT IN AERO
2.0000 | INHALATION_SPRAY | Freq: Two times a day (BID) | RESPIRATORY_TRACT | 3 refills | Status: DC
Start: 2016-03-03 — End: 2017-03-14

## 2016-03-03 MED ORDER — LEVOTHYROXINE SODIUM 125 MCG PO TABS: 125.0000 ug | ORAL_TABLET | Freq: Every day | ORAL | 5 refills | Status: DC

## 2016-03-03 MED ORDER — MONTELUKAST SODIUM 10 MG PO TABS: 10.0000 mg | ORAL_TABLET | Freq: Every day | ORAL | 3 refills | Status: DC

## 2016-03-03 MED ORDER — VITAMIN D 1000 UNITS PO TABS: 1000.0000 [IU] | ORAL_TABLET | Freq: Every day | ORAL | 5 refills | Status: DC

## 2016-03-03 MED ORDER — LORATADINE 10 MG PO TABS: 10.0000 mg | ORAL_TABLET | Freq: Every day | ORAL | 11 refills | Status: DC

## 2016-03-03 MED ORDER — KETOCONAZOLE 2 % EX CREA
1.0000 | TOPICAL_CREAM | Freq: Every day | CUTANEOUS | 2 refills | Status: DC
Start: 2016-03-03 — End: 2017-01-19

## 2016-03-03 MED ORDER — ALENDRONATE SODIUM 70 MG PO TABS: 70.0000 mg | ORAL_TABLET | ORAL | 3 refills | Status: DC

## 2016-03-03 MED ORDER — CALCIUM CARBONATE 1500 (600 CA) MG PO TABS: 600.0000 mg | ORAL_TABLET | Freq: Every day | ORAL | 4 refills | Status: DC

## 2016-03-03 NOTE — Progress Notes (Signed)
Pre-visit discussion using our clinic review tool. No additional management support is needed unless otherwise documented below in the visit note.  

## 2016-03-03 NOTE — Progress Notes (Signed)
Subjective:  Patient ID: Cindy Oconnor, female    DOB: 03-03-1971  Age: 45 y.o. MRN: [id number]  CC: The primary encounter diagnosis was Acquired hypothyroidism. Diagnoses of Vitamin D deficiency, Long-term use of high-risk medication, Moderate persistent chronic asthma with acute exacerbation, and Down's syndrome were also pertinent to this visit.  HPI Cindy Oconnor presents for transition of care.  She is a delightful middle aged woman with [place] who lives in group home [place].  She is accompanied by her mother today.   She was teated for bronchitis last week with prednisone , dose was reduced due to onset of tremulousness on the 40 mg dose,  Takes 2.5 mg daily for management of arthritis.  Needs 30 day refills on all meds  [facility]   Chronic right knee pain : Had stem cell injection right knee by Dr Tessa Lerner at [facility] in [place] .  First time.  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 Not a  Candidate for TKR   Seasonal allergies acting up.  Has had Several hospitalizations per year for asthma and ulcerative colitis.  Previously managed by Dorise Bullion , has not seen him in over a year.  Hypothyroid: dose was increased 7 months ago,  No repeat TSH done   hysterectomy at age 3  .    Outpatient Medications Prior to Visit  Medication Sig Dispense Refill  . benzonatate (TESSALON) 100 MG capsule Take 100 mg by mouth.    . carbamide peroxide (DEBROX) 6.5 % otic solution Place 5 drops into both ears as needed (to remove earwax).    . diclofenac sodium (VOLTAREN) 1 % GEL Apply 2-4 g topically 4 (four) times daily as needed (for pain). 100 g 0  . docusate sodium (COLACE) 100 MG capsule Take 1 capsule (100 mg total) by mouth every other day. 10 capsule 0  . doxycycline (VIBRA-TABS) 100 MG tablet Take 1 tablet (100 mg total) by mouth 2 (two) times daily. 14 tablet 0  . gentamicin ointment (GARAMYCIN) 0.1 %  Apply 1 application topically 3 (three) times daily as needed (for rash).     Marland Kitchen guaiFENesin (ROBITUSSIN) 100 MG/5ML SOLN Take 5 mLs (100 mg total) by mouth every 4 (four) hours as needed for cough or to loosen phlegm. 473 mL 0  . lactulose (CHRONULAC) 10 GM/15ML solution Take 30 g by mouth daily as needed for mild constipation.    . Multiple Vitamin (MULTIVITAMIN WITH MINERALS) TABS tablet Take 1 tablet by mouth daily.    . Multiple Vitamin (THEREMS) TABS     . ondansetron (ZOFRAN) 4 MG tablet Take 4 mg by mouth every 8 (eight) hours as needed for nausea or vomiting.     . predniSONE (DELTASONE) 20 MG tablet Please take 30 mg (1.5 tablets) by mouth daily for 4 days, then take 20 mg (one tablet) by mouth daily for 4 days, then take 10 mg (0.5 tablets) by mouth daily for 4 days 12 tablet 0  . senna (SENEXON) 8.6 MG tablet Take 1 tablet (8.6 mg total) by mouth at bedtime. 30 tablet 3  . Tiotropium Bromide Monohydrate (SPIRIVA RESPIMAT) 1.25 MCG/ACT AERS Inhale 2.5 mcg into the lungs daily. Use two puffs daily 4 g 5  . alendronate (FOSAMAX) 70 MG tablet Take 1 tablet (70 mg total) by mouth once a week. Take with a full glass of water on an empty stomach. 4 tablet  3  . budesonide-formoterol (SYMBICORT) 160-4.5 MCG/ACT inhaler Inhale 2 puffs into the lungs 2 (two) times daily. 1 Inhaler 3  . calcium carbonate (OSCAL) 1500 (600 Ca) MG TABS tablet Take 1 tablet (1,500 mg total) by mouth daily. 30 tablet 4  . cholecalciferol (VITAMIN D) 1000 units tablet Take 1 tablet (1,000 Units total) by mouth daily. 30 tablet 0  . levalbuterol (XOPENEX) 0.63 MG/3ML nebulizer solution Take 3 mLs (0.63 mg total) by nebulization 3 (three) times daily as needed for wheezing or shortness of breath. 3 mL 6  . levothyroxine (SYNTHROID, LEVOTHROID) 125 MCG tablet Take 1 tablet (125 mcg total) by mouth daily before breakfast. 30 tablet 0  . methotrexate (RHEUMATREX) 2.5 MG tablet Take 7 tablets (17.5 mg total) by mouth once a week.  Caution:Chemotherapy. Protect from light. 28 tablet 0  . Multiple Vitamins-Minerals (ONE-A-DAY VITACRAVES) CHEW Two chewables daily 70 tablet 6  . omeprazole (PRILOSEC) 20 MG capsule Take 1 capsule (20 mg total) by mouth 2 (two) times daily. 60 capsule 0  . folic acid (FOLVITE) 1 MG tablet Take 1 tablet (1 mg total) by mouth daily. 30 tablet 5  . ketoconazole (NIZORAL) 2 % cream Apply 1 application topically daily. 60 g 2   No facility-administered medications prior to visit.     Review of Systems;  Patient denies headache, fevers, malaise, unintentional weight loss, skin rash, eye pain, sinus congestion and sinus pain, sore throat, dysphagia,  hemoptysis , cough, dyspnea, wheezing, chest pain, palpitations, orthopnea, edema, abdominal pain, nausea, melena, diarrhea, constipation, flank pain, dysuria, hematuria, urinary  Frequency, nocturia, numbness, tingling, seizures,  Focal weakness, Loss of consciousness,  Tremor, insomnia, depression, anxiety, and suicidal ideation.      Objective:  BP 122/88   Pulse 77   Temp 97.9 F (36.6 C) (Oral)   Resp 12   Ht 4' 6"  (1.372 m)   Wt 161 lb (73 kg)   LMP 04/11/1981   SpO2 97%   BMI 38.82 kg/m   BP Readings from Last 3 Encounters:  03/03/16 122/88  02/26/16 108/72  02/23/16 124/80    Wt Readings from Last 3 Encounters:  03/03/16 161 lb (73 kg)  02/26/16 164 lb 3.2 oz (74.5 kg)  02/23/16 159 lb (72.1 kg)    General appearance: alert, cooperative and appears stated age Ears: normal TM's and external ear canals both ears Throat: lips, mucosa, and tongue normal; teeth and gums normal Neck: no adenopathy, no carotid bruit, supple, symmetrical, trachea midline and thyroid not enlarged, symmetric, no tenderness/mass/nodules Back: symmetric, no curvature. ROM normal. No CVA tenderness. Lungs: clear to auscultation bilaterally Heart: regular rate and rhythm, S1, S2 normal, no murmur, click, rub or gallop Abdomen: soft, non-tender; bowel  sounds normal; no masses,  no organomegaly Pulses: 2+ and symmetric Skin: Skin color, texture, turgor normal. No rashes or lesions Lymph nodes: Cervical, supraclavicular, and axillary nodes normal.  Lab Results  Component Value Date   HGBA1C 5.7 06/27/2013    Lab Results  Component Value Date   CREATININE 1.01 03/03/2016   CREATININE 0.90 07/23/2015   CREATININE 0.84 05/19/2015    Lab Results  Component Value Date   WBC 6.9 07/23/2015   HGB 12.4 07/23/2015   HCT 37.0 07/23/2015   PLT 198 07/23/2015   GLUCOSE 105 (H) 03/03/2016   CHOL 174 05/19/2015   TRIG 97.0 05/19/2015   HDL 47.50 05/19/2015   LDLCALC 107 (H) 05/19/2015   ALT 18 03/03/2016   AST 21 03/03/2016  NA 141 03/03/2016   K 4.9 03/03/2016   CL 102 03/03/2016   CREATININE 1.01 03/03/2016   BUN 19 03/03/2016   CO2 32 03/03/2016   TSH 0.33 (L) 03/03/2016   HGBA1C 5.7 06/27/2013    Dg Chest 2 View  Result Date: 02/20/2016 CLINICAL DATA:  Cough, congestion and wheezing. EXAM: CHEST  2 VIEW COMPARISON:  PA and lateral chest 07/23/2015 and 12/04/2011. FINDINGS: Chronic peribronchial thickening is unchanged. No consolidative process, pneumothorax or effusion. Heart size is normal. IMPRESSION: No active cardiopulmonary disease. Electronically Signed   By: Inge Rise M.D.   On: 02/20/2016 14:53    Assessment & Plan:   Problem List Items Addressed This Visit    Hypothyroid - Primary    Medication was refilled prior to repeat TSH being reviewed,  Will reduce weekly dose by adminstering 6 days per week. Repeat 6 months with other labs  Lab Results  Component Value Date   TSH 0.33 (L) 03/03/2016         Relevant Medications   levothyroxine (SYNTHROID, LEVOTHROID) 125 MCG tablet   Other Relevant Orders   TSH (Completed)   Asthma, chronic    WITH RECENT EXACERBATION due to allergic rhinitis. .  Now resolved by exam.  Continue prednisone taper .  Adding Singulair and Claritin        Relevant  Medications   montelukast (SINGULAIR) 10 MG tablet   budesonide-formoterol (SYMBICORT) 160-4.5 MCG/ACT inhaler   levalbuterol (XOPENEX) 0.63 MG/3ML nebulizer solution   Down's syndrome    She continues to thrive in a group home setting.  [facility]       Other Visit Diagnoses    Vitamin D deficiency       Relevant Orders   VITAMIN D 25 Hydroxy (Vit-D Deficiency, Fractures) (Completed)   Long-term use of high-risk medication       Relevant Orders   Comprehensive metabolic panel (Completed)    A total of 25 minutes of face to face time was spent with patient more than half of which was spent in counselling about the above mentioned conditions  and coordination of care   I have discontinued Ms. Ennen's levothyroxine. I have also changed her levothyroxine. Additionally, I am having her start on montelukast and loratadine. Lastly, I am having her maintain her gentamicin ointment, ondansetron, folic acid, multivitamin with minerals, carbamide peroxide, lactulose, Tiotropium Bromide Monohydrate, THEREMS, diclofenac sodium, senna, docusate sodium, benzonatate, doxycycline, predniSONE, guaiFENesin, alendronate, budesonide-formoterol, calcium carbonate, cholecalciferol, ketoconazole, levalbuterol, methotrexate, ONE-A-DAY VITACRAVES, and omeprazole.  Meds ordered this encounter  Medications  . montelukast (SINGULAIR) 10 MG tablet    Sig: Take 1 tablet (10 mg total) by mouth at bedtime.    Dispense:  30 tablet    Refill:  3  . loratadine (CLARITIN) 10 MG tablet    Sig: Take 1 tablet (10 mg total) by mouth daily.    Dispense:  30 tablet    Refill:  11  . alendronate (FOSAMAX) 70 MG tablet    Sig: Take 1 tablet (70 mg total) by mouth once a week. Take with a full glass of water on an empty stomach.    Dispense:  4 tablet    Refill:  3  . budesonide-formoterol (SYMBICORT) 160-4.5 MCG/ACT inhaler    Sig: Inhale 2 puffs into the lungs 2 (two) times daily.    Dispense:   1 Inhaler    Refill:  3  . calcium carbonate (OSCAL) 1500 (600 Ca)  MG TABS tablet    Sig: Take 1 tablet (1,500 mg total) by mouth daily.    Dispense:  30 tablet    Refill:  4  . cholecalciferol (VITAMIN D) 1000 units tablet    Sig: Take 1 tablet (1,000 Units total) by mouth daily.    Dispense:  30 tablet    Refill:  5  . ketoconazole (NIZORAL) 2 % cream    Sig: Apply 1 application topically daily.    Dispense:  60 g    Refill:  2  . levalbuterol (XOPENEX) 0.63 MG/3ML nebulizer solution    Sig: Take 3 mLs (0.63 mg total) by nebulization 3 (three) times daily as needed for wheezing or shortness of breath.    Dispense:  3 mL    Refill:  6  . DISCONTD: levothyroxine (SYNTHROID, LEVOTHROID) 125 MCG tablet    Sig: Take 1 tablet (125 mcg total) by mouth daily before breakfast.    Dispense:  30 tablet    Refill:  5  . methotrexate (RHEUMATREX) 2.5 MG tablet    Sig: Take 7 tablets (17.5 mg total) by mouth once a week. Caution:Chemotherapy. Protect from light.    Dispense:  28 tablet    Refill:  5  . Multiple Vitamins-Minerals (ONE-A-DAY VITACRAVES) CHEW    Sig: Two chewables daily    Dispense:  70 tablet    Refill:  6  . omeprazole (PRILOSEC) 20 MG capsule    Sig: Take 1 capsule (20 mg total) by mouth 2 (two) times daily.    Dispense:  60 capsule    Refill:  5  . levothyroxine (SYNTHROID, LEVOTHROID) 125 MCG tablet    Sig: Take 1 tablet (125 mcg total) by mouth daily before breakfast. 6 days per week ONLY (NOT 7)    Dispense:  30 tablet    Refill:  5    Medications Discontinued During This Encounter  Medication Reason  . alendronate (FOSAMAX) 70 MG tablet Reorder  . budesonide-formoterol (SYMBICORT) 160-4.5 MCG/ACT inhaler Reorder  . calcium carbonate (OSCAL) 1500 (600 Ca) MG TABS tablet Reorder  . cholecalciferol (VITAMIN D) 1000 units tablet Reorder  . ketoconazole (NIZORAL) 2 % cream Reorder  . levalbuterol (XOPENEX) 0.63 MG/3ML nebulizer solution Reorder  . levothyroxine  (SYNTHROID, LEVOTHROID) 125 MCG tablet Reorder  . methotrexate (RHEUMATREX) 2.5 MG tablet Reorder  . Multiple Vitamins-Minerals (ONE-A-DAY VITACRAVES) CHEW Reorder  . omeprazole (PRILOSEC) 20 MG capsule Reorder  . levothyroxine (SYNTHROID, LEVOTHROID) 125 MCG tablet Reorder    Follow-up: Return in about 6 months (around 09/01/2016).   Crecencio Mc, MD

## 2016-03-03 NOTE — Patient Instructions (Signed)
You can  Return for your flu shot any time after October 14th   We are adding singulair and claritin  Both daily to prevent allergy attacks  That lead to asthma attacks.   checking vitamin d and thyroid levels today along with other labs

## 2016-03-04 LAB — COMPREHENSIVE METABOLIC PANEL
ALT: 18 U/L (ref 0–35)
AST: 21 U/L (ref 0–37)
Albumin: 3.6 g/dL (ref 3.5–5.2)
Alkaline Phosphatase: 77 U/L (ref 39–117)
BUN: 19 mg/dL (ref 6–23)
CHLORIDE: 102 meq/L (ref 96–112)
CO2: 32 meq/L (ref 19–32)
CREATININE: 1.01 mg/dL (ref 0.40–1.20)
Calcium: 8.7 mg/dL (ref 8.4–10.5)
GFR: 62.3 mL/min (ref >60.00)
GLUCOSE: 105 mg/dL — AB (ref 70–99)
Potassium: 4.9 mEq/L (ref 3.5–5.1)
SODIUM: 141 meq/L (ref 135–145)
Total Bilirubin: 0.3 mg/dL (ref 0.2–1.2)
Total Protein: 6.7 g/dL (ref 6.0–8.3)

## 2016-03-04 LAB — VITAMIN D 25 HYDROXY (VIT D DEFICIENCY, FRACTURES): VITD: 38.18 ng/mL (ref 30.00–100.00)

## 2016-03-04 LAB — TSH: TSH: 0.33 u[IU]/mL — AB (ref 0.35–4.50)

## 2016-03-04 MED ORDER — LEVOTHYROXINE SODIUM 125 MCG PO TABS: 125.0000 ug | ORAL_TABLET | Freq: Every day | ORAL | 5 refills | Status: DC

## 2016-03-04 NOTE — Assessment & Plan Note (Signed)
She continues to thrive in a group home setting.  meds refilled and sent to [facility]

## 2016-03-04 NOTE — Assessment & Plan Note (Signed)
Medication was refilled prior to repeat TSH being reviewed,  Will reduce weekly dose by adminstering 6 days per week. Repeat 6 months with other labs  Lab Results  Component Value Date   TSH 0.33 (L) 03/03/2016

## 2016-03-04 NOTE — Assessment & Plan Note (Addendum)
WITH RECENT EXACERBATION due to allergic rhinitis. .  Now resolved by exam.  Continue prednisone taper .  Adding Singulair and Claritin

## 2016-03-05 ENCOUNTER — Ambulatory Visit: Payer: Medicare Other | Admitting: Podiatry

## 2016-03-25 ENCOUNTER — Ambulatory Visit (INDEPENDENT_AMBULATORY_CARE_PROVIDER_SITE_OTHER): Payer: Medicare Other

## 2016-03-25 DIAGNOSIS — S93141A Subluxation of metatarsophalangeal joint of right great toe, initial encounter: Secondary | ICD-10-CM | POA: Diagnosis not present

## 2016-03-25 DIAGNOSIS — M79673 Pain in unspecified foot: Secondary | ICD-10-CM | POA: Diagnosis not present

## 2016-03-25 DIAGNOSIS — R760 Raised antibody titer: Secondary | ICD-10-CM | POA: Diagnosis not present

## 2016-03-25 DIAGNOSIS — K5190 Ulcerative colitis, unspecified, without complications: Secondary | ICD-10-CM | POA: Diagnosis not present

## 2016-03-25 DIAGNOSIS — Z7951 Long term (current) use of inhaled steroids: Secondary | ICD-10-CM | POA: Diagnosis not present

## 2016-03-25 DIAGNOSIS — Z23 Encounter for immunization: Secondary | ICD-10-CM | POA: Diagnosis not present

## 2016-03-25 DIAGNOSIS — Z7952 Long term (current) use of systemic steroids: Secondary | ICD-10-CM | POA: Diagnosis not present

## 2016-03-25 DIAGNOSIS — M85872 Other specified disorders of bone density and structure, left ankle and foot: Secondary | ICD-10-CM | POA: Diagnosis not present

## 2016-03-25 DIAGNOSIS — Q909 Down syndrome, unspecified: Secondary | ICD-10-CM | POA: Diagnosis not present

## 2016-03-25 DIAGNOSIS — M479 Spondylosis, unspecified: Secondary | ICD-10-CM | POA: Diagnosis not present

## 2016-03-25 DIAGNOSIS — M19071 Primary osteoarthritis, right ankle and foot: Secondary | ICD-10-CM | POA: Diagnosis not present

## 2016-03-25 DIAGNOSIS — M79642 Pain in left hand: Secondary | ICD-10-CM | POA: Diagnosis not present

## 2016-03-25 DIAGNOSIS — J45909 Unspecified asthma, uncomplicated: Secondary | ICD-10-CM | POA: Diagnosis not present

## 2016-03-25 DIAGNOSIS — M2450 Contracture, unspecified joint: Secondary | ICD-10-CM | POA: Diagnosis not present

## 2016-03-25 DIAGNOSIS — H02053 Trichiasis without entropian right eye, unspecified eyelid: Secondary | ICD-10-CM | POA: Diagnosis not present

## 2016-03-25 DIAGNOSIS — M79641 Pain in right hand: Secondary | ICD-10-CM | POA: Diagnosis not present

## 2016-03-25 DIAGNOSIS — Z791 Long term (current) use of non-steroidal anti-inflammatories (NSAID): Secondary | ICD-10-CM | POA: Diagnosis not present

## 2016-03-25 DIAGNOSIS — M20039 Swan-neck deformity of unspecified finger(s): Secondary | ICD-10-CM | POA: Diagnosis not present

## 2016-03-25 DIAGNOSIS — Z7983 Long term (current) use of bisphosphonates: Secondary | ICD-10-CM | POA: Diagnosis not present

## 2016-03-25 DIAGNOSIS — M1990 Unspecified osteoarthritis, unspecified site: Secondary | ICD-10-CM | POA: Diagnosis not present

## 2016-03-25 DIAGNOSIS — Z79899 Other long term (current) drug therapy: Secondary | ICD-10-CM | POA: Diagnosis not present

## 2016-03-25 DIAGNOSIS — M19042 Primary osteoarthritis, left hand: Secondary | ICD-10-CM | POA: Diagnosis not present

## 2016-03-25 DIAGNOSIS — M19041 Primary osteoarthritis, right hand: Secondary | ICD-10-CM | POA: Diagnosis not present

## 2016-03-25 DIAGNOSIS — Z86718 Personal history of other venous thrombosis and embolism: Secondary | ICD-10-CM | POA: Diagnosis not present

## 2016-03-25 DIAGNOSIS — M064 Inflammatory polyarthropathy: Secondary | ICD-10-CM | POA: Diagnosis not present

## 2016-03-25 DIAGNOSIS — M069 Rheumatoid arthritis, unspecified: Secondary | ICD-10-CM | POA: Diagnosis not present

## 2016-03-26 ENCOUNTER — Telehealth: Payer: Self-pay | Admitting: Internal Medicine

## 2016-03-26 ENCOUNTER — Ambulatory Visit: Payer: Medicare Other | Admitting: Podiatry

## 2016-03-26 NOTE — Telephone Encounter (Signed)
I called pt care giver to schedule AWV. Thank you!

## 2016-03-30 ENCOUNTER — Other Ambulatory Visit: Payer: Self-pay | Admitting: Internal Medicine

## 2016-03-30 ENCOUNTER — Telehealth: Payer: Self-pay | Admitting: *Deleted

## 2016-03-30 NOTE — Telephone Encounter (Signed)
Refilled by Dr.Walker on 12/08/15. Pt last seen 03/03/16. Please advise?

## 2016-03-30 NOTE — Telephone Encounter (Signed)
[facility] requested pt recent lab results be faxed to their facility  Fax [place]

## 2016-03-31 NOTE — Telephone Encounter (Signed)
Labs faxed to facility

## 2016-04-01 MED ORDER — SENNOSIDES 8.6 MG PO TABS: 1.0000 | ORAL_TABLET | Freq: Every day | ORAL | 3 refills | Status: DC

## 2016-04-15 ENCOUNTER — Other Ambulatory Visit: Payer: Self-pay | Admitting: Internal Medicine

## 2016-04-29 ENCOUNTER — Encounter: Payer: Self-pay | Admitting: Podiatry

## 2016-04-29 ENCOUNTER — Ambulatory Visit (INDEPENDENT_AMBULATORY_CARE_PROVIDER_SITE_OTHER): Payer: Medicare Other | Admitting: Podiatry

## 2016-04-29 DIAGNOSIS — M79672 Pain in left foot: Secondary | ICD-10-CM

## 2016-04-29 DIAGNOSIS — L84 Corns and callosities: Secondary | ICD-10-CM

## 2016-04-29 DIAGNOSIS — L608 Other nail disorders: Secondary | ICD-10-CM

## 2016-04-29 DIAGNOSIS — L603 Nail dystrophy: Secondary | ICD-10-CM

## 2016-04-29 DIAGNOSIS — B351 Tinea unguium: Secondary | ICD-10-CM | POA: Diagnosis not present

## 2016-04-29 DIAGNOSIS — L851 Acquired keratosis [keratoderma] palmaris et plantaris: Secondary | ICD-10-CM

## 2016-04-29 DIAGNOSIS — M79671 Pain in right foot: Secondary | ICD-10-CM

## 2016-05-02 NOTE — Progress Notes (Signed)
SUBJECTIVE Patient  presents to office today complaining of elongated, thickened nails. Pain while ambulating in shoes. Patient is unable to trim their own nails.   OBJECTIVE General Patient is awake, alert, and oriented x 3 and in no acute distress. Derm hyperkeratotic lesions noted to the bilateral weightbearing surfaces of the bilateral feet 2. Skin is dry and supple bilateral. Negative open lesions or macerations. Remaining integument unremarkable. Nails are tender, long, thickened and dystrophic with subungual debris, consistent with onychomycosis, 1-5 bilateral. No signs of infection noted. Vasc  DP and PT pedal pulses palpable bilaterally. Temperature gradient within normal limits.  Neuro Epicritic and protective threshold sensation diminished bilaterally.  Musculoskeletal Exam No symptomatic pedal deformities noted bilateral. Muscular strength within normal limits.  ASSESSMENT 1. Onychodystrophic nails 1-5 bilateral with hyperkeratosis of nails.  2. Onychomycosis of nail due to dermatophyte bilateral 3. Pain in foot bilateral 3. Painful callus lesions bilateral feet  PLAN OF CARE 1. Patient evaluated today.  2. Instructed to maintain good pedal hygiene and foot care.  3. Mechanical debridement of nails 1-5 bilaterally performed using a nail nipper. Filed with dremel without incident.  4. Excisional debridement of callus lesions was performed using a chisel blade without incident.  5. Return to clinic in 3 mos.    Edrick Kins, DPM

## 2016-05-14 ENCOUNTER — Other Ambulatory Visit: Payer: Self-pay | Admitting: Internal Medicine

## 2016-05-14 NOTE — Telephone Encounter (Signed)
Filled on 04/19/16 #30, OV 03/03/16

## 2016-05-17 ENCOUNTER — Telehealth: Payer: Self-pay | Admitting: Internal Medicine

## 2016-05-17 ENCOUNTER — Inpatient Hospital Stay
Admission: EM | Admit: 2016-05-17 | Discharge: 2016-05-18 | DRG: 392 | Disposition: A | Discharge status: Home / Self Care | Payer: Medicare Other | Attending: Specialist | Admitting: Specialist

## 2016-05-17 ENCOUNTER — Emergency Department: Payer: Medicare Other

## 2016-05-17 DIAGNOSIS — R1084 Generalized abdominal pain: Secondary | ICD-10-CM

## 2016-05-17 DIAGNOSIS — Q909 Down syndrome, unspecified: Secondary | ICD-10-CM

## 2016-05-17 DIAGNOSIS — Z7983 Long term (current) use of bisphosphonates: Secondary | ICD-10-CM | POA: Diagnosis not present

## 2016-05-17 DIAGNOSIS — Z8249 Family history of ischemic heart disease and other diseases of the circulatory system: Secondary | ICD-10-CM | POA: Diagnosis not present

## 2016-05-17 DIAGNOSIS — A084 Viral intestinal infection, unspecified: Secondary | ICD-10-CM | POA: Diagnosis present

## 2016-05-17 DIAGNOSIS — Z86718 Personal history of other venous thrombosis and embolism: Secondary | ICD-10-CM | POA: Diagnosis not present

## 2016-05-17 DIAGNOSIS — Z823 Family history of stroke: Secondary | ICD-10-CM | POA: Diagnosis not present

## 2016-05-17 DIAGNOSIS — K6389 Other specified diseases of intestine: Secondary | ICD-10-CM | POA: Diagnosis not present

## 2016-05-17 DIAGNOSIS — Z90710 Acquired absence of both cervix and uterus: Secondary | ICD-10-CM | POA: Diagnosis not present

## 2016-05-17 DIAGNOSIS — Z7951 Long term (current) use of inhaled steroids: Secondary | ICD-10-CM

## 2016-05-17 DIAGNOSIS — Z833 Family history of diabetes mellitus: Secondary | ICD-10-CM

## 2016-05-17 DIAGNOSIS — K529 Noninfective gastroenteritis and colitis, unspecified: Secondary | ICD-10-CM | POA: Diagnosis not present

## 2016-05-17 DIAGNOSIS — J45909 Unspecified asthma, uncomplicated: Secondary | ICD-10-CM | POA: Diagnosis present

## 2016-05-17 DIAGNOSIS — E876 Hypokalemia: Secondary | ICD-10-CM | POA: Diagnosis present

## 2016-05-17 DIAGNOSIS — H9192 Unspecified hearing loss, left ear: Secondary | ICD-10-CM | POA: Diagnosis present

## 2016-05-17 DIAGNOSIS — R112 Nausea with vomiting, unspecified: Secondary | ICD-10-CM | POA: Diagnosis not present

## 2016-05-17 DIAGNOSIS — E039 Hypothyroidism, unspecified: Secondary | ICD-10-CM | POA: Diagnosis present

## 2016-05-17 DIAGNOSIS — Z79899 Other long term (current) drug therapy: Secondary | ICD-10-CM

## 2016-05-17 DIAGNOSIS — R197 Diarrhea, unspecified: Secondary | ICD-10-CM | POA: Diagnosis not present

## 2016-05-17 DIAGNOSIS — M069 Rheumatoid arthritis, unspecified: Secondary | ICD-10-CM | POA: Diagnosis present

## 2016-05-17 DIAGNOSIS — K5190 Ulcerative colitis, unspecified, without complications: Secondary | ICD-10-CM | POA: Diagnosis not present

## 2016-05-17 LAB — COMPREHENSIVE METABOLIC PANEL
ALBUMIN: 3.7 g/dL (ref 3.5–5.0)
ALK PHOS: 67 U/L (ref 38–126)
ALT: 24 U/L (ref 14–54)
AST: 42 U/L — AB (ref 15–41)
Anion gap: 9 (ref 5–15)
BILIRUBIN TOTAL: 0.9 mg/dL (ref 0.3–1.2)
BUN: 19 mg/dL (ref 6–20)
CALCIUM: 8.7 mg/dL — AB (ref 8.9–10.3)
CO2: 26 mmol/L (ref 22–32)
CREATININE: 0.93 mg/dL (ref 0.44–1.00)
Chloride: 106 mmol/L (ref 101–111)
GFR calc Af Amer: >60 mL/min (ref >60)
GFR calc non Af Amer: >60 mL/min (ref >60)
GLUCOSE: 124 mg/dL — AB (ref 65–99)
Potassium: 4.5 mmol/L (ref 3.5–5.1)
Sodium: 141 mmol/L (ref 135–145)
TOTAL PROTEIN: 6.8 g/dL (ref 6.5–8.1)

## 2016-05-17 LAB — C DIFFICILE QUICK SCREEN W PCR REFLEX
C DIFFICILE (CDIFF) INTERP: NOT DETECTED
C Diff antigen: NEGATIVE
C Diff toxin: NEGATIVE

## 2016-05-17 LAB — CBC
HEMATOCRIT: 44.3 % (ref 35.0–47.0)
HEMOGLOBIN: 15 g/dL (ref 12.0–16.0)
MCH: 34.7 pg — ABNORMAL HIGH (ref 26.0–34.0)
MCHC: 33.7 g/dL (ref 32.0–36.0)
MCV: 103 fL — ABNORMAL HIGH (ref 80.0–100.0)
Platelets: 304 10*3/uL (ref 150–440)
RBC: 4.31 MIL/uL (ref 3.80–5.20)
RDW: 16.4 % — ABNORMAL HIGH (ref 11.5–14.5)
WBC: 11.9 10*3/uL — ABNORMAL HIGH (ref 3.6–11.0)

## 2016-05-17 LAB — URINALYSIS, COMPLETE (UACMP) WITH MICROSCOPIC
Bacteria, UA: NONE SEEN
Bilirubin Urine: NEGATIVE
GLUCOSE, UA: NEGATIVE mg/dL
Hgb urine dipstick: NEGATIVE
Ketones, ur: NEGATIVE mg/dL
LEUKOCYTES UA: NEGATIVE
NITRITE: NEGATIVE
PH: 6 (ref 5.0–8.0)
Protein, ur: NEGATIVE mg/dL
SPECIFIC GRAVITY, URINE: 1.015 (ref 1.005–1.030)

## 2016-05-17 LAB — LIPASE, BLOOD: Lipase: 15 U/L (ref 11–51)

## 2016-05-17 MED ORDER — ENOXAPARIN SODIUM 40 MG/0.4ML ~~LOC~~ SOLN: 40.0000 mg | SUBCUTANEOUS | Status: DC

## 2016-05-17 MED ORDER — SODIUM CHLORIDE 0.9 % IV BOLUS (SEPSIS)
1000.0000 mL | Freq: Once | INTRAVENOUS | Status: AC
  Administered 2016-05-17: 1000 mL via INTRAVENOUS

## 2016-05-17 MED ORDER — LORATADINE 10 MG PO TABS
10.0000 mg | ORAL_TABLET | Freq: Every day | ORAL | Status: DC
  Administered 2016-05-18: 10 mg via ORAL
  Filled 2016-05-17: qty 1

## 2016-05-17 MED ORDER — LEVALBUTEROL HCL 0.63 MG/3ML IN NEBU: 0.6300 mg | INHALATION_SOLUTION | Freq: Three times a day (TID) | RESPIRATORY_TRACT | Status: DC | PRN

## 2016-05-17 MED ORDER — KETOCONAZOLE 2 % EX CREA
1.0000 "application " | TOPICAL_CREAM | Freq: Every day | CUTANEOUS | Status: DC
  Filled 2016-05-17: qty 15

## 2016-05-17 MED ORDER — IOPAMIDOL (ISOVUE-300) INJECTION 61%
30.0000 mL | Freq: Once | INTRAVENOUS | Status: AC | PRN
  Administered 2016-05-17: 30 mL via ORAL

## 2016-05-17 MED ORDER — KETOTIFEN FUMARATE 0.025 % OP SOLN
1.0000 [drp] | Freq: Two times a day (BID) | OPHTHALMIC | Status: DC
  Administered 2016-05-18: 1 [drp] via OPHTHALMIC
  Filled 2016-05-17: qty 5

## 2016-05-17 MED ORDER — ONDANSETRON HCL 4 MG/2ML IJ SOLN
4.0000 mg | Freq: Four times a day (QID) | INTRAMUSCULAR | Status: DC | PRN
  Administered 2016-05-17: 4 mg via INTRAVENOUS
  Filled 2016-05-17: qty 2

## 2016-05-17 MED ORDER — LEVOTHYROXINE SODIUM 125 MCG PO TABS
125.0000 ug | ORAL_TABLET | ORAL | Status: DC
  Administered 2016-05-18: 125 ug via ORAL
  Filled 2016-05-17: qty 1

## 2016-05-17 MED ORDER — CIPROFLOXACIN IN D5W 400 MG/200ML IV SOLN
400.0000 mg | Freq: Once | INTRAVENOUS | Status: AC
  Administered 2016-05-17: 400 mg via INTRAVENOUS
  Filled 2016-05-17: qty 200

## 2016-05-17 MED ORDER — METOCLOPRAMIDE HCL 5 MG/ML IJ SOLN
10.0000 mg | Freq: Once | INTRAMUSCULAR | Status: AC
  Administered 2016-05-17: 10 mg via INTRAVENOUS
  Filled 2016-05-17: qty 2

## 2016-05-17 MED ORDER — SODIUM CHLORIDE 0.9 % IV BOLUS (SEPSIS)
1000.0000 mL | Freq: Once | INTRAVENOUS | Status: AC
  Administered 2016-05-17: 1000 mL via INTRAVENOUS
  Filled 2016-05-17: qty 1000

## 2016-05-17 MED ORDER — PREDNISONE 1 MG PO TABS: 2.5000 mg | ORAL_TABLET | Freq: Every day | ORAL | Status: DC

## 2016-05-17 MED ORDER — LEVOTHYROXINE SODIUM 125 MCG PO TABS: 125.0000 ug | ORAL_TABLET | Freq: Every day | ORAL | Status: DC

## 2016-05-17 MED ORDER — PROMETHAZINE HCL 25 MG/ML IJ SOLN
25.0000 mg | Freq: Once | INTRAMUSCULAR | Status: AC
  Administered 2016-05-17: 25 mg via INTRAMUSCULAR
  Filled 2016-05-17: qty 1

## 2016-05-17 MED ORDER — METRONIDAZOLE IN NACL 5-0.79 MG/ML-% IV SOLN
500.0000 mg | Freq: Once | INTRAVENOUS | Status: AC
  Administered 2016-05-17: 500 mg via INTRAVENOUS
  Filled 2016-05-17: qty 100

## 2016-05-17 MED ORDER — MOMETASONE FURO-FORMOTEROL FUM 200-5 MCG/ACT IN AERO
2.0000 | INHALATION_SPRAY | Freq: Two times a day (BID) | RESPIRATORY_TRACT | Status: DC
  Administered 2016-05-18: 2 via RESPIRATORY_TRACT
  Filled 2016-05-17: qty 8.8

## 2016-05-17 MED ORDER — BENZONATATE 100 MG PO CAPS
100.0000 mg | ORAL_CAPSULE | Freq: Three times a day (TID) | ORAL | Status: DC | PRN
Start: 2016-05-17 — End: 2016-05-18

## 2016-05-17 MED ORDER — FOLIC ACID 1 MG PO TABS
1.0000 mg | ORAL_TABLET | Freq: Every day | ORAL | Status: DC
  Administered 2016-05-18: 1 mg via ORAL
  Filled 2016-05-17: qty 1

## 2016-05-17 MED ORDER — MONTELUKAST SODIUM 10 MG PO TABS: 10.0000 mg | ORAL_TABLET | Freq: Every day | ORAL | Status: DC

## 2016-05-17 MED ORDER — SODIUM CHLORIDE 0.9 % IV SOLN
INTRAVENOUS | Status: DC
  Administered 2016-05-17 – 2016-05-18 (×4): via INTRAVENOUS

## 2016-05-17 MED ORDER — GUAIFENESIN 100 MG/5ML PO SOLN
5.0000 mL | ORAL | Status: DC | PRN
  Filled 2016-05-17: qty 5

## 2016-05-17 MED ORDER — ONDANSETRON HCL 4 MG/2ML IJ SOLN
4.0000 mg | Freq: Once | INTRAMUSCULAR | Status: AC | PRN
  Administered 2016-05-17: 4 mg via INTRAVENOUS
  Filled 2016-05-17 (×2): qty 2

## 2016-05-17 MED ORDER — ACETAMINOPHEN 325 MG PO TABS: 650.0000 mg | ORAL_TABLET | Freq: Four times a day (QID) | ORAL | Status: DC | PRN

## 2016-05-17 MED ORDER — PANTOPRAZOLE SODIUM 40 MG PO TBEC
40.0000 mg | DELAYED_RELEASE_TABLET | Freq: Every day | ORAL | Status: DC
  Administered 2016-05-18: 40 mg via ORAL
  Filled 2016-05-17: qty 1

## 2016-05-17 MED ORDER — SENNA 8.6 MG PO TABS: 1.0000 | ORAL_TABLET | Freq: Every day | ORAL | Status: DC

## 2016-05-17 MED ORDER — DICLOFENAC SODIUM 1 % TD GEL
2.0000 g | Freq: Four times a day (QID) | TRANSDERMAL | Status: DC | PRN
  Filled 2016-05-17: qty 100

## 2016-05-17 MED ORDER — ONDANSETRON HCL 4 MG PO TABS: 4.0000 mg | ORAL_TABLET | Freq: Four times a day (QID) | ORAL | Status: DC | PRN

## 2016-05-17 MED ORDER — ACETAMINOPHEN 650 MG RE SUPP: 650.0000 mg | Freq: Four times a day (QID) | RECTAL | Status: DC | PRN

## 2016-05-17 MED ORDER — ONDANSETRON HCL 4 MG/2ML IJ SOLN
4.0000 mg | Freq: Once | INTRAMUSCULAR | Status: AC
  Administered 2016-05-17: 4 mg via INTRAVENOUS
  Filled 2016-05-17: qty 2

## 2016-05-17 MED ORDER — VITAMIN D 1000 UNITS PO TABS
1000.0000 [IU] | ORAL_TABLET | Freq: Every day | ORAL | Status: DC
  Administered 2016-05-18: 1000 [IU] via ORAL
  Filled 2016-05-17: qty 1

## 2016-05-17 MED ORDER — TIOTROPIUM BROMIDE MONOHYDRATE 18 MCG IN CAPS
18.0000 ug | ORAL_CAPSULE | Freq: Every day | RESPIRATORY_TRACT | Status: DC
  Administered 2016-05-18: 18 ug via RESPIRATORY_TRACT
  Filled 2016-05-17: qty 5

## 2016-05-17 MED ORDER — IOPAMIDOL (ISOVUE-300) INJECTION 61%
100.0000 mL | Freq: Once | INTRAVENOUS | Status: AC | PRN
  Administered 2016-05-17: 100 mL via INTRAVENOUS

## 2016-05-17 MED ORDER — DIPHENHYDRAMINE HCL 50 MG/ML IJ SOLN
25.0000 mg | Freq: Once | INTRAMUSCULAR | Status: AC
  Administered 2016-05-17: 25 mg via INTRAVENOUS
  Filled 2016-05-17: qty 1

## 2016-05-17 NOTE — ED Notes (Signed)
Second attempt to call report. Receiving nurse unable to take report at this time.

## 2016-05-17 NOTE — Telephone Encounter (Signed)
Mom notified.

## 2016-05-17 NOTE — ED Provider Notes (Signed)
  Physical Exam  BP 92/66 (BP Location: Right Arm)   Pulse 78   Temp 99.2 F (37.3 C) (Oral)   Resp 18   Ht 4' 9"  (1.448 m)   Wt 165 lb (74.8 kg)   LMP 04/11/1981   SpO2 97%   BMI 35.71 kg/m   Physical Exam  ED Course  Procedures  MDM Care assumed at sign out. Patient hx of down syndrome and UC on prednisone here with vomiting, diarrhea since last night. Numerous episodes of vomiting and watery diarrhea. Sign out pending reassessment after IVF. After 1 L [place] and phenergan, still vomiting and tachy around 120s. Ordered 2nd bolus and zofran and CT ab/pel.   9:29 AM Still vomiting after zofran. Given reglan/benadryl but still unable to tolerate PO. CT showed enteritis. She is at risk for C diff since she is on prednisone (no recent abx). C diff pending. Ordered cipro/flagy. Will admit for hydration for likely gastro with intractable vomiting.        Drenda Freeze, MD 05/17/16 0930

## 2016-05-17 NOTE — Clinical Social Work Note (Signed)
Clinical Social Work Assessment  Patient Details  Name: Cindy Oconnor MRN: [id number] Date of Birth: [birth year]  Date of referral:  05/17/16               Reason for consult:  Other (Comment Required) (From [facility]. )                Permission sought to share information with:  Facility Art therapist granted to share information::  Yes, Verbal Permission Granted  Name::        Agency::     Relationship::     Contact Information:     Housing/Transportation Living arrangements for the past 2 months:  Group Home Source of Information:  Guardian, Parent Patient Interpreter Needed:  None Criminal Activity/Legal Involvement Pertinent to Current Situation/Hospitalization:  No - Comment as needed Significant Relationships:  Parents Lives with:  Facility Resident Do you feel safe going back to the place where you live?  Yes Need for family participation in patient care:  Yes (Comment)  Care giving concerns:  Patient is a long term care resident at [facility] group home located at [address] [place] [address].     Social Worker assessment / plan:  Holiday representative (CSW) reviewed chart and noted that patient is from [facility] in [place] met with patient's mother/ guardian Cindy Oconnor at bedside. Patient was asleep during assessment. Per mother she is patient's guardian and takes patient home every other weekend. Per mother her son Cindy Oconnor also had down syndrome and lived at [facility]. Per Cindy Oconnor has passed away. Mother reported that patient has been vomiting and having diarrhea since Sunday night and has not been able to keep anything down. Per mother they did not get any sleep last night. CSW provided emotional support. Mother prefers for patient to return to [facility]. CSW left 2 voicemails for Cindy Cheshire RN. CSW will continue to follow and assist as needed.    Employment status:  Disabled (Comment on  whether or not currently receiving Disability) Insurance information:  Medicare, [place] PT Recommendations:  Not assessed at this time Information / Referral to community resources:  Other (Comment Required) (return to group home. )  Patient/Family's Response to care:  Patient's mother/ guardian prefers for patient to return to [facility].   Patient/Family's Understanding of and Emotional Response to Diagnosis, Current Treatment, and Prognosis:  Guardian was very pleasant and thanked CSW for visit.   Emotional Assessment Appearance:  Appears stated age Attitude/Demeanor/Rapport:  Unable to Assess Affect (typically observed):  Unable to Assess Orientation:  Oriented to Self, Oriented to Place, Oriented to Situation, Fluctuating Orientation (Suspected and/or reported Sundowners) Alcohol / Substance use:  Not Applicable Psych involvement (Current and /or in the community):  No (Comment)  Discharge Needs  Concerns to be addressed:  Discharge Planning Concerns Readmission within the last 30 days:  No Current discharge risk:  Other (Acute illness) Barriers to Discharge:  Continued Medical Work up   [employer], Veronia Beets, LCSW 05/17/2016, 4:13 PM

## 2016-05-17 NOTE — H&P (Signed)
[facility] [place] NAME: Cindy Oconnor    MR#:  [id number]  DATE OF BIRTH:  May 07, 1971  DATE OF ADMISSION:  05/17/2016  PRIMARY CARE PHYSICIAN: Crecencio Mc, MD   REQUESTING/REFERRING PHYSICIAN: Dr. Shirlyn Goltz  CHIEF COMPLAINT:   Chief Complaint  Patient presents with  . Emesis  . Diarrhea  . Fever  . Abdominal Pain    HISTORY OF PRESENT ILLNESS:  Cindy Oconnor  is a 45 y.o. female with a known history of Down syndrome, asthma, history of ulcerative colitis, rheumatoid arthritis, previous history of DVT who presents to the hospital due to nausea vomiting and diarrhea that began yesterday. As per the mom patient suddenly developed nausea vomiting and diarrhea after church late yesterday evening. Patient has had multiple episodes since then and the vomitus has been nonbloody and consistent with the food she ate. Her diarrhea has been watery and also nonbloody. She complains of some lower abdominal crampy pain but no other associated symptoms. There has been no fever, chest pain, shortness of breath, sick contacts or any other associated symptoms presently. Patient received multiple doses of anti-medics in the ER but continued to have vomiting and therefore hospitalist services were contacted further treatment and evaluation.  PAST MEDICAL HISTORY:   Past Medical History:  Diagnosis Date  . Arthritis   . Asthma   . Blood clot in vein   . Chicken pox   . Deafness in left ear   . Down syndrome   . RA (rheumatoid arthritis) ([place])   . Ulcerative colitis ([place])     PAST SURGICAL HISTORY:   Past Surgical History:  Procedure Laterality Date  . ABDOMINAL HYSTERECTOMY  1992   partial  . FOOT SURGERY     x2    SOCIAL HISTORY:   Social History  Substance Use Topics  . Smoking status: Never Smoker  . Smokeless tobacco: Never Used  . Alcohol use No    FAMILY HISTORY:   Family History  Problem Relation Age of Onset  .  Heart disease Mother   . Diabetes Mother   . Heart disease Father   . Diabetes Father   . Alcohol abuse Maternal Grandmother   . Arthritis Maternal Grandmother   . Stroke Maternal Grandmother   . Diabetes Maternal Grandmother   . Alcohol abuse Maternal Grandfather   . Arthritis Maternal Grandfather   . Stroke Maternal Grandfather   . Diabetes Maternal Grandfather     DRUG ALLERGIES:   Allergies  Allergen Reactions  . Erythromycin Other (See Comments), Rash and Shortness Of Breath    Reaction:  Unknown  Reaction:  Unknown   . Albuterol Other (See Comments) and Rash    Reaction:Makes pt jittery  "Jittery" Reaction:  Makes pt jittery     REVIEW OF SYSTEMS:   Review of Systems  Constitutional: Negative for fever and weight loss.  HENT: Negative for congestion, nosebleeds and tinnitus.   Eyes: Negative for blurred vision, double vision and redness.  Respiratory: Negative for cough, hemoptysis and shortness of breath.   Cardiovascular: Negative for chest pain, orthopnea, leg swelling and PND.  Gastrointestinal: Positive for abdominal pain, diarrhea, nausea and vomiting. Negative for melena.  Genitourinary: Negative for dysuria, hematuria and urgency.  Musculoskeletal: Negative for falls and joint pain.  Neurological: Negative for dizziness, tingling, sensory change, focal weakness, seizures, weakness and headaches.  Endo/Heme/Allergies: Negative for polydipsia. Does not bruise/bleed easily.  Psychiatric/Behavioral: Negative for depression and memory  loss. The patient is not nervous/anxious.     MEDICATIONS AT HOME:   Prior to Admission medications   Medication Sig Start Date End Date Taking? Authorizing Provider  alendronate (FOSAMAX) 70 MG tablet Take 1 tablet (70 mg total) by mouth once a week. Take with a full glass of water on an empty stomach. 03/03/16   Crecencio Mc, MD  benzonatate (TESSALON) 100 MG capsule Take 100 mg by mouth 3 (three) times daily as needed for  cough.     Historical Provider, MD  budesonide-formoterol (SYMBICORT) 160-4.5 MCG/ACT inhaler Inhale 2 puffs into the lungs 2 (two) times daily. 03/03/16   Crecencio Mc, MD  calcium carbonate (OSCAL) 1500 (600 Ca) MG TABS tablet Take 1 tablet (1,500 mg total) by mouth daily. 03/03/16   Crecencio Mc, MD  carbamide peroxide (DEBROX) 6.5 % otic solution Place 5 drops into both ears as needed (to remove earwax).    Historical Provider, MD  cholecalciferol (VITAMIN D) 1000 units tablet Take 1 tablet (1,000 Units total) by mouth daily. 03/03/16   Crecencio Mc, MD  diclofenac sodium (VOLTAREN) 1 % GEL Apply 2-4 g topically 4 (four) times daily as needed (for pain). 10/21/15   Jackolyn Confer, MD  docusate sodium (COLACE) 100 MG capsule Take 1 capsule (100 mg total) by mouth every other day. 01/07/16   Crecencio Mc, MD  doxycycline (VIBRA-TABS) 100 MG tablet Take 1 tablet (100 mg total) by mouth 2 (two) times daily. 02/20/16   Leone Haven, MD  folic acid (FOLVITE) 1 MG tablet TAKE ONE TABLET BY MOUTH ONCE A DAY. (FOLIC ACID SUPPLEMENT) 04/19/16   Crecencio Mc, MD  gentamicin ointment (GARAMYCIN) 0.1 % Apply 1 application topically 3 (three) times daily as needed (for rash).     Historical Provider, MD  guaiFENesin (ROBITUSSIN) 100 MG/5ML SOLN Take 5 mLs (100 mg total) by mouth every 4 (four) hours as needed for cough or to loosen phlegm. 02/26/16   Leone Haven, MD  ketoconazole (NIZORAL) 2 % cream Apply 1 application topically daily. 03/03/16   Crecencio Mc, MD  lactulose (CHRONULAC) 10 GM/15ML solution Take 30 g by mouth daily as needed for mild constipation.    Historical Provider, MD  levalbuterol Penne Lash) 0.63 MG/3ML nebulizer solution Take 3 mLs (0.63 mg total) by nebulization 3 (three) times daily as needed for wheezing or shortness of breath. 03/03/16   Crecencio Mc, MD  levothyroxine (SYNTHROID, LEVOTHROID) 125 MCG tablet Take 1 tablet (125 mcg total) by mouth daily before breakfast.  6 days per week ONLY (NOT 7) 03/04/16   Crecencio Mc, MD  loratadine (CLARITIN) 10 MG tablet Take 1 tablet (10 mg total) by mouth daily. 03/03/16   Crecencio Mc, MD  methotrexate (RHEUMATREX) 2.5 MG tablet Take 7 tablets (17.5 mg total) by mouth once a week. Caution:Chemotherapy. Protect from light. 03/03/16   Crecencio Mc, MD  montelukast (SINGULAIR) 10 MG tablet Take 1 tablet (10 mg total) by mouth at bedtime. 03/03/16   Crecencio Mc, MD  Multiple Vitamin (MULTIVITAMIN WITH MINERALS) TABS tablet Take 1 tablet by mouth daily.    Historical Provider, MD  Multiple Vitamin (THEREMS) TABS Take 1 tablet by mouth daily.  06/19/15   Historical Provider, MD  Multiple Vitamins-Minerals (ONE-A-DAY VITACRAVES) CHEW Two chewables daily 03/03/16   Crecencio Mc, MD  omeprazole (PRILOSEC) 20 MG capsule Take 1 capsule (20 mg total) by mouth 2 (two) times  daily. 03/03/16   Crecencio Mc, MD  ondansetron (ZOFRAN) 4 MG tablet Take 4 mg by mouth every 8 (eight) hours as needed for nausea or vomiting.     Historical Provider, MD  predniSONE (DELTASONE) 2.5 MG tablet TAKE 1 TABLET BY MOUTH DAILY *STARTING AFTER TAPER COMPLETED* 05/14/16   Crecencio Mc, MD  predniSONE (DELTASONE) 20 MG tablet Please take 30 mg (1.5 tablets) by mouth daily for 4 days, then take 20 mg (one tablet) by mouth daily for 4 days, then take 10 mg (0.5 tablets) by mouth daily for 4 days 02/23/16   Leone Haven, MD  senna (SENEXON) 8.6 MG tablet Take 1 tablet (8.6 mg total) by mouth at bedtime. 04/01/16   Crecencio Mc, MD  Tiotropium Bromide Monohydrate (SPIRIVA RESPIMAT) 1.25 MCG/ACT AERS Inhale 2.5 mcg into the lungs daily. Use two puffs daily 08/06/15   Jackolyn Confer, MD      VITAL SIGNS:  Blood pressure 105/76, pulse 94, temperature 99.2 F (37.3 C), temperature source Oral, resp. rate 18, height 4' 9"  (1.448 m), weight 74.8 kg (165 lb), last menstrual period 04/11/1981, SpO2 97 %.  PHYSICAL EXAMINATION:  Physical  Exam  GENERAL:  45 y.o.-year-old patient lying in the bed lethargic but in no apparent distress.  EYES: Pupils equal, round, reactive to light and accommodation. No scleral icterus. Extraocular muscles intact.  HEENT: Head atraumatic, normocephalic. Oropharynx and nasopharynx clear. No oropharyngeal erythema, dry oral mucosa  NECK:  Supple, no jugular venous distention. No thyroid enlargement, no tenderness.  LUNGS: Normal breath sounds bilaterally, no wheezing, rales, rhonchi. No use of accessory muscles of respiration.  CARDIOVASCULAR: S1, S2 RRR. 2/6 diastolic murmur at the base, No rubs, gallops, clicks.  ABDOMEN: Soft, nontender, nondistended. Bowel sounds present. No organomegaly or mass.  EXTREMITIES: No pedal edema, cyanosis, or clubbing. + 2 pedal & radial pulses b/l.   NEUROLOGIC: Cranial nerves II through XII are intact. No focal Motor or sensory deficits appreciated b/l PSYCHIATRIC: The patient is alert and oriented x 3.  SKIN: No obvious rash, lesion, or ulcer.   LABORATORY PANEL:   CBC  Recent Labs Lab 05/17/16 0448  WBC 11.9*  HGB 15.0  HCT 44.3  PLT 304   ------------------------------------------------------------------------------------------------------------------  Chemistries   Recent Labs Lab 05/17/16 0558  NA 141  K 4.5  CL 106  CO2 26  GLUCOSE 124*  BUN 19  CREATININE 0.93  CALCIUM 8.7*  AST 42*  ALT 24  ALKPHOS 67  BILITOT 0.9   ------------------------------------------------------------------------------------------------------------------  Cardiac Enzymes No results for input(s): TROPONINI in the last 168 hours. ------------------------------------------------------------------------------------------------------------------  RADIOLOGY:  Ct Abdomen Pelvis W Contrast  Result Date: 05/17/2016 CLINICAL DATA:  Elevated white blood cell count. Nausea, vomiting and diarrhea. EXAM: CT ABDOMEN AND PELVIS WITH CONTRAST TECHNIQUE:  Multidetector CT imaging of the abdomen and pelvis was performed using the standard protocol following bolus administration of intravenous contrast. CONTRAST:  156m ISOVUE-300 IOPAMIDOL (ISOVUE-300) INJECTION 61% COMPARISON:  None. FINDINGS: Lower chest: No acute abnormality. Hepatobiliary: 6 mm low-attenuation structure within the lateral segment of left lobe is too small to characterize. The gallbladder appears normal. There is no biliary dilatation. Pancreas: Unremarkable. No pancreatic ductal dilatation or surrounding inflammatory changes. Spleen: Normal in size without focal abnormality. Adrenals/Urinary Tract: Adrenal glands are unremarkable. Kidneys are normal, without renal calculi, focal lesion, or hydronephrosis. Bladder is unremarkable. Stomach/Bowel: Normal appearance of the stomach. There is no pathologic dilatation of the large or small bowel to  suggest obstruction. Mild small bowel wall thickening involving the proximal and mid small bowel, 29 series 2, 40 series 2. No perforation Vascular/Lymphatic: No significant vascular findings are present. No enlarged abdominal or pelvic lymph nodes. Reproductive: Status post hysterectomy. No adnexal masses. Other: No abdominal wall hernia or abnormality. No abdominopelvic ascites. Musculoskeletal: Degenerative disc disease identified within the lumbar spine. No aggressive lytic or sclerotic bone lesion identified. IMPRESSION: 1. Wall thickening involving the proximal mid small bowel loops identified compatible with enteritis. Likely inflammatory or infectious. No complications identified. Electronically Signed   By: Kerby Moors M.D.   On: 05/17/2016 09:15     IMPRESSION AND PLAN:   45 year old female with past medical history of Down syndrome, asthma, ulcerative colitis, rheumatoid arthritis, previous history of DVT who presents to the hospital due to nausea vomiting and diarrhea.  1. Viral gastroenteritis-this is likely the cause of patient's  nausea vomiting and diarrhea. Patient's stool for C. difficile is negative. -She is clinically afebrile and hemodynamically stable. Supportive care with IV fluids, antiemetics. -Check stool for comprehensive culture. She has received 1 dose of IV ciprofloxacin and Flagyl in the ER but I will hold off on antibiotics for now. -Place on clear liquid diet.  2. History of asthma-no acute exacerbation. Continue Symbicort, Spiriva.  3. Hypothyroidism-continue Synthroid.  4. History of rheumatoid arthritis-continue methotrexate, prednisone.  5. History of ulcerative colitis-unlikely her nausea vomiting and diarrhea is related to a flareup of her ulcerative colitis. -Continue her prednisone. If she is not clinically improving would consider gastroenterology consult.  Discussed plan of care with the patient's mother and she is in agreement.  All the records are reviewed and case discussed with ED provider. Management plans discussed with the patient, family and they are in agreement.  CODE STATUS: Full  TOTAL TIME TAKING CARE OF THIS PATIENT: 45 minutes.    Henreitta Leber M.D on 05/17/2016 at 10:07 AM  Between 7am to 6pm - Pager - [phone]  After 6pm go to [email or web address] - password [email or web address] [facility]  Office  [phone]  CC: Primary care physician; Crecencio Mc, MD

## 2016-05-17 NOTE — Telephone Encounter (Signed)
Pt is in hospital and needs to reschedule appt on 12/20 with Dr. Derrel Nip. The next available appt is not until 06/23/16. Pt is scheduled but DSS will look to cancel pt's medicaid because they need to reschedule within 2 weeks. Please advise, thank you!

## 2016-05-17 NOTE — ED Provider Notes (Signed)
[facility] Emergency Department Provider Note   ____________________________________________   First MD Initiated Contact with Patient 05/17/16 [phone]     (approximate)  I have reviewed the triage vital signs and the nursing notes.   HISTORY  Chief Complaint Emesis; Diarrhea; Fever; and Abdominal Pain  History obtained via caregiver  HPI Cindy Oconnor is a 45 y.o. female brought to the ED from home by her caregiver with a chief complaint of abdominal pain, nausea, vomiting and diarrhea. Sudden onset of symptoms since 8:30 PM. Patient has a history of Down syndrome and ulcerative colitis, on prednisone.Caregiver reports patient was in her baseline state of health until her symptoms began last evening. Does not feel she is having a flareup of her ulcerative colitis. Denies fever, chills, chest pain, shortness of breath, dysuria. Denies recent travel, trauma or antibiotic use. Nothing makes her symptoms better or worse.   Past Medical History:  Diagnosis Date  . Arthritis   . Asthma   . Blood clot in vein   . Chicken pox   . Deafness in left ear   . Down syndrome   . RA (rheumatoid arthritis) ([place])   . Ulcerative colitis [facility])     Patient Active Problem List   Diagnosis Date Noted  . Asthma with acute exacerbation 02/20/2016  . Airway hyperreactivity 09/26/2015  . Deep vein thrombosis (DVT) ([place]) 09/26/2015  . Herpes zona 09/26/2015  . Chronic ulcerative colitis ([place]) 09/26/2015  . Dysphagia 08/26/2015  . Triggering of digit 07/09/2014  . Colitis 01/30/2014  . Inflammatory arthritis 01/30/2014  . Osteoarthritis 01/30/2014  . Osteoporosis 01/30/2014  . Abnormal ECG 02/13/2013  . Mild tricuspid regurgitation 02/13/2013  . Routine general medical examination at a health care facility 01/24/2013  . Nonspecific abnormal electrocardiogram (ECG) (EKG) 01/24/2013  . Obesity (BMI 30-39.9) 01/21/2012  . Recurrent boils 03/12/2009  . ULCERATIVE  COLITIS 06/11/2006  . Rheumatoid arthritis ([place]) 06/11/2006  . OSTEOPENIA 06/11/2006  . HYSTERECTOMY, HX OF 06/11/2006  . Hypothyroid 03/18/2006  . Asthma, chronic 03/18/2006  . GERD 03/18/2006  . Down's syndrome 03/18/2006    Past Surgical History:  Procedure Laterality Date  . ABDOMINAL HYSTERECTOMY  1992   partial  . FOOT SURGERY     x2    Prior to Admission medications   Medication Sig Start Date End Date Taking? Authorizing Provider  alendronate (FOSAMAX) 70 MG tablet Take 1 tablet (70 mg total) by mouth once a week. Take with a full glass of water on an empty stomach. 03/03/16   Crecencio Mc, MD  benzonatate (TESSALON) 100 MG capsule Take 100 mg by mouth 3 (three) times daily as needed for cough.     Historical Provider, MD  budesonide-formoterol (SYMBICORT) 160-4.5 MCG/ACT inhaler Inhale 2 puffs into the lungs 2 (two) times daily. 03/03/16   Crecencio Mc, MD  calcium carbonate (OSCAL) 1500 (600 Ca) MG TABS tablet Take 1 tablet (1,500 mg total) by mouth daily. 03/03/16   Crecencio Mc, MD  carbamide peroxide (DEBROX) 6.5 % otic solution Place 5 drops into both ears as needed (to remove earwax).    Historical Provider, MD  cholecalciferol (VITAMIN D) 1000 units tablet Take 1 tablet (1,000 Units total) by mouth daily. 03/03/16   Crecencio Mc, MD  diclofenac sodium (VOLTAREN) 1 % GEL Apply 2-4 g topically 4 (four) times daily as needed (for pain). 10/21/15   Jackolyn Confer, MD  docusate sodium (COLACE) 100 MG capsule Take 1  capsule (100 mg total) by mouth every other day. 01/07/16   Crecencio Mc, MD  doxycycline (VIBRA-TABS) 100 MG tablet Take 1 tablet (100 mg total) by mouth 2 (two) times daily. 02/20/16   Leone Haven, MD  folic acid (FOLVITE) 1 MG tablet TAKE ONE TABLET BY MOUTH ONCE A DAY. (FOLIC ACID SUPPLEMENT) 04/19/16   Crecencio Mc, MD  gentamicin ointment (GARAMYCIN) 0.1 % Apply 1 application topically 3 (three) times daily as needed (for rash).     Historical  Provider, MD  guaiFENesin (ROBITUSSIN) 100 MG/5ML SOLN Take 5 mLs (100 mg total) by mouth every 4 (four) hours as needed for cough or to loosen phlegm. 02/26/16   Leone Haven, MD  ketoconazole (NIZORAL) 2 % cream Apply 1 application topically daily. 03/03/16   Crecencio Mc, MD  lactulose (CHRONULAC) 10 GM/15ML solution Take 30 g by mouth daily as needed for mild constipation.    Historical Provider, MD  levalbuterol Penne Lash) 0.63 MG/3ML nebulizer solution Take 3 mLs (0.63 mg total) by nebulization 3 (three) times daily as needed for wheezing or shortness of breath. 03/03/16   Crecencio Mc, MD  levothyroxine (SYNTHROID, LEVOTHROID) 125 MCG tablet Take 1 tablet (125 mcg total) by mouth daily before breakfast. 6 days per week ONLY (NOT 7) 03/04/16   Crecencio Mc, MD  loratadine (CLARITIN) 10 MG tablet Take 1 tablet (10 mg total) by mouth daily. 03/03/16   Crecencio Mc, MD  methotrexate (RHEUMATREX) 2.5 MG tablet Take 7 tablets (17.5 mg total) by mouth once a week. Caution:Chemotherapy. Protect from light. 03/03/16   Crecencio Mc, MD  montelukast (SINGULAIR) 10 MG tablet Take 1 tablet (10 mg total) by mouth at bedtime. 03/03/16   Crecencio Mc, MD  Multiple Vitamin (MULTIVITAMIN WITH MINERALS) TABS tablet Take 1 tablet by mouth daily.    Historical Provider, MD  Multiple Vitamin (THEREMS) TABS Take 1 tablet by mouth daily.  06/19/15   Historical Provider, MD  Multiple Vitamins-Minerals (ONE-A-DAY VITACRAVES) CHEW Two chewables daily 03/03/16   Crecencio Mc, MD  omeprazole (PRILOSEC) 20 MG capsule Take 1 capsule (20 mg total) by mouth 2 (two) times daily. 03/03/16   Crecencio Mc, MD  ondansetron (ZOFRAN) 4 MG tablet Take 4 mg by mouth every 8 (eight) hours as needed for nausea or vomiting.     Historical Provider, MD  predniSONE (DELTASONE) 2.5 MG tablet TAKE 1 TABLET BY MOUTH DAILY *STARTING AFTER TAPER COMPLETED* 05/14/16   Crecencio Mc, MD  predniSONE (DELTASONE) 20 MG tablet Please take  30 mg (1.5 tablets) by mouth daily for 4 days, then take 20 mg (one tablet) by mouth daily for 4 days, then take 10 mg (0.5 tablets) by mouth daily for 4 days 02/23/16   Leone Haven, MD  senna (SENEXON) 8.6 MG tablet Take 1 tablet (8.6 mg total) by mouth at bedtime. 04/01/16   Crecencio Mc, MD  Tiotropium Bromide Monohydrate (SPIRIVA RESPIMAT) 1.25 MCG/ACT AERS Inhale 2.5 mcg into the lungs daily. Use two puffs daily 08/06/15   Jackolyn Confer, MD    Allergies Erythromycin and Albuterol  Family History  Problem Relation Age of Onset  . Heart disease Mother   . Diabetes Mother   . Heart disease Father   . Diabetes Father   . Alcohol abuse Maternal Grandmother   . Arthritis Maternal Grandmother   . Stroke Maternal Grandmother   . Diabetes Maternal Grandmother   .  Alcohol abuse Maternal Grandfather   . Arthritis Maternal Grandfather   . Stroke Maternal Grandfather   . Diabetes Maternal Grandfather     Social History Social History  Substance Use Topics  . Smoking status: Never Smoker  . Smokeless tobacco: Never Used  . Alcohol use No    Review of Systems  Constitutional: No fever/chills. Eyes: No visual changes. ENT: No sore throat. Cardiovascular: Denies chest pain. Respiratory: Denies shortness of breath. Gastrointestinal: Positive for abdominal pain, nausea, vomiting and diarrhea.  No constipation. Genitourinary: Negative for dysuria. Musculoskeletal: Negative for back pain. Skin: Negative for rash. Neurological: Negative for headaches, focal weakness or numbness.  10-point ROS otherwise negative.  ____________________________________________   PHYSICAL EXAM:  VITAL SIGNS: ED Triage Vitals  Enc Vitals Group     BP 05/17/16 0423 92/66     Pulse Rate 05/17/16 0423 78     Resp 05/17/16 0423 18     Temp 05/17/16 0429 99.2 F (37.3 C)     Temp Source 05/17/16 0429 Oral     SpO2 05/17/16 0423 97 %     Weight 05/17/16 0426 165 lb (74.8 kg)     Height  05/17/16 0426 4' 9"  (1.448 m)     Head Circumference --      Peak Flow --      Pain Score 05/17/16 0426 10     Pain Loc --      Pain Edu? --      Excl. in [place]? --     Constitutional: Alert and oriented. Ill-appearing and in moderate acute distress. Eyes: Conjunctivae are normal. PERRL. EOMI. Head: Atraumatic. Nose: No congestion/rhinnorhea. Mouth/Throat: Mucous membranes are dry.  Oropharynx non-erythematous. Neck: No stridor.   Cardiovascular: Normal rate, regular rhythm. Grossly normal heart sounds.  Good peripheral circulation. Respiratory: Normal respiratory effort.  No retractions. Lungs CTAB. Gastrointestinal: Soft with mild diffuse tenderness to palpation. No distention. No abdominal bruits. No CVA tenderness. Musculoskeletal: No lower extremity tenderness nor edema.  No joint effusions. Neurologic:  Normal speech and language. No gross focal neurologic deficits are appreciated. No gait instability. Skin:  Skin is warm, dry and intact. No rash noted. Psychiatric: Mood and affect are normal. Speech and behavior are normal.  ____________________________________________   LABS (all labs ordered are listed, but only abnormal results are displayed)  Labs Reviewed  CBC - Abnormal; Notable for the following:       Result Value   WBC 11.9 (*)    MCV 103.0 (*)    MCH 34.7 (*)    RDW 16.4 (*)    All other components within normal limits  URINALYSIS, COMPLETE (UACMP) WITH MICROSCOPIC - Abnormal; Notable for the following:    Color, Urine YELLOW (*)    APPearance CLEAR (*)    Squamous Epithelial / LPF 0-5 (*)    All other components within normal limits  COMPREHENSIVE METABOLIC PANEL - Abnormal; Notable for the following:    Glucose, Bld 124 (*)    Calcium 8.7 (*)    AST 42 (*)    All other components within normal limits  C DIFFICILE QUICK SCREEN W PCR REFLEX  LIPASE, BLOOD    ____________________________________________  EKG  None ____________________________________________  RADIOLOGY  None ____________________________________________   PROCEDURES  Procedure(s) performed: None  Procedures  Critical Care performed: No  ____________________________________________   INITIAL IMPRESSION / ASSESSMENT AND PLAN / ED COURSE  Pertinent labs & imaging results that were available during my care of the patient were reviewed  by me and considered in my medical decision making (see chart for details).  45 year old female with Down syndrome and ulcerative colitis who presents with generalized abdominal cramping associated with nausea, vomiting and diarrhea. Will obtain screening lab work, initiate IV fluid resuscitation, IV antiemetic and reassess. Consider CT abdomen/pelvis if patient is not improved.  Clinical Course as of May 18 751  Mon May 17, 2016  0559 Difficulty establishing IV; IM Phenergan administered for vomiting.  [JS]  [id number] Patient resting in NAD. Remains tachycardic. Awaiting urine specimen. Care transferred to Dr. Darl Householder pending urine results and reassessment and possible CT scan. Anticipate hospital admission if patient is not improved.  [JS]    Clinical Course User Index [JS] Paulette Blanch, MD     ____________________________________________   FINAL CLINICAL IMPRESSION(S) / ED DIAGNOSES  Final diagnoses:  Nausea vomiting and diarrhea  Generalized abdominal pain      NEW MEDICATIONS STARTED DURING THIS VISIT:  New Prescriptions   No medications on file     Note:  This document was prepared using Dragon voice recognition software and may include unintentional dictation errors.    Paulette Blanch, MD 05/17/16 [phone]

## 2016-05-17 NOTE — Telephone Encounter (Signed)
06/07/16 at 4.30 please advise patient

## 2016-05-17 NOTE — ED Notes (Signed)
Attempted to call report. Receiving nurse will call back when available.

## 2016-05-17 NOTE — ED Triage Notes (Signed)
Pt presents to ED with c/o generalized abdominal pain with constant N/V/D since 830pm last night. Caregiver reports fever, but no actual temp taken at home today or PTA. Pt has h/x of autism.

## 2016-05-17 NOTE — NC FL2 (Signed)
[place] LEVEL OF CARE SCREENING TOOL     IDENTIFICATION  Patient Name: Cindy Oconnor Birthdate: 1971-04-10 Sex: female Admission Date (Current Location): 05/17/2016  [facility] and [place] Number:  Selena Lesser  ([id number] [facility]) Facility and Address:  [facility], [address] [place], [place] [address]      Provider Number: [id number]  Attending Physician Name and Address:  Henreitta Leber, MD  Relative Name and Phone Number:       Current Level of Care: Hospital Recommended Level of Care: Other (Comment) ([place] ) Prior Approval Number:    Date Approved/Denied:   PASRR Number:    Discharge Plan: Domiciliary (Rest home) ([place] )    Current Diagnoses: Patient Active Problem List   Diagnosis Date Noted  . Gastroenteritis 05/17/2016  . Asthma with acute exacerbation 02/20/2016  . Airway hyperreactivity 09/26/2015  . Deep vein thrombosis (DVT) ([place]) 09/26/2015  . Herpes zona 09/26/2015  . Chronic ulcerative colitis ([place]) 09/26/2015  . Dysphagia 08/26/2015  . Triggering of digit 07/09/2014  . Colitis 01/30/2014  . Inflammatory arthritis 01/30/2014  . Osteoarthritis 01/30/2014  . Osteoporosis 01/30/2014  . Abnormal ECG 02/13/2013  . Mild tricuspid regurgitation 02/13/2013  . Routine general medical examination at a health care facility 01/24/2013  . Nonspecific abnormal electrocardiogram (ECG) (EKG) 01/24/2013  . Obesity (BMI 30-39.9) 01/21/2012  . Recurrent boils 03/12/2009  . ULCERATIVE COLITIS 06/11/2006  . Rheumatoid arthritis ([place]) 06/11/2006  . OSTEOPENIA 06/11/2006  . HYSTERECTOMY, HX OF 06/11/2006  . Hypothyroid 03/18/2006  . Asthma, chronic 03/18/2006  . GERD 03/18/2006  . Down's syndrome 03/18/2006    Orientation RESPIRATION BLADDER Height & Weight     Self, Situation, Place  Normal Continent Weight: 169 lb (76.7 kg) Height:  4' 9"  (144.8 cm)  BEHAVIORAL SYMPTOMS/MOOD  NEUROLOGICAL BOWEL NUTRITION STATUS   (none)  (none) Incontinent Diet (Diet: Clear Liquid )  AMBULATORY STATUS COMMUNICATION OF NEEDS Skin   Independent Verbally Normal                       Personal Care Assistance Level of Assistance  Bathing, Feeding, Dressing Bathing Assistance: Independent Feeding assistance: Independent Dressing Assistance: Independent     Functional Limitations Info  Sight, Hearing, Speech Sight Info: Adequate Hearing Info: Adequate Speech Info: Adequate    SPECIAL CARE FACTORS FREQUENCY                       Contractures      Additional Factors Info  Code Status, Allergies, Isolation Precautions Code Status Info:  (Full Code. ) Allergies Info:  (Erythromycin, Albuterol)     Isolation Precautions Info:  (Enteric precautions (negative for c-diff) )     Current Medications (05/17/2016):  This is the current hospital active medication list Current Facility-Administered Medications  Medication Dose Route Frequency Provider Last Rate Last Dose  . 0.9 %  sodium chloride infusion   Intravenous Continuous Henreitta Leber, MD 125 mL/hr at 05/17/16 1302    . acetaminophen (TYLENOL) tablet 650 mg  650 mg Oral Q6H PRN Henreitta Leber, MD       Or  . acetaminophen (TYLENOL) suppository 650 mg  650 mg Rectal Q6H PRN Henreitta Leber, MD      . benzonatate (TESSALON) capsule 100 mg  100 mg Oral TID PRN Henreitta Leber, MD      . cholecalciferol (VITAMIN D) tablet 1,000  Units  1,000 Units Oral Daily Henreitta Leber, MD      . diclofenac sodium (VOLTAREN) 1 % transdermal gel 2-4 g  2-4 g Topical QID PRN Henreitta Leber, MD      . enoxaparin (LOVENOX) injection 40 mg  40 mg Subcutaneous Q24H Henreitta Leber, MD      . folic acid (FOLVITE) tablet 1 mg  1 mg Oral Daily Henreitta Leber, MD      . guaiFENesin (ROBITUSSIN) 100 MG/5ML solution 100 mg  5 mL Oral Q4H PRN Henreitta Leber, MD      . ketoconazole (NIZORAL) 2 % cream 1 application  1 application  Topical Daily Henreitta Leber, MD      . Derrill Memo ON 05/18/2016] ketotifen (ZADITOR) 0.025 % ophthalmic solution 1 drop  1 drop Both Eyes BID Henreitta Leber, MD      . Derrill Memo ON 05/18/2016] levothyroxine (SYNTHROID, LEVOTHROID) tablet 125 mcg  125 mcg Oral Once per day on Sun Mon Tue Wed Thu Fri Henreitta Leber, MD      . loratadine (CLARITIN) tablet 10 mg  10 mg Oral Daily Henreitta Leber, MD      . mometasone-formoterol (DULERA) 200-5 MCG/ACT inhaler 2 puff  2 puff Inhalation BID Henreitta Leber, MD      . montelukast (SINGULAIR) tablet 10 mg  10 mg Oral QHS Henreitta Leber, MD      . ondansetron (ZOFRAN) tablet 4 mg  4 mg Oral Q6H PRN Henreitta Leber, MD       Or  . ondansetron (ZOFRAN) injection 4 mg  4 mg Intravenous Q6H PRN Henreitta Leber, MD      . pantoprazole (PROTONIX) EC tablet 40 mg  40 mg Oral Daily Henreitta Leber, MD      . Derrill Memo ON 05/18/2016] predniSONE (DELTASONE) tablet 2.5 mg  2.5 mg Oral Q breakfast Henreitta Leber, MD      . senna (SENOKOT) tablet 8.6 mg  1 tablet Oral QHS Henreitta Leber, MD      . Derrill Memo ON 05/18/2016] tiotropium (SPIRIVA) inhalation capsule 18 mcg  18 mcg Inhalation Daily Henreitta Leber, MD         Discharge Medications: Please see discharge summary for a list of discharge medications.  Relevant Imaging Results:  Relevant Lab Results:   Additional Information  (SSN: [id number])  Calina Patrie, Veronia Beets, LCSW

## 2016-05-17 NOTE — Progress Notes (Addendum)
Patient is admitted from ER via cart and ER staff. NSL in place in LW. IV cipro infusing. Mother at bedside. Patient Alert, has a history of down syndrome. POC reviewed. On isolation, discussed with patient and mother.

## 2016-05-18 LAB — BASIC METABOLIC PANEL
ANION GAP: 5 (ref 5–15)
BUN: 9 mg/dL (ref 6–20)
CALCIUM: 7.1 mg/dL — AB (ref 8.9–10.3)
CO2: 23 mmol/L (ref 22–32)
CREATININE: 0.73 mg/dL (ref 0.44–1.00)
Chloride: 110 mmol/L (ref 101–111)
GFR calc Af Amer: >60 mL/min (ref >60)
Glucose, Bld: 82 mg/dL (ref 65–99)
Potassium: 3 mmol/L — ABNORMAL LOW (ref 3.5–5.1)
Sodium: 138 mmol/L (ref 135–145)

## 2016-05-18 LAB — CBC
HCT: 36.5 % (ref 35.0–47.0)
HEMOGLOBIN: 12.5 g/dL (ref 12.0–16.0)
MCH: 35.5 pg — AB (ref 26.0–34.0)
MCHC: 34.1 g/dL (ref 32.0–36.0)
MCV: 104.1 fL — ABNORMAL HIGH (ref 80.0–100.0)
Platelets: 222 10*3/uL (ref 150–440)
RBC: 3.51 MIL/uL — ABNORMAL LOW (ref 3.80–5.20)
RDW: 16.6 % — AB (ref 11.5–14.5)
WBC: 5 10*3/uL (ref 3.6–11.0)

## 2016-05-18 MED ORDER — POTASSIUM CHLORIDE ER 20 MEQ PO TBCR: 10.0000 meq | EXTENDED_RELEASE_TABLET | Freq: Two times a day (BID) | ORAL | 0 refills | Status: DC

## 2016-05-18 MED ORDER — LOPERAMIDE HCL 2 MG PO TABS: 2.0000 mg | ORAL_TABLET | Freq: Four times a day (QID) | ORAL | 0 refills | Status: DC | PRN

## 2016-05-18 MED ORDER — POTASSIUM CHLORIDE CRYS ER 20 MEQ PO TBCR
40.0000 meq | EXTENDED_RELEASE_TABLET | ORAL | Status: DC
  Administered 2016-05-18: 40 meq via ORAL
  Filled 2016-05-18: qty 2

## 2016-05-18 NOTE — Progress Notes (Signed)
Spoke with Dr. Marcille Blanco pt with a potassium of 3.0 this morning. MD to place orders.

## 2016-05-18 NOTE — Progress Notes (Signed)
Patient is medically stable for D/C back to [facility] today. Patient's mother/ guardian [name] will provide transport. Clinical Education officer, museum (CSW) sent D/C Summary and FL2 to [facility]. Butch Penny RN at [facility] is aware of D/C today. RN aware of above. Please reconsult if future social work needs arise. CSW signing off.   [employer], LCSW [phone]

## 2016-05-18 NOTE — Progress Notes (Signed)
Pt alert and oriented. Mother is at the bedside. Mother stating that pt was not able to keep water down yesterday. Pt tolerating sips of water this morning. Pt still with loose stools. Iv infusing without difficulty. Up to bedside commode this morning and voided large amount. Pt able to sleep in between care.

## 2016-05-18 NOTE — NC FL2 (Signed)
[place] LEVEL OF CARE SCREENING TOOL     IDENTIFICATION  Patient Name: Cindy Oconnor Birthdate: 09/13/1970 Sex: female Admission Date (Current Location): 05/17/2016  [facility] and [place] Number:  Selena Lesser  ([id number] [facility]) Facility and Address:  [facility], [address] [place], [place] [address]      Provider Number: [id number]  Attending Physician Name and Address:  Henreitta Leber, MD  Relative Name and Phone Number:       Current Level of Care: Hospital Recommended Level of Care: Other (Comment) ([place] ) Prior Approval Number:    Date Approved/Denied:   PASRR Number:    Discharge Plan: Domiciliary (Rest home) ([place] )    Current Diagnoses: Patient Active Problem List   Diagnosis Date Noted  . Gastroenteritis 05/17/2016  . Asthma with acute exacerbation 02/20/2016  . Airway hyperreactivity 09/26/2015  . Deep vein thrombosis (DVT) ([place]) 09/26/2015  . Herpes zona 09/26/2015  . Chronic ulcerative colitis ([place]) 09/26/2015  . Dysphagia 08/26/2015  . Triggering of digit 07/09/2014  . Colitis 01/30/2014  . Inflammatory arthritis 01/30/2014  . Osteoarthritis 01/30/2014  . Osteoporosis 01/30/2014  . Abnormal ECG 02/13/2013  . Mild tricuspid regurgitation 02/13/2013  . Routine general medical examination at a health care facility 01/24/2013  . Nonspecific abnormal electrocardiogram (ECG) (EKG) 01/24/2013  . Obesity (BMI 30-39.9) 01/21/2012  . Recurrent boils 03/12/2009  . ULCERATIVE COLITIS 06/11/2006  . Rheumatoid arthritis ([place]) 06/11/2006  . OSTEOPENIA 06/11/2006  . HYSTERECTOMY, HX OF 06/11/2006  . Hypothyroid 03/18/2006  . Asthma, chronic 03/18/2006  . GERD 03/18/2006  . Down's syndrome 03/18/2006    Orientation RESPIRATION BLADDER Height & Weight     Self, Situation, Place  Normal Continent Weight: 169 lb (76.7 kg) Height:  4' 9"  (144.8 cm)  BEHAVIORAL SYMPTOMS/MOOD  NEUROLOGICAL BOWEL NUTRITION STATUS   (none)  (none) Continent Regular Diet   AMBULATORY STATUS COMMUNICATION OF NEEDS Skin   Independent Verbally Normal                       Personal Care Assistance Level of Assistance  Bathing, Feeding, Dressing Bathing Assistance: Independent Feeding assistance: Independent Dressing Assistance: Independent     Functional Limitations Info  Sight, Hearing, Speech Sight Info: Adequate Hearing Info: Adequate Speech Info: Adequate    SPECIAL CARE FACTORS FREQUENCY                       Contractures      Additional Factors Info  Code Status, Allergies, Isolation Precautions Code Status Info:  (Full Code. ) Allergies Info:  (Erythromycin, Albuterol)     Isolation Precautions Info:  (Enteric precautions (negative for c-diff) )    Discharge Medications: Please see discharge summary for a list of discharge medications. Medication List    STOP taking these medications   doxycycline 100 MG tablet Commonly known as:  VIBRA-TABS    TAKE these medications   alendronate 70 MG tablet Commonly known as:  FOSAMAX Take 1 tablet (70 mg total) by mouth once a week. Take with a full glass of water on an empty stomach.  benzonatate 100 MG capsule Commonly known as:  TESSALON Take 100 mg by mouth 3 (three) times daily as needed for cough.  budesonide-formoterol 160-4.5 MCG/ACT inhaler Commonly known as:  SYMBICORT Inhale 2 puffs into the lungs 2 (two) times daily.  calcium carbonate 1500 (600 Ca) MG Tabs tablet Commonly  known as:  OSCAL Take 1 tablet (1,500 mg total) by mouth daily.  carbamide peroxide 6.5 % otic solution Commonly known as:  DEBROX Place 5 drops into both ears as needed (to remove earwax).  cholecalciferol 1000 units tablet Commonly known as:  VITAMIN D Take 1 tablet (1,000 Units total) by mouth daily.  diclofenac sodium 1 % Gel Commonly known as:  VOLTAREN Apply 2-4 g topically 4 (four) times daily as needed  (for pain).  docusate sodium 100 MG capsule Commonly known as:  COLACE Take 1 capsule (100 mg total) by mouth every other day.  folic acid 1 MG tablet Commonly known as:  FOLVITE TAKE ONE TABLET BY MOUTH ONCE A DAY. (FOLIC ACID SUPPLEMENT)  gentamicin ointment 0.1 % Commonly known as:  GARAMYCIN Apply 1 application topically 3 (three) times daily as needed (for rash).  guaiFENesin 100 MG/5ML Soln Commonly known as:  ROBITUSSIN Take 5 mLs (100 mg total) by mouth every 4 (four) hours as needed for cough or to loosen phlegm.  ketoconazole 2 % cream Commonly known as:  NIZORAL Apply 1 application topically daily.  ketotifen 0.025 % ophthalmic solution Commonly known as:  ZADITOR Place 1 drop into both eyes 2 (two) times daily as needed.  lactulose 10 GM/15ML solution Commonly known as:  CHRONULAC Take 30 g by mouth daily as needed for mild constipation.  levalbuterol 0.63 MG/3ML nebulizer solution Commonly known as:  XOPENEX Take 3 mLs (0.63 mg total) by nebulization 3 (three) times daily as needed for wheezing or shortness of breath.  levothyroxine 125 MCG tablet Commonly known as:  SYNTHROID, LEVOTHROID Take 1 tablet (125 mcg total) by mouth daily before breakfast. 6 days per week ONLY (NOT 7)  loperamide 2 MG tablet Commonly known as:  IMODIUM A-D Take 1 tablet (2 mg total) by mouth 4 (four) times daily as needed for diarrhea or loose stools.  loratadine 10 MG tablet Commonly known as:  CLARITIN Take 1 tablet (10 mg total) by mouth daily.  methotrexate 2.5 MG tablet Commonly known as:  RHEUMATREX Take 7 tablets (17.5 mg total) by mouth once a week. Caution:Chemotherapy. Protect from light.  montelukast 10 MG tablet Commonly known as:  SINGULAIR Take 1 tablet (10 mg total) by mouth at bedtime.  omeprazole 20 MG capsule Commonly known as:  PRILOSEC Take 1 capsule (20 mg total) by mouth 2 (two) times daily.  ondansetron 4 MG tablet Commonly known as:  ZOFRAN Take 4 mg by  mouth every 8 (eight) hours as needed for nausea or vomiting.  ONE-A-DAY VITACRAVES Chew Two chewables daily  Potassium Chloride ER 20 MEQ Tbcr Take 10 mEq by mouth 2 (two) times daily.  predniSONE 2.5 MG tablet Commonly known as:  DELTASONE TAKE 1 TABLET BY MOUTH DAILY *STARTING AFTER TAPER COMPLETED*  senna 8.6 MG tablet Commonly known as:  SENEXON Take 1 tablet (8.6 mg total) by mouth at bedtime.  THEREMS Tabs Take 1 tablet by mouth daily.  Tiotropium Bromide Monohydrate 1.25 MCG/ACT Aers Commonly known as:  SPIRIVA RESPIMAT Inhale 2.5 mcg into the lungs daily. Use two puffs daily    Relevant Imaging Results:   Relevant Lab Results:   Additional Information  (SSN: [id number])  Annelyse Rey, Veronia Beets, LCSW

## 2016-05-18 NOTE — Progress Notes (Signed)
Clinical Education officer, museum (CSW) received a call back from [employer] and gave her an update. Per Cindy Oconnor patient is on room air at baseline. CSW made Cindy Oconnor aware that per chart patient is on room air now. Per Cindy Oconnor patient can return to [employer] when stable. CSW will continue to follow and assist as needed.   [employer], LCSW [phone]

## 2016-05-18 NOTE — Discharge Summary (Signed)
[facility] [place] NAME: Cindy Oconnor    MR#:  [id number]  DATE OF BIRTH:  09-26-1970  DATE OF ADMISSION:  05/17/2016 ADMITTING PHYSICIAN: Henreitta Leber, MD  DATE OF DISCHARGE: 05/18/2016  PRIMARY CARE PHYSICIAN: Crecencio Mc, MD    ADMISSION DIAGNOSIS:  Generalized abdominal pain [R10.84] Nausea vomiting and diarrhea [R11.2, R19.7]  DISCHARGE DIAGNOSIS:  Active Problems:   Gastroenteritis   SECONDARY DIAGNOSIS:   Past Medical History:  Diagnosis Date  . Arthritis   . Asthma   . Blood clot in vein   . Chicken pox   . Deafness in left ear   . Down syndrome   . RA (rheumatoid arthritis) ([place])   . Ulcerative colitis [facility])     HOSPITAL COURSE:   45 year old female with past medical history of Down syndrome, asthma, ulcerative colitis, rheumatoid arthritis, previous history of DVT who presents to the hospital due to nausea vomiting and diarrhea.  1. Viral gastroenteritis-this was the cause of patient's nausea vomiting and diarrhea. Patient's stool for C. difficile was negative. -Patient was treated supportively with IV fluids, antiemetics. She has clinically improved overnight and is not tolerating a soft diet well. She's had no further nausea or vomiting while in the hospital. She continues to have some diarrhea for which she will continue some Imodium as needed.  2. History of asthma-she had no acute exacerbation. She will Continue Symbicort, Spiriva and her Xopenex nebs PRN.  3. Hypothyroidism- she will continue Synthroid.  4. History of rheumatoid arthritis- she will continue methotrexate, prednisone.  5. History of ulcerative colitis-no acute flare up while in the hospital.  - she will cont. her prednisone.   6. Hypokalemia - due to Diarrhea.  - will discharged on oral Potassium supplements.   DISCHARGE CONDITIONS:   Stable  CONSULTS OBTAINED:    DRUG ALLERGIES:   Allergies  Allergen Reactions   . Erythromycin Other (See Comments), Rash and Shortness Of Breath    Reaction:  Unknown  Reaction:  Unknown   . Albuterol Other (See Comments) and Rash    Reaction:Makes pt jittery  "Jittery" Reaction:  Makes pt jittery     DISCHARGE MEDICATIONS:   Allergies as of 05/18/2016      Reactions   Erythromycin Other (See Comments), Rash, Shortness Of Breath   Reaction:  Unknown  Reaction:  Unknown    Albuterol Other (See Comments), Rash   Reaction:Makes pt jittery  "Jittery" Reaction:  Makes pt jittery       Medication List    STOP taking these medications   doxycycline 100 MG tablet Commonly known as:  VIBRA-TABS     TAKE these medications   alendronate 70 MG tablet Commonly known as:  FOSAMAX Take 1 tablet (70 mg total) by mouth once a week. Take with a full glass of water on an empty stomach.   benzonatate 100 MG capsule Commonly known as:  TESSALON Take 100 mg by mouth 3 (three) times daily as needed for cough.   budesonide-formoterol 160-4.5 MCG/ACT inhaler Commonly known as:  SYMBICORT Inhale 2 puffs into the lungs 2 (two) times daily.   calcium carbonate 1500 (600 Ca) MG Tabs tablet Commonly known as:  OSCAL Take 1 tablet (1,500 mg total) by mouth daily.   carbamide peroxide 6.5 % otic solution Commonly known as:  DEBROX Place 5 drops into both ears as needed (to remove earwax).   cholecalciferol 1000 units tablet Commonly known as:  VITAMIN D Take 1 tablet (1,000 Units total) by mouth daily.   diclofenac sodium 1 % Gel Commonly known as:  VOLTAREN Apply 2-4 g topically 4 (four) times daily as needed (for pain).   docusate sodium 100 MG capsule Commonly known as:  COLACE Take 1 capsule (100 mg total) by mouth every other day.   folic acid 1 MG tablet Commonly known as:  FOLVITE TAKE ONE TABLET BY MOUTH ONCE A DAY. (FOLIC ACID SUPPLEMENT)   gentamicin ointment 0.1 % Commonly known as:  GARAMYCIN Apply 1 application topically 3 (three) times  daily as needed (for rash).   guaiFENesin 100 MG/5ML Soln Commonly known as:  ROBITUSSIN Take 5 mLs (100 mg total) by mouth every 4 (four) hours as needed for cough or to loosen phlegm.   ketoconazole 2 % cream Commonly known as:  NIZORAL Apply 1 application topically daily.   ketotifen 0.025 % ophthalmic solution Commonly known as:  ZADITOR Place 1 drop into both eyes 2 (two) times daily as needed.   lactulose 10 GM/15ML solution Commonly known as:  CHRONULAC Take 30 g by mouth daily as needed for mild constipation.   levalbuterol 0.63 MG/3ML nebulizer solution Commonly known as:  XOPENEX Take 3 mLs (0.63 mg total) by nebulization 3 (three) times daily as needed for wheezing or shortness of breath.   levothyroxine 125 MCG tablet Commonly known as:  SYNTHROID, LEVOTHROID Take 1 tablet (125 mcg total) by mouth daily before breakfast. 6 days per week ONLY (NOT 7)   loperamide 2 MG tablet Commonly known as:  IMODIUM A-D Take 1 tablet (2 mg total) by mouth 4 (four) times daily as needed for diarrhea or loose stools.   loratadine 10 MG tablet Commonly known as:  CLARITIN Take 1 tablet (10 mg total) by mouth daily.   methotrexate 2.5 MG tablet Commonly known as:  RHEUMATREX Take 7 tablets (17.5 mg total) by mouth once a week. Caution:Chemotherapy. Protect from light.   montelukast 10 MG tablet Commonly known as:  SINGULAIR Take 1 tablet (10 mg total) by mouth at bedtime.   omeprazole 20 MG capsule Commonly known as:  PRILOSEC Take 1 capsule (20 mg total) by mouth 2 (two) times daily.   ondansetron 4 MG tablet Commonly known as:  ZOFRAN Take 4 mg by mouth every 8 (eight) hours as needed for nausea or vomiting.   ONE-A-DAY VITACRAVES Chew Two chewables daily   Potassium Chloride ER 20 MEQ Tbcr Take 10 mEq by mouth 2 (two) times daily.   predniSONE 2.5 MG tablet Commonly known as:  DELTASONE TAKE 1 TABLET BY MOUTH DAILY *STARTING AFTER TAPER COMPLETED*   senna 8.6  MG tablet Commonly known as:  SENEXON Take 1 tablet (8.6 mg total) by mouth at bedtime.   THEREMS Tabs Take 1 tablet by mouth daily.   Tiotropium Bromide Monohydrate 1.25 MCG/ACT Aers Commonly known as:  SPIRIVA RESPIMAT Inhale 2.5 mcg into the lungs daily. Use two puffs daily         DISCHARGE INSTRUCTIONS:   DIET:  Regular diet  DISCHARGE CONDITION:  Stable  ACTIVITY:  Activity as tolerated  OXYGEN:  Home Oxygen: No.   Oxygen Delivery: room air  DISCHARGE LOCATION:  group home   If you experience worsening of your admission symptoms, develop shortness of breath, life threatening emergency, suicidal or homicidal thoughts you must seek medical attention immediately by calling 911 or calling your MD immediately  if symptoms less severe.  You Must read complete  instructions/literature along with all the possible adverse reactions/side effects for all the Medicines you take and that have been prescribed to you. Take any new Medicines after you have completely understood and accpet all the possible adverse reactions/side effects.   Please note  You were cared for by a hospitalist during your hospital stay. If you have any questions about your discharge medications or the care you received while you were in the hospital after you are discharged, you can call the unit and asked to speak with the hospitalist on call if the hospitalist that took care of you is not available. Once you are discharged, your primary care physician will handle any further medical issues. Please note that NO REFILLS for any discharge medications will be authorized once you are discharged, as it is imperative that you return to your primary care physician (or establish a relationship with a primary care physician if you do not have one) for your aftercare needs so that they can reassess your need for medications and monitor your lab values.     Today   No N/V today. Tolerating Full liquid diet will  and will advance.  Mom at bedside.  Still has diarrhea which his watery but improved.   VITAL SIGNS:  Blood pressure (!) 90/50, pulse 75, temperature 97.8 F (36.6 C), temperature source Oral, resp. rate 18, height 4' 9"  (1.448 m), weight 76.7 kg (169 lb), last menstrual period 04/11/1981, SpO2 90 %.  I/O:   Intake/Output Summary (Last 24 hours) at 05/18/16 1425 Last data filed at 05/18/16 0900  Gross per 24 hour  Intake          1991.25 ml  Output                0 ml  Net          1991.25 ml    PHYSICAL EXAMINATION:  GENERAL:  45 y.o.-year-old patient lying in the bed with no acute distress.  EYES: Pupils equal, round, reactive to light and accommodation. No scleral icterus. Extraocular muscles intact.  HEENT: Head atraumatic, normocephalic. Oropharynx and nasopharynx clear.  NECK:  Supple, no jugular venous distention. No thyroid enlargement, no tenderness.  LUNGS: Normal breath sounds bilaterally, no wheezing, rales,rhonchi. No use of accessory muscles of respiration.  CARDIOVASCULAR: S1, S2 normal. No murmurs, rubs, or gallops.  ABDOMEN: Soft, non-tender, non-distended. Bowel sounds present. No organomegaly or mass.  EXTREMITIES: No pedal edema, cyanosis, or clubbing.  NEUROLOGIC: Cranial nerves II through XII are intact. No focal motor or sensory defecits b/l.  PSYCHIATRIC: The patient is alert and oriented x 3. Good affect.  SKIN: No obvious rash, lesion, or ulcer.   DATA REVIEW:   CBC  Recent Labs Lab 05/18/16 0409  WBC 5.0  HGB 12.5  HCT 36.5  PLT 222    Chemistries   Recent Labs Lab 05/17/16 0558 05/18/16 0409  NA 141 138  K 4.5 3.0*  CL 106 110  CO2 26 23  GLUCOSE 124* 82  BUN 19 9  CREATININE 0.93 0.73  CALCIUM 8.7* 7.1*  AST 42*  --   ALT 24  --   ALKPHOS 67  --   BILITOT 0.9  --     Cardiac Enzymes No results for input(s): TROPONINI in the last 168 hours.  Microbiology Results  Results for orders placed or performed during the hospital  encounter of 05/17/16  C difficile quick scan w PCR reflex     Status: None   Collection  Time: 05/17/16  7:59 AM  Result Value Ref Range Status   C Diff antigen NEGATIVE NEGATIVE Final   C Diff toxin NEGATIVE NEGATIVE Final   C Diff interpretation No C. difficile detected.  Final    RADIOLOGY:  Ct Abdomen Pelvis W Contrast  Result Date: 05/17/2016 CLINICAL DATA:  Elevated white blood cell count. Nausea, vomiting and diarrhea. EXAM: CT ABDOMEN AND PELVIS WITH CONTRAST TECHNIQUE: Multidetector CT imaging of the abdomen and pelvis was performed using the standard protocol following bolus administration of intravenous contrast. CONTRAST:  151m ISOVUE-300 IOPAMIDOL (ISOVUE-300) INJECTION 61% COMPARISON:  None. FINDINGS: Lower chest: No acute abnormality. Hepatobiliary: 6 mm low-attenuation structure within the lateral segment of left lobe is too small to characterize. The gallbladder appears normal. There is no biliary dilatation. Pancreas: Unremarkable. No pancreatic ductal dilatation or surrounding inflammatory changes. Spleen: Normal in size without focal abnormality. Adrenals/Urinary Tract: Adrenal glands are unremarkable. Kidneys are normal, without renal calculi, focal lesion, or hydronephrosis. Bladder is unremarkable. Stomach/Bowel: Normal appearance of the stomach. There is no pathologic dilatation of the large or small bowel to suggest obstruction. Mild small bowel wall thickening involving the proximal and mid small bowel, 29 series 2, 40 series 2. No perforation Vascular/Lymphatic: No significant vascular findings are present. No enlarged abdominal or pelvic lymph nodes. Reproductive: Status post hysterectomy. No adnexal masses. Other: No abdominal wall hernia or abnormality. No abdominopelvic ascites. Musculoskeletal: Degenerative disc disease identified within the lumbar spine. No aggressive lytic or sclerotic bone lesion identified. IMPRESSION: 1. Wall thickening involving the proximal mid  small bowel loops identified compatible with enteritis. Likely inflammatory or infectious. No complications identified. Electronically Signed   By: TKerby MoorsM.D.   On: 05/17/2016 09:15      Management plans discussed with the patient, family and they are in agreement.  CODE STATUS:     Code Status Orders        Start     Ordered   05/17/16 1208  Full code  Continuous     05/17/16 1207    Code Status History    Date Active Date Inactive Code Status Order ID Comments User Context   07/23/2015  8:35 PM 07/25/2015  3:27 PM Full Code [id number] SHillary Bow MD ED      TOTAL TIME TAKING CARE OF THIS PATIENT: 40 minutes.    SHenreitta LeberM.D on 05/18/2016 at 2:25 PM  Between 7am to 6pm - Pager - [phone]  After 6pm go to [email or web address] - pProofreader [facility]  Office  [phone] CC: Primary care physician; TCrecencio Mc MD

## 2016-05-18 NOTE — Telephone Encounter (Signed)
Earliest would be the 29th at 430, Please advise?

## 2016-05-18 NOTE — Telephone Encounter (Signed)
[place] calling to schedule HFU for nausea, vomitting and abdominal pain. She is being discharged today.

## 2016-05-18 NOTE — Progress Notes (Signed)
Patient was discharged to [facility] home. Called [facility] and reviewed new scripts and no changes to regular meds. IV removed by Jonelle Sidle, RN. Packet sent with patient's mom.

## 2016-05-18 NOTE — Telephone Encounter (Signed)
29th at 4:30 is fine

## 2016-05-19 ENCOUNTER — Encounter: Payer: Medicare Other | Admitting: Internal Medicine

## 2016-05-19 ENCOUNTER — Other Ambulatory Visit: Payer: Self-pay | Admitting: Internal Medicine

## 2016-05-19 NOTE — Telephone Encounter (Signed)
Left a message for [name] (patient's mom) to complete TCM and to change appt date and time.

## 2016-05-20 NOTE — Telephone Encounter (Signed)
Was patient to remain on prednisone? Last OV was 10 /26/17

## 2016-05-20 NOTE — Telephone Encounter (Signed)
Notified patients mother of Prednisone 2.5 mg sent into pharmacy

## 2016-05-20 NOTE — Telephone Encounter (Signed)
Left a second Message for appt change and to complete TCM, left message.

## 2016-05-21 ENCOUNTER — Ambulatory Visit: Payer: Medicare Other

## 2016-05-25 ENCOUNTER — Other Ambulatory Visit: Payer: Self-pay | Admitting: Internal Medicine

## 2016-06-02 ENCOUNTER — Telehealth: Payer: Self-pay | Admitting: Internal Medicine

## 2016-06-02 NOTE — Telephone Encounter (Signed)
Pt caregiver declined to get the AWV. Thank you!

## 2016-06-07 ENCOUNTER — Ambulatory Visit (INDEPENDENT_AMBULATORY_CARE_PROVIDER_SITE_OTHER): Payer: Medicare Other | Admitting: Internal Medicine

## 2016-06-07 ENCOUNTER — Encounter: Payer: Self-pay | Admitting: Internal Medicine

## 2016-06-07 ENCOUNTER — Encounter: Payer: Medicare Other | Admitting: Internal Medicine

## 2016-06-07 VITALS — BP 118/80 | HR 75 | Temp 98.0°F | Resp 16 | Ht 59.0 in | Wt 162.4 lb

## 2016-06-07 DIAGNOSIS — E538 Deficiency of other specified B group vitamins: Secondary | ICD-10-CM | POA: Diagnosis not present

## 2016-06-07 DIAGNOSIS — D7589 Other specified diseases of blood and blood-forming organs: Secondary | ICD-10-CM

## 2016-06-07 DIAGNOSIS — R5383 Other fatigue: Secondary | ICD-10-CM | POA: Diagnosis not present

## 2016-06-07 DIAGNOSIS — Z23 Encounter for immunization: Secondary | ICD-10-CM | POA: Diagnosis not present

## 2016-06-07 DIAGNOSIS — E876 Hypokalemia: Secondary | ICD-10-CM

## 2016-06-07 DIAGNOSIS — Z0000 Encounter for general adult medical examination without abnormal findings: Secondary | ICD-10-CM

## 2016-06-07 MED ORDER — AEROCHAMBER PLUS FLO-VU MEDIUM MISC: 1.0000 | Freq: Once | 0 refills | Status: AC

## 2016-06-07 NOTE — Patient Instructions (Addendum)
I am prescribing a spacer  ("AEROCHAMBER") to use with your symbicort inhaler. Using this  will help mix the medicine and make the particles smaller so they will travel deeper into your lungs   You can try using the nizoral under her breast whenever the are gets red and itchy.  This is due to a yeast (candida) infection  Make sure she rinses her mouth well after using the Symbicort,  To prevent thrush  I have given her the Pneumovax vaccine today.  Increasing her prednisone from 2.5 mg to 5 mg probably won't affect her asthma attacks at all.

## 2016-06-07 NOTE — Progress Notes (Signed)
Patient ID: Cindy Oconnor, female    DOB: [birth year]  Age: 46 y.o. MRN: [id number]  The patient is here for annualwellness examination and management of other chronic and acute problems.  May be due to pneumonia vaccine  Admitted to [facility] on Dec 18 for N/V secondary to viral gastroenteritis and discharged dec 19  accompanied by Jacqlyn Larsen from the group home [facility] , managed by Merlene Morse.   On 2.5 mg prednisone daily by rheumatology   Having trouble using the inhalers.     The risk factors are reflected in the social history.  The roster of all physicians providing medical care to patient - is listed in the Snapshot section of the chart.  Activities of daily living:  The patient is not independent in all ADLs: dressing, toileting, feeding as well as independent mobility ;lives in a group home [place] signed today   Home safety : The patient has smoke detectors in the home. They wear seatbelts.  There are no firearms at home. There is no violence in the home.   There is no risks for hepatitis, STDs or HIV. There is no   history of blood transfusion. They have no travel history to infectious disease endemic areas of the world.  The patient has seen their dentist in the last six month. They have seen their eye doctor in the last year. They admit to slight hearing difficulty with regard to whispered voices and some television programs.  They have deferred audiologic testing in the last year.  They do not  have excessive sun exposure. Discussed the need for sun protection: hats, long sleeves and use of sunscreen if there is significant sun exposure.   Diet: the importance of a healthy diet is discussed. They do have a healthy diet.  The benefits of regular aerobic exercise were discussed. She walks 4 times per week ,  20 minutes.   Depression screen: there are no signs or vegative symptoms of depression- irritability, change in appetite, anhedonia,  sadness/tearfullness.  Cognitive assessment: the patient cannot manage all their financial and personal affairs and is actively engaged. They coud relate day,date,year and events; recalled 2/3 objects at 3 minutes; performed clock-face test normally.  The following portions of the patient's history were reviewed and updated as appropriate: allergies, current medications, past family history, past medical history,  past surgical history, past social history  and problem list.  Visual acuity was not assessed per patient preference since she has regular follow up with her ophthalmologist. Hearing and body mass index were assessed and reviewed.   During the course of the visit the patient was educated and counseled about appropriate screening and preventive services including : fall prevention , diabetes screening, nutrition counseling, colorectal cancer screening, and recommended immunizations.    CC: The primary encounter diagnosis was Hypokalemia. Diagnoses of Macrocytosis without anemia, Fatigue, unspecified type, B12 deficiency, Need for prophylactic vaccination against Streptococcus pneumoniae (pneumococcus), and Routine general medical examination at a health care facility were also pertinent to this visit.  History Cindy Oconnor has a past medical history of Arthritis; Asthma; Blood clot in vein; Chicken pox; Deafness in left ear; Down syndrome; RA (rheumatoid arthritis) ([place]); and Ulcerative colitis ([place]).   She has a past surgical history that includes Abdominal hysterectomy (1992) and Foot surgery.   Her family history includes Alcohol abuse in her maternal grandfather and maternal grandmother; Arthritis in her maternal grandfather and maternal grandmother; Diabetes in her father, maternal grandfather, maternal grandmother,  and mother; Heart disease in her father and mother; Stroke in her maternal grandfather and maternal grandmother.She reports that she has never smoked. She has never used  smokeless tobacco. She reports that she does not drink alcohol. Her drug history is not on file.  Outpatient Medications Prior to Visit  Medication Sig Dispense Refill  . alendronate (FOSAMAX) 70 MG tablet Take 1 tablet (70 mg total) by mouth once a week. Take with a full glass of water on an empty stomach. 4 tablet 3  . benzonatate (TESSALON) 100 MG capsule Take 100 mg by mouth 3 (three) times daily as needed for cough.     . budesonide-formoterol (SYMBICORT) 160-4.5 MCG/ACT inhaler Inhale 2 puffs into the lungs 2 (two) times daily. 1 Inhaler 3  . calcium carbonate (OSCAL) 1500 (600 Ca) MG TABS tablet Take 1 tablet (1,500 mg total) by mouth daily. 30 tablet 4  . carbamide peroxide (DEBROX) 6.5 % otic solution Place 5 drops into both ears as needed (to remove earwax).    . cholecalciferol (VITAMIN D) 1000 units tablet Take 1 tablet (1,000 Units total) by mouth daily. 30 tablet 5  . docusate sodium (COLACE) 100 MG capsule Take 1 capsule (100 mg total) by mouth every other day. 10 capsule 0  . folic acid (FOLVITE) 1 MG tablet TAKE ONE TABLET BY MOUTH ONCE A DAY. (FOLIC ACID SUPPLEMENT) 30 tablet 10  . gentamicin ointment (GARAMYCIN) 0.1 % Apply 1 application topically 3 (three) times daily as needed (for rash).     Marland Kitchen guaiFENesin (ROBITUSSIN) 100 MG/5ML SOLN Take 5 mLs (100 mg total) by mouth every 4 (four) hours as needed for cough or to loosen phlegm. 473 mL 0  . ketoconazole (NIZORAL) 2 % cream Apply 1 application topically daily. 60 g 2  . ketotifen (ZADITOR) 0.025 % ophthalmic solution Place 1 drop into both eyes 2 (two) times daily as needed.    . lactulose (CHRONULAC) 10 GM/15ML solution Take 30 g by mouth daily as needed for mild constipation.    Marland Kitchen levalbuterol (XOPENEX) 0.63 MG/3ML nebulizer solution Take 3 mLs (0.63 mg total) by nebulization 3 (three) times daily as needed for wheezing or shortness of breath. 3 mL 6  . levothyroxine (SYNTHROID, LEVOTHROID) 125 MCG tablet Take 1 tablet (125  mcg total) by mouth daily before breakfast. 6 days per week ONLY (NOT 7) 30 tablet 5  . loratadine (CLARITIN) 10 MG tablet Take 1 tablet (10 mg total) by mouth daily. 30 tablet 11  . methotrexate (RHEUMATREX) 2.5 MG tablet Take 7 tablets (17.5 mg total) by mouth once a week. Caution:Chemotherapy. Protect from light. 28 tablet 5  . montelukast (SINGULAIR) 10 MG tablet Take 1 tablet (10 mg total) by mouth at bedtime. 30 tablet 3  . Multiple Vitamins-Minerals (ONE-A-DAY VITACRAVES) CHEW Two chewables daily 70 tablet 6  . omeprazole (PRILOSEC) 20 MG capsule Take 1 capsule (20 mg total) by mouth 2 (two) times daily. 60 capsule 5  . ondansetron (ZOFRAN) 4 MG tablet Take 4 mg by mouth every 8 (eight) hours as needed for nausea or vomiting.     . predniSONE (DELTASONE) 2.5 MG tablet TAKE 1 TABLET BY MOUTH DAILY *STARTING AFTER TAPER COMPLETED* 30 tablet 0  . senna (SENEXON) 8.6 MG tablet Take 1 tablet (8.6 mg total) by mouth at bedtime. 30 tablet 3  . Tiotropium Bromide Monohydrate (SPIRIVA RESPIMAT) 1.25 MCG/ACT AERS Inhale 2.5 mcg into the lungs daily. Use two puffs daily 4 g 5  .  diclofenac sodium (VOLTAREN) 1 % GEL Apply 2-4 g topically 4 (four) times daily as needed (for pain). 100 g 0  . Multiple Vitamin (THEREMS) TABS Take 1 tablet by mouth daily.     Marland Kitchen loperamide (IMODIUM A-D) 2 MG tablet Take 1 tablet (2 mg total) by mouth 4 (four) times daily as needed for diarrhea or loose stools. 30 tablet 0  . potassium chloride 20 MEQ TBCR Take 10 mEq by mouth 2 (two) times daily. 4 tablet 0   No facility-administered medications prior to visit.     Review of Systems  Patient denies headache, fevers, malaise, unintentional weight loss, skin rash, eye pain, sinus congestion and sinus pain, sore throat, dysphagia,  hemoptysis , cough, dyspnea, wheezing, chest pain, palpitations, orthopnea, edema, abdominal pain, nausea, melena, diarrhea, constipation, flank pain, dysuria, hematuria, urinary  Frequency,  nocturia, numbness, tingling, seizures,  Focal weakness, Loss of consciousness,  Tremor, insomnia, depression, anxiety, and suicidal ideation.     Objective:  BP 118/80   Pulse 75   Temp 98 F (36.7 C) (Oral)   Resp 16   Ht 4' 11"  (1.499 m)   Wt 162 lb 6 oz (73.7 kg)   LMP 04/11/1981   SpO2 90%   BMI 32.80 kg/m   Physical Exam   General appearance: alert, cooperative and appears stated age Head: Normocephalic, without obvious abnormality, atraumatic Eyes: conjunctivae/corneas clear. PERRL, EOM's intact. Fundi benign. Ears: normal TM's and external ear canals both ears Nose: Nares normal. Septum midline. Mucosa normal. No drainage or sinus tenderness. Throat: lips, mucosa, and tongue normal; teeth and gums normal Neck: no adenopathy, no carotid bruit, no JVD, supple, symmetrical, trachea midline and thyroid not enlarged, symmetric, no tenderness/mass/nodules Lungs: clear to auscultation bilaterally Breasts: normal appearance, no masses or tenderness Heart: regular rate and rhythm, S1, S2 normal, no murmur, click, rub or gallop Abdomen: soft, non-tender; bowel sounds normal; no masses,  no organomegaly Extremities: extremities normal, atraumatic, no cyanosis or edema Pulses: 2+ and symmetric Skin: Skin color, texture, turgor normal. No rashes or lesions Neurologic: Alert and oriented X 3, normal strength and tone. Normal symmetric reflexes. Normal coordination and gait.      Assessment & Plan:   Problem List Items Addressed This Visit    Routine general medical examination at a health care facility    Annual comprehensive preventive exam was done as well as an evaluation and management of chronic conditions .  During the course of the visit the patient was educated and counseled about appropriate screening and preventive services including :  diabetes screening, lipid analysis with projected  10 year  risk for CAD , nutrition counseling, breast, cervical and colorectal cancer  screening, and recommended immunizations.  Printed recommendations for health maintenance screenings was given       Other Visit Diagnoses    Hypokalemia    -  Primary   Relevant Orders   Magnesium (Completed)   Comprehensive metabolic panel (Completed)   Macrocytosis without anemia       Relevant Orders   Folate RBC (Completed)   CBC with Differential/Platelet (Completed)   Fatigue, unspecified type       Relevant Orders   TSH (Completed)   B12 deficiency       Relevant Orders   B12 (Completed)   Need for prophylactic vaccination against Streptococcus pneumoniae (pneumococcus)       Relevant Orders   Pneumococcal polysaccharide vaccine 23-valent greater than or equal to 2yo subcutaneous/IM (Completed)  I have discontinued Ms. Pua's THEREMS, diclofenac sodium, loperamide, and Potassium Chloride ER. I am also having her start on AEROCHAMBER PLUS FLO-VU MEDIUM. Additionally, I am having her maintain her gentamicin ointment, ondansetron, carbamide peroxide, lactulose, Tiotropium Bromide Monohydrate, docusate sodium, benzonatate, guaiFENesin, montelukast, loratadine, alendronate, budesonide-formoterol, calcium carbonate, cholecalciferol, ketoconazole, levalbuterol, methotrexate, ONE-A-DAY VITACRAVES, omeprazole, levothyroxine, senna, folic acid, ketotifen, and predniSONE.  Meds ordered this encounter  Medications  . Spacer/Aero-Holding Chambers (AEROCHAMBER PLUS FLO-VU MEDIUM) MISC    Sig: 1 each by Other route once. Use twice daily  with symbicort MDI    Dispense:  1 each    Refill:  0    Medications Discontinued During This Encounter  Medication Reason  . potassium chloride 20 MEQ TBCR No longer needed (for PRN medications)  . diclofenac sodium (VOLTAREN) 1 % GEL Patient Discharge  . loperamide (IMODIUM A-D) 2 MG tablet No longer needed (for PRN medications)  . Multiple Vitamin (THEREMS) TABS Patient Discharge    Follow-up: No Follow-up on file.   Crecencio Mc,  MD

## 2016-06-08 ENCOUNTER — Telehealth: Payer: Self-pay | Admitting: *Deleted

## 2016-06-08 LAB — COMPREHENSIVE METABOLIC PANEL
ALT: 27 U/L (ref 0–35)
AST: 31 U/L (ref 0–37)
Albumin: 3.9 g/dL (ref 3.5–5.2)
Alkaline Phosphatase: 73 U/L (ref 39–117)
BILIRUBIN TOTAL: 0.3 mg/dL (ref 0.2–1.2)
BUN: 14 mg/dL (ref 6–23)
CHLORIDE: 105 meq/L (ref 96–112)
CO2: 32 meq/L (ref 19–32)
Calcium: 9.3 mg/dL (ref 8.4–10.5)
Creatinine, Ser: 0.86 mg/dL (ref 0.40–1.20)
GFR: 74.92 mL/min (ref >60.00)
GLUCOSE: 112 mg/dL — AB (ref 70–99)
Potassium: 4.8 mEq/L (ref 3.5–5.1)
Sodium: 145 mEq/L (ref 135–145)
Total Protein: 6.7 g/dL (ref 6.0–8.3)

## 2016-06-08 LAB — CBC WITH DIFFERENTIAL/PLATELET
BASOS ABS: 0 10*3/uL (ref 0.0–0.1)
Basophils Relative: 0.4 % (ref 0.0–3.0)
EOS PCT: 1.4 % (ref 0.0–5.0)
Eosinophils Absolute: 0.1 10*3/uL (ref 0.0–0.7)
HCT: 39.5 % (ref 36.0–46.0)
Hemoglobin: 13.4 g/dL (ref 12.0–15.0)
LYMPHS ABS: 1.1 10*3/uL (ref 0.7–4.0)
Lymphocytes Relative: 20.1 % (ref 12.0–46.0)
MCHC: 34 g/dL (ref 30.0–36.0)
MCV: 102.7 fl — ABNORMAL HIGH (ref 78.0–100.0)
MONO ABS: 0.2 10*3/uL (ref 0.1–1.0)
Monocytes Relative: 3.7 % (ref 3.0–12.0)
NEUTROS PCT: 74.4 % (ref 43.0–77.0)
Neutro Abs: 4.2 10*3/uL (ref 1.4–7.7)
PLATELETS: 386 10*3/uL (ref 150.0–400.0)
RBC: 3.85 Mil/uL — AB (ref 3.87–5.11)
RDW: 17.2 % — ABNORMAL HIGH (ref 11.5–15.5)
WBC: 5.6 10*3/uL (ref 4.0–10.5)

## 2016-06-08 LAB — TSH: TSH: 4.4 u[IU]/mL (ref 0.35–4.50)

## 2016-06-08 LAB — VITAMIN B12: VITAMIN B 12: 300 pg/mL (ref 211–911)

## 2016-06-08 LAB — FOLATE RBC: RBC Folate: 767 ng/mL (ref >280)

## 2016-06-08 LAB — MAGNESIUM: MAGNESIUM: 2.4 mg/dL (ref 1.5–2.5)

## 2016-06-08 NOTE — Telephone Encounter (Signed)
Pt had labs drawn yesterday afternoon. Per moms request, please fax results to [facility] at: [phone].

## 2016-06-08 NOTE — Assessment & Plan Note (Signed)
Annual comprehensive preventive exam was done as well as an evaluation and management of chronic conditions .  During the course of the visit the patient was educated and counseled about appropriate screening and preventive services including :  diabetes screening, lipid analysis with projected  10 year  risk for CAD , nutrition counseling, breast, cervical and colorectal cancer screening, and recommended immunizations.  Printed recommendations for health maintenance screenings was given 

## 2016-06-10 NOTE — Telephone Encounter (Signed)
Results sent to [place]  At [phone]

## 2016-06-23 ENCOUNTER — Encounter: Payer: Medicare Other | Admitting: Internal Medicine

## 2016-07-01 ENCOUNTER — Ambulatory Visit: Payer: Medicare Other | Admitting: Podiatry

## 2016-07-05 ENCOUNTER — Ambulatory Visit (INDEPENDENT_AMBULATORY_CARE_PROVIDER_SITE_OTHER): Payer: Medicare Other | Admitting: Podiatry

## 2016-07-05 ENCOUNTER — Encounter: Payer: Self-pay | Admitting: Podiatry

## 2016-07-05 DIAGNOSIS — B351 Tinea unguium: Secondary | ICD-10-CM

## 2016-07-05 DIAGNOSIS — L603 Nail dystrophy: Secondary | ICD-10-CM

## 2016-07-05 NOTE — Progress Notes (Signed)
Complaint:  Visit Type: Patient returns to my office for continued preventative foot care services. Complaint: Patient states" my nails have grown long and thick and become painful to walk and wear shoes"Patient presents for preventative foot care services. No changes to ROS  Podiatric Exam: Vascular: dorsalis pedis and posterior tibial pulses are palpable bilateral. Capillary return is immediate. Temperature gradient is WNL. Skin turgor WNL  Sensorium: Normal Semmes Weinstein monofilament test. Normal tactile sensation bilaterally. Nail Exam: Pt has thick disfigured discolored nails with subungual debris noted bilateral entire nail hallux through fifth toenails Ulcer Exam: There is no evidence of ulcer or pre-ulcerative changes or infection. Orthopedic Exam: Muscle tone and strength are WNL. No limitations in general ROM. No crepitus or effusions noted. Foot type and digits show no abnormalities. DJD 1st MPJ right foot.Marland Kitchen  HAV 1st MPJ left foot.Zena Amos bunion 5th left foot. Skin: No Porokeratosis. No infection or ulcers  Diagnosis:  Onychomycosis, , Pain in right toe, pain in left toes  Treatment & Plan Procedures and Treatment: Consent by patient was obtained for treatment procedures. The patient understood the discussion of treatment and procedures well. All questions were answered thoroughly reviewed. Debridement of mycotic and hypertrophic toenails, 1 through 5 bilateral and clearing of subungual debris. No ulceration, no infection noted.  Return Visit-Office Procedure: Patient instructed to return to the office for a follow up visit 3 months for continued evaluation and treatment.    Gardiner Barefoot DPM

## 2016-07-09 ENCOUNTER — Other Ambulatory Visit: Payer: Self-pay | Admitting: Internal Medicine

## 2016-07-09 DIAGNOSIS — Z1231 Encounter for screening mammogram for malignant neoplasm of breast: Secondary | ICD-10-CM

## 2016-08-12 ENCOUNTER — Other Ambulatory Visit: Payer: Self-pay | Admitting: Internal Medicine

## 2016-08-19 ENCOUNTER — Ambulatory Visit
Admission: RE | Admit: 2016-08-19 | Discharge: 2016-08-19 | Disposition: A | Discharge status: Home / Self Care | Payer: Medicare Other | Source: Ambulatory Visit | Attending: Internal Medicine | Admitting: Internal Medicine

## 2016-08-19 DIAGNOSIS — Z1231 Encounter for screening mammogram for malignant neoplasm of breast: Secondary | ICD-10-CM | POA: Insufficient documentation

## 2016-08-24 ENCOUNTER — Telehealth: Payer: Self-pay | Admitting: Internal Medicine

## 2016-08-24 DIAGNOSIS — L603 Nail dystrophy: Secondary | ICD-10-CM

## 2016-08-24 NOTE — Telephone Encounter (Signed)
LMTCB with [place]. Need to let her know that Dr. Derrel Nip has placed the order for the referral.

## 2016-08-24 NOTE — Telephone Encounter (Signed)
Please advise 

## 2016-08-24 NOTE — Telephone Encounter (Signed)
Cindy Oconnor's appointment manager at [employer] states that Cindy Oconnor needs a referral to see Dr. Cleda Oconnor at [facility] to get her nails trimmed. [place] cb # [phone]

## 2016-08-24 NOTE — Telephone Encounter (Signed)
done

## 2016-09-14 ENCOUNTER — Encounter: Payer: Self-pay | Admitting: Family Medicine

## 2016-09-14 ENCOUNTER — Telehealth: Payer: Self-pay

## 2016-09-14 ENCOUNTER — Ambulatory Visit (INDEPENDENT_AMBULATORY_CARE_PROVIDER_SITE_OTHER): Payer: Medicare Other | Admitting: Family Medicine

## 2016-09-14 ENCOUNTER — Ambulatory Visit (INDEPENDENT_AMBULATORY_CARE_PROVIDER_SITE_OTHER): Payer: Medicare Other

## 2016-09-14 VITALS — BP 132/82 | HR 65 | Temp 98.5°F | Wt 165.0 lb

## 2016-09-14 DIAGNOSIS — J4541 Moderate persistent asthma with (acute) exacerbation: Secondary | ICD-10-CM | POA: Diagnosis not present

## 2016-09-14 DIAGNOSIS — J45901 Unspecified asthma with (acute) exacerbation: Secondary | ICD-10-CM | POA: Diagnosis not present

## 2016-09-14 MED ORDER — TIOTROPIUM BROMIDE MONOHYDRATE 18 MCG IN CAPS
18.0000 ug | ORAL_CAPSULE | Freq: Every day | RESPIRATORY_TRACT | 1 refills | Status: DC
Start: 2016-09-14 — End: 2016-09-28

## 2016-09-14 MED ORDER — PREDNISONE 10 MG PO TABS: ORAL_TABLET | ORAL | 0 refills | Status: DC

## 2016-09-14 NOTE — Assessment & Plan Note (Signed)
Acute asthma exacerbation. Given history and known immunosuppression, obtaining chest x-ray. Treating with prednisone. Advised use of Xopenex every 8 hours over the next few days.

## 2016-09-14 NOTE — Patient Instructions (Signed)
Xopenex every 6-8 hours.  Prednisone as prescribed.  We will call with the xray results.  Take care  Dr. Lacinda Axon

## 2016-09-14 NOTE — Telephone Encounter (Signed)
Yes,  90 day rx sent

## 2016-09-14 NOTE — Telephone Encounter (Signed)
Received a fax from pt's pharmacy stating that her insurance will not cover the spiriva respimat. Would it be ok to change it to the regular sprivia inhaler?

## 2016-09-14 NOTE — Progress Notes (Signed)
Pre visit review using our clinic review tool, if applicable. No additional management support is needed unless otherwise documented below in the visit note. 

## 2016-09-14 NOTE — Progress Notes (Signed)
Subjective:  Patient ID: Cindy Oconnor, female    DOB: 01/25/1971  Age: 46 y.o. MRN: [id number]  CC: Cough  HPI:  46 year old female with Down syndrome, RA, asthma presents with the above complaint.  History is from the mother predominantly given Down syndrome.  Symptoms started around Thursday or Friday. Severe cough and associated wheezing. She's been having shortness of breath as well. Associated fatigue. No documented fever. Questionable subjective fever. She is not using her rescue levalbuterol. Has long-standing history of asthma. No other associated symptoms. No other complaints or concerns at this time.  Social Hx   Social History   Social History  . Marital status: Single    Spouse name: N/A  . Number of children: N/A  . Years of education: N/A   Social History Main Topics  . Smoking status: Never Smoker  . Smokeless tobacco: Never Used  . Alcohol use No  . Drug use: Unknown  . Sexual activity: Not Asked   Other Topics Concern  . None   Social History Narrative   Lives at [facility] group home.    Review of Systems  Constitutional: Positive for fatigue.  Respiratory: Positive for cough and shortness of breath.    Objective:  BP 132/82   Pulse 65   Temp 98.5 F (36.9 C) (Oral)   Wt 165 lb (74.8 kg)   LMP 04/11/1981   SpO2 98%   BMI 33.33 kg/m   BP/Weight 09/14/2016 06/07/2016 57/84/6962  Systolic BP 952 841 90  Diastolic BP 82 80 50  Wt. (Lbs) 165 162.38 -  BMI 33.33 32.8 -    Physical Exam  Constitutional:  Chronically ill-appearing obese female with Down syndrome.  HENT:  Head: Normocephalic and atraumatic.  Cardiovascular: Normal rate and regular rhythm.   Pulmonary/Chest:  Mild increased work of breathing. Diffuse expiratory wheezing.  Neurological: She is alert.  Vitals reviewed.  Lab Results  Component Value Date   WBC 5.6 06/07/2016   HGB 13.4 06/07/2016   HCT 39.5 06/07/2016   PLT 386.0 06/07/2016   GLUCOSE  112 (H) 06/07/2016   CHOL 174 05/19/2015   TRIG 97.0 05/19/2015   HDL 47.50 05/19/2015   LDLCALC 107 (H) 05/19/2015   ALT 27 06/07/2016   AST 31 06/07/2016   NA 145 06/07/2016   K 4.8 06/07/2016   CL 105 06/07/2016   CREATININE 0.86 06/07/2016   BUN 14 06/07/2016   CO2 32 06/07/2016   TSH 4.40 06/07/2016   HGBA1C 5.7 06/27/2013    Assessment & Plan:   Problem List Items Addressed This Visit    Moderate persistent asthma with acute exacerbation - Primary    Acute asthma exacerbation. Given history and known immunosuppression, obtaining chest x-ray. Treating with prednisone. Advised use of Xopenex every 8 hours over the next few days.      Relevant Medications   predniSONE (DELTASONE) 10 MG tablet   Other Relevant Orders   DG Chest 2 View     Meds ordered this encounter  Medications  . predniSONE (DELTASONE) 10 MG tablet    Sig: 50 mg (5 tablets) daily x 2 days, then 40 mg (4 tablets) daily x 2 days, then 30 mg (3 tablets) daily x 2 days, then 20 mg (2 tablets) daily x 2 days, then 10 mg (1 tablet) daily x 2 days, then 5 mg (1/2 tablet) x 2 days then resume daily 2.5 mg daily.    Dispense:  31 tablet  Refill:  0   Follow-up: PRN  [place]

## 2016-09-16 ENCOUNTER — Ambulatory Visit (INDEPENDENT_AMBULATORY_CARE_PROVIDER_SITE_OTHER): Payer: Medicare Other | Admitting: Podiatry

## 2016-09-16 ENCOUNTER — Encounter: Payer: Self-pay | Admitting: Podiatry

## 2016-09-16 DIAGNOSIS — B351 Tinea unguium: Secondary | ICD-10-CM | POA: Diagnosis not present

## 2016-09-16 DIAGNOSIS — M79672 Pain in left foot: Secondary | ICD-10-CM

## 2016-09-16 DIAGNOSIS — M79671 Pain in right foot: Secondary | ICD-10-CM

## 2016-09-16 NOTE — Progress Notes (Signed)
Complaint:  Visit Type: Patient returns to my office for continued preventative foot care services. Complaint: Patient states" my nails have grown long and thick and become painful to walk and wear shoes"Patient presents for preventative foot care services. No changes to ROS  Podiatric Exam: Vascular: dorsalis pedis and posterior tibial pulses are palpable bilateral. Capillary return is immediate. Temperature gradient is WNL. Skin turgor WNL  Sensorium: Normal Semmes Weinstein monofilament test. Normal tactile sensation bilaterally. Nail Exam: Pt has thick disfigured discolored nails with subungual debris noted bilateral entire nail hallux through fifth toenails Ulcer Exam: There is no evidence of ulcer or pre-ulcerative changes or infection. Orthopedic Exam: Muscle tone and strength are WNL. No limitations in general ROM. No crepitus or effusions noted. Foot type and digits show no abnormalities. DJD 1st MPJ right foot.Marland Kitchen  HAV 1st MPJ left foot.Zena Amos bunion 5th left foot. Skin: No Porokeratosis. No infection or ulcers.  Rash right foot.  Diagnosis:  Onychomycosis, , Pain in right toe, pain in left toes  Treatment & Plan Procedures and Treatment: Consent by patient was obtained for treatment procedures. The patient understood the discussion of treatment and procedures well. All questions were answered thoroughly reviewed. Debridement of mycotic and hypertrophic toenails, 1 through 5 bilateral and clearing of subungual debris. No ulceration, no infection noted.  Return Visit-Office Procedure: Patient instructed to return to the office for a follow up visit 3 months for continued evaluation and treatment.    Gardiner Barefoot DPM

## 2016-09-21 DIAGNOSIS — M79641 Pain in right hand: Secondary | ICD-10-CM | POA: Diagnosis not present

## 2016-09-21 DIAGNOSIS — M5186 Other intervertebral disc disorders, lumbar region: Secondary | ICD-10-CM | POA: Diagnosis not present

## 2016-09-21 DIAGNOSIS — M170 Bilateral primary osteoarthritis of knee: Secondary | ICD-10-CM | POA: Diagnosis not present

## 2016-09-21 DIAGNOSIS — M25539 Pain in unspecified wrist: Secondary | ICD-10-CM | POA: Diagnosis not present

## 2016-09-21 DIAGNOSIS — Z79899 Other long term (current) drug therapy: Secondary | ICD-10-CM | POA: Diagnosis not present

## 2016-09-21 DIAGNOSIS — M810 Age-related osteoporosis without current pathological fracture: Secondary | ICD-10-CM | POA: Diagnosis not present

## 2016-09-21 DIAGNOSIS — E039 Hypothyroidism, unspecified: Secondary | ICD-10-CM | POA: Diagnosis not present

## 2016-09-21 DIAGNOSIS — M2540 Effusion, unspecified joint: Secondary | ICD-10-CM | POA: Diagnosis not present

## 2016-09-21 DIAGNOSIS — J45909 Unspecified asthma, uncomplicated: Secondary | ICD-10-CM | POA: Diagnosis not present

## 2016-09-21 DIAGNOSIS — Z7952 Long term (current) use of systemic steroids: Secondary | ICD-10-CM | POA: Diagnosis not present

## 2016-09-21 DIAGNOSIS — K5190 Ulcerative colitis, unspecified, without complications: Secondary | ICD-10-CM | POA: Diagnosis not present

## 2016-09-21 DIAGNOSIS — M79642 Pain in left hand: Secondary | ICD-10-CM | POA: Diagnosis not present

## 2016-09-21 DIAGNOSIS — Z791 Long term (current) use of non-steroidal anti-inflammatories (NSAID): Secondary | ICD-10-CM | POA: Diagnosis not present

## 2016-09-21 DIAGNOSIS — Q909 Down syndrome, unspecified: Secondary | ICD-10-CM | POA: Diagnosis not present

## 2016-09-21 DIAGNOSIS — Z7951 Long term (current) use of inhaled steroids: Secondary | ICD-10-CM | POA: Diagnosis not present

## 2016-09-21 DIAGNOSIS — M069 Rheumatoid arthritis, unspecified: Secondary | ICD-10-CM | POA: Diagnosis not present

## 2016-09-21 DIAGNOSIS — M064 Inflammatory polyarthropathy: Secondary | ICD-10-CM | POA: Diagnosis not present

## 2016-09-21 DIAGNOSIS — M79673 Pain in unspecified foot: Secondary | ICD-10-CM | POA: Diagnosis not present

## 2016-09-21 DIAGNOSIS — M1712 Unilateral primary osteoarthritis, left knee: Secondary | ICD-10-CM | POA: Diagnosis not present

## 2016-09-21 DIAGNOSIS — M25511 Pain in right shoulder: Secondary | ICD-10-CM | POA: Diagnosis not present

## 2016-09-21 DIAGNOSIS — Z86718 Personal history of other venous thrombosis and embolism: Secondary | ICD-10-CM | POA: Diagnosis not present

## 2016-09-21 DIAGNOSIS — M438X9 Other specified deforming dorsopathies, site unspecified: Secondary | ICD-10-CM | POA: Diagnosis not present

## 2016-09-21 DIAGNOSIS — M25641 Stiffness of right hand, not elsewhere classified: Secondary | ICD-10-CM | POA: Diagnosis not present

## 2016-09-21 DIAGNOSIS — Z7983 Long term (current) use of bisphosphonates: Secondary | ICD-10-CM | POA: Diagnosis not present

## 2016-09-24 ENCOUNTER — Telehealth: Payer: Self-pay

## 2016-09-24 NOTE — Telephone Encounter (Signed)
Received a fax from pt's insurance stating that the [place] was approved from 06/26/2016-09/24/2017. Spoke with pt's mother to let her know that it was approved. Pt does not have anything for pain. Also have spoke with pt's pharmacy to let them know as well.

## 2016-09-27 ENCOUNTER — Telehealth: Payer: Self-pay | Admitting: *Deleted

## 2016-09-27 DIAGNOSIS — M069 Rheumatoid arthritis, unspecified: Secondary | ICD-10-CM | POA: Diagnosis not present

## 2016-09-27 NOTE — Telephone Encounter (Signed)
Pharmacy care services requested a order to have spirava handihaler discontinued, this is not covered by insurance.  Contact [phone]

## 2016-09-28 MED ORDER — TIOTROPIUM BROMIDE MONOHYDRATE 1.25 MCG/ACT IN AERS: 2.0000 | INHALATION_SPRAY | Freq: Every day | RESPIRATORY_TRACT | 11 refills | Status: DC

## 2016-09-28 NOTE — Telephone Encounter (Signed)
Yes, but will  replace it with other spiriva formula.  (respimat)

## 2016-09-28 NOTE — Telephone Encounter (Signed)
Spoke with pharmacy and d/c'd the medication and let them know that it was switched to the spiriva respimat.

## 2016-09-28 NOTE — Telephone Encounter (Signed)
Is it ok to d/c this?

## 2016-10-01 ENCOUNTER — Telehealth: Payer: Self-pay

## 2016-10-01 NOTE — Telephone Encounter (Signed)
NO,  SHE JUST NEEDS TO CONTINUE SYMBICORT, SINGULAIR AND CLARITIN

## 2016-10-01 NOTE — Telephone Encounter (Signed)
Spoke with pharmacy and gave the verbal ok to d/c both spiriva inhalers. Lady at the pharmacy wanted to if the pt needed a different inhaler in place of those.

## 2016-10-01 NOTE — Telephone Encounter (Signed)
Received a fax from the pt's pharmacy stating that the pt's insurance will not pay for either sprivia handihaler or the spiriva respimat. They need an order stating that it is ok to d/c both of the inhalers and a order for a different inhaler. Is it ok to give them a verbal to d/c those inhalers?

## 2016-10-01 NOTE — Telephone Encounter (Signed)
Yes ok to dc

## 2016-10-05 NOTE — Telephone Encounter (Signed)
Pharmacy was notified.

## 2016-10-07 DIAGNOSIS — H903 Sensorineural hearing loss, bilateral: Secondary | ICD-10-CM | POA: Diagnosis not present

## 2016-10-07 DIAGNOSIS — H6123 Impacted cerumen, bilateral: Secondary | ICD-10-CM | POA: Diagnosis not present

## 2016-10-13 DIAGNOSIS — M170 Bilateral primary osteoarthritis of knee: Secondary | ICD-10-CM | POA: Diagnosis not present

## 2016-10-13 DIAGNOSIS — M1712 Unilateral primary osteoarthritis, left knee: Secondary | ICD-10-CM | POA: Diagnosis not present

## 2016-10-24 ENCOUNTER — Encounter: Payer: Self-pay | Admitting: Emergency Medicine

## 2016-10-24 ENCOUNTER — Emergency Department: Payer: Medicare Other

## 2016-10-24 ENCOUNTER — Emergency Department
Admission: EM | Admit: 2016-10-24 | Discharge: 2016-10-24 | Disposition: A | Discharge status: Home / Self Care | Payer: Medicare Other | Attending: Emergency Medicine | Admitting: Emergency Medicine

## 2016-10-24 DIAGNOSIS — E039 Hypothyroidism, unspecified: Secondary | ICD-10-CM | POA: Insufficient documentation

## 2016-10-24 DIAGNOSIS — Q909 Down syndrome, unspecified: Secondary | ICD-10-CM | POA: Insufficient documentation

## 2016-10-24 DIAGNOSIS — M7062 Trochanteric bursitis, left hip: Secondary | ICD-10-CM | POA: Insufficient documentation

## 2016-10-24 DIAGNOSIS — Z79899 Other long term (current) drug therapy: Secondary | ICD-10-CM | POA: Diagnosis not present

## 2016-10-24 DIAGNOSIS — M25552 Pain in left hip: Secondary | ICD-10-CM | POA: Diagnosis not present

## 2016-10-24 DIAGNOSIS — Y939 Activity, unspecified: Secondary | ICD-10-CM | POA: Diagnosis not present

## 2016-10-24 DIAGNOSIS — J4541 Moderate persistent asthma with (acute) exacerbation: Secondary | ICD-10-CM | POA: Diagnosis not present

## 2016-10-24 MED ORDER — MELOXICAM 7.5 MG PO TABS: 7.5000 mg | ORAL_TABLET | Freq: Every day | ORAL | 0 refills | Status: DC

## 2016-10-24 MED ORDER — KETOROLAC TROMETHAMINE 30 MG/ML IJ SOLN
30.0000 mg | Freq: Once | INTRAMUSCULAR | Status: AC
  Administered 2016-10-24: 30 mg via INTRAMUSCULAR
  Filled 2016-10-24: qty 1

## 2016-10-24 NOTE — ED Notes (Signed)
See triage note  Per mom she has a hx of arthritis   But developed increased pain to left hip area this am  Mom states she lives in a group home so not sure how long it has hurt her

## 2016-10-24 NOTE — ED Triage Notes (Signed)
Mother unsure how long left hip hurting since pt lives in group home. Pt states pain 10/10

## 2016-10-24 NOTE — ED Provider Notes (Signed)
[facility] Provider Note   CSN: [id number] Arrival date & time: 10/24/16  1144     History   Chief Complaint Chief Complaint  Patient presents with  . Hip Pain    HPI Cindy Oconnor is a 46 y.o. female presents to the emergency department for evaluation of left hip pain. Pain has been present for 1 week. Pain is moderate. No trauma or injury. She points to the left hip trochanteric bursa. No back pain numbness tingling or radicular symptoms. She is not taking any medications for pain. She is ambulatory with no assistive devices. Her pain is increased with lying on the left hip at nighttime as well as with ambulation.    Marland KitchenHPI  Past Medical History:  Diagnosis Date  . Arthritis   . Asthma   . Blood clot in vein   . Chicken pox   . Deafness in left ear   . Down syndrome   . RA (rheumatoid arthritis) ([place])   . Ulcerative colitis [facility])     Patient Active Problem List   Diagnosis Date Noted  . Moderate persistent asthma with acute exacerbation 09/14/2016  . Chronic ulcerative colitis ([place]) 09/26/2015  . Dysphagia 08/26/2015  . Osteoarthritis 01/30/2014  . Osteoporosis 01/30/2014  . Mild tricuspid regurgitation 02/13/2013  . Routine general medical examination at a health care facility 01/24/2013  . Obesity (BMI 30-39.9) 01/21/2012  . Recurrent boils 03/12/2009  . Rheumatoid arthritis ([place]) 06/11/2006  . HYSTERECTOMY, HX OF 06/11/2006  . Hypothyroid 03/18/2006  . Asthma, chronic 03/18/2006  . GERD 03/18/2006  . Down's syndrome 03/18/2006    Past Surgical History:  Procedure Laterality Date  . ABDOMINAL HYSTERECTOMY  1992   partial  . FOOT SURGERY     x2    OB History    No data available       Home Medications    Prior to Admission medications   Medication Sig Start Date End Date Taking? Authorizing Provider  alendronate (FOSAMAX) 70 MG tablet Take 1 tablet (70 mg total) by mouth once a week. Take with a full glass of water on an  empty stomach. 03/03/16   Crecencio Mc, MD  budesonide-formoterol (SYMBICORT) 160-4.5 MCG/ACT inhaler Inhale 2 puffs into the lungs 2 (two) times daily. 03/03/16   Crecencio Mc, MD  calcium carbonate (OSCAL) 1500 (600 Ca) MG TABS tablet Take 1 tablet (1,500 mg total) by mouth daily. 03/03/16   Crecencio Mc, MD  carbamide peroxide (DEBROX) 6.5 % otic solution Place 5 drops into both ears as needed (to remove earwax).    [provider]  cholecalciferol (VITAMIN D) 1000 units tablet Take 1 tablet (1,000 Units total) by mouth daily. 03/03/16   Crecencio Mc, MD  folic acid (FOLVITE) 1 MG tablet TAKE ONE TABLET BY MOUTH ONCE A DAY. (FOLIC ACID SUPPLEMENT) 04/19/16   Crecencio Mc, MD  ketoconazole (NIZORAL) 2 % cream Apply 1 application topically daily. 03/03/16   Crecencio Mc, MD  ketotifen (ZADITOR) 0.025 % ophthalmic solution Place 1 drop into both eyes 2 (two) times daily as needed.    [provider]  lactulose (CHRONULAC) 10 GM/15ML solution Take 30 g by mouth daily as needed for mild constipation.    [provider]  levalbuterol Penne Lash) 0.63 MG/3ML nebulizer solution Take 3 mLs (0.63 mg total) by nebulization 3 (three) times daily as needed for wheezing or shortness of breath. 03/03/16   Crecencio Mc, MD  levothyroxine (SYNTHROID,  LEVOTHROID) 125 MCG tablet Take 1 tablet (125 mcg total) by mouth daily before breakfast. 6 days per week ONLY (NOT 7) 03/04/16   Crecencio Mc, MD  loratadine (CLARITIN) 10 MG tablet Take 1 tablet (10 mg total) by mouth daily. 03/03/16   Crecencio Mc, MD  meloxicam (MOBIC) 7.5 MG tablet Take 1 tablet (7.5 mg total) by mouth daily. 10/24/16 10/24/17  Duanne Guess, PA-C  methotrexate (RHEUMATREX) 2.5 MG tablet Take 7 tablets (17.5 mg total) by mouth once a week. Caution:Chemotherapy. Protect from light. 03/03/16   Crecencio Mc, MD  montelukast (SINGULAIR) 10 MG tablet Take 1 tablet (10 mg total) by mouth at bedtime. 03/03/16    Crecencio Mc, MD  Multiple Vitamins-Minerals (ONE-A-DAY VITACRAVES) CHEW Two chewables daily 03/03/16   Crecencio Mc, MD  omeprazole (PRILOSEC) 20 MG capsule Take 1 capsule (20 mg total) by mouth 2 (two) times daily. 03/03/16   Crecencio Mc, MD  ondansetron (ZOFRAN) 4 MG tablet Take 4 mg by mouth every 8 (eight) hours as needed for nausea or vomiting.     [provider]  predniSONE (DELTASONE) 10 MG tablet 50 mg (5 tablets) daily x 2 days, then 40 mg (4 tablets) daily x 2 days, then 30 mg (3 tablets) daily x 2 days, then 20 mg (2 tablets) daily x 2 days, then 10 mg (1 tablet) daily x 2 days, then 5 mg (1/2 tablet) x 2 days then resume daily 2.5 mg daily. 09/14/16   Cook, Barnie Del, DO  predniSONE (DELTASONE) 2.5 MG tablet TAKE 1 TABLET BY MOUTH DAILY *STARTING AFTER TAPER COMPLETED* 05/25/16   Crecencio Mc, MD  senna (SENEXON) 8.6 MG tablet Take 1 tablet (8.6 mg total) by mouth at bedtime. 04/01/16   Crecencio Mc, MD  STOOL SOFTENER 100 MG capsule TAKE 1 CAPSULE BY MOUTH EVERY OTHER DAY 08/12/16   Crecencio Mc, MD    Family History Family History  Problem Relation Age of Onset  . Heart disease Mother   . Diabetes Mother   . Heart disease Father   . Diabetes Father   . Alcohol abuse Maternal Grandmother   . Arthritis Maternal Grandmother   . Stroke Maternal Grandmother   . Diabetes Maternal Grandmother   . Alcohol abuse Maternal Grandfather   . Arthritis Maternal Grandfather   . Stroke Maternal Grandfather   . Diabetes Maternal Grandfather     Social History Social History  Substance Use Topics  . Smoking status: Never Smoker  . Smokeless tobacco: Never Used  . Alcohol use No     Allergies   Erythromycin and Albuterol   Review of Systems Review of Systems  Constitutional: Negative for fever.  Musculoskeletal: Positive for arthralgias and myalgias. Negative for back pain, gait problem and joint swelling.  Skin: Negative for rash and wound.    Psychiatric/Behavioral: Negative for agitation, behavioral problems and confusion.     Physical Exam Updated Vital Signs BP 107/66 (BP Location: Left Arm)   Pulse 74   Temp 98.2 F (36.8 C) (Oral)   LMP 04/11/1981   SpO2 94%   Physical Exam  Constitutional: She is oriented to person, place, and time. She appears well-developed and well-nourished.  HENT:  Head: Normocephalic and atraumatic.  Neck: Normal range of motion.  Cardiovascular: Normal rate.   Pulmonary/Chest: Effort normal. No respiratory distress.  Musculoskeletal:  Examination of the left hip shows patient has good internal and external rotation with no stiffness  or pain with range of motion. No swelling. She is tender along the left hip trochanteric bursa. Nontender along the lower lumbar spine, spinous processes or paravertebral muscle. She is full range of motion of the knee and ankle. No neurological deficits noted.  Neurological: She is alert and oriented to person, place, and time.  Skin: Skin is warm. No erythema.  Psychiatric: She has a normal mood and affect.     ED Treatments / Results  Labs (all labs ordered are listed, but only abnormal results are displayed) Labs Reviewed - No data to display  EKG  EKG Interpretation None       Radiology Dg Hip Unilat With Pelvis 2-3 Views Left  Result Date: 10/24/2016 CLINICAL DATA:  Increasing pain in left hip. EXAM: DG HIP (WITH OR WITHOUT PELVIS) 2-3V LEFT COMPARISON:  None. FINDINGS: There is no evidence of hip fracture or dislocation. There is no evidence of arthropathy or other focal bone abnormality. IMPRESSION: Negative. Electronically Signed   By: Dorise Bullion III M.D   On: 10/24/2016 12:57    Procedures Procedures (including critical care time)  Medications Ordered in ED Medications  ketorolac (TORADOL) 30 MG/ML injection 30 mg (30 mg Intramuscular Given 10/24/16 1256)     Initial Impression / Assessment and Plan / ED Course  I have  reviewed the triage vital signs and the nursing notes.  Pertinent labs & imaging results that were available during my care of the patient were reviewed by me and considered in my medical decision making (see chart for details).    46 year old female diagnosed with left hip trochanteric bursitis. She is given 30 mg of Toradol IM, saw improvement with her left hip pain. Patient is discharged with meloxicam 7.5 mg daily with food for 2 weeks. She will follow-up with PCP or orthopedic provider in 2 weeks for repeat evaluation. She is educated on signs and symptoms return to the ED for. Final Clinical Impressions(s) / ED Diagnoses   Final diagnoses:  Left hip pain  Trochanteric bursitis, left hip    New Prescriptions New Prescriptions   MELOXICAM (MOBIC) 7.5 MG TABLET    Take 1 tablet (7.5 mg total) by mouth daily.     Duanne Guess, PA-C 10/24/16 1344    Nance Pear, MD 10/24/16 [phone]

## 2016-10-24 NOTE — ED Triage Notes (Signed)
Lives at group home.  Uses walker to ambulate.  Has known left knee DJD that will probably need replacement (has appointment with Dr. Lake Bells in June).  C/O Left hip pain x approximately 1 week.

## 2016-10-24 NOTE — Discharge Instructions (Signed)
Please avoid lying on the left hip at nighttime, apply heating pad to the left hip. Taken meloxicam 7.5 mg daily. Follow-up with orthopedics if no improvement in 7-10 days.

## 2016-11-12 ENCOUNTER — Telehealth: Payer: Self-pay | Admitting: Internal Medicine

## 2016-11-12 NOTE — Telephone Encounter (Signed)
Please advise, this is a current medication on her med list.  Last OV was in April, please advise, thanks

## 2016-11-12 NOTE — Telephone Encounter (Signed)
Deannette from [facility] called and needs a dc on pt's mobic. Please advise, thank you!  Fax # [phone]

## 2016-11-15 ENCOUNTER — Other Ambulatory Visit: Payer: Self-pay

## 2016-11-15 MED ORDER — MELOXICAM 7.5 MG PO TABS: 7.5000 mg | ORAL_TABLET | Freq: Every day | ORAL | 0 refills | Status: DC

## 2016-11-15 NOTE — Telephone Encounter (Signed)
This is a medication for joint pain ,  Why are they stopping it? Because she is having surgery?  Ok to give verbal to stop if so.

## 2016-11-15 NOTE — Telephone Encounter (Signed)
Called and spoke with facility, they didn't want to stop it they wanted a refill on it, the person that called is no longer an employee there, so I spoke with a new employee.  I have resent the rx, thanks

## 2016-11-19 ENCOUNTER — Other Ambulatory Visit: Payer: Self-pay | Admitting: Internal Medicine

## 2016-11-22 ENCOUNTER — Ambulatory Visit: Payer: Medicare Other | Admitting: Podiatry

## 2016-11-22 DIAGNOSIS — M79674 Pain in right toe(s): Secondary | ICD-10-CM | POA: Diagnosis not present

## 2016-11-22 DIAGNOSIS — B351 Tinea unguium: Secondary | ICD-10-CM | POA: Diagnosis not present

## 2016-11-22 DIAGNOSIS — M2042 Other hammer toe(s) (acquired), left foot: Secondary | ICD-10-CM | POA: Diagnosis not present

## 2016-11-22 DIAGNOSIS — M2041 Other hammer toe(s) (acquired), right foot: Secondary | ICD-10-CM | POA: Diagnosis not present

## 2016-11-22 DIAGNOSIS — M79675 Pain in left toe(s): Secondary | ICD-10-CM | POA: Diagnosis not present

## 2016-11-22 DIAGNOSIS — B353 Tinea pedis: Secondary | ICD-10-CM | POA: Diagnosis not present

## 2016-11-25 ENCOUNTER — Ambulatory Visit: Payer: Medicare Other | Admitting: Podiatry

## 2016-12-06 ENCOUNTER — Ambulatory Visit: Payer: Medicare Other | Admitting: Internal Medicine

## 2016-12-07 DIAGNOSIS — M1712 Unilateral primary osteoarthritis, left knee: Secondary | ICD-10-CM | POA: Diagnosis not present

## 2016-12-08 ENCOUNTER — Telehealth: Payer: Self-pay

## 2016-12-08 NOTE — Telephone Encounter (Signed)
Error

## 2016-12-22 ENCOUNTER — Other Ambulatory Visit: Payer: Self-pay | Admitting: Internal Medicine

## 2016-12-27 ENCOUNTER — Other Ambulatory Visit: Payer: Self-pay

## 2016-12-27 DIAGNOSIS — Z79899 Other long term (current) drug therapy: Secondary | ICD-10-CM

## 2016-12-27 MED ORDER — ALENDRONATE SODIUM 70 MG PO TABS: 70.0000 mg | ORAL_TABLET | ORAL | 3 refills | Status: DC

## 2016-12-27 NOTE — Telephone Encounter (Signed)
She has not had the every 6 weeks surveillance labs needed for the methotrexate refill (CBC and CMET) since January so I will not refill MTX until those are done (she has not seen rheumatology either, no labs done at [facility]; I already checked)  I will refill the alendronate.

## 2016-12-27 NOTE — Telephone Encounter (Signed)
Rx request for Alendronate Sodium 75 mg tablet and Methotrexate 2.5 mg Last OV 09/14/2016 Next OV None  Cx appt 12/06/2016 Last refills: Alendronate and Methotrexate:  03/03/2016

## 2016-12-28 ENCOUNTER — Ambulatory Visit (INDEPENDENT_AMBULATORY_CARE_PROVIDER_SITE_OTHER): Payer: Medicare Other

## 2016-12-28 ENCOUNTER — Encounter: Payer: Self-pay | Admitting: Family

## 2016-12-28 ENCOUNTER — Ambulatory Visit (INDEPENDENT_AMBULATORY_CARE_PROVIDER_SITE_OTHER): Payer: Medicare Other | Admitting: Family

## 2016-12-28 VITALS — BP 126/82 | HR 75 | Temp 98.5°F | Ht 59.0 in | Wt 173.0 lb

## 2016-12-28 DIAGNOSIS — R918 Other nonspecific abnormal finding of lung field: Secondary | ICD-10-CM | POA: Diagnosis not present

## 2016-12-28 DIAGNOSIS — J4541 Moderate persistent asthma with (acute) exacerbation: Secondary | ICD-10-CM

## 2016-12-28 MED ORDER — PREDNISONE 10 MG PO TABS: ORAL_TABLET | ORAL | 0 refills | Status: DC

## 2016-12-28 NOTE — Progress Notes (Signed)
Subjective:    Patient ID: Cindy Oconnor, female    DOB: 1971-04-08, 46 y.o.   MRN: [id number]  CC: Cindy Oconnor is a 46 y.o. female who presents today for an acute visit.    HPI: [id number] cough 6 days, worsening.   Accompanied by mother who gives most of history.   "little sob and wheezing. ' No fever, sinus pressure, ear pain.    H/o asthma  Uses xopenex inhaler ( hasn't used nebulizer)  With some relief.        Seen 08/2015 and given prednisone taper for asthma exacerbation  H/o RA- stays on 2.5 mg prednisone, downs syndrome    HISTORY:  Past Medical History:  Diagnosis Date  . Arthritis   . Asthma   . Blood clot in vein   . Chicken pox   . Deafness in left ear   . Down syndrome   . RA (rheumatoid arthritis) ([place])   . Ulcerative colitis [facility])    Past Surgical History:  Procedure Laterality Date  . ABDOMINAL HYSTERECTOMY  1992   partial  . FOOT SURGERY     x2   Family History  Problem Relation Age of Onset  . Heart disease Mother   . Diabetes Mother   . Heart disease Father   . Diabetes Father   . Alcohol abuse Maternal Grandmother   . Arthritis Maternal Grandmother   . Stroke Maternal Grandmother   . Diabetes Maternal Grandmother   . Alcohol abuse Maternal Grandfather   . Arthritis Maternal Grandfather   . Stroke Maternal Grandfather   . Diabetes Maternal Grandfather     Allergies: Erythromycin and Albuterol Current Outpatient Prescriptions on File Prior to Visit  Medication Sig Dispense Refill  . alendronate (FOSAMAX) 70 MG tablet Take 1 tablet (70 mg total) by mouth once a week. Take with a full glass of water on an empty stomach. 4 tablet 3  . budesonide-formoterol (SYMBICORT) 160-4.5 MCG/ACT inhaler Inhale 2 puffs into the lungs 2 (two) times daily. 1 Inhaler 3  . calcium carbonate (OSCAL) 1500 (600 Ca) MG TABS tablet Take 1 tablet (1,500 mg total) by mouth daily. 30 tablet 4  . carbamide peroxide (DEBROX) 6.5 % otic  solution Place 5 drops into both ears as needed (to remove earwax).    . cholecalciferol (VITAMIN D) 1000 units tablet Take 1 tablet (1,000 Units total) by mouth daily. 30 tablet 5  . folic acid (FOLVITE) 1 MG tablet TAKE ONE TABLET BY MOUTH ONCE A DAY. (FOLIC ACID SUPPLEMENT) 30 tablet 10  . ketoconazole (NIZORAL) 2 % cream Apply 1 application topically daily. 60 g 2  . ketotifen (ZADITOR) 0.025 % ophthalmic solution Place 1 drop into both eyes 2 (two) times daily as needed.    . lactulose (CHRONULAC) 10 GM/15ML solution Take 30 g by mouth daily as needed for mild constipation.    Marland Kitchen levalbuterol (XOPENEX) 0.63 MG/3ML nebulizer solution Take 3 mLs (0.63 mg total) by nebulization 3 (three) times daily as needed for wheezing or shortness of breath. 3 mL 6  . levothyroxine (SYNTHROID, LEVOTHROID) 125 MCG tablet TAKE ONE TABLET BY MOUTH BEFORE BREAKFAST 30 tablet 10  . loratadine (CLARITIN) 10 MG tablet Take 1 tablet (10 mg total) by mouth daily. 30 tablet 11  . meloxicam (MOBIC) 7.5 MG tablet TAKE ONE TABLET BY MOUTH EACH DAY. 15 tablet 11  . methotrexate (RHEUMATREX) 2.5 MG tablet Take 7 tablets (17.5 mg total) by mouth once a week. Caution:Chemotherapy.  Protect from light. 28 tablet 5  . montelukast (SINGULAIR) 10 MG tablet Take 1 tablet (10 mg total) by mouth at bedtime. 30 tablet 3  . Multiple Vitamins-Minerals (ONE-A-DAY VITACRAVES) CHEW Two chewables daily 70 tablet 6  . omeprazole (PRILOSEC) 20 MG capsule Take 1 capsule (20 mg total) by mouth 2 (two) times daily. 60 capsule 5  . ondansetron (ZOFRAN) 4 MG tablet Take 4 mg by mouth every 8 (eight) hours as needed for nausea or vomiting.     . predniSONE (DELTASONE) 2.5 MG tablet TAKE 1 TABLET BY MOUTH DAILY *STARTING AFTER TAPER COMPLETED* 30 tablet 0  . senna (SENEXON) 8.6 MG tablet Take 1 tablet (8.6 mg total) by mouth at bedtime. 30 tablet 3  . STOOL SOFTENER 100 MG capsule TAKE 1 CAPSULE BY MOUTH EVERY OTHER DAY 15 capsule 5   No current  facility-administered medications on file prior to visit.     Social History  Substance Use Topics  . Smoking status: Never Smoker  . Smokeless tobacco: Never Used  . Alcohol use No    Review of Systems  Constitutional: Negative for chills and fever.  HENT: Positive for congestion.   Respiratory: Positive for cough, shortness of breath and wheezing.   Cardiovascular: Negative for chest pain and palpitations.  Gastrointestinal: Negative for nausea and vomiting.      Objective:    BP 126/82   Pulse 75   Temp 98.5 F (36.9 C) (Oral)   Ht 4' 11"  (1.499 m)   Wt 173 lb (78.5 kg)   LMP 04/11/1981   SpO2 97%   BMI 34.94 kg/m    Physical Exam  Constitutional: She appears well-developed and well-nourished.  HENT:  Head: Normocephalic and atraumatic.  Right Ear: Hearing, tympanic membrane, external ear and ear canal normal. No drainage, swelling or tenderness. No foreign bodies. Tympanic membrane is not erythematous and not bulging. No middle ear effusion. No decreased hearing is noted.  Left Ear: Hearing, tympanic membrane, external ear and ear canal normal. No drainage, swelling or tenderness. No foreign bodies. Tympanic membrane is not erythematous and not bulging.  No middle ear effusion. No decreased hearing is noted.  Nose: Nose normal. No rhinorrhea. Right sinus exhibits no maxillary sinus tenderness and no frontal sinus tenderness. Left sinus exhibits no maxillary sinus tenderness and no frontal sinus tenderness.  Mouth/Throat: Uvula is midline, oropharynx is clear and moist and mucous membranes are normal. No oropharyngeal exudate, posterior oropharyngeal edema, posterior oropharyngeal erythema or tonsillar abscesses.  Eyes: Conjunctivae are normal.  Cardiovascular: Regular rhythm, normal heart sounds and normal pulses.   Pulmonary/Chest: Effort normal. She has wheezes in the right lower field and the left lower field. She has no rhonchi. She has no rales.  Lymphadenopathy:         Head (right side): No submental, no submandibular, no tonsillar, no preauricular, no posterior auricular and no occipital adenopathy present.       Head (left side): No submental, no submandibular, no tonsillar, no preauricular, no posterior auricular and no occipital adenopathy present.    She has no cervical adenopathy.  Neurological: She is alert.  Skin: Skin is warm and dry.  Psychiatric: She has a normal mood and affect. Her speech is normal and behavior is normal. Thought content normal.  Vitals reviewed.  Note:offered neb in office; however allergic to albuterol. Gave note to give group home regarding giving xopenex neb as prescribed       Assessment & Plan:   Problem  List Items Addressed This Visit      Respiratory   Moderate persistent asthma with acute exacerbation - Primary    SA2 97%. Patient is not laboring her speech. She is in no acute respiratory distress.  Pending CXR to ensure no infectious etiology. Education provided to patient and mother regarding Xopenex nebulizer at group home. prednisone taper started today with close vigilance advised.      Relevant Medications   predniSONE (DELTASONE) 10 MG tablet   Other Relevant Orders   DG Chest 2 View         I am having Ms. Pelc start on predniSONE. I am also having her maintain her ondansetron, carbamide peroxide, lactulose, montelukast, loratadine, budesonide-formoterol, calcium carbonate, cholecalciferol, ketoconazole, levalbuterol, methotrexate, ONE-A-DAY VITACRAVES, omeprazole, senna, folic acid, ketotifen, predniSONE, STOOL SOFTENER, meloxicam, levothyroxine, and alendronate.   Meds ordered this encounter  Medications  . predniSONE (DELTASONE) 10 MG tablet    Sig: Take 4 tablets ( total 40 mg) by mouth for 2 days; take 3 tablets ( total 30 mg) by mouth for 2 days; take 2 tablets ( total 20 mg) by mouth for 1 day; take 1 tablet ( total 10 mg) by mouth for 1 day.    Dispense:  17 tablet    Refill:   0    Order Specific Question:   Supervising Provider    Answer:   Crecencio Mc [[id number]]    Return precautions given.   Risks, benefits, and alternatives of the medications and treatment plan prescribed today were discussed, and patient expressed understanding.   Education regarding symptom management and diagnosis given to patient on AVS.  Continue to follow with Crecencio Mc, MD for routine health maintenance.   Cindy Oconnor and I agreed with plan.   Mable Paris, FNP

## 2016-12-28 NOTE — Patient Instructions (Signed)
Start prednisone  Pending xray and if any infection, will start prednisone taper.   Please let us know if not improving  Note provided for Xopenex nebulizer as discussed

## 2016-12-28 NOTE — Progress Notes (Signed)
Pre visit review using our clinic review tool, if applicable. No additional management support is needed unless otherwise documented below in the visit note. 

## 2016-12-28 NOTE — Assessment & Plan Note (Addendum)
SA2 97%. Patient is not laboring her speech. She is in no acute respiratory distress.  Pending CXR to ensure no infectious etiology. Education provided to patient and mother regarding Xopenex nebulizer at group home. prednisone taper started today with close vigilance advised.

## 2016-12-29 ENCOUNTER — Encounter
Admission: RE | Admit: 2016-12-29 | Discharge: 2016-12-29 | Disposition: A | Discharge status: Home / Self Care | Payer: Medicare Other | Source: Ambulatory Visit | Attending: Orthopedic Surgery | Admitting: Orthopedic Surgery

## 2016-12-29 ENCOUNTER — Telehealth: Payer: Self-pay | Admitting: Internal Medicine

## 2016-12-29 DIAGNOSIS — Z01810 Encounter for preprocedural cardiovascular examination: Secondary | ICD-10-CM | POA: Insufficient documentation

## 2016-12-29 DIAGNOSIS — Z01812 Encounter for preprocedural laboratory examination: Secondary | ICD-10-CM | POA: Insufficient documentation

## 2016-12-29 DIAGNOSIS — Q909 Down syndrome, unspecified: Secondary | ICD-10-CM | POA: Insufficient documentation

## 2016-12-29 HISTORY — DX: Hypothyroidism, unspecified: E03.9

## 2016-12-29 HISTORY — DX: Gastro-esophageal reflux disease without esophagitis: K21.9

## 2016-12-29 HISTORY — DX: Systemic lupus erythematosus, unspecified: M32.9

## 2016-12-29 LAB — SURGICAL PCR SCREEN
MRSA, PCR: NEGATIVE
STAPHYLOCOCCUS AUREUS: NEGATIVE

## 2016-12-29 LAB — CBC
HEMATOCRIT: 40.1 % (ref 35.0–47.0)
HEMOGLOBIN: 13.2 g/dL (ref 12.0–16.0)
MCH: 34.2 pg — ABNORMAL HIGH (ref 26.0–34.0)
MCHC: 32.9 g/dL (ref 32.0–36.0)
MCV: 104.1 fL — ABNORMAL HIGH (ref 80.0–100.0)
Platelets: 346 10*3/uL (ref 150–440)
RBC: 3.85 MIL/uL (ref 3.80–5.20)
RDW: 17.5 % — ABNORMAL HIGH (ref 11.5–14.5)
WBC: 10.7 10*3/uL (ref 3.6–11.0)

## 2016-12-29 LAB — COMPREHENSIVE METABOLIC PANEL
ALBUMIN: 3.7 g/dL (ref 3.5–5.0)
ALK PHOS: 79 U/L (ref 38–126)
ALT: 21 U/L (ref 14–54)
AST: 34 U/L (ref 15–41)
Anion gap: 10 (ref 5–15)
BILIRUBIN TOTAL: 0.5 mg/dL (ref 0.3–1.2)
BUN: 17 mg/dL (ref 6–20)
CALCIUM: 9 mg/dL (ref 8.9–10.3)
CO2: 28 mmol/L (ref 22–32)
Chloride: 105 mmol/L (ref 101–111)
Creatinine, Ser: 0.87 mg/dL (ref 0.44–1.00)
GFR calc Af Amer: >60 mL/min (ref >60)
GFR calc non Af Amer: >60 mL/min (ref >60)
GLUCOSE: 144 mg/dL — AB (ref 65–99)
POTASSIUM: 4.2 mmol/L (ref 3.5–5.1)
Sodium: 143 mmol/L (ref 135–145)
Total Protein: 7.1 g/dL (ref 6.5–8.1)

## 2016-12-29 LAB — URINALYSIS, ROUTINE W REFLEX MICROSCOPIC
Bilirubin Urine: NEGATIVE
GLUCOSE, UA: NEGATIVE mg/dL
Hgb urine dipstick: NEGATIVE
Ketones, ur: NEGATIVE mg/dL
LEUKOCYTES UA: NEGATIVE
NITRITE: NEGATIVE
PROTEIN: NEGATIVE mg/dL
Specific Gravity, Urine: 1.005 (ref 1.005–1.030)
pH: 7 (ref 5.0–8.0)

## 2016-12-29 LAB — APTT: APTT: 35 s (ref 24–36)

## 2016-12-29 LAB — PROTIME-INR
INR: 1.03
Prothrombin Time: 13.5 seconds (ref 11.4–15.2)

## 2016-12-29 LAB — SEDIMENTATION RATE: SED RATE: 3 mm/h (ref 0–20)

## 2016-12-29 LAB — C-REACTIVE PROTEIN: CRP: 1.4 mg/dL — AB (ref <1.0)

## 2016-12-29 LAB — TYPE AND SCREEN
ABO/RH(D): A POS
ANTIBODY SCREEN: NEGATIVE

## 2016-12-29 NOTE — Patient Instructions (Signed)
  Your procedure is scheduled [id number] August 13 , 2018. Report to Same Day Surgery. To find out your arrival time please call [phone] between 1PM - 3PM on Friday  January 07, 2017.  Remember: Instructions that are not followed completely may result in serious medical risk, up to and including death, or upon the discretion of your surgeon and anesthesiologist your surgery may need to be rescheduled.    _x___ 1. Do not eat food or drink liquids after midnight. No gum chewing or hard candies.     ____ 2. No Alcohol for 24 hours before or after surgery.   ____ 3. Bring all medications with you on the day of surgery if instructed.    __x__ 4. Notify your doctor if there is any change in your medical condition     (cold, fever, infections).    _____ 5. No smoking 24 hours prior to surgery.     Do not wear jewelry, make-up, hairpins, clips or nail polish.  Do not wear lotions, powders, or perfumes.   Do not shave 48 hours prior to surgery. Men may shave face and neck.  Do not bring valuables to the hospital.    [facility] is not responsible for any belongings or valuables.               Contacts, dentures or bridgework may not be worn into surgery.  Leave your suitcase in the car. After surgery it may be brought to your room.  For patients admitted to the hospital, discharge time is determined by your treatment team.   Patients discharged the day of surgery will not be allowed to drive home.    Please read over the following fact sheets that you were given:   [facility] Preparing for Surgery  __x__ Take these medicines the morning of surgery with A SIP OF WATER:    1. levothyroxine (SYNTHROID, LEVOTHROID)  2. omeprazole (PRILOSEC)   3. predniSONE (DELTASONE)    ____ Fleet Enema (as directed)   __x__ Use CHG Soap as directed on instruction sheet  __x__ Use inhalers on the day of surgery and bring to hospital day of surgery  ____ Stop metformin 2 days prior to  surgery    ____ Take 1/2 of usual insulin dose the night before surgery and none on the morning of  surgery.   ____ Stop Coumadin/Plavix/aspirin on does not apply.  __x__ Stop Anti-inflammatories such as Advil, Aleve, Ibuprofen, meloxicam (MOBIC) Motrin, Naproxen, Naprosyn, Goodies  powders or aspirin products. OK to take Tylenol.   ____ Stop supplements until after surgery.    ____ Bring C-Pap to the hospital.

## 2016-12-29 NOTE — Pre-Procedure Instructions (Addendum)
INFO REQUESTING CLEARANCE BY PCP,AS INSTRUCTED BY DR AMY PENWARDWEN,FAXED TO DR HOOTEN'S OFFICE. EKG COMPARED WITH OLD AND OK BY DR Randa Lynn

## 2016-12-29 NOTE — Telephone Encounter (Signed)
yes

## 2016-12-29 NOTE — Telephone Encounter (Signed)
Pt mom called to follow up on pt xray results. Mom needs the results today if possible. Please advise?  Call mom @ [phone]. Thank you!

## 2016-12-29 NOTE — Telephone Encounter (Signed)
Would it be ok to schedule pt in one of the 11:30am or 4 :30pm slots that is available for next week? Pt's surgery is August 13th.

## 2016-12-29 NOTE — Pre-Procedure Instructions (Signed)
Pt is to see PCP for medical clearance prior to surgery.

## 2016-12-29 NOTE — Telephone Encounter (Signed)
Please see result note 

## 2016-12-29 NOTE — Telephone Encounter (Signed)
[facility] called to get pt sch for a re eval before her surgery. No appt avail to sch. Please let me know where to sch pt. A clearance form will faxed in from pt appt at orthopedic. Pt surgery is Aug 13. Please advise?

## 2016-12-29 NOTE — Pre-Procedure Instructions (Signed)
Pt has been seen by M. Arnnett PA for cough and chest congestion.

## 2016-12-29 NOTE — Telephone Encounter (Signed)
Pt mom called back about something at the bottom of the paper. Please advise?  Call mom @ [phone]. Thank you!

## 2016-12-30 LAB — URINE CULTURE: SPECIAL REQUESTS: NORMAL

## 2016-12-30 NOTE — Telephone Encounter (Signed)
Please call patient or mother back this patient is already scheduled for 01/05/17 at 11:30 see chart. Front desk ask me to schedule this morning when I think it was her mother that called.

## 2016-12-30 NOTE — Telephone Encounter (Signed)
Patients mom has been informed.

## 2016-12-30 NOTE — Telephone Encounter (Signed)
Spoke with pt mom and clarified with her that the pt's appt was actually already scheduled this morning for 01/05/2017 @ 11:30am by the group home. Mother gave a verbal understanding.

## 2016-12-30 NOTE — Telephone Encounter (Signed)
Can you schedule pt for 01/06/2017 @ 11:30am for medical clearance per Dr. Derrel Nip. Pt's mother(guardian) is aware of appt date and time.

## 2016-12-31 ENCOUNTER — Ambulatory Visit (INDEPENDENT_AMBULATORY_CARE_PROVIDER_SITE_OTHER): Payer: Medicare Other | Admitting: Family Medicine

## 2016-12-31 ENCOUNTER — Emergency Department: Payer: Medicare Other

## 2016-12-31 ENCOUNTER — Encounter: Payer: Self-pay | Admitting: *Deleted

## 2016-12-31 ENCOUNTER — Emergency Department
Admission: EM | Admit: 2016-12-31 | Discharge: 2016-12-31 | Disposition: A | Discharge status: Home / Self Care | Payer: Medicare Other | Attending: Emergency Medicine | Admitting: Emergency Medicine

## 2016-12-31 ENCOUNTER — Other Ambulatory Visit: Payer: Self-pay

## 2016-12-31 ENCOUNTER — Encounter: Payer: Self-pay | Admitting: Family Medicine

## 2016-12-31 DIAGNOSIS — Z79899 Other long term (current) drug therapy: Secondary | ICD-10-CM | POA: Insufficient documentation

## 2016-12-31 DIAGNOSIS — E039 Hypothyroidism, unspecified: Secondary | ICD-10-CM | POA: Diagnosis not present

## 2016-12-31 DIAGNOSIS — R0602 Shortness of breath: Secondary | ICD-10-CM | POA: Diagnosis not present

## 2016-12-31 DIAGNOSIS — Z791 Long term (current) use of non-steroidal anti-inflammatories (NSAID): Secondary | ICD-10-CM | POA: Diagnosis not present

## 2016-12-31 DIAGNOSIS — J4541 Moderate persistent asthma with (acute) exacerbation: Secondary | ICD-10-CM | POA: Diagnosis not present

## 2016-12-31 DIAGNOSIS — J40 Bronchitis, not specified as acute or chronic: Secondary | ICD-10-CM | POA: Diagnosis not present

## 2016-12-31 DIAGNOSIS — R05 Cough: Secondary | ICD-10-CM | POA: Diagnosis not present

## 2016-12-31 LAB — CBC WITH DIFFERENTIAL/PLATELET
Basophils Absolute: 0 10*3/uL (ref 0–0.1)
Basophils Relative: 1 %
EOS ABS: 0 10*3/uL (ref 0–0.7)
EOS PCT: 0 %
HCT: 37 % (ref 35.0–47.0)
Hemoglobin: 12.8 g/dL (ref 12.0–16.0)
LYMPHS ABS: 0.4 10*3/uL — AB (ref 1.0–3.6)
LYMPHS PCT: 4 %
MCH: 36.4 pg — AB (ref 26.0–34.0)
MCHC: 34.5 g/dL (ref 32.0–36.0)
MCV: 105.6 fL — AB (ref 80.0–100.0)
MONO ABS: 0.1 10*3/uL — AB (ref 0.2–0.9)
Monocytes Relative: 1 %
Neutro Abs: 7.7 10*3/uL — ABNORMAL HIGH (ref 1.4–6.5)
Neutrophils Relative %: 94 %
Platelets: 296 10*3/uL (ref 150–440)
RBC: 3.5 MIL/uL — ABNORMAL LOW (ref 3.80–5.20)
RDW: 17.3 % — ABNORMAL HIGH (ref 11.5–14.5)
WBC: 8.1 10*3/uL (ref 3.6–11.0)

## 2016-12-31 LAB — BASIC METABOLIC PANEL
Anion gap: 7 (ref 5–15)
BUN: 23 mg/dL — AB (ref 6–20)
CHLORIDE: 107 mmol/L (ref 101–111)
CO2: 27 mmol/L (ref 22–32)
Calcium: 8.8 mg/dL — ABNORMAL LOW (ref 8.9–10.3)
Creatinine, Ser: 1.09 mg/dL — ABNORMAL HIGH (ref 0.44–1.00)
GFR calc Af Amer: >60 mL/min (ref >60)
GFR calc non Af Amer: 59 mL/min — ABNORMAL LOW (ref >60)
GLUCOSE: 143 mg/dL — AB (ref 65–99)
POTASSIUM: 4.5 mmol/L (ref 3.5–5.1)
Sodium: 141 mmol/L (ref 135–145)

## 2016-12-31 LAB — TROPONIN I: Troponin I: <0.03 ng/mL (ref <0.03)

## 2016-12-31 MED ORDER — IPRATROPIUM-ALBUTEROL 0.5-2.5 (3) MG/3ML IN SOLN: 3.0000 mL | Freq: Once | RESPIRATORY_TRACT | Status: DC

## 2016-12-31 MED ORDER — AMOXICILLIN-POT CLAVULANATE 875-125 MG PO TABS
ORAL_TABLET | ORAL | Status: AC
  Administered 2016-12-31: 1 via ORAL
  Filled 2016-12-31: qty 1

## 2016-12-31 MED ORDER — LEVALBUTEROL HCL 1.25 MG/0.5ML IN NEBU
INHALATION_SOLUTION | RESPIRATORY_TRACT | Status: AC
  Administered 2016-12-31: 1.25 mg
  Filled 2016-12-31: qty 0.5

## 2016-12-31 MED ORDER — AMOXICILLIN-POT CLAVULANATE 875-125 MG PO TABS: 1.0000 | ORAL_TABLET | Freq: Two times a day (BID) | ORAL | 0 refills | Status: DC

## 2016-12-31 MED ORDER — LEVALBUTEROL HCL 1.25 MG/3ML IN NEBU
1.2500 mg | INHALATION_SOLUTION | Freq: Once | RESPIRATORY_TRACT | Status: DC
  Filled 2016-12-31: qty 3

## 2016-12-31 MED ORDER — AMOXICILLIN-POT CLAVULANATE 875-125 MG PO TABS
1.0000 | ORAL_TABLET | Freq: Once | ORAL | Status: AC
  Administered 2016-12-31: 1 via ORAL

## 2016-12-31 NOTE — ED Notes (Signed)
Pt. Going home with mother.

## 2016-12-31 NOTE — Patient Instructions (Signed)
Nice to see you. Please go to the emergency room for evaluation of your symptoms. If you develop worsening trouble breathing or chest pain please call [phone] in route.

## 2016-12-31 NOTE — ED Notes (Signed)
Pt. Was able to walk around in room with no difficulty, sats remained above 94 02.  Pt. Sat down and 02 went down to 91 while sitting.

## 2016-12-31 NOTE — ED Provider Notes (Signed)
[facility] Emergency Department Provider Note  Time seen: 5:50 PM  I have reviewed the triage vital signs and the nursing notes.   HISTORY  Chief Complaint Shortness of Breath    HPI Cindy Oconnor is a 46 y.o. female with a past medical history of Down syndrome, lupus, RA, presents to the emergency department for cough. According to the patient and her caregiver the patient has been coughing for the past one week. She has a history of asthma and is currently being treated by her primary care doctor with steroids and breathing treatments. Patient return to the primary care doctor today for recheck and hadn't ambulating pulse ox of 88% so she was sent to the ER for evaluation. Here the patient appears well, no distress, satting 97% currently on room air in the bed at rest. Occasional cough during exam. No chest pain. No trouble breathing. No fever.  Past Medical History:  Diagnosis Date  . Arthritis   . Asthma   . Blood clot in vein   . Chicken pox   . Deafness in left ear   . Down syndrome   . GERD (gastroesophageal reflux disease)   . Hypothyroidism   . Lupus 1995  . RA (rheumatoid arthritis) ([place])   . Ulcerative colitis [facility])     Patient Active Problem List   Diagnosis Date Noted  . Moderate persistent asthma with acute exacerbation 09/14/2016  . Chronic ulcerative colitis ([place]) 09/26/2015  . Dysphagia 08/26/2015  . Osteoarthritis 01/30/2014  . Osteoporosis 01/30/2014  . Mild tricuspid regurgitation 02/13/2013  . Routine general medical examination at a health care facility 01/24/2013  . Obesity (BMI 30-39.9) 01/21/2012  . Recurrent boils 03/12/2009  . Rheumatoid arthritis ([place]) 06/11/2006  . HYSTERECTOMY, HX OF 06/11/2006  . Hypothyroid 03/18/2006  . Asthma, chronic 03/18/2006  . GERD 03/18/2006  . Down's syndrome 03/18/2006    Past Surgical History:  Procedure Laterality Date  . ABDOMINAL HYSTERECTOMY  1992   partial  . FOOT  SURGERY Bilateral    x2    Prior to Admission medications   Medication Sig Start Date End Date Taking? Authorizing Provider  alendronate (FOSAMAX) 70 MG tablet Take 1 tablet (70 mg total) by mouth once a week. Take with a full glass of water on an empty stomach. Patient taking differently: Take 70 mg by mouth every Friday. Take with a full glass of water on an empty stomach. 12/27/16   Crecencio Mc, MD  benzonatate (TESSALON) 100 MG capsule Take 100 mg by mouth 3 (three) times daily as needed for cough.    [provider]  budesonide-formoterol (SYMBICORT) 160-4.5 MCG/ACT inhaler Inhale 2 puffs into the lungs 2 (two) times daily. Patient taking differently: Inhale 2 puffs into the lungs 2 (two) times daily. (0700 & 1900) 03/03/16   Crecencio Mc, MD  calcium carbonate (OSCAL) 1500 (600 Ca) MG TABS tablet Take 1 tablet (1,500 mg total) by mouth daily. Patient taking differently: Take 600 mg of elemental calcium by mouth daily. (0700) 03/03/16   Crecencio Mc, MD  carbamide peroxide (DEBROX) 6.5 % otic solution Place 5 drops into both ears as needed (to remove earwax).    [provider]  cholecalciferol (VITAMIN D) 1000 units tablet Take 1 tablet (1,000 Units total) by mouth daily. Patient taking differently: Take 1,000 Units by mouth daily. (0700) 03/03/16   Crecencio Mc, MD  folic acid (FOLVITE) 1 MG tablet TAKE ONE TABLET BY MOUTH ONCE  A DAY. (FOLIC ACID SUPPLEMENT) Patient taking differently: TAKE ONE TABLET BY MOUTH ONCE A DAY. (FOLIC ACID SUPPLEMENT) AT 0700 04/19/16   Crecencio Mc, MD  gentamicin ointment (GARAMYCIN) 0.1 % Apply 1 application topically 3 (three) times daily as needed (for rash/boils).    [provider]  guaifenesin (HUMIBID E) 400 MG TABS tablet Take 400 mg by mouth every 4 (four) hours as needed (for congestion).    [provider]  guaiFENesin (ROBITUSSIN) 100 MG/5ML liquid Take 100 mg by mouth every 4 (four) hours as needed  (for cough or to loosen phlegm).    [provider]  ketoconazole (NIZORAL) 2 % cream Apply 1 application topically daily. Patient taking differently: Apply 1 application topically daily. (2000) Applied to feet 03/03/16   Crecencio Mc, MD  ketotifen (ZADITOR) 0.025 % ophthalmic solution Place 1 drop into both eyes 2 (two) times daily as needed (for allergiesgu).     [provider]  lactulose (CHRONULAC) 10 GM/15ML solution Take 30 g by mouth daily as needed for mild constipation.    [provider]  levalbuterol Penne Lash) 0.63 MG/3ML nebulizer solution Take 3 mLs (0.63 mg total) by nebulization 3 (three) times daily as needed for wheezing or shortness of breath. 03/03/16   Crecencio Mc, MD  levothyroxine (SYNTHROID, LEVOTHROID) 125 MCG tablet TAKE ONE TABLET BY MOUTH BEFORE BREAKFAST Patient taking differently: TAKE ONE TABLET BY MOUTH DAILY BEFORE BREAKFAST EXCEPT ON SATURDAYS 12/22/16   Crecencio Mc, MD  loratadine (CLARITIN) 10 MG tablet Take 1 tablet (10 mg total) by mouth daily. Patient taking differently: Take 10 mg by mouth daily. (0700) 03/03/16   Crecencio Mc, MD  meloxicam (MOBIC) 7.5 MG tablet TAKE ONE TABLET BY MOUTH EACH DAY. Patient taking differently: TAKE ONE TABLET BY MOUTH EACH DAY AT 0700 11/19/16   Crecencio Mc, MD  methotrexate (RHEUMATREX) 2.5 MG tablet Take 7 tablets (17.5 mg total) by mouth once a week. Caution:Chemotherapy. Protect from light. Patient taking differently: Take 17.5 mg by mouth every Friday. Caution:Chemotherapy. Protect from light. 03/03/16   Crecencio Mc, MD  montelukast (SINGULAIR) 10 MG tablet Take 1 tablet (10 mg total) by mouth at bedtime. Patient taking differently: Take 10 mg by mouth daily at 8 pm.  03/03/16   Crecencio Mc, MD  Multiple Vitamins-Minerals (ONE-A-DAY VITACRAVES) CHEW Two chewables daily Patient taking differently: Chew 2 tablets by mouth daily. (0700) Two chewables daily 03/03/16   Crecencio Mc, MD  omeprazole (PRILOSEC) 20 MG capsule Take 1 capsule (20 mg total) by mouth 2 (two) times daily. Patient taking differently: Take 20 mg by mouth 2 (two) times daily. (0700 & 1700) 03/03/16   Crecencio Mc, MD  ondansetron (ZOFRAN) 4 MG tablet Take 4 mg by mouth every 8 (eight) hours as needed for nausea or vomiting.     [provider]  predniSONE (DELTASONE) 10 MG tablet Take 4 tablets ( total 40 mg) by mouth for 2 days; take 3 tablets ( total 30 mg) by mouth for 2 days; take 2 tablets ( total 20 mg) by mouth for 1 day; take 1 tablet ( total 10 mg) by mouth for 1 day. 12/28/16   Burnard Hawthorne, FNP  predniSONE (DELTASONE) 2.5 MG tablet TAKE 1 TABLET BY MOUTH DAILY *STARTING AFTER TAPER COMPLETED* Patient taking differently: TAKE 1 TABLET BY MOUTH DAILY AT 0700 05/25/16   Crecencio Mc, MD  senna (SENEXON) 8.6 MG tablet  Take 1 tablet (8.6 mg total) by mouth at bedtime. Patient taking differently: Take 1 tablet by mouth daily at 8 pm.  04/01/16   Crecencio Mc, MD  STOOL SOFTENER 100 MG capsule TAKE 1 CAPSULE BY MOUTH EVERY OTHER DAY Patient taking differently: TAKE 1 CAPSULE BY MOUTH EVERY OTHER DAY at 0700 08/12/16   Crecencio Mc, MD    Allergies  Allergen Reactions  . Erythromycin Other (See Comments), Rash and Shortness Of Breath    Reaction:  Unknown  Reaction:  Unknown   . Albuterol Other (See Comments) and Rash    Reaction:Makes pt jittery  "Jittery" Reaction:  Makes pt jittery     Family History  Problem Relation Age of Onset  . Heart disease Mother   . Diabetes Mother   . Heart disease Father   . Diabetes Father   . Alcohol abuse Maternal Grandmother   . Arthritis Maternal Grandmother   . Stroke Maternal Grandmother   . Diabetes Maternal Grandmother   . Alcohol abuse Maternal Grandfather   . Arthritis Maternal Grandfather   . Stroke Maternal Grandfather   . Diabetes Maternal Grandfather     Social History Social History  Substance Use Topics   . Smoking status: Never Smoker  . Smokeless tobacco: Never Used  . Alcohol use No    Review of Systems Constitutional: Negative for fever. Cardiovascular: Negative for chest pain. Respiratory: Negative for shortness of breath.Positive for cough. Gastrointestinal: Negative for abdominal pain Neurological: Negative for headache All other ROS negative  ____________________________________________   PHYSICAL EXAM:  VITAL SIGNS: ED Triage Vitals  Enc Vitals Group     BP 12/31/16 1426 116/74     Pulse Rate 12/31/16 1426 73     Resp 12/31/16 1426 18     Temp 12/31/16 1426 98.4 F (36.9 C)     Temp Source 12/31/16 1426 Oral     SpO2 12/31/16 1426 99 %     Weight 12/31/16 1426 173 lb (78.5 kg)     Height 12/31/16 1426 4' 11"  (1.499 m)     Head Circumference --      Peak Flow --      Pain Score 12/31/16 1425 0     Pain Loc --      Pain Edu? --      Excl. in [place]? --     Constitutional: Alert and oriented. Well appearing and in no distress. Eyes: Normal exam ENT   Head: Normocephalic and atraumatic.   Mouth/Throat: Mucous membranes are moist. Cardiovascular: Normal rate, regular rhythm.  Respiratory: Normal respiratory effort without tachypnea nor retractions. Breath sounds are clear  Gastrointestinal: Soft and nontender. No distention.  Musculoskeletal: Nontender with normal range of motion in all extremities. No lower extremity tenderness  Neurologic:  Normal speech and language. No gross focal neurologic deficits  Skin:  Skin is warm, dry and intact.  Psychiatric: Mood and affect are normal.  ____________________________________________    EKG  EKG reviewed and interpreted by myself shows normal sinus rhythm at 69 bpm, narrow QRS, normal axis, normal intervals, nonspecific but no concerning ST changes.  ____________________________________________    RADIOLOGY  Chest x-ray negative  ____________________________________________   INITIAL IMPRESSION /  ASSESSMENT AND PLAN / ED COURSE  Pertinent labs & imaging results that were available during my care of the patient were reviewed by me and considered in my medical decision making (see chart for details).  Patient presents to the emergency department for cough 1 week.  Chest x-ray is clear. Patient is a 97% room air saturation currently. Patient does have occasional cough in the emergency department. We'll dose 2 breathing treatments, check labs, ambulate the patient. Overall she appears very well, no distress  Patient continues to do well in the emergency department satting 96-98% in the bed. Patient did briefly desat to 91% while ambulating but this came back within several seconds to 9697%. Patient appears very well with otherwise normal appearing chest x-ray and labs. Patient will continue steroids at home. We will cover with Augmentin for possible bronchitis.  ____________________________________________   FINAL CLINICAL IMPRESSION(S) / ED DIAGNOSES  Cough    Harvest Dark, MD 12/31/16 2036

## 2016-12-31 NOTE — ED Triage Notes (Signed)
Pt was diagnosed with bronchitis on Wednesday, states she is on steroids and breathing txs, states sOB that began this AM, at PCP office for recheck states when ambulating her o2 sat was 88%, hx of asthma, pt in no acute distress, awake and alert

## 2016-12-31 NOTE — Assessment & Plan Note (Signed)
Patient continues to have symptoms and actually feels worse now. Has been short of breath and wheezing. Some sharp chest discomfort. Has been lightheaded. Ambulatory oxygen saturation dropped to 88%. Lungs with fairly significant wheezes and coarse breath sounds. Patient is not in any acute respiratory distress though with her oxygen dropping and persistent symptoms despite outpatient treatment discussed evaluation in the emergency room for likely IV steroids and possibly starting on antibiotics with consideration of repeat chest x-ray and other lab work particularly in the setting of lightheadedness to evaluate for dehydration. Patient and her mother were in agreement with this. She will transport the patient to the ED. CMA contacted ED to inform them that are honored their way. Given precautions to call EMS in route.

## 2016-12-31 NOTE — Progress Notes (Signed)
  Tommi Rumps, MD Phone: [phone]  Cindy Oconnor is a 46 y.o. female who presents today for same-day visit.  Patient seen 3 or so days ago by a nurse practitioner and diagnosed with an asthma exacerbation. She was started on prednisone. She notes she feels worse. Notes coughing up yellow mucus. She's been wheezing. No fevers. She's been short of breath. She does note some central sharp chest discomfort that occurs with coughing. She's been using her inhalers. She's been getting a little lightheaded. No syncope. She's been trying to drink lots of water. She has a history of this in the past and has required hospitalizations for pulmonary issues previously.  ROS see history of present illness  Objective  Physical Exam Vitals:   12/31/16 1322  BP: 109/74  Pulse: 72  Temp: 98.6 F (37 C)    BP Readings from Last 3 Encounters:  12/31/16 109/74  12/29/16 120/71  12/28/16 126/82   Wt Readings from Last 3 Encounters:  12/29/16 173 lb (78.5 kg)  12/28/16 173 lb (78.5 kg)  09/14/16 165 lb (74.8 kg)    Physical Exam  Constitutional: No distress.  Cardiovascular: Normal rate, regular rhythm and normal heart sounds.   Pulmonary/Chest: Effort normal. No respiratory distress. She has wheezes (expiratory). She exhibits tenderness (left-sided costochondral tenderness).  Coarse breath sounds throughout  Musculoskeletal: She exhibits no edema.  Neurological: She is alert.  Skin: Skin is warm and dry. She is not diaphoretic.   Ambulatory oxygen saturation 88%  Assessment/Plan: Please see individual problem list.  Moderate persistent asthma with acute exacerbation Patient continues to have symptoms and actually feels worse now. Has been short of breath and wheezing. Some sharp chest discomfort. Has been lightheaded. Ambulatory oxygen saturation dropped to 88%. Lungs with fairly significant wheezes and coarse breath sounds. Patient is not in any acute respiratory distress though  with her oxygen dropping and persistent symptoms despite outpatient treatment discussed evaluation in the emergency room for likely IV steroids and possibly starting on antibiotics with consideration of repeat chest x-ray and other lab work particularly in the setting of lightheadedness to evaluate for dehydration. Patient and her mother were in agreement with this. She will transport the patient to the ED. CMA contacted ED to inform them that are honored their way. Given precautions to call EMS in route.  Tommi Rumps, MD [place]

## 2016-12-31 NOTE — ED Notes (Signed)
Pt. States dx early this week with bronchitis,  Not improving with breathing treatments at home.

## 2017-01-03 NOTE — Telephone Encounter (Signed)
LMTCB with pt's mother, Cindy Oconnor. Pt will be in the office on 8/82018 for surgical clearance.

## 2017-01-03 NOTE — Addendum Note (Signed)
Addended by: Adair Laundry on: 01/03/2017 06:00 PM   Modules accepted: Orders

## 2017-01-05 ENCOUNTER — Encounter: Payer: Self-pay | Admitting: Internal Medicine

## 2017-01-05 ENCOUNTER — Ambulatory Visit (INDEPENDENT_AMBULATORY_CARE_PROVIDER_SITE_OTHER): Payer: Medicare Other | Admitting: Internal Medicine

## 2017-01-05 DIAGNOSIS — Z01818 Encounter for other preprocedural examination: Secondary | ICD-10-CM

## 2017-01-05 DIAGNOSIS — J4541 Moderate persistent asthma with (acute) exacerbation: Secondary | ICD-10-CM

## 2017-01-05 DIAGNOSIS — D7589 Other specified diseases of blood and blood-forming organs: Secondary | ICD-10-CM | POA: Diagnosis not present

## 2017-01-05 DIAGNOSIS — Z79899 Other long term (current) drug therapy: Secondary | ICD-10-CM | POA: Diagnosis not present

## 2017-01-05 LAB — CBC WITH DIFFERENTIAL/PLATELET
Basophils Absolute: 0.1 10*3/uL (ref 0.0–0.1)
Basophils Relative: 1 % (ref 0.0–3.0)
EOS ABS: 0.1 10*3/uL (ref 0.0–0.7)
Eosinophils Relative: 1.4 % (ref 0.0–5.0)
HCT: 43.2 % (ref 36.0–46.0)
HEMOGLOBIN: 14 g/dL (ref 12.0–15.0)
LYMPHS PCT: 10.3 % — AB (ref 12.0–46.0)
Lymphs Abs: 0.9 10*3/uL (ref 0.7–4.0)
MCHC: 32.5 g/dL (ref 30.0–36.0)
MCV: 107.8 fl — ABNORMAL HIGH (ref 78.0–100.0)
MONO ABS: 0.6 10*3/uL (ref 0.1–1.0)
Monocytes Relative: 6.4 % (ref 3.0–12.0)
Neutro Abs: 7.5 10*3/uL (ref 1.4–7.7)
Neutrophils Relative %: 80.9 % — ABNORMAL HIGH (ref 43.0–77.0)
Platelets: 410 10*3/uL — ABNORMAL HIGH (ref 150.0–400.0)
RBC: 4.01 Mil/uL (ref 3.87–5.11)
RDW: 18.3 % — ABNORMAL HIGH (ref 11.5–15.5)
WBC: 9.2 10*3/uL (ref 4.0–10.5)

## 2017-01-05 LAB — COMPREHENSIVE METABOLIC PANEL
ALBUMIN: 4.3 g/dL (ref 3.5–5.2)
ALK PHOS: 87 U/L (ref 39–117)
ALT: 28 U/L (ref 0–35)
AST: 27 U/L (ref 0–37)
BILIRUBIN TOTAL: 0.4 mg/dL (ref 0.2–1.2)
BUN: 13 mg/dL (ref 6–23)
CO2: 34 mEq/L — ABNORMAL HIGH (ref 19–32)
CREATININE: 1.01 mg/dL (ref 0.40–1.20)
Calcium: 9.6 mg/dL (ref 8.4–10.5)
Chloride: 99 mEq/L (ref 96–112)
GFR: 62.08 mL/min (ref >60.00)
Glucose, Bld: 99 mg/dL (ref 70–99)
Potassium: 4 mEq/L (ref 3.5–5.1)
SODIUM: 138 meq/L (ref 135–145)
TOTAL PROTEIN: 7.4 g/dL (ref 6.0–8.3)

## 2017-01-05 MED ORDER — BISACODYL 5 MG PO TBEC: 5.0000 mg | DELAYED_RELEASE_TABLET | Freq: Every day | ORAL | 11 refills | Status: DC

## 2017-01-05 MED ORDER — BISACODYL 5 MG PO TBEC: 5.0000 mg | DELAYED_RELEASE_TABLET | Freq: Every day | ORAL | 3 refills | Status: DC

## 2017-01-05 MED ORDER — BENZONATATE 100 MG PO CAPS: 100.0000 mg | ORAL_CAPSULE | Freq: Three times a day (TID) | ORAL | 0 refills | Status: DC

## 2017-01-05 NOTE — Progress Notes (Signed)
Subjective:  Patient ID: Cindy Oconnor, female    DOB: 08-Sep-1970  Age: 46 y.o. MRN: [id number]  CC: Diagnoses of Long-term use of high-risk medication, Moderate persistent asthma with acute exacerbation, Macrocytosis without anemia, and Preoperative clearance were pertinent to this visit.  HPI Cindy Oconnor presents for preoperative medical clearance for left total knee arthroplasy by Dr Marry Guan  Patient was treated by Dr Chauncey Cruel on August 3rd for persistent  asthma exacerbation With ambulatory desaturations to 88% and wheezing,  Taken to ER .  CXR was negative for pneumonia.  EKG was normal. She was given nebs x 2 and ambulatory desaturations on room air  Dropped briefly  to 91% but quickly improved to 96%.  She was given augmentin and continued steroid taper.    augmentin will be finished on aug 10  Prednisone taper was for 6 days starting on July 31 , then 2.5 mg resumed starting yesterday  Still taking robitussin and tessalon prn.  Denies any wheezing or malaise.  Staff notices no coughwhen unsupervised,  But coughs  To get  Attention. Mother agrees (present today)     Outpatient Medications Prior to Visit  Medication Sig Dispense Refill  . alendronate (FOSAMAX) 70 MG tablet Take 1 tablet (70 mg total) by mouth once a week. Take with a full glass of water on an empty stomach. (Patient taking differently: Take 70 mg by mouth every Friday. Take with a full glass of water on an empty stomach.) 4 tablet 3  . budesonide-formoterol (SYMBICORT) 160-4.5 MCG/ACT inhaler Inhale 2 puffs into the lungs 2 (two) times daily. (Patient taking differently: Inhale 2 puffs into the lungs 2 (two) times daily. (0700 & 1900)) 1 Inhaler 3  . calcium carbonate (OSCAL) 1500 (600 Ca) MG TABS tablet Take 1 tablet (1,500 mg total) by mouth daily. (Patient taking differently: Take 600 mg of elemental calcium by mouth daily. (0700)) 30 tablet 4  . carbamide peroxide (DEBROX) 6.5 % otic solution Place 5 drops into  both ears as needed (to remove earwax).    . cholecalciferol (VITAMIN D) 1000 units tablet Take 1 tablet (1,000 Units total) by mouth daily. (Patient taking differently: Take 1,000 Units by mouth daily. (0700)) 30 tablet 5  . folic acid (FOLVITE) 1 MG tablet TAKE ONE TABLET BY MOUTH ONCE A DAY. (FOLIC ACID SUPPLEMENT) (Patient taking differently: TAKE ONE TABLET BY MOUTH ONCE A DAY. (FOLIC ACID SUPPLEMENT) AT 0700) 30 tablet 10  . gentamicin ointment (GARAMYCIN) 0.1 % Apply 1 application topically 3 (three) times daily as needed (for rash/boils).    Marland Kitchen guaifenesin (HUMIBID E) 400 MG TABS tablet Take 400 mg by mouth every 4 (four) hours as needed (for congestion).    Marland Kitchen guaiFENesin (ROBITUSSIN) 100 MG/5ML liquid Take 100 mg by mouth every 4 (four) hours as needed (for cough or to loosen phlegm).    Marland Kitchen ketoconazole (NIZORAL) 2 % cream Apply 1 application topically daily. (Patient taking differently: Apply 1 application topically daily. (2000) Applied to feet) 60 g 2  . ketotifen (ZADITOR) 0.025 % ophthalmic solution Place 1 drop into both eyes 2 (two) times daily as needed (for allergiesgu).     Marland Kitchen lactulose (CHRONULAC) 10 GM/15ML solution Take 30 g by mouth daily as needed for mild constipation.    Marland Kitchen levalbuterol (XOPENEX) 0.63 MG/3ML nebulizer solution Take 3 mLs (0.63 mg total) by nebulization 3 (three) times daily as needed for wheezing or shortness of breath. 3 mL 6  . levothyroxine (SYNTHROID,  LEVOTHROID) 125 MCG tablet TAKE ONE TABLET BY MOUTH BEFORE BREAKFAST (Patient taking differently: TAKE ONE TABLET BY MOUTH DAILY BEFORE BREAKFAST EXCEPT ON SATURDAYS) 30 tablet 10  . loratadine (CLARITIN) 10 MG tablet Take 1 tablet (10 mg total) by mouth daily. (Patient taking differently: Take 10 mg by mouth daily. (0700)) 30 tablet 11  . meloxicam (MOBIC) 7.5 MG tablet TAKE ONE TABLET BY MOUTH EACH DAY. (Patient taking differently: TAKE ONE TABLET BY MOUTH EACH DAY AT 0700) 15 tablet 11  . methotrexate  (RHEUMATREX) 2.5 MG tablet Take 7 tablets (17.5 mg total) by mouth once a week. Caution:Chemotherapy. Protect from light. (Patient taking differently: Take 17.5 mg by mouth every Friday. Caution:Chemotherapy. Protect from light.) 28 tablet 5  . montelukast (SINGULAIR) 10 MG tablet Take 1 tablet (10 mg total) by mouth at bedtime. (Patient taking differently: Take 10 mg by mouth daily at 8 pm. ) 30 tablet 3  . Multiple Vitamins-Minerals (ONE-A-DAY VITACRAVES) CHEW Two chewables daily (Patient taking differently: Chew 2 tablets by mouth daily. (0700) Two chewables daily) 70 tablet 6  . omeprazole (PRILOSEC) 20 MG capsule Take 1 capsule (20 mg total) by mouth 2 (two) times daily. (Patient taking differently: Take 20 mg by mouth 2 (two) times daily. (0700 & 1700)) 60 capsule 5  . ondansetron (ZOFRAN) 4 MG tablet Take 4 mg by mouth every 8 (eight) hours as needed for nausea or vomiting.     . predniSONE (DELTASONE) 10 MG tablet Take 4 tablets ( total 40 mg) by mouth for 2 days; take 3 tablets ( total 30 mg) by mouth for 2 days; take 2 tablets ( total 20 mg) by mouth for 1 day; take 1 tablet ( total 10 mg) by mouth for 1 day. 17 tablet 0  . predniSONE (DELTASONE) 2.5 MG tablet TAKE 1 TABLET BY MOUTH DAILY *STARTING AFTER TAPER COMPLETED* (Patient taking differently: TAKE 1 TABLET BY MOUTH DAILY AT 0700) 30 tablet 0  . senna (SENEXON) 8.6 MG tablet Take 1 tablet (8.6 mg total) by mouth at bedtime. (Patient taking differently: Take 1 tablet by mouth daily at 8 pm. ) 30 tablet 3  . STOOL SOFTENER 100 MG capsule TAKE 1 CAPSULE BY MOUTH EVERY OTHER DAY (Patient taking differently: TAKE 1 CAPSULE BY MOUTH EVERY OTHER DAY at 0700) 15 capsule 5  . amoxicillin-clavulanate (AUGMENTIN) 875-125 MG tablet Take 1 tablet by mouth 2 (two) times daily. 14 tablet 0  . benzonatate (TESSALON) 100 MG capsule Take 100 mg by mouth 3 (three) times daily as needed for cough.     No facility-administered medications prior to visit.      Review of Systems;  Patient denies headache, fevers, malaise, unintentional weight loss, skin rash, eye pain, sinus congestion and sinus pain, sore throat, dysphagia,  hemoptysis , cough, dyspnea, wheezing, chest pain, palpitations, orthopnea, edema, abdominal pain, nausea, melena, diarrhea, constipation, flank pain, dysuria, hematuria, urinary  Frequency, nocturia, numbness, tingling, seizures,  Focal weakness, Loss of consciousness,  Tremor, insomnia, depression, anxiety, and suicidal ideation.      Objective:  BP 122/82 (BP Location: Left Arm, Patient Position: Sitting, Cuff Size: Normal)   Pulse 80   Temp 98.2 F (36.8 C) (Oral)   Resp 15   Ht 4' 11"  (1.499 m)   Wt 171 lb 12.8 oz (77.9 kg)   LMP 04/11/1981   SpO2 96%   BMI 34.70 kg/m   BP Readings from Last 3 Encounters:  01/05/17 122/82  12/31/16 110/76  12/31/16  109/74    Wt Readings from Last 3 Encounters:  01/05/17 171 lb 12.8 oz (77.9 kg)  12/31/16 173 lb (78.5 kg)  12/29/16 173 lb (78.5 kg)    General appearance: alert, cooperative and appears stated age Ears: normal TM's and external ear canals both ears Throat: lips, mucosa, and tongue normal; teeth and gums normal Neck: no adenopathy, no carotid bruit, supple, symmetrical, trachea midline and thyroid not enlarged, symmetric, no tenderness/mass/nodules Back: symmetric, no curvature. ROM normal. No CVA tenderness. Lungs: clear to auscultation bilaterally Heart: regular rate and rhythm, S1, S2 normal, no murmur, click, rub or gallop Abdomen: soft, non-tender; bowel sounds normal; no masses,  no organomegaly Pulses: 2+ and symmetric Skin: Skin color, texture, turgor normal. No rashes or lesions Lymph nodes: Cervical, supraclavicular, and axillary nodes normal.  Lab Results  Component Value Date   HGBA1C 5.7 06/27/2013    Lab Results  Component Value Date   CREATININE 1.01 01/05/2017   CREATININE 1.09 (H) 12/31/2016   CREATININE 0.87 12/29/2016     Lab Results  Component Value Date   WBC 9.2 01/05/2017   HGB 14.0 01/05/2017   HCT 43.2 01/05/2017   PLT 410.0 (H) 01/05/2017   GLUCOSE 99 01/05/2017   CHOL 174 05/19/2015   TRIG 97.0 05/19/2015   HDL 47.50 05/19/2015   LDLCALC 107 (H) 05/19/2015   ALT 28 01/05/2017   AST 27 01/05/2017   NA 138 01/05/2017   K 4.0 01/05/2017   CL 99 01/05/2017   CREATININE 1.01 01/05/2017   BUN 13 01/05/2017   CO2 34 (H) 01/05/2017   TSH 4.40 06/07/2016   INR 1.03 12/29/2016   HGBA1C 5.7 06/27/2013    Dg Chest 2 View  Result Date: 12/31/2016 CLINICAL DATA:  Shortness of breath. EXAM: CHEST  2 VIEW COMPARISON:  12/28/2016 . FINDINGS: Mediastinum and hilar structures are normal . Heart size normal. No focal infiltrate. Mild bilateral pleural thickening noted consistent scarring. No pneumothorax . IMPRESSION: 1.  No acute cardiopulmonary disease. 2.  Mild bilateral pleural thickening consistent with scarring. Electronically Signed   By: Marcello Moores  Register   On: 12/31/2016 14:59    Assessment & Plan:   Problem List Items Addressed This Visit    Preoperative clearance    Patient is considered medically cleared and low risk for surgical complications related to total knee replacement and general anesthesia       Moderate persistent asthma with acute exacerbation    Acute exacerbation resolved. Will stop augmentin given the absence of infiltrates on chest x ray.        Macrocytosis without anemia    Checking b12  Again in one month  .   Lab Results  Component Value Date   VITAMINB12 300 06/07/2016         Relevant Orders   Vitamin B12   CBC with Differential/Platelet    Other Visit Diagnoses    Long-term use of high-risk medication       Relevant Orders   Vitamin B12   CBC with Differential/Platelet      I have discontinued Ms. Bobrowski's amoxicillin-clavulanate. I have also changed her benzonatate. Additionally, I am having her maintain her ondansetron, carbamide peroxide,  lactulose, montelukast, loratadine, budesonide-formoterol, calcium carbonate, cholecalciferol, ketoconazole, levalbuterol, methotrexate, ONE-A-DAY VITACRAVES, omeprazole, senna, folic acid, ketotifen, predniSONE, STOOL SOFTENER, meloxicam, levothyroxine, alendronate, predniSONE, gentamicin ointment, guaifenesin, guaiFENesin, and bisacodyl.  Meds ordered this encounter  Medications  . benzonatate (TESSALON) 100 MG capsule    Sig: Take  1 capsule (100 mg total) by mouth 3 (three) times daily. For 7 days,  Then every 8 hours as needed    Dispense:  60 capsule    Refill:  0  . DISCONTD: bisacodyl (DULCOLAX) 5 MG EC tablet    Sig: Take 1 tablet (5 mg total) by mouth at bedtime.    Dispense:  30 tablet    Refill:  11  . bisacodyl (DULCOLAX) 5 MG EC tablet    Sig: Take 1 tablet (5 mg total) by mouth at bedtime.    Dispense:  90 tablet    Refill:  3    Medications Discontinued During This Encounter  Medication Reason  . benzonatate (TESSALON) 100 MG capsule Reorder  . amoxicillin-clavulanate (AUGMENTIN) 875-125 MG tablet   . bisacodyl (DULCOLAX) 5 MG EC tablet Reorder    Follow-up: No Follow-up on file.   Crecencio Mc, MD

## 2017-01-05 NOTE — Patient Instructions (Signed)
I am stopping the antibiotic amoxicillin   I am scheduling the tessalon perles to be given every 8 hours of the next week (except for the morning of surgery) .  After that it can be given as needed for cough   I am changing the  laxative from  sennakot to  dulcolax at bedtime for ease in administration (smaller tablet)

## 2017-01-06 NOTE — Pre-Procedure Instructions (Signed)
SEEN BY DR TULLO ,ADDRESSED CXR. TESSALON PEARLES TID UNTIL AM SURGERTY 01/10/17 THEN PRN.NOTE ON CHART

## 2017-01-08 DIAGNOSIS — D7589 Other specified diseases of blood and blood-forming organs: Secondary | ICD-10-CM | POA: Insufficient documentation

## 2017-01-08 DIAGNOSIS — D518 Other vitamin B12 deficiency anemias: Secondary | ICD-10-CM | POA: Insufficient documentation

## 2017-01-08 DIAGNOSIS — Z01818 Encounter for other preprocedural examination: Secondary | ICD-10-CM | POA: Insufficient documentation

## 2017-01-08 NOTE — Assessment & Plan Note (Signed)
Acute exacerbation resolved. Will stop augmentin given the absence of infiltrates on chest x ray.

## 2017-01-08 NOTE — Assessment & Plan Note (Signed)
Checking b12  Again in one month  .   Lab Results  Component Value Date   VITAMINB12 300 06/07/2016

## 2017-01-08 NOTE — Assessment & Plan Note (Signed)
Patient is considered medically cleared and low risk for surgical complications related to total knee replacement and general anesthesia

## 2017-01-09 MED ORDER — CEFAZOLIN SODIUM-DEXTROSE 2-4 GM/100ML-% IV SOLN
2.0000 g | INTRAVENOUS | Status: AC
  Administered 2017-01-10: 2 g via INTRAVENOUS

## 2017-01-09 MED ORDER — TRANEXAMIC ACID 1000 MG/10ML IV SOLN
1000.0000 mg | INTRAVENOUS | Status: AC
  Administered 2017-01-10: 1000 mg via INTRAVENOUS
  Filled 2017-01-09: qty 10

## 2017-01-10 ENCOUNTER — Inpatient Hospital Stay: Payer: Medicare Other

## 2017-01-10 ENCOUNTER — Encounter: Payer: Self-pay | Admitting: Orthopedic Surgery

## 2017-01-10 ENCOUNTER — Encounter
Admission: RE | Disposition: A | Discharge status: Skilled Nursing Facility | Payer: Self-pay | Source: Ambulatory Visit | Attending: Orthopedic Surgery

## 2017-01-10 ENCOUNTER — Inpatient Hospital Stay: Payer: Medicare Other | Admitting: Anesthesiology

## 2017-01-10 ENCOUNTER — Inpatient Hospital Stay
Admission: RE | Admit: 2017-01-10 | Discharge: 2017-01-13 | DRG: 470 | Disposition: A | Discharge status: Skilled Nursing Facility | Payer: Medicare Other | Source: Ambulatory Visit | Attending: Orthopedic Surgery | Admitting: Orthopedic Surgery

## 2017-01-10 DIAGNOSIS — E039 Hypothyroidism, unspecified: Secondary | ICD-10-CM | POA: Diagnosis present

## 2017-01-10 DIAGNOSIS — Z96652 Presence of left artificial knee joint: Secondary | ICD-10-CM

## 2017-01-10 DIAGNOSIS — R6889 Other general symptoms and signs: Secondary | ICD-10-CM | POA: Diagnosis not present

## 2017-01-10 DIAGNOSIS — Z79899 Other long term (current) drug therapy: Secondary | ICD-10-CM | POA: Diagnosis not present

## 2017-01-10 DIAGNOSIS — R2689 Other abnormalities of gait and mobility: Secondary | ICD-10-CM | POA: Diagnosis not present

## 2017-01-10 DIAGNOSIS — J45909 Unspecified asthma, uncomplicated: Secondary | ICD-10-CM | POA: Diagnosis present

## 2017-01-10 DIAGNOSIS — I071 Rheumatic tricuspid insufficiency: Secondary | ICD-10-CM | POA: Diagnosis not present

## 2017-01-10 DIAGNOSIS — K219 Gastro-esophageal reflux disease without esophagitis: Secondary | ICD-10-CM | POA: Diagnosis present

## 2017-01-10 DIAGNOSIS — Z86718 Personal history of other venous thrombosis and embolism: Secondary | ICD-10-CM | POA: Diagnosis not present

## 2017-01-10 DIAGNOSIS — Z6841 Body Mass Index (BMI) 40.0 and over, adult: Secondary | ICD-10-CM | POA: Diagnosis not present

## 2017-01-10 DIAGNOSIS — Z791 Long term (current) use of non-steroidal anti-inflammatories (NSAID): Secondary | ICD-10-CM

## 2017-01-10 DIAGNOSIS — M810 Age-related osteoporosis without current pathological fracture: Secondary | ICD-10-CM | POA: Diagnosis present

## 2017-01-10 DIAGNOSIS — Q909 Down syndrome, unspecified: Secondary | ICD-10-CM

## 2017-01-10 DIAGNOSIS — M069 Rheumatoid arthritis, unspecified: Secondary | ICD-10-CM | POA: Diagnosis present

## 2017-01-10 DIAGNOSIS — Z7951 Long term (current) use of inhaled steroids: Secondary | ICD-10-CM

## 2017-01-10 DIAGNOSIS — Z7952 Long term (current) use of systemic steroids: Secondary | ICD-10-CM | POA: Diagnosis not present

## 2017-01-10 DIAGNOSIS — M1712 Unilateral primary osteoarthritis, left knee: Principal | ICD-10-CM | POA: Diagnosis present

## 2017-01-10 DIAGNOSIS — Z471 Aftercare following joint replacement surgery: Secondary | ICD-10-CM | POA: Diagnosis not present

## 2017-01-10 DIAGNOSIS — Z96659 Presence of unspecified artificial knee joint: Secondary | ICD-10-CM

## 2017-01-10 DIAGNOSIS — Z7983 Long term (current) use of bisphosphonates: Secondary | ICD-10-CM

## 2017-01-10 DIAGNOSIS — Z7401 Bed confinement status: Secondary | ICD-10-CM | POA: Diagnosis not present

## 2017-01-10 DIAGNOSIS — K51919 Ulcerative colitis, unspecified with unspecified complications: Secondary | ICD-10-CM | POA: Diagnosis not present

## 2017-01-10 DIAGNOSIS — K5100 Ulcerative (chronic) pancolitis without complications: Secondary | ICD-10-CM | POA: Diagnosis present

## 2017-01-10 DIAGNOSIS — J4540 Moderate persistent asthma, uncomplicated: Secondary | ICD-10-CM | POA: Diagnosis not present

## 2017-01-10 DIAGNOSIS — M6281 Muscle weakness (generalized): Secondary | ICD-10-CM | POA: Diagnosis not present

## 2017-01-10 DIAGNOSIS — M1990 Unspecified osteoarthritis, unspecified site: Secondary | ICD-10-CM | POA: Diagnosis not present

## 2017-01-10 HISTORY — PX: KNEE ARTHROPLASTY: SHX992

## 2017-01-10 HISTORY — DX: Ulcerative (chronic) pancolitis without complications: K51.00

## 2017-01-10 LAB — ABO/RH: ABO/RH(D): A POS

## 2017-01-10 SURGERY — ARTHROPLASTY, KNEE, TOTAL, USING IMAGELESS COMPUTER-ASSISTED NAVIGATION
Anesthesia: Spinal | Site: Knee | Laterality: Left | Wound class: Clean

## 2017-01-10 MED ORDER — CEFAZOLIN SODIUM-DEXTROSE 2-4 GM/100ML-% IV SOLN
INTRAVENOUS | Status: AC
  Filled 2017-01-10: qty 100

## 2017-01-10 MED ORDER — TETRACAINE HCL 1 % IJ SOLN
INTRAMUSCULAR | Status: DC | PRN
  Administered 2017-01-10: 2 mg via INTRASPINAL

## 2017-01-10 MED ORDER — CARBAMIDE PEROXIDE 6.5 % OT SOLN
5.0000 [drp] | OTIC | Status: DC | PRN
  Filled 2017-01-10: qty 15

## 2017-01-10 MED ORDER — NEOMYCIN-POLYMYXIN B GU 40-200000 IR SOLN
Status: AC
  Filled 2017-01-10: qty 20

## 2017-01-10 MED ORDER — BUPIVACAINE HCL (PF) 0.25 % IJ SOLN
INTRAMUSCULAR | Status: AC
  Filled 2017-01-10: qty 30

## 2017-01-10 MED ORDER — MIDAZOLAM HCL 5 MG/5ML IJ SOLN
INTRAMUSCULAR | Status: DC | PRN
  Administered 2017-01-10: 2 mg via INTRAVENOUS

## 2017-01-10 MED ORDER — MONTELUKAST SODIUM 10 MG PO TABS
10.0000 mg | ORAL_TABLET | Freq: Every day | ORAL | Status: DC
  Administered 2017-01-10 – 2017-01-12 (×3): 10 mg via ORAL
  Filled 2017-01-10 (×3): qty 1

## 2017-01-10 MED ORDER — FENTANYL CITRATE (PF) 100 MCG/2ML IJ SOLN
25.0000 ug | INTRAMUSCULAR | Status: DC | PRN
  Administered 2017-01-10 (×4): 25 ug via INTRAVENOUS

## 2017-01-10 MED ORDER — TRAMADOL HCL 50 MG PO TABS
50.0000 mg | ORAL_TABLET | ORAL | Status: DC | PRN
  Administered 2017-01-10 – 2017-01-11 (×5): 100 mg via ORAL
  Administered 2017-01-12 (×2): 50 mg via ORAL
  Administered 2017-01-12 – 2017-01-13 (×3): 100 mg via ORAL
  Filled 2017-01-10 (×2): qty 2
  Filled 2017-01-10: qty 1
  Filled 2017-01-10 (×5): qty 2
  Filled 2017-01-10: qty 1
  Filled 2017-01-10: qty 2

## 2017-01-10 MED ORDER — METOCLOPRAMIDE HCL 10 MG PO TABS
10.0000 mg | ORAL_TABLET | Freq: Three times a day (TID) | ORAL | Status: AC
  Administered 2017-01-10 – 2017-01-12 (×7): 10 mg via ORAL
  Filled 2017-01-10 (×6): qty 1

## 2017-01-10 MED ORDER — GUAIFENESIN 100 MG/5ML PO SOLN
5.0000 mL | ORAL | Status: DC | PRN
  Filled 2017-01-10: qty 5

## 2017-01-10 MED ORDER — GLYCOPYRROLATE 0.2 MG/ML IJ SOLN
INTRAMUSCULAR | Status: AC
  Filled 2017-01-10: qty 1

## 2017-01-10 MED ORDER — TETRACAINE HCL 1 % IJ SOLN
INTRAMUSCULAR | Status: AC
  Filled 2017-01-10: qty 2

## 2017-01-10 MED ORDER — EPHEDRINE SULFATE 50 MG/ML IJ SOLN
20.0000 mg | Freq: Once | INTRAMUSCULAR | Status: AC
  Administered 2017-01-10: 20 mg via INTRAVENOUS

## 2017-01-10 MED ORDER — PHENYLEPHRINE HCL 10 MG/ML IJ SOLN
INTRAMUSCULAR | Status: DC | PRN
  Administered 2017-01-10: 30 ug/min via INTRAVENOUS

## 2017-01-10 MED ORDER — PROPOFOL 500 MG/50ML IV EMUL
INTRAVENOUS | Status: DC | PRN
  Administered 2017-01-10: 25 ug/kg/min via INTRAVENOUS

## 2017-01-10 MED ORDER — PANTOPRAZOLE SODIUM 40 MG PO TBEC
40.0000 mg | DELAYED_RELEASE_TABLET | Freq: Two times a day (BID) | ORAL | Status: DC
  Administered 2017-01-10 – 2017-01-13 (×6): 40 mg via ORAL
  Filled 2017-01-10 (×6): qty 1

## 2017-01-10 MED ORDER — PHENOL 1.4 % MT LIQD
1.0000 | OROMUCOSAL | Status: DC | PRN
  Filled 2017-01-10: qty 177

## 2017-01-10 MED ORDER — GLYCOPYRROLATE 0.2 MG/ML IJ SOLN
INTRAMUSCULAR | Status: DC | PRN
  Administered 2017-01-10: 0.2 mg via INTRAVENOUS

## 2017-01-10 MED ORDER — ACETAMINOPHEN 10 MG/ML IV SOLN
1000.0000 mg | Freq: Four times a day (QID) | INTRAVENOUS | Status: AC
  Administered 2017-01-10 – 2017-01-11 (×4): 1000 mg via INTRAVENOUS
  Filled 2017-01-10 (×5): qty 100

## 2017-01-10 MED ORDER — PROPOFOL 10 MG/ML IV BOLUS
INTRAVENOUS | Status: AC
  Filled 2017-01-10: qty 20

## 2017-01-10 MED ORDER — ACETAMINOPHEN 10 MG/ML IV SOLN
INTRAVENOUS | Status: DC | PRN
  Administered 2017-01-10: 1000 mg via INTRAVENOUS

## 2017-01-10 MED ORDER — GUAIFENESIN 100 MG/5ML PO SOLN
20.0000 mL | ORAL | Status: DC | PRN
  Administered 2017-01-11: 400 mg via ORAL
  Administered 2017-01-13 (×2): 200 mg via ORAL
  Filled 2017-01-10 (×5): qty 20

## 2017-01-10 MED ORDER — KETOCONAZOLE 2 % EX CREA
1.0000 "application " | TOPICAL_CREAM | Freq: Every day | CUTANEOUS | Status: DC
  Administered 2017-01-10 – 2017-01-12 (×3): 1 via TOPICAL
  Filled 2017-01-10: qty 15

## 2017-01-10 MED ORDER — BISACODYL 10 MG RE SUPP
10.0000 mg | Freq: Every day | RECTAL | Status: DC | PRN
  Administered 2017-01-11: 10 mg via RECTAL
  Filled 2017-01-10: qty 1

## 2017-01-10 MED ORDER — GUAIFENESIN 400 MG PO TABS: 400.0000 mg | ORAL_TABLET | ORAL | Status: DC | PRN

## 2017-01-10 MED ORDER — GENTAMICIN SULFATE 0.1 % EX OINT
1.0000 "application " | TOPICAL_OINTMENT | Freq: Three times a day (TID) | CUTANEOUS | Status: DC | PRN
  Filled 2017-01-10: qty 15

## 2017-01-10 MED ORDER — ALUM & MAG HYDROXIDE-SIMETH 200-200-20 MG/5ML PO SUSP
30.0000 mL | ORAL | Status: DC | PRN
  Administered 2017-01-13: 30 mL via ORAL
  Filled 2017-01-10: qty 30

## 2017-01-10 MED ORDER — EPHEDRINE SULFATE 50 MG/ML IJ SOLN
INTRAMUSCULAR | Status: AC
  Administered 2017-01-10: 20 mg via INTRAVENOUS
  Filled 2017-01-10: qty 1

## 2017-01-10 MED ORDER — MIDAZOLAM HCL 2 MG/2ML IJ SOLN
INTRAMUSCULAR | Status: AC
  Filled 2017-01-10: qty 2

## 2017-01-10 MED ORDER — ONDANSETRON HCL 4 MG/2ML IJ SOLN
4.0000 mg | Freq: Once | INTRAMUSCULAR | Status: DC | PRN
Start: 2017-01-10 — End: 2017-01-10

## 2017-01-10 MED ORDER — MAGNESIUM HYDROXIDE 400 MG/5ML PO SUSP: 30.0000 mL | Freq: Every day | ORAL | Status: DC | PRN

## 2017-01-10 MED ORDER — DIPHENHYDRAMINE HCL 12.5 MG/5ML PO ELIX: 12.5000 mg | ORAL_SOLUTION | ORAL | Status: DC | PRN

## 2017-01-10 MED ORDER — KETAMINE HCL 50 MG/ML IJ SOLN
INTRAMUSCULAR | Status: AC
  Filled 2017-01-10: qty 10

## 2017-01-10 MED ORDER — ACETAMINOPHEN 650 MG RE SUPP: 650.0000 mg | Freq: Four times a day (QID) | RECTAL | Status: DC | PRN

## 2017-01-10 MED ORDER — DEXAMETHASONE SODIUM PHOSPHATE 4 MG/ML IJ SOLN
INTRAMUSCULAR | Status: DC | PRN
  Administered 2017-01-10: 5 mg via INTRAVENOUS

## 2017-01-10 MED ORDER — PROPOFOL 10 MG/ML IV BOLUS
INTRAVENOUS | Status: DC | PRN
  Administered 2017-01-10 (×2): 10 mg via INTRAVENOUS
  Administered 2017-01-10: 7.5 mg via INTRAVENOUS
  Administered 2017-01-10 (×2): 10 mg via INTRAVENOUS

## 2017-01-10 MED ORDER — MOMETASONE FURO-FORMOTEROL FUM 200-5 MCG/ACT IN AERO
2.0000 | INHALATION_SPRAY | Freq: Two times a day (BID) | RESPIRATORY_TRACT | Status: DC
  Administered 2017-01-10 – 2017-01-13 (×5): 2 via RESPIRATORY_TRACT
  Filled 2017-01-10: qty 8.8

## 2017-01-10 MED ORDER — MORPHINE SULFATE (PF) 2 MG/ML IV SOLN: 2.0000 mg | INTRAVENOUS | Status: DC | PRN

## 2017-01-10 MED ORDER — TERBUTALINE SULFATE 1 MG/ML IJ SOLN
INTRAMUSCULAR | Status: AC
  Filled 2017-01-10: qty 1

## 2017-01-10 MED ORDER — SENNOSIDES-DOCUSATE SODIUM 8.6-50 MG PO TABS
1.0000 | ORAL_TABLET | Freq: Two times a day (BID) | ORAL | Status: DC
  Administered 2017-01-10 – 2017-01-13 (×6): 1 via ORAL
  Filled 2017-01-10 (×6): qty 1

## 2017-01-10 MED ORDER — MENTHOL 3 MG MT LOZG
1.0000 | LOZENGE | OROMUCOSAL | Status: DC | PRN
  Filled 2017-01-10: qty 9

## 2017-01-10 MED ORDER — KETOTIFEN FUMARATE 0.025 % OP SOLN
1.0000 [drp] | Freq: Two times a day (BID) | OPHTHALMIC | Status: DC | PRN
  Filled 2017-01-10: qty 5

## 2017-01-10 MED ORDER — ACETAMINOPHEN 325 MG PO TABS
650.0000 mg | ORAL_TABLET | Freq: Four times a day (QID) | ORAL | Status: DC | PRN
  Administered 2017-01-12 – 2017-01-13 (×2): 650 mg via ORAL
  Filled 2017-01-10 (×2): qty 2

## 2017-01-10 MED ORDER — CHLORHEXIDINE GLUCONATE 4 % EX LIQD: 60.0000 mL | Freq: Once | CUTANEOUS | Status: DC

## 2017-01-10 MED ORDER — FENTANYL CITRATE (PF) 250 MCG/5ML IJ SOLN
INTRAMUSCULAR | Status: AC
  Filled 2017-01-10: qty 5

## 2017-01-10 MED ORDER — TRANEXAMIC ACID 1000 MG/10ML IV SOLN
1000.0000 mg | Freq: Once | INTRAVENOUS | Status: AC
  Administered 2017-01-10: 1000 mg via INTRAVENOUS
  Filled 2017-01-10: qty 10

## 2017-01-10 MED ORDER — ENOXAPARIN SODIUM 30 MG/0.3ML ~~LOC~~ SOLN
30.0000 mg | Freq: Two times a day (BID) | SUBCUTANEOUS | Status: DC
  Administered 2017-01-11 – 2017-01-13 (×5): 30 mg via SUBCUTANEOUS
  Filled 2017-01-10 (×5): qty 0.3

## 2017-01-10 MED ORDER — DEXAMETHASONE SODIUM PHOSPHATE 10 MG/ML IJ SOLN
INTRAMUSCULAR | Status: AC
  Filled 2017-01-10: qty 1

## 2017-01-10 MED ORDER — BUPIVACAINE HCL (PF) 0.5 % IJ SOLN
INTRAMUSCULAR | Status: DC | PRN
  Administered 2017-01-10: 2.6 mL

## 2017-01-10 MED ORDER — SODIUM CHLORIDE 0.9 % IJ SOLN
INTRAMUSCULAR | Status: AC
  Filled 2017-01-10: qty 10

## 2017-01-10 MED ORDER — SODIUM CHLORIDE 0.9 % IV SOLN
INTRAVENOUS | Status: DC | PRN
  Administered 2017-01-10: 60 mL

## 2017-01-10 MED ORDER — FENTANYL CITRATE (PF) 100 MCG/2ML IJ SOLN
INTRAMUSCULAR | Status: AC
  Filled 2017-01-10: qty 2

## 2017-01-10 MED ORDER — FERROUS SULFATE 325 (65 FE) MG PO TABS
325.0000 mg | ORAL_TABLET | Freq: Two times a day (BID) | ORAL | Status: DC
  Administered 2017-01-10 – 2017-01-13 (×5): 325 mg via ORAL
  Filled 2017-01-10 (×5): qty 1

## 2017-01-10 MED ORDER — SODIUM CHLORIDE 0.9 % IJ SOLN
INTRAMUSCULAR | Status: AC
  Filled 2017-01-10: qty 50

## 2017-01-10 MED ORDER — CALCIUM CARBONATE 1500 (600 CA) MG PO TABS
600.0000 mg | ORAL_TABLET | Freq: Every day | ORAL | Status: DC
  Filled 2017-01-10: qty 1

## 2017-01-10 MED ORDER — GUAIFENESIN 100 MG/5ML PO LIQD: 100.0000 mg | ORAL | Status: DC | PRN

## 2017-01-10 MED ORDER — ONDANSETRON HCL 4 MG/2ML IJ SOLN
4.0000 mg | Freq: Four times a day (QID) | INTRAMUSCULAR | Status: DC | PRN
  Administered 2017-01-10: 4 mg via INTRAVENOUS
  Filled 2017-01-10: qty 2

## 2017-01-10 MED ORDER — LEVOTHYROXINE SODIUM 25 MCG PO TABS
125.0000 ug | ORAL_TABLET | Freq: Every day | ORAL | Status: DC
  Administered 2017-01-11 – 2017-01-13 (×3): 125 ug via ORAL
  Filled 2017-01-10 (×3): qty 1

## 2017-01-10 MED ORDER — CEFAZOLIN SODIUM-DEXTROSE 2-4 GM/100ML-% IV SOLN
2.0000 g | Freq: Four times a day (QID) | INTRAVENOUS | Status: AC
  Administered 2017-01-10 – 2017-01-11 (×4): 2 g via INTRAVENOUS
  Filled 2017-01-10 (×4): qty 100

## 2017-01-10 MED ORDER — BUPIVACAINE HCL (PF) 0.25 % IJ SOLN
INTRAMUSCULAR | Status: DC | PRN
  Administered 2017-01-10: 60 mL

## 2017-01-10 MED ORDER — GUAIFENESIN 200 MG PO TABS
400.0000 mg | ORAL_TABLET | ORAL | Status: DC | PRN
  Filled 2017-01-10: qty 2

## 2017-01-10 MED ORDER — FOLIC ACID 1 MG PO TABS
1.0000 mg | ORAL_TABLET | Freq: Every day | ORAL | Status: DC
  Administered 2017-01-10 – 2017-01-13 (×4): 1 mg via ORAL
  Filled 2017-01-10 (×4): qty 1

## 2017-01-10 MED ORDER — FLEET ENEMA 7-19 GM/118ML RE ENEM: 1.0000 | ENEMA | Freq: Once | RECTAL | Status: DC | PRN

## 2017-01-10 MED ORDER — BUPIVACAINE LIPOSOME 1.3 % IJ SUSP
INTRAMUSCULAR | Status: AC
  Filled 2017-01-10: qty 20

## 2017-01-10 MED ORDER — VITAMIN D 1000 UNITS PO TABS
1000.0000 [IU] | ORAL_TABLET | Freq: Every day | ORAL | Status: DC
  Administered 2017-01-10: 1000 [IU] via ORAL
  Filled 2017-01-10: qty 1

## 2017-01-10 MED ORDER — LACTULOSE 10 GM/15ML PO SOLN: 30.0000 g | Freq: Every day | ORAL | Status: DC | PRN

## 2017-01-10 MED ORDER — TRANEXAMIC ACID 1000 MG/10ML IV SOLN
INTRAVENOUS | Status: AC
  Filled 2017-01-10: qty 10

## 2017-01-10 MED ORDER — ADULT MULTIVITAMIN W/MINERALS CH
1.0000 | ORAL_TABLET | Freq: Every day | ORAL | Status: DC
  Administered 2017-01-10 – 2017-01-13 (×4): 1 via ORAL
  Filled 2017-01-10 (×4): qty 1

## 2017-01-10 MED ORDER — FENTANYL CITRATE (PF) 100 MCG/2ML IJ SOLN
INTRAMUSCULAR | Status: DC | PRN
  Administered 2017-01-10: 25 ug via INTRAVENOUS

## 2017-01-10 MED ORDER — VASOPRESSIN 20 UNIT/ML IV SOLN
INTRAVENOUS | Status: DC | PRN
  Administered 2017-01-10: 2 [IU] via INTRAVENOUS

## 2017-01-10 MED ORDER — LACTATED RINGERS IV SOLN
INTRAVENOUS | Status: DC
  Administered 2017-01-10 (×2): via INTRAVENOUS

## 2017-01-10 MED ORDER — SODIUM CHLORIDE 0.9 % IV SOLN
INTRAVENOUS | Status: DC
  Administered 2017-01-10 – 2017-01-11 (×2): via INTRAVENOUS

## 2017-01-10 MED ORDER — PHENYLEPHRINE HCL 10 MG/ML IJ SOLN
INTRAMUSCULAR | Status: DC | PRN
  Administered 2017-01-10 (×3): 100 ug via INTRAVENOUS
  Administered 2017-01-10 (×2): 50 ug via INTRAVENOUS

## 2017-01-10 MED ORDER — LIDOCAINE HCL (PF) 2 % IJ SOLN
INTRAMUSCULAR | Status: AC
  Filled 2017-01-10: qty 2

## 2017-01-10 MED ORDER — ONDANSETRON HCL 4 MG PO TABS: 4.0000 mg | ORAL_TABLET | Freq: Four times a day (QID) | ORAL | Status: DC | PRN

## 2017-01-10 MED ORDER — PREDNISONE 2.5 MG PO TABS
2.5000 mg | ORAL_TABLET | Freq: Every day | ORAL | Status: DC
  Administered 2017-01-11 – 2017-01-13 (×3): 2.5 mg via ORAL
  Filled 2017-01-10 (×3): qty 1

## 2017-01-10 MED ORDER — NEOMYCIN-POLYMYXIN B GU 40-200000 IR SOLN
Status: DC | PRN
  Administered 2017-01-10: 2 mL

## 2017-01-10 MED ORDER — MELOXICAM 7.5 MG PO TABS
7.5000 mg | ORAL_TABLET | Freq: Every day | ORAL | Status: DC
  Administered 2017-01-10 – 2017-01-13 (×4): 7.5 mg via ORAL
  Filled 2017-01-10 (×4): qty 1

## 2017-01-10 MED ORDER — ONE-A-DAY VITACRAVES PO CHEW: 2.0000 | CHEWABLE_TABLET | Freq: Every day | ORAL | Status: DC

## 2017-01-10 MED ORDER — LORATADINE 10 MG PO TABS
10.0000 mg | ORAL_TABLET | Freq: Every day | ORAL | Status: DC
  Administered 2017-01-10 – 2017-01-13 (×4): 10 mg via ORAL
  Filled 2017-01-10 (×4): qty 1

## 2017-01-10 MED ORDER — LEVALBUTEROL HCL 0.63 MG/3ML IN NEBU
0.6300 mg | INHALATION_SOLUTION | Freq: Three times a day (TID) | RESPIRATORY_TRACT | Status: DC | PRN
  Administered 2017-01-13: 0.63 mg via RESPIRATORY_TRACT
  Filled 2017-01-10 (×2): qty 3

## 2017-01-10 MED ORDER — ACETAMINOPHEN 10 MG/ML IV SOLN
INTRAVENOUS | Status: AC
  Filled 2017-01-10: qty 100

## 2017-01-10 MED ORDER — OXYCODONE HCL 5 MG PO TABS
5.0000 mg | ORAL_TABLET | ORAL | Status: DC | PRN
  Administered 2017-01-11: 5 mg via ORAL
  Administered 2017-01-12: 10 mg via ORAL
  Filled 2017-01-10: qty 2
  Filled 2017-01-10 (×2): qty 1

## 2017-01-10 SURGICAL SUPPLY — 65 items
BATTERY INSTRU NAVIGATION (MISCELLANEOUS) ×12 IMPLANT
BLADE CLIPPER SURG (BLADE) ×3 IMPLANT
BLADE SAW 1 (BLADE) ×3 IMPLANT
BLADE SAW 1/2 (BLADE) ×3 IMPLANT
BLADE SAW 70X12.5 (BLADE) IMPLANT
BONE CEMENT GENTAMICIN (Cement) ×6 IMPLANT
CANISTER SUCT 1200ML W/VALVE (MISCELLANEOUS) ×3 IMPLANT
CANISTER SUCT 3000ML PPV (MISCELLANEOUS) ×6 IMPLANT
CAP KNEE TOTAL 3 SIGMA ×3 IMPLANT
CATH TRAY METER 16FR LF (MISCELLANEOUS) ×3 IMPLANT
CEMENT BONE GENTAMICIN 40 (Cement) ×2 IMPLANT
COOLER POLAR GLACIER W/PUMP (MISCELLANEOUS) ×3 IMPLANT
CUFF TOURN 24 STER (MISCELLANEOUS) IMPLANT
CUFF TOURN 30 STER DUAL PORT (MISCELLANEOUS) ×3 IMPLANT
DECANTER SPIKE VIAL GLASS SM (MISCELLANEOUS) ×12 IMPLANT
DRAPE SHEET LG 3/4 BI-LAMINATE (DRAPES) ×3 IMPLANT
DRSG DERMACEA 8X12 NADH (GAUZE/BANDAGES/DRESSINGS) ×3 IMPLANT
DRSG OPSITE POSTOP 4X14 (GAUZE/BANDAGES/DRESSINGS) ×3 IMPLANT
DRSG TEGADERM 4X4.75 (GAUZE/BANDAGES/DRESSINGS) ×3 IMPLANT
DURAPREP 26ML APPLICATOR (WOUND CARE) ×6 IMPLANT
ELECT CAUTERY BLADE 6.4 (BLADE) ×3 IMPLANT
ELECT REM PT RETURN 9FT ADLT (ELECTROSURGICAL) ×3
ELECTRODE REM PT RTRN 9FT ADLT (ELECTROSURGICAL) ×1 IMPLANT
EVACUATOR 1/8 PVC DRAIN (DRAIN) ×3 IMPLANT
EX-PIN ORTHOLOCK NAV 4X150 (PIN) ×6 IMPLANT
GLOVE BIOGEL M STRL SZ7.5 (GLOVE) ×6 IMPLANT
GLOVE BIOGEL PI IND STRL 9 (GLOVE) ×1 IMPLANT
GLOVE BIOGEL PI INDICATOR 9 (GLOVE) ×2
GLOVE INDICATOR 8.0 STRL GRN (GLOVE) ×3 IMPLANT
GLOVE SURG SYN 9.0  PF PI (GLOVE) ×2
GLOVE SURG SYN 9.0 PF PI (GLOVE) ×1 IMPLANT
GOWN STRL REUS W/ TWL LRG LVL3 (GOWN DISPOSABLE) ×2 IMPLANT
GOWN STRL REUS W/TWL 2XL LVL3 (GOWN DISPOSABLE) ×3 IMPLANT
GOWN STRL REUS W/TWL LRG LVL3 (GOWN DISPOSABLE) ×4
HOLDER FOLEY CATH W/STRAP (MISCELLANEOUS) ×3 IMPLANT
HOOD PEEL AWAY FLYTE STAYCOOL (MISCELLANEOUS) ×6 IMPLANT
KIT RM TURNOVER STRD PROC AR (KITS) ×3 IMPLANT
KNIFE SCULPS 14X20 (INSTRUMENTS) ×3 IMPLANT
LABEL OR SOLS (LABEL) ×3 IMPLANT
NDL SAFETY 18GX1.5 (NEEDLE) ×3 IMPLANT
NEEDLE SPNL 20GX3.5 QUINCKE YW (NEEDLE) ×3 IMPLANT
NS IRRIG 500ML POUR BTL (IV SOLUTION) ×3 IMPLANT
PACK TOTAL KNEE (MISCELLANEOUS) ×3 IMPLANT
PAD WRAPON POLAR KNEE (MISCELLANEOUS) ×1 IMPLANT
PIN DRILL QUICK PACK ×3 IMPLANT
PIN FIXATION 1/8DIA X 3INL (PIN) ×3 IMPLANT
PULSAVAC PLUS IRRIG FAN TIP (DISPOSABLE) ×3
SOL .9 NS 3000ML IRR  AL (IV SOLUTION) ×2
SOL .9 NS 3000ML IRR UROMATIC (IV SOLUTION) ×1 IMPLANT
SOL PREP PVP 2OZ (MISCELLANEOUS) ×3
SOLUTION PREP PVP 2OZ (MISCELLANEOUS) ×1 IMPLANT
SPONGE DRAIN TRACH 4X4 STRL 2S (GAUZE/BANDAGES/DRESSINGS) ×3 IMPLANT
STAPLER SKIN PROX 35W (STAPLE) ×3 IMPLANT
STRAP TIBIA SHORT (MISCELLANEOUS) ×3 IMPLANT
SUCTION FRAZIER HANDLE 10FR (MISCELLANEOUS) ×2
SUCTION TUBE FRAZIER 10FR DISP (MISCELLANEOUS) ×1 IMPLANT
SUT VIC AB 0 CT1 36 (SUTURE) ×3 IMPLANT
SUT VIC AB 1 CT1 36 (SUTURE) ×6 IMPLANT
SUT VIC AB 2-0 CT2 27 (SUTURE) ×3 IMPLANT
SYR 20CC LL (SYRINGE) ×3 IMPLANT
SYR 30ML LL (SYRINGE) ×6 IMPLANT
TIP FAN IRRIG PULSAVAC PLUS (DISPOSABLE) ×1 IMPLANT
TOWEL OR 17X26 4PK STRL BLUE (TOWEL DISPOSABLE) ×3 IMPLANT
TOWER CARTRIDGE SMART MIX (DISPOSABLE) ×3 IMPLANT
WRAPON POLAR PAD KNEE (MISCELLANEOUS) ×3

## 2017-01-10 NOTE — Progress Notes (Signed)
Put bone foam on pt and she screamed and cried, refused to use it today. Talked to her and mother about the importance of the bone foam, and they decided they would try again tomorrow, but would not be wearing it today.

## 2017-01-10 NOTE — Progress Notes (Signed)
Pt in bed resting. A&O x4. Family at bedside most of afternoon, mom said she will return. On 2L O2 [place]. Foley in place. Polar Care in use. Knee wrapped, UTA. No drainage on dressing. Pt refused bone foam today, willing to try again tomorrow. Has been tolerating medications, and foods well.

## 2017-01-10 NOTE — NC FL2 (Signed)
[place] LEVEL OF CARE SCREENING TOOL     IDENTIFICATION  Patient Name: Cindy Oconnor Birthdate: 04-04-1971 Sex: female Admission Date (Current Location): 01/10/2017  [facility] and [place] Number:  Engineering geologist and Address:  [facility], [address] [place], [place] [address]      Provider Number: [id number]  Attending Physician Name and Address:  Dereck Leep, MD  Relative Name and Phone Number:       Current Level of Care: Hospital Recommended Level of Care: [place] Prior Approval Number:    Date Approved/Denied:   PASRR Number:    Discharge Plan: SNF    Current Diagnoses: Patient Active Problem List   Diagnosis Date Noted  . S/P total knee arthroplasty 01/10/2017  . Macrocytosis without anemia 01/08/2017  . Preoperative clearance 01/08/2017  . Moderate persistent asthma with acute exacerbation 09/14/2016  . Chronic ulcerative colitis ([place]) 09/26/2015  . Dysphagia 08/26/2015  . Osteoarthritis 01/30/2014  . Osteoporosis 01/30/2014  . Mild tricuspid regurgitation 02/13/2013  . Routine general medical examination at a health care facility 01/24/2013  . Obesity (BMI 30-39.9) 01/21/2012  . Recurrent boils 03/12/2009  . Rheumatoid arthritis ([place]) 06/11/2006  . HYSTERECTOMY, HX OF 06/11/2006  . Hypothyroid 03/18/2006  . Asthma, chronic 03/18/2006  . GERD 03/18/2006  . Down's syndrome 03/18/2006    Orientation RESPIRATION BLADDER Height & Weight     Self, Time, Situation, Place  O2 (2 Liters Oxygen. ) Continent Weight: 186 lb 11.2 oz (84.7 kg) Height:  4' 9"  (144.8 cm)  BEHAVIORAL SYMPTOMS/MOOD NEUROLOGICAL BOWEL NUTRITION STATUS   (none)  (none) Continent Diet (Diet: Clear Liquid to be Advanced. )  AMBULATORY STATUS COMMUNICATION OF NEEDS Skin   Extensive Assist Verbally Surgical wounds (Incision: Left Knee )                       Personal Care Assistance Level of Assistance   Bathing, Feeding, Dressing Bathing Assistance: Limited assistance Feeding assistance: Independent Dressing Assistance: Limited assistance     Functional Limitations Info  Sight, Hearing, Speech Sight Info: Adequate Hearing Info: Adequate Speech Info: Adequate    SPECIAL CARE FACTORS FREQUENCY  PT (By licensed PT), OT (By licensed OT)     PT Frequency:  (5) OT Frequency:  (5)            Contractures      Additional Factors Info  Code Status, Allergies Code Status Info:  (Full Code. ) Allergies Info:  (Erythromycin, Albuterol)           Current Medications (01/10/2017):  This is the current hospital active medication list Current Facility-Administered Medications  Medication Dose Route Frequency Provider Last Rate Last Dose  . 0.9 %  sodium chloride infusion   Intravenous Continuous Hester Forget, Laurice Record, MD      . acetaminophen (OFIRMEV) IV 1,000 mg  1,000 mg Intravenous Q6H Annaleise Burger, Laurice Record, MD      . acetaminophen (TYLENOL) tablet 650 mg  650 mg Oral Q6H PRN Lunah Losasso, Laurice Record, MD       Or  . acetaminophen (TYLENOL) suppository 650 mg  650 mg Rectal Q6H PRN Kerin Cecchi, Laurice Record, MD      . alum & mag hydroxide-simeth (MAALOX/MYLANTA) 200-200-20 MG/5ML suspension 30 mL  30 mL Oral Q4H PRN Emett Stapel, Laurice Record, MD      . bisacodyl (DULCOLAX) suppository 10 mg  10 mg Rectal Daily PRN Dagoberto Nealy, Laurice Record, MD      . [  START ON 01/11/2017] calcium carbonate (OSCAL) tablet 1,500 mg  600 mg of elemental calcium Oral Q breakfast Bennett Vanscyoc, Laurice Record, MD      . carbamide peroxide (DEBROX) 6.5 % OTIC (EAR) solution 5 drop  5 drop Both EARS PRN Tyrrell Stephens, Laurice Record, MD      . ceFAZolin (ANCEF) IVPB 2g/100 mL premix  2 g Intravenous Q6H Bradlee Bridgers, Laurice Record, MD      . cholecalciferol (VITAMIN D) tablet 1,000 Units  1,000 Units Oral Daily Caasi Giglia, Laurice Record, MD      . diphenhydrAMINE (BENADRYL) 12.5 MG/5ML elixir 12.5-25 mg  12.5-25 mg Oral Q4H PRN Mattelyn Imhoff, Laurice Record, MD      . Derrill Memo ON 01/11/2017] enoxaparin (LOVENOX)  injection 30 mg  30 mg Subcutaneous Q12H Nikolaus Pienta, Laurice Record, MD      . fentaNYL (SUBLIMAZE) 100 MCG/2ML injection           . ferrous sulfate tablet 325 mg  325 mg Oral BID WC Vanderbilt Ranieri, Laurice Record, MD      . folic acid (FOLVITE) tablet 1 mg  1 mg Oral Daily [place] Latulippe, Laurice Record, MD      . gentamicin ointment (GARAMYCIN) 0.1 % 1 application  1 application Topical TID PRN Jarryd Gratz, Laurice Record, MD      . guaiFENesin (ROBITUSSIN) 100 MG/5ML solution 100 mg  5 mL Oral Q4H PRN Timaya Bojarski, Laurice Record, MD      . guaiFENesin tablet 400 mg  400 mg Oral Q4H PRN Chaun Uemura, Laurice Record, MD      . ketoconazole (NIZORAL) 2 % cream 1 application  1 application Topical Daily Meiya Wisler, Laurice Record, MD      . ketotifen (ZADITOR) 0.025 % ophthalmic solution 1 drop  1 drop Both Eyes BID PRN Quashaun Lazalde, Laurice Record, MD      . lactulose (CHRONULAC) 10 GM/15ML solution 30 g  30 g Oral Daily PRN Evalee Gerard, Laurice Record, MD      . levalbuterol (XOPENEX) nebulizer solution 0.63 mg  0.63 mg Nebulization TID PRN Dereck Leep, MD      . Derrill Memo ON 01/11/2017] levothyroxine (SYNTHROID, LEVOTHROID) tablet 125 mcg  125 mcg Oral QAC breakfast Deavin Forst, Laurice Record, MD      . loratadine (CLARITIN) tablet 10 mg  10 mg Oral Daily Deona Novitski, Laurice Record, MD      . magnesium hydroxide (MILK OF MAGNESIA) suspension 30 mL  30 mL Oral Daily PRN Loany Neuroth, Laurice Record, MD      . meloxicam (MOBIC) tablet 7.5 mg  7.5 mg Oral Daily Lenae Wherley, Laurice Record, MD      . menthol-cetylpyridinium (CEPACOL) lozenge 3 mg  1 lozenge Oral PRN Nathaly Dawkins, Laurice Record, MD       Or  . phenol (CHLORASEPTIC) mouth spray 1 spray  1 spray Mouth/Throat PRN Cobe Viney, Laurice Record, MD      . metoCLOPramide (REGLAN) tablet 10 mg  10 mg Oral TID AC & HS Tejon Gracie, Laurice Record, MD      . mometasone-formoterol (DULERA) 200-5 MCG/ACT inhaler 2 puff  2 puff Inhalation BID Dantae Meunier, Laurice Record, MD      . montelukast (SINGULAIR) tablet 10 mg  10 mg Oral QHS Khai Arrona, Laurice Record, MD      . morphine 2 MG/ML injection 2 mg  2 mg Intravenous Q2H PRN Charlayne Vultaggio, Laurice Record, MD      .  multivitamin with minerals tablet 1 tablet  1 tablet Oral Daily Aneesha Holloran, Laurice Record, MD      .  ondansetron (ZOFRAN) tablet 4 mg  4 mg Oral Q6H PRN Remmington Teters, Laurice Record, MD       Or  . ondansetron (ZOFRAN) injection 4 mg  4 mg Intravenous Q6H PRN Valorie Mcgrory, Laurice Record, MD   4 mg at 01/10/17 1325  . oxyCODONE (Oxy IR/ROXICODONE) immediate release tablet 5-10 mg  5-10 mg Oral Q4H PRN Daphine Loch, Laurice Record, MD      . pantoprazole (PROTONIX) EC tablet 40 mg  40 mg Oral BID Karlina Suares, Laurice Record, MD      . Derrill Memo ON 01/11/2017] predniSONE (DELTASONE) tablet 2.5 mg  2.5 mg Oral Daily Lounette Sloan, Laurice Record, MD      . senna-docusate (Senokot-S) tablet 1 tablet  1 tablet Oral BID Briell Paulette, Laurice Record, MD      . sodium chloride 0.9 % injection           . sodium phosphate (FLEET) 7-19 GM/118ML enema 1 enema  1 enema Rectal Once PRN Marnee Sherrard, Laurice Record, MD      . traMADol Veatrice Bourbon) tablet 50-100 mg  50-100 mg Oral Q4H PRN Jannet Calip, Laurice Record, MD         Discharge Medications: Please see discharge summary for a list of discharge medications.  Relevant Imaging Results:  Relevant Lab Results:   Additional Information  (SSN: [id number])  Sample, Veronia Beets, LCSW

## 2017-01-10 NOTE — Progress Notes (Signed)
PT Cancellation Note  Patient Details Name: Cindy Oconnor MRN: [id number] DOB: 05/01/1971   Cancelled Treatment:    Reason Eval/Treat Not Completed: Pain limiting ability to participate (Consult received and chart reviewed.  Per primary RN, advises hold at this time (until next date) to allow for improved pain control.  will re-attempt next date as medically appropriate.)   Bradrick Kamau H. Owens Shark, PT, DPT, NCS 01/10/17, 3:45 PM [phone]

## 2017-01-10 NOTE — Op Note (Signed)
OPERATIVE NOTE  DATE OF SURGERY:  01/10/2017  PATIENT NAME:  Cindy Oconnor   DOB: Apr 03, 1971  MRN: [id number]  PRE-OPERATIVE DIAGNOSIS: Degenerative arthrosis of the left knee, primary  POST-OPERATIVE DIAGNOSIS:  Same  PROCEDURE:  Left total knee arthroplasty using computer-assisted navigation  SURGEON:  Marciano Sequin. M.D.  ASSISTANT:  Vance Peper, PA (present and scrubbed throughout the case, critical for assistance with exposure, retraction, instrumentation, and closure)  ANESTHESIA: spinal  ESTIMATED BLOOD LOSS: 50 mL  FLUIDS REPLACED: 1750 mL of crystalloid  TOURNIQUET TIME: 103 minutes  DRAINS: 2 medium Hemovac drains  SOFT TISSUE RELEASES: Anterior cruciate ligament, posterior cruciate ligament, deep medial collateral ligament, patellofemoral ligament  IMPLANTS UTILIZED: DePuy PFC Sigma size 2 posterior stabilized femoral component (cemented), size 1.5 MBT tibial component (cemented), 32 mm 3 peg oval dome patella (cemented), and a 15 mm stabilized rotating platform polyethylene insert.  INDICATIONS FOR SURGERY: Cindy Oconnor is a 46 y.o. year old female with Down's syndrome and inflammatory arthritis who has a long history of progressive knee pain. X-rays demonstrated severe degenerative changes in tricompartmental fashion. The patient had not seen any significant improvement despite conservative nonsurgical intervention. After discussion of the risks and benefits of surgical intervention, the patient expressed understanding of the risks benefits and agree with plans for total knee arthroplasty.   The risks, benefits, and alternatives were discussed at length including but not limited to the risks of infection, bleeding, nerve injury, stiffness, blood clots, the need for revision surgery, cardiopulmonary complications, among others, and they were willing to proceed.  PROCEDURE IN DETAIL: The patient was brought into the operating room and, after adequate spinal  anesthesia was achieved, a tourniquet was placed on the patient's upper thigh. The patient's knee and leg were cleaned and prepped with alcohol and DuraPrep and draped in the usual sterile fashion. A "timeout" was performed as per usual protocol. The lower extremity was exsanguinated using an Esmarch, and the tourniquet was inflated to 300 mmHg. An anterior longitudinal incision was made followed by a standard mid vastus approach. The deep fibers of the medial collateral ligament were elevated in a subperiosteal fashion off of the medial flare of the tibia so as to maintain a continuous soft tissue sleeve. The patella was subluxed laterally and the patellofemoral ligament was incised. Inspection of the knee demonstrated severe degenerative changes with full-thickness loss of articular cartilage. Osteophytes were debrided using a rongeur. Anterior and posterior cruciate ligaments were excised. Two 4.0 mm Schanz pins were inserted in the femur and into the tibia for attachment of the array of trackers used for computer-assisted navigation. Hip center was identified using a circumduction technique. Distal landmarks were mapped using the computer. The distal femur and proximal tibia were mapped using the computer. The distal femoral cutting guide was positioned using computer-assisted navigation so as to achieve a 5 distal valgus cut. The femur was sized and it was felt that a size 2 femoral component was appropriate. A size 2 femoral cutting guide was positioned and the anterior cut was performed and verified using the computer. This was followed by completion of the posterior and chamfer cuts. Femoral cutting guide for the central box was then positioned in the center box cut was performed.  Attention was then directed to the proximal tibia. Medial and lateral menisci were excised. The extramedullary tibial cutting guide was positioned using computer-assisted navigation so as to achieve a 0 varus-valgus alignment  and 0 posterior slope. The cut was  performed and verified using the computer. The proximal tibia was sized and it was felt that a size 1.5 tibial tray was appropriate. Tibial and femoral trials were inserted followed by insertion of a 15 mm polyethylene insert. This allowed for excellent mediolateral soft tissue balancing both in flexion and in full extension. Finally, the patella was cut and prepared so as to accommodate a 32 mm 3 peg oval dome patella. A patella trial was placed and the knee was placed through a range of motion with excellent patellar tracking appreciated. The femoral trial was removed after debridement of posterior osteophytes. The central post-hole for the tibial component was reamed followed by insertion of a keel punch. Tibial trials were then removed. Cut surfaces of bone were irrigated with copious amounts of normal saline with antibiotic solution using pulsatile lavage and then suctioned dry. Polymethylmethacrylate cement with gentamicin was prepared in the usual fashion using a vacuum mixer. Cement was applied to the cut surface of the proximal tibia as well as along the undersurface of a size 1.5 MBT tibial component. Tibial component was positioned and impacted into place. Excess cement was removed using [employer] fast streamer. Cement was then applied to the cut surfaces of the femur as well as along the posterior flanges of the size 2 femoral component. The femoral component was positioned and impacted into place. Excess cement was removed using [employer] fast streamer. A 15 mm polyethylene trial was inserted and the knee was brought into full extension with steady axial compression applied. Finally, cement was applied to the backside of a 32 mm 3 peg oval dome patella and the patellar component was positioned and patellar clamp applied. Excess cement was removed using [employer] fast streamer. After adequate curing of the cement, the tourniquet was deflated after a total tourniquet time of 103 minutes.  Hemostasis was achieved using electrocautery. The knee was irrigated with copious amounts of normal saline with antibiotic solution using pulsatile lavage and then suctioned dry. 20 mL of 1.3% Exparel and 60 mL of 0.25% Marcaine in 40 mL of normal saline was injected along the posterior capsule, medial and lateral gutters, and along the arthrotomy site. A 15 mm stabilized rotating platform polyethylene insert was inserted and the knee was placed through a range of motion with excellent mediolateral soft tissue balancing appreciated and excellent patellar tracking noted. 2 medium drains were placed in the wound bed and brought out through separate stab incisions. The medial parapatellar portion of the incision was reapproximated using interrupted sutures of #1 Vicryl. Subcutaneous tissue was approximated in layers using first #0 Vicryl followed #2-0 Vicryl. The skin was approximated with skin staples. A sterile dressing was applied.  The patient tolerated the procedure well and was transported to the recovery room in stable condition.    James P. Holley Bouche., M.D.

## 2017-01-10 NOTE — Anesthesia Procedure Notes (Signed)
Spinal  Patient location during procedure: OR Start time: 01/10/2017 7:22 AM End time: 01/10/2017 7:27 AM Staffing Performed: resident/CRNA  Preanesthetic Checklist Completed: patient identified, site marked, surgical consent, pre-op evaluation, timeout performed, IV checked, risks and benefits discussed and monitors and equipment checked Spinal Block Patient position: sitting Prep: Betadine Patient monitoring: heart rate, continuous pulse ox, blood pressure and cardiac monitor Approach: midline Location: L4-5 Injection technique: single-shot Needle Needle type: Introducer and Pencan  Needle gauge: 24 G Needle length: 9 cm Additional Notes Negative paresthesia. Negative blood return. Positive free-flowing CSF. Expiration date of kit checked and confirmed. Patient tolerated procedure well, without complications.

## 2017-01-10 NOTE — Progress Notes (Signed)
Pt. A&O x4. VSS. Pt family in room. On 2L O2 by [place]. Pt. Appears comfortable at this time. No complaints of pain. Aware of dietary restrictions. Pt not happy with that would like "real food". Had some nausea and dry heaving. Zofran was given with relief.

## 2017-01-10 NOTE — Anesthesia Post-op Follow-up Note (Signed)
Anesthesia QCDR form completed.        

## 2017-01-10 NOTE — Transfer of Care (Signed)
Immediate Anesthesia Transfer of Care Note  Patient: Cindy Oconnor  Procedure(s) Performed: Procedure(s): COMPUTER ASSISTED TOTAL KNEE ARTHROPLASTY (Left)  Patient Location: PACU  Anesthesia Type:Spinal  Level of Consciousness: awake and alert   Airway & Oxygen Therapy: Patient Spontanous Breathing and Patient connected to face mask oxygen  Post-op Assessment: Report given to RN and Post -op Vital signs reviewed and stable  Post vital signs: Reviewed and stable  Last Vitals:  Vitals:   01/10/17 0624  BP: 107/75  Pulse: 80  Resp: 16  Temp: 36.9 C  SpO2: 95%    Last Pain:  Vitals:   01/10/17 0624  TempSrc: Oral  PainSc: 4          Complications: No apparent anesthesia complications

## 2017-01-10 NOTE — Clinical Social Work Note (Signed)
Clinical Social Work Assessment  Patient Details  Name: Cindy Oconnor MRN: [id number] Date of Birth: 10-29-1970  Date of referral:  01/10/17               Reason for consult:  Facility Placement                Permission sought to share information with:  Chartered certified accountant granted to share information::  Yes, Verbal Permission Granted  Name::      [place]::   [place]   Relationship::     Contact Information:     Housing/Transportation Living arrangements for the past 2 months:  [place] of Information:  Patient, Parent Patient Interpreter Needed:  None Criminal Activity/Legal Involvement Pertinent to Current Situation/Hospitalization:  No - Comment as needed Significant Relationships:  Parents Lives with:  Facility Resident Do you feel safe going back to the place where you live?  Yes Need for family participation in patient care:  Yes (Comment)  Care giving concerns:  Patient is a resident at [facility] group home and had a planned knee surgery with Dr. Marry Guan today.    Social Worker assessment / plan:  Holiday representative (CSW) received SNF consult. CSW is familiar with patient and her mother/ guardian [name] [phone] from previous admissions and joint class. Patient was alert and oriented X4 and was laying in the bed. Patient's mother/ guardian was in the chair at bedside. CSW introduced self and explained role of CSW department. Plan is for patient to go to SNF for short term rehab because [facility] doesn't have the staff or PT that patient will require post op. Patient's mother is agreeable to SNF search in [facility] and prefers [facility] or [facility]. CSW explained that medicare will pay for SNF if patient has a 3 night qualifying inpatient stay at a hospital. Patient was admitted to inpatient on today 01/10/17 and will be able to D/C on Thursday 01/13/17. Patient's mother verbalized her  understanding. FL2 complete and faxed out. PASARR is pending. CSW will continue to follow and assist as needed.   Employment status:  Disabled (Comment on whether or not currently receiving Disability) Insurance information:  Medicare, Medicaid In [place] PT Recommendations:  Not assessed at this time Information / Referral to community resources:  [place]  Patient/Family's Response to care:  Patient and her mother/ guardian are agreeable to SNF search in [place].   Patient/Family's Understanding of and Emotional Response to Diagnosis, Current Treatment, and Prognosis:  Patient and her mother were very pleasant and thanked CSW for assistance.   Emotional Assessment Appearance:  Appears stated age Attitude/Demeanor/Rapport:    Affect (typically observed):  Accepting, Adaptable, Pleasant Orientation:  Oriented to Self, Oriented to Place, Oriented to  Time, Oriented to Situation, Fluctuating Orientation (Suspected and/or reported Sundowners) Alcohol / Substance use:  Not Applicable Psych involvement (Current and /or in the community):  No (Comment)  Discharge Needs  Concerns to be addressed:  Discharge Planning Concerns Readmission within the last 30 days:  No Current discharge risk:  Dependent with Mobility Barriers to Discharge:  Continued Medical Work up   [employer], Veronia Beets, LCSW 01/10/2017, 3:33 PM

## 2017-01-10 NOTE — Anesthesia Preprocedure Evaluation (Signed)
Anesthesia Evaluation  Patient identified by MRN, date of birth, ID band Patient awake    Reviewed: Allergy & Precautions, NPO status , Patient's Chart, lab work & pertinent test results  Airway Mallampati: IV  TM Distance: <3 FB     Dental  (+) Chipped, Poor Dentition   Pulmonary asthma ,    Pulmonary exam normal        Cardiovascular negative cardio ROS Normal cardiovascular exam     Neuro/Psych negative neurological ROS  negative psych ROS   GI/Hepatic PUD, GERD  Medicated,  Endo/Other  Hypothyroidism   Renal/GU   negative genitourinary   Musculoskeletal  (+) Arthritis , Osteoarthritis and Rheumatoid disorders,    Abdominal Normal abdominal exam  (+)   Peds negative pediatric ROS (+)  Hematology   Anesthesia Other Findings Past Medical History: No date: Arthritis No date: Asthma No date: Blood clot in vein No date: Chicken pox 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 No date: Down syndrome No date: GERD (gastroesophageal reflux disease) No date: Hypothyroidism 1995: Lupus No date: RA (rheumatoid arthritis) (HCC) No date: Ulcerative colitis (HCC)  Reproductive/Obstetrics                             Anesthesia Physical Anesthesia Plan  ASA: III  Anesthesia Plan: Spinal   Post-op Pain Management:    Induction: Intravenous  PONV Risk Score and Plan:   Airway Management Planned: Nasal Cannula  Additional Equipment:   Intra-op Plan:   Post-operative Plan:   Informed Consent: I have reviewed the patients History and Physical, chart, labs and discussed the procedure including the risks, benefits and alternatives for the proposed anesthesia with the patient or authorized representative who has indicated his/her understanding and acceptance.   Dental advisory given  Plan Discussed with: Surgeon and CRNA  Anesthesia Plan Comments:         Anesthesia Quick Evaluation

## 2017-01-10 NOTE — H&P (Signed)
The patient has been re-examined, and the chart reviewed, and there have been no interval changes to the documented history and physical.    The risks, benefits, and alternatives have been discussed at length. The patient expressed understanding of the risks benefits and agreed with plans for surgical intervention.  James P. Hooten, Jr. M.D.    

## 2017-01-10 NOTE — Clinical Social Work Placement (Signed)
   CLINICAL SOCIAL WORK PLACEMENT  NOTE  Date:  01/10/2017  Patient Details  Name: Cindy Oconnor MRN: [id number] Date of Birth: [birth year]  Clinical Social Work is seeking post-discharge placement for this patient at the [place] level of care (*CSW will initial, date and re-position this form in  chart as items are completed):  Yes   Patient/family provided with [place] Work Department's list of facilities offering this level of care within the geographic area requested by the patient (or if unable, by the patient's family).  Yes   Patient/family informed of their freedom to choose among providers that offer the needed level of care, that participate in Medicare, Medicaid or managed care program needed by the patient, have an available bed and are willing to accept the patient.  Yes   Patient/family informed of [facility]'s ownership interest in [facility] and [facility], as well as of the fact that they are under no obligation to receive care at these facilities.  PASRR submitted to EDS on 01/10/17     PASRR number received on       Existing PASRR number confirmed on       FL2 transmitted to all facilities in geographic area requested by pt/family on 01/10/17     FL2 transmitted to all facilities within larger geographic area on       Patient informed that his/her managed care company has contracts with or will negotiate with certain facilities, including the following:            Patient/family informed of bed offers received.  Patient chooses bed at       Physician recommends and patient chooses bed at      Patient to be transferred to   on  .  Patient to be transferred to facility by       Patient family notified on   of transfer.  Name of family member notified:        PHYSICIAN       Additional Comment:    _______________________________________________ Derrick Tiegs, Veronia Beets, LCSW 01/10/2017, 3:26 PM

## 2017-01-11 LAB — BASIC METABOLIC PANEL
Anion gap: 5 (ref 5–15)
BUN: 8 mg/dL (ref 6–20)
CALCIUM: 7.8 mg/dL — AB (ref 8.9–10.3)
CO2: 30 mmol/L (ref 22–32)
Chloride: 105 mmol/L (ref 101–111)
Creatinine, Ser: 0.69 mg/dL (ref 0.44–1.00)
GFR calc Af Amer: >60 mL/min (ref >60)
GLUCOSE: 137 mg/dL — AB (ref 65–99)
Potassium: 4.1 mmol/L (ref 3.5–5.1)
Sodium: 140 mmol/L (ref 135–145)

## 2017-01-11 LAB — CBC
HCT: 33.4 % — ABNORMAL LOW (ref 35.0–47.0)
Hemoglobin: 11.1 g/dL — ABNORMAL LOW (ref 12.0–16.0)
MCH: 34.1 pg — AB (ref 26.0–34.0)
MCHC: 33.1 g/dL (ref 32.0–36.0)
MCV: 103.1 fL — AB (ref 80.0–100.0)
PLATELETS: 292 10*3/uL (ref 150–440)
RBC: 3.24 MIL/uL — ABNORMAL LOW (ref 3.80–5.20)
RDW: 17.5 % — AB (ref 11.5–14.5)
WBC: 11.5 10*3/uL — AB (ref 3.6–11.0)

## 2017-01-11 MED ORDER — CALCIUM CARBONATE-VITAMIN D 500-200 MG-UNIT PO TABS
3.0000 | ORAL_TABLET | Freq: Every day | ORAL | Status: DC
Start: 2017-01-11 — End: 2017-01-13
  Administered 2017-01-11 – 2017-01-12 (×2): 3 via ORAL
  Filled 2017-01-11 (×3): qty 3

## 2017-01-11 NOTE — Discharge Instructions (Signed)

## 2017-01-11 NOTE — Evaluation (Signed)
Occupational Therapy Evaluation Patient Details Name: Cindy Oconnor MRN: [id number] DOB: [birth year] Today's Date: 01/11/2017    History of Present Illness 46 y.o.year old femalept POD#1 s/p L TKA. PMHx significant for OA, chronic ulcerative colitis, asthma, RA, GERD, and Down'ssyndrome.    Clinical Impression   Pt seen for OT evaluation this date. Pt was modified independent using a rollator prior to admission, requiring assist from group home for medication mgt, meals, cleaning, and occasionally assist for seated shower. No falls in past 12 months. Pt's mother and two caregivers from group home present for session today. Pt presents with significant abdominal pain and N/V during session, limiting assessment of functional mobility and self care skills this date. Pt's mother notes pt is likely impacted/constipated, as she was previously admitted with similar N/V for this reason, and requesting suppository for pt. RN notified of pain, N/V, and request for suppository. BP 126/103, 113/54 while in bed with HOB elevated. Pt able to perform grooming tasks with set up in bed. Pt currently requiring max assist for LB ADL tasks at bed level due to deficits in pain, ROM, strength, activity tolerance, and decreased knowledge of AE/DME. Pt will benefit from skilled OT services to address noted deficits and impairments in order to maximize return to PLOF and minimize risk of falls and increased caregiver burden. Recommend STR following hospitalization.    Follow Up Recommendations  SNF    Equipment Recommendations  3 in 1 bedside commode    Recommendations for Other Services       Precautions / Restrictions Precautions Precautions: Fall Required Braces or Orthoses: Knee Immobilizer - Left Knee Immobilizer - Left: Discontinue once straight leg raise with < 10 degree lag Restrictions Weight Bearing Restrictions: Yes LLE Weight Bearing: Weight bearing as tolerated      Mobility Bed Mobility               General bed mobility comments: deferred, pt with N/V and abdominal pain  Transfers                 General transfer comment: deferred, pt with N/V and abdominal pain    Balance                                           ADL either performed or assessed with clinical judgement   ADL Overall ADL's : Needs assistance/impaired Eating/Feeding: Sitting;Set up   Grooming: Sitting;Set up;Wash/dry face;Oral care Grooming Details (indicate cue type and reason): pt performed oral care and washed face with washcloth with set up. Upper Body Bathing: Minimal assistance;Bed level   Lower Body Bathing: Maximal assistance;Bed level   Upper Body Dressing : Minimal assistance;Bed level   Lower Body Dressing: Maximal assistance;Bed level     Toilet Transfer Details (indicate cue type and reason): unable to assess           General ADL Comments: pt currently max assist for LB ADL, limited this date by N/V and abdominal pain     Vision Baseline Vision/History: Wears glasses Wears Glasses: At all times Patient Visual Report: No change from baseline Vision Assessment?: No apparent visual deficits     Perception     Praxis      Pertinent Vitals/Pain Pain Assessment: Faces Faces Pain Scale: Hurts whole lot Pain Location: Pt reported "a little pain" in L knee, but "a lot" of abdominal pain,  followed by nausea and vomiting  Pain Descriptors / Indicators: Grimacing;Guarding;Aching Pain Intervention(s): Other (comment);Premedicated before session;Monitored during session;Limited activity within patient's tolerance (cool washcloth applied to forehead, RN notified of N/V)     Hand Dominance     Extremity/Trunk Assessment Upper Extremity Assessment Upper Extremity Assessment: Overall WFL for tasks assessed;Generalized weakness (grossly 4/5 bilaterally)   Lower Extremity Assessment Lower Extremity Assessment: Defer to PT evaluation (unable to  assess due to pt's N/V)       Communication Communication Communication: No difficulties   Cognition Arousal/Alertness: Awake/alert Behavior During Therapy: WFL for tasks assessed/performed Overall Cognitive Status: Within Functional Limits for tasks assessed                                 General Comments: hx of Down's syndrome; pt alert and oriented, follows commands, appropriate throughout session   General Comments       Exercises     Shoulder Instructions      Home Living Family/patient expects to be discharged to:: Skilled nursing facility                                 Additional Comments: Pt lives in group home      Prior Functioning/Environment Level of Independence: Needs assistance  Gait / Transfers Assistance Needed: ambulating with rollator independently ADL's / Homemaking Assistance Needed: Pt required occasional assist for seated showers; group home provides medication mgt, meals, and cleaning. Pt enjoys watching movies, playing on her computer tablet, and dancing.   Comments: Pt/family/caregivers deny any falls in past 12 months.        OT Problem List: Decreased strength;Pain;Decreased range of motion;Decreased activity tolerance;Impaired balance (sitting and/or standing);Decreased knowledge of use of DME or AE;Decreased safety awareness      OT Treatment/Interventions: Self-care/ADL training;Therapeutic exercise;Therapeutic activities;DME and/or AE instruction;Patient/family education    OT Goals(Current goals can be found in the care plan section) Acute Rehab OT Goals Patient Stated Goal: feel better OT Goal Formulation: With patient/family Time For Goal Achievement: 01/25/17 Potential to Achieve Goals: Good  OT Frequency: Min 2X/week   Barriers to D/C: Decreased caregiver support          Co-evaluation              AM-PAC PT "6 Clicks" Daily Activity     Outcome Measure Help from another person eating  meals?: None Help from another person taking care of personal grooming?: None Help from another person toileting, which includes using toliet, bedpan, or urinal?: A Lot Help from another person bathing (including washing, rinsing, drying)?: A Lot Help from another person to put on and taking off regular upper body clothing?: A Little Help from another person to put on and taking off regular lower body clothing?: A Lot 6 Click Score: 17   End of Session Nurse Communication: Other (comment) (notified of abdominal pain, N/V, request from pt's mother for suppository )  Activity Tolerance: Treatment limited secondary to medical complications (Comment);Patient limited by pain (pt limited by N/V, abdominal pain) Patient left: in bed;with call bell/phone within reach;with family/visitor present;with SCD's reapplied;Other (comment) (polar care in place, PT in room to assess)  OT Visit Diagnosis: Other abnormalities of gait and mobility (R26.89);Muscle weakness (generalized) (M62.81);Pain Pain - Right/Left: Left Pain - part of body: Knee (and abdomen)  Time: 0912-0950 OT Time Calculation (min): 38 min Charges:  OT General Charges $OT Visit: 1 Procedure OT Evaluation $OT Eval Moderate Complexity: 1 Procedure OT Treatments $Self Care/Home Management : 8-22 mins G-Codes: OT G-codes **NOT FOR INPATIENT CLASS** Functional Assessment Tool Used: AM-PAC 6 Clicks Daily Activity;Clinical judgement Functional Limitation: Self care Self Care Current Status (N3750): At least 40 percent but less than 60 percent impaired, limited or restricted Self Care Goal Status (R1071): At least 20 percent but less than 40 percent impaired, limited or restricted   Jeni Salles, MPH, MS, OTR/L ascom [phone] 01/11/17, 10:28 AM

## 2017-01-11 NOTE — Progress Notes (Addendum)
PASARR has been received [id number] E, expires on 02/10/2017. Clinical Social Worker (CSW) met with patient and her mother/guardian [name] was at bedside. CSW presented bed offers. Mother prefers a private room. Per [name] admissions coordinator at [facility] they can offer patient a private room on Thursday 01/13/17. Patient's mother [name] accepted private room from [name].   Plan is for patient to D/C to [facility] on Thursday pending medical clearance. [name] admissions coordinator at [facility] is aware of above. Patient's mother is aware of above. CSW will continue to follow and assist as needed.   [employer], LCSW [phone]

## 2017-01-11 NOTE — Discharge Summary (Signed)
Physician Discharge Summary  Patient ID: Cindy Oconnor MRN: [id number] DOB/AGE: [birth year] 48 y.o.  Admit date: 01/10/2017 Discharge date: 01/13/2017  Admission Diagnoses:  OSTEOARTHRITIS LEFT KNEE   Discharge Diagnoses: Patient Active Problem List   Diagnosis Date Noted  . S/P total knee arthroplasty 01/10/2017  . Macrocytosis without anemia 01/08/2017  . Preoperative clearance 01/08/2017  . Moderate persistent asthma with acute exacerbation 09/14/2016  . Chronic ulcerative colitis ([place]) 09/26/2015  . Dysphagia 08/26/2015  . Osteoarthritis 01/30/2014  . Osteoporosis 01/30/2014  . Mild tricuspid regurgitation 02/13/2013  . Routine general medical examination at a health care facility 01/24/2013  . Obesity (BMI 30-39.9) 01/21/2012  . Recurrent boils 03/12/2009  . Rheumatoid arthritis ([place]) 06/11/2006  . HYSTERECTOMY, HX OF 06/11/2006  . Hypothyroid 03/18/2006  . Asthma, chronic 03/18/2006  . GERD 03/18/2006  . Down's syndrome 03/18/2006    Past Medical History:  Diagnosis Date  . Arthritis   . Asthma   . Blood clot in vein   . Chicken pox   . Deafness in left ear   . Down syndrome   . GERD (gastroesophageal reflux disease)   . Hypothyroidism   . Lupus 1995  . RA (rheumatoid arthritis) ([place])   . Ulcerative (chronic) enterocolitis ([place])   . Ulcerative colitis ([place])      Transfusion: No transfusions during this admission   Consultants (if any):   Discharged Condition: Improved  Hospital Course: Cindy Oconnor is an 46 y.o. female who was admitted 01/10/2017 with a diagnosis of degenerative arthrosis left knee and went to the operating room on 01/10/2017 and underwent the above named procedures.    Surgeries:Procedure(s): COMPUTER ASSISTED TOTAL KNEE ARTHROPLASTY on 01/10/2017  PRE-OPERATIVE DIAGNOSIS: Degenerative arthrosis of the left knee, primary  POST-OPERATIVE DIAGNOSIS:  Same  PROCEDURE:  Left total knee arthroplasty using computer-assisted  navigation  SURGEON:  Marciano Sequin. M.D.  ASSISTANT:  Vance Peper, PA (present and scrubbed throughout the case, critical for assistance with exposure, retraction, instrumentation, and closure)  ANESTHESIA: spinal  ESTIMATED BLOOD LOSS: 50 mL  FLUIDS REPLACED: 1750 mL of crystalloid  TOURNIQUET TIME: 103 minutes  DRAINS: 2 medium Hemovac drains  SOFT TISSUE RELEASES: Anterior cruciate ligament, posterior cruciate ligament, deep medial collateral ligament, patellofemoral ligament  IMPLANTS UTILIZED: DePuy PFC Sigma size 2 posterior stabilized femoral component (cemented), size 1.5 MBT tibial component (cemented), 32 mm 3 peg oval dome patella (cemented), and a 15 mm stabilized rotating platform polyethylene insert.  INDICATIONS FOR SURGERY: Cindy Oconnor is a 46 y.o. year old female with Down's syndrome and inflammatory arthritis who has a long history of progressive knee pain. X-rays demonstrated severe degenerative changes in tricompartmental fashion. The patient had not seen any significant improvement despite conservative nonsurgical intervention. After discussion of the risks and benefits of surgical intervention, the patient expressed understanding of the risks benefits and agree with plans for total knee arthroplasty.   The risks, benefits, and alternatives were discussed at length including but not limited to the risks of infection, bleeding, nerve injury, stiffness, blood clots, the need for revision surgery, cardiopulmonary complications, among others, and they were willing to proceed.  Patient tolerated the surgery well. No complications .Patient was taken to PACU where she was stabilized and then transferred to the orthopedic floor.  Patient started on Lovenox 30 mg q 12 hrs. Foot pumps applied bilaterally at 80 mm hgb. Heels elevated off bed with rolled towels. No evidence of DVT. Calves non tender. Negative [place]. Physical  therapy started on day #1 for  gait training and transfer with OT starting on  day #1 for ADL and assisted devices. Patient has done well with therapy. Ambulated 30 feet upon being discharged.  Patient's IV And Foley were discontinued on day #1 with Hemovac being discontinued on day #2. Dressing was changed on day 2 prior to patient being discharged   She was given perioperative antibiotics:  Anti-infectives    Start     Dose/Rate Route Frequency Ordered Stop   01/10/17 1400  ceFAZolin (ANCEF) IVPB 2g/100 mL premix     2 g 200 mL/hr over 30 Minutes Intravenous Every 6 hours 01/10/17 1304 01/11/17 1359   01/10/17 0748  ceFAZolin (ANCEF) 2-4 GM/100ML-% IVPB    Comments:  Sylvester Harder   : cabinet override      01/10/17 0748 01/10/17 0742   01/10/17 0600  ceFAZolin (ANCEF) IVPB 2g/100 mL premix     2 g 200 mL/hr over 30 Minutes Intravenous On call to O.R. 01/09/17 2222 01/10/17 2707    .  She was fitted with AV 1 compression foot pump devices, instructed on heel pumps, early ambulation, and fitted with TED stockings bilaterally for DVT prophylaxis.  She benefited maximally from the hospital stay and there were no complications.    Recent vital signs:  Vitals:   01/10/17 1947 01/10/17 2347  BP: (!) 95/57 105/60  Pulse: 73 (!) 56  Resp: 19 19  Temp: 97.8 F (36.6 C) 98.1 F (36.7 C)  SpO2: 100% 98%    Recent laboratory studies:  Lab Results  Component Value Date   HGB 11.1 (L) 01/11/2017   HGB 14.0 01/05/2017   HGB 12.8 12/31/2016   Lab Results  Component Value Date   WBC 11.5 (H) 01/11/2017   PLT 292 01/11/2017   Lab Results  Component Value Date   INR 1.03 12/29/2016   Lab Results  Component Value Date   NA 140 01/11/2017   K 4.1 01/11/2017   CL 105 01/11/2017   CO2 30 01/11/2017   BUN 8 01/11/2017   CREATININE 0.69 01/11/2017   GLUCOSE 137 (H) 01/11/2017    Discharge Medications:   Allergies as of 01/13/2017      Reactions   Erythromycin Other (See Comments), Rash, Shortness Of Breath    Reaction:  Unknown  Reaction:  Unknown    Albuterol Other (See Comments), Rash   Reaction:Makes pt jittery  "Jittery" Reaction:  Makes pt jittery       Medication List    STOP taking these medications   meloxicam 7.5 MG tablet Commonly known as:  MOBIC     TAKE these medications   alendronate 70 MG tablet Commonly known as:  FOSAMAX Take 1 tablet (70 mg total) by mouth once a week. Take with a full glass of water on an empty stomach. What changed:  when to take this  additional instructions   benzonatate 100 MG capsule Commonly known as:  TESSALON Take 1 capsule (100 mg total) by mouth 3 (three) times daily. For 7 days,  Then every 8 hours as needed   bisacodyl 5 MG EC tablet Commonly known as:  DULCOLAX Take 1 tablet (5 mg total) by mouth at bedtime.   budesonide-formoterol 160-4.5 MCG/ACT inhaler Commonly known as:  SYMBICORT Inhale 2 puffs into the lungs 2 (two) times daily. What changed:  additional instructions   calcium carbonate 1500 (600 Ca) MG Tabs tablet Commonly known as:  OSCAL Take 1 tablet (1,500  mg total) by mouth daily.   carbamide peroxide 6.5 % OTIC solution Commonly known as:  DEBROX Place 5 drops into both ears as needed (to remove earwax).   cholecalciferol 1000 units tablet Commonly known as:  VITAMIN D Take 1 tablet (1,000 Units total) by mouth daily. What changed:  additional instructions   enoxaparin 30 MG/0.3ML injection Commonly known as:  LOVENOX Inject 0.3 mLs (30 mg total) into the skin every 12 (twelve) hours.   folic acid 1 MG tablet Commonly known as:  FOLVITE TAKE ONE TABLET BY MOUTH ONCE A DAY. (FOLIC ACID SUPPLEMENT) What changed:  See the new instructions.   gentamicin ointment 0.1 % Commonly known as:  GARAMYCIN Apply 1 application topically 3 (three) times daily as needed (for rash/boils).   guaifenesin 400 MG Tabs tablet Commonly known as:  HUMIBID E Take 400 mg by mouth every 4 (four) hours as needed (for  congestion).   guaiFENesin 100 MG/5ML liquid Commonly known as:  ROBITUSSIN Take 100 mg by mouth every 4 (four) hours as needed (for cough or to loosen phlegm).   ketoconazole 2 % cream Commonly known as:  NIZORAL Apply 1 application topically daily. What changed:  additional instructions   ketotifen 0.025 % ophthalmic solution Commonly known as:  ZADITOR Place 1 drop into both eyes 2 (two) times daily as needed (for allergiesgu).   lactulose 10 GM/15ML solution Commonly known as:  CHRONULAC Take 30 g by mouth daily as needed for mild constipation.   levalbuterol 0.63 MG/3ML nebulizer solution Commonly known as:  XOPENEX Take 3 mLs (0.63 mg total) by nebulization 3 (three) times daily as needed for wheezing or shortness of breath.   levothyroxine 125 MCG tablet Commonly known as:  SYNTHROID, LEVOTHROID TAKE ONE TABLET BY MOUTH BEFORE BREAKFAST What changed:  See the new instructions.   loratadine 10 MG tablet Commonly known as:  CLARITIN Take 1 tablet (10 mg total) by mouth daily. What changed:  additional instructions   methotrexate 2.5 MG tablet Commonly known as:  RHEUMATREX Take 7 tablets (17.5 mg total) by mouth once a week. Caution:Chemotherapy. Protect from light. What changed:  when to take this  additional instructions   montelukast 10 MG tablet Commonly known as:  SINGULAIR Take 1 tablet (10 mg total) by mouth at bedtime. What changed:  when to take this   omeprazole 20 MG capsule Commonly known as:  PRILOSEC Take 1 capsule (20 mg total) by mouth 2 (two) times daily. What changed:  additional instructions   ondansetron 4 MG tablet Commonly known as:  ZOFRAN Take 4 mg by mouth every 8 (eight) hours as needed for nausea or vomiting.   ONE-A-DAY VITACRAVES Chew Two chewables daily What changed:  how much to take  how to take this  when to take this  additional instructions   predniSONE 2.5 MG tablet Commonly known as:  DELTASONE TAKE 1  TABLET BY MOUTH DAILY *STARTING AFTER TAPER COMPLETED* What changed:  See the new instructions.   predniSONE 10 MG tablet Commonly known as:  DELTASONE Take 4 tablets ( total 40 mg) by mouth for 2 days; take 3 tablets ( total 30 mg) by mouth for 2 days; take 2 tablets ( total 20 mg) by mouth for 1 day; take 1 tablet ( total 10 mg) by mouth for 1 day. What changed:  Another medication with the same name was changed. Make sure you understand how and when to take each.   senna 8.6 MG tablet  Commonly known as:  SENEXON Take 1 tablet (8.6 mg total) by mouth at bedtime.   STOOL SOFTENER 100 MG capsule Generic drug:  docusate sodium TAKE 1 CAPSULE BY MOUTH EVERY OTHER DAY   traMADol 50 MG tablet Commonly known as:  ULTRAM Take 1-2 tablets (50-100 mg total) by mouth every 4 (four) hours as needed for moderate pain.            Durable Medical Equipment        Start     Ordered   01/10/17 1305  DME Walker rolling  Once    Question:  Patient needs a walker to treat with the following condition  Answer:  Total knee replacement status   01/10/17 1304   01/10/17 1305  DME Bedside commode  Once    Question:  Patient needs a bedside commode to treat with the following condition  Answer:  Total knee replacement status   01/10/17 1304      Diagnostic Studies: Dg Chest 2 View  Result Date: 12/31/2016 CLINICAL DATA:  Shortness of breath. EXAM: CHEST  2 VIEW COMPARISON:  12/28/2016 . FINDINGS: Mediastinum and hilar structures are normal . Heart size normal. No focal infiltrate. Mild bilateral pleural thickening noted consistent scarring. No pneumothorax . IMPRESSION: 1.  No acute cardiopulmonary disease. 2.  Mild bilateral pleural thickening consistent with scarring. Electronically Signed   By: Marcello Moores  Register   On: 12/31/2016 14:59   Dg Chest 2 View  Result Date: 12/28/2016 CLINICAL DATA:  46 year old female with shortness of breath EXAM: CHEST  2 VIEW COMPARISON:  Prior chest x-ray  09/14/2016 FINDINGS: Stable cardiac and mediastinal contours. Mild bronchitic change and interstitial prominence appears similar compared to prior. No new airspace opacity, pulmonary edema or pneumothorax. Opacity projecting over the posterior costophrenic sulcus on the lateral view is similar compared to prior. Correlation with prior CT imaging demonstrates fatty infiltration in the subpleural space. This is unlikely to represent a pleural effusion. No acute osseous abnormality. IMPRESSION: No active cardiopulmonary disease. Electronically Signed   By: Jacqulynn Cadet M.D.   On: 12/28/2016 15:32   Dg Knee Left Port  Result Date: 01/10/2017 CLINICAL DATA:  Status post left total hip joint prosthesis placement. Immediate postoperative radiographs. EXAM: PORTABLE LEFT KNEE - 1-2 VIEW COMPARISON:  None in PACs FINDINGS: The patient has undergone left total knee joint prosthesis placement. Radiographic positioning of the prosthetic components is good. Surgical drain lines and skin staples are present. IMPRESSION: No immediate postprocedure complication following left total knee joint prosthesis placement. Electronically Signed   By: David  Martinique M.D.   On: 01/10/2017 11:40    Disposition: 01-Home or Self Care    Follow-up Information    Watt Climes, PA On 01/25/2017.   Specialty:  Physician Assistant Why:  at 10:15am Contact information: [place] [address] [phone]        Dereck Leep, MD On 02/22/2017.   Specialty:  Orthopedic Surgery Why:  at 2:15pm Contact information: [place] [address] [phone]            Signed: Watt Climes 01/11/2017, 7:28 AM

## 2017-01-11 NOTE — Progress Notes (Signed)
RN offered for pharmacy to come and go over all mediations with pt's mother. She declined at this time, stating she is "just emotional due to the loss of her brother two years ago from bronchitis", stated he also had Down Syndrome and is very protective over her daughter. RN told her that if she changes her mind or has any further questions regarding medications to let her know. Pt's mother acknowledged this and agreed.   Bethann Punches, RN

## 2017-01-11 NOTE — Care Management (Signed)
RNCM consult received. I introduced myself and my role at [facility] to patient and mother. Patient resides at [facility]. CSW and RNCM will continue to follow.

## 2017-01-11 NOTE — Anesthesia Postprocedure Evaluation (Signed)
Anesthesia Post Note  Patient: Cindy Oconnor  Procedure(s) Performed: Procedure(s) (LRB): COMPUTER ASSISTED TOTAL KNEE ARTHROPLASTY (Left)  Patient location during evaluation: Other Anesthesia Type: Spinal Level of consciousness: oriented and awake and alert Pain management: pain level controlled Vital Signs Assessment: post-procedure vital signs reviewed and stable Respiratory status: spontaneous breathing, respiratory function stable and patient connected to nasal cannula oxygen Cardiovascular status: blood pressure returned to baseline and stable Postop Assessment: no headache and no backache Anesthetic complications: no     Last Vitals:  Vitals:   01/10/17 2347 01/11/17 0735  BP: 105/60 (!) 80/36  Pulse: (!) 56 (!) 55  Resp: 19 18  Temp: 36.7 C 36.6 C  SpO2: 98% 97%    Last Pain:  Vitals:   01/11/17 0837  TempSrc:   PainSc: 7                  Alison Stalling

## 2017-01-11 NOTE — Progress Notes (Signed)
Alert and oriented. VSS. Pt tolerated sitting on side of bed. Foley discontinued this am. Mom at bedside. Encouraged to use incentive spirometer. Pt could not tolerate bone foam

## 2017-01-11 NOTE — Progress Notes (Signed)
   Subjective: 1 Day Post-Op Procedure(s) (LRB): COMPUTER ASSISTED TOTAL KNEE ARTHROPLASTY (Left) Patient reports pain as 6 on 0-10 scale.   Patient is well, and has had no acute complaints or problems We will start therapy today. Patient set at the side of the bed with the nurse and dangle legs. Patient also refusing bone foam under the operative leg. Plan is to go Rehab after hospital stay. no nausea and no vomiting Patient denies any chest pains or shortness of breath.  Objective: Vital signs in last 24 hours: Temp:  [97.2 F (36.2 C)-98.1 F (36.7 C)] 98.1 F (36.7 C) (08/13 2347) Pulse Rate:  [55-103] 56 (08/13 2347) Resp:  [11-20] 19 (08/13 2347) BP: (79-133)/(39-93) 105/60 (08/13 2347) SpO2:  [89 %-100 %] 98 % (08/13 2347) Weight:  [84.7 kg (186 lb 11.2 oz)] 84.7 kg (186 lb 11.2 oz) (08/13 1250) Heels are non tender and elevated off the bed using rolled towels  Intake/Output from previous day: 08/13 0701 - 08/14 0700 In: 4321.7 [P.O.:1040; I.V.:3081.7; IV Piggyback:200] Out: 3980 [Urine:3850; Drains:80; Blood:50] Intake/Output this shift: No intake/output data recorded.   Recent Labs  01/11/17 0423  HGB 11.1*    Recent Labs  01/11/17 0423  WBC 11.5*  RBC 3.24*  HCT 33.4*  PLT 292    Recent Labs  01/11/17 0423  NA 140  K 4.1  CL 105  CO2 30  BUN 8  CREATININE 0.69  GLUCOSE 137*  CALCIUM 7.8*   No results for input(s): LABPT, INR in the last 72 hours.  EXAM General - Patient is Alert, Appropriate and Oriented Extremity - Neurologically intact Neurovascular intact Sensation intact distally Intact pulses distally Dorsiflexion/Plantar flexion intact Compartment soft Dressing - dressing C/D/I Motor Function - intact, moving foot and toes well on exam.    Past Medical History:  Diagnosis Date  . Arthritis   . Asthma   . Blood clot in vein   . Chicken pox   . Deafness in left ear   . Down syndrome   . GERD (gastroesophageal reflux  disease)   . Hypothyroidism   . Lupus 1995  . RA (rheumatoid arthritis) ([place])   . Ulcerative (chronic) enterocolitis ([place])   . Ulcerative colitis ([place])     Assessment/Plan: 1 Day Post-Op Procedure(s) (LRB): COMPUTER ASSISTED TOTAL KNEE ARTHROPLASTY (Left) Active Problems:   S/P total knee arthroplasty  Estimated body mass index is 40.4 kg/m as calculated from the following:   Height as of this encounter: 4' 9"  (1.448 m).   Weight as of this encounter: 84.7 kg (186 lb 11.2 oz). Advance diet Up with therapy D/C IV fluids Plan for discharge tomorrow Discharge to SNF  Labs: Were reviewed. Hemoglobin 11.5 DVT Prophylaxis - Lovenox, Foot Pumps and TED hose Weight-Bearing as tolerated to left leg D/C O2 and Pulse OX and try on Room Air Begin working on bowel movement Labs tomorrow morning  Cindy Oconnor Cindy Oconnor 01/11/2017, 7:21 AM

## 2017-01-11 NOTE — Progress Notes (Signed)
BP running soft this morning. Pt is asymptomatic. Vance Peper notified and will recheck in one hour.   Bethann Punches, RN

## 2017-01-11 NOTE — Progress Notes (Signed)
Suppository given per request of pt's mother. Pt's mother upset stating she was given albuterol this morning and she is allergic to this. RN explained that the inhaler that was given does not contain albuterol. Mother still upset despite this. Stated "someone gave it to her last night". RN reassured her that the [facility] inhaler is all she has ordered. She stated she does not want this inhaler to be given any more.   Bethann Punches, RN

## 2017-01-11 NOTE — Progress Notes (Signed)
Physical Therapy Treatment Patient Details Name: Cindy Oconnor MRN: [id number] DOB: [birth year] Today's Date: 01/11/2017    History of Present Illness 46 y.o.year old females/p L TKA (01/10/17), WBAT. PMHx significant for OA, chronic ulcerative colitis, asthma, RA, GERD, and Down'ssyndrome.     PT Comments    Patient able to complete sit/stand with RW, min assist +2 with max encouragement from both mother and therapist during session.  Poor weight acceptance through L LE, limited tolerance for pain with any mobility/WBing.  Do anticipate good mobility progression once pain controlled/resolved, as strength and overall mobility performance is good for functional activities. BP remains on the lower side (100s/50s), but stable throughout session this PM.  Did note intermittent head-shaking with initial transition to upright; appeared to resolve with accommodation to position (question effect of orthostasis vs fear/anxiety with movement transition).   RN informed/aware. Plan to initiate OOB to chair next session as tolerated.   Follow Up Recommendations  SNF     Equipment Recommendations       Recommendations for Other Services       Precautions / Restrictions Precautions Precautions: Fall Restrictions Weight Bearing Restrictions: Yes LLE Weight Bearing: Weight bearing as tolerated    Mobility  Bed Mobility Overal bed mobility: Needs Assistance Bed Mobility: Supine to Sit;Sit to Supine     Supine to sit: Min assist Sit to supine: Min assist   General bed mobility comments: able to indep negotiate L LE over edge of bed, min cuing/assist to sequence and initiate  Transfers Overall transfer level: Needs assistance Equipment used: Rolling walker (2 wheeled) Transfers: Sit to/from Stand Sit to Stand: Min assist;+2 physical assistance         General transfer comment: broad BOS, excessive weight shift R LE (limited weight acceptance L LE).  tolerating x8 seconds prior  to spontaneous sit  Ambulation/Gait             General Gait Details: unsafe/unable   Stairs            Wheelchair Mobility    Modified Rankin (Stroke Patients Only)       Balance   Sitting-balance support: No upper extremity supported;Feet supported Sitting balance-Leahy Scale: Good     Standing balance support: Bilateral upper extremity supported Standing balance-Leahy Scale: Fair                              Cognition Arousal/Alertness: Awake/alert Behavior During Therapy: WFL for tasks assessed/performed Overall Cognitive Status: History of cognitive impairments - at baseline                                        Exercises Total Joint Exercises Goniometric ROM: 9-60 degrees, L knee Other Exercises Other Exercises: L knee flexion activities: supine heel slides with isometric holds at tolerable end-range x5; LAQs in unsupported sitting x5.  Noted with intermittent head-shaking with initial transition to upright; appeared to resolve with accommodation to position (question effect of orthostasis vs fear/anxiety with movement transition). Other Exercises: Rolling bilat for bedpan use, min assist; dep assist for hygiene.    General Comments        Pertinent Vitals/Pain Pain Assessment: Faces Faces Pain Scale: Hurts even more Pain Location: L knee Pain Descriptors / Indicators: Aching;Grimacing;Guarding Pain Intervention(s): Limited activity within patient's tolerance;Monitored during session;Repositioned    Home Living  Prior Function            PT Goals (current goals can now be found in the care plan section) Acute Rehab PT Goals Patient Stated Goal: to make my knee stop hurting PT Goal Formulation: With patient/family Time For Goal Achievement: 01/25/17 Potential to Achieve Goals: Good Progress towards PT goals: Progressing toward goals    Frequency    BID      PT Plan  Current plan remains appropriate    Co-evaluation              AM-PAC PT "6 Clicks" Daily Activity  Outcome Measure  Difficulty turning over in bed (including adjusting bedclothes, sheets and blankets)?: Total Difficulty moving from lying on back to sitting on the side of the bed? : Total Difficulty sitting down on and standing up from a chair with arms (e.g., wheelchair, bedside commode, etc,.)?: Total Help needed moving to and from a bed to chair (including a wheelchair)?: A Lot Help needed walking in hospital room?: A Lot Help needed climbing 3-5 steps with a railing? : Total 6 Click Score: 8    End of Session Equipment Utilized During Treatment: Gait belt Activity Tolerance: Patient limited by pain Patient left: in bed;with call bell/phone within reach;with family/visitor present (mother refusing bed alarm) Nurse Communication: Mobility status PT Visit Diagnosis: Muscle weakness (generalized) (M62.81);Difficulty in walking, not elsewhere classified (R26.2);Pain Pain - Right/Left: Left Pain - part of body: Knee     Time: 6606-0045 PT Time Calculation (min) (ACUTE ONLY): 36 min  Charges:  $Therapeutic Activity: 23-37 mins                    G Codes:        Miche Loughridge H. Owens Shark, PT, DPT, NCS 01/11/17, 4:00 PM [phone]

## 2017-01-11 NOTE — Clinical Social Work Placement (Addendum)
   CLINICAL SOCIAL WORK PLACEMENT  NOTE  Date:  01/11/2017  Patient Details  Name: Cindy Oconnor MRN: [id number] Date of Birth: 02-21-1971  Clinical Social Work is seeking post-discharge placement for this patient at the [place] level of care (*CSW will initial, date and re-position this form in  chart as items are completed):  Yes   Patient/family provided with [place] Work Department's list of facilities offering this level of care within the geographic area requested by the patient (or if unable, by the patient's family).  Yes   Patient/family informed of their freedom to choose among providers that offer the needed level of care, that participate in Medicare, Medicaid or managed care program needed by the patient, have an available bed and are willing to accept the patient.  Yes   Patient/family informed of [facility]'s ownership interest in [facility] and [facility], as well as of the fact that they are under no obligation to receive care at these facilities.  PASRR submitted to EDS on 01/10/17     PASRR number received on 01/11/17     Existing PASRR number confirmed on       FL2 transmitted to all facilities in geographic area requested by pt/family on 01/10/17     FL2 transmitted to all facilities within larger geographic area on       Patient informed that his/her managed care company has contracts with or will negotiate with certain facilities, including the following:        Yes   Patient/family informed of bed offers received.  Patient chooses bed at  [facility] )     Physician recommends and patient chooses bed at      Patient to be transferred to  [facility] on  01-13-17.  (updated Evette Cristal, MSW, [place], 01-13-17)  Patient to be transferred to facility by  [facility] (updated Evette Cristal, MSW, [place], 01-13-17)     Patient family notified on  01-13-17 of transfer. (updated Evette Cristal, MSW, [place], 01-13-17)  Name of family member notified:   Patient's mother [name] at bedside. (updated Evette Cristal, MSW, [place], 01-13-17)     PHYSICIAN       Additional Comment:    _______________________________________________ Sample, Veronia Beets, LCSW 01/11/2017, 3:01 PM (updated Evette Cristal, MSW, [place], 01-13-17)

## 2017-01-11 NOTE — Evaluation (Signed)
Physical Therapy Evaluation Patient Details Name: Cindy Oconnor MRN: [id number] DOB: 11-30-1970 Today's Date: 01/11/2017   History of Present Illness  46 y.o.year old females/p L TKA (01/10/17), WBAT. PMHx significant for OA, chronic ulcerative colitis, asthma, RA, GERD, and Down'ssyndrome.   Clinical Impression  Upon evaluation, patient alert and oriented to basic information; follows simple commands and demonstrates good effort with therapeutic tasks (with mod encouragement from therapist).  Demonstrates fair/good L quad control with isolated therex; limited tolerance for flexion activities.  Currently requiring min assist for bed mobility; close sup for unsupported sitting balance.  Significant complaints of dizziness with associated decrease in responsiveness noted with transition to upright; unable to maintain for full seated BP measurement, requiring return to supine.  RN informed/aware. Will continue to assess/progress mobility in subsequent sessions as medically appropriate. Would benefit from skilled PT to address above deficits and promote optimal return to PLOF; recommend transition to STR upon discharge from acute hospitalization.     Follow Up Recommendations SNF    Equipment Recommendations       Recommendations for Other Services       Precautions / Restrictions Precautions Precautions: Fall Required Braces or Orthoses: Knee Immobilizer - Left Knee Immobilizer - Left: Discontinue once straight leg raise with < 10 degree lag Restrictions Weight Bearing Restrictions: Yes LLE Weight Bearing: Weight bearing as tolerated      Mobility  Bed Mobility Overal bed mobility: Needs Assistance Bed Mobility: Supine to Sit;Sit to Supine     Supine to sit: Min assist Sit to supine: Min assist   General bed mobility comments: assist for L LE management  Transfers                 General transfer comment: unsafe/unable due to symptomatic  orthostasis  Ambulation/Gait             General Gait Details: unsafe/unable due to symptomatic orthostasis  Stairs            Wheelchair Mobility    Modified Rankin (Stroke Patients Only)       Balance Overall balance assessment: Needs assistance Sitting-balance support: No upper extremity supported;Feet supported Sitting balance-Leahy Scale: Good         Standing balance comment: unsafe/unable due to symptomatic orthostasis                             Pertinent Vitals/Pain Pain Assessment: Faces Faces Pain Scale: Hurts little more Pain Location: L knee Pain Descriptors / Indicators: Aching;Grimacing;Guarding Pain Intervention(s): Limited activity within patient's tolerance;Monitored during session;Repositioned    Home Living Family/patient expects to be discharged to:: Skilled nursing facility                 Additional Comments: Pt lives in group home    Prior Function Level of Independence: Needs assistance   Gait / Transfers Assistance Needed: ambulating with rollator independently  ADL's / Homemaking Assistance Needed: Pt required occasional assist for seated showers; group home provides medication mgt, meals, and cleaning. Pt enjoys watching movies, playing on her computer tablet, and dancing.  Comments: Mod indep for ADLs, household and limited community mobility with 4WRW; denies fall history.     Hand Dominance        Extremity/Trunk Assessment   Upper Extremity Assessment Upper Extremity Assessment: Overall WFL for tasks assessed (global arthritic changes throughout bilat hands)    Lower Extremity Assessment Lower Extremity Assessment: Generalized weakness (R  LE grossly at least 4/5, L LE hip and ankle 4-/5, knee 3-/5 (limited by pain))       Communication   Communication: HOH (decreased hearing R ear, deaf  L ear)  Cognition Arousal/Alertness: Awake/alert Behavior During Therapy: WFL for tasks  assessed/performed Overall Cognitive Status: History of cognitive impairments - at baseline                                 General Comments: alert and oriented x4, follows simple commands      General Comments      Exercises Total Joint Exercises Goniometric ROM: Lacking 9 degrees full extension; unable to measure flexion in unsupported sitting due to symptomatic orthostasis (will complete in PM) Other Exercises Other Exercises: Supine LE therex, 1x10, act assist ROM: ankle pumps, hip abduct/adduct, heel slides, SLR.  good L quad activation; limited tolerance for flexion activities due to pain.   Assessment/Plan    PT Assessment Patient needs continued PT services  PT Problem List Decreased strength;Decreased range of motion;Decreased activity tolerance;Decreased balance;Decreased mobility;Decreased coordination;Decreased knowledge of use of DME;Decreased cognition;Decreased safety awareness;Decreased knowledge of precautions;Obesity;Decreased skin integrity;Pain       PT Treatment Interventions DME instruction;Gait training;Stair training;Functional mobility training;Therapeutic activities;Therapeutic exercise;Balance training;Patient/family education    PT Goals (Current goals can be found in the Care Plan section)  Acute Rehab PT Goals Patient Stated Goal: to make my knee stop hurting PT Goal Formulation: With patient/family Time For Goal Achievement: 01/25/17 Potential to Achieve Goals: Good Additional Goals Additional Goal #1: Assess and establish goals for OOB activities as appropriate.    Frequency BID   Barriers to discharge Decreased caregiver support      Co-evaluation               AM-PAC PT "6 Clicks" Daily Activity  Outcome Measure Difficulty turning over in bed (including adjusting bedclothes, sheets and blankets)?: Total Difficulty moving from lying on back to sitting on the side of the bed? : Total Difficulty sitting down on and  standing up from a chair with arms (e.g., wheelchair, bedside commode, etc,.)?: Total Help needed moving to and from a bed to chair (including a wheelchair)?: A Lot Help needed walking in hospital room?: A Lot Help needed climbing 3-5 steps with a railing? : A Lot 6 Click Score: 9    End of Session   Activity Tolerance: Treatment limited secondary to medical complications (Comment) (symptomatic orthostasis) Patient left: in bed;with call bell/phone within reach;with family/visitor present (mother refusing bed alarm; RN informed/aware.  Mother instructed to inform nursing if leaving room to allow for alarm to be turned on) Nurse Communication: Mobility status PT Visit Diagnosis: Muscle weakness (generalized) (M62.81);Difficulty in walking, not elsewhere classified (R26.2);Pain Pain - Right/Left: Left Pain - part of body: Knee    Time: 9935-7017 PT Time Calculation (min) (ACUTE ONLY): 23 min   Charges:   PT Evaluation $PT Eval Moderate Complexity: 1 Mod PT Treatments $Therapeutic Exercise: 8-22 mins   PT G Codes:   PT G-Codes **NOT FOR INPATIENT CLASS** Functional Assessment Tool Used: AM-PAC 6 Clicks Basic Mobility Functional Limitation: Mobility: Walking and moving around Mobility: Walking and Moving Around Current Status (B9390): At least 60 percent but less than 80 percent impaired, limited or restricted Mobility: Walking and Moving Around Goal Status [phone]): At least 1 percent but less than 20 percent impaired, limited or restricted    Lavin Petteway H. Owens Shark, PT,  DPT, NCS 01/11/17, 10:59 AM [phone]

## 2017-01-12 LAB — CBC
HCT: 31.4 % — ABNORMAL LOW (ref 35.0–47.0)
Hemoglobin: 10.6 g/dL — ABNORMAL LOW (ref 12.0–16.0)
MCH: 34.9 pg — ABNORMAL HIGH (ref 26.0–34.0)
MCHC: 33.8 g/dL (ref 32.0–36.0)
MCV: 103.1 fL — AB (ref 80.0–100.0)
PLATELETS: 306 10*3/uL (ref 150–440)
RBC: 3.04 MIL/uL — ABNORMAL LOW (ref 3.80–5.20)
RDW: 17.1 % — AB (ref 11.5–14.5)
WBC: 9.3 10*3/uL (ref 3.6–11.0)

## 2017-01-12 LAB — BASIC METABOLIC PANEL
ANION GAP: 6 (ref 5–15)
BUN: 12 mg/dL (ref 6–20)
CALCIUM: 8.3 mg/dL — AB (ref 8.9–10.3)
CO2: 30 mmol/L (ref 22–32)
Chloride: 104 mmol/L (ref 101–111)
Creatinine, Ser: 0.91 mg/dL (ref 0.44–1.00)
GFR calc Af Amer: >60 mL/min (ref >60)
GLUCOSE: 94 mg/dL (ref 65–99)
Potassium: 4 mmol/L (ref 3.5–5.1)
Sodium: 140 mmol/L (ref 135–145)

## 2017-01-12 NOTE — Progress Notes (Signed)
Pt ambulated to bathroom x3. Medicated for pain x3 VSS. Dsg dry and intact. Hemovac and polar care in place. Encouraged to use incentive. Staff will continue to follow.

## 2017-01-12 NOTE — Progress Notes (Signed)
   Subjective: 2 Days Post-Op Procedure(s) (LRB): COMPUTER ASSISTED TOTAL KNEE ARTHROPLASTY (Left) Patient reports pain as 3 on 0-10 scale.   Patient is well, and has had no acute complaints or problems Patient did not do real well with physical therapy yesterday however the mother states that she walked to the bathroom 3 times last night.  Plan is to go Rehab after hospital stay. no nausea and no vomiting. Mother states that most of her symptoms from yesterday have resolved except she still has occasional dry heave Patient denies any chest pains or shortness of breath. Patient continues to refuse a bone foam. Also still complaining of pain with even use of the rolled towels. Objective: Vital signs in last 24 hours: Temp:  [97.8 F (36.6 C)-98.5 F (36.9 C)] 98.5 F (36.9 C) (08/14 2313) Pulse Rate:  [55-84] 73 (08/14 2313) Resp:  [18-19] 19 (08/14 2313) BP: (80-107)/(36-83) 101/51 (08/14 2313) SpO2:  [96 %-97 %] 96 % (08/14 2313) well approximated incision Heels are non tender and elevated off the bed using rolled towels Intake/Output from previous day: 08/14 0701 - 08/15 0700 In: 240 [P.O.:240] Out: 250 [Drains:250] Intake/Output this shift: Total I/O In: -  Out: 250 [Drains:250]   Recent Labs  01/11/17 0423 01/12/17 0407  HGB 11.1* 10.6*    Recent Labs  01/11/17 0423 01/12/17 0407  WBC 11.5* 9.3  RBC 3.24* 3.04*  HCT 33.4* 31.4*  PLT 292 306    Recent Labs  01/11/17 0423 01/12/17 0407  NA 140 140  K 4.1 4.0  CL 105 104  CO2 30 30  BUN 8 12  CREATININE 0.69 0.91  GLUCOSE 137* 94  CALCIUM 7.8* 8.3*   No results for input(s): LABPT, INR in the last 72 hours.  EXAM General - Patient is Alert, Appropriate and Oriented Extremity - Neurologically intact Neurovascular intact Sensation intact distally Intact pulses distally Dorsiflexion/Plantar flexion intact No cellulitis present Compartment soft Dressing - scant drainage Motor Function - intact,  moving foot and toes well on exam.   Patient laying with left leg in a frog leg position.  Past Medical History:  Diagnosis Date  . Arthritis   . Asthma   . Blood clot in vein   . Chicken pox   . Deafness in left ear   . Down syndrome   . GERD (gastroesophageal reflux disease)   . Hypothyroidism   . Lupus 1995  . RA (rheumatoid arthritis) ([place])   . Ulcerative (chronic) enterocolitis ([place])   . Ulcerative colitis ([place])     Assessment/Plan: 2 Days Post-Op Procedure(s) (LRB): COMPUTER ASSISTED TOTAL KNEE ARTHROPLASTY (Left) Active Problems:   S/P total knee arthroplasty  Estimated body mass index is 40.4 kg/m as calculated from the following:   Height as of this encounter: 4' 9"  (1.448 m).   Weight as of this encounter: 84.7 kg (186 lb 11.2 oz). Up with therapy Plan for discharge tomorrow Discharge to SNF  Labs: Were reviewed. Hemoglobin 10.6 DVT Prophylaxis - Lovenox, Foot Pumps and TED hose Weight-Bearing as tolerated to left leg Hemovac discontinued on today's visit. Encouraged patient to keep her foot upwards so as to keep the flexion contraction down.  Jillyn Ledger. [place] 01/12/2017, 6:41 AM

## 2017-01-12 NOTE — Progress Notes (Signed)
Physical Therapy Treatment Patient Details Name: Cindy Oconnor MRN: [id number] DOB: Nov 26, 1970 Today's Date: 01/12/2017    History of Present Illness 46 y.o.year old females/p L TKA (01/10/17), WBAT. PMHx significant for OA, chronic ulcerative colitis, asthma, RA, GERD, and Down'ssyndrome.     PT Comments    Pt is pleasant and willing to participate with appropriate motivations. Pt enjoys singing and her teddy bear. Pt mom present throughout session. Pt performs bed mobility with MinA, and tranfers and ambulation with MinA +2, due to impaired strength, balance, endurance, safety awareness, and pain. Pt able to demo adequate strength for SLR this AM, and no longer requires KI. Overall, pt responded well to today's treatment with no adverse affects, and is progressing well toward functional mobility goals. Pt left with nursing at end of session, who agreed to reapply pt cryotherapy and towel roll. Pt would benefit from skilled PT to address the previously mentioned impairments and promote return to PLOF. Currently recommending SNF, pending d/c.     Follow Up Recommendations  SNF     Equipment Recommendations  None recommended by PT    Recommendations for Other Services       Precautions / Restrictions Precautions Precautions: Fall Restrictions Weight Bearing Restrictions: Yes LLE Weight Bearing: Weight bearing as tolerated    Mobility  Bed Mobility Overal bed mobility: Needs Assistance Bed Mobility: Supine to Sit     Supine to sit: Min assist     General bed mobility comments: Pt minA with supine to sit and requiring min cues for mechnacis and safety.   Transfers Overall transfer level: Needs assistance Equipment used: Rolling walker (2 wheeled) Transfers: Sit to/from Stand Sit to Stand: Min assist;+2 safety/equipment         General transfer comment: Pt requires minA +2 for safety, as pt is impulsive, scared, and in a lot of pain. Min verbal and tactlie cues  for mechancis and safety.   Ambulation/Gait Ambulation/Gait assistance: Min assist Ambulation Distance (Feet): 30 Feet Assistive device: Rolling walker (2 wheeled) Gait Pattern/deviations: Step-through pattern     General Gait Details: Pt amb total of 30 ft in 10 ft incraments, due to pain. Requires min cues for mechancis and safety. Poor obstacle navigation and spacial awareness.   Stairs            Wheelchair Mobility    Modified Rankin (Stroke Patients Only)       Balance                                            Cognition Arousal/Alertness: Awake/alert Behavior During Therapy: WFL for tasks assessed/performed Overall Cognitive Status: History of cognitive impairments - at baseline                                 General Comments: alert and oriented x4, follows simple commands      Exercises Total Joint Exercises Goniometric ROM: Pt lacks 5 deg ext ROM. Pt lethargic after amb, and unable to participate in AROM. Will assess this afternoon. Other Exercises Other Exercises: Toileting: pt required minA +2 for all toileting needs and transfers. Requires prop under L ft when on toilet to reduce pain.     General Comments        Pertinent Vitals/Pain Pain Assessment: Faces Faces Pain Scale: Hurts  even more Pain Location: L knee Pain Descriptors / Indicators: Aching;Grimacing;Guarding;Operative site guarding Pain Intervention(s): Limited activity within patient's tolerance;Monitored during session;Premedicated before session    Home Living                      Prior Function            PT Goals (current goals can now be found in the care plan section) Acute Rehab PT Goals Patient Stated Goal: to make my knee stop hurting PT Goal Formulation: With patient/family Time For Goal Achievement: 01/25/17 Potential to Achieve Goals: Good Progress towards PT goals: Progressing toward goals    Frequency    BID       PT Plan Current plan remains appropriate    Co-evaluation              AM-PAC PT "6 Clicks" Daily Activity  Outcome Measure  Difficulty turning over in bed (including adjusting bedclothes, sheets and blankets)?: Total Difficulty moving from lying on back to sitting on the side of the bed? : Total Difficulty sitting down on and standing up from a chair with arms (e.g., wheelchair, bedside commode, etc,.)?: Total Help needed moving to and from a bed to chair (including a wheelchair)?: A Lot Help needed walking in hospital room?: A Lot Help needed climbing 3-5 steps with a railing? : Total 6 Click Score: 8    End of Session Equipment Utilized During Treatment: Gait belt Activity Tolerance: Patient limited by pain Patient left: in chair;with call bell/phone within reach;with chair alarm set;with family/visitor present;with nursing/sitter in room Nurse Communication: Mobility status;Other (comment) (Reaplying ice) PT Visit Diagnosis: Muscle weakness (generalized) (M62.81);Difficulty in walking, not elsewhere classified (R26.2);Pain Pain - Right/Left: Left Pain - part of body: Knee     Time: 9735-3299 PT Time Calculation (min) (ACUTE ONLY): 31 min  Charges:  $Gait Training: 8-22 mins $Therapeutic Activity: 8-22 mins                    G Codes:       Oran Rein PT, SPT   Bevelyn Ngo 01/12/2017, 2:22 PM

## 2017-01-12 NOTE — Progress Notes (Signed)
OT Cancellation Note  Patient Details Name: Cindy Oconnor MRN: [id number] DOB: June 27, 1970   Cancelled Treatment:    Reason Eval/Treat Not Completed: Fatigue/lethargy limiting ability to participate. Pt asleep and mother in room upon attempt to treat. Pt's mom reports pt is very fatigued and had previously had difficulty with pain management this date. Requesting pt to rest this afternoon. Will re-attempt next date.   Jeni Salles, MPH, MS, OTR/L ascom [phone] 01/12/17, 3:19 PM

## 2017-01-12 NOTE — Progress Notes (Addendum)
Plan is for patient to D/C to [facility] (private room) on Thursday 01/13/17 pending medical clearance. [name] admissions coordinator at [facility] is aware of above. Patient's mother/ guardian [name] is aware of above. CSW will continue to follow and assist as needed.  [employer], LCSW  [phone]

## 2017-01-12 NOTE — Progress Notes (Signed)
Physical Therapy Treatment Patient Details Name: Cindy Oconnor MRN: [id number] DOB: Dec 22, 1970 Today's Date: 01/12/2017    History of Present Illness 46 y.o.year old females/p L TKA (01/10/17), WBAT. PMHx significant for OA, chronic ulcerative colitis, asthma, RA, GERD, and Down'ssyndrome.     PT Comments    Pt pleasant, but limited by pain and fatigue. Pt refused bed mobility, transfers, and amb this session; limited to supine therex. Pt primarily limited by pain this session. Provided education on pain management and pupose of therapy. Pt presents with the following deficits: strength, endurance, and pain. Overall, pt responded well to today's treatment with no adverse affects, and is progressing towards functional goals. Pt would benefit from skilled PT to address the previously mentioned impairments and promote return to PLOF. Currently recommending SNF, pending d/c    Follow Up Recommendations  SNF     Equipment Recommendations  None recommended by PT    Recommendations for Other Services       Precautions / Restrictions Precautions Precautions: Fall Restrictions Weight Bearing Restrictions: Yes LLE Weight Bearing: Weight bearing as tolerated    Mobility  Bed Mobility            General bed mobility comments: Not performed this session, due to pt refusal and high pain.  Transfers          General transfer comment: Not performed this session, due to pt refusal and high pain.  Ambulation/Gait   General Gait Details: Not performed this session, due to pt refusal and high pain.   Stairs            Wheelchair Mobility    Modified Rankin (Stroke Patients Only)       Balance                                            Cognition Arousal/Alertness: Awake/alert Behavior During Therapy: WFL for tasks assessed/performed Overall Cognitive Status: History of cognitive impairments - at baseline                                 General Comments: alert and oriented x4, follows simple commands      Exercises Total Joint Exercises Goniometric ROM: L knee flex 54 deg, limited by pain and pt refusal  Other Exercises Other Exercises: Supine therex performed to L LE with supervision x10 reps: ankle pumps, glute sets, SLR, and hip abd. Required increased time due to increased pain levels.     General Comments        Pertinent Vitals/Pain Pain Assessment: Faces Faces Pain Scale: Hurts even more Pain Location: L knee Pain Descriptors / Indicators: Aching;Grimacing;Guarding;Operative site guarding Pain Intervention(s): Limited activity within patient's tolerance;Monitored during session;Premedicated before session;Patient requesting pain meds-RN notified;Ice applied    Home Living                      Prior Function            PT Goals (current goals can now be found in the care plan section) Acute Rehab PT Goals Patient Stated Goal: to make my knee stop hurting PT Goal Formulation: With patient/family Time For Goal Achievement: 01/25/17 Potential to Achieve Goals: Good Progress towards PT goals: Progressing toward goals    Frequency    BID  PT Plan Current plan remains appropriate    Co-evaluation              AM-PAC PT "6 Clicks" Daily Activity  Outcome Measure  Difficulty turning over in bed (including adjusting bedclothes, sheets and blankets)?: Total Difficulty moving from lying on back to sitting on the side of the bed? : Total Difficulty sitting down on and standing up from a chair with arms (e.g., wheelchair, bedside commode, etc,.)?: Total Help needed moving to and from a bed to chair (including a wheelchair)?: A Lot Help needed walking in hospital room?: A Lot Help needed climbing 3-5 steps with a railing? : Total 6 Click Score: 8    End of Session Equipment Utilized During Treatment: Gait belt Activity Tolerance: No increased pain;Patient limited by  fatigue Patient left: with bed alarm set;in bed;with call bell/phone within reach;with family/visitor present;with SCD's reapplied Nurse Communication: Mobility status;Patient requests pain meds PT Visit Diagnosis: Muscle weakness (generalized) (M62.81);Difficulty in walking, not elsewhere classified (R26.2);Pain Pain - Right/Left: Left Pain - part of body: Knee     Time: 1351-1415 PT Time Calculation (min) (ACUTE ONLY): 24 min  Charges:  $Gait Training: 8-22 mins $Therapeutic Activity: 8-22 mins                    G Codes:       Cindy Oconnor PT, SPT   Bevelyn Ngo 01/12/2017, 3:17 PM

## 2017-01-13 ENCOUNTER — Encounter
Admission: RE | Admit: 2017-01-13 | Discharge: 2017-01-13 | Disposition: A | Discharge status: Home / Self Care | Payer: Medicare Other | Source: Ambulatory Visit | Attending: Internal Medicine | Admitting: Internal Medicine

## 2017-01-13 ENCOUNTER — Encounter: Admission: RE | Admit: 2017-01-13 | Payer: Medicare Other | Source: Ambulatory Visit | Admitting: Internal Medicine

## 2017-01-13 DIAGNOSIS — M6281 Muscle weakness (generalized): Secondary | ICD-10-CM | POA: Diagnosis not present

## 2017-01-13 DIAGNOSIS — M810 Age-related osteoporosis without current pathological fracture: Secondary | ICD-10-CM | POA: Diagnosis not present

## 2017-01-13 DIAGNOSIS — Z7951 Long term (current) use of inhaled steroids: Secondary | ICD-10-CM | POA: Diagnosis not present

## 2017-01-13 DIAGNOSIS — Z471 Aftercare following joint replacement surgery: Secondary | ICD-10-CM | POA: Diagnosis not present

## 2017-01-13 DIAGNOSIS — E039 Hypothyroidism, unspecified: Secondary | ICD-10-CM | POA: Diagnosis not present

## 2017-01-13 DIAGNOSIS — Z96652 Presence of left artificial knee joint: Secondary | ICD-10-CM | POA: Diagnosis not present

## 2017-01-13 DIAGNOSIS — Z7401 Bed confinement status: Secondary | ICD-10-CM | POA: Diagnosis not present

## 2017-01-13 DIAGNOSIS — Q909 Down syndrome, unspecified: Secondary | ICD-10-CM | POA: Diagnosis not present

## 2017-01-13 DIAGNOSIS — Z7952 Long term (current) use of systemic steroids: Secondary | ICD-10-CM | POA: Diagnosis not present

## 2017-01-13 DIAGNOSIS — M1990 Unspecified osteoarthritis, unspecified site: Secondary | ICD-10-CM | POA: Diagnosis not present

## 2017-01-13 DIAGNOSIS — K219 Gastro-esophageal reflux disease without esophagitis: Secondary | ICD-10-CM | POA: Diagnosis not present

## 2017-01-13 DIAGNOSIS — J4540 Moderate persistent asthma, uncomplicated: Secondary | ICD-10-CM | POA: Diagnosis not present

## 2017-01-13 DIAGNOSIS — R6889 Other general symptoms and signs: Secondary | ICD-10-CM | POA: Diagnosis not present

## 2017-01-13 DIAGNOSIS — I071 Rheumatic tricuspid insufficiency: Secondary | ICD-10-CM | POA: Diagnosis not present

## 2017-01-13 DIAGNOSIS — M069 Rheumatoid arthritis, unspecified: Secondary | ICD-10-CM | POA: Diagnosis not present

## 2017-01-13 DIAGNOSIS — M1712 Unilateral primary osteoarthritis, left knee: Secondary | ICD-10-CM | POA: Diagnosis not present

## 2017-01-13 DIAGNOSIS — K51919 Ulcerative colitis, unspecified with unspecified complications: Secondary | ICD-10-CM | POA: Diagnosis not present

## 2017-01-13 DIAGNOSIS — R2689 Other abnormalities of gait and mobility: Secondary | ICD-10-CM | POA: Diagnosis not present

## 2017-01-13 MED ORDER — TRAMADOL HCL 50 MG PO TABS: 50.0000 mg | ORAL_TABLET | ORAL | 0 refills | Status: DC | PRN

## 2017-01-13 MED ORDER — ENOXAPARIN SODIUM 30 MG/0.3ML ~~LOC~~ SOLN: 30.0000 mg | Freq: Two times a day (BID) | SUBCUTANEOUS | 0 refills | Status: DC

## 2017-01-13 NOTE — Progress Notes (Signed)
   Subjective: 3 Days Post-Op Procedure(s) (LRB): COMPUTER ASSISTED TOTAL KNEE ARTHROPLASTY (Left) Patient reports pain as 3 on 0-10 scale.   Patient is well, and has had no acute complaints or problems Patient did not do a lot yesterday with physical therapy. Mother states that this is secondary to the fact that the oxycodone had made her extremely drowsy and sleepy. Plan is to go Rehab after hospital stay. no nausea and no vomiting Patient denies any chest pains or shortness of breath. TED stockings were not applied to the operative leg yesterday  Objective: Vital signs in last 24 hours: Temp:  [97.6 F (36.4 C)-98.5 F (36.9 C)] 98.5 F (36.9 C) (08/15 2320) Pulse Rate:  [86-90] 90 (08/16 0626) Resp:  [19-20] 19 (08/15 2320) BP: (106-113)/(54-58) 106/54 (08/16 0626) SpO2:  [92 %-96 %] 96 % (08/16 0626) well approximated incision Heels are non tender and elevated off the bed using rolled towels Intake/Output from previous day: 08/15 0701 - 08/16 0700 In: 600 [P.O.:600] Out: 850 [Urine:850] Intake/Output this shift: No intake/output data recorded.   Recent Labs  01/11/17 0423 01/12/17 0407  HGB 11.1* 10.6*    Recent Labs  01/11/17 0423 01/12/17 0407  WBC 11.5* 9.3  RBC 3.24* 3.04*  HCT 33.4* 31.4*  PLT 292 306    Recent Labs  01/11/17 0423 01/12/17 0407  NA 140 140  K 4.1 4.0  CL 105 104  CO2 30 30  BUN 8 12  CREATININE 0.69 0.91  GLUCOSE 137* 94  CALCIUM 7.8* 8.3*   No results for input(s): LABPT, INR in the last 72 hours.  EXAM General - Patient is Alert, Appropriate and Oriented Extremity - Neurologically intact Neurovascular intact Sensation intact distally Intact pulses distally Dorsiflexion/Plantar flexion intact No cellulitis present Compartment soft Dressing - scant drainage Motor Function - intact, moving foot and toes well on exam.    Past Medical History:  Diagnosis Date  . Arthritis   . Asthma   . Blood clot in vein   .  Chicken pox   . Deafness in left ear   . Down syndrome   . GERD (gastroesophageal reflux disease)   . Hypothyroidism   . Lupus 1995  . RA (rheumatoid arthritis) ([place])   . Ulcerative (chronic) enterocolitis ([place])   . Ulcerative colitis ([place])     Assessment/Plan: 3 Days Post-Op Procedure(s) (LRB): COMPUTER ASSISTED TOTAL KNEE ARTHROPLASTY (Left) Active Problems:   S/P total knee arthroplasty  Estimated body mass index is 40.4 kg/m as calculated from the following:   Height as of this encounter: 4' 9"  (1.448 m).   Weight as of this encounter: 84.7 kg (186 lb 11.2 oz). Up with therapy Discharge to SNF  Labs: None DVT Prophylaxis - Lovenox, Foot Pumps and TED hose Weight-Bearing as tolerated to left leg Please change dressing to operative leg and apply TED stockings prior to patient being discharged. Bone foam is to go with the patient.  Jillyn Ledger. [place] 01/13/2017, 7:03 AM

## 2017-01-13 NOTE — Progress Notes (Signed)
RN called report to Product/process development scientist at [facility]. All questions answered. AVS and prescriptions in packet. EMS called for transport.   Deri Fuelling, RN

## 2017-01-13 NOTE — Progress Notes (Signed)
Physical Therapy Treatment Patient Details Name: Cindy Oconnor MRN: [id number] DOB: February 08, 1971 Today's Date: 01/13/2017    History of Present Illness 46 y.o.year old females/p L TKA (01/10/17), WBAT. PMHx significant for OA, chronic ulcerative colitis, asthma, RA, GERD, and Down'ssyndrome.     PT Comments    Participated in exercises as described below.  Pt needed tactile cues for exercised but was able to do without physical assist in supine.  Pt tolerated increased knee flexion today on LLE but remains limited by pain/fear of pain.  She does not weight bear when standing on LLE but once up she is able to put her foot down but she remains flexed and walks up on her toes.  She needs verbal and physical cues to not get too close into walker. Posture remains generally flexed during gait.   Mom is helpful to encourage patient to ambulate further but is generally limited by pain/fear of pain.  She is overall proud of herself today during session.   Follow Up Recommendations  SNF     Equipment Recommendations  None recommended by PT    Recommendations for Other Services       Precautions / Restrictions Precautions Precautions: Fall Precaution Comments: KI not used as she could complete SLR without assist or lag Restrictions Weight Bearing Restrictions: Yes LLE Weight Bearing: Weight bearing as tolerated    Mobility  Bed Mobility Overal bed mobility: Needs Assistance Bed Mobility: Supine to Sit     Supine to sit: Min assist        Transfers Overall transfer level: Needs assistance Equipment used: Rolling walker (2 wheeled) Transfers: Sit to/from Stand Sit to Stand: Min assist         General transfer comment: poor hand placmeents despite verbal and tactile cues to correct  Ambulation/Gait Ambulation/Gait assistance: Min assist Ambulation Distance (Feet): 15 Feet Assistive device: Rolling walker (2 wheeled) Gait Pattern/deviations: Step-to pattern   Gait  velocity interpretation: <1.8 ft/sec, indicative of risk for recurrent falls General Gait Details: 6', 75' with moderate verbal cues to correct walker placement and increase distances.   Stairs            Wheelchair Mobility    Modified Rankin (Stroke Patients Only)       Balance Overall balance assessment: Needs assistance Sitting-balance support: No upper extremity supported;Feet supported Sitting balance-Leahy Scale: Good     Standing balance support: Bilateral upper extremity supported Standing balance-Leahy Scale: Fair Standing balance comment: no c/o dizziness/orthostatic symptoms.                            Cognition Arousal/Alertness: Awake/alert Behavior During Therapy: WFL for tasks assessed/performed Overall Cognitive Status: History of cognitive impairments - at baseline                                        Exercises Total Joint Exercises Ankle Circles/Pumps: AROM;10 reps;Both;Supine Short Arc Quad: AROM;Supine;Both;10 reps Heel Slides: AROM;Supine;Both;10 reps Hip ABduction/ADduction: AROM;Supine;Both;10 reps Straight Leg Raises: AROM;Supine;Both;10 reps Long Arc Quad: Seated;Left;10 reps;AAROM Knee Flexion: AAROM;Seated;Left;5 reps Goniometric ROM:  0-67     General Comments        Pertinent Vitals/Pain Pain Assessment: Faces Faces Pain Scale: Hurts even more Pain Location: L knee - with flexion Pain Descriptors / Indicators: Aching;Grimacing;Guarding;Operative site guarding Pain Intervention(s): Limited activity within patient's tolerance;Ice applied;Premedicated  before session;Monitored during session    Home Living                      Prior Function            PT Goals (current goals can now be found in the care plan section) Progress towards PT goals: Progressing toward goals    Frequency    BID      PT Plan Current plan remains appropriate    Co-evaluation               AM-PAC PT "6 Clicks" Daily Activity  Outcome Measure  Difficulty turning over in bed (including adjusting bedclothes, sheets and blankets)?: Total Difficulty moving from lying on back to sitting on the side of the bed? : Total Difficulty sitting down on and standing up from a chair with arms (e.g., wheelchair, bedside commode, etc,.)?: Total Help needed moving to and from a bed to chair (including a wheelchair)?: A Lot Help needed walking in hospital room?: A Lot Help needed climbing 3-5 steps with a railing? : Total 6 Click Score: 8    End of Session Equipment Utilized During Treatment: Gait belt Activity Tolerance: Patient tolerated treatment well;Patient limited by pain Patient left: in chair;with chair alarm set;with call bell/phone within reach;with family/visitor present   Pain - Right/Left: Left Pain - part of body: Knee     Time: 9532-0233 PT Time Calculation (min) (ACUTE ONLY): 31 min  Charges:  $Gait Training: 8-22 mins $Therapeutic Exercise: 8-22 mins                    G Codes:       Chesley Noon, PTA 01/13/17, 10:25 AM

## 2017-01-13 NOTE — Care Management Important Message (Signed)
Important Message  Patient Details  Name: Cindy Oconnor MRN: [id number] Date of Birth: Jul 27, 1970   Medicare Important Message Given:  Yes    Jolly Mango, RN 01/13/2017, 8:18 AM

## 2017-01-13 NOTE — Progress Notes (Signed)
OT Cancellation Note  Patient Details Name: Cindy Oconnor MRN: [id number] DOB: [birth year]   Cancelled Treatment:    Reason Eval/Treat Not Completed: Other (comment). Upon attempt to treat, pt, mother, and CNAs actively preparing for discharge to SNF today. No additional questions/concerns/OT needs at this time. Will hold OT treatment this date, as pt is discharging to [facility] imminently.   Jeni Salles, MPH, MS, OTR/L ascom [phone] 01/13/17, 11:23 AM

## 2017-01-13 NOTE — Clinical Social Work Note (Signed)
Patient to be d/c'ed today to [facility].  Patient and family agreeable to plans will transport via ems RN to call report to room 211 [phone].  Evette Cristal, MSW, [place]

## 2017-01-24 ENCOUNTER — Other Ambulatory Visit: Payer: Self-pay | Admitting: Internal Medicine

## 2017-01-26 ENCOUNTER — Encounter: Payer: Self-pay | Admitting: Gerontology

## 2017-01-26 ENCOUNTER — Non-Acute Institutional Stay (SKILLED_NURSING_FACILITY): Payer: Medicare Other | Admitting: Gerontology

## 2017-01-26 DIAGNOSIS — Z96652 Presence of left artificial knee joint: Secondary | ICD-10-CM | POA: Diagnosis not present

## 2017-01-26 DIAGNOSIS — M1712 Unilateral primary osteoarthritis, left knee: Secondary | ICD-10-CM

## 2017-01-26 NOTE — Progress Notes (Signed)
Location:   [facility] Room Number: 211A Place of Service:  SNF [phone]) Provider:  Toni Arthurs, NP-C  Crecencio Mc, MD  Patient Care Team: Crecencio Mc, MD as PCP - General (Internal Medicine) Jackolyn Confer, MD (Internal Medicine)  Extended Emergency Contact Information Primary Emergency Contact: Morris,Kay Address: [address]          [place], [place] Phone: [phone] Mobile Phone: [phone] Relation: Mother Secondary Emergency Contact: Karren Cobble [place] Phone: [phone] Relation: Father  Code Status:  FULL Goals of care: Advanced Directive information Advanced Directives 01/26/2017  Does Patient Have a Medical Advance Directive? No  Would patient like information on creating a medical advance directive? -     Chief Complaint  Patient presents with  . Hospitalization Follow-up    Follow up on knee surgery    HPI:  Pt is a 46 y.o. female seen today for a hospital f/u s/p admission from [facility] for a Left Total Knee Arthroplasty. Pt has been participating in PT and OT. She is progressing well. Pt reports her pain is well controlled on current regimen. She reports her appetite is good and is having regular BMs. Incision well approximated, dressing CDI. No redness, warmth or drainage. Calves soft, supple. Negative Homan's sign. Pt denies n/v/d/f/c/cp/sob/ha/abd pain/dizziness. VSS. No other complaints.   Past Medical History:  Diagnosis Date  . Arthritis   . Asthma    unspecified  . Blood clot in vein   . Chicken pox   . Colitis   . COPD (chronic obstructive pulmonary disease) ([place])   . Deafness in left ear   . Disease of thyroid gland   . Down syndrome   . DVT (deep venous thrombosis) ([place])   . Fractures   . GERD (gastroesophageal reflux disease)   . Hypothyroidism    unspecified  . 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 Positive anti-CCP antibodies,  negative rheumatoid factor, positive FANA. b. Methotrexate, Prednisone, Plaquenil.   . Joint pain   . Lupus 1995  . Osteoarthritis 01/30/2014   a. Lumbar disc disease. b. Trigger nodules  . Osteoporosis 01/30/2014   a. [place]  . RA (rheumatoid arthritis) ([place])   . Shingles   . Ulcerative (chronic) enterocolitis ([place])   . Ulcerative colitis [facility])    Past Surgical History:  Procedure Laterality Date  . ABDOMINAL HYSTERECTOMY W/ PARTIAL [place]  . BUNIONECTOMY Bilateral    Bunionectomy great toe  . COLONOSCOPY  04/16/2013   Hx of ulcerative colitis - repeat 2 years per Dr. Rayann Heman  . COLONOSCOPY  09/24/2003   Dr. Brita Romp  . FOOT SURGERY Bilateral    x2  . HALLUX VALGUS CORRECTION    . KNEE ARTHROPLASTY Left 01/10/2017   Procedure: COMPUTER ASSISTED TOTAL KNEE ARTHROPLASTY;  Surgeon: Dereck Leep, MD;  Location: [facility] ORS;  Service: Orthopedics;  Laterality: Left;    Allergies  Allergen Reactions  . Erythromycin Other (See Comments), Rash and Shortness Of Breath    Reaction:  Unknown  Reaction:  Unknown   . Albuterol Other (See Comments) and Rash    Reaction:Makes pt jittery  "Jittery" Reaction:  Makes pt jittery     Allergies as of 01/26/2017      Reactions   Erythromycin Other (See Comments), Rash, Shortness Of Breath   Reaction:  Unknown  Reaction:  Unknown    Albuterol Other (See Comments), Rash   Reaction:Makes  pt jittery  "Jittery" Reaction:  Makes pt jittery       Medication List       Accurate as of 01/26/17  3:08 PM. Always use your most recent med list.          acetaminophen 325 MG tablet Commonly known as:  TYLENOL Take 650 mg by mouth 4 (four) times daily.   alendronate 70 MG tablet Commonly known as:  FOSAMAX Take 70 mg by mouth once a week. Take with a full glass of water on an empty stomach. On Wednesday   benzonatate 100 MG capsule Commonly known as:  TESSALON Take 100 mg by mouth 3 (three) times daily as needed for  cough.   bisacodyl 5 MG EC tablet Commonly known as:  DULCOLAX Take 1 tablet (5 mg total) by mouth at bedtime.   budesonide-formoterol 160-4.5 MCG/ACT inhaler Commonly known as:  SYMBICORT Inhale 2 puffs into the lungs 2 (two) times daily.   calcium-vitamin D 500-200 MG-UNIT tablet Commonly known as:  OSCAL WITH D Take 1 tablet by mouth daily.   carbamide peroxide 6.5 % OTIC solution Commonly known as:  DEBROX Place 5 drops into both ears daily as needed.   cholecalciferol 1000 units tablet Commonly known as:  VITAMIN D Take 1 tablet (1,000 Units total) by mouth daily.   docusate sodium 100 MG capsule Commonly known as:  COLACE Take 100 mg by mouth every other day.   enoxaparin 30 MG/0.3ML injection Commonly known as:  LOVENOX Inject 0.3 mLs (30 mg total) into the skin every 12 (twelve) hours.   folic acid 1 MG tablet Commonly known as:  FOLVITE TAKE ONE TABLET BY MOUTH ONCE A DAY. (FOLIC ACID SUPPLEMENT)   gentamicin ointment 0.1 % Commonly known as:  GARAMYCIN Apply 1 application topically 3 (three) times daily as needed (for rash/boils).   guaifenesin 400 MG Tabs tablet Commonly known as:  HUMIBID E Take 400 mg by mouth every 4 (four) hours as needed (for congestion).   guaiFENesin 100 MG/5ML liquid Commonly known as:  ROBITUSSIN Take 100 mg by mouth every 4 (four) hours as needed (for cough or to loosen phlegm).   ketoconazole 2 % cream Commonly known as:  NIZORAL Apply 1 application topically daily. Apply to feet or affected area   ketotifen 0.025 % ophthalmic solution Commonly known as:  ZADITOR Place 1 drop into both eyes 2 (two) times daily as needed (for allergiesgu).   lactulose 10 GM/15ML solution Commonly known as:  CHRONULAC Take 30 g by mouth daily as needed for mild constipation.   levalbuterol 0.63 MG/3ML nebulizer solution Commonly known as:  XOPENEX Take 3 mLs (0.63 mg total) by nebulization 3 (three) times daily as needed for wheezing or  shortness of breath.   levothyroxine 125 MCG tablet Commonly known as:  SYNTHROID, LEVOTHROID TAKE ONE TABLET BY MOUTH BEFORE BREAKFAST   methotrexate 2.5 MG tablet Commonly known as:  RHEUMATREX TAKE 7 TABLETS (17.5MG) BY MOUTH ONCE A WEEK (CAUTION:CHEMOTHERAPY, PROTECT FROM LIGHT)   montelukast 10 MG tablet Commonly known as:  SINGULAIR Take 1 tablet (10 mg total) by mouth at bedtime.   omeprazole 20 MG capsule Commonly known as:  PRILOSEC Take 1 capsule (20 mg total) by mouth 2 (two) times daily.   ondansetron 4 MG tablet Commonly known as:  ZOFRAN Take 4 mg by mouth every 8 (eight) hours as needed for nausea or vomiting.   ONE-A-DAY VITACRAVES ADULT Chew Chew 2 tablets by mouth daily.  predniSONE 2.5 MG tablet Commonly known as:  DELTASONE Take 2.5 mg by mouth daily with breakfast.   senna 8.6 MG tablet Commonly known as:  SENEXON Take 1 tablet (8.6 mg total) by mouth at bedtime.   traMADol 50 MG tablet Commonly known as:  ULTRAM Take 50 mg by mouth every 4 (four) hours as needed for moderate pain or severe pain. Ok to give in addition to scheduled doses for severe pain       Review of Systems  Constitutional: Negative for activity change, appetite change, chills, diaphoresis and fever.  HENT: Negative for congestion, sneezing, sore throat, trouble swallowing and voice change.   Respiratory: Negative for apnea, cough, choking, chest tightness, shortness of breath and wheezing.   Cardiovascular: Negative for chest pain, palpitations and leg swelling.  Gastrointestinal: Negative for abdominal distention, abdominal pain, constipation, diarrhea and nausea.  Genitourinary: Negative for difficulty urinating, dysuria, frequency and urgency.  Musculoskeletal: Positive for arthralgias (typical arthritis). Negative for back pain, gait problem and myalgias.  Skin: Positive for wound. Negative for color change, pallor and rash.  Neurological: Negative for dizziness, tremors,  syncope, speech difficulty, weakness, numbness and headaches.  Psychiatric/Behavioral: Negative for agitation and behavioral problems.  All other systems reviewed and are negative.   Immunization History  Administered Date(s) Administered  . Influenza Whole 03/26/2006, 05/30/2007, 05/31/2009, 01/29/2010  . Influenza,inj,Quad PF,6+ Mos 04/11/2014, 02/27/2015, 03/25/2016  . Influenza-Unspecified 02/29/2012, 04/11/2014  . Pneumococcal Polysaccharide-23 03/26/2006, 06/07/2016  . Td 08/05/2011   Pertinent  Health Maintenance Due  Topic Date Due  . INFLUENZA VACCINE  12/29/2016  . PAP SMEAR  05/16/2017  . MAMMOGRAM  08/19/2017   Fall Risk  12/28/2016 05/19/2015 04/20/2012  Falls in the past year? No No No   Functional Status Survey:    Vitals:   01/26/17 1455  BP: (!) 110/52  Pulse: 85  Resp: 20  Temp: 98.4 F (36.9 C)  SpO2: 100%  Weight: 171 lb 6.4 oz (77.7 kg)  Height: 4' 9"  (1.448 m)   Body mass index is 37.09 kg/m. Physical Exam  Constitutional: She is oriented to person, place, and time. Vital signs are normal. She appears well-developed and well-nourished. She is active and cooperative. She does not appear ill. No distress.  HENT:  Head: Normocephalic and atraumatic.  Mouth/Throat: Uvula is midline, oropharynx is clear and moist and mucous membranes are normal. Mucous membranes are not pale, not dry and not cyanotic.  Eyes: Pupils are equal, round, and reactive to light. Conjunctivae, EOM and lids are normal.  Neck: Trachea normal, normal range of motion and full passive range of motion without pain. Neck supple. No JVD present. No tracheal deviation, no edema and no erythema present. No thyromegaly present.  Cardiovascular: Normal rate, regular rhythm, normal heart sounds, intact distal pulses and normal pulses.  Exam reveals no gallop, no distant heart sounds and no friction rub.   No murmur heard. Pulses:      Dorsalis pedis pulses are 2+ on the right side, and 2+  on the left side.  Pulmonary/Chest: Effort normal and breath sounds normal. No accessory muscle usage. No respiratory distress. She has no decreased breath sounds. She has no wheezes. She has no rhonchi. She has no rales. She exhibits no tenderness.  Abdominal: Normal appearance and bowel sounds are normal. She exhibits no distension and no ascites. There is no tenderness.  Musculoskeletal: She exhibits no edema.       Left knee: She exhibits decreased range of motion  and laceration. Tenderness found.  Expected osteoarthritis, stiffness; Calves soft, supple. Negative Homan's sign  Neurological: She is alert and oriented to person, place, and time. She has normal strength.  Downes Syndrome- High functioning  Skin: Skin is warm and dry. Laceration (left knee) noted. She is not diaphoretic. No cyanosis. No pallor. Nails show no clubbing.  Psychiatric: She has a normal mood and affect. Her speech is normal and behavior is normal. Judgment and thought content normal. Cognition and memory are normal.  Nursing note and vitals reviewed.   Labs reviewed:  Recent Labs  06/07/16 1630  01/05/17 1226 01/11/17 0423 01/12/17 0407  NA 145  < > 138 140 140  K 4.8  < > 4.0 4.1 4.0  CL 105  < > 99 105 104  CO2 32  < > 34* 30 30  GLUCOSE 112*  < > 99 137* 94  BUN 14  < > 13 8 12   CREATININE 0.86  < > 1.01 0.69 0.91  CALCIUM 9.3  < > 9.6 7.8* 8.3*  MG 2.4  --   --   --   --   < > = values in this interval not displayed.  Recent Labs  06/07/16 1630 12/29/16 0948 01/05/17 1226  AST 31 34 27  ALT 27 21 28   ALKPHOS 73 79 87  BILITOT 0.3 0.5 0.4  PROT 6.7 7.1 7.4  ALBUMIN 3.9 3.7 4.3    Recent Labs  06/07/16 1630  12/31/16 1755 01/05/17 1226 01/11/17 0423 01/12/17 0407  WBC 5.6  < > 8.1 9.2 11.5* 9.3  NEUTROABS 4.2  --  7.7* 7.5  --   --   HGB 13.4  < > 12.8 14.0 11.1* 10.6*  HCT 39.5  < > 37.0 43.2 33.4* 31.4*  MCV 102.7*  < > 105.6* 107.8* 103.1* 103.1*  PLT 386.0  < > 296 410.0* 292  306  < > = values in this interval not displayed. Lab Results  Component Value Date   TSH 4.40 06/07/2016   Lab Results  Component Value Date   HGBA1C 5.7 06/27/2013   Lab Results  Component Value Date   CHOL 174 05/19/2015   HDL 47.50 05/19/2015   LDLCALC 107 (H) 05/19/2015   TRIG 97.0 05/19/2015   CHOLHDL 4 05/19/2015    Significant Diagnostic Results in last 30 days:  No results found.  Assessment/Plan 1. Primary osteoarthritis of left knee 2. Status post total left knee replacement  Continue PT/OT  Continue exercises as taught by PT/OT  Skin care per protocol   TEDs for edema control/ DVT prophylaxis  Continue Lovenox 30 mg SQ Q 12 hours for DVT prophylaxis  Continue APAP 650 mg po QID scheduled for pain  Continue Tramadol 50 mg po Q 4 hours prn pain  Follow up with Orthopedist as instructed  Family/ staff Communication:   Total Time:  Documentation:  Face to Face:  Family/Phone:   Labs/tests ordered:    Vikki Ports, NP-C [facility] [address], [place] [address] Cell Phone (Mon-Fri 8am-5pm):  [phone] On Call:  [phone] & follow prompts after 5pm & weekends Office Phone:  [phone] Office Fax:  [phone]

## 2017-01-29 ENCOUNTER — Encounter
Admission: RE | Admit: 2017-01-29 | Discharge: 2017-01-29 | Disposition: A | Discharge status: Home / Self Care | Payer: Medicare Other | Source: Ambulatory Visit | Attending: Internal Medicine | Admitting: Internal Medicine

## 2017-02-02 ENCOUNTER — Encounter: Payer: Self-pay | Admitting: Gerontology

## 2017-02-02 ENCOUNTER — Non-Acute Institutional Stay (SKILLED_NURSING_FACILITY): Payer: Medicare Other | Admitting: Gerontology

## 2017-02-02 DIAGNOSIS — M1712 Unilateral primary osteoarthritis, left knee: Secondary | ICD-10-CM

## 2017-02-02 DIAGNOSIS — Z96652 Presence of left artificial knee joint: Secondary | ICD-10-CM | POA: Diagnosis not present

## 2017-02-02 NOTE — Progress Notes (Signed)
Location:   [facility] Room Number: 211A Place of Service:  SNF [phone])  Provider: Toni Arthurs, NP-C  PCP: Crecencio Mc, MD Patient Care Team: Crecencio Mc, MD as PCP - General (Internal Medicine) Jackolyn Confer, MD (Internal Medicine)  Extended Emergency Contact Information Primary Emergency Contact: Morris,Kay Address: [address]          [place], [place] Phone: [phone] Mobile Phone: [phone] Relation: Mother Secondary Emergency Contact: Karren Cobble [place] Phone: [phone] Relation: Father  Code Status: FULL Goals of care:  Advanced Directive information Advanced Directives 02/02/2017  Does Patient Have a Medical Advance Directive? No  Would patient like information on creating a medical advance directive? -     Allergies  Allergen Reactions  . Erythromycin Other (See Comments), Rash and Shortness Of Breath    Reaction:  Unknown  Reaction:  Unknown   . Albuterol Other (See Comments) and Rash    Reaction:Makes pt jittery  "Jittery" Reaction:  Makes pt jittery     Chief Complaint  Patient presents with  . Discharge Note    Discharged from SNF    HPI:  46 y.o. female seen today for discharge evaluation r/t s/p admission from [facility] for a Left Total Knee Arthroplasty. Pt has been participating in PT and OT. She is progressing well. Pt reports her pain is well controlled on current regimen. She reports her appetite is good and is having regular BMs. Incision well approximated, steri strips in place OTA. MIld redness, Mild warmth, no drainage. Calves soft, supple. Negative Homan's sign. Pt denies n/v/d/f/c/cp/sob/ha/abd pain/dizziness. Pt is scheduled to DC home to [facility] group [facility] today, being transported by her mother. Pt is excited to be going home. VSS. No other complaints.    Past Medical History:  Diagnosis Date  . Arthritis   . Asthma    unspecified  . Blood clot in vein   . Chicken pox   . Colitis   . COPD (chronic obstructive pulmonary disease) ([place])   . Deafness in left ear   . Disease of thyroid gland   . Down syndrome   . DVT (deep venous thrombosis) ([place])   . Fractures   . GERD (gastroesophageal reflux disease)   . Hypothyroidism    unspecified  . 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 Positive anti-CCP antibodies, negative rheumatoid factor, positive FANA. b. Methotrexate, Prednisone, Plaquenil.   . Joint pain   . Lupus 1995  . Osteoarthritis 01/30/2014   a. Lumbar disc disease. b. Trigger nodules  . Osteoporosis 01/30/2014   a. Boniva  . RA (rheumatoid arthritis) ([place])   . Shingles   . Ulcerative (chronic) enterocolitis ([place])   . Ulcerative colitis [facility])     Past Surgical History:  Procedure Laterality Date  . ABDOMINAL HYSTERECTOMY W/ PARTIAL [place]  . BUNIONECTOMY Bilateral    Bunionectomy great toe  . COLONOSCOPY  04/16/2013   Hx of ulcerative colitis - repeat 2 years per Dr. Rayann Heman  . COLONOSCOPY  09/24/2003   Dr. Brita Romp  . FOOT SURGERY Bilateral    x2  . HALLUX VALGUS CORRECTION    . KNEE ARTHROPLASTY Left 01/10/2017   Procedure: COMPUTER ASSISTED TOTAL KNEE ARTHROPLASTY;  Surgeon: Dereck Leep, MD;  Location: [facility] ORS;  Service: Orthopedics;  Laterality: Left;      reports that she has never smoked. She has never used smokeless tobacco. She reports  that she does not drink alcohol or use drugs. Social History   Social History  . Marital status: Single    Spouse name: N/A  . Number of children: 0  . Years of education: N/A   Occupational History  . Not on file.   Social History Main Topics  . Smoking status: Never Smoker  . Smokeless tobacco: Never Used  . Alcohol use No  . Drug use: No  . Sexual activity: Not on file   Other Topics Concern  . Not on file   Social History Narrative   Lives at [facility] group home.      Functional  Status Survey:    Allergies  Allergen Reactions  . Erythromycin Other (See Comments), Rash and Shortness Of Breath    Reaction:  Unknown  Reaction:  Unknown   . Albuterol Other (See Comments) and Rash    Reaction:Makes pt jittery  "Jittery" Reaction:  Makes pt jittery     Pertinent  Health Maintenance Due  Topic Date Due  . INFLUENZA VACCINE  12/29/2016  . PAP SMEAR  05/16/2017  . MAMMOGRAM  08/19/2017    Medications: Allergies as of 02/02/2017      Reactions   Erythromycin Other (See Comments), Rash, Shortness Of Breath   Reaction:  Unknown  Reaction:  Unknown    Albuterol Other (See Comments), Rash   Reaction:Makes pt jittery  "Jittery" Reaction:  Makes pt jittery       Medication List       Accurate as of 02/02/17  3:39 PM. Always use your most recent med list.          acetaminophen 325 MG tablet Commonly known as:  TYLENOL Take 650 mg by mouth 4 (four) times daily.   alendronate 70 MG tablet Commonly known as:  FOSAMAX Take 70 mg by mouth once a week. Take with a full glass of water on an empty stomach. On Wednesday   benzonatate 100 MG capsule Commonly known as:  TESSALON Take 100 mg by mouth 3 (three) times daily as needed for cough.   bisacodyl 5 MG EC tablet Commonly known as:  DULCOLAX Take 1 tablet (5 mg total) by mouth at bedtime.   budesonide-formoterol 160-4.5 MCG/ACT inhaler Commonly known as:  SYMBICORT Inhale 2 puffs into the lungs 2 (two) times daily.   calcium-vitamin D 500-200 MG-UNIT tablet Commonly known as:  OSCAL WITH D Take 1 tablet by mouth daily.   carbamide peroxide 6.5 % OTIC solution Commonly known as:  DEBROX Place 5 drops into both ears daily as needed.   cholecalciferol 1000 units tablet Commonly known as:  VITAMIN D Take 1 tablet (1,000 Units total) by mouth daily.   docusate sodium 100 MG capsule Commonly known as:  COLACE Take 100 mg by mouth every other day.   folic acid 1 MG tablet Commonly known as:   FOLVITE TAKE ONE TABLET BY MOUTH ONCE A DAY. (FOLIC ACID SUPPLEMENT)   gentamicin ointment 0.1 % Commonly known as:  GARAMYCIN Apply 1 application topically 3 (three) times daily as needed (for rash/boils).   guaifenesin 400 MG Tabs tablet Commonly known as:  HUMIBID E Take 400 mg by mouth every 4 (four) hours as needed (for congestion).   guaiFENesin 100 MG/5ML liquid Commonly known as:  ROBITUSSIN Take 100 mg by mouth every 4 (four) hours as needed (for cough or to loosen phlegm).   ketoconazole 2 % cream Commonly known as:  NIZORAL Apply 1  application topically daily. Apply to feet or affected area   ketotifen 0.025 % ophthalmic solution Commonly known as:  ZADITOR Place 1 drop into both eyes 2 (two) times daily as needed (for allergiesgu).   lactulose 10 GM/15ML solution Commonly known as:  CHRONULAC Take 45 g by mouth daily as needed for mild constipation.   levalbuterol 0.63 MG/3ML nebulizer solution Commonly known as:  XOPENEX Take 3 mLs (0.63 mg total) by nebulization 3 (three) times daily as needed for wheezing or shortness of breath.   levothyroxine 125 MCG tablet Commonly known as:  SYNTHROID, LEVOTHROID TAKE ONE TABLET BY MOUTH BEFORE BREAKFAST   methotrexate 2.5 MG tablet Commonly known as:  RHEUMATREX TAKE 7 TABLETS (17.5MG) BY MOUTH ONCE A WEEK (CAUTION:CHEMOTHERAPY, PROTECT FROM LIGHT)   montelukast 10 MG tablet Commonly known as:  SINGULAIR Take 1 tablet (10 mg total) by mouth at bedtime.   omeprazole 20 MG capsule Commonly known as:  PRILOSEC Take 1 capsule (20 mg total) by mouth 2 (two) times daily.   ondansetron 4 MG tablet Commonly known as:  ZOFRAN Take 4 mg by mouth every 8 (eight) hours as needed for nausea or vomiting.   ONE-A-DAY VITACRAVES ADULT Chew Chew 2 tablets by mouth daily.   predniSONE 2.5 MG tablet Commonly known as:  DELTASONE Take 2.5 mg by mouth daily with breakfast.   senna 8.6 MG tablet Commonly known as:   SENEXON Take 1 tablet (8.6 mg total) by mouth at bedtime.   traMADol 50 MG tablet Commonly known as:  ULTRAM Take 50 mg by mouth every 4 (four) hours as needed for moderate pain or severe pain. Ok to give in addition to scheduled doses for severe pain       Review of Systems  Constitutional: Negative for activity change, appetite change, chills, diaphoresis and fever.  HENT: Negative for congestion, sneezing, sore throat, trouble swallowing and voice change.   Respiratory: Negative for apnea, cough, choking, chest tightness, shortness of breath and wheezing.   Cardiovascular: Negative for chest pain, palpitations and leg swelling.  Gastrointestinal: Negative for abdominal distention, abdominal pain, constipation, diarrhea and nausea.  Genitourinary: Negative for difficulty urinating, dysuria, frequency and urgency.  Musculoskeletal: Positive for arthralgias (typical arthritis). Negative for back pain, gait problem and myalgias.  Skin: Positive for wound. Negative for color change, pallor and rash.  Neurological: Negative for dizziness, tremors, syncope, speech difficulty, weakness, numbness and headaches.  Psychiatric/Behavioral: Negative for agitation and behavioral problems.  All other systems reviewed and are negative.   Vitals:   02/02/17 1534  BP: (!) 97/58  Pulse: 70  Resp: 17  Temp: 98.9 F (37.2 C)  SpO2: 98%  Weight: 174 lb 12.8 oz (79.3 kg)  Height: 4' 9"  (1.448 m)   Body mass index is 37.83 kg/m. Physical Exam  Constitutional: She is oriented to person, place, and time. Vital signs are normal. She appears well-developed and well-nourished. She is active and cooperative. She does not appear ill. No distress.  HENT:  Head: Normocephalic and atraumatic.  Mouth/Throat: Uvula is midline, oropharynx is clear and moist and mucous membranes are normal. Mucous membranes are not pale, not dry and not cyanotic.  Eyes: Pupils are equal, round, and reactive to light.  Conjunctivae, EOM and lids are normal.  Neck: Trachea normal, normal range of motion and full passive range of motion without pain. Neck supple. No JVD present. No tracheal deviation, no edema and no erythema present. No thyromegaly present.  Cardiovascular: Normal rate, regular  rhythm, normal heart sounds, intact distal pulses and normal pulses.  Exam reveals no gallop, no distant heart sounds and no friction rub.   No murmur heard. Pulses:      Dorsalis pedis pulses are 2+ on the right side, and 2+ on the left side.  Pulmonary/Chest: Effort normal and breath sounds normal. No accessory muscle usage. No respiratory distress. She has no decreased breath sounds. She has no wheezes. She has no rhonchi. She has no rales. She exhibits no tenderness.  Abdominal: Normal appearance and bowel sounds are normal. She exhibits no distension and no ascites. There is no tenderness.  Musculoskeletal: She exhibits no edema.       Left knee: She exhibits decreased range of motion and laceration. Tenderness found.  Expected osteoarthritis, stiffness; Calves soft, supple. Negative Homan's sign  Neurological: She is alert and oriented to person, place, and time. She has normal strength.  Downes Syndrome- High functioning  Skin: Skin is warm and dry. Laceration (left knee) noted. She is not diaphoretic. No cyanosis. No pallor. Nails show no clubbing.  Psychiatric: She has a normal mood and affect. Her speech is normal and behavior is normal. Judgment and thought content normal. Cognition and memory are normal.  Nursing note and vitals reviewed.   Labs reviewed: Basic Metabolic Panel:  Recent Labs  06/07/16 1630  01/05/17 1226 01/11/17 0423 01/12/17 0407  NA 145  < > 138 140 140  K 4.8  < > 4.0 4.1 4.0  CL 105  < > 99 105 104  CO2 32  < > 34* 30 30  GLUCOSE 112*  < > 99 137* 94  BUN 14  < > 13 8 12   CREATININE 0.86  < > 1.01 0.69 0.91  CALCIUM 9.3  < > 9.6 7.8* 8.3*  MG 2.4  --   --   --   --   < > =  values in this interval not displayed. Liver Function Tests:  Recent Labs  06/07/16 1630 12/29/16 0948 01/05/17 1226  AST 31 34 27  ALT 27 21 28   ALKPHOS 73 79 87  BILITOT 0.3 0.5 0.4  PROT 6.7 7.1 7.4  ALBUMIN 3.9 3.7 4.3    Recent Labs  05/17/16 0558  LIPASE 15   No results for input(s): AMMONIA in the last 8760 hours. CBC:  Recent Labs  06/07/16 1630  12/31/16 1755 01/05/17 1226 01/11/17 0423 01/12/17 0407  WBC 5.6  < > 8.1 9.2 11.5* 9.3  NEUTROABS 4.2  --  7.7* 7.5  --   --   HGB 13.4  < > 12.8 14.0 11.1* 10.6*  HCT 39.5  < > 37.0 43.2 33.4* 31.4*  MCV 102.7*  < > 105.6* 107.8* 103.1* 103.1*  PLT 386.0  < > 296 410.0* 292 306  < > = values in this interval not displayed. Cardiac Enzymes:  Recent Labs  12/31/16 1755  TROPONINI <0.03   BNP: Invalid input(s): POCBNP CBG: No results for input(s): GLUCAP in the last 8760 hours.  Procedures and Imaging Studies During Stay: Dg Knee Left Port  Result Date: 01/10/2017 CLINICAL DATA:  Status post left total hip joint prosthesis placement. Immediate postoperative radiographs. EXAM: PORTABLE LEFT KNEE - 1-2 VIEW COMPARISON:  None in PACs FINDINGS: The patient has undergone left total knee joint prosthesis placement. Radiographic positioning of the prosthetic components is good. Surgical drain lines and skin staples are present. IMPRESSION: No immediate postprocedure complication following left total knee joint prosthesis placement. Electronically  Signed   By: David  Martinique M.D.   On: 01/10/2017 11:40    Assessment/Plan:   1. Primary osteoarthritis of left knee 2. Status post total left knee replacement  Continue PT/OT  Continue exercises as taught by PT/OT  Skin care per protocol   TEDs for edema control/ DVT prophylaxis  Continue Lovenox 30 mg SQ Q 12 hours for DVT prophylaxis  Continue APAP 650 mg po QID scheduled for pain  Continue Tramadol 50 mg po Q 4 hours prn pain  Follow up with Orthopedist  asap after discharge for continuity of care   Patient is being discharged with the following home health services: OPPT through [facility]    Patient is being discharged with the following durable medical equipment: RW    Patient has been advised to f/u with their PCP in 1-2 weeks to bring them up to date on their rehab stay.  Social services at facility was responsible for arranging this appointment.  Pt was provided with a 30 day supply of prescriptions for medications and refills must be obtained from their PCP.  For controlled substances, a more limited supply may be provided adequate until PCP appointment only.  Future labs/tests needed: per pcp  Family/ staff Communication:   Total Time:  Documentation:  Face to Face:  Family/Phone:  Vikki Ports, NP-C [facility] [address], [place] [address] Cell Phone (Mon-Fri 8am-5pm):  [phone] On Call:  [phone] & follow prompts after 5pm & weekends Office Phone:  [phone] Office Fax:  [phone]

## 2017-02-03 DIAGNOSIS — M6281 Muscle weakness (generalized): Secondary | ICD-10-CM | POA: Diagnosis not present

## 2017-02-03 DIAGNOSIS — R262 Difficulty in walking, not elsewhere classified: Secondary | ICD-10-CM | POA: Diagnosis not present

## 2017-02-03 DIAGNOSIS — Z96652 Presence of left artificial knee joint: Secondary | ICD-10-CM | POA: Diagnosis not present

## 2017-02-03 DIAGNOSIS — M25562 Pain in left knee: Secondary | ICD-10-CM | POA: Diagnosis not present

## 2017-02-04 DIAGNOSIS — M25562 Pain in left knee: Secondary | ICD-10-CM | POA: Diagnosis not present

## 2017-02-07 DIAGNOSIS — M25562 Pain in left knee: Secondary | ICD-10-CM | POA: Diagnosis not present

## 2017-02-14 DIAGNOSIS — M25562 Pain in left knee: Secondary | ICD-10-CM | POA: Diagnosis not present

## 2017-02-16 DIAGNOSIS — M25562 Pain in left knee: Secondary | ICD-10-CM | POA: Diagnosis not present

## 2017-02-21 DIAGNOSIS — M25562 Pain in left knee: Secondary | ICD-10-CM | POA: Diagnosis not present

## 2017-02-22 DIAGNOSIS — M79675 Pain in left toe(s): Secondary | ICD-10-CM | POA: Diagnosis not present

## 2017-02-22 DIAGNOSIS — M79674 Pain in right toe(s): Secondary | ICD-10-CM | POA: Diagnosis not present

## 2017-02-22 DIAGNOSIS — Z96652 Presence of left artificial knee joint: Secondary | ICD-10-CM | POA: Diagnosis not present

## 2017-02-22 DIAGNOSIS — B351 Tinea unguium: Secondary | ICD-10-CM | POA: Diagnosis not present

## 2017-02-22 DIAGNOSIS — M2041 Other hammer toe(s) (acquired), right foot: Secondary | ICD-10-CM | POA: Diagnosis not present

## 2017-02-22 DIAGNOSIS — B353 Tinea pedis: Secondary | ICD-10-CM | POA: Diagnosis not present

## 2017-02-22 DIAGNOSIS — M2042 Other hammer toe(s) (acquired), left foot: Secondary | ICD-10-CM | POA: Diagnosis not present

## 2017-02-23 DIAGNOSIS — M25562 Pain in left knee: Secondary | ICD-10-CM | POA: Diagnosis not present

## 2017-02-25 DIAGNOSIS — M25562 Pain in left knee: Secondary | ICD-10-CM | POA: Diagnosis not present

## 2017-02-28 DIAGNOSIS — M25562 Pain in left knee: Secondary | ICD-10-CM | POA: Diagnosis not present

## 2017-03-04 DIAGNOSIS — M25562 Pain in left knee: Secondary | ICD-10-CM | POA: Diagnosis not present

## 2017-03-09 DIAGNOSIS — M25562 Pain in left knee: Secondary | ICD-10-CM | POA: Diagnosis not present

## 2017-03-11 DIAGNOSIS — M25562 Pain in left knee: Secondary | ICD-10-CM | POA: Diagnosis not present

## 2017-03-14 ENCOUNTER — Telehealth: Payer: Self-pay | Admitting: Internal Medicine

## 2017-03-14 ENCOUNTER — Other Ambulatory Visit: Payer: Self-pay | Admitting: *Deleted

## 2017-03-14 DIAGNOSIS — M25562 Pain in left knee: Secondary | ICD-10-CM | POA: Diagnosis not present

## 2017-03-14 MED ORDER — LEVOTHYROXINE SODIUM 125 MCG PO TABS: 125.0000 ug | ORAL_TABLET | Freq: Every day | ORAL | 10 refills | Status: DC

## 2017-03-14 MED ORDER — DOCUSATE SODIUM 100 MG PO CAPS: 100.0000 mg | ORAL_CAPSULE | ORAL | 5 refills | Status: DC

## 2017-03-14 MED ORDER — CALCIUM CARBONATE-VITAMIN D 500-200 MG-UNIT PO TABS: 1.0000 | ORAL_TABLET | Freq: Every day | ORAL | 5 refills | Status: DC

## 2017-03-14 MED ORDER — OMEPRAZOLE 20 MG PO CPDR: 20.0000 mg | DELAYED_RELEASE_CAPSULE | Freq: Two times a day (BID) | ORAL | 5 refills | Status: DC

## 2017-03-14 MED ORDER — BUDESONIDE-FORMOTEROL FUMARATE 160-4.5 MCG/ACT IN AERO: 2.0000 | INHALATION_SPRAY | Freq: Two times a day (BID) | RESPIRATORY_TRACT | 3 refills | Status: DC

## 2017-03-14 MED ORDER — ACETAMINOPHEN 325 MG PO TABS: 650.0000 mg | ORAL_TABLET | Freq: Four times a day (QID) | ORAL | 5 refills | Status: DC

## 2017-03-14 MED ORDER — METHOTREXATE 2.5 MG PO TABS: ORAL_TABLET | ORAL | 0 refills | Status: DC

## 2017-03-14 MED ORDER — FOLIC ACID 1 MG PO TABS: ORAL_TABLET | ORAL | 10 refills | Status: DC

## 2017-03-14 MED ORDER — PREDNISONE 2.5 MG PO TABS: 2.5000 mg | ORAL_TABLET | Freq: Every day | ORAL | 2 refills | Status: DC

## 2017-03-14 MED ORDER — SENNOSIDES 8.6 MG PO TABS: 1.0000 | ORAL_TABLET | Freq: Every day | ORAL | 3 refills | Status: DC

## 2017-03-14 MED ORDER — VITAMIN D 1000 UNITS PO TABS
1000.0000 [IU] | ORAL_TABLET | Freq: Every day | ORAL | 5 refills | Status: DC
Start: 2017-03-14 — End: 2017-11-30

## 2017-03-14 MED ORDER — ALENDRONATE SODIUM 70 MG PO TABS
70.0000 mg | ORAL_TABLET | ORAL | 3 refills | Status: DC
Start: 2017-03-14 — End: 2017-09-28

## 2017-03-14 MED ORDER — ONDANSETRON HCL 4 MG PO TABS: 4.0000 mg | ORAL_TABLET | Freq: Three times a day (TID) | ORAL | 5 refills | Status: DC | PRN

## 2017-03-14 MED ORDER — BISACODYL 5 MG PO TBEC: 5.0000 mg | DELAYED_RELEASE_TABLET | Freq: Every day | ORAL | 3 refills | Status: DC

## 2017-03-14 MED ORDER — MONTELUKAST SODIUM 10 MG PO TABS: 10.0000 mg | ORAL_TABLET | Freq: Every day | ORAL | 3 refills | Status: DC

## 2017-03-14 NOTE — Progress Notes (Unsigned)
I have refilled all medications requested for refills except prednisone, and Zofran ok to fill?

## 2017-03-14 NOTE — Telephone Encounter (Signed)
done

## 2017-03-14 NOTE — Telephone Encounter (Signed)
Pharmacy requesting refill on prednisone and Zofran ok to fill?

## 2017-03-14 NOTE — Telephone Encounter (Signed)
Pt left pinky toe is red and pt mom wanted her seen this week.  Please advise  Pt mom at [phone] or  Ms.Clegg at [employer])

## 2017-03-14 NOTE — Telephone Encounter (Signed)
Patient scheduled.

## 2017-03-14 NOTE — Telephone Encounter (Signed)
Please schedule patient for 10/16 at 1:30 for left pinky toe pain/red per moms request. Patients mom is aware of date and time.

## 2017-03-15 ENCOUNTER — Ambulatory Visit (INDEPENDENT_AMBULATORY_CARE_PROVIDER_SITE_OTHER): Payer: Medicare Other | Admitting: Internal Medicine

## 2017-03-15 ENCOUNTER — Ambulatory Visit (INDEPENDENT_AMBULATORY_CARE_PROVIDER_SITE_OTHER): Payer: Medicare Other

## 2017-03-15 ENCOUNTER — Encounter: Payer: Self-pay | Admitting: Internal Medicine

## 2017-03-15 VITALS — BP 122/66 | HR 84 | Temp 98.6°F | Resp 15 | Ht <= 58 in | Wt 172.4 lb

## 2017-03-15 DIAGNOSIS — Z96652 Presence of left artificial knee joint: Secondary | ICD-10-CM

## 2017-03-15 DIAGNOSIS — Z23 Encounter for immunization: Secondary | ICD-10-CM | POA: Diagnosis not present

## 2017-03-15 DIAGNOSIS — M810 Age-related osteoporosis without current pathological fracture: Secondary | ICD-10-CM | POA: Diagnosis not present

## 2017-03-15 DIAGNOSIS — E559 Vitamin D deficiency, unspecified: Secondary | ICD-10-CM | POA: Diagnosis not present

## 2017-03-15 DIAGNOSIS — M79672 Pain in left foot: Secondary | ICD-10-CM | POA: Insufficient documentation

## 2017-03-15 LAB — BASIC METABOLIC PANEL
BUN: 16 mg/dL (ref 6–23)
CALCIUM: 9 mg/dL (ref 8.4–10.5)
CO2: 28 mEq/L (ref 19–32)
CREATININE: 0.93 mg/dL (ref 0.40–1.20)
Chloride: 105 mEq/L (ref 96–112)
GFR: 68.23 mL/min (ref >60.00)
GLUCOSE: 115 mg/dL — AB (ref 70–99)
Potassium: 4.3 mEq/L (ref 3.5–5.1)
SODIUM: 141 meq/L (ref 135–145)

## 2017-03-15 LAB — SEDIMENTATION RATE: Sed Rate: 41 mm/hr — ABNORMAL HIGH (ref 0–20)

## 2017-03-15 LAB — VITAMIN D 25 HYDROXY (VIT D DEFICIENCY, FRACTURES): VITD: 34.57 ng/mL (ref 30.00–100.00)

## 2017-03-15 LAB — URIC ACID: Uric Acid, Serum: 5 mg/dL (ref 2.4–7.0)

## 2017-03-15 NOTE — Assessment & Plan Note (Signed)
checing ESR  Uric acid  Palin films

## 2017-03-15 NOTE — Assessment & Plan Note (Addendum)
Taking alendronate.  needs vitamin d checked

## 2017-03-15 NOTE — Progress Notes (Signed)
Subjective:  Patient ID: Cindy Oconnor, female    DOB: Oct 26, 1970  Age: 46 y.o. MRN: [id number]  CC: The primary encounter diagnosis was Acute foot pain, left. Diagnoses of Hypocalcemia, Vitamin D deficiency, Osteoporosis, unspecified osteoporosis type, unspecified pathological fracture presence, Need for immunization against influenza, and Status post total left knee replacement were also pertinent to this visit.  HPI Cindy Oconnor presents for acute left foot pain involving the lateral side .  Patient has Down's Syndrome lives in group home,  Is accompanied by mother, but is interactive and coherent.   Pain has been present  Since  last Friday ,  4 days ago .  No history of falls, but she has been standding on toes during outpatient PT for knee replacement rehab at [facility] since coming home from inpatient rehab  [facility] 3 weeks ago.  Mother noticed  Significant swelling of ankle and foot Friday evening without bruising. sewlling has resolved  Knee  Is painless now per patient.  "I can walk again."    Outpatient Medications Prior to Visit  Medication Sig Dispense Refill  . acetaminophen (TYLENOL) 325 MG tablet Take 2 tablets (650 mg total) by mouth 4 (four) times daily. 30 tablet 5  . alendronate (FOSAMAX) 70 MG tablet Take 1 tablet (70 mg total) by mouth once a week. Take with a full glass of water on an empty stomach. On Wednesday 4 tablet 3  . benzonatate (TESSALON) 100 MG capsule Take 100 mg by mouth 3 (three) times daily as needed for cough.    . bisacodyl (DULCOLAX) 5 MG EC tablet Take 1 tablet (5 mg total) by mouth at bedtime. 90 tablet 3  . budesonide-formoterol (SYMBICORT) 160-4.5 MCG/ACT inhaler Inhale 2 puffs into the lungs 2 (two) times daily. 1 Inhaler 3  . calcium-vitamin D (OSCAL WITH D) 500-200 MG-UNIT tablet Take 1 tablet by mouth daily with breakfast. 30 tablet 5  . carbamide peroxide (DEBROX) 6.5 % OTIC solution Place 5 drops into both ears daily as  needed.    . cholecalciferol (VITAMIN D) 1000 units tablet Take 1 tablet (1,000 Units total) by mouth daily. 30 tablet 5  . docusate sodium (COLACE) 100 MG capsule Take 1 capsule (100 mg total) by mouth every other day. 15 capsule 5  . folic acid (FOLVITE) 1 MG tablet TAKE ONE TABLET BY MOUTH ONCE A DAY. (FOLIC ACID SUPPLEMENT) 30 tablet 10  . gentamicin ointment (GARAMYCIN) 0.1 % Apply 1 application topically 3 (three) times daily as needed (for rash/boils).    Marland Kitchen guaifenesin (HUMIBID E) 400 MG TABS tablet Take 400 mg by mouth every 4 (four) hours as needed (for congestion).    Marland Kitchen guaiFENesin (ROBITUSSIN) 100 MG/5ML liquid Take 100 mg by mouth every 4 (four) hours as needed (for cough or to loosen phlegm).    Marland Kitchen ketoconazole (NIZORAL) 2 % cream Apply 1 application topically daily. Apply to feet or affected area    . ketotifen (ZADITOR) 0.025 % ophthalmic solution Place 1 drop into both eyes 2 (two) times daily as needed (for allergiesgu).     Marland Kitchen lactulose (CHRONULAC) 10 GM/15ML solution Take 45 g by mouth daily as needed for mild constipation.     Marland Kitchen levalbuterol (XOPENEX) 0.63 MG/3ML nebulizer solution Take 3 mLs (0.63 mg total) by nebulization 3 (three) times daily as needed for wheezing or shortness of breath. 3 mL 6  . levothyroxine (SYNTHROID, LEVOTHROID) 125 MCG tablet Take 1 tablet (125 mcg total) by mouth  daily before breakfast. 30 tablet 10  . methotrexate (RHEUMATREX) 2.5 MG tablet TAKE 7 TABLETS (17.5MG) BY MOUTH ONCE A WEEK (CAUTION:CHEMOTHERAPY, PROTECT FROM LIGHT) 28 tablet 0  . montelukast (SINGULAIR) 10 MG tablet Take 1 tablet (10 mg total) by mouth at bedtime. 30 tablet 3  . Multiple Vitamins-Minerals (ONE-A-DAY VITACRAVES ADULT) CHEW Chew 2 tablets by mouth daily.    Marland Kitchen omeprazole (PRILOSEC) 20 MG capsule Take 1 capsule (20 mg total) by mouth 2 (two) times daily. 60 capsule 5  . ondansetron (ZOFRAN) 4 MG tablet Take 1 tablet (4 mg total) by mouth every 8 (eight) hours as needed for  nausea or vomiting. 20 tablet 5  . predniSONE (DELTASONE) 2.5 MG tablet Take 1 tablet (2.5 mg total) by mouth daily with breakfast. 90 tablet 2  . senna (SENEXON) 8.6 MG tablet Take 1 tablet (8.6 mg total) by mouth at bedtime. 30 tablet 3  . traMADol (ULTRAM) 50 MG tablet Take 50 mg by mouth every 4 (four) hours as needed for moderate pain or severe pain. Ok to give in addition to scheduled doses for severe pain     No facility-administered medications prior to visit.     Review of Systems;  Patient denies headache, fevers, malaise, unintentional weight loss, skin rash, eye pain, sinus congestion and sinus pain, sore throat, dysphagia,  hemoptysis , cough, dyspnea, wheezing, chest pain, palpitations, orthopnea, edema, abdominal pain, nausea, melena, diarrhea, constipation, flank pain, dysuria, hematuria, urinary  Frequency, nocturia, numbness, tingling, seizures,  Focal weakness, Loss of consciousness,  Tremor, insomnia, depression, anxiety, and suicidal ideation.      Objective:  BP 122/66 (BP Location: Left Arm, Patient Position: Sitting, Cuff Size: Normal)   Pulse 84   Temp 98.6 F (37 C) (Oral)   Resp 15   Ht _0  (1.448 m)   Wt 172 lb 6.4 oz (78.2 kg)   LMP 04/11/1981   SpO2 97%   BMI 37.31 kg/m   BP Readings from Last 3 Encounters:  03/15/17 122/66  02/02/17 (!) 97/58  01/26/17 (!) 110/52    Wt Readings from Last 3 Encounters:  03/15/17 172 lb 6.4 oz (78.2 kg)  02/02/17 174 lb 12.8 oz (79.3 kg)  01/26/17 171 lb 6.4 oz (77.7 kg)    General appearance: alert, cooperative and appears stated age Back: symmetric, no curvature. ROM normal. No CVA tenderness. Lungs: clear to auscultation bilaterally Heart: regular rate and rhythm, S1, S2 normal, no murmur, click, rub or gallop Abdomen: soft, non-tender; bowel sounds normal; no masses,  no organomegaly Pulses: 2+ and symmetric Skin: Skin color, texture, turgor normal. No rashes or lesions Ext: left knee with well healed  surgical mmidline incision,  No edema or warmth .  left foot with eczema, no cellulitis,  Bruising.  MSK: left 5th metatarsal tender to pressure  Lymph nodes: Cervical, supraclavicular, and axillary nodes normal.  Lab Results  Component Value Date   HGBA1C 5.7 06/27/2013    Lab Results  Component Value Date   CREATININE 0.91 01/12/2017   CREATININE 0.69 01/11/2017   CREATININE 1.01 01/05/2017    Lab Results  Component Value Date   WBC 9.3 01/12/2017   HGB 10.6 (L) 01/12/2017   HCT 31.4 (L) 01/12/2017   PLT 306 01/12/2017   GLUCOSE 94 01/12/2017   CHOL 174 05/19/2015   TRIG 97.0 05/19/2015   HDL 47.50 05/19/2015   LDLCALC 107 (H) 05/19/2015   ALT 28 01/05/2017   AST 27 01/05/2017   NA  140 01/12/2017   K 4.0 01/12/2017   CL 104 01/12/2017   CREATININE 0.91 01/12/2017   BUN 12 01/12/2017   CO2 30 01/12/2017   TSH 4.40 06/07/2016   INR 1.03 12/29/2016   HGBA1C 5.7 06/27/2013    No results found.  Assessment & Plan:   Problem List Items Addressed This Visit    Acute foot pain, left - Primary    checing ESR  Uric acid  Palin films       Relevant Orders   DG Foot Complete Left   Sedimentation rate   Uric acid   Hypocalcemia    Checking PTH and calcium      Relevant Orders   PTH, intact and calcium   Basic metabolic panel   Osteoporosis    Taking alendronate.  needs vitamin d checked       S/P total knee arthroplasty    Left knee replacement was successful, and patient is pain free and ambulating well.  Still enrolled In outpatient PT>        Other Visit Diagnoses    Vitamin D deficiency       Relevant Orders   VITAMIN D 25 Hydroxy (Vit-D Deficiency, Fractures)   Need for immunization against influenza       Relevant Orders   Flu Vaccine QUAD 36+ mos IM (Completed)     A total of 25 minutes of face to face time was spent with patient more than half of which was spent in counselling about the above mentioned conditions  and coordination of care  I  am having Ms. Saunders maintain her lactulose, levalbuterol, ketotifen, gentamicin ointment, guaifenesin, guaiFENesin, ONE-A-DAY VITACRAVES ADULT, carbamide peroxide, ketoconazole, benzonatate, traMADol, montelukast, methotrexate, omeprazole, cholecalciferol, budesonide-formoterol, senna, calcium-vitamin D, levothyroxine, folic acid, docusate sodium, acetaminophen, bisacodyl, alendronate, ondansetron, and predniSONE.  No orders of the defined types were placed in this encounter.   There are no discontinued medications.  Follow-up: No Follow-up on file.   Crecencio Mc, MD

## 2017-03-15 NOTE — Assessment & Plan Note (Signed)
Left knee replacement was successful, and patient is pain free and ambulating well.  Still enrolled In outpatient PT>

## 2017-03-15 NOTE — Assessment & Plan Note (Signed)
Checking PTH and calcium

## 2017-03-16 ENCOUNTER — Telehealth: Payer: Self-pay | Admitting: Internal Medicine

## 2017-03-16 DIAGNOSIS — M25562 Pain in left knee: Secondary | ICD-10-CM | POA: Diagnosis not present

## 2017-03-16 LAB — PTH, INTACT AND CALCIUM
Calcium: 8.9 mg/dL (ref 8.6–10.2)
PTH: 67 pg/mL — AB (ref 14–64)

## 2017-03-16 NOTE — Telephone Encounter (Signed)
Pt mom called to get results from xray of left foot. Please advise?  Call mom @ [phone]. Thank you!

## 2017-03-17 NOTE — Telephone Encounter (Signed)
See result note message 

## 2017-03-18 DIAGNOSIS — M25562 Pain in left knee: Secondary | ICD-10-CM | POA: Diagnosis not present

## 2017-03-21 DIAGNOSIS — M25562 Pain in left knee: Secondary | ICD-10-CM | POA: Diagnosis not present

## 2017-03-23 DIAGNOSIS — M25562 Pain in left knee: Secondary | ICD-10-CM | POA: Diagnosis not present

## 2017-03-24 ENCOUNTER — Telehealth: Payer: Self-pay | Admitting: Internal Medicine

## 2017-03-24 ENCOUNTER — Other Ambulatory Visit: Payer: Self-pay

## 2017-03-24 MED ORDER — ONDANSETRON HCL 4 MG PO TABS: 4.0000 mg | ORAL_TABLET | Freq: Three times a day (TID) | ORAL | 5 refills | Status: DC | PRN

## 2017-03-24 MED ORDER — ACETAMINOPHEN 325 MG PO TABS: 650.0000 mg | ORAL_TABLET | Freq: Four times a day (QID) | ORAL | 5 refills | Status: DC

## 2017-03-24 NOTE — Telephone Encounter (Signed)
Pharmacy called and is needing clarification on pt's ondansetron (ZOFRAN) 4 MG tablet odt. They state that they received a fax that pt is longer a pt of Dr. Derrel Nip, but they received an rx for ondansetron (ZOFRAN) 4 MG tablet. Please advise, thank you!  Call [phone]

## 2017-03-25 ENCOUNTER — Other Ambulatory Visit: Payer: Self-pay | Admitting: Internal Medicine

## 2017-03-25 DIAGNOSIS — M25562 Pain in left knee: Secondary | ICD-10-CM | POA: Diagnosis not present

## 2017-03-25 NOTE — Telephone Encounter (Signed)
Spoke with Jeani Hawking at pharmacy and informed her that the pt is still a pt of Dr. Lupita Dawn and gave her the okay to d/c the zofran prescribed by Dr. Ouida Sills since Dr. Derrel Nip is now refilling the medication.

## 2017-03-28 DIAGNOSIS — H1013 Acute atopic conjunctivitis, bilateral: Secondary | ICD-10-CM | POA: Diagnosis not present

## 2017-03-28 DIAGNOSIS — H02051 Trichiasis without entropian right upper eyelid: Secondary | ICD-10-CM | POA: Diagnosis not present

## 2017-03-28 DIAGNOSIS — M25562 Pain in left knee: Secondary | ICD-10-CM | POA: Diagnosis not present

## 2017-04-01 DIAGNOSIS — M25562 Pain in left knee: Secondary | ICD-10-CM | POA: Diagnosis not present

## 2017-04-06 DIAGNOSIS — M25562 Pain in left knee: Secondary | ICD-10-CM | POA: Diagnosis not present

## 2017-04-08 DIAGNOSIS — M25562 Pain in left knee: Secondary | ICD-10-CM | POA: Diagnosis not present

## 2017-04-13 DIAGNOSIS — M25562 Pain in left knee: Secondary | ICD-10-CM | POA: Diagnosis not present

## 2017-04-20 DIAGNOSIS — M25562 Pain in left knee: Secondary | ICD-10-CM | POA: Diagnosis not present

## 2017-04-26 ENCOUNTER — Other Ambulatory Visit: Payer: Self-pay | Admitting: Internal Medicine

## 2017-04-26 DIAGNOSIS — M25562 Pain in left knee: Secondary | ICD-10-CM | POA: Diagnosis not present

## 2017-04-26 DIAGNOSIS — Z79899 Other long term (current) drug therapy: Secondary | ICD-10-CM

## 2017-04-26 NOTE — Telephone Encounter (Signed)
Refilled: 03/14/2017 Last OV: 03/15/2017 Next OV: not scheduled

## 2017-04-28 DIAGNOSIS — M25562 Pain in left knee: Secondary | ICD-10-CM | POA: Diagnosis not present

## 2017-04-28 NOTE — Telephone Encounter (Signed)
NEEDS CMET AND CBC AS SOON AS MOM HAS TIME TO GET HER OVER HERE LIVER FUNCTION HAS NOT BEEN CHECKED SINCE AUGUST.  SHOUD BE DONE  EVERY 3 MONTHS

## 2017-05-20 ENCOUNTER — Other Ambulatory Visit: Payer: Self-pay | Admitting: Internal Medicine

## 2017-06-02 DIAGNOSIS — M25561 Pain in right knee: Secondary | ICD-10-CM | POA: Diagnosis not present

## 2017-06-02 DIAGNOSIS — M1711 Unilateral primary osteoarthritis, right knee: Secondary | ICD-10-CM | POA: Insufficient documentation

## 2017-06-03 NOTE — Telephone Encounter (Signed)
Spoke with pt's mother and lab appt has been scheduled. The mother is aware of the appt date and time.

## 2017-06-06 ENCOUNTER — Telehealth: Payer: Self-pay | Admitting: Internal Medicine

## 2017-06-06 ENCOUNTER — Other Ambulatory Visit (INDEPENDENT_AMBULATORY_CARE_PROVIDER_SITE_OTHER): Payer: Medicare Other

## 2017-06-06 DIAGNOSIS — D7589 Other specified diseases of blood and blood-forming organs: Secondary | ICD-10-CM

## 2017-06-06 DIAGNOSIS — Z79899 Other long term (current) drug therapy: Secondary | ICD-10-CM

## 2017-06-06 LAB — CBC WITH DIFFERENTIAL/PLATELET
BASOS ABS: 0.1 10*3/uL (ref 0.0–0.1)
Basophils Relative: 1.4 % (ref 0.0–3.0)
EOS ABS: 0.1 10*3/uL (ref 0.0–0.7)
Eosinophils Relative: 1.7 % (ref 0.0–5.0)
HEMATOCRIT: 42.1 % (ref 36.0–46.0)
HEMOGLOBIN: 13.8 g/dL (ref 12.0–15.0)
LYMPHS PCT: 16 % (ref 12.0–46.0)
Lymphs Abs: 0.9 10*3/uL (ref 0.7–4.0)
MCHC: 32.7 g/dL (ref 30.0–36.0)
MCV: 100.9 fl — AB (ref 78.0–100.0)
MONOS PCT: 6.1 % (ref 3.0–12.0)
Monocytes Absolute: 0.4 10*3/uL (ref 0.1–1.0)
NEUTROS ABS: 4.4 10*3/uL (ref 1.4–7.7)
Neutrophils Relative %: 74.8 % (ref 43.0–77.0)
PLATELETS: 325 10*3/uL (ref 150.0–400.0)
RBC: 4.18 Mil/uL (ref 3.87–5.11)
RDW: 22.3 % — ABNORMAL HIGH (ref 11.5–15.5)
WBC: 5.9 10*3/uL (ref 4.0–10.5)

## 2017-06-06 LAB — COMPREHENSIVE METABOLIC PANEL
ALK PHOS: 61 U/L (ref 39–117)
ALT: 24 U/L (ref 0–35)
AST: 35 U/L (ref 0–37)
Albumin: 3.9 g/dL (ref 3.5–5.2)
BUN: 18 mg/dL (ref 6–23)
CALCIUM: 9.2 mg/dL (ref 8.4–10.5)
CO2: 31 meq/L (ref 19–32)
CREATININE: 0.81 mg/dL (ref 0.40–1.20)
Chloride: 101 mEq/L (ref 96–112)
GFR: 79.94 mL/min (ref >60.00)
Glucose, Bld: 60 mg/dL — ABNORMAL LOW (ref 70–99)
Potassium: 4.1 mEq/L (ref 3.5–5.1)
Sodium: 140 mEq/L (ref 135–145)
TOTAL PROTEIN: 6.9 g/dL (ref 6.0–8.3)
Total Bilirubin: 0.4 mg/dL (ref 0.2–1.2)

## 2017-06-06 LAB — VITAMIN B12: Vitamin B-12: 260 pg/mL (ref 211–911)

## 2017-06-06 NOTE — Telephone Encounter (Signed)
Pt's mother wanted Dr. Derrel Nip to know that patient will be having right knee surgery sometime in March or sooner.

## 2017-06-07 NOTE — Telephone Encounter (Signed)
FYI

## 2017-06-09 ENCOUNTER — Telehealth: Payer: Self-pay | Admitting: Internal Medicine

## 2017-06-09 NOTE — Telephone Encounter (Signed)
Patient's mother got message regarding B12 and she understands. She is hoping that it will help with her daughter's energy level.  She did have an additional question- She wanted to make sure she was to continue the methotrexate. You may call and leave a message on her VM. [phone]

## 2017-06-10 NOTE — Telephone Encounter (Signed)
LMTCB

## 2017-06-13 NOTE — Telephone Encounter (Signed)
Patients mom is aware.

## 2017-06-13 NOTE — Telephone Encounter (Signed)
Please advise 

## 2017-06-13 NOTE — Addendum Note (Signed)
Addended by: Elpidio Galea T on: 06/13/2017 11:27 AM   Modules accepted: Orders

## 2017-06-13 NOTE — Telephone Encounter (Signed)
Pt's mom called back and said she does need a refill on her methotrexate (RHEUMATREX) 2.5 MG tablet. Please send to [facility], [place]

## 2017-06-13 NOTE — Telephone Encounter (Signed)
Spoke with pt's mother and informed her per Dr. Derrel Nip that the vitamin b12 injections will not interfere with the pt's methotrexate. The mother stated that wasn't what she was really calling about. She stated that she needed to know if the pt was to still be taking the methotrexate and she was informed that yes she should still be taking it. Asked her if the pt needed a refill and she stated that she wasn't sure but that she would check with the group home to find out and then she would give [place] a call back.

## 2017-06-22 ENCOUNTER — Ambulatory Visit (INDEPENDENT_AMBULATORY_CARE_PROVIDER_SITE_OTHER): Payer: Medicare Other | Admitting: Internal Medicine

## 2017-06-22 ENCOUNTER — Encounter: Payer: Self-pay | Admitting: Internal Medicine

## 2017-06-22 DIAGNOSIS — E538 Deficiency of other specified B group vitamins: Secondary | ICD-10-CM | POA: Diagnosis not present

## 2017-06-22 DIAGNOSIS — M1990 Unspecified osteoarthritis, unspecified site: Secondary | ICD-10-CM

## 2017-06-22 DIAGNOSIS — D7589 Other specified diseases of blood and blood-forming organs: Secondary | ICD-10-CM | POA: Diagnosis not present

## 2017-06-22 DIAGNOSIS — M810 Age-related osteoporosis without current pathological fracture: Secondary | ICD-10-CM | POA: Diagnosis not present

## 2017-06-22 MED ORDER — "SYRINGE 25G X 1"" 3 ML MISC": 0 refills | Status: DC

## 2017-06-22 MED ORDER — CYANOCOBALAMIN 1000 MCG/ML IJ SOLN
1000.0000 ug | Freq: Once | INTRAMUSCULAR | Status: AC
  Administered 2017-06-22: 1000 ug via INTRAMUSCULAR

## 2017-06-22 MED ORDER — CYANOCOBALAMIN 1000 MCG/ML IJ SOLN: 1000.0000 ug | INTRAMUSCULAR | 0 refills | Status: DC

## 2017-06-22 NOTE — Progress Notes (Signed)
Patient ID: Cindy Oconnor, female    DOB: 04-17-1971  Age: 47 y.o. MRN: [id number]  The patient is here for follow up and management of other chronic and acute problems.  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 SEES Dr. Cleda Mccreedy at [facility]    The risk factors are reflected in the social history.  The roster of all physicians providing medical care to patient - is listed in the Snapshot section of the chart.  Activities of daily living:  The patient is not 100% independent in all ADLs and lives in a group home. Sh eis accompanied by her adoptive mother and her group home caregiver.  Home safety : The patient has smoke detectors in the facility.   They wear seatbelts.  There are no firearms at home. There is no violence in the home.   There is no risks for hepatitis, STDs or HIV. There is no   history of blood transfusion. They have no travel history to infectious disease endemic areas of the world.  The patient has seen their dentist in the last six month. They have seen their eye doctor in the last year.   They have deferred audiologic testing in the last year.  They do not  have excessive sun exposure. Discussed the need for sun protection: hats, long sleeves and use of sunscreen if there is significant sun exposure.   Diet: the importance of a healthy diet is discussed. They do have a healthy diet.  The benefits of regular aerobic exercise were discussed. She is walking  regularly  Since her successful right  knee replacement and is anticipating having her left one done in the near future.   Depression screen: there are no signs or vegative symptoms of depression- irritability, change in appetite, anhedonia, sadness/tearfullness.  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  She does not manage her financial and personal affairs and is actively engaged.    The following  portions of the patient's history were reviewed and updated as appropriate: allergies, current medications, past family history, past medical history,  past surgical history, past social history  and problem list.  Visual acuity was not assessed per patient preference since she has regular follow up with her ophthalmologist. Hearing and body mass index were assessed and reviewed.   During the course of the visit the patient was educated and counseled about appropriate screening and preventive services including : fall prevention , diabetes screening, nutrition counseling, colorectal cancer screening, and recommended immunizations.    CC: Diagnoses of B12 deficiency, Osteoporosis, unspecified osteoporosis type, unspecified pathological fracture presence, Chronic inflammatory arthritis, and Macrocytosis without anemia were pertinent to this visit.  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   She has a past surgical history that includes Foot surgery (Bilateral); Knee Arthroplasty (Left, 01/10/2017); Hallux valgus correction; Abdominal hysterectomy w/ partial vaginactomy (1992); Colonoscopy (04/16/2013); Colonoscopy (09/24/2003); and Bunionectomy (Bilateral).   Her family history includes Alcohol abuse in her maternal grandfather and maternal grandmother; Arthritis in her maternal grandfather and maternal grandmother; Diabetes in her father, maternal grandfather,  maternal grandmother, and mother;  Heart disease in her father and mother; Stroke in her maternal grandfather and maternal grandmother.She reports that  has never smoked. she has never used smokeless tobacco. She reports that she does not drink alcohol or use drugs.  Outpatient Medications Prior to Visit  Medication Sig Dispense Refill  . acetaminophen (TYLENOL) 325 MG tablet Take 2 tablets (650 mg total) by mouth 4 (four) times daily. 30 tablet 5  . alendronate (FOSAMAX) 70 MG tablet Take 1 tablet (70 mg total) by mouth once a week. Take with a full glass of water on an empty stomach. On Wednesday 4 tablet 3  . benzonatate (TESSALON) 100 MG capsule Take 100 mg by mouth 3 (three) times daily as needed for cough.    . bisacodyl (DULCOLAX) 5 MG EC tablet Take 1 tablet (5 mg total) by mouth at bedtime. 90 tablet 3  . budesonide-formoterol (SYMBICORT) 160-4.5 MCG/ACT inhaler Inhale 2 puffs into the lungs 2 (two) times daily. 1 Inhaler 3  . calcium-vitamin D (OSCAL WITH D) 500-200 MG-UNIT tablet Take 1 tablet by mouth daily with breakfast. 30 tablet 5  . carbamide peroxide (DEBROX) 6.5 % OTIC solution Place 5 drops into both ears daily as needed.    . cholecalciferol (VITAMIN D) 1000 units tablet Take 1 tablet (1,000 Units total) by mouth daily. 30 tablet 5  . docusate sodium (COLACE) 100 MG capsule Take 1 capsule (100 mg total) by mouth every other day. 15 capsule 5  . folic acid (FOLVITE) 1 MG tablet TAKE ONE TABLET BY MOUTH ONCE A DAY. (FOLIC ACID SUPPLEMENT) 30 tablet 10  . gentamicin ointment (GARAMYCIN) 0.1 % Apply 1 application topically 3 (three) times daily as needed (for rash/boils).    Marland Kitchen guaifenesin (HUMIBID E) 400 MG TABS tablet Take 400 mg by mouth every 4 (four) hours as needed (for congestion).    Marland Kitchen guaiFENesin (ROBITUSSIN) 100 MG/5ML liquid Take 100 mg by mouth every 4 (four) hours as needed (for cough or to loosen phlegm).    Marland Kitchen ketoconazole (NIZORAL) 2 % cream Apply 1 application topically daily. Apply to feet or affected  area    . ketotifen (ZADITOR) 0.025 % ophthalmic solution Place 1 drop into both eyes 2 (two) times daily as needed (for allergiesgu).     Marland Kitchen lactulose (CHRONULAC) 10 GM/15ML solution Take 45 g by mouth daily as needed for mild constipation.     Marland Kitchen levalbuterol (XOPENEX) 0.63 MG/3ML nebulizer solution Take 3 mLs (0.63 mg total) by nebulization 3 (three) times daily as needed for wheezing or shortness of breath. 3 mL 6  . levothyroxine (SYNTHROID, LEVOTHROID) 125 MCG tablet Take 1 tablet (125 mcg total) by mouth daily before breakfast. 30 tablet 10  . methotrexate (RHEUMATREX) 2.5 MG tablet TAKE 7 TABLETS (17.5MG) BY MOUTH EVERY WEEK ON FRIDAYS *NOTE DOSAGE* 28 tablet 0  . montelukast (SINGULAIR) 10 MG tablet Take 1 tablet (10 mg total) by mouth at bedtime. 30 tablet 3  . Multiple Vitamins-Minerals (ONE-A-DAY VITACRAVES ADULT) CHEW Chew 2 tablets by mouth daily.    . Multiple Vitamins-Minerals (THEREMS-M) TABS TAKE ONE TABLET BY MOUTH EVERY DAY 35 tablet 5  . omeprazole (PRILOSEC) 20 MG capsule Take 1 capsule (20 mg total) by mouth 2 (two) times daily. 60 capsule 5  . ondansetron (ZOFRAN) 4 MG tablet Take 1 tablet (4 mg total) by mouth every 8 (eight) hours as needed for nausea or vomiting. 20 tablet 5  . predniSONE (DELTASONE) 2.5 MG tablet  Take 1 tablet (2.5 mg total) by mouth daily with breakfast. 90 tablet 2  . senna (SENEXON) 8.6 MG tablet Take 1 tablet (8.6 mg total) by mouth at bedtime. 30 tablet 3  . traMADol (ULTRAM) 50 MG tablet Take 50 mg by mouth every 4 (four) hours as needed for moderate pain or severe pain. Ok to give in addition to scheduled doses for severe pain     No facility-administered medications prior to visit.     Review of Systems   Patient denies headache, fevers, malaise, unintentional weight loss, skin rash, eye pain, sinus congestion and sinus pain, sore throat, dysphagia,  hemoptysis , cough, dyspnea, wheezing, chest pain, palpitations, orthopnea, edema, abdominal  pain, nausea, melena, diarrhea, constipation, flank pain, dysuria, hematuria, urinary  Frequency, nocturia, numbness, tingling, seizures,  Focal weakness, Loss of consciousness,  Tremor, insomnia, depression, anxiety, and suicidal ideation.      Objective:  BP 122/74 (BP Location: Left Arm, Patient Position: Sitting, Cuff Size: Normal)   Pulse 75   Resp 15   Ht 4' 9"  (1.448 m)   Wt 171 lb 6.4 oz (77.7 kg)   LMP 04/11/1981   SpO2 93%   BMI 37.09 kg/m   Physical Exam   General appearance: alert, cooperative and appears stated age Head: Normocephalic, without obvious abnormality, atraumatic Eyes: conjunctivae/corneas clear. PERRL, EOM's intact. Fundi benign. Ears: normal TM's and external ear canals both ears Nose: Nares normal. Septum midline. Mucosa normal. No drainage or sinus tenderness. Throat: lips, mucosa, and tongue normal; teeth and gums normal Neck: no adenopathy, no carotid bruit, no JVD, supple, symmetrical, trachea midline and thyroid not enlarged, symmetric, no tenderness/mass/nodules Lungs: clear to auscultation bilaterally Breasts: normal appearance, no masses or tenderness Heart: regular rate and rhythm, S1, S2 normal, no murmur, click, rub or gallop Abdomen: soft, non-tender; bowel sounds normal; no masses,  no organomegaly Extremities: extremities normal, atraumatic, no cyanosis or edema Pulses: 2+ and symmetric Skin: Skin color, texture, turgor normal. No rashes or lesions Neurologic: Alert and oriented X 3, normal strength and tone. Normal symmetric reflexes. Normal coordination and gait.     Assessment & Plan:   Problem List Items Addressed This Visit    B12 deficiency    Weekly IM x 3,  Then monthly.  First dose today,  The rest to be done at facility       Relevant Medications   cyanocobalamin ((VITAMIN B-12)) injection 1,000 mcg (Completed)   Chronic inflammatory arthritis    Treated for rheumatoid arthritis and ulcerative colitis  by [facility] with MTX, low dose prednisone and folic acid       Macrocytosis without anemia    Secondary to low B12 .  Weekly injections ordered followed by monthly after 3,  To be done at facility  Lab Results  Component Value Date   VITAMINB12 260 06/06/2017         Osteoporosis    Managed with monthly boniva          I have discontinued Anysia L. Sloane's traMADol. I am also having her start on cyanocobalamin and SYRINGE 3CC/25GX1". Additionally, I am having her maintain her lactulose, levalbuterol, ketotifen, gentamicin ointment, guaifenesin, guaiFENesin, ONE-A-DAY VITACRAVES ADULT, carbamide peroxide, ketoconazole, benzonatate, montelukast, omeprazole, cholecalciferol, budesonide-formoterol, senna, calcium-vitamin D, levothyroxine, folic acid, docusate sodium, bisacodyl, alendronate, predniSONE, ondansetron, acetaminophen, THEREMS-M, and methotrexate. We administered cyanocobalamin.  Meds ordered this encounter  Medications  . cyanocobalamin (,VITAMIN B-12,) 1000 MCG/ML injection    Sig: Inject 1  mL (1,000 mcg total) into the muscle once a week. For 2 doses starting Jan 30; then continue same dose once monthly    Dispense:  10 mL    Refill:  0  . Syringe/Needle, Disp, (SYRINGE 3CC/25GX1") 25G X 1" 3 ML MISC    Sig: Use for b12 injections    Dispense:  50 each    Refill:  0  . cyanocobalamin ((VITAMIN B-12)) injection 1,000 mcg    Medications Discontinued During This Encounter  Medication Reason  . traMADol (ULTRAM) 50 MG tablet Completed Course   A total of 40 minutes was spent with patient more than half of which was spent in counseling patient on the above mentioned issues , reviewing and explaining recent labs and imaging studies done, and coordination of care.  Follow-up: Return in about 6 months (around 12/20/2017).   Crecencio Mc, MD

## 2017-06-22 NOTE — Assessment & Plan Note (Signed)
Weekly IM x 3,  Then monthly.  First dose today,  The rest to be done at facility

## 2017-06-25 NOTE — Assessment & Plan Note (Signed)
Secondary to low B12 .  Weekly injections ordered followed by monthly after 3,  To be done at facility  Lab Results  Component Value Date   VITAMINB12 260 06/06/2017

## 2017-06-25 NOTE — Assessment & Plan Note (Signed)
Managed with monthly boniva

## 2017-06-25 NOTE — Assessment & Plan Note (Addendum)
Treated for rheumatoid arthritis and ulcerative colitis  by [facility] with MTX, low dose prednisone and folic acid

## 2017-06-30 ENCOUNTER — Other Ambulatory Visit: Payer: Self-pay | Admitting: Internal Medicine

## 2017-07-04 NOTE — Telephone Encounter (Signed)
Refilled: 05/20/2017 Last OV: 06/22/2017 Next OV: 12/20/2017

## 2017-07-07 DIAGNOSIS — M1711 Unilateral primary osteoarthritis, right knee: Secondary | ICD-10-CM | POA: Diagnosis not present

## 2017-07-13 ENCOUNTER — Telehealth: Payer: Self-pay | Admitting: Internal Medicine

## 2017-07-13 MED ORDER — LEVALBUTEROL HCL 0.63 MG/3ML IN NEBU: 0.6300 mg | INHALATION_SOLUTION | Freq: Three times a day (TID) | RESPIRATORY_TRACT | 6 refills | Status: DC | PRN

## 2017-07-13 NOTE — Telephone Encounter (Signed)
Refilled

## 2017-07-13 NOTE — Telephone Encounter (Signed)
Pt requested refill on levalbuterol. According to the chart, it looks like it expired on 03/03/17. LOV   06/22/17 NOV  12/20/17  Please advise.

## 2017-07-13 NOTE — Telephone Encounter (Signed)
Please advise 

## 2017-07-13 NOTE — Telephone Encounter (Signed)
Is this okay to refill? 

## 2017-07-13 NOTE — Telephone Encounter (Signed)
Copied from [place] [id number]. Topic: Quick Communication - Rx Refill/Question >> Jul 13, 2017 10:21 AM Antonieta Iba C wrote:   Medication: levalbuterol Penne Lash) 0.63 MG/3ML nebulizer solution   Has the patient contacted their pharmacy?yes    (Agent: If no, request that the patient contact the pharmacy for the refill.)   Preferred Pharmacy (with phone number or street name): [facility], [place]: Please be advised that RX refills may take up to 3 business days. We ask that you follow-up with your pharmacy.   CB: [phone]

## 2017-07-28 ENCOUNTER — Other Ambulatory Visit: Payer: Self-pay

## 2017-07-28 ENCOUNTER — Encounter
Admission: RE | Admit: 2017-07-28 | Discharge: 2017-07-28 | Disposition: A | Discharge status: Home / Self Care | Payer: Medicare Other | Source: Ambulatory Visit | Attending: Orthopedic Surgery | Admitting: Orthopedic Surgery

## 2017-07-28 DIAGNOSIS — Z01818 Encounter for other preprocedural examination: Secondary | ICD-10-CM | POA: Insufficient documentation

## 2017-07-28 DIAGNOSIS — J45909 Unspecified asthma, uncomplicated: Secondary | ICD-10-CM | POA: Insufficient documentation

## 2017-07-28 DIAGNOSIS — Z01810 Encounter for preprocedural cardiovascular examination: Secondary | ICD-10-CM | POA: Diagnosis not present

## 2017-07-28 DIAGNOSIS — R9431 Abnormal electrocardiogram [ECG] [EKG]: Secondary | ICD-10-CM | POA: Diagnosis not present

## 2017-07-28 DIAGNOSIS — Z0183 Encounter for blood typing: Secondary | ICD-10-CM | POA: Diagnosis not present

## 2017-07-28 DIAGNOSIS — Z01812 Encounter for preprocedural laboratory examination: Secondary | ICD-10-CM | POA: Diagnosis not present

## 2017-07-28 LAB — COMPREHENSIVE METABOLIC PANEL
ALBUMIN: 3.7 g/dL (ref 3.5–5.0)
ALT: 14 U/L (ref 14–54)
ANION GAP: 8 (ref 5–15)
AST: 29 U/L (ref 15–41)
Alkaline Phosphatase: 65 U/L (ref 38–126)
BUN: 11 mg/dL (ref 6–20)
CHLORIDE: 105 mmol/L (ref 101–111)
CO2: 29 mmol/L (ref 22–32)
Calcium: 8.7 mg/dL — ABNORMAL LOW (ref 8.9–10.3)
Creatinine, Ser: 0.82 mg/dL (ref 0.44–1.00)
GFR calc Af Amer: >60 mL/min (ref >60)
GFR calc non Af Amer: >60 mL/min (ref >60)
GLUCOSE: 93 mg/dL (ref 65–99)
POTASSIUM: 3.7 mmol/L (ref 3.5–5.1)
SODIUM: 142 mmol/L (ref 135–145)
Total Bilirubin: 0.6 mg/dL (ref 0.3–1.2)
Total Protein: 7.1 g/dL (ref 6.5–8.1)

## 2017-07-28 LAB — CBC
HCT: 38.2 % (ref 35.0–47.0)
Hemoglobin: 12.8 g/dL (ref 12.0–16.0)
MCH: 34.4 pg — ABNORMAL HIGH (ref 26.0–34.0)
MCHC: 33.6 g/dL (ref 32.0–36.0)
MCV: 102.5 fL — ABNORMAL HIGH (ref 80.0–100.0)
PLATELETS: 347 10*3/uL (ref 150–440)
RBC: 3.72 MIL/uL — ABNORMAL LOW (ref 3.80–5.20)
RDW: 20.4 % — AB (ref 11.5–14.5)
WBC: 5.5 10*3/uL (ref 3.6–11.0)

## 2017-07-28 LAB — C-REACTIVE PROTEIN: CRP: <0.8 mg/dL (ref <1.0)

## 2017-07-28 LAB — URINALYSIS, ROUTINE W REFLEX MICROSCOPIC
Bilirubin Urine: NEGATIVE
Glucose, UA: NEGATIVE mg/dL
HGB URINE DIPSTICK: NEGATIVE
Ketones, ur: NEGATIVE mg/dL
LEUKOCYTES UA: NEGATIVE
NITRITE: NEGATIVE
PROTEIN: NEGATIVE mg/dL
Specific Gravity, Urine: 1.016 (ref 1.005–1.030)
pH: 6 (ref 5.0–8.0)

## 2017-07-28 LAB — APTT: APTT: 34 s (ref 24–36)

## 2017-07-28 LAB — SEDIMENTATION RATE: SED RATE: 37 mm/h — AB (ref 0–20)

## 2017-07-28 LAB — PROTIME-INR
INR: 0.95
PROTHROMBIN TIME: 12.6 s (ref 11.4–15.2)

## 2017-07-28 LAB — TYPE AND SCREEN
ABO/RH(D): A POS
ANTIBODY SCREEN: NEGATIVE

## 2017-07-28 LAB — SURGICAL PCR SCREEN
MRSA, PCR: NEGATIVE
STAPHYLOCOCCUS AUREUS: NEGATIVE

## 2017-07-28 NOTE — Patient Instructions (Addendum)
  Your procedure is scheduled on: Wednesday August 10, 2017 Report to Same Day Surgery 2nd floor medical mall ([facility] Entrance-take elevator on left to 2nd floor.  Check in with surgery information desk.) To find out your arrival time please call [phone] between 1PM - 3PM on Tuesday August 09, 2017   Remember: Instructions that are not followed completely may result in serious medical risk, up to and including death, or upon the discretion of your surgeon and anesthesiologist your surgery may need to be rescheduled.    _x___ 1. Do not eat food after midnight the night before your procedure. You may drink clear liquids up to 2 hours before you are scheduled to arrive at the hospital for your procedure.  Do not drink clear liquids within 2 hours of your scheduled arrival to the hospital.  Clear liquids include  --Water or Apple juice without pulp  --Clear carbohydrate beverage such as Gatorade  --Black Coffee or Clear Tea (No milk, no creamers, do not add anything to the coffee or Tea Type 1 and type 2 diabetics should only drink water.  No gum chewing or hard candies.     __x__ 2. No Alcohol for 24 hours before or after surgery.   __x__3. No Smoking or e-cigarettes for 24 prior to surgery.  Do not use any chewable tobacco products for at least 6 hour prior to surgery   ____  4. Bring all medications with you on the day of surgery if instructed.    __x__ 5. Notify your doctor if there is any change in your medical condition     (cold, fever, infections).   __x__6. On the morning of surgery brush your teeth with toothpaste and water.  You may rinse your mouth with mouth wash if you wish.  Do not swallow any toothpaste or mouthwash.   Do not wear jewelry, make-up, hairpins, clips or nail polish.  Do not wear lotions, powders, or perfumes. You may wear deodorant.  Do not shave 48 hours prior to surgery. Men may shave face and neck.  Do not bring valuables to the hospital.    [facility] is not responsible for any belongings or valuables.               Contacts, dentures or bridgework may not be worn into surgery.  Leave your suitcase in the car. After surgery it may be brought to your room.  For patients admitted to the hospital, discharge time is determined by your treatment team.    Please read over the following fact sheets that you were given:   [facility] Preparing for Surgery and or MRSA Information   _x___ Take anti-hypertensive listed below, cardiac, seizure, asthma, anti-reflux and psychiatric medicines. These include:  1. Take all medications as normal.  2. Make sure she takes Prilosec and Prednisone before coming to the hospital.   _x___ Use CHG Soap or sage wipes as directed on instruction sheet   _x___ Use inhalers on the day of surgery and bring to hospital day of surgery  _x___ Follow recommendations from Cardiologist, Pulmonologist or PCP regarding stopping Aspirin, Coumadin, Plavix ,Eliquis, Effient, or Pradaxa, and Pletal.  _x___Stop Anti-inflammatories such as Advil, Aleve, Ibuprofen, Motrin, Naproxen, Naprosyn, Goodies powders or aspirin products. OK to take Tylenol and Celebrex.   _x___ Stop supplements until after surgery.  But may continue Vitamin D, Vitamin B, and multivitamin.

## 2017-07-28 NOTE — Pre-Procedure Instructions (Signed)
Faxed Dr. Clydell Hakim office w/ lab results: CMP, CBC, ESR, UA.

## 2017-07-30 LAB — URINE CULTURE: SPECIAL REQUESTS: NORMAL

## 2017-08-01 NOTE — Pre-Procedure Instructions (Signed)
[facility] FAXED TO DR Marry Guan OFFICE

## 2017-08-09 ENCOUNTER — Encounter: Payer: Self-pay | Admitting: Orthopedic Surgery

## 2017-08-09 MED ORDER — CEFAZOLIN SODIUM-DEXTROSE 2-4 GM/100ML-% IV SOLN: 2.0000 g | INTRAVENOUS | Status: DC

## 2017-08-09 MED ORDER — TRANEXAMIC ACID 1000 MG/10ML IV SOLN
1000.0000 mg | INTRAVENOUS | Status: DC
  Filled 2017-08-09: qty 10

## 2017-08-09 NOTE — Discharge Instructions (Signed)
°  Instructions after Total Knee Replacement ° ° Reesa Gotschall P. Karolynn Infantino, Jr., M.D.    ° Dept. of Orthopaedics & Sports Medicine ° [facility] ° [address] ° [place], [place]  [address] ° Phone: [phone]   Fax: [phone] ° °  °DIET: °• Drink plenty of non-alcoholic fluids. °• Resume your normal diet. Include foods high in fiber. ° °ACTIVITY:  °• You may use crutches or a walker with weight-bearing as tolerated, unless instructed otherwise. °• You may be weaned off of the walker or crutches by your Physical Therapist.  °• Do NOT place pillows under the knee. Anything placed under the knee could limit your ability to straighten the knee.   °• Continue doing gentle exercises. Exercising will reduce the pain and swelling, increase motion, and prevent muscle weakness.   °• Please continue to use the TED compression stockings for 6 weeks. You may remove the stockings at night, but should reapply them in the morning. °• Do not drive or operate any equipment until instructed. ° °WOUND CARE:  °• Continue to use the PolarCare or ice packs periodically to reduce pain and swelling. °• You may bathe or shower after the staples are removed at the first office visit following surgery. ° °MEDICATIONS: °• You may resume your regular medications. °• Please take the pain medication as prescribed on the medication. °• Do not take pain medication on an empty stomach. °• You have been given a prescription for a blood thinner (Lovenox or Coumadin). Please take the medication as instructed. (NOTE: After completing a 2 week course of Lovenox, take one Enteric-coated aspirin once a day. This along with elevation will help reduce the possibility of phlebitis in your operated leg.) °• Do not drive or drink alcoholic beverages when taking pain medications. ° °CALL THE OFFICE FOR: °• Temperature above 101 degrees °• Excessive bleeding or drainage on the dressing. °• Excessive swelling, coldness, or paleness of the toes. °• Persistent  nausea and vomiting. ° °FOLLOW-UP:  °• You should have an appointment to return to the office in 10-14 days after surgery. °• Arrangements have been made for continuation of Physical Therapy (either home therapy or outpatient therapy). °  °

## 2017-08-10 ENCOUNTER — Inpatient Hospital Stay: Payer: Medicare Other | Admitting: Certified Registered"

## 2017-08-10 ENCOUNTER — Encounter
Admission: RE | Disposition: A | Discharge status: Skilled Nursing Facility | Payer: Self-pay | Source: Ambulatory Visit | Attending: Orthopedic Surgery

## 2017-08-10 ENCOUNTER — Inpatient Hospital Stay: Payer: Medicare Other

## 2017-08-10 ENCOUNTER — Inpatient Hospital Stay
Admission: RE | Admit: 2017-08-10 | Discharge: 2017-08-13 | DRG: 470 | Disposition: A | Discharge status: Skilled Nursing Facility | Payer: Medicare Other | Source: Ambulatory Visit | Attending: Orthopedic Surgery | Admitting: Orthopedic Surgery

## 2017-08-10 ENCOUNTER — Other Ambulatory Visit: Payer: Self-pay

## 2017-08-10 DIAGNOSIS — M6281 Muscle weakness (generalized): Secondary | ICD-10-CM | POA: Diagnosis not present

## 2017-08-10 DIAGNOSIS — R4182 Altered mental status, unspecified: Secondary | ICD-10-CM | POA: Diagnosis not present

## 2017-08-10 DIAGNOSIS — M1711 Unilateral primary osteoarthritis, right knee: Secondary | ICD-10-CM | POA: Diagnosis present

## 2017-08-10 DIAGNOSIS — E538 Deficiency of other specified B group vitamins: Secondary | ICD-10-CM | POA: Diagnosis not present

## 2017-08-10 DIAGNOSIS — M069 Rheumatoid arthritis, unspecified: Secondary | ICD-10-CM | POA: Diagnosis present

## 2017-08-10 DIAGNOSIS — Z96652 Presence of left artificial knee joint: Secondary | ICD-10-CM | POA: Diagnosis present

## 2017-08-10 DIAGNOSIS — E039 Hypothyroidism, unspecified: Secondary | ICD-10-CM | POA: Diagnosis present

## 2017-08-10 DIAGNOSIS — Z79890 Hormone replacement therapy (postmenopausal): Secondary | ICD-10-CM | POA: Diagnosis not present

## 2017-08-10 DIAGNOSIS — Z96651 Presence of right artificial knee joint: Secondary | ICD-10-CM

## 2017-08-10 DIAGNOSIS — J449 Chronic obstructive pulmonary disease, unspecified: Secondary | ICD-10-CM | POA: Diagnosis present

## 2017-08-10 DIAGNOSIS — Z86718 Personal history of other venous thrombosis and embolism: Secondary | ICD-10-CM | POA: Diagnosis not present

## 2017-08-10 DIAGNOSIS — Q909 Down syndrome, unspecified: Secondary | ICD-10-CM | POA: Diagnosis not present

## 2017-08-10 DIAGNOSIS — H9192 Unspecified hearing loss, left ear: Secondary | ICD-10-CM | POA: Diagnosis present

## 2017-08-10 DIAGNOSIS — I071 Rheumatic tricuspid insufficiency: Secondary | ICD-10-CM | POA: Diagnosis not present

## 2017-08-10 DIAGNOSIS — Z96659 Presence of unspecified artificial knee joint: Secondary | ICD-10-CM

## 2017-08-10 DIAGNOSIS — J4540 Moderate persistent asthma, uncomplicated: Secondary | ICD-10-CM | POA: Diagnosis not present

## 2017-08-10 DIAGNOSIS — Z743 Need for continuous supervision: Secondary | ICD-10-CM | POA: Diagnosis not present

## 2017-08-10 DIAGNOSIS — R569 Unspecified convulsions: Secondary | ICD-10-CM | POA: Diagnosis not present

## 2017-08-10 DIAGNOSIS — Z6836 Body mass index (BMI) 36.0-36.9, adult: Secondary | ICD-10-CM | POA: Diagnosis not present

## 2017-08-10 DIAGNOSIS — Z90710 Acquired absence of both cervix and uterus: Secondary | ICD-10-CM

## 2017-08-10 DIAGNOSIS — R262 Difficulty in walking, not elsewhere classified: Secondary | ICD-10-CM | POA: Diagnosis not present

## 2017-08-10 DIAGNOSIS — Z471 Aftercare following joint replacement surgery: Secondary | ICD-10-CM | POA: Diagnosis not present

## 2017-08-10 DIAGNOSIS — K219 Gastro-esophageal reflux disease without esophagitis: Secondary | ICD-10-CM | POA: Diagnosis present

## 2017-08-10 DIAGNOSIS — Z7952 Long term (current) use of systemic steroids: Secondary | ICD-10-CM | POA: Diagnosis not present

## 2017-08-10 DIAGNOSIS — Z79899 Other long term (current) drug therapy: Secondary | ICD-10-CM | POA: Diagnosis not present

## 2017-08-10 DIAGNOSIS — Z7951 Long term (current) use of inhaled steroids: Secondary | ICD-10-CM | POA: Diagnosis not present

## 2017-08-10 DIAGNOSIS — M810 Age-related osteoporosis without current pathological fracture: Secondary | ICD-10-CM | POA: Diagnosis present

## 2017-08-10 DIAGNOSIS — K5190 Ulcerative colitis, unspecified, without complications: Secondary | ICD-10-CM | POA: Diagnosis not present

## 2017-08-10 DIAGNOSIS — E669 Obesity, unspecified: Secondary | ICD-10-CM | POA: Diagnosis not present

## 2017-08-10 HISTORY — PX: KNEE ARTHROPLASTY: SHX992

## 2017-08-10 SURGERY — ARTHROPLASTY, KNEE, TOTAL, USING IMAGELESS COMPUTER-ASSISTED NAVIGATION
Anesthesia: Spinal | Site: Knee | Laterality: Right | Wound class: Clean

## 2017-08-10 MED ORDER — TETRACAINE HCL 1 % IJ SOLN
INTRAMUSCULAR | Status: DC | PRN
  Administered 2017-08-10: 5 mg via INTRASPINAL

## 2017-08-10 MED ORDER — SODIUM CHLORIDE 0.9 % IV SOLN
INTRAVENOUS | Status: DC
  Administered 2017-08-10: 17:00:00 via INTRAVENOUS
  Administered 2017-08-11: 100 mL/h via INTRAVENOUS

## 2017-08-10 MED ORDER — HYDROMORPHONE HCL 1 MG/ML IJ SOLN: 0.5000 mg | INTRAMUSCULAR | Status: DC | PRN

## 2017-08-10 MED ORDER — FLEET ENEMA 7-19 GM/118ML RE ENEM: 1.0000 | ENEMA | Freq: Once | RECTAL | Status: DC | PRN

## 2017-08-10 MED ORDER — ALBUTEROL SULFATE (2.5 MG/3ML) 0.083% IN NEBU: 2.5000 mg | INHALATION_SOLUTION | Freq: Four times a day (QID) | RESPIRATORY_TRACT | Status: DC | PRN

## 2017-08-10 MED ORDER — TRANEXAMIC ACID 1000 MG/10ML IV SOLN
INTRAVENOUS | Status: DC | PRN
  Administered 2017-08-10: 1000 mg via INTRAVENOUS

## 2017-08-10 MED ORDER — GABAPENTIN 300 MG PO CAPS
300.0000 mg | ORAL_CAPSULE | Freq: Once | ORAL | Status: AC
  Administered 2017-08-10: 300 mg via ORAL

## 2017-08-10 MED ORDER — DIPHENHYDRAMINE HCL 12.5 MG/5ML PO ELIX: 12.5000 mg | ORAL_SOLUTION | ORAL | Status: DC | PRN

## 2017-08-10 MED ORDER — BUPIVACAINE HCL (PF) 0.5 % IJ SOLN
INTRAMUSCULAR | Status: DC | PRN
  Administered 2017-08-10: 2 mL

## 2017-08-10 MED ORDER — DEXAMETHASONE SODIUM PHOSPHATE 10 MG/ML IJ SOLN
INTRAMUSCULAR | Status: AC
  Administered 2017-08-10: 8 mg via INTRAVENOUS
  Filled 2017-08-10: qty 1

## 2017-08-10 MED ORDER — MOMETASONE FURO-FORMOTEROL FUM 200-5 MCG/ACT IN AERO
2.0000 | INHALATION_SPRAY | Freq: Two times a day (BID) | RESPIRATORY_TRACT | Status: DC
  Administered 2017-08-10 – 2017-08-13 (×6): 2 via RESPIRATORY_TRACT
  Filled 2017-08-10: qty 8.8

## 2017-08-10 MED ORDER — ONDANSETRON HCL 4 MG/2ML IJ SOLN: 4.0000 mg | Freq: Four times a day (QID) | INTRAMUSCULAR | Status: DC | PRN

## 2017-08-10 MED ORDER — PROMETHAZINE HCL 25 MG/ML IJ SOLN: 6.2500 mg | INTRAMUSCULAR | Status: DC | PRN

## 2017-08-10 MED ORDER — ENOXAPARIN SODIUM 30 MG/0.3ML ~~LOC~~ SOLN
30.0000 mg | Freq: Two times a day (BID) | SUBCUTANEOUS | Status: DC
  Administered 2017-08-11 – 2017-08-13 (×4): 30 mg via SUBCUTANEOUS
  Filled 2017-08-10 (×5): qty 0.3

## 2017-08-10 MED ORDER — VITAMIN D 1000 UNITS PO TABS
1000.0000 [IU] | ORAL_TABLET | Freq: Every day | ORAL | Status: DC
  Administered 2017-08-10 – 2017-08-13 (×4): 1000 [IU] via ORAL
  Filled 2017-08-10 (×3): qty 1

## 2017-08-10 MED ORDER — KETOTIFEN FUMARATE 0.025 % OP SOLN
1.0000 [drp] | Freq: Every day | OPHTHALMIC | Status: DC | PRN
  Filled 2017-08-10: qty 5

## 2017-08-10 MED ORDER — TRAMADOL HCL 50 MG PO TABS
50.0000 mg | ORAL_TABLET | ORAL | Status: DC | PRN
  Administered 2017-08-10: 100 mg via ORAL
  Administered 2017-08-11 (×4): 50 mg via ORAL
  Administered 2017-08-12: 100 mg via ORAL
  Administered 2017-08-12 – 2017-08-13 (×7): 50 mg via ORAL
  Filled 2017-08-10 (×7): qty 1
  Filled 2017-08-10: qty 2
  Filled 2017-08-10 (×2): qty 1
  Filled 2017-08-10: qty 2
  Filled 2017-08-10 (×2): qty 1

## 2017-08-10 MED ORDER — GABAPENTIN 300 MG PO CAPS
300.0000 mg | ORAL_CAPSULE | Freq: Every day | ORAL | Status: DC
  Administered 2017-08-10 – 2017-08-12 (×3): 300 mg via ORAL
  Filled 2017-08-10 (×3): qty 1

## 2017-08-10 MED ORDER — KETAMINE HCL 50 MG/ML IJ SOLN
INTRAMUSCULAR | Status: DC | PRN
  Administered 2017-08-10: 50 mg via INTRAMUSCULAR

## 2017-08-10 MED ORDER — CEFAZOLIN SODIUM-DEXTROSE 2-4 GM/100ML-% IV SOLN
2.0000 g | Freq: Four times a day (QID) | INTRAVENOUS | Status: AC
  Administered 2017-08-10 – 2017-08-11 (×4): 2 g via INTRAVENOUS
  Filled 2017-08-10 (×4): qty 100

## 2017-08-10 MED ORDER — CHLORHEXIDINE GLUCONATE 4 % EX LIQD: 60.0000 mL | Freq: Once | CUTANEOUS | Status: DC

## 2017-08-10 MED ORDER — METOCLOPRAMIDE HCL 10 MG PO TABS
10.0000 mg | ORAL_TABLET | Freq: Three times a day (TID) | ORAL | Status: AC
  Administered 2017-08-10 – 2017-08-12 (×7): 10 mg via ORAL
  Filled 2017-08-10 (×6): qty 1

## 2017-08-10 MED ORDER — PROPOFOL 500 MG/50ML IV EMUL
INTRAVENOUS | Status: AC
  Filled 2017-08-10: qty 50

## 2017-08-10 MED ORDER — KETAMINE HCL 50 MG/ML IJ SOLN
INTRAMUSCULAR | Status: AC
  Filled 2017-08-10: qty 10

## 2017-08-10 MED ORDER — BISACODYL 5 MG PO TBEC
5.0000 mg | DELAYED_RELEASE_TABLET | Freq: Every day | ORAL | Status: DC
  Administered 2017-08-10 – 2017-08-11 (×2): 5 mg via ORAL
  Filled 2017-08-10 (×2): qty 1

## 2017-08-10 MED ORDER — FENTANYL CITRATE (PF) 100 MCG/2ML IJ SOLN
INTRAMUSCULAR | Status: AC
  Administered 2017-08-10: 25 ug via INTRAVENOUS
  Filled 2017-08-10: qty 2

## 2017-08-10 MED ORDER — FENTANYL CITRATE (PF) 100 MCG/2ML IJ SOLN
INTRAMUSCULAR | Status: AC
  Filled 2017-08-10: qty 2

## 2017-08-10 MED ORDER — CELECOXIB 200 MG PO CAPS
ORAL_CAPSULE | ORAL | Status: AC
  Administered 2017-08-10: 400 mg
  Filled 2017-08-10: qty 2

## 2017-08-10 MED ORDER — ACETAMINOPHEN 10 MG/ML IV SOLN
INTRAVENOUS | Status: DC | PRN
  Administered 2017-08-10: 1000 mg via INTRAVENOUS

## 2017-08-10 MED ORDER — KETOCONAZOLE 2 % EX CREA
1.0000 "application " | TOPICAL_CREAM | Freq: Every day | CUTANEOUS | Status: DC
  Administered 2017-08-10 – 2017-08-13 (×2): 1 via TOPICAL
  Filled 2017-08-10: qty 15

## 2017-08-10 MED ORDER — PROPOFOL 10 MG/ML IV BOLUS
INTRAVENOUS | Status: AC
  Filled 2017-08-10: qty 20

## 2017-08-10 MED ORDER — MONTELUKAST SODIUM 10 MG PO TABS
10.0000 mg | ORAL_TABLET | Freq: Every day | ORAL | Status: DC
  Administered 2017-08-10 – 2017-08-12 (×3): 10 mg via ORAL
  Filled 2017-08-10 (×3): qty 1

## 2017-08-10 MED ORDER — METOCLOPRAMIDE HCL 10 MG PO TABS: 5.0000 mg | ORAL_TABLET | Freq: Three times a day (TID) | ORAL | Status: DC | PRN

## 2017-08-10 MED ORDER — ACETAMINOPHEN 10 MG/ML IV SOLN
INTRAVENOUS | Status: AC
  Filled 2017-08-10: qty 100

## 2017-08-10 MED ORDER — BISACODYL 10 MG RE SUPP: 10.0000 mg | Freq: Every day | RECTAL | Status: DC | PRN

## 2017-08-10 MED ORDER — LACTATED RINGERS IV SOLN
INTRAVENOUS | Status: DC
  Administered 2017-08-10 (×2): via INTRAVENOUS

## 2017-08-10 MED ORDER — CEFAZOLIN SODIUM-DEXTROSE 2-3 GM-%(50ML) IV SOLR
INTRAVENOUS | Status: DC | PRN
  Administered 2017-08-10: 2 g via INTRAVENOUS

## 2017-08-10 MED ORDER — MIDAZOLAM HCL 5 MG/5ML IJ SOLN
INTRAMUSCULAR | Status: DC | PRN
  Administered 2017-08-10: 2 mg via INTRAVENOUS

## 2017-08-10 MED ORDER — SENNOSIDES-DOCUSATE SODIUM 8.6-50 MG PO TABS
1.0000 | ORAL_TABLET | Freq: Two times a day (BID) | ORAL | Status: DC
  Administered 2017-08-10 – 2017-08-13 (×5): 1 via ORAL
  Filled 2017-08-10 (×5): qty 1

## 2017-08-10 MED ORDER — LEVOTHYROXINE SODIUM 25 MCG PO TABS
125.0000 ug | ORAL_TABLET | Freq: Every day | ORAL | Status: DC
  Administered 2017-08-11 – 2017-08-12 (×2): 125 ug via ORAL
  Filled 2017-08-10 (×2): qty 1

## 2017-08-10 MED ORDER — BUPIVACAINE HCL (PF) 0.5 % IJ SOLN
INTRAMUSCULAR | Status: AC
  Filled 2017-08-10: qty 10

## 2017-08-10 MED ORDER — METOCLOPRAMIDE HCL 5 MG/ML IJ SOLN: 5.0000 mg | Freq: Three times a day (TID) | INTRAMUSCULAR | Status: DC | PRN

## 2017-08-10 MED ORDER — BUPIVACAINE HCL (PF) 0.25 % IJ SOLN
INTRAMUSCULAR | Status: DC | PRN
  Administered 2017-08-10: 60 mL

## 2017-08-10 MED ORDER — PROPOFOL 10 MG/ML IV BOLUS
INTRAVENOUS | Status: DC | PRN
  Administered 2017-08-10: 7.5 mg via INTRAVENOUS

## 2017-08-10 MED ORDER — MIDAZOLAM HCL 2 MG/2ML IJ SOLN
INTRAMUSCULAR | Status: AC
  Filled 2017-08-10: qty 2

## 2017-08-10 MED ORDER — FENTANYL CITRATE (PF) 100 MCG/2ML IJ SOLN
25.0000 ug | INTRAMUSCULAR | Status: DC | PRN
  Administered 2017-08-10 (×2): 25 ug via INTRAVENOUS

## 2017-08-10 MED ORDER — FERROUS SULFATE 325 (65 FE) MG PO TABS
325.0000 mg | ORAL_TABLET | Freq: Two times a day (BID) | ORAL | Status: DC
  Administered 2017-08-10 – 2017-08-13 (×5): 325 mg via ORAL
  Filled 2017-08-10 (×5): qty 1

## 2017-08-10 MED ORDER — PROPOFOL 500 MG/50ML IV EMUL
INTRAVENOUS | Status: DC | PRN
  Administered 2017-08-10: 60 ug/kg/min via INTRAVENOUS

## 2017-08-10 MED ORDER — ADULT MULTIVITAMIN W/MINERALS CH
1.0000 | ORAL_TABLET | Freq: Every day | ORAL | Status: DC
  Administered 2017-08-11 – 2017-08-13 (×3): 1 via ORAL
  Filled 2017-08-10 (×3): qty 1

## 2017-08-10 MED ORDER — OLOPATADINE HCL 0.1 % OP SOLN
1.0000 [drp] | Freq: Every day | OPHTHALMIC | Status: DC
  Administered 2017-08-10 – 2017-08-13 (×4): 1 [drp] via OPHTHALMIC
  Filled 2017-08-10: qty 5

## 2017-08-10 MED ORDER — GABAPENTIN 300 MG PO CAPS
ORAL_CAPSULE | ORAL | Status: AC
  Administered 2017-08-10: 300 mg via ORAL
  Filled 2017-08-10: qty 1

## 2017-08-10 MED ORDER — PHENOL 1.4 % MT LIQD
1.0000 | OROMUCOSAL | Status: DC | PRN
  Filled 2017-08-10: qty 177

## 2017-08-10 MED ORDER — MENTHOL 3 MG MT LOZG
1.0000 | LOZENGE | OROMUCOSAL | Status: DC | PRN
  Filled 2017-08-10: qty 9

## 2017-08-10 MED ORDER — ACETAMINOPHEN 10 MG/ML IV SOLN
1000.0000 mg | Freq: Four times a day (QID) | INTRAVENOUS | Status: AC
  Administered 2017-08-10 – 2017-08-11 (×3): 1000 mg via INTRAVENOUS
  Filled 2017-08-10 (×4): qty 100

## 2017-08-10 MED ORDER — CELECOXIB 200 MG PO CAPS
200.0000 mg | ORAL_CAPSULE | Freq: Two times a day (BID) | ORAL | Status: DC
  Administered 2017-08-10 – 2017-08-13 (×6): 200 mg via ORAL
  Filled 2017-08-10 (×6): qty 1

## 2017-08-10 MED ORDER — FOLIC ACID 1 MG PO TABS
1.0000 mg | ORAL_TABLET | Freq: Every day | ORAL | Status: DC
  Administered 2017-08-10 – 2017-08-13 (×4): 1 mg via ORAL
  Filled 2017-08-10 (×3): qty 1

## 2017-08-10 MED ORDER — CEFAZOLIN SODIUM-DEXTROSE 2-4 GM/100ML-% IV SOLN
INTRAVENOUS | Status: AC
  Administered 2017-08-10: 2000 mg
  Filled 2017-08-10: qty 100

## 2017-08-10 MED ORDER — DEXAMETHASONE SODIUM PHOSPHATE 10 MG/ML IJ SOLN
8.0000 mg | Freq: Once | INTRAMUSCULAR | Status: AC
  Administered 2017-08-10: 8 mg via INTRAVENOUS

## 2017-08-10 MED ORDER — MAGNESIUM HYDROXIDE 400 MG/5ML PO SUSP: 30.0000 mL | Freq: Every day | ORAL | Status: DC | PRN

## 2017-08-10 MED ORDER — LIDOCAINE HCL (PF) 2 % IJ SOLN
INTRAMUSCULAR | Status: AC
  Filled 2017-08-10: qty 10

## 2017-08-10 MED ORDER — ACETAMINOPHEN 325 MG PO TABS: 325.0000 mg | ORAL_TABLET | Freq: Four times a day (QID) | ORAL | Status: DC | PRN

## 2017-08-10 MED ORDER — FENTANYL CITRATE (PF) 100 MCG/2ML IJ SOLN
INTRAMUSCULAR | Status: DC | PRN
  Administered 2017-08-10: 12.5 ug via INTRAVENOUS
  Administered 2017-08-10: 37.5 ug via INTRAVENOUS

## 2017-08-10 MED ORDER — ALUM & MAG HYDROXIDE-SIMETH 200-200-20 MG/5ML PO SUSP: 30.0000 mL | ORAL | Status: DC | PRN

## 2017-08-10 MED ORDER — CYANOCOBALAMIN 1000 MCG/ML IJ SOLN
1000.0000 ug | INTRAMUSCULAR | Status: DC
  Administered 2017-08-11: 1000 ug via INTRAMUSCULAR
  Filled 2017-08-10: qty 1

## 2017-08-10 MED ORDER — SODIUM CHLORIDE 0.9 % IV SOLN
INTRAVENOUS | Status: DC | PRN
  Administered 2017-08-10: 20 ug/min via INTRAVENOUS

## 2017-08-10 MED ORDER — TRANEXAMIC ACID 1000 MG/10ML IV SOLN
1000.0000 mg | Freq: Once | INTRAVENOUS | Status: AC
  Administered 2017-08-10: 1000 mg via INTRAVENOUS
  Filled 2017-08-10: qty 10

## 2017-08-10 MED ORDER — ONDANSETRON HCL 4 MG PO TABS: 4.0000 mg | ORAL_TABLET | Freq: Four times a day (QID) | ORAL | Status: DC | PRN

## 2017-08-10 MED ORDER — CELECOXIB 200 MG PO CAPS
400.0000 mg | ORAL_CAPSULE | Freq: Once | ORAL | Status: AC
  Administered 2017-08-10: 400 mg via ORAL
  Filled 2017-08-10: qty 2

## 2017-08-10 MED ORDER — NEOMYCIN-POLYMYXIN B GU 40-200000 IR SOLN
Status: DC | PRN
  Administered 2017-08-10: 14 mL

## 2017-08-10 MED ORDER — PANTOPRAZOLE SODIUM 40 MG PO TBEC
40.0000 mg | DELAYED_RELEASE_TABLET | Freq: Two times a day (BID) | ORAL | Status: DC
  Administered 2017-08-10 – 2017-08-13 (×6): 40 mg via ORAL
  Filled 2017-08-10 (×6): qty 1

## 2017-08-10 MED ORDER — CALCIUM CARBONATE-VITAMIN D 500-200 MG-UNIT PO TABS
1.0000 | ORAL_TABLET | Freq: Every day | ORAL | Status: DC
  Administered 2017-08-11 – 2017-08-13 (×3): 1 via ORAL
  Filled 2017-08-10 (×3): qty 1

## 2017-08-10 MED ORDER — SODIUM CHLORIDE 0.9 % IV SOLN
INTRAVENOUS | Status: DC | PRN
  Administered 2017-08-10: 60 mL

## 2017-08-10 MED ORDER — PREDNISONE 2.5 MG PO TABS
2.5000 mg | ORAL_TABLET | Freq: Every day | ORAL | Status: DC
  Administered 2017-08-11 – 2017-08-13 (×3): 2.5 mg via ORAL
  Filled 2017-08-10 (×3): qty 1

## 2017-08-10 SURGICAL SUPPLY — 67 items
BATTERY INSTRU NAVIGATION (MISCELLANEOUS) ×12 IMPLANT
BLADE SAW 1 (BLADE) ×3 IMPLANT
BLADE SAW 1/2 (BLADE) ×3 IMPLANT
BLADE SAW 70X12.5 (BLADE) IMPLANT
BONE CEMENT GENTAMICIN (Cement) ×3 IMPLANT
CANISTER SUCT 1200ML W/VALVE (MISCELLANEOUS) ×3 IMPLANT
CANISTER SUCT 3000ML PPV (MISCELLANEOUS) ×6 IMPLANT
CAP KNEE TOTAL 3 SIGMA ×3 IMPLANT
CEMENT BONE GENTAMICIN 40 (Cement) ×1 IMPLANT
COOLER POLAR GLACIER W/PUMP (MISCELLANEOUS) ×3 IMPLANT
CUFF TOURN 24 STER (MISCELLANEOUS) IMPLANT
CUFF TOURN 30 STER DUAL PORT (MISCELLANEOUS) ×3 IMPLANT
DRAPE SHEET LG 3/4 BI-LAMINATE (DRAPES) ×3 IMPLANT
DRSG DERMACEA 8X12 NADH (GAUZE/BANDAGES/DRESSINGS) ×3 IMPLANT
DRSG OPSITE POSTOP 4X14 (GAUZE/BANDAGES/DRESSINGS) ×3 IMPLANT
DRSG TEGADERM 4X4.75 (GAUZE/BANDAGES/DRESSINGS) ×3 IMPLANT
DURAPREP 26ML APPLICATOR (WOUND CARE) ×6 IMPLANT
ELECT CAUTERY BLADE 6.4 (BLADE) ×3 IMPLANT
ELECT REM PT RETURN 9FT ADLT (ELECTROSURGICAL) ×3
ELECTRODE REM PT RTRN 9FT ADLT (ELECTROSURGICAL) ×1 IMPLANT
EVACUATOR 1/8 PVC DRAIN (DRAIN) ×3 IMPLANT
EX-PIN ORTHOLOCK NAV 4X150 (PIN) ×6 IMPLANT
GLOVE BIOGEL M STRL SZ7.5 (GLOVE) ×6 IMPLANT
GLOVE BIOGEL PI IND STRL 7.0 (GLOVE) ×3 IMPLANT
GLOVE BIOGEL PI IND STRL 9 (GLOVE) ×1 IMPLANT
GLOVE BIOGEL PI INDICATOR 7.0 (GLOVE) ×6
GLOVE BIOGEL PI INDICATOR 9 (GLOVE) ×2
GLOVE INDICATOR 8.0 STRL GRN (GLOVE) ×3 IMPLANT
GLOVE SURG SYN 9.0  PF PI (GLOVE) ×2
GLOVE SURG SYN 9.0 PF PI (GLOVE) ×1 IMPLANT
GOWN STRL REUS W/ TWL LRG LVL3 (GOWN DISPOSABLE) ×2 IMPLANT
GOWN STRL REUS W/TWL 2XL LVL3 (GOWN DISPOSABLE) ×3 IMPLANT
GOWN STRL REUS W/TWL LRG LVL3 (GOWN DISPOSABLE) ×4
HOLDER FOLEY CATH W/STRAP (MISCELLANEOUS) ×3 IMPLANT
HOOD PEEL AWAY FLYTE STAYCOOL (MISCELLANEOUS) ×6 IMPLANT
KIT TURNOVER KIT A (KITS) ×3 IMPLANT
KNIFE SCULPS 14X20 (INSTRUMENTS) ×3 IMPLANT
LABEL OR SOLS (LABEL) ×3 IMPLANT
NDL SAFETY ECLIPSE 18X1.5 (NEEDLE) ×1 IMPLANT
NEEDLE HYPO 18GX1.5 SHARP (NEEDLE) ×2
NEEDLE SPNL 20GX3.5 QUINCKE YW (NEEDLE) ×6 IMPLANT
NS IRRIG 500ML POUR BTL (IV SOLUTION) ×3 IMPLANT
PACK TOTAL KNEE (MISCELLANEOUS) ×3 IMPLANT
PAD WRAPON POLAR KNEE (MISCELLANEOUS) ×1 IMPLANT
PIN DRILL QUICK PACK ×3 IMPLANT
PIN FIXATION 1/8DIA X 3INL (PIN) ×3 IMPLANT
PULSAVAC PLUS IRRIG FAN TIP (DISPOSABLE) ×3
SOL .9 NS 3000ML IRR  AL (IV SOLUTION) ×2
SOL .9 NS 3000ML IRR UROMATIC (IV SOLUTION) ×1 IMPLANT
SOL PREP PVP 2OZ (MISCELLANEOUS) ×3
SOLUTION PREP PVP 2OZ (MISCELLANEOUS) ×1 IMPLANT
SPONGE DRAIN TRACH 4X4 STRL 2S (GAUZE/BANDAGES/DRESSINGS) ×3 IMPLANT
STAPLER SKIN PROX 35W (STAPLE) ×3 IMPLANT
STOCKINETTE IMPERV 14X48 (MISCELLANEOUS) ×3 IMPLANT
STRAP TIBIA SHORT (MISCELLANEOUS) ×3 IMPLANT
SUCTION FRAZIER HANDLE 10FR (MISCELLANEOUS) ×2
SUCTION TUBE FRAZIER 10FR DISP (MISCELLANEOUS) ×1 IMPLANT
SUT VIC AB 0 CT1 36 (SUTURE) ×3 IMPLANT
SUT VIC AB 1 CT1 36 (SUTURE) ×6 IMPLANT
SUT VIC AB 2-0 CT2 27 (SUTURE) ×3 IMPLANT
SYR 20CC LL (SYRINGE) ×3 IMPLANT
SYR 30ML LL (SYRINGE) ×6 IMPLANT
TIP FAN IRRIG PULSAVAC PLUS (DISPOSABLE) ×1 IMPLANT
TOWEL OR 17X26 4PK STRL BLUE (TOWEL DISPOSABLE) ×3 IMPLANT
TOWER CARTRIDGE SMART MIX (DISPOSABLE) ×3 IMPLANT
TRAY FOLEY W/METER SILVER 16FR (SET/KITS/TRAYS/PACK) ×3 IMPLANT
WRAPON POLAR PAD KNEE (MISCELLANEOUS) ×3

## 2017-08-10 NOTE — Clinical Social Work Note (Addendum)
Clinical Social Work Assessment  Patient Details  Name: Cindy Oconnor MRN: [id number] Date of Birth: [birth year]  Date of referral:  08/10/17               Reason for consult:  Facility Placement                Permission sought to share information with:  Chartered certified accountant granted to share information::  Yes, Verbal Permission Granted  Name::      [place]::   [place]   Relationship::     Contact Information:     Housing/Transportation Living arrangements for the past 2 months:  [place] of Information:  Facility Patient Interpreter Needed:  None Criminal Activity/Legal Involvement Pertinent to Current Situation/Hospitalization:  No - Comment as needed Significant Relationships:  Parents Lives with:  Facility Resident Do you feel safe going back to the place where you live?  Yes Need for family participation in patient care:  Yes (Comment)  Care giving concerns: Patient is a resident at [facility] group home and had a planned knee surgery with Dr. Marry Guan today.   Social Worker assessment / plan:  Holiday representative (CSW) received SNF consult. PT is pending. Patient had surgery today. CSW is familiar with patient and her family from previous admissions. CSW received a call from [facility] at [facility] stating that patient's mother [name] has reached out to her about patient coming to rehab there. Patient went to [facility] for rehab in August 2018. Per Sharyn Lull she will accept patient for rehab however it may be a semi-private room. CSW left patient's mother [name] a Advertising account executive. CSW was able to get in contact with patient's father Koleen Nimrod. Per father patient is still a resident at [facility] group home and [name] is her legal guardian. Father reported that he would talk to [place] about sharing a room at [facility]. CSW explained that medicare requires a 3 night inpatient qualifying stay in a  hospital in order to pay for SNF. Patient was admitted to inpatient on 08/10/17. Father verbalized his understanding. FL2 complete and faxed out. PASARR is pending. CSW will continue to follow and assist as needed.   CSW met with patient's mother [name] in the hall way outside of patient's room. [name] reported that she will accept a semi-private room at [facility] if a private is not available. Sharyn Lull at [place] is aware of above.   Employment status:  Disabled (Comment on whether or not currently receiving Disability) Insurance information:  Medicare, Medicaid In [place] PT Recommendations:  Not assessed at this time Information / Referral to community resources:  [place]  Patient/Family's Response to care:  SNF search started.   Patient/Family's Understanding of and Emotional Response to Diagnosis, Current Treatment, and Prognosis:  Patient's father was very pleasant and thanked CSW for assistance.   Emotional Assessment Appearance:  Appears stated age Attitude/Demeanor/Rapport:    Affect (typically observed):  Pleasant Orientation:  Oriented to Self, Oriented to Place, Oriented to  Time, Fluctuating Orientation (Suspected and/or reported Sundowners) Alcohol / Substance use:  Not Applicable Psych involvement (Current and /or in the community):  No (Comment)  Discharge Needs  Concerns to be addressed:  Discharge Planning Concerns Readmission within the last 30 days:  No Current discharge risk:  Dependent with Mobility Barriers to Discharge:  Continued Medical Work up   [employer], Veronia Beets, LCSW 08/10/2017, 3:44 PM

## 2017-08-10 NOTE — Op Note (Signed)
OPERATIVE NOTE  DATE OF SURGERY:  08/10/2017  PATIENT NAME:  Cindy Oconnor   DOB: 22-Oct-1970  MRN: [id number]  PRE-OPERATIVE DIAGNOSIS: Degenerative arthrosis of the right knee, primary  POST-OPERATIVE DIAGNOSIS:  Same  PROCEDURE:  Right total knee arthroplasty using computer-assisted navigation  SURGEON:  Marciano Sequin. M.D.  ASSISTANT:  Vance Peper, PA (present and scrubbed throughout the case, critical for assistance with exposure, retraction, instrumentation, and closure)  ANESTHESIA: spinal  ESTIMATED BLOOD LOSS: 50 mL  FLUIDS REPLACED: 1600 mL of crystalloid  TOURNIQUET TIME: 97 minutes  DRAINS: 2 medium Hemovac drains  SOFT TISSUE RELEASES: Anterior cruciate ligament, posterior cruciate ligament, deep medial collateral ligament, patellofemoral ligament  IMPLANTS UTILIZED: DePuy PFC Sigma size 2 posterior stabilized femoral component (cemented), size 1.5 MBT tibial component (cemented), 32 mm 3 peg oval dome patella (cemented), and a 20 mm stabilized rotating platform polyethylene insert.  INDICATIONS FOR SURGERY: Cindy Oconnor is a 47 y.o. year old female with a long history of progressive knee pain. X-rays demonstrated severe degenerative changes in tricompartmental fashion. The patient had not seen any significant improvement despite conservative nonsurgical intervention. After discussion of the risks and benefits of surgical intervention, the patient expressed understanding of the risks benefits and agree with plans for total knee arthroplasty.   The risks, benefits, and alternatives were discussed at length including but not limited to the risks of infection, bleeding, nerve injury, stiffness, blood clots, the need for revision surgery, cardiopulmonary complications, among others, and they were willing to proceed.  PROCEDURE IN DETAIL: The patient was brought into the operating room and, after adequate spinal anesthesia was achieved, a tourniquet was placed on  the patient's upper thigh. The patient's knee and leg were cleaned and prepped with alcohol and DuraPrep and draped in the usual sterile fashion. A "timeout" was performed as per usual protocol. The lower extremity was exsanguinated using an Esmarch, and the tourniquet was inflated to 300 mmHg. An anterior longitudinal incision was made followed by a standard mid vastus approach. The deep fibers of the medial collateral ligament were elevated in a subperiosteal fashion off of the medial flare of the tibia so as to maintain a continuous soft tissue sleeve. The patella was subluxed laterally and the patellofemoral ligament was incised. Inspection of the knee demonstrated severe degenerative changes with full-thickness loss of articular cartilage. Osteophytes were debrided using a rongeur. Anterior and posterior cruciate ligaments were excised. Two 4.0 mm Schanz pins were inserted in the femur and into the tibia for attachment of the array of trackers used for computer-assisted navigation. Hip center was identified using a circumduction technique. Distal landmarks were mapped using the computer. The distal femur and proximal tibia were mapped using the computer. The distal femoral cutting guide was positioned using computer-assisted navigation so as to achieve a 5 distal valgus cut. The femur was sized and it was felt that a size 2 femoral component was appropriate. A size 2 femoral cutting guide was positioned and the anterior cut was performed and verified using the computer. This was followed by completion of the posterior and chamfer cuts. Femoral cutting guide for the central box was then positioned in the center box cut was performed.  Attention was then directed to the proximal tibia. Medial and lateral menisci were excised. The extramedullary tibial cutting guide was positioned using computer-assisted navigation so as to achieve a 0 varus-valgus alignment and 0 posterior slope. The cut was performed and  verified using the computer. The  proximal tibia was sized and it was felt that a size 1.5 tibial tray was appropriate. Tibial and femoral trials were inserted followed by insertion of a 20 mm polyethylene insert. This allowed for excellent mediolateral soft tissue balancing both in flexion and in full extension. Finally, the patella was cut and prepared so as to accommodate a 32 mm 3 peg oval dome patella. A patella trial was placed and the knee was placed through a range of motion with excellent patellar tracking appreciated. The femoral trial was removed after debridement of posterior osteophytes. The central post-hole for the tibial component was reamed followed by insertion of a keel punch. Tibial trials were then removed. Cut surfaces of bone were irrigated with copious amounts of normal saline with antibiotic solution using pulsatile lavage and then suctioned dry. Polymethylmethacrylate cement with gentamicin was prepared in the usual fashion using a vacuum mixer. Cement was applied to the cut surface of the proximal tibia as well as along the undersurface of a size 1.5 MBT tibial component. Tibial component was positioned and impacted into place. Excess cement was removed using Civil Service fast streamer. Cement was then applied to the cut surfaces of the femur as well as along the posterior flanges of the size 2 femoral component. The femoral component was positioned and impacted into place. Excess cement was removed using [employer] fast streamer. A 20 mm polyethylene trial was inserted and the knee was brought into full extension with steady axial compression applied. Finally, cement was applied to the backside of a 32 mm 3 peg oval dome patella and the patellar component was positioned and patellar clamp applied. Excess cement was removed using [employer] fast streamer. After adequate curing of the cement, the tourniquet was deflated after a total tourniquet time of 97 minutes. Hemostasis was achieved using electrocautery. The knee  was irrigated with copious amounts of normal saline with antibiotic solution using pulsatile lavage and then suctioned dry. 20 mL of 1.3% Exparel and 60 mL of 0.25% Marcaine in 40 mL of normal saline was injected along the posterior capsule, medial and lateral gutters, and along the arthrotomy site. A 20 mm stabilized rotating platform polyethylene insert was inserted and the knee was placed through a range of motion with excellent mediolateral soft tissue balancing appreciated and excellent patellar tracking noted. 2 medium drains were placed in the wound bed and brought out through separate stab incisions. The medial parapatellar portion of the incision was reapproximated using interrupted sutures of #1 Vicryl. Subcutaneous tissue was approximated in layers using first #0 Vicryl followed #2-0 Vicryl. The skin was approximated with skin staples. A sterile dressing was applied.  The patient tolerated the procedure well and was transported to the recovery room in stable condition.    Lillia Lengel P. Holley Bouche., M.D.

## 2017-08-10 NOTE — Anesthesia Post-op Follow-up Note (Signed)
Anesthesia QCDR form completed.        

## 2017-08-10 NOTE — Clinical Social Work Placement (Signed)
   CLINICAL SOCIAL WORK PLACEMENT  NOTE  Date:  08/10/2017  Patient Details  Name: Cindy Oconnor MRN: [id number] Date of Birth: June 26, 1970  Clinical Social Work is seeking post-discharge placement for this patient at the [place] level of care (*CSW will initial, date and re-position this form in  chart as items are completed):  Yes   Patient/family provided with [place] Work Department's list of facilities offering this level of care within the geographic area requested by the patient (or if unable, by the patient's family).  Yes   Patient/family informed of their freedom to choose among providers that offer the needed level of care, that participate in Medicare, Medicaid or managed care program needed by the patient, have an available bed and are willing to accept the patient.  Yes   Patient/family informed of [place]'s ownership interest in [facility] and [facility], as well as of the fact that they are under no obligation to receive care at these facilities.  PASRR submitted to EDS on 08/10/17     PASRR number received on       Existing PASRR number confirmed on       FL2 transmitted to all facilities in geographic area requested by pt/family on 08/10/17     FL2 transmitted to all facilities within larger geographic area on       Patient informed that his/her managed care company has contracts with or will negotiate with certain facilities, including the following:            Patient/family informed of bed offers received.  Patient chooses bed at       Physician recommends and patient chooses bed at      Patient to be transferred to   on  .  Patient to be transferred to facility by       Patient family notified on   of transfer.  Name of family member notified:        PHYSICIAN       Additional Comment:    _______________________________________________ Ethelmae Ringel, Veronia Beets, LCSW 08/10/2017, 3:43 PM

## 2017-08-10 NOTE — Anesthesia Preprocedure Evaluation (Signed)
Anesthesia Evaluation  Patient identified by MRN, date of birth, ID band Patient awake    Reviewed: Allergy & Precautions, NPO status , Patient's Chart, lab work & pertinent test results  History of Anesthesia Complications Negative for: history of anesthetic complications  Airway Mallampati: IV  TM Distance: <3 FB     Dental  (+) Chipped, Poor Dentition   Pulmonary asthma ,           Cardiovascular negative cardio ROS       Neuro/Psych negative neurological ROS  negative psych ROS   GI/Hepatic Neg liver ROS, PUD, GERD  Medicated,  Endo/Other  neg diabetesHypothyroidism   Renal/GU negative Renal ROS  negative genitourinary   Musculoskeletal  (+) Arthritis , Osteoarthritis and Rheumatoid disorders,    Abdominal Normal abdominal exam  (+)   Peds negative pediatric ROS (+)  Hematology negative hematology ROS (+)   Anesthesia Other Findings Past Medical History: No date: Arthritis No date: Asthma No date: Blood clot in vein No date: Chicken pox 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 No date: Down syndrome No date: GERD (gastroesophageal reflux disease) No date: Hypothyroidism 1995: Lupus No date: RA (rheumatoid arthritis) (HCC) No date: Ulcerative colitis (HCC)  Reproductive/Obstetrics negative OB ROS                             Anesthesia Physical  Anesthesia Plan  ASA: III  Anesthesia Plan: Spinal   Post-op Pain Management:    Induction: Intravenous  PONV Risk Score and Plan: 2 and Propofol infusion  Airway Management Planned: Simple Face Mask  Additional Equipment:   Intra-op Plan:   Post-operative Plan:   Informed Consent: I have reviewed the patients History and Physical, chart, labs and discussed the procedure including the risks, benefits and alternatives for the proposed anesthesia with the patient or authorized representative who has indicated his/her understanding  and acceptance.   Dental advisory given  Plan Discussed with: Surgeon and CRNA  Anesthesia Plan Comments:         Anesthesia Quick Evaluation

## 2017-08-10 NOTE — Anesthesia Procedure Notes (Signed)
Spinal  Patient location during procedure: OR Start time: 08/10/2017 11:38 AM End time: 08/10/2017 11:50 AM Staffing Anesthesiologist: Martha Clan, MD Resident/CRNA: Bernardo Heater, CRNA Performed: resident/CRNA  Preanesthetic Checklist Completed: patient identified, site marked, surgical consent, pre-op evaluation, timeout performed, IV checked, risks and benefits discussed and monitors and equipment checked Spinal Block Patient position: sitting Prep: ChloraPrep Patient monitoring: heart rate, continuous pulse ox, blood pressure and cardiac monitor Approach: midline Location: L3-4 Injection technique: single-shot Needle Needle type: Whitacre and Introducer  Needle gauge: 24 G Needle length: 9 cm Assessment Sensory level: T10 Additional Notes Negative paresthesia. Negative blood return. Positive free-flowing CSF. Expiration date of kit checked and confirmed. Patient tolerated procedure well, without complications.

## 2017-08-10 NOTE — Progress Notes (Signed)
Pt tolerating PO's well however, BP was 88/49 (dinamap) and 91/59 when rechecked manually. Fluids infusing well. Pt tried using bone foam for a brief period of time then eventually refused it. Pt was sobbing and stated "I don't want to raise my knee for so long. It really hurts". Pt was offered pain medicine this time but sated she "wants to sleep". RN will reinforce and encourage pt to use bone foam once pt is more comfortable. Will continue to monitor and attend needs.

## 2017-08-10 NOTE — Progress Notes (Signed)
Patient was admitted to room 148 from OR. A&O x 4. Drain, foley, foot pumps, and TED in place. Patient has down syndrome. Family at bedside. Bed alarm on for safety. Reviewed POC and orders.  IV fluids started.

## 2017-08-10 NOTE — H&P (Signed)
The patient has been re-examined, and the chart reviewed, and there have been no interval changes to the documented history and physical.    The risks, benefits, and alternatives have been discussed at length. The patient expressed understanding of the risks benefits and agreed with plans for surgical intervention.  James P. Hooten, Jr. M.D.    

## 2017-08-10 NOTE — Transfer of Care (Signed)
Immediate Anesthesia Transfer of Care Note  Patient: Cindy Oconnor  Procedure(s) Performed: COMPUTER ASSISTED TOTAL KNEE ARTHROPLASTY (Right Knee)  Patient Location: PACU  Anesthesia Type:Spinal  Level of Consciousness: awake and patient cooperative  Airway & Oxygen Therapy: Patient Spontanous Breathing and Patient connected to nasal cannula oxygen  Post-op Assessment: Report given to RN and Post -op Vital signs reviewed and stable  Post vital signs: Reviewed and stable  Last Vitals:  Vitals:   08/10/17 1002 08/10/17 1510  BP: (!) 120/54 100/82  Pulse: 67 91  Resp: 15 18  Temp: (!) 36.4 C (!) 36.4 C  SpO2: 100% 97%    Last Pain:  Vitals:   08/10/17 1002  TempSrc: Temporal         Complications: No apparent anesthesia complications

## 2017-08-10 NOTE — NC FL2 (Signed)
[place] LEVEL OF CARE SCREENING TOOL     IDENTIFICATION  Patient Name: Cindy Oconnor Birthdate: 09-20-1970 Sex: female Admission Date (Current Location): 08/10/2017  [facility] and [place] Number:  Selena Lesser ([id number] [facility]) Facility and Address:  [facility], [address], [place], [place] [address]      Provider Number: [id number]  Attending Physician Name and Address:  Dereck Leep, MD  Relative Name and Phone Number:       Current Level of Care: Hospital Recommended Level of Care: [place] Prior Approval Number:    Date Approved/Denied:   PASRR Number:    Discharge Plan: SNF    Current Diagnoses: Patient Active Problem List   Diagnosis Date Noted  . S/P total knee arthroplasty 08/10/2017  . B12 deficiency 06/22/2017  . Primary osteoarthritis of right knee 06/02/2017  . Acute foot pain, left 03/15/2017  . Hypocalcemia 03/15/2017  . Status post total left knee replacement 01/10/2017  . Macrocytosis without anemia 01/08/2017  . Preoperative clearance 01/08/2017  . Moderate persistent asthma with acute exacerbation 09/14/2016  . Ulcerative colitis, chronic ([place]) 09/26/2015  . Chronic inflammatory arthritis 01/30/2014  . Osteoporosis 01/30/2014  . Mild tricuspid regurgitation 02/13/2013  . Routine general medical examination at a health care facility 01/24/2013  . Obesity (BMI 30-39.9) 01/21/2012  . Rheumatoid arthritis ([place]) 06/11/2006  . S/P TAH (total abdominal hysterectomy)  and partial vaginectomy 06/11/2006  . Hypothyroid 03/18/2006  . Asthma, chronic 03/18/2006  . GERD 03/18/2006  . Down's syndrome 03/18/2006    Orientation RESPIRATION BLADDER Height & Weight     Self, Time, Place  Normal Continent Weight:   Height:     BEHAVIORAL SYMPTOMS/MOOD NEUROLOGICAL BOWEL NUTRITION STATUS      Continent Diet(Diet: NPO to be advanced. )  AMBULATORY STATUS COMMUNICATION OF NEEDS Skin   Extensive  Assist Verbally Surgical wounds(Incision: Right Knee. )                       Personal Care Assistance Level of Assistance  Bathing, Feeding, Dressing Bathing Assistance: Limited assistance Feeding assistance: Independent Dressing Assistance: Limited assistance     Functional Limitations Info  Sight, Hearing, Speech Sight Info: Adequate Hearing Info: Adequate Speech Info: Adequate    SPECIAL CARE FACTORS FREQUENCY  PT (By licensed PT), OT (By licensed OT)     PT Frequency: (5) OT Frequency: (5)            Contractures      Additional Factors Info  Code Status, Allergies Code Status Info: (Full Code. ) Allergies Info: (Erythromycin, Oxycontin Oxycodone, Albuterol)           Current Medications (08/10/2017):  This is the current hospital active medication list Current Facility-Administered Medications  Medication Dose Route Frequency Provider Last Rate Last Dose  . ceFAZolin (ANCEF) 2-4 GM/100ML-% IVPB           . chlorhexidine (HIBICLENS) 4 % liquid 4 application  60 mL Topical Once Hooten, Laurice Record, MD      . fentaNYL (SUBLIMAZE) injection 25-50 mcg  25-50 mcg Intravenous Q5 min PRN Martha Clan, MD      . lactated ringers infusion   Intravenous Continuous Alvin Critchley, MD 0 mL/hr at 08/10/17 1415    . promethazine (PHENERGAN) injection 6.25-12.5 mg  6.25-12.5 mg Intravenous Q15 min PRN Martha Clan, MD      . tranexamic acid (CYKLOKAPRON) 1,000 mg in sodium chloride 0.9 % 100 mL  IVPB  1,000 mg Intravenous Once Dereck Leep, MD 220 mL/hr at 08/10/17 1512 1,000 mg at 08/10/17 1512     Discharge Medications: Please see discharge summary for a list of discharge medications.  Relevant Imaging Results:  Relevant Lab Results:   Additional Information (SSN: [id number])  Willene Holian, Veronia Beets, LCSW

## 2017-08-11 ENCOUNTER — Encounter: Payer: Self-pay | Admitting: Orthopedic Surgery

## 2017-08-11 ENCOUNTER — Encounter
Admission: RE | Admit: 2017-08-11 | Discharge: 2017-08-11 | Disposition: A | Discharge status: Home / Self Care | Payer: Medicare Other | Source: Ambulatory Visit | Attending: Internal Medicine | Admitting: Internal Medicine

## 2017-08-11 MED ORDER — TRAMADOL HCL 50 MG PO TABS: 50.0000 mg | ORAL_TABLET | ORAL | 0 refills | Status: DC | PRN

## 2017-08-11 MED ORDER — SODIUM CHLORIDE 0.9 % IV BOLUS (SEPSIS)
500.0000 mL | Freq: Once | INTRAVENOUS | Status: AC
  Administered 2017-08-11: 500 mL via INTRAVENOUS

## 2017-08-11 MED ORDER — ENOXAPARIN SODIUM 30 MG/0.3ML ~~LOC~~ SOLN: 30.0000 mg | Freq: Two times a day (BID) | SUBCUTANEOUS | 0 refills | Status: DC

## 2017-08-11 NOTE — Anesthesia Postprocedure Evaluation (Signed)
Anesthesia Post Note  Patient: Cindy Oconnor  Procedure(s) Performed: COMPUTER ASSISTED TOTAL KNEE ARTHROPLASTY (Right Knee)  Patient location during evaluation: Nursing Unit Anesthesia Type: Spinal Level of consciousness: awake, awake and alert and oriented Pain management: pain level controlled Vital Signs Assessment: post-procedure vital signs reviewed and stable Respiratory status: spontaneous breathing, nonlabored ventilation and respiratory function stable Cardiovascular status: blood pressure returned to baseline and stable Postop Assessment: no headache and no backache Anesthetic complications: no     Last Vitals:  Vitals:   08/11/17 0011 08/11/17 0426  BP: (!) 89/51 (!) 93/46  Pulse: (!) 59 (!) 55  Resp: 19 19  Temp: 36.6 C (!) 36.3 C  SpO2: 100% 100%    Last Pain:  Vitals:   08/11/17 0611  TempSrc:   PainSc: 3                  [employer]

## 2017-08-11 NOTE — Progress Notes (Signed)
Plan is for patient to D/C to [facility] Saturday 08/13/17 pending medical clearance. PASARR has been received, [id number] A expires on 09/10/2017. [facility] admissions coordinator at [facility] is aware of above.   [employer], LCSW [phone]

## 2017-08-11 NOTE — Progress Notes (Signed)
Post bolus BP 96/38, O2 sats 100% at room air. Pt denies pain at this time. Will continue to monitor.

## 2017-08-11 NOTE — Progress Notes (Signed)
Physical Therapy Treatment Patient Details Name: Cindy Oconnor MRN: [id number] DOB: 12-13-1970 Today's Date: 08/11/2017    History of Present Illness 47 y/o female s/p R TKA 3/13, had L knee replaced in August.  Baseline Down's Syndrome, lives in group home.    PT Comments    Pt is able to perform supine exercises with better tolerance this afternoon, but continues to have inconsistent levels of pain (most notedly with ROM acts).  She did better with mobility and transfers and was able to take a few forward and backward steps, but is still very hesitant with WBing and ultimately is very functionally limited at this time.   Follow Up Recommendations  SNF     Equipment Recommendations       Recommendations for Other Services       Precautions / Restrictions Precautions Precautions: Fall Restrictions RLE Weight Bearing: Weight bearing as tolerated    Mobility  Bed Mobility Overal bed mobility: Needs Assistance Bed Mobility: Supine to Sit;Sit to Supine     Supine to sit: Min guard Sit to supine: Min assist   General bed mobility comments: Pt able to get to sitting EOB w/o assist, did need light assist to get LEs back into bed after mobility/stepping  Transfers Overall transfer level: Needs assistance Equipment used: Rolling walker (2 wheeled) Transfers: Sit to/from Stand Sit to Stand: Min guard         General transfer comment: Pt better able to trust R LE and shift weight forward and get to standing w/o heavy assist  Ambulation/Gait Ambulation/Gait assistance: Mod assist Ambulation Distance (Feet): 9 Feet Assistive device: Rolling walker (2 wheeled)       General Gait Details: Pt better able to put weight on the R but still obvious very hesitant.  She was able to walk fwd/back ~3 ft X 3 with a lot of encouragement and cuing.  Heavy reliance on walker.   Stairs            Wheelchair Mobility    Modified Rankin (Stroke Patients Only)        Balance Overall balance assessment: Needs assistance Sitting-balance support: No upper extremity supported Sitting balance-Leahy Scale: Good     Standing balance support: Bilateral upper extremity supported Standing balance-Leahy Scale: Poor Standing balance comment: Pt fearful and calling out in distracted pain during standing                            Cognition Arousal/Alertness: Awake/alert Behavior During Therapy: Impulsive;Anxious Overall Cognitive Status: History of cognitive impairments - at baseline                                        Exercises Total Joint Exercises Ankle Circles/Pumps: AROM;15 reps Quad Sets: Strengthening;15 reps Gluteal Sets: Strengthening;15 reps Short Arc Quad: 10 reps;AROM Heel Slides: PROM;5 reps Hip ABduction/ADduction: Strengthening;10 reps Straight Leg Raises: AROM;10 reps Knee Flexion: PROM;5 reps Goniometric ROM: flexion >75* this afternoon    General Comments        Pertinent Vitals/Pain Pain Assessment: (appears to be minimal pain at rest, increases with movement)    Home Living Family/patient expects to be discharged to:: Skilled nursing facility               Additional Comments: Pt lives in group home    Prior Function Level of Independence:  Independent with assistive device(s)(Pt has need walker since lead up to L TKA 7 months ago)      Comments: Pt able to be relatively active at group home   PT Goals (current goals can now be found in the care plan section) Acute Rehab PT Goals Patient Stated Goal: Go to rehab and then live with mom during outpt PT PT Goal Formulation: With patient/family Time For Goal Achievement: 08/25/17 Potential to Achieve Goals: Good Progress towards PT goals: Progressing toward goals    Frequency    BID      PT Plan Current plan remains appropriate    Co-evaluation              AM-PAC PT "6 Clicks" Daily Activity  Outcome Measure   Difficulty turning over in bed (including adjusting bedclothes, sheets and blankets)?: A Little Difficulty moving from lying on back to sitting on the side of the bed? : A Little Difficulty sitting down on and standing up from a chair with arms (e.g., wheelchair, bedside commode, etc,.)?: A Little Help needed moving to and from a bed to chair (including a wheelchair)?: A Lot Help needed walking in hospital room?: Total Help needed climbing 3-5 steps with a railing? : Total 6 Click Score: 13    End of Session Equipment Utilized During Treatment: Gait belt Activity Tolerance: Patient limited by pain;Patient tolerated treatment well Patient left: with chair alarm set;with call bell/phone within reach Nurse Communication: Mobility status PT Visit Diagnosis: Muscle weakness (generalized) (M62.81);Difficulty in walking, not elsewhere classified (R26.2)     Time: 9628-3662 PT Time Calculation (min) (ACUTE ONLY): 26 min  Charges:  $Gait Training: 8-22 mins $Therapeutic Exercise: 8-22 mins                    G Codes:       Kreg Shropshire, DPT 08/11/2017, 3:17 PM

## 2017-08-11 NOTE — Progress Notes (Signed)
   Subjective: 1 Day Post-Op Procedure(s) (LRB): COMPUTER ASSISTED TOTAL KNEE ARTHROPLASTY (Right) Patient reports pain as 5 on 0-10 scale.   Patient is well, and has had no acute complaints or problems We will start therapy today.  Plan is to go Rehab after hospital stay. no nausea and no vomiting Patient denies any chest pains or shortness of breath. Rested well during the night No c/o's  Objective: Vital signs in last 24 hours: Temp:  [97.1 F (36.2 C)-97.9 F (36.6 C)] 97.4 F (36.3 C) (03/14 0426) Pulse Rate:  [55-91] 55 (03/14 0426) Resp:  [12-20] 19 (03/14 0426) BP: (77-120)/(36-83) 93/46 (03/14 0426) SpO2:  [96 %-100 %] 100 % (03/14 0426) Weight:  [78.5 kg (173 lb 1 oz)] 78.5 kg (173 lb 1 oz) (03/13 1645) Heels are non tender and elevated off the bed using rolled towels Pt refusing bone foam  Intake/Output from previous day: 03/13 0701 - 03/14 0700 In: 4756.7 [P.O.:840; I.V.:3316.7; IV Piggyback:600] Out: 2970 [Urine:2750; Drains:170; Blood:50] Intake/Output this shift: No intake/output data recorded.  No results for input(s): HGB in the last 72 hours. No results for input(s): WBC, RBC, HCT, PLT in the last 72 hours. No results for input(s): NA, K, CL, CO2, BUN, CREATININE, GLUCOSE, CALCIUM in the last 72 hours. No results for input(s): LABPT, INR in the last 72 hours.  EXAM General - Patient is Alert, Appropriate and Oriented Extremity - Neurologically intact Neurovascular intact Sensation intact distally Intact pulses distally Dorsiflexion/Plantar flexion intact Compartment soft Dressing - dressing C/D/I Motor Function - intact, moving foot and toes well on exam. Difficult to do SLR on own  Past Medical History:  Diagnosis Date  . Arthritis   . Asthma    unspecified  . Blood clot in vein   . Chicken pox   . Colitis   . COPD (chronic obstructive pulmonary disease) ([place])   . Deafness in left ear   . Disease of thyroid gland   . Down syndrome   .  DVT (deep venous thrombosis) ([place])   . Fractures   . GERD (gastroesophageal reflux disease)   . Hypothyroidism    unspecified  . 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 Positive anti-CCP antibodies, negative rheumatoid factor, positive FANA. b. Methotrexate, Prednisone, Plaquenil.   . Joint pain   . Lupus 1995  . Osteoarthritis 01/30/2014   a. Lumbar disc disease. b. Trigger nodules  . Osteoporosis 01/30/2014   a. Boniva  . RA (rheumatoid arthritis) ([place])   . Shingles   . Ulcerative (chronic) enterocolitis ([place])   . Ulcerative colitis ([place])     Assessment/Plan: 1 Day Post-Op Procedure(s) (LRB): COMPUTER ASSISTED TOTAL KNEE ARTHROPLASTY (Right) Active Problems:   S/P total knee arthroplasty  Estimated body mass index is 37.45 kg/m as calculated from the following:   Height as of this encounter: 4' 9"  (1.448 m).   Weight as of this encounter: 78.5 kg (173 lb 1 oz). Advance diet Up with therapy D/C IV fluids Plan for discharge tomorrow Discharge to SNF  Labs: none DVT Prophylaxis - Lovenox, Foot Pumps and TED hose Weight-Bearing as tolerated to right leg D/C O2 and Pulse OX and try on Room Air Begin working on bowel movement  Theo Krumholz R. Morrison [place] 08/11/2017, 7:28 AM

## 2017-08-11 NOTE — Discharge Summary (Addendum)
Physician Discharge Summary  Patient ID: Cindy Oconnor MRN: [id number] DOB/AGE: Sep 01, 1970 47 y.o.  Admit date: 08/10/2017 Discharge date: 08/13/2017  Admission Diagnoses:  primary osteoarthritis of right knee   Discharge Diagnoses: Patient Active Problem List   Diagnosis Date Noted  . S/P total knee arthroplasty 08/10/2017  . B12 deficiency 06/22/2017  . Primary osteoarthritis of right knee 06/02/2017  . Acute foot pain, left 03/15/2017  . Hypocalcemia 03/15/2017  . Status post total left knee replacement 01/10/2017  . Macrocytosis without anemia 01/08/2017  . Preoperative clearance 01/08/2017  . Moderate persistent asthma with acute exacerbation 09/14/2016  . Ulcerative colitis, chronic ([place]) 09/26/2015  . Chronic inflammatory arthritis 01/30/2014  . Osteoporosis 01/30/2014  . Mild tricuspid regurgitation 02/13/2013  . Routine general medical examination at a health care facility 01/24/2013  . Obesity (BMI 30-39.9) 01/21/2012  . Rheumatoid arthritis ([place]) 06/11/2006  . S/P TAH (total abdominal hysterectomy)  and partial vaginectomy 06/11/2006  . Hypothyroid 03/18/2006  . Asthma, chronic 03/18/2006  . GERD 03/18/2006  . Down's syndrome 03/18/2006    Past Medical History:  Diagnosis Date  . Arthritis   . Asthma    unspecified  . Blood clot in vein   . Chicken pox   . Colitis   . COPD (chronic obstructive pulmonary disease) ([place])   . Deafness in left ear   . Disease of thyroid gland   . Down syndrome   . DVT (deep venous thrombosis) ([place])   . Fractures   . GERD (gastroesophageal reflux disease)   . Hypothyroidism    unspecified  . 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 Positive anti-CCP antibodies, negative rheumatoid factor, positive FANA. b. Methotrexate, Prednisone, Plaquenil.   . Joint pain   . Lupus 1995  . Osteoarthritis 01/30/2014   a. Lumbar disc disease. b. Trigger nodules  . Osteoporosis 01/30/2014   a. Boniva  . RA (rheumatoid arthritis)  ([place])   . Shingles   . Ulcerative (chronic) enterocolitis ([place])   . Ulcerative colitis ([place])      Transfusion: none   Consultants (if any):   Discharged Condition: Improved  Hospital Course: Cindy Oconnor is an 47 y.o. female who was admitted 08/10/2017 with a diagnosis of degenerative arthrosis to right knee and went to the operating room on 08/10/2017 and underwent the above named procedures.    Surgeries:Procedure(s): COMPUTER ASSISTED TOTAL KNEE ARTHROPLASTY on 08/10/2017  PRE-OPERATIVE DIAGNOSIS: Degenerative arthrosis of the right knee, primary  POST-OPERATIVE DIAGNOSIS:  Same  PROCEDURE:  Right total knee arthroplasty using computer-assisted navigation  SURGEON:  Marciano Sequin. M.D.  ASSISTANT:  Vance Peper, PA (present and scrubbed throughout the case, critical for assistance with exposure, retraction, instrumentation, and closure)  ANESTHESIA: spinal  ESTIMATED BLOOD LOSS: 50 mL  FLUIDS REPLACED: 1600 mL of crystalloid  TOURNIQUET TIME: 97 minutes  DRAINS: 2 medium Hemovac drains  SOFT TISSUE RELEASES: Anterior cruciate ligament, posterior cruciate ligament, deep medial collateral ligament, patellofemoral ligament  IMPLANTS UTILIZED: DePuy PFC Sigma size 2 posterior stabilized femoral component (cemented), size 1.5 MBT tibial component (cemented), 32 mm 3 peg oval dome patella (cemented), and a 20 mm stabilized rotating platform polyethylene insert.  INDICATIONS FOR SURGERY: [name] is a 47 y.o. year old female with a long history of progressive knee pain. X-rays demonstrated severe degenerative changes in tricompartmental fashion. The patient had not seen any significant improvement despite conservative nonsurgical intervention. After discussion of the risks and benefits of surgical intervention, the patient expressed  understanding of the risks benefits and agree with plans for total knee arthroplasty.   The risks, benefits, and  alternatives were discussed at length including but not limited to the risks of infection, bleeding, nerve injury, stiffness, blood clots, the need for revision surgery, cardiopulmonary complications, among others, and they were willing to proceed.   Patient tolerated the surgery well. No complications .Patient was taken to PACU where she was stabilized and then transferred to the orthopedic floor.  Patient started on Lovenox 30 q 12 hrs. Foot pumps applied bilaterally at 80 mm hgb. Heels elevated off bed with rolled towels. No evidence of DVT. Calves non tender. Negative [place]oman. Physical therapy started on day #1 for gait training and transfer with OT starting on  day #1 for ADL and assisted devices. Patient has done well with therapy. Ambulated greater than 15 feet upon being discharged.  Patient's IV And Foley were discontinued on day #1 with Hemovac being discontinued on day #2. Dressing was changed on day 2 prior to patient being discharged   She was given perioperative antibiotics:  Anti-infectives (From admission, onward)   Start     Dose/Rate Route Frequency Ordered Stop   08/10/17 1800  ceFAZolin (ANCEF) IVPB 2g/100 mL premix     2 g 200 mL/hr over 30 Minutes Intravenous Every 6 hours 08/10/17 1628 08/11/17 1759   08/10/17 0946  ceFAZolin (ANCEF) 2-4 GM/100ML-% IVPB    Comments:  Phineas Real   : cabinet override      08/10/17 0946 08/10/17 1719   08/10/17 0600  ceFAZolin (ANCEF) IVPB 2g/100 mL premix  Status:  Discontinued     2 g 200 mL/hr over 30 Minutes Intravenous On call to O.R. 08/09/17 2225 08/10/17 1030    .  She was fitted with AV 1 compression foot pump devices, instructed on heel pumps, early ambulation, and fitted with TED stockings bilaterally for DVT prophylaxis.  She benefited maximally from the hospital stay and there were no complications.    Recent vital signs:  Vitals:   08/11/17 0011 08/11/17 0426  BP: (!) 89/51 (!) 93/46  Pulse: (!) 59 (!) 55  Resp:  19 19  Temp: 97.9 F (36.6 C) (!) 97.4 F (36.3 C)  SpO2: 100% 100%    Recent laboratory studies:  Lab Results  Component Value Date   HGB 12.8 07/28/2017   HGB 13.8 06/06/2017   HGB 10.6 (L) 01/12/2017   Lab Results  Component Value Date   WBC 5.5 07/28/2017   PLT 347 07/28/2017   Lab Results  Component Value Date   INR 0.95 07/28/2017   Lab Results  Component Value Date   NA 142 07/28/2017   K 3.7 07/28/2017   CL 105 07/28/2017   CO2 29 07/28/2017   BUN 11 07/28/2017   CREATININE 0.82 07/28/2017   GLUCOSE 93 07/28/2017    Discharge Medications:   Allergies as of 08/12/2017      Reactions   Erythromycin Shortness Of Breath, Rash, Other (See Comments)   Reaction:  Unknown    Oxycontin [oxycodone]    Albuterol Rash, Other (See Comments)   "Jittery"      Medication List    TAKE these medications   acetaminophen 325 MG tablet Commonly known as:  TYLENOL Take 2 tablets (650 mg total) by mouth 4 (four) times daily. What changed:    when to take this  reasons to take this   alendronate 70 MG tablet Commonly known as:  FOSAMAX Take  1 tablet (70 mg total) by mouth once a week. Take with a full glass of water on an empty stomach. On Wednesday   bisacodyl 5 MG EC tablet Commonly known as:  DULCOLAX Take 1 tablet (5 mg total) by mouth at bedtime.   budesonide-formoterol 160-4.5 MCG/ACT inhaler Commonly known as:  SYMBICORT Inhale 2 puffs into the lungs 2 (two) times daily.   calcium-vitamin D 500-200 MG-UNIT tablet Commonly known as:  OSCAL WITH D Take 1 tablet by mouth daily with breakfast.   cholecalciferol 1000 units tablet Commonly known as:  VITAMIN D Take 1 tablet (1,000 Units total) by mouth daily.   cyanocobalamin 1000 MCG/ML injection Commonly known as:  (VITAMIN B-12) Inject 1 mL (1,000 mcg total) into the muscle once a week. For 2 doses starting Jan 30; then continue same dose once monthly   docusate sodium 100 MG capsule Commonly known  as:  COLACE Take 1 capsule (100 mg total) by mouth every other day.   enoxaparin 30 MG/0.3ML injection Commonly known as:  LOVENOX Inject 0.3 mLs (30 mg total) into the skin every 12 (twelve) hours.   folic acid 1 MG tablet Commonly known as:  FOLVITE TAKE ONE TABLET BY MOUTH ONCE A DAY. (FOLIC ACID SUPPLEMENT)   ketoconazole 2 % cream Commonly known as:  NIZORAL Apply 1 application topically daily. Apply to feet or affected area   ketotifen 0.025 % ophthalmic solution Commonly known as:  ZADITOR Place 1 drop into both eyes daily as needed (for allergies).   levalbuterol 0.63 MG/3ML nebulizer solution Commonly known as:  XOPENEX Take 0.63 mg by nebulization 3 (three) times daily as needed for wheezing or shortness of breath.   levothyroxine 125 MCG tablet Commonly known as:  SYNTHROID, LEVOTHROID Take 1 tablet (125 mcg total) by mouth daily before breakfast.   methotrexate 2.5 MG tablet Commonly known as:  RHEUMATREX TAKE 7 TABLETS (17.5MG) BY MOUTH EVERY WEEK ON FRIDAYS *NOTE DOSAGE*   montelukast 10 MG tablet Commonly known as:  SINGULAIR Take 1 tablet (10 mg total) by mouth at bedtime.   omeprazole 20 MG capsule Commonly known as:  PRILOSEC Take 1 capsule (20 mg total) by mouth 2 (two) times daily.   ondansetron 4 MG tablet Commonly known as:  ZOFRAN Take 1 tablet (4 mg total) by mouth every 8 (eight) hours as needed for nausea or vomiting.   PATANOL 0.1 % ophthalmic solution Generic drug:  olopatadine Place 1 drop into both eyes daily.   predniSONE 2.5 MG tablet Commonly known as:  DELTASONE Take 1 tablet (2.5 mg total) by mouth daily with breakfast.   senna 8.6 MG tablet Commonly known as:  SENEXON Take 1 tablet (8.6 mg total) by mouth at bedtime.   SYRINGE 3CC/25GX1" 25G X 1" 3 ML Misc Use for b12 injections   THEREMS-M Tabs TAKE ONE TABLET BY MOUTH EVERY DAY   traMADol 50 MG tablet Commonly known as:  ULTRAM Take 1-2 tablets (50-100 mg total) by  mouth every 4 (four) hours as needed for moderate pain.            Durable Medical Equipment  (From admission, onward)        Start     Ordered   08/10/17 1629  DME Walker rolling  Once    Question:  Patient needs a walker to treat with the following condition  Answer:  Total knee replacement status   08/10/17 1628   08/10/17 1629  DME Bedside commode  Once    Question:  Patient needs a bedside commode to treat with the following condition  Answer:  Total knee replacement status   08/10/17 1628      Diagnostic Studies: Dg Knee Right Port  Result Date: 08/10/2017 CLINICAL DATA:  Status post right knee replacement EXAM: PORTABLE RIGHT KNEE - 1-2 VIEW COMPARISON:  None. FINDINGS: Right knee prosthesis is noted. Surgical drain is seen in place. No acute abnormality is noted. IMPRESSION: Status post right knee replacement. Electronically Signed   By: Inez Catalina M.D.   On: 08/10/2017 15:37    Disposition: 03-Skilled Nursing Facility  Discharge Instructions    Increase activity slowly   Complete by:  As directed        Contact information for follow-up providers    Watt Climes, PA On 08/25/2017.   Specialty:  Physician Assistant Why:  at 10:15am Contact information: [place] [address] [phone]        Dereck Leep, MD On 09/22/2017.   Specialty:  Orthopedic Surgery Why:  at 9:45am Contact information: [address] [facility] [place] [address] [phone]            Contact information for after-discharge care    [facility] .   Service:  Skilled Nursing Contact information: [address] [place] [phone]                   Signed: Watt Climes 08/11/2017, 7:39 AM

## 2017-08-11 NOTE — Progress Notes (Signed)
Patient's BP at 1600 was 82/41.  When this was located in computer, ask NT to re-take it.  BP 81/50. Notified MD. 500 NS bolus ordered.

## 2017-08-11 NOTE — Evaluation (Signed)
Physical Therapy Evaluation Patient Details Name: Cindy Oconnor MRN: [id number] DOB: 31-Oct-1970 Today's Date: 08/11/2017   History of Present Illness  47 y/o female s/p R TKA 3/13, had L knee replaced in August.  Baseline Down's Syndrome, lives in group home."  Clinical Impression  Pt showed good effort during PT exam, she did have c/o pain with essentially all activity involving R LE, but was generally able to easily be re-directed to get back on task.  She showed good quad strength and ability do do SLRs despite needing some extra cuing to perform.  Pt nearly able to get to sitting EOB w/o assist, but did need considerable assist getting to standing and then attempting a few steps to get to recliner.     Follow Up Recommendations SNF    Equipment Recommendations       Recommendations for Other Services       Precautions / Restrictions Precautions Precautions: Fall Restrictions RLE Weight Bearing: Weight bearing as tolerated      Mobility  Bed Mobility Overal bed mobility: Needs Assistance Bed Mobility: Supine to Sit     Supine to sit: Min assist     General bed mobility comments: Pt with good effort, needed only light assist to get LEs to EOB  Transfers Overall transfer level: Needs assistance Equipment used: Rolling walker (2 wheeled) Transfers: Sit to/from Stand Sit to Stand: Min assist         General transfer comment: Pt c/o pain with WBing, needs assist to attain upright  Ambulation/Gait Ambulation/Gait assistance: Mod assist Ambulation Distance (Feet): 3 Feet Assistive device: Rolling walker (2 wheeled)       General Gait Details: Pt struggles with tolerating standing/weight shifting enough to take more than just a few small shuffle side steps to get to recliner.  Unable to do any real ambulation this session.  Stairs            Wheelchair Mobility    Modified Rankin (Stroke Patients Only)       Balance Overall balance assessment:  Needs assistance Sitting-balance support: No upper extremity supported Sitting balance-Leahy Scale: Good     Standing balance support: Bilateral upper extremity supported Standing balance-Leahy Scale: Poor Standing balance comment: Pt fearful and calling out in distracted pain during standing                             Pertinent Vitals/Pain Pain Assessment: (unrated, transiently calls out in pain with most acts, )    Home Living Family/patient expects to be discharged to:: Skilled nursing facility                 Additional Comments: Pt lives in group home    Prior Function Level of Independence: Independent with assistive device(s)(Pt has need walker since lead up to L TKA 7 months ago)         Comments: Pt able to be relatively active at group home     Hand Dominance        Extremity/Trunk Assessment   Upper Extremity Assessment Upper Extremity Assessment: Generalized weakness;Overall [facility] for tasks assessed    Lower Extremity Assessment Lower Extremity Assessment: Generalized weakness;Overall WFL for tasks assessed(able to do SLRs on R, poor tolerance secondary to pain )       Communication   Communication: HOH  Cognition Arousal/Alertness: Awake/alert Behavior During Therapy: Impulsive;Anxious Overall Cognitive Status: History of cognitive impairments - at baseline  General Comments      Exercises Total Joint Exercises Ankle Circles/Pumps: AROM;15 reps Quad Sets: Strengthening;15 reps Gluteal Sets: Strengthening;15 reps Short Arc Quad: 10 reps;AROM Heel Slides: PROM;5 reps Hip ABduction/ADduction: Strengthening;10 reps Straight Leg Raises: AROM;10 reps Knee Flexion: PROM;5 reps Goniometric ROM: 0-68   Assessment/Plan    PT Assessment Patient needs continued PT services  PT Problem List Decreased activity tolerance;Decreased strength;Decreased range of motion;Decreased  balance;Decreased knowledge of use of DME;Decreased safety awareness;Decreased mobility;Decreased coordination;Decreased cognition;Decreased knowledge of precautions;Pain       PT Treatment Interventions DME instruction;Gait training;Stair training;Functional mobility training;Therapeutic activities;Therapeutic exercise;Balance training;Neuromuscular re-education;Patient/family education    PT Goals (Current goals can be found in the Care Plan section)  Acute Rehab PT Goals Patient Stated Goal: Go to rehab and then live with mom during outpt PT PT Goal Formulation: With patient/family Time For Goal Achievement: 08/25/17 Potential to Achieve Goals: Good    Frequency BID   Barriers to discharge        Co-evaluation               AM-PAC PT "6 Clicks" Daily Activity  Outcome Measure Difficulty turning over in bed (including adjusting bedclothes, sheets and blankets)?: A Lot Difficulty moving from lying on back to sitting on the side of the bed? : Unable Difficulty sitting down on and standing up from a chair with arms (e.g., wheelchair, bedside commode, etc,.)?: Unable Help needed moving to and from a bed to chair (including a wheelchair)?: A Lot Help needed walking in hospital room?: Total Help needed climbing 3-5 steps with a railing? : Total 6 Click Score: 8    End of Session Equipment Utilized During Treatment: Gait belt Activity Tolerance: Patient limited by pain;Patient tolerated treatment well Patient left: with chair alarm set;with call bell/phone within reach Nurse Communication: Mobility status PT Visit Diagnosis: Muscle weakness (generalized) (M62.81);Difficulty in walking, not elsewhere classified (R26.2)    Time: 1010-1045 PT Time Calculation (min) (ACUTE ONLY): 35 min   Charges:   PT Evaluation $PT Eval Low Complexity: 1 Low PT Treatments $Therapeutic Exercise: 8-22 mins   PT G Codes:        Kreg Shropshire, DPT 08/11/2017, 1:09 PM

## 2017-08-12 NOTE — Progress Notes (Signed)
Physical Therapy Treatment Patient Details Name: Cindy Oconnor MRN: [id number] DOB: February 10, 1971 Today's Date: 08/12/2017    History of Present Illness 47 y/o female s/p R TKA 3/13, had L knee replaced in August.  Baseline Down's Syndrome, lives in group home."    PT Comments    Pt did better with PT session this afternoon, but is still limited with at times vague/at times tearful c/o pain and needing a lot of redirection and encouragement to stay on task and motivated.   She was able to take a few steps before calling out and saying she needed to sit, PT and mother were able to get her to do a little more and ultimately go ~15 ft but pain is very anxious and self limiting secondary to pain. Pt did tolerate increased ROM activity this afternoon (0-88 PROM) and is improving despite some expected challenges.  Follow Up Recommendations  SNF     Equipment Recommendations       Recommendations for Other Services       Precautions / Restrictions Precautions Precautions: Fall Restrictions RLE Weight Bearing: Weight bearing as tolerated    Mobility  Bed Mobility Overal bed mobility: Needs Assistance Bed Mobility: Sidelying to Sit     Supine to sit: Min assist     General bed mobility comments: Pt needed light assist to get LEs fully off EOB and to get to sitting, but ultimately did most of the effort w/o issue  Transfers Overall transfer level: Needs assistance Equipment used: Rolling walker (2 wheeled) Transfers: Sit to/from Stand Sit to Stand: Min assist         General transfer comment: Reminders for set up, UE use and safety, pt needing light assist to achieve upright  Ambulation/Gait Ambulation/Gait assistance: Min assist Ambulation Distance (Feet): 15 Feet Assistive device: Rolling walker (2 wheeled)       General Gait Details: Pt continues to be hesitant with WBing through the R, but was able to use the walker and take inconsistent but relatively safe steps  with recliner following.  Pt needed a lot of cuing and encouragment, but was able to do her best and farthest walk yet despite c/o pain and some anxiety and wanting to stop early   Stairs            Wheelchair Mobility    Modified Rankin (Stroke Patients Only)       Balance     Sitting balance-Leahy Scale: Good       Standing balance-Leahy Scale: Fair                              Cognition Arousal/Alertness: Lethargic Behavior During Therapy: Impulsive;Anxious Overall Cognitive Status: History of cognitive impairments - at baseline                                        Exercises Total Joint Exercises Quad Sets: Strengthening;15 reps Gluteal Sets: Strengthening;15 reps Short Arc Quad: AROM;5 reps Heel Slides: AROM;5 reps Hip ABduction/ADduction: Strengthening;10 reps Knee Flexion: PROM;5 reps Goniometric ROM: 0-88    General Comments        Pertinent Vitals/Pain Pain Score: (unable to rate, minimal pain initially, crying at times)    Home Living  Prior Function            PT Goals (current goals can now be found in the care plan section) Progress towards PT goals: Progressing toward goals    Frequency    BID      PT Plan Current plan remains appropriate    Co-evaluation              AM-PAC PT "6 Clicks" Daily Activity  Outcome Measure  Difficulty turning over in bed (including adjusting bedclothes, sheets and blankets)?: A Little Difficulty moving from lying on back to sitting on the side of the bed? : A Lot Difficulty sitting down on and standing up from a chair with arms (e.g., wheelchair, bedside commode, etc,.)?: Unable Help needed moving to and from a bed to chair (including a wheelchair)?: A Lot Help needed walking in hospital room?: Total Help needed climbing 3-5 steps with a railing? : Total 6 Click Score: 10    End of Session Equipment Utilized During Treatment:  Gait belt Activity Tolerance: Patient limited by pain;Patient tolerated treatment well Patient left: with chair alarm set;with call bell/phone within reach Nurse Communication: Mobility status PT Visit Diagnosis: Muscle weakness (generalized) (M62.81);Difficulty in walking, not elsewhere classified (R26.2)     Time: 1341-1406 PT Time Calculation (min) (ACUTE ONLY): 25 min  Charges:  $Gait Training: 8-22 mins $Therapeutic Exercise: 8-22 mins                    G Codes:       Kreg Shropshire, DPT 08/12/2017, 3:36 PM

## 2017-08-12 NOTE — Progress Notes (Signed)
Physical Therapy Treatment Patient Details Name: Cindy Oconnor MRN: [id number] DOB: 1970-10-29 Today's Date: 08/12/2017    History of Present Illness 47 y/o female s/p R TKA 3/13, had L knee replaced in August.  Baseline Down's Syndrome, lives in group home."    PT Comments    Pt more pain focused today and though she did well with simple/light in bed exercises she had poor tolerance to flexion ROM, resisted leg exercises and was especially anxious with ambulation/WBing.  Overall she did well and was able to do some bed mobility w/o assist, but generally needed a lot of cuing, encouragement and was unable to take true ambulation steps.    Follow Up Recommendations  SNF     Equipment Recommendations       Recommendations for Other Services       Precautions / Restrictions Precautions Precautions: Fall Restrictions Weight Bearing Restrictions: Yes RLE Weight Bearing: Weight bearing as tolerated    Mobility  Bed Mobility Overal bed mobility: Needs Assistance Bed Mobility: Sidelying to Sit     Supine to sit: Min guard     General bed mobility comments: Pt hesitant to get up to sitting, but ultimately with a lot of cuing/encourgement and extra time she was able to ge to EOB w/o assist  Transfers Overall transfer level: Needs assistance Equipment used: Rolling walker (2 wheeled) Transfers: Sit to/from Stand Sit to Stand: Min assist         General transfer comment: Pt cautious and guarded with standing, needed assist to fully lift to standing, calling out in pain with WBing t/o the effort  Ambulation/Gait Ambulation/Gait assistance: Mod assist Ambulation Distance (Feet): 3 Feet Assistive device: Rolling walker (2 wheeled)       General Gait Details: Pt not truly able to ambulate today, did manage some very hesitant steppage to get to the recliner.  Pt mostly limited due to pain/fear of pain   Stairs            Wheelchair Mobility    Modified  Rankin (Stroke Patients Only)       Balance     Sitting balance-Leahy Scale: Good       Standing balance-Leahy Scale: Poor                              Cognition Arousal/Alertness: Awake/alert Behavior During Therapy: Impulsive;Anxious Overall Cognitive Status: History of cognitive impairments - at baseline                                        Exercises Total Joint Exercises Ankle Circles/Pumps: AROM;15 reps Quad Sets: Strengthening;15 reps Gluteal Sets: Strengthening;15 reps Short Arc Quad: 10 reps;AROM Heel Slides: 5 reps;AROM Hip ABduction/ADduction: Strengthening;10 reps Straight Leg Raises: AROM;10 reps Knee Flexion: PROM;5 reps Goniometric ROM: 0-72, pt very pain hesitant with this - unable to get to full flexion    General Comments        Pertinent Vitals/Pain Pain Assessment: 0-10 Pain Score: 6 (to the point of crying with ROM/WBing)    Home Living                      Prior Function            PT Goals (current goals can now be found in the care plan section) Progress towards  PT goals: Progressing toward goals    Frequency    BID      PT Plan      Co-evaluation              AM-PAC PT "6 Clicks" Daily Activity  Outcome Measure  Difficulty turning over in bed (including adjusting bedclothes, sheets and blankets)?: A Little Difficulty moving from lying on back to sitting on the side of the bed? : A Lot Difficulty sitting down on and standing up from a chair with arms (e.g., wheelchair, bedside commode, etc,.)?: Unable Help needed moving to and from a bed to chair (including a wheelchair)?: A Lot Help needed walking in hospital room?: Total Help needed climbing 3-5 steps with a railing? : Total 6 Click Score: 10    End of Session Equipment Utilized During Treatment: Gait belt Activity Tolerance: Patient limited by pain;Patient tolerated treatment well Patient left: with chair alarm set;with  call bell/phone within reach Nurse Communication: Mobility status PT Visit Diagnosis: Muscle weakness (generalized) (M62.81);Difficulty in walking, not elsewhere classified (R26.2)     Time: 2035-5974 PT Time Calculation (min) (ACUTE ONLY): 26 min  Charges:  $Gait Training: 8-22 mins $Therapeutic Exercise: 8-22 mins                    G Codes:       Kreg Shropshire, DPT 08/12/2017, 10:40 AM

## 2017-08-12 NOTE — Progress Notes (Signed)
Plan is for patient to D/C to [facility] tomorrow pending medical clearance. Per [facility] admissions coordinator at [facility] patient will go to semi-private room 209-B. Per Sharyn Lull there is no roommate right now but that may change. RN will call report at [phone]. Patient's guardian/ mother [name] is aware of above. Clinical Education officer, museum (CSW) sent D/C summary to [place] today via [place].   [employer], LCSW [phone]

## 2017-08-12 NOTE — Progress Notes (Signed)
Patient's mother came out to desk and inquired about getting pain meds for the patient.  BP 90/55. Tramadol given at 0755.  Notified MD. MD stated to given Tramadol 140m now and she how she does.  This RN gave 1050mof Tramadol yesterday and patient was really sleepy after.  When this RN brought meds into room patient was in bed with her eyes closed. Reviewed with patient and mother about having a low BP and allergy to Oxy there wasn't a lot of choices.  Gave med and will continue to monitor.

## 2017-08-12 NOTE — Progress Notes (Signed)
   Subjective: 2 Days Post-Op Procedure(s) (LRB): COMPUTER ASSISTED TOTAL KNEE ARTHROPLASTY (Right) Patient reports pain as 8 on 0-10 scale.   Patient is well, and has had no acute complaints or problems Patient did fair with physical therapy.  Was able to ambulate 9 feet. Plan is to go Rehab after hospital stay. no nausea and no vomiting Patient denies any chest pains or shortness of breath. Objective: Vital signs in last 24 hours: Temp:  [97.8 F (36.6 C)-98.6 F (37 C)] 98.2 F (36.8 C) (03/15 0506) Pulse Rate:  [58-78] 67 (03/15 0506) Resp:  [17-18] 17 (03/15 0506) BP: (82-96)/(36-49) 92/36 (03/15 0600) SpO2:  [95 %-100 %] 97 % (03/15 0506) well approximated incision Heels are non tender and elevated off the bed using rolled towels Intake/Output from previous day: 03/14 0701 - 03/15 0700 In: 1965 [P.O.:720; I.V.:745; IV Piggyback:500] Out: 180 [Drains:180] Intake/Output this shift: No intake/output data recorded.  No results for input(s): HGB in the last 72 hours. No results for input(s): WBC, RBC, HCT, PLT in the last 72 hours. No results for input(s): NA, K, CL, CO2, BUN, CREATININE, GLUCOSE, CALCIUM in the last 72 hours. No results for input(s): LABPT, INR in the last 72 hours.  EXAM General - Patient is Alert, Appropriate and Oriented Extremity - Neurologically intact Neurovascular intact Sensation intact distally Intact pulses distally Dorsiflexion/Plantar flexion intact No cellulitis present Compartment soft Dressing - dressing C/D/I Motor Function - intact, moving foot and toes well on exam. V   Past Medical History:  Diagnosis Date  . Arthritis   . Asthma    unspecified  . Blood clot in vein   . Chicken pox   . Colitis   . COPD (chronic obstructive pulmonary disease) ([place])   . Deafness in left ear   . Disease of thyroid gland   . Down syndrome   . DVT (deep venous thrombosis) ([place])   . Fractures   . GERD (gastroesophageal reflux disease)   .  Hypothyroidism    unspecified  . 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 Positive anti-CCP antibodies, negative rheumatoid factor, positive FANA. b. Methotrexate, Prednisone, Plaquenil.   . Joint pain   . Lupus 1995  . Osteoarthritis 01/30/2014   a. Lumbar disc disease. b. Trigger nodules  . Osteoporosis 01/30/2014   a. Boniva  . RA (rheumatoid arthritis) ([place])   . Shingles   . 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 Ulcerative colitis ([place])     Assessment/Plan: 2 Days Post-Op Procedure(s) (LRB): COMPUTER ASSISTED TOTAL KNEE ARTHROPLASTY (Right) Active Problems:   S/P total knee arthroplasty  Estimated body mass index is 37.45 kg/m as calculated from the following:   Height as of this encounter: 4' 9"  (1.448 m).   Weight as of this encounter: 78.5 kg (173 lb 1 oz). Up with therapy Plan for discharge tomorrow Discharge to SNF  Labs: None DVT Prophylaxis - Lovenox, Foot Pumps and TED hose Weight-Bearing as tolerated to right leg Hemovac was discontinued on today's visit.  Ends of the drains appeared to be intact. Please wash operative leg and apply TED stockings to both legs Please change dressing prior to discharge. Patient needs a bowel movement  Amandalee Lacap R. [address] 08/12/2017, 7:38 AM

## 2017-08-13 DIAGNOSIS — R4182 Altered mental status, unspecified: Secondary | ICD-10-CM | POA: Diagnosis not present

## 2017-08-13 DIAGNOSIS — E039 Hypothyroidism, unspecified: Secondary | ICD-10-CM | POA: Diagnosis not present

## 2017-08-13 DIAGNOSIS — M6281 Muscle weakness (generalized): Secondary | ICD-10-CM | POA: Diagnosis not present

## 2017-08-13 DIAGNOSIS — R262 Difficulty in walking, not elsewhere classified: Secondary | ICD-10-CM | POA: Diagnosis not present

## 2017-08-13 DIAGNOSIS — J4540 Moderate persistent asthma, uncomplicated: Secondary | ICD-10-CM | POA: Diagnosis not present

## 2017-08-13 DIAGNOSIS — I071 Rheumatic tricuspid insufficiency: Secondary | ICD-10-CM | POA: Diagnosis not present

## 2017-08-13 DIAGNOSIS — Z7952 Long term (current) use of systemic steroids: Secondary | ICD-10-CM | POA: Diagnosis not present

## 2017-08-13 DIAGNOSIS — Z743 Need for continuous supervision: Secondary | ICD-10-CM | POA: Diagnosis not present

## 2017-08-13 DIAGNOSIS — Z471 Aftercare following joint replacement surgery: Secondary | ICD-10-CM | POA: Diagnosis not present

## 2017-08-13 DIAGNOSIS — Z86718 Personal history of other venous thrombosis and embolism: Secondary | ICD-10-CM | POA: Diagnosis not present

## 2017-08-13 DIAGNOSIS — M069 Rheumatoid arthritis, unspecified: Secondary | ICD-10-CM | POA: Diagnosis not present

## 2017-08-13 DIAGNOSIS — M1990 Unspecified osteoarthritis, unspecified site: Secondary | ICD-10-CM | POA: Diagnosis not present

## 2017-08-13 DIAGNOSIS — M810 Age-related osteoporosis without current pathological fracture: Secondary | ICD-10-CM | POA: Diagnosis not present

## 2017-08-13 DIAGNOSIS — J4541 Moderate persistent asthma with (acute) exacerbation: Secondary | ICD-10-CM | POA: Diagnosis not present

## 2017-08-13 DIAGNOSIS — E538 Deficiency of other specified B group vitamins: Secondary | ICD-10-CM | POA: Diagnosis not present

## 2017-08-13 DIAGNOSIS — Z7951 Long term (current) use of inhaled steroids: Secondary | ICD-10-CM | POA: Diagnosis not present

## 2017-08-13 DIAGNOSIS — K219 Gastro-esophageal reflux disease without esophagitis: Secondary | ICD-10-CM | POA: Diagnosis not present

## 2017-08-13 DIAGNOSIS — R569 Unspecified convulsions: Secondary | ICD-10-CM | POA: Diagnosis not present

## 2017-08-13 DIAGNOSIS — M1711 Unilateral primary osteoarthritis, right knee: Secondary | ICD-10-CM | POA: Diagnosis not present

## 2017-08-13 DIAGNOSIS — Z96651 Presence of right artificial knee joint: Secondary | ICD-10-CM | POA: Diagnosis not present

## 2017-08-13 DIAGNOSIS — Q909 Down syndrome, unspecified: Secondary | ICD-10-CM | POA: Diagnosis not present

## 2017-08-13 DIAGNOSIS — K5190 Ulcerative colitis, unspecified, without complications: Secondary | ICD-10-CM | POA: Diagnosis not present

## 2017-08-13 DIAGNOSIS — K5900 Constipation, unspecified: Secondary | ICD-10-CM | POA: Diagnosis not present

## 2017-08-13 DIAGNOSIS — J449 Chronic obstructive pulmonary disease, unspecified: Secondary | ICD-10-CM | POA: Diagnosis not present

## 2017-08-13 NOTE — Clinical Social Work Note (Signed)
The patient will discharge to [facility] today via non-emergent EMS. The facility and the patient's legal guardian (mother) are aware and in agreement. The CSW has delivered the discharge packet. The CSW is signing off. Please consult should additional needs arise.  Santiago Bumpers, MSW, Latanya Presser [phone]

## 2017-08-13 NOTE — Progress Notes (Signed)
Pt VS sable. Pt discharging to [facility] facility. Report called to RN, EMS called. awaiting on transport. Pt mother at bedside. Will continue to monitor.  Dorna Bloom, SN [facility]

## 2017-08-13 NOTE — Progress Notes (Signed)
   Subjective: 3 Days Post-Op Procedure(s) (LRB): COMPUTER ASSISTED TOTAL KNEE ARTHROPLASTY (Right) Patient reports pain as mild to moderate Patient is well, and has had no acute complaints or problems Patient did fair with physical therapy.  Was able to ambulate 15 feet. Plan is to go Rehab after hospital stay. no nausea and no vomiting Patient denies any chest pains or shortness of breath.  Objective: Vital signs in last 24 hours: Temp:  [97.7 F (36.5 C)-98.5 F (36.9 C)] 97.7 F (36.5 C) (03/16 0420) Pulse Rate:  [70-80] 70 (03/16 0420) Resp:  [16-18] 16 (03/16 0420) BP: (86-108)/(38-63) 96/38 (03/16 0420) SpO2:  [97 %-99 %] 97 % (03/16 0420) well approximated incision Heels are non tender and elevated off the bed using rolled towels Intake/Output from previous day: 03/15 0701 - 03/16 0700 In: 840 [P.O.:840] Out: -  Intake/Output this shift: No intake/output data recorded.  No results for input(s): HGB in the last 72 hours. No results for input(s): WBC, RBC, HCT, PLT in the last 72 hours. No results for input(s): NA, K, CL, CO2, BUN, CREATININE, GLUCOSE, CALCIUM in the last 72 hours. No results for input(s): LABPT, INR in the last 72 hours.  EXAM General - Patient is Alert, Appropriate and Oriented Extremity - Neurologically intact Neurovascular intact Sensation intact distally Intact pulses distally Dorsiflexion/Plantar flexion intact No cellulitis present Compartment soft Dressing - dressing C/D/I Motor Function - intact, moving foot and toes well on exam.   Past Medical History:  Diagnosis Date  . Arthritis   . Asthma    unspecified  . Blood clot in vein   . Chicken pox   . Colitis   . COPD (chronic obstructive pulmonary disease) ([place])   . Deafness in left ear   . Disease of thyroid gland   . Down syndrome   . DVT (deep venous thrombosis) ([place])   . Fractures   . GERD (gastroesophageal reflux disease)   . Hypothyroidism    unspecified  .  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 Positive anti-CCP antibodies, negative rheumatoid factor, positive FANA. b. Methotrexate, Prednisone, Plaquenil.   . Joint pain   . Lupus 1995  . Osteoarthritis 01/30/2014   a. Lumbar disc disease. b. Trigger nodules  . Osteoporosis 01/30/2014   a. Boniva  . RA (rheumatoid arthritis) ([place])   . Shingles   . 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 Ulcerative colitis ([place])     Assessment/Plan: 3 Days Post-Op Procedure(s) (LRB): COMPUTER ASSISTED TOTAL KNEE ARTHROPLASTY (Right) Active Problems:   S/P total knee arthroplasty  Estimated body mass index is 37.45 kg/m as calculated from the following:   Height as of this encounter: 4' 9"  (1.448 m).   Weight as of this encounter: 78.5 kg (173 lb 1 oz). Continue physical therapy. Discharge plan to rehab today  Labs: None DVT Prophylaxis - Lovenox, Foot Pumps and TED hose Weight-Bearing as tolerated to right leg Please wash operative leg and apply TED stockings to both legs Please change dressing prior to discharge.   Reche Dixon [facility] 08/13/2017, 7:03 AM

## 2017-08-13 NOTE — Progress Notes (Signed)
Physical Therapy Treatment Patient Details Name: Cindy Oconnor MRN: [id number] DOB: 02-Apr-1971 Today's Date: 08/13/2017    History of Present Illness 47 y/o female s/p R TKA 3/13, had L knee replaced in August.  Baseline Down's Syndrome, lives in group home."    PT Comments    Pt agreeable to PT; reports moderate pain at rest, but increases to severe intermittently with activity (paitent tearful). Pt states concern of "head shaking" uncontrollably. Mother has concerns regarding medications and will discuss with nursing. Pt demonstrates good mobility to edge of bed with only Min guard for safety. Demonstrating good active range of motion with R knee. Mild impulsivity with sudden standing from bed wishing to sit in chair. Pt ambulates 6 ft with HHA to chair with guarded small step to pattern. Pt becoming increasingly lethargic, but does participate in more isometric strengthening for R quad. Pt has tendency to externally rotate hip on R bending R knee. Pt positioned with pillow to encourage extension. Pt to discharge to skilled nursing facility today to continue rehab efforts.   Follow Up Recommendations  SNF     Equipment Recommendations       Recommendations for Other Services       Precautions / Restrictions Precautions Precautions: Fall Restrictions Weight Bearing Restrictions: Yes RLE Weight Bearing: Weight bearing as tolerated    Mobility  Bed Mobility Overal bed mobility: Needs Assistance Bed Mobility: Supine to Sit   Sidelying to sit: Min guard Supine to sit: Min guard     General bed mobility comments: Pt sits independently to long sit in bed and mobilizes to edge of bed with Min guard for safety and increased time with pauses due to pain  Transfers Overall transfer level: Needs assistance Equipment used: 1 person hand held assist Transfers: Sit to/from Stand Sit to Stand: Min guard         General transfer comment: Pt stands without warning/instruction to  do so, reaching for HHA. Decreased weight shift to RLE, but steady with HHA. Does not want to sit again to use rw, but begins to walk toward chair  Ambulation/Gait Ambulation/Gait assistance: Min assist Ambulation Distance (Feet): 6 Feet Assistive device: 1 person hand held assist Gait Pattern/deviations: Step-to pattern;Decreased step length - right;Decreased step length - left;Decreased stance time - right;Decreased weight shift to right     General Gait Details: Decreased weight shift to R walking more so on toe; Min A for steadiness. Small step to pattern guarding RLE   Stairs            Wheelchair Mobility    Modified Rankin (Stroke Patients Only)       Balance Overall balance assessment: Needs assistance Sitting-balance support: Feet supported;No upper extremity supported Sitting balance-Leahy Scale: Good     Standing balance support: Single extremity supported Standing balance-Leahy Scale: Fair                              Cognition Arousal/Alertness: Lethargic Behavior During Therapy: Impulsive(Mild impulsive, anxious, upset) Overall Cognitive Status: History of cognitive impairments - at baseline                                 General Comments: Pt alternates between calm to tearful/pain limited and concerned with head shaking with inability to control      Exercises Total Joint Exercises Quad Sets: Strengthening;Right;Both;10 reps(2 sets)  Long Arc Quad: AROM;Right;10 reps;Seated(2 sets) Knee Flexion: AROM;Right;Seated;10 reps(2 sets) Goniometric ROM: 0-85    General Comments        Pertinent Vitals/Pain Pain Assessment: Faces Faces Pain Scale: Hurts worst(Moderate at rest to 10 with exercise/arom stretch) Pain Location: R knee Pain Descriptors / Indicators: ("it hurts") Pain Intervention(s): Limited activity within patient's tolerance;Monitored during session;Premedicated before session;Repositioned;Ice applied     Home Living                      Prior Function            PT Goals (current goals can now be found in the care plan section) Progress towards PT goals: Progressing toward goals    Frequency    BID      PT Plan Current plan remains appropriate    Co-evaluation              AM-PAC PT "6 Clicks" Daily Activity  Outcome Measure  Difficulty turning over in bed (including adjusting bedclothes, sheets and blankets)?: A Little Difficulty moving from lying on back to sitting on the side of the bed? : A Little Difficulty sitting down on and standing up from a chair with arms (e.g., wheelchair, bedside commode, etc,.)?: Unable Help needed moving to and from a bed to chair (including a wheelchair)?: A Little Help needed walking in hospital room?: A Little Help needed climbing 3-5 steps with a railing? : Total 6 Click Score: 14    End of Session   Activity Tolerance: Patient tolerated treatment well;Patient limited by pain;Patient limited by lethargy Patient left: in chair;with call bell/phone within reach;with family/visitor present;Other (comment)(Chair alarm off; mother will stay and call for assist)   PT Visit Diagnosis: Muscle weakness (generalized) (M62.81);Difficulty in walking, not elsewhere classified (R26.2)     Time: 3149-7026 PT Time Calculation (min) (ACUTE ONLY): 30 min  Charges:  $Gait Training: 8-22 mins $Therapeutic Exercise: 8-22 mins                    G Codes:        Larae Grooms, PTA 08/13/2017, 11:17 AM

## 2017-08-13 NOTE — Progress Notes (Signed)
Pr rested comfortably during the night. She was  Medicated every 4 hours with tramadol 50 mgm as requested by her mother. She is alert and oriented.

## 2017-08-16 DIAGNOSIS — J4541 Moderate persistent asthma with (acute) exacerbation: Secondary | ICD-10-CM | POA: Diagnosis not present

## 2017-08-16 DIAGNOSIS — M1990 Unspecified osteoarthritis, unspecified site: Secondary | ICD-10-CM | POA: Diagnosis not present

## 2017-08-16 DIAGNOSIS — Q909 Down syndrome, unspecified: Secondary | ICD-10-CM | POA: Diagnosis not present

## 2017-08-26 ENCOUNTER — Non-Acute Institutional Stay (SKILLED_NURSING_FACILITY): Payer: Medicare Other | Admitting: Gerontology

## 2017-08-26 DIAGNOSIS — Z96651 Presence of right artificial knee joint: Secondary | ICD-10-CM | POA: Diagnosis not present

## 2017-08-26 DIAGNOSIS — M1711 Unilateral primary osteoarthritis, right knee: Secondary | ICD-10-CM

## 2017-08-29 ENCOUNTER — Encounter
Admission: RE | Admit: 2017-08-29 | Discharge: 2017-08-29 | Disposition: A | Discharge status: Home / Self Care | Payer: Medicare Other | Source: Ambulatory Visit | Attending: Internal Medicine | Admitting: Internal Medicine

## 2017-08-29 NOTE — Assessment & Plan Note (Signed)
Pt was admitted to the facility for rehab following a Right Total Knee Replacement. Pt has been participating int PT/OT. Pt has been progressing well. Pt participates in activities and is very sociable. Pt reports her pain is well controlled. At first, she was scared of the pain medications. She has since taken the pain meds with favorable results. Pts appetite is normal. Voiding well and having regular BMs. Pt went to the Orthopedist yesterday and had the staples removed. Steri-strips in place. Incision well approximated. No redness or drainage. VSS. No other complaints.

## 2017-08-29 NOTE — Progress Notes (Signed)
Location:      Place of Service:    Provider:  Toni Arthurs, NP-C  Crecencio Mc, MD  Patient Care Team: Crecencio Mc, MD as PCP - General (Internal Medicine) Jackolyn Confer, MD (Internal Medicine)  Extended Emergency Contact Information Primary Emergency Contact: Morris,Kay Address: [address]          [place], [place] Phone: [phone] Mobile Phone: [phone] Relation: Mother Secondary Emergency Contact: Karren Cobble [place] Phone: [phone] Relation: Father  Code Status:  DNR Goals of care: Advanced Directive information Advanced Directives 08/10/2017  Does Patient Have a Medical Advance Directive? No  Would patient like information on creating a medical advance directive? No - Patient declined     No chief complaint on file.   HPI:  Pt is a 47 y.o. female seen today for medical management of chronic diseases.    S/P total knee arthroplasty Pt was admitted to the facility for rehab following a Right Total Knee Replacement. Pt has been participating int PT/OT. Pt has been progressing well. Pt participates in activities and is very sociable. Pt reports her pain is well controlled. At first, she was scared of the pain medications. She has since taken the pain meds with favorable results. Pts appetite is normal. Voiding well and having regular BMs. Pt went to the Orthopedist yesterday and had the staples removed. Steri-strips in place. Incision well approximated. No redness or drainage. VSS. No other complaints.   Please note pt with limited verbal/cognitive ability. Unable to obtain complete ROS. Some ROS info obtained from staff and documentation.   Past Medical History:  Diagnosis Date  . Arthritis   . Asthma    unspecified  . Blood clot in vein   . Chicken pox   . Colitis   . COPD (chronic obstructive pulmonary disease) ([place])   . Deafness in left ear   . Disease of thyroid gland   .  Down syndrome   . DVT (deep venous thrombosis) ([place])   . Fractures   . GERD (gastroesophageal reflux disease)   . Hypothyroidism    unspecified  . 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 Positive anti-CCP antibodies, negative rheumatoid factor, positive FANA. b. Methotrexate, Prednisone, Plaquenil.   . Joint pain   . Lupus 1995  . Osteoarthritis 01/30/2014   a. Lumbar disc disease. b. Trigger nodules  . Osteoporosis 01/30/2014   a. [place]  . RA (rheumatoid arthritis) ([place])   . Shingles   . 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 Ulcerative colitis [facility]    Past Surgical History:  Procedure Laterality Date  . ABDOMINAL HYSTERECTOMY    . ABDOMINAL HYSTERECTOMY W/ PARTIAL [place]  . BUNIONECTOMY Bilateral    Bunionectomy great toe  . COLONOSCOPY  04/16/2013   Hx of ulcerative colitis - repeat 2 years per Dr. Rayann Heman  . COLONOSCOPY  09/24/2003   Dr. Brita Romp  . FOOT SURGERY Bilateral    x2  . HALLUX VALGUS CORRECTION    . KNEE ARTHROPLASTY Left 01/10/2017   Procedure: COMPUTER ASSISTED TOTAL KNEE ARTHROPLASTY;  Surgeon: Dereck Leep, MD;  Location: [facility] ORS;  Service: Orthopedics;  Laterality: Left;  . KNEE ARTHROPLASTY Right 08/10/2017   Procedure: COMPUTER ASSISTED TOTAL KNEE ARTHROPLASTY;  Surgeon: Dereck Leep, MD;  Location: [facility] ORS;  Service: Orthopedics;  Laterality: Right;    Allergies  Allergen Reactions  . Erythromycin Shortness Of Breath, Rash and Other (See  Comments)    Reaction:  Unknown    . Oxycontin [Oxycodone]   . Albuterol Rash and Other (See Comments)    "Jittery"     Allergies as of 08/26/2017      Reactions   Erythromycin Shortness Of Breath, Rash, Other (See Comments)   Reaction:  Unknown    Oxycontin [oxycodone]    Albuterol Rash, Other (See Comments)   "Jittery"      Medication List        Accurate as of 08/26/17 11:59 PM. Always use your most recent med list.          acetaminophen 325 MG tablet Commonly known  as:  TYLENOL Take 2 tablets (650 mg total) by mouth 4 (four) times daily.   alendronate 70 MG tablet Commonly known as:  FOSAMAX Take 1 tablet (70 mg total) by mouth once a week. Take with a full glass of water on an empty stomach. On Wednesday   bisacodyl 5 MG EC tablet Commonly known as:  DULCOLAX Take 1 tablet (5 mg total) by mouth at bedtime.   budesonide-formoterol 160-4.5 MCG/ACT inhaler Commonly known as:  SYMBICORT Inhale 2 puffs into the lungs 2 (two) times daily.   calcium-vitamin D 500-200 MG-UNIT tablet Commonly known as:  OSCAL WITH D Take 1 tablet by mouth daily with breakfast.   cholecalciferol 1000 units tablet Commonly known as:  VITAMIN D Take 1 tablet (1,000 Units total) by mouth daily.   cyanocobalamin 1000 MCG/ML injection Commonly known as:  (VITAMIN B-12) Inject 1 mL (1,000 mcg total) into the muscle once a week. For 2 doses starting Jan 30; then continue same dose once monthly   docusate sodium 100 MG capsule Commonly known as:  COLACE Take 1 capsule (100 mg total) by mouth every other day.   enoxaparin 30 MG/0.3ML injection Commonly known as:  LOVENOX Inject 0.3 mLs (30 mg total) into the skin every 12 (twelve) hours.   folic acid 1 MG tablet Commonly known as:  FOLVITE TAKE ONE TABLET BY MOUTH ONCE A DAY. (FOLIC ACID SUPPLEMENT)   ketoconazole 2 % cream Commonly known as:  NIZORAL Apply 1 application topically daily. Apply to feet or affected area   ketotifen 0.025 % ophthalmic solution Commonly known as:  ZADITOR Place 1 drop into both eyes daily as needed (for allergies).   levalbuterol 0.63 MG/3ML nebulizer solution Commonly known as:  XOPENEX Take 0.63 mg by nebulization 3 (three) times daily as needed for wheezing or shortness of breath.   levothyroxine 125 MCG tablet Commonly known as:  SYNTHROID, LEVOTHROID Take 1 tablet (125 mcg total) by mouth daily before breakfast.   methotrexate 2.5 MG tablet Commonly known as:   RHEUMATREX TAKE 7 TABLETS (17.5MG) BY MOUTH EVERY WEEK ON FRIDAYS *NOTE DOSAGE*   montelukast 10 MG tablet Commonly known as:  SINGULAIR Take 1 tablet (10 mg total) by mouth at bedtime.   omeprazole 20 MG capsule Commonly known as:  PRILOSEC Take 1 capsule (20 mg total) by mouth 2 (two) times daily.   ondansetron 4 MG tablet Commonly known as:  ZOFRAN Take 1 tablet (4 mg total) by mouth every 8 (eight) hours as needed for nausea or vomiting.   PATANOL 0.1 % ophthalmic solution Generic drug:  olopatadine Place 1 drop into both eyes daily.   predniSONE 2.5 MG tablet Commonly known as:  DELTASONE Take 1 tablet (2.5 mg total) by mouth daily with breakfast.   senna 8.6 MG tablet Commonly known as:  SENEXON Take 1 tablet (8.6 mg total) by mouth at bedtime.   SYRINGE 3CC/25GX1" 25G X 1" 3 ML Misc Use for b12 injections   THEREMS-M Tabs TAKE ONE TABLET BY MOUTH EVERY DAY   traMADol 50 MG tablet Commonly known as:  ULTRAM Take 1-2 tablets (50-100 mg total) by mouth every 4 (four) hours as needed for moderate pain.       Review of Systems  Unable to perform ROS: Other  Constitutional: Negative for activity change, appetite change, chills, diaphoresis and fever.  HENT: Negative for congestion, mouth sores, nosebleeds, postnasal drip, sneezing, sore throat, trouble swallowing and voice change.   Respiratory: Negative for apnea, cough, choking, chest tightness, shortness of breath and wheezing.   Cardiovascular: Negative for chest pain, palpitations and leg swelling.  Gastrointestinal: Negative for abdominal distention, abdominal pain, constipation, diarrhea and nausea.  Genitourinary: Negative for difficulty urinating, dysuria, frequency and urgency.  Musculoskeletal: Positive for arthralgias (typical arthritis) and gait problem. Negative for back pain and myalgias.  Skin: Positive for wound. Negative for color change, pallor and rash.  Neurological: Positive for weakness.  Negative for dizziness, tremors, syncope, speech difficulty, numbness and headaches.  Psychiatric/Behavioral: Negative for agitation and behavioral problems.  All other systems reviewed and are negative.   Immunization History  Administered Date(s) Administered  . Influenza Whole 03/26/2006, 05/30/2007, 05/31/2009, 01/29/2010  . Influenza,inj,Quad PF,6+ Mos 04/11/2014, 02/27/2015, 03/25/2016, 03/15/2017  . Influenza-Unspecified 02/29/2012, 04/11/2014  . Pneumococcal Polysaccharide-23 03/26/2006, 06/07/2016  . Td 08/05/2011   Pertinent  Health Maintenance Due  Topic Date Due  . MAMMOGRAM  08/19/2017  . INFLUENZA VACCINE  12/29/2017   Fall Risk  12/28/2016 05/19/2015 04/20/2012  Falls in the past year? No No No   Functional Status Survey:    There were no vitals filed for this visit. There is no height or weight on file to calculate BMI. Physical Exam  Constitutional: She is oriented to person, place, and time. Vital signs are normal. She appears well-developed and well-nourished. She is active and cooperative. She does not appear ill. No distress.  HENT:  Head: Normocephalic and atraumatic.  Mouth/Throat: Uvula is midline, oropharynx is clear and moist and mucous membranes are normal. Mucous membranes are not pale, not dry and not cyanotic.  Eyes: Pupils are equal, round, and reactive to light. Conjunctivae, EOM and lids are normal.  Neck: Trachea normal, normal range of motion and full passive range of motion without pain. Neck supple. No JVD present. No tracheal deviation, no edema and no erythema present. No thyromegaly present.  Cardiovascular: Normal rate, regular rhythm, normal heart sounds, intact distal pulses and normal pulses. Exam reveals no gallop, no distant heart sounds and no friction rub.  No murmur heard. Pulses:      Dorsalis pedis pulses are 2+ on the right side, and 2+ on the left side.  No edema  Pulmonary/Chest: Effort normal and breath sounds normal. No  accessory muscle usage. No respiratory distress. She has no decreased breath sounds. She has no wheezes. She has no rhonchi. She has no rales. She exhibits no tenderness.  Abdominal: Soft. Normal appearance and bowel sounds are normal. She exhibits no distension and no ascites. There is no tenderness.  Musculoskeletal: She exhibits no edema.       Right knee: She exhibits decreased range of motion and laceration. Tenderness found.  Expected osteoarthritis, stiffness; Bilateral Calves soft, supple. Negative Homan's Sign. B- pedal pulses equal; polar care in place  Neurological: She is alert and  oriented to person, place, and time. She has normal strength. A sensory deficit is present. Coordination and gait abnormal.  Skin: Skin is warm and dry. Laceration (right knee incision) noted. She is not diaphoretic. No cyanosis. No pallor. Nails show no clubbing.  Psychiatric: She has a normal mood and affect. Her speech is normal and behavior is normal. Judgment and thought content normal. Cognition and memory are normal.  Nursing note and vitals reviewed.   Labs reviewed: Recent Labs    03/15/17 1347 06/06/17 1035 07/28/17 1019  NA 141 140 142  K 4.3 4.1 3.7  CL 105 101 105  CO2 28 31 29   GLUCOSE 115* 60* 93  BUN 16 18 11   CREATININE 0.93 0.81 0.82  CALCIUM 8.9  9.0 9.2 8.7*   Recent Labs    01/05/17 1226 06/06/17 1035 07/28/17 1019  AST 27 35 29  ALT 28 24 14   ALKPHOS 87 61 65  BILITOT 0.4 0.4 0.6  PROT 7.4 6.9 7.1  ALBUMIN 4.3 3.9 3.7   Recent Labs    12/31/16 1755 01/05/17 1226  01/12/17 0407 06/06/17 1035 07/28/17 1019  WBC 8.1 9.2   < > 9.3 5.9 5.5  NEUTROABS 7.7* 7.5  --   --  4.4  --   HGB 12.8 14.0   < > 10.6* 13.8 12.8  HCT 37.0 43.2   < > 31.4* 42.1 38.2  MCV 105.6* 107.8*   < > 103.1* 100.9* 102.5*  PLT 296 410.0*   < > 306 325.0 347   < > = values in this interval not displayed.   Lab Results  Component Value Date   TSH 4.40 06/07/2016   Lab Results   Component Value Date   HGBA1C 5.7 06/27/2013   Lab Results  Component Value Date   CHOL 174 05/19/2015   HDL 47.50 05/19/2015   LDLCALC 107 (H) 05/19/2015   TRIG 97.0 05/19/2015   CHOLHDL 4 05/19/2015    Significant Diagnostic Results in last 30 days:  Dg Knee Right Port  Result Date: 08/10/2017 CLINICAL DATA:  Status post right knee replacement EXAM: PORTABLE RIGHT KNEE - 1-2 VIEW COMPARISON:  None. FINDINGS: Right knee prosthesis is noted. Surgical drain is seen in place. No acute abnormality is noted. IMPRESSION: Status post right knee replacement. Electronically Signed   By: Inez Catalina M.D.   On: 08/10/2017 15:37    Assessment/Plan Diagnoses and all orders for this visit:  Primary osteoarthritis of right knee  Status post total right knee replacement   Continue working with PT/OT  Continue exercises as taught by PT/OT  Continue ice pack/ polar care  Continue skin care per protocol  Continue Lovenox 30 mg SQ Q 12 hours for DVT prophylaxis  Continue Tramadol 50 mg 1 tablet po Q 6 hours prn pain  Continue Tylenol 650 mg po QID scheduled for pain  Follow up with Orthopedist as instructed  Family/ staff Communication:   Total Time:  Documentation:  Face to Face:  Family/Phone:   Labs/tests ordered:    Medication list reviewed and assessed for continued appropriateness. Monthly medication orders reviewed and signed.  Vikki Ports, NP-C Geriatrics [facility] [phone], [place] [id number] Cell Phone (Mon-Fri 8am-5pm):  [phone] On Call:  [phone] & follow prompts after 5pm & weekends Office Phone:  [phone] Office Fax:  [phone]

## 2017-08-31 ENCOUNTER — Encounter: Payer: Self-pay | Admitting: Gerontology

## 2017-08-31 ENCOUNTER — Non-Acute Institutional Stay (SKILLED_NURSING_FACILITY): Payer: Medicare Other | Admitting: Gerontology

## 2017-08-31 DIAGNOSIS — K5900 Constipation, unspecified: Secondary | ICD-10-CM

## 2017-08-31 DIAGNOSIS — Z96651 Presence of right artificial knee joint: Secondary | ICD-10-CM | POA: Diagnosis not present

## 2017-08-31 DIAGNOSIS — M1711 Unilateral primary osteoarthritis, right knee: Secondary | ICD-10-CM | POA: Diagnosis not present

## 2017-08-31 NOTE — Progress Notes (Signed)
Location:   [facility] Room Number: 209B Place of Service:  SNF [phone])  Provider: Toni Arthurs, NP-C  PCP: Crecencio Mc, MD Patient Care Team: Crecencio Mc, MD as PCP - General (Internal Medicine) Jackolyn Confer, MD (Internal Medicine)  Extended Emergency Contact Information Primary Emergency Contact: Morris,Kay Address: [address]          [place], [place] Phone: [phone] Mobile Phone: [phone] Relation: Mother Secondary Emergency Contact: Karren Cobble [place] Phone: [phone] Relation: Father  Code Status: FULL Goals of care:  Advanced Directive information Advanced Directives 08/31/2017  Does Patient Have a Medical Advance Directive? No  Would patient like information on creating a medical advance directive? -     Allergies  Allergen Reactions  . Erythromycin Shortness Of Breath, Rash and Other (See Comments)    Reaction:  Unknown    . Oxycontin [Oxycodone]   . Albuterol Rash and Other (See Comments)    "Jittery"     Chief Complaint  Patient presents with  . Discharge Note    Discharged from SNF    HPI:  47 y.o. female seen today for discharge evaluation.  Patient was admitted to the facility for rehab following right total knee replacement for primary osteoarthritis of the right knee [facility].  Patient is a resident at a group home.  Patient has Down's syndrome.  Patient had her left knee replaced several months ago and is doing well with this.  Patient is been participating in PT/OT.  Patient has been progressing well.  Patient reports her pain is well controlled on current regimen.  Patient reports her appetite is good, except for today.  She reports she has a poor appetite and is just "picking" at her meals.  She reports some nausea.  Her caregiver reports patient exhibits nausea with constipation.  Patient is unable to verbalize when her last bowel movement  was.  However, computer documentation reports she had a large bowel movement 3 days ago.  Of note, patient's roommate is actively dying.  I am concerned this is causing the patient distress.  However, will treat the nausea as a result of constipation based on the caregivers report.  Patient will be discharging tomorrow back to the group home, otherwise, patient or roommate would have been moved.  Patient is voiding well.  Vital signs stable.  No other complaints.    Past Medical History:  Diagnosis Date  . Arthritis   . Asthma    unspecified  . Blood clot in vein   . Chicken pox   . Colitis   . COPD (chronic obstructive pulmonary disease) ([place])   . Deafness in left ear   . Disease of thyroid gland   . Down syndrome   . DVT (deep venous thrombosis) ([place])   . Fractures   . GERD (gastroesophageal reflux disease)   . Hypothyroidism    unspecified  . 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 Positive anti-CCP antibodies, negative rheumatoid factor, positive FANA. b. Methotrexate, Prednisone, Plaquenil.   . Joint pain   . Lupus ([place]) 1995  . Osteoarthritis 01/30/2014   a. Lumbar disc disease. b. Trigger nodules  . Osteoporosis 01/30/2014   a. [place]  . RA (rheumatoid arthritis) ([place])   . Shingles   . 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 Ulcerative colitis [facility])     Past Surgical History:  Procedure Laterality Date  . ABDOMINAL HYSTERECTOMY    .  ABDOMINAL HYSTERECTOMY W/ PARTIAL [place]  . BUNIONECTOMY Bilateral    Bunionectomy great toe  . COLONOSCOPY  04/16/2013   Hx of ulcerative colitis - repeat 2 years per Dr. Rayann Heman  . COLONOSCOPY  09/24/2003   Dr. Brita Romp  . FOOT SURGERY Bilateral    x2  . HALLUX VALGUS CORRECTION    . KNEE ARTHROPLASTY Left 01/10/2017   Procedure: COMPUTER ASSISTED TOTAL KNEE ARTHROPLASTY;  Surgeon: Dereck Leep, MD;  Location: [facility] ORS;  Service: Orthopedics;  Laterality: Left;  . KNEE ARTHROPLASTY Right 08/10/2017    Procedure: COMPUTER ASSISTED TOTAL KNEE ARTHROPLASTY;  Surgeon: Dereck Leep, MD;  Location: [facility] ORS;  Service: Orthopedics;  Laterality: Right;      reports that she has never smoked. She has never used smokeless tobacco. She reports that she does not drink alcohol or use drugs. Social History   Socioeconomic History  . Marital status: Single    Spouse name: Not on file  . Number of children: 0  . Years of education: Not on file  . Highest education level: Not on file  Occupational History  . Not on file  Social Needs  . Financial resource strain: Not on file  . Food insecurity:    Worry: Not on file    Inability: Not on file  . Transportation needs:    Medical: Not on file    Non-medical: Not on file  Tobacco Use  . Smoking status: Never Smoker  . Smokeless tobacco: Never Used  Substance and Sexual Activity  . Alcohol use: No  . Drug use: No  . Sexual activity: Not on file  Lifestyle  . Physical activity:    Days per week: Not on file    Minutes per session: Not on file  . Stress: Not on file  Relationships  . Social connections:    Talks on phone: Not on file    Gets together: Not on file    Attends religious service: Not on file    Active member of club or organization: Not on file    Attends meetings of clubs or organizations: Not on file    Relationship status: Not on file  . Intimate partner violence:    Fear of current or ex partner: Not on file    Emotionally abused: Not on file    Physically abused: Not on file    Forced sexual activity: Not on file  Other Topics Concern  . Not on file  Social History Narrative   Lives at [facility] group home.   Functional Status Survey:    Allergies  Allergen Reactions  . Erythromycin Shortness Of Breath, Rash and Other (See Comments)    Reaction:  Unknown    . Oxycontin [Oxycodone]   . Albuterol Rash and Other (See Comments)    "Jittery"     Pertinent  Health Maintenance Due  Topic  Date Due  . MAMMOGRAM  08/19/2017  . INFLUENZA VACCINE  12/29/2017    Medications: Allergies as of 08/31/2017      Reactions   Erythromycin Shortness Of Breath, Rash, Other (See Comments)   Reaction:  Unknown    Oxycontin [oxycodone]    Albuterol Rash, Other (See Comments)   "Jittery"      Medication List        Accurate as of 08/31/17  2:45 PM. Always use your most recent med list.          acetaminophen 325 MG  tablet Commonly known as:  TYLENOL Take 2 tablets (650 mg total) by mouth 4 (four) times daily.   alendronate 70 MG tablet Commonly known as:  FOSAMAX Take 1 tablet (70 mg total) by mouth once a week. Take with a full glass of water on an empty stomach. On Wednesday   bisacodyl 5 MG EC tablet Commonly known as:  DULCOLAX Take 1 tablet (5 mg total) by mouth at bedtime.   budesonide-formoterol 160-4.5 MCG/ACT inhaler Commonly known as:  SYMBICORT Inhale 2 puffs into the lungs 2 (two) times daily.   calcium-vitamin D 500-200 MG-UNIT tablet Commonly known as:  OSCAL WITH D Take 1 tablet by mouth daily with breakfast.   cholecalciferol 1000 units tablet Commonly known as:  VITAMIN D Take 1 tablet (1,000 Units total) by mouth daily.   cyanocobalamin 1000 MCG/ML injection Commonly known as:  (VITAMIN B-12) Inject 1,000 mcg into the muscle every 30 (thirty) days. 30th of the Month   docusate sodium 100 MG capsule Commonly known as:  COLACE Take 1 capsule (100 mg total) by mouth every other day.   lactulose 10 GM/15ML solution Commonly known as:  CHRONULAC Take 30 g by mouth 2 (two) times daily as needed.   levalbuterol 0.63 MG/3ML nebulizer solution Commonly known as:  XOPENEX Take 0.63 mg by nebulization 3 (three) times daily as needed for wheezing or shortness of breath.   levothyroxine 125 MCG tablet Commonly known as:  SYNTHROID, LEVOTHROID Take 1 tablet (125 mcg total) by mouth daily before breakfast.   magnesium hydroxide 400 MG/5ML  suspension Commonly known as:  MILK OF MAGNESIA Take 30 mLs by mouth every 4 (four) hours as needed for mild constipation.   methotrexate 2.5 MG tablet Commonly known as:  RHEUMATREX Take 17.5 mg by mouth once a week. 7 tablet ,  on Fridays  Caution:Chemotherapy. Protect from light.   montelukast 10 MG tablet Commonly known as:  SINGULAIR Take 1 tablet (10 mg total) by mouth at bedtime.   omeprazole 20 MG capsule Commonly known as:  PRILOSEC Take 1 capsule (20 mg total) by mouth 2 (two) times daily.   ondansetron 4 MG tablet Commonly known as:  ZOFRAN Take 1 tablet (4 mg total) by mouth every 8 (eight) hours as needed for nausea or vomiting.   PATANOL 0.1 % ophthalmic solution Generic drug:  olopatadine Place 1 drop into both eyes daily.   predniSONE 2.5 MG tablet Commonly known as:  DELTASONE Take 1 tablet (2.5 mg total) by mouth daily with breakfast.   scopolamine 1 MG/3DAYS Commonly known as:  TRANSDERM-SCOP Place 1 patch onto the skin every 3 (three) days.   senna 8.6 MG Tabs tablet Commonly known as:  SENOKOT Take 2 tablets by mouth 2 (two) times daily.   SYRINGE 3CC/25GX1" 25G X 1" 3 ML Misc Use for b12 injections   THEREMS-M Tabs TAKE ONE TABLET BY MOUTH EVERY DAY   traMADol 50 MG tablet Commonly known as:  ULTRAM Take 50 mg by mouth every 6 (six) hours as needed for moderate pain.       Review of Systems  Unable to perform ROS: Other  Constitutional: Negative for activity change, appetite change, chills, diaphoresis and fever.  HENT: Negative for congestion, mouth sores, nosebleeds, postnasal drip, sneezing, sore throat, trouble swallowing and voice change.   Respiratory: Negative for apnea, cough, choking, chest tightness, shortness of breath and wheezing.   Cardiovascular: Negative for chest pain, palpitations and leg swelling.  Gastrointestinal: Negative for  abdominal distention, abdominal pain, constipation, diarrhea and nausea.  Genitourinary:  Negative for difficulty urinating, dysuria, frequency and urgency.  Musculoskeletal: Positive for arthralgias (typical arthritis) and gait problem. Negative for back pain and myalgias.  Skin: Negative for color change, pallor, rash and wound.  Neurological: Negative for dizziness, tremors, syncope, speech difficulty, weakness, numbness and headaches.  Psychiatric/Behavioral: Negative for agitation and behavioral problems.  All other systems reviewed and are negative.   Vitals:   08/31/17 1423  BP: 104/77  Pulse: 69  Resp: 20  Temp: 98.9 F (37.2 C)  TempSrc: Oral  SpO2: 99%  Weight: 170 lb 4.8 oz (77.2 kg)  Height: 4' 9"  (1.448 m)   Body mass index is 36.85 kg/m. Physical Exam  Constitutional: She is oriented to person, place, and time. Vital signs are normal. She appears well-developed and well-nourished. She is active and cooperative. She does not appear ill. No distress.  HENT:  Head: Normocephalic and atraumatic.  Mouth/Throat: Uvula is midline, oropharynx is clear and moist and mucous membranes are normal. Mucous membranes are not pale, not dry and not cyanotic.  Eyes: Pupils are equal, round, and reactive to light. Conjunctivae, EOM and lids are normal.  Neck: Trachea normal, normal range of motion and full passive range of motion without pain. Neck supple. No JVD present. No tracheal deviation, no edema and no erythema present. No thyromegaly present.  Cardiovascular: Normal rate, regular rhythm, normal heart sounds, intact distal pulses and normal pulses. Exam reveals no gallop, no distant heart sounds and no friction rub.  No murmur heard. Pulses:      Dorsalis pedis pulses are 2+ on the right side, and 2+ on the left side.  No edema  Pulmonary/Chest: Effort normal and breath sounds normal. No accessory muscle usage. No respiratory distress. She has no decreased breath sounds. She has no wheezes. She has no rhonchi. She has no rales. She exhibits no tenderness.  Abdominal:  Soft. Normal appearance and bowel sounds are normal. She exhibits no distension and no ascites. There is no tenderness.  Musculoskeletal: She exhibits no edema.       Right knee: She exhibits decreased range of motion and laceration. Tenderness found.  Expected osteoarthritis, stiffness; Bilateral Calves soft, supple. Negative Homan's Sign. B- pedal pulses equal  Neurological: She is alert and oriented to person, place, and time. She has normal strength. Gait abnormal.  Down syndrome  Skin: Skin is warm and dry. Laceration (right knee) noted. She is not diaphoretic. No cyanosis. No pallor. Nails show no clubbing.  Psychiatric: She has a normal mood and affect. Her speech is normal and behavior is normal. Judgment and thought content normal. Cognition and memory are normal.  Nursing note and vitals reviewed.   Labs reviewed: Basic Metabolic Panel: Recent Labs    03/15/17 1347 06/06/17 1035 07/28/17 1019  NA 141 140 142  K 4.3 4.1 3.7  CL 105 101 105  CO2 28 31 29   GLUCOSE 115* 60* 93  BUN 16 18 11   CREATININE 0.93 0.81 0.82  CALCIUM 8.9  9.0 9.2 8.7*   Liver Function Tests: Recent Labs    01/05/17 1226 06/06/17 1035 07/28/17 1019  AST 27 35 29  ALT 28 24 14   ALKPHOS 87 61 65  BILITOT 0.4 0.4 0.6  PROT 7.4 6.9 7.1  ALBUMIN 4.3 3.9 3.7   No results for input(s): LIPASE, AMYLASE in the last 8760 hours. No results for input(s): AMMONIA in the last 8760 hours. CBC: Recent Labs  12/31/16 1755 01/05/17 1226  01/12/17 0407 06/06/17 1035 07/28/17 1019  WBC 8.1 9.2   < > 9.3 5.9 5.5  NEUTROABS 7.7* 7.5  --   --  4.4  --   HGB 12.8 14.0   < > 10.6* 13.8 12.8  HCT 37.0 43.2   < > 31.4* 42.1 38.2  MCV 105.6* 107.8*   < > 103.1* 100.9* 102.5*  PLT 296 410.0*   < > 306 325.0 347   < > = values in this interval not displayed.   Cardiac Enzymes: Recent Labs    12/31/16 1755  TROPONINI <0.03   BNP: Invalid input(s): POCBNP CBG: No results for input(s): GLUCAP in the  last 8760 hours.  Procedures and Imaging Studies During Stay: Dg Knee Right Port  Result Date: 08/10/2017 CLINICAL DATA:  Status post right knee replacement EXAM: PORTABLE RIGHT KNEE - 1-2 VIEW COMPARISON:  None. FINDINGS: Right knee prosthesis is noted. Surgical drain is seen in place. No acute abnormality is noted. IMPRESSION: Status post right knee replacement. Electronically Signed   By: Inez Catalina M.D.   On: 08/10/2017 15:37    Assessment/Plan:    Primary osteoarthritis of right knee Status post total right knee replacement  Continue PT/OT  Continue exercises taught by PT/OT  Continue ice pack to the knee as needed for pain or swelling  Continue Ultram 50 mg p.o. every 6 hours as needed pain  Continue Tylenol 650 mg p.o. 4 times daily scheduled  Lovenox 30 mg subcu every 12 hours course for DVT prophylaxis has completed  Skin care per protocol  Follow-up with orthopedist as instructed  Constipation, unspecified constipation type  Give patient's home dose of as needed lactulose 30 g p.o. x1 now (1600)  Repeat lactulose 30 g p.o. at 2000.  Continue senna S2 tablets p.o. twice daily  Continue Dulcolax 5 mg p.o. nightly  Okay to give other as needed, protocol medications for constipation if patient does not have a BM by tomorrow morning  Monitor bowel habits  Patient is being discharged with the following home health services: OP PT through [facility]  Patient is being discharged with the following durable medical equipment: None needed  Patient has been advised to f/u with their PCP in 1-2 weeks to bring them up to date on their rehab stay.  Social services at facility was responsible for arranging this appointment.  Pt was provided with a 30 day supply of prescriptions for medications and refills must be obtained from their PCP.  For controlled substances, a more limited supply may be provided adequate until PCP appointment only.  Future labs/tests needed:     Family/ staff Communication:   Total Time:  Documentation:  Face to Face:  Family/Phone:  Vikki Ports, NP-C [facility] [address], [place] [address] Cell Phone (Mon-Fri 8am-5pm):  [phone] On Call:  [phone] & follow prompts after 5pm & weekends Office Phone:  [phone] Office Fax:  [phone]

## 2017-09-05 ENCOUNTER — Telehealth: Payer: Self-pay

## 2017-09-05 DIAGNOSIS — R262 Difficulty in walking, not elsewhere classified: Secondary | ICD-10-CM | POA: Diagnosis not present

## 2017-09-05 DIAGNOSIS — Z96651 Presence of right artificial knee joint: Secondary | ICD-10-CM | POA: Diagnosis not present

## 2017-09-05 DIAGNOSIS — M6281 Muscle weakness (generalized): Secondary | ICD-10-CM | POA: Diagnosis not present

## 2017-09-05 DIAGNOSIS — M25561 Pain in right knee: Secondary | ICD-10-CM | POA: Diagnosis not present

## 2017-09-05 NOTE — Telephone Encounter (Signed)
Please advise 

## 2017-09-05 NOTE — Telephone Encounter (Signed)
She hasn't been seen by anybody at [facility] in ten years.  I will need to see her first before  I make a referral

## 2017-09-05 NOTE — Telephone Encounter (Signed)
Copied from [place] [id number]. Topic: Referral - Request >> Sep 05, 2017 10:14 AM [name], NT wrote: Reason for CRM: patient mother [name] is calling and states pt has not seen her Gastroenterologists doctor in a few years and she recently has been having a lot of dry heiving. She would like a referral placed so she can go and see her Gastroenterologists doctor. She states she went to [facility] but is unaware of the doctor name. Please advise.

## 2017-09-06 NOTE — Telephone Encounter (Signed)
Attempted to call pt's mother no answer no voicemail. If mother calls back [facility] may speak with her.

## 2017-09-07 DIAGNOSIS — Z96651 Presence of right artificial knee joint: Secondary | ICD-10-CM | POA: Diagnosis not present

## 2017-09-07 DIAGNOSIS — M25561 Pain in right knee: Secondary | ICD-10-CM | POA: Diagnosis not present

## 2017-09-12 DIAGNOSIS — M25561 Pain in right knee: Secondary | ICD-10-CM | POA: Diagnosis not present

## 2017-09-12 DIAGNOSIS — Z96651 Presence of right artificial knee joint: Secondary | ICD-10-CM | POA: Diagnosis not present

## 2017-09-15 DIAGNOSIS — Z96651 Presence of right artificial knee joint: Secondary | ICD-10-CM | POA: Diagnosis not present

## 2017-09-15 DIAGNOSIS — M25561 Pain in right knee: Secondary | ICD-10-CM | POA: Diagnosis not present

## 2017-09-19 DIAGNOSIS — M25561 Pain in right knee: Secondary | ICD-10-CM | POA: Diagnosis not present

## 2017-09-19 DIAGNOSIS — Z96651 Presence of right artificial knee joint: Secondary | ICD-10-CM | POA: Diagnosis not present

## 2017-09-21 DIAGNOSIS — M25561 Pain in right knee: Secondary | ICD-10-CM | POA: Diagnosis not present

## 2017-09-21 DIAGNOSIS — Z96651 Presence of right artificial knee joint: Secondary | ICD-10-CM | POA: Diagnosis not present

## 2017-09-22 ENCOUNTER — Other Ambulatory Visit: Payer: Self-pay | Admitting: Internal Medicine

## 2017-09-22 DIAGNOSIS — Z1231 Encounter for screening mammogram for malignant neoplasm of breast: Secondary | ICD-10-CM

## 2017-09-22 DIAGNOSIS — Z96651 Presence of right artificial knee joint: Secondary | ICD-10-CM | POA: Diagnosis not present

## 2017-09-28 ENCOUNTER — Other Ambulatory Visit: Payer: Self-pay | Admitting: Internal Medicine

## 2017-09-28 NOTE — Telephone Encounter (Signed)
Fosamax      Refilled: 03/14/2017  Methotrexate       Refilled: 07/04/2017  Last OV: 06/22/2017 Next OV: 12/20/2017

## 2017-09-28 NOTE — Telephone Encounter (Signed)
I cannot refill the methotrexate,  bc it needs to come from her rheumatologist please let mother know

## 2017-10-04 NOTE — Telephone Encounter (Signed)
Spoke with pt's mother and informed her that they will need to contact the pt's rheumatologist to get her methotrexate refilled. Mother gave a verbal understanding.

## 2017-10-04 NOTE — Telephone Encounter (Signed)
Spoke with pt's mother and scheduled the pt for an appt on Friday with Dr. Derrel Nip. Mother is aware of appt date and time.

## 2017-10-07 ENCOUNTER — Encounter: Payer: Self-pay | Admitting: Internal Medicine

## 2017-10-07 ENCOUNTER — Ambulatory Visit (INDEPENDENT_AMBULATORY_CARE_PROVIDER_SITE_OTHER): Payer: Medicare Other | Admitting: Internal Medicine

## 2017-10-07 VITALS — BP 112/74 | HR 77 | Temp 98.5°F | Resp 15 | Ht <= 58 in | Wt 172.2 lb

## 2017-10-07 DIAGNOSIS — K5190 Ulcerative colitis, unspecified, without complications: Secondary | ICD-10-CM

## 2017-10-07 DIAGNOSIS — Z79899 Other long term (current) drug therapy: Secondary | ICD-10-CM | POA: Diagnosis not present

## 2017-10-07 DIAGNOSIS — M1990 Unspecified osteoarthritis, unspecified site: Secondary | ICD-10-CM | POA: Diagnosis not present

## 2017-10-07 DIAGNOSIS — K219 Gastro-esophageal reflux disease without esophagitis: Secondary | ICD-10-CM

## 2017-10-07 DIAGNOSIS — M05719 Rheumatoid arthritis with rheumatoid factor of unspecified shoulder without organ or systems involvement: Secondary | ICD-10-CM | POA: Diagnosis not present

## 2017-10-07 LAB — CBC WITH DIFFERENTIAL/PLATELET
BASOS ABS: 0 10*3/uL (ref 0.0–0.1)
Basophils Relative: 0.7 % (ref 0.0–3.0)
EOS PCT: 0.7 % (ref 0.0–5.0)
Eosinophils Absolute: 0 10*3/uL (ref 0.0–0.7)
HCT: 37.7 % (ref 36.0–46.0)
HEMOGLOBIN: 12.5 g/dL (ref 12.0–15.0)
LYMPHS PCT: 11.3 % — AB (ref 12.0–46.0)
Lymphs Abs: 0.8 10*3/uL (ref 0.7–4.0)
MCHC: 33.1 g/dL (ref 30.0–36.0)
MCV: 104 fl — AB (ref 78.0–100.0)
MONOS PCT: 5.8 % (ref 3.0–12.0)
Monocytes Absolute: 0.4 10*3/uL (ref 0.1–1.0)
Neutro Abs: 5.5 10*3/uL (ref 1.4–7.7)
Neutrophils Relative %: 81.5 % — ABNORMAL HIGH (ref 43.0–77.0)
Platelets: 402 10*3/uL — ABNORMAL HIGH (ref 150.0–400.0)
RBC: 3.63 Mil/uL — ABNORMAL LOW (ref 3.87–5.11)
RDW: 18.2 % — ABNORMAL HIGH (ref 11.5–15.5)
WBC: 6.8 10*3/uL (ref 4.0–10.5)

## 2017-10-07 LAB — COMPREHENSIVE METABOLIC PANEL
ALBUMIN: 3.8 g/dL (ref 3.5–5.2)
ALK PHOS: 71 U/L (ref 39–117)
ALT: 15 U/L (ref 0–35)
AST: 28 U/L (ref 0–37)
BILIRUBIN TOTAL: 0.3 mg/dL (ref 0.2–1.2)
BUN: 11 mg/dL (ref 6–23)
CO2: 31 mEq/L (ref 19–32)
Calcium: 9.2 mg/dL (ref 8.4–10.5)
Chloride: 104 mEq/L (ref 96–112)
Creatinine, Ser: 0.84 mg/dL (ref 0.40–1.20)
GFR: 76.55 mL/min (ref >60.00)
Glucose, Bld: 90 mg/dL (ref 70–99)
POTASSIUM: 4.4 meq/L (ref 3.5–5.1)
Sodium: 141 mEq/L (ref 135–145)
TOTAL PROTEIN: 7.5 g/dL (ref 6.0–8.3)

## 2017-10-07 LAB — SEDIMENTATION RATE: Sed Rate: 38 mm/hr — ABNORMAL HIGH (ref 0–20)

## 2017-10-07 LAB — C-REACTIVE PROTEIN: CRP: 2.7 mg/dL (ref 0.5–20.0)

## 2017-10-07 MED ORDER — PREDNISONE 10 MG PO TABS: ORAL_TABLET | ORAL | 0 refills | Status: DC

## 2017-10-07 MED ORDER — METHOTREXATE 2.5 MG PO TABS: 17.5000 mg | ORAL_TABLET | ORAL | 3 refills | Status: DC

## 2017-10-07 MED ORDER — KETOCONAZOLE 2 % EX CREA: 1.0000 "application " | TOPICAL_CREAM | Freq: Every day | CUTANEOUS | 2 refills | Status: DC

## 2017-10-07 MED ORDER — CLOTRIMAZOLE-BETAMETHASONE 1-0.05 % EX CREA: 1.0000 "application " | TOPICAL_CREAM | Freq: Two times a day (BID) | CUTANEOUS | 2 refills | Status: DC

## 2017-10-07 NOTE — Progress Notes (Signed)
Subjective:  Patient ID: Cindy Oconnor, female    DOB: 1971-03-19  Age: 47 y.o. MRN: [id number]  CC: The primary encounter diagnosis was Long-term use of high-risk medication. Diagnoses of Rheumatoid arthritis involving shoulder with positive rheumatoid factor, unspecified laterality ([place]), Chronic ulcerative colitis without complication, unspecified location ([place]), Gastroesophageal reflux disease without esophagitis, and Chronic inflammatory arthritis were also pertinent to this visit.  HPI Cindy Oconnor presents for evaluation of multiple issues. 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 Her mother has since discovered that the tramadol was causing the adverse reaction and she has discontinued the medication with resolution of the symptoms.  However she has a history of GERD managed with daily PPI and ulcerative colitis with last colonoscopy in 2014 by Dr Arther Dames at [facility].     RA:  She has not seen her  In over a year and has run out of methotrexate,  Last weekly doses last week.  She is having a flare of rheumatoid arthritis  In her hands  for the last several days rheumatologist (previous one was in [facility]). Her mother inquired about changing to a local rheumatologist for convenience.    Needs refill of foot cream that her podiatrist prescribed  As well as the cream for the recurrent yeast infectio under her breast    Outpatient Medications Prior to Visit  Medication Sig Dispense Refill  . acetaminophen (TYLENOL) 325 MG tablet Take 2 tablets (650 mg total) by mouth 4 (four) times daily. 30 tablet 5  . alendronate (FOSAMAX) 70 MG tablet 1 TAB PO WEEKLY EARLY AM BEFORE FOOD/MEDS W/WATER. DON'T LIE DOWN FOR30 MIN (OSTEOPOROSIS) *NO CRUSH* 4 tablet 5  . bisacodyl  (DULCOLAX) 5 MG EC tablet Take 1 tablet (5 mg total) by mouth at bedtime. 90 tablet 3  . budesonide-formoterol (SYMBICORT) 160-4.5 MCG/ACT inhaler Inhale 2 puffs into the lungs 2 (two) times daily.    . calcium-vitamin D (OSCAL WITH D) 500-200 MG-UNIT tablet Take 1 tablet by mouth daily with breakfast. 30 tablet 5  . cholecalciferol (VITAMIN D) 1000 units tablet Take 1 tablet (1,000 Units total) by mouth daily. 30 tablet 5  . cyanocobalamin (,VITAMIN B-12,) 1000 MCG/ML injection Inject 1,000 mcg into the muscle every 30 (thirty) days. 30th of the Month    . docusate sodium (COLACE) 100 MG capsule TAKE ONE CAPSULE BY MOUTH EVERY OTHER DAY 15 capsule 2  . lactulose (CHRONULAC) 10 GM/15ML solution Take 30 g by mouth 2 (two) times daily as needed.    . levalbuterol (XOPENEX) 0.63 MG/3ML nebulizer solution Take 0.63 mg by nebulization 3 (three) times daily as needed for wheezing or shortness of breath.    . levothyroxine (SYNTHROID, LEVOTHROID) 125 MCG tablet Take 1 tablet (125 mcg total) by mouth daily before breakfast. 30 tablet 10  . magnesium hydroxide (MILK OF MAGNESIA) 400 MG/5ML suspension Take 30 mLs by mouth every 4 (four) hours as needed for mild constipation.    . montelukast (SINGULAIR) 10 MG tablet Take 1 tablet (10 mg total) by mouth at bedtime. 30 tablet 3  . Multiple Vitamins-Minerals (THEREMS-M) TABS TAKE ONE TABLET BY MOUTH EVERY DAY 35 tablet 5  . olopatadine (PATANOL) 0.1 % ophthalmic solution Place 1 drop into both eyes daily.    Marland Kitchen  omeprazole (PRILOSEC) 20 MG capsule Take 1 capsule (20 mg total) by mouth 2 (two) times daily. 60 capsule 5  . ondansetron (ZOFRAN) 4 MG tablet Take 1 tablet (4 mg total) by mouth every 8 (eight) hours as needed for nausea or vomiting. 20 tablet 5  . predniSONE (DELTASONE) 2.5 MG tablet Take 1 tablet (2.5 mg total) by mouth daily with breakfast. 90 tablet 2  . scopolamine (TRANSDERM-SCOP) 1 MG/3DAYS Place 1 patch onto the skin every 3 (three) days.    Marland Kitchen  senna (SENOKOT) 8.6 MG TABS tablet Take 2 tablets by mouth 2 (two) times daily.    . Syringe/Needle, Disp, (SYRINGE 3CC/25GX1") 25G X 1" 3 ML MISC Use for b12 injections 50 each 0  . traMADol (ULTRAM) 50 MG tablet Take 50 mg by mouth every 6 (six) hours as needed for moderate pain.    . methotrexate (RHEUMATREX) 2.5 MG tablet Take 17.5 mg by mouth once a week. 7 tablet ,  on Fridays  Caution:Chemotherapy. Protect from light.     No facility-administered medications prior to visit.     Review of Systems;  Patient denies headache, fevers, malaise, unintentional weight loss, skin rash, eye pain, sinus congestion and sinus pain, sore throat, dysphagia,  hemoptysis , cough, dyspnea, wheezing, chest pain, palpitations, orthopnea, edema, abdominal pain, nausea, melena, diarrhea, constipation, flank pain, dysuria, hematuria, urinary  Frequency, nocturia, numbness, tingling, seizures,  Focal weakness, Loss of consciousness,  Tremor, insomnia, depression, anxiety, and suicidal ideation.      Objective:  BP 112/74 (BP Location: Right Arm, Patient Position: Sitting, Cuff Size: Normal)   Pulse 77   Temp 98.5 F (36.9 C) (Oral)   Resp 15   Ht 4' 9"  (1.448 m)   Wt 172 lb 3.2 oz (78.1 kg)   LMP 04/11/1981   SpO2 99%   BMI 37.26 kg/m   BP Readings from Last 3 Encounters:  10/07/17 112/74  08/31/17 104/77  08/26/17 93/60    Wt Readings from Last 3 Encounters:  10/07/17 172 lb 3.2 oz (78.1 kg)  08/31/17 170 lb 4.8 oz (77.2 kg)  08/10/17 173 lb 1 oz (78.5 kg)    General appearance: alert, cooperative and appears stated age Ears: normal TM's and external ear canals both ears Throat: lips, mucosa, and tongue normal; teeth and gums normal Neck: no adenopathy, no carotid bruit, supple, symmetrical, trachea midline and thyroid not enlarged, symmetric, no tenderness/mass/nodules Back: symmetric, no curvature. ROM normal. No CVA tenderness. Lungs: clear to auscultation bilaterally Heart: regular  rate and rhythm, S1, S2 normal, no murmur, click, rub or gallop Abdomen: soft, non-tender; bowel sounds normal; no masses,  no organomegaly Pulses: 2+ and symmetric MSK bilateral synovitis of MCPs .  Knee incision well healed,  No edema, redness or warmth Skin: Skin color, texture, turgor normal. No rashes or lesions Lymph nodes: Cervical, supraclavicular, and axillary nodes normal.  Lab Results  Component Value Date   HGBA1C 5.7 06/27/2013    Lab Results  Component Value Date   CREATININE 0.84 10/07/2017   CREATININE 0.82 07/28/2017   CREATININE 0.81 06/06/2017    Lab Results  Component Value Date   WBC 6.8 10/07/2017   HGB 12.5 10/07/2017   HCT 37.7 10/07/2017   PLT 402.0 (H) 10/07/2017   GLUCOSE 90 10/07/2017   CHOL 174 05/19/2015   TRIG 97.0 05/19/2015   HDL 47.50 05/19/2015   LDLCALC 107 (H) 05/19/2015   ALT 15 10/07/2017   AST 28 10/07/2017   NA  141 10/07/2017   K 4.4 10/07/2017   CL 104 10/07/2017   CREATININE 0.84 10/07/2017   BUN 11 10/07/2017   CO2 31 10/07/2017   TSH 4.40 06/07/2016   INR 0.95 07/28/2017   HGBA1C 5.7 06/27/2013    No results found.  Assessment & Plan:   Problem List Items Addressed This Visit    Rheumatoid arthritis ([place])   Relevant Medications   methotrexate (RHEUMATREX) 2.5 MG tablet   predniSONE (DELTASONE) 10 MG tablet   Other Relevant Orders   Sedimentation rate (Completed)   C-reactive protein (Completed)   Ulcerative colitis, chronic ([place])    She is overdue for colonoscopy /GI follow up.  Referring to [facility] gastroenterology      Long-term use of high-risk medication - Primary    CBC CMEt normal on MTX.  Refills given  Lab Results  Component Value Date   WBC 6.8 10/07/2017   HGB 12.5 10/07/2017   HCT 37.7 10/07/2017   MCV 104.0 (H) 10/07/2017   PLT 402.0 (H) 10/07/2017   Lab Results  Component Value Date   ALT 15 10/07/2017   AST 28 10/07/2017   ALKPHOS 71 10/07/2017   BILITOT 0.3 10/07/2017   Lab  Results  Component Value Date   CREATININE 0.84 10/07/2017         Relevant Orders   CBC with Differential/Platelet (Completed)   Comprehensive metabolic panel (Completed)   GERD    Managed with daily PPI,  EGD recommended. Gi referral in progress      Chronic inflammatory arthritis    Refilling MTX.  Surveillance labs today.  Adding prednisone taper for flare of synivitis. Follow up with [facility] Rheumatology  Lab Results  Component Value Date   ESRSEDRATE 38 (H) 10/07/2017         Relevant Medications   methotrexate (RHEUMATREX) 2.5 MG tablet   predniSONE (DELTASONE) 10 MG tablet      I have changed Devota L. Severe's methotrexate. I am also having her start on predniSONE. Additionally, I am having her maintain her montelukast, omeprazole, cholecalciferol, calcium-vitamin D, levothyroxine, bisacodyl, predniSONE, ondansetron, acetaminophen, THEREMS-M, SYRINGE 3CC/25GX1", olopatadine, budesonide-formoterol, levalbuterol, cyanocobalamin, lactulose, magnesium hydroxide, senna, traMADol, scopolamine, docusate sodium, alendronate, clotrimazole-betamethasone, and ketoconazole.  Meds ordered this encounter  Medications  . methotrexate (RHEUMATREX) 2.5 MG tablet    Sig: Take 7 tablets (17.5 mg total) by mouth once a week. 7 tablet ,  on Fridays  Caution:Chemotherapy. Protect from light.    Dispense:  28 tablet    Refill:  3  . predniSONE (DELTASONE) 10 MG tablet    Sig: 6 tablets on Day 1 , then reduce by 1 tablet daily until gone    Dispense:  21 tablet    Refill:  0  . clotrimazole-betamethasone (LOTRISONE) cream    Sig: Apply 1 application topically 2 (two) times daily.    Dispense:  45 g    Refill:  2  . ketoconazole (NIZORAL) 2 % cream    Sig: Apply 1 application topically daily.    Dispense:  60 g    Refill:  2    Medications Discontinued During This Encounter  Medication Reason  . methotrexate (RHEUMATREX) 2.5 MG tablet Reorder    Follow-up: No follow-ups on  file.   Crecencio Mc, MD

## 2017-10-07 NOTE — Patient Instructions (Addendum)
Iran Sizer was your rheumatolgist at [place] your last visit was September 21 2016   Lotrison is the lotion Dr cline prescribed  for the feet (clotirimazole beta methasone )   Nizoral is for the breast infection  Referral to Dr [name] [facility] gastroenterology)  All meds were sent to the pharmacy on [address]

## 2017-10-09 ENCOUNTER — Encounter: Payer: Self-pay | Admitting: Internal Medicine

## 2017-10-09 DIAGNOSIS — Z79899 Other long term (current) drug therapy: Secondary | ICD-10-CM | POA: Insufficient documentation

## 2017-10-09 NOTE — Assessment & Plan Note (Addendum)
Refilling MTX.  Surveillance labs today.  Adding prednisone taper for flare of synivitis. Follow up with [facility]umat[facility]  Lab Results  Component Value Date   ESRSEDRATE 73 (H) 10/07/2017

## 2017-10-09 NOTE — Assessment & Plan Note (Signed)
She is overdue for colonoscopy /GI follow up.  Referring to [facility] gastroenterology

## 2017-10-09 NOTE — Assessment & Plan Note (Signed)
Managed with daily PPI,  EGD recommended. Gi referral in progress

## 2017-10-09 NOTE — Assessment & Plan Note (Signed)
CBC CMEt normal on MTX.  Refills given  Lab Results  Component Value Date   WBC 6.8 10/07/2017   HGB 12.5 10/07/2017   HCT 37.7 10/07/2017   MCV 104.0 (H) 10/07/2017   PLT 402.0 (H) 10/07/2017   Lab Results  Component Value Date   ALT 15 10/07/2017   AST 28 10/07/2017   ALKPHOS 71 10/07/2017   BILITOT 0.3 10/07/2017   Lab Results  Component Value Date   CREATININE 0.84 10/07/2017

## 2017-10-11 ENCOUNTER — Ambulatory Visit
Admission: RE | Admit: 2017-10-11 | Discharge: 2017-10-11 | Disposition: A | Discharge status: Home / Self Care | Payer: Medicare Other | Source: Ambulatory Visit | Attending: Internal Medicine | Admitting: Internal Medicine

## 2017-10-11 DIAGNOSIS — Z1231 Encounter for screening mammogram for malignant neoplasm of breast: Secondary | ICD-10-CM | POA: Insufficient documentation

## 2017-10-13 ENCOUNTER — Other Ambulatory Visit: Payer: Self-pay | Admitting: Internal Medicine

## 2017-10-13 MED ORDER — BIOFLEXOR 3 % EX GEL: CUTANEOUS | 11 refills | Status: DC

## 2017-10-13 NOTE — Progress Notes (Signed)
bioflex

## 2017-10-17 ENCOUNTER — Telehealth: Payer: Self-pay | Admitting: Internal Medicine

## 2017-10-17 ENCOUNTER — Telehealth: Payer: Self-pay

## 2017-10-17 NOTE — Telephone Encounter (Signed)
Ask mother if she would like to supply the [facility] or go with the substitute  That her pharmacy has

## 2017-10-17 NOTE — Telephone Encounter (Signed)
Copied from [place] [phone]. Topic: Quick Communication - See Telephone Encounter >> Oct 17, 2017  4:12 PM Ether Griffins B wrote: CRM for notification. See Telephone encounter for: 10/17/17.  Pharmacy ([place], [place] [employer]) unable to order the Liniments (BIOFLEXOR) 3 % GEL. They do have arctic relieve gel 3.5 % nature menthol.

## 2017-10-17 NOTE — Telephone Encounter (Signed)
Please advise 

## 2017-10-18 NOTE — Telephone Encounter (Signed)
Mother called back to state that alternate medication is okay to send to pharmacy.

## 2017-10-18 NOTE — Telephone Encounter (Signed)
artic relief is not  In Epic so I cannot order it.   Please call the patient's pharmacy and give a verbal order for artic relief 3.5% apply to painful joints twice daily

## 2017-10-18 NOTE — Telephone Encounter (Signed)
Incoming call from pt. Mother stating Artic Relief gel 3.5% is alright to use in place of Bioflex. Will make Dr. Derrel Nip aware.

## 2017-10-18 NOTE — Telephone Encounter (Signed)
LMTCB. PEC may speak with pt's mother, Kay(on DPR).

## 2017-10-20 ENCOUNTER — Telehealth: Payer: Self-pay

## 2017-10-20 NOTE — Telephone Encounter (Signed)
Copied from [place] [id number]. Topic: Inquiry >> Oct 20, 2017 11:50 AM Pricilla Handler wrote: Reason for CRM: Patient's caregiver called requesting a refill of Levalbuterol (XOPENEX) 0.63 MG/3ML nebulizer solution. Patient's preferred pharmacy is [facility], [place] [phone] (Phone)   [phone] (Fax).       Thank You!!!

## 2017-10-20 NOTE — Telephone Encounter (Signed)
Verbal given to pharmacy.

## 2017-10-28 NOTE — Telephone Encounter (Signed)
error 

## 2017-10-31 ENCOUNTER — Telehealth: Payer: Self-pay | Admitting: Internal Medicine

## 2017-10-31 NOTE — Telephone Encounter (Unsigned)
Copied from [place] [phone]. Topic: Appointment Scheduling - Scheduling Inquiry for Clinic >> Oct 31, 2017  4:39 PM Percell Belt A wrote: Reason for CRM: [facility] called in and stated pt has cold and cough and needs to be worked in wed?  Please advise   Best number [phone] Or [phone]

## 2017-11-01 DIAGNOSIS — M064 Inflammatory polyarthropathy: Secondary | ICD-10-CM | POA: Diagnosis not present

## 2017-11-01 DIAGNOSIS — R0981 Nasal congestion: Secondary | ICD-10-CM | POA: Diagnosis not present

## 2017-11-01 DIAGNOSIS — Z79899 Other long term (current) drug therapy: Secondary | ICD-10-CM | POA: Diagnosis not present

## 2017-11-01 DIAGNOSIS — M818 Other osteoporosis without current pathological fracture: Secondary | ICD-10-CM | POA: Diagnosis not present

## 2017-11-01 DIAGNOSIS — M19042 Primary osteoarthritis, left hand: Secondary | ICD-10-CM | POA: Diagnosis not present

## 2017-11-01 DIAGNOSIS — J45909 Unspecified asthma, uncomplicated: Secondary | ICD-10-CM | POA: Diagnosis not present

## 2017-11-01 DIAGNOSIS — Z7952 Long term (current) use of systemic steroids: Secondary | ICD-10-CM | POA: Diagnosis not present

## 2017-11-01 DIAGNOSIS — J3489 Other specified disorders of nose and nasal sinuses: Secondary | ICD-10-CM | POA: Diagnosis not present

## 2017-11-01 DIAGNOSIS — M25561 Pain in right knee: Secondary | ICD-10-CM | POA: Diagnosis not present

## 2017-11-01 DIAGNOSIS — Z86718 Personal history of other venous thrombosis and embolism: Secondary | ICD-10-CM | POA: Diagnosis not present

## 2017-11-01 DIAGNOSIS — M170 Bilateral primary osteoarthritis of knee: Secondary | ICD-10-CM | POA: Diagnosis not present

## 2017-11-01 DIAGNOSIS — M19041 Primary osteoarthritis, right hand: Secondary | ICD-10-CM | POA: Diagnosis not present

## 2017-11-01 DIAGNOSIS — M069 Rheumatoid arthritis, unspecified: Secondary | ICD-10-CM | POA: Diagnosis not present

## 2017-11-01 DIAGNOSIS — M1990 Unspecified osteoarthritis, unspecified site: Secondary | ICD-10-CM | POA: Diagnosis not present

## 2017-11-01 DIAGNOSIS — Q909 Down syndrome, unspecified: Secondary | ICD-10-CM | POA: Diagnosis not present

## 2017-11-01 NOTE — Telephone Encounter (Signed)
Called Cindy Oconnor at [facility] and left voicemail informing her to call the office back about getting patient scheduled for an appointment with our Nurse practioner today but informing her that the appointment available can be filled at any time.

## 2017-11-02 ENCOUNTER — Ambulatory Visit: Payer: Medicare Other | Admitting: Family Medicine

## 2017-11-02 ENCOUNTER — Telehealth: Payer: Self-pay | Admitting: Family Medicine

## 2017-11-02 NOTE — Telephone Encounter (Signed)
Copied from [place]. Topic: Quick Communication - See Telephone Encounter >> Nov 02, 2017 11:51 AM Aurelio Brash B wrote: CRM for notification. See Telephone encounter for: 11/02/17.  PTs mother would like a call from [place] to discuss the pts office visit and chest xray this morning  [phone]

## 2017-11-03 ENCOUNTER — Telehealth: Payer: Self-pay

## 2017-11-03 ENCOUNTER — Encounter: Payer: Self-pay | Admitting: Family

## 2017-11-03 ENCOUNTER — Telehealth: Payer: Self-pay | Admitting: Internal Medicine

## 2017-11-03 ENCOUNTER — Ambulatory Visit (INDEPENDENT_AMBULATORY_CARE_PROVIDER_SITE_OTHER): Payer: Medicare Other

## 2017-11-03 ENCOUNTER — Ambulatory Visit (INDEPENDENT_AMBULATORY_CARE_PROVIDER_SITE_OTHER): Payer: Medicare Other | Admitting: Family

## 2017-11-03 VITALS — BP 122/64 | HR 83 | Temp 98.2°F | Resp 16 | Wt 169.5 lb

## 2017-11-03 DIAGNOSIS — R059 Cough, unspecified: Secondary | ICD-10-CM

## 2017-11-03 DIAGNOSIS — J209 Acute bronchitis, unspecified: Secondary | ICD-10-CM | POA: Diagnosis not present

## 2017-11-03 DIAGNOSIS — R05 Cough: Secondary | ICD-10-CM | POA: Diagnosis not present

## 2017-11-03 MED ORDER — MOXIFLOXACIN HCL 400 MG PO TABS: 400.0000 mg | ORAL_TABLET | Freq: Every day | ORAL | 0 refills | Status: DC

## 2017-11-03 MED ORDER — PREDNISONE 10 MG PO TABS: ORAL_TABLET | ORAL | 0 refills | Status: DC

## 2017-11-03 MED ORDER — LEVOFLOXACIN 500 MG PO TABS: 500.0000 mg | ORAL_TABLET | Freq: Every day | ORAL | 0 refills | Status: DC

## 2017-11-03 MED ORDER — GUAIFENESIN ER 600 MG PO TB12
600.0000 mg | ORAL_TABLET | Freq: Two times a day (BID) | ORAL | 0 refills | Status: DC
Start: 2017-11-03 — End: 2018-06-09

## 2017-11-03 NOTE — Telephone Encounter (Signed)
Tried calling mom voicemail box full

## 2017-11-03 NOTE — Telephone Encounter (Signed)
rx sent for levaquin, please notify pharmacy that this is in place of avelox.

## 2017-11-03 NOTE — Telephone Encounter (Signed)
Pt's mom returned call, advised of the message below. Mom says that she was able to pay out of pocket for Avelox so she will have pt taking that medication. ALSO, mom is awaiting imaging results.

## 2017-11-03 NOTE — Telephone Encounter (Signed)
Copied from [place] [phone]. Topic: Quick Communication - See Telephone Encounter >> Nov 03, 2017  3:07 PM Antonieta Iba C wrote: CRM for notification. See Telephone encounter for: 11/03/17.  Pt's mom would like to know if provider is able to work her in next week. Mom says that she was told that pt has pneumonia and to follow up with PCP in 1 week.   Please advise.

## 2017-11-03 NOTE — Telephone Encounter (Signed)
Please contact pt's mother and let her know that Cindy Oconnor's chest x ray did not show pneumonia.  However given how badly she is feeling and how bad her lung exam sounded I would recommend that she start the avelox.  Please contact pharmacy and let them know to cancel levaquin rx.

## 2017-11-03 NOTE — Telephone Encounter (Signed)
Copied from [place]. Topic: Quick Communication - See Telephone Encounter >> Nov 02, 2017 11:51 AM Aurelio Brash B wrote: CRM for notification. See Telephone encounter for: 11/02/17.  PTs mother would like a call from [place] to discuss the pts office visit and chest xray this morning  [phone] >> Nov 03, 2017 10:36 AM Percell Belt A wrote: Caregiver called in and stated that pt has a ton of perfume in her room and they think that her cough may be due to all this perfume.  She stated it is an abnormal amount and very strong .  Just fyi

## 2017-11-03 NOTE — Telephone Encounter (Signed)
Copied from [place] [phone]. Topic: General - Other >> Nov 03, 2017 12:19 PM Carolyn Stare wrote:  Pt saw Lenna Sciara and was given the below med but insurance does not cover and is asking if there is something else the doctor want to change to or do the office want to do a PA  moxifloxacin (AVELOX) 400 MG tablet >> Nov 03, 2017 12:22 PM Carolyn Stare wrote:   Pharmacy would like a call back

## 2017-11-03 NOTE — Telephone Encounter (Signed)
FYI

## 2017-11-03 NOTE — Patient Instructions (Addendum)
Please complete chest xray prior to leaving.  Begin avelox today (antibiotic). Xopenex nebulizer every 6 hours while awake for the next 1 week. Begin prednisone taper today.  While on this taper stop the daily 2.24m dose. When prednisone is complete please restart the 2.556mdose. Add mucinex 60056mwice daily for 1 week for congestion. Call if new/worsening symptoms or if not improved in 3 days. Go to ER if severe shortness of breath occurs or if you develop fever >101.

## 2017-11-03 NOTE — Progress Notes (Signed)
Subjective:    Patient ID: Cindy Oconnor, female    DOB: 1970-12-16, 47 y.o.   MRN: [id number]  HPI  Ms. Cindy Oconnor is a 47 yr old female with downs syndrome who presents today with chief complaint of cough. She lives in a group home but is accompanied today by her mother who is also her legal guardian. Symptoms began 10 days ago.  She reports some associated yellow nasal drainage. Cough is productive of yellow phlegm.  Mother thinks that she had tactile temp on Tuesday.  Reports breathing feels "tight and wheezy." Reports her chest hurts when she coughs.      Review of Systems See HPI  Past Medical History:  Diagnosis Date  . Arthritis   . Asthma    unspecified  . Blood clot in vein   . Chicken pox   . Colitis   . COPD (chronic obstructive pulmonary disease) ([place])   . Deafness in left ear   . Disease of thyroid gland   . Down syndrome   . DVT (deep venous thrombosis) ([place])   . Fractures   . GERD (gastroesophageal reflux disease)   . Hypothyroidism    unspecified  . 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 Positive anti-CCP antibodies, negative rheumatoid factor, positive FANA. b. Methotrexate, Prednisone, Plaquenil.   . Joint pain   . Lupus ([place]) 1995  . Osteoarthritis 01/30/2014   a. Lumbar disc disease. b. Trigger nodules  . Osteoporosis 01/30/2014   a. Boniva  . RA (rheumatoid arthritis) ([place])   . Shingles   . Ulcerative (chronic) enterocolitis ([place])   . Ulcerative colitis ([place])      Social History   Socioeconomic History  . Marital status: Single    Spouse name: Not on file  . Number of children: 0  . Years of education: Not on file  . Highest education level: Not on file  Occupational History  . Not on file  Social Needs  . Financial resource strain: Not on file  . Food insecurity:    Worry: Not on file    Inability: Not on file  . Transportation needs:    Medical: Not on file    Non-medical: Not on file  Tobacco Use  . Smoking status: Never  Smoker  . Smokeless tobacco: Never Used  Substance and Sexual Activity  . Alcohol use: No  . Drug use: No  . Sexual activity: Not on file  Lifestyle  . Physical activity:    Days per week: Not on file    Minutes per session: Not on file  . Stress: Not on file  Relationships  . Social connections:    Talks on phone: Not on file    Gets together: Not on file    Attends religious service: Not on file    Active member of club or organization: Not on file    Attends meetings of clubs or organizations: Not on file    Relationship status: Not on file  . Intimate partner violence:    Fear of current or ex partner: Not on file    Emotionally abused: Not on file    Physically abused: Not on file    Forced sexual activity: Not on file  Other Topics Concern  . Not on file  Social History Narrative   Lives at [facility] group home.    Past Surgical History:  Procedure Laterality Date  . ABDOMINAL HYSTERECTOMY    . ABDOMINAL HYSTERECTOMY W/ PARTIAL VAGINACTOMY  1992  . BUNIONECTOMY Bilateral    Bunionectomy great toe  . COLONOSCOPY  04/16/2013   Hx of ulcerative colitis - repeat 2 years per Dr. Rayann Heman  . COLONOSCOPY  09/24/2003   Dr. Brita Romp  . FOOT SURGERY Bilateral    x2  . HALLUX VALGUS CORRECTION    . KNEE ARTHROPLASTY Left 01/10/2017   Procedure: COMPUTER ASSISTED TOTAL KNEE ARTHROPLASTY;  Surgeon: Dereck Leep, MD;  Location: [facility] ORS;  Service: Orthopedics;  Laterality: Left;  . KNEE ARTHROPLASTY Right 08/10/2017   Procedure: COMPUTER ASSISTED TOTAL KNEE ARTHROPLASTY;  Surgeon: Dereck Leep, MD;  Location: [facility] ORS;  Service: Orthopedics;  Laterality: Right;    Family History  Problem Relation Age of Onset  . Heart disease Mother   . Diabetes Mother   . Breast cancer Mother 70  . Heart disease Father   . Diabetes Father   . Alcohol abuse Maternal Grandmother   . Arthritis Maternal Grandmother   . Stroke Maternal Grandmother   . Diabetes  Maternal Grandmother   . Alcohol abuse Maternal Grandfather   . Arthritis Maternal Grandfather   . Stroke Maternal Grandfather   . Diabetes Maternal Grandfather     Allergies  Allergen Reactions  . Erythromycin Shortness Of Breath, Rash and Other (See Comments)    Reaction:  Unknown    . Oxycontin [Oxycodone]   . Tramadol Nausea And Vomiting  . Albuterol Rash and Other (See Comments)    "Jittery"     Current Outpatient Medications on File Prior to Visit  Medication Sig Dispense Refill  . acetaminophen (TYLENOL) 325 MG tablet Take 2 tablets (650 mg total) by mouth 4 (four) times daily. 30 tablet 5  . alendronate (FOSAMAX) 70 MG tablet 1 TAB PO WEEKLY EARLY AM BEFORE FOOD/MEDS W/WATER. DON'T LIE DOWN FOR30 MIN (OSTEOPOROSIS) *NO CRUSH* 4 tablet 5  . bisacodyl (DULCOLAX) 5 MG EC tablet Take 1 tablet (5 mg total) by mouth at bedtime. 90 tablet 3  . budesonide-formoterol (SYMBICORT) 160-4.5 MCG/ACT inhaler Inhale 2 puffs into the lungs 2 (two) times daily.    . calcium-vitamin D (OSCAL WITH D) 500-200 MG-UNIT tablet Take 1 tablet by mouth daily with breakfast. 30 tablet 5  . cholecalciferol (VITAMIN D) 1000 units tablet Take 1 tablet (1,000 Units total) by mouth daily. 30 tablet 5  . clotrimazole-betamethasone (LOTRISONE) cream Apply 1 application topically 2 (two) times daily. 45 g 2  . cyanocobalamin (,VITAMIN B-12,) 1000 MCG/ML injection Inject 1,000 mcg into the muscle every 30 (thirty) days. 30th of the Month    . docusate sodium (COLACE) 100 MG capsule TAKE ONE CAPSULE BY MOUTH EVERY OTHER DAY 15 capsule 2  . ketoconazole (NIZORAL) 2 % cream Apply 1 application topically daily. 60 g 2  . lactulose (CHRONULAC) 10 GM/15ML solution Take 30 g by mouth 2 (two) times daily as needed.    . levalbuterol (XOPENEX) 0.63 MG/3ML nebulizer solution Take 0.63 mg by nebulization 3 (three) times daily as needed for wheezing or shortness of breath.    . levothyroxine (SYNTHROID, LEVOTHROID) 125 MCG  tablet Take 1 tablet (125 mcg total) by mouth daily before breakfast. 30 tablet 10  . Liniments (BIOFLEXOR) 3 % GEL Apply to painful joints 2 to 3 times daily as needed 135 g 11  . magnesium hydroxide (MILK OF MAGNESIA) 400 MG/5ML suspension Take 30 mLs by mouth every 4 (four) hours as needed for mild constipation.    . methotrexate (RHEUMATREX) 2.5 MG tablet Take  7 tablets (17.5 mg total) by mouth once a week. 7 tablet ,  on Fridays  Caution:Chemotherapy. Protect from light. 28 tablet 3  . montelukast (SINGULAIR) 10 MG tablet Take 1 tablet (10 mg total) by mouth at bedtime. 30 tablet 3  . Multiple Vitamins-Minerals (THEREMS-M) TABS TAKE ONE TABLET BY MOUTH EVERY DAY 35 tablet 5  . olopatadine (PATANOL) 0.1 % ophthalmic solution Place 1 drop into both eyes daily.    Marland Kitchen omeprazole (PRILOSEC) 20 MG capsule Take 1 capsule (20 mg total) by mouth 2 (two) times daily. 60 capsule 5  . ondansetron (ZOFRAN) 4 MG tablet Take 1 tablet (4 mg total) by mouth every 8 (eight) hours as needed for nausea or vomiting. 20 tablet 5  . predniSONE (DELTASONE) 2.5 MG tablet Take 1 tablet (2.5 mg total) by mouth daily with breakfast. 90 tablet 2  . senna (SENOKOT) 8.6 MG TABS tablet Take 2 tablets by mouth 2 (two) times daily.    . Syringe/Needle, Disp, (SYRINGE 3CC/25GX1") 25G X 1" 3 ML MISC Use for b12 injections 50 each 0  . traMADol (ULTRAM) 50 MG tablet Take 50 mg by mouth every 6 (six) hours as needed for moderate pain.     No current facility-administered medications on file prior to visit.     LMP 04/11/1981       Objective:   Physical Exam  Constitutional: She appears well-developed and well-nourished.  Cardiovascular: Normal rate, regular rhythm and normal heart sounds.  No murmur heard. Pulmonary/Chest: Effort normal. No respiratory distress. She has wheezes in the left upper field, the left middle field and the left lower field.  Psychiatric: She has a normal mood and affect. Her behavior is normal.  Judgment and thought content normal.          Assessment & Plan:  Bronchitis with bronchospasm- Advised mother as follows and order form filled for group home:  Please complete chest xray prior to leaving (to rule out pneumonia) Begin avelox today (antibiotic). Xopenex nebulizer every 6 hours while awake for the next 1 week. Begin prednisone taper today.  While on this taper stop the daily 2.84m dose. When prednisone is complete please restart the 2.553mdose. Add mucinex 6004mwice daily for 1 week for congestion. Call if new/worsening symptoms or if not improved in 3 days. Go to ER if severe shortness of breath occurs or if you develop fever >101.

## 2017-11-03 NOTE — Telephone Encounter (Signed)
FYI- Patient's mother called in stating she needs an Rx for Mucinex in order for the facility her daughter resides at to administer.  Debbrah Alar aware and sending in script to pharmacy on file.

## 2017-11-03 NOTE — Telephone Encounter (Signed)
Spoke to mom this call was from [employer]  called stating that patient was coughing due to having too much perfume on. There is new workers at group home.  Patient was evaluated her at office today for cough. Spoke with mother about this and she is aware.

## 2017-11-03 NOTE — Telephone Encounter (Signed)
Patients mother advised of xrays results.   [facility] and cancelled levofloxacin.

## 2017-11-03 NOTE — Addendum Note (Signed)
Addended by: Debbrah Alar on: 11/03/2017 02:53 PM   Modules accepted: Orders

## 2017-11-03 NOTE — Telephone Encounter (Signed)
Spoke with Cindy Oconnor mother states she was able to get Avelox there was misunderstanding at pharmacy medication was only $20. She is wondering about results of chest xray. Please advise.

## 2017-11-03 NOTE — Telephone Encounter (Addendum)
Patient seen 11/03/17 by Lenna Sciara ,FNP to follow up 1 week for bronchitis  Awaiting chest xray results

## 2017-11-03 NOTE — Telephone Encounter (Signed)
rx has been sent 

## 2017-11-03 NOTE — Telephone Encounter (Signed)
Please advise 

## 2017-11-04 NOTE — Telephone Encounter (Signed)
Is it okay to schedule pt in a same day appt for a one week follow up on pneumonia?

## 2017-11-04 NOTE — Telephone Encounter (Signed)
Yes that's fine 

## 2017-11-07 NOTE — Telephone Encounter (Signed)
LMTCB. Please transfer pt's mother to our office.

## 2017-11-09 NOTE — Telephone Encounter (Signed)
Pt is scheduled for a one week follow up with Dr. Derrel Nip on 11/10/2017 at 12pm. Pt's mother is aware of appt date and time.

## 2017-11-10 ENCOUNTER — Telehealth: Payer: Self-pay

## 2017-11-10 ENCOUNTER — Ambulatory Visit: Payer: Medicare Other | Admitting: Podiatry

## 2017-11-10 ENCOUNTER — Ambulatory Visit (INDEPENDENT_AMBULATORY_CARE_PROVIDER_SITE_OTHER): Payer: Medicare Other | Admitting: Internal Medicine

## 2017-11-10 ENCOUNTER — Encounter: Payer: Self-pay | Admitting: Internal Medicine

## 2017-11-10 DIAGNOSIS — H1013 Acute atopic conjunctivitis, bilateral: Secondary | ICD-10-CM | POA: Diagnosis not present

## 2017-11-10 DIAGNOSIS — J4541 Moderate persistent asthma with (acute) exacerbation: Secondary | ICD-10-CM | POA: Diagnosis not present

## 2017-11-10 MED ORDER — AZELASTINE HCL 0.05 % OP SOLN: 1.0000 [drp] | Freq: Two times a day (BID) | OPHTHALMIC | 12 refills | Status: DC

## 2017-11-10 MED ORDER — PREDNISONE 10 MG PO TABS
ORAL_TABLET | ORAL | 0 refills | Status: DC
Start: 2017-11-10 — End: 2017-12-28

## 2017-11-10 MED ORDER — DIPHENHYDRAMINE HCL 25 MG PO CAPS: ORAL_CAPSULE | ORAL | 0 refills | Status: DC

## 2017-11-10 MED ORDER — BENZONATATE 200 MG PO CAPS: 200.0000 mg | ORAL_CAPSULE | Freq: Two times a day (BID) | ORAL | 0 refills | Status: DC | PRN

## 2017-11-10 NOTE — Patient Instructions (Addendum)
For the cough   Take 25 mg benadryl  30 minutes before bedtime  For ten days   benzonatate capsules every 8 hours for cough  For ten days   Repeat the prednisone taper because you are still wheezing     I am changing the eye drops to azelastine

## 2017-11-10 NOTE — Progress Notes (Signed)
Pre-visit discussion using our clinic review tool. No additional management support is needed unless otherwise documented below in the visit note.  

## 2017-11-10 NOTE — Progress Notes (Signed)
Subjective:  Patient ID: Cindy Oconnor, female    DOB: 1970-12-13  Age: 47 y.o. MRN: [id number]  CC: Diagnoses of Moderate persistent asthma with exacerbation and Allergic conjunctivitis, bilateral were pertinent to this visit.  HPI Cindy Oconnor presents for one week follow up on recent treatment for  Bronchitis with bronchospasm.  She is accompanied by a paid caregiver from the group home in which she lives.  Previous visit reviewed today .  evaluation included a chest ray which was clear on June 6 ,  And she was treated with Avelox, a prednisone taper, and xopenex nebs.  Patient states that she finished the antibiotic and prednisone taper.  She feels less dyspneic and her productive cough has improved, but she continues to have a non productive cough that is keeping her up  most of the night   Outpatient Medications Prior to Visit  Medication Sig Dispense Refill  . acetaminophen (TYLENOL) 325 MG tablet Take 2 tablets (650 mg total) by mouth 4 (four) times daily. 30 tablet 5  . alendronate (FOSAMAX) 70 MG tablet 1 TAB PO WEEKLY EARLY AM BEFORE FOOD/MEDS W/WATER. DON'T LIE DOWN FOR30 MIN (OSTEOPOROSIS) *NO CRUSH* 4 tablet 5  . bisacodyl (DULCOLAX) 5 MG EC tablet Take 1 tablet (5 mg total) by mouth at bedtime. 90 tablet 3  . budesonide-formoterol (SYMBICORT) 160-4.5 MCG/ACT inhaler Inhale 2 puffs into the lungs 2 (two) times daily.    . calcium-vitamin D (OSCAL WITH D) 500-200 MG-UNIT tablet Take 1 tablet by mouth daily with breakfast. 30 tablet 5  . cholecalciferol (VITAMIN D) 1000 units tablet Take 1 tablet (1,000 Units total) by mouth daily. 30 tablet 5  . clotrimazole-betamethasone (LOTRISONE) cream Apply 1 application topically 2 (two) times daily. 45 g 2  . cyanocobalamin (,VITAMIN B-12,) 1000 MCG/ML injection Inject 1,000 mcg into the muscle every 30 (thirty) days. 30th of the Month    . docusate sodium (COLACE) 100 MG capsule TAKE ONE CAPSULE BY MOUTH EVERY OTHER DAY 15 capsule  2  . guaiFENesin (MUCINEX) 600 MG 12 hr tablet Take 1 tablet (600 mg total) by mouth 2 (two) times daily. 14 tablet 0  . ketoconazole (NIZORAL) 2 % cream Apply 1 application topically daily. 60 g 2  . lactulose (CHRONULAC) 10 GM/15ML solution Take 30 g by mouth 2 (two) times daily as needed.    . levalbuterol (XOPENEX) 0.63 MG/3ML nebulizer solution Take 0.63 mg by nebulization 3 (three) times daily as needed for wheezing or shortness of breath.    . levofloxacin (LEVAQUIN) 500 MG tablet Take 1 tablet (500 mg total) by mouth daily. 7 tablet 0  . levothyroxine (SYNTHROID, LEVOTHROID) 125 MCG tablet Take 1 tablet (125 mcg total) by mouth daily before breakfast. 30 tablet 10  . Liniments (BIOFLEXOR) 3 % GEL Apply to painful joints 2 to 3 times daily as needed 135 g 11  . magnesium hydroxide (MILK OF MAGNESIA) 400 MG/5ML suspension Take 30 mLs by mouth every 4 (four) hours as needed for mild constipation.    . methotrexate (RHEUMATREX) 2.5 MG tablet Take 7 tablets (17.5 mg total) by mouth once a week. 7 tablet ,  on Fridays  Caution:Chemotherapy. Protect from light. 28 tablet 3  . montelukast (SINGULAIR) 10 MG tablet Take 1 tablet (10 mg total) by mouth at bedtime. 30 tablet 3  . Multiple Vitamins-Minerals (THEREMS-M) TABS TAKE ONE TABLET BY MOUTH EVERY DAY 35 tablet 5  . olopatadine (PATANOL) 0.1 % ophthalmic solution Place 1  drop into both eyes daily.    Marland Kitchen omeprazole (PRILOSEC) 20 MG capsule Take 1 capsule (20 mg total) by mouth 2 (two) times daily. 60 capsule 5  . ondansetron (ZOFRAN) 4 MG tablet Take 1 tablet (4 mg total) by mouth every 8 (eight) hours as needed for nausea or vomiting. 20 tablet 5  . predniSONE (DELTASONE) 10 MG tablet 4 tabs by mouth once daily for 2 days, then 3 tabs daily x 2 days, then 2 tabs daily x 2 days, then 1 tab daily x 2 days 20 tablet 0  . predniSONE (DELTASONE) 2.5 MG tablet Take 1 tablet (2.5 mg total) by mouth daily with breakfast. 90 tablet 2  . senna (SENOKOT) 8.6  MG TABS tablet Take 2 tablets by mouth 2 (two) times daily.    . Syringe/Needle, Disp, (SYRINGE 3CC/25GX1") 25G X 1" 3 ML MISC Use for b12 injections 50 each 0  . traMADol (ULTRAM) 50 MG tablet Take 50 mg by mouth every 6 (six) hours as needed for moderate pain.     No facility-administered medications prior to visit.     Review of Systems;  Patient denies headache, fevers, malaise, unintentional weight loss, skin rash, eye pain, sinus congestion and sinus pain, sore throat, dysphagia,  hemoptysis , chest pain, palpitations, orthopnea, edema, abdominal pain, nausea, melena, diarrhea, constipation, flank pain, dysuria, hematuria, urinary  Frequency, nocturia, numbness, tingling, seizures,  Focal weakness, Loss of consciousness,  Tremor, insomnia, depression, anxiety, and suicidal ideation.    Objective:  BP 130/76 (BP Location: Left Arm, Patient Position: Sitting, Cuff Size: Normal)   Pulse 78   Temp 98.1 F (36.7 C) (Oral)   Resp 20   Wt 168 lb 6 oz (76.4 kg)   LMP 04/11/1981   SpO2 91%   BMI 36.44 kg/m   BP Readings from Last 3 Encounters:  11/10/17 130/76  11/03/17 122/64  10/07/17 112/74    Wt Readings from Last 3 Encounters:  11/10/17 168 lb 6 oz (76.4 kg)  11/03/17 169 lb 8 oz (76.9 kg)  10/07/17 172 lb 3.2 oz (78.1 kg)    General appearance: alert, cooperative and appears stated age Ears: normal TM's and external ear canals both ears Throat: lips, mucosa, and tongue normal; teeth and gums normal Neck: no adenopathy, no carotid bruit, supple, symmetrical, trachea midline and thyroid not enlarged, symmetric, no tenderness/mass/nodules Back: symmetric, no curvature. ROM normal. No CVA tenderness. Lungs: good air movement,  Occasional wheezing bilaterally  Heart: regular rate and rhythm, S1, S2 normal, no murmur, click, rub or gallop Abdomen: soft, non-tender; bowel sounds normal; no masses,  no organomegaly Pulses: 2+ and symmetric Skin: Skin color, texture, turgor  normal. No rashes or lesions Lymph nodes: Cervical, supraclavicular, and axillary nodes normal.  Lab Results  Component Value Date   HGBA1C 5.7 06/27/2013    Lab Results  Component Value Date   CREATININE 0.84 10/07/2017   CREATININE 0.82 07/28/2017   CREATININE 0.81 06/06/2017    Lab Results  Component Value Date   WBC 6.8 10/07/2017   HGB 12.5 10/07/2017   HCT 37.7 10/07/2017   PLT 402.0 (H) 10/07/2017   GLUCOSE 90 10/07/2017   CHOL 174 05/19/2015   TRIG 97.0 05/19/2015   HDL 47.50 05/19/2015   LDLCALC 107 (H) 05/19/2015   ALT 15 10/07/2017   AST 28 10/07/2017   NA 141 10/07/2017   K 4.4 10/07/2017   CL 104 10/07/2017   CREATININE 0.84 10/07/2017   BUN 11 10/07/2017  CO2 31 10/07/2017   TSH 4.40 06/07/2016   INR 0.95 07/28/2017   HGBA1C 5.7 06/27/2013    Mm 3d Screen Breast Bilateral  Result Date: 10/11/2017 CLINICAL DATA:  Screening. EXAM: DIGITAL SCREENING BILATERAL MAMMOGRAM WITH TOMO AND CAD COMPARISON:  Previous exam(s). ACR Breast Density Category b: There are scattered areas of fibroglandular density. FINDINGS: There are no findings suspicious for malignancy. Images were processed with CAD. IMPRESSION: No mammographic evidence of malignancy. A result letter of this screening mammogram will be mailed directly to the patient. RECOMMENDATION: Screening mammogram in one year. (Code:SM-B-01Y) BI-RADS CATEGORY  1: Negative. Electronically Signed   By: Curlene Dolphin M.D.   On: 10/11/2017 12:37    Assessment & Plan:   Problem List Items Addressed This Visit    Asthma exacerbation    Since she is still wheezing lightly on exam,  Will repeat prednisone taper.  For cough  Adding benadryl and tessalon perles.       Relevant Medications   predniSONE (DELTASONE) 10 MG tablet   Allergic conjunctivitis, bilateral    patient needs alternative eye drop due to insurance issues.  Azelastine prescribed          I am having Cindy Oconnor start on diphenhydrAMINE,  benzonatate, predniSONE, and azelastine. I am also having her maintain her montelukast, omeprazole, cholecalciferol, calcium-vitamin D, levothyroxine, bisacodyl, predniSONE, ondansetron, acetaminophen, THEREMS-M, SYRINGE 3CC/25GX1", olopatadine, budesonide-formoterol, levalbuterol, cyanocobalamin, lactulose, magnesium hydroxide, senna, traMADol, docusate sodium, alendronate, methotrexate, clotrimazole-betamethasone, ketoconazole, BIOFLEXOR, predniSONE, guaiFENesin, and levofloxacin.  Meds ordered this encounter  Medications  . diphenhydrAMINE (BENADRYL ALLERGY) 25 mg capsule    Sig: 1 caspule 30 minutes before bedtime for 10 days    Dispense:  30 capsule    Refill:  0  . benzonatate (TESSALON) 200 MG capsule    Sig: Take 1 capsule (200 mg total) by mouth 2 (two) times daily as needed for cough.    Dispense:  20 capsule    Refill:  0  . predniSONE (DELTASONE) 10 MG tablet    Sig: 6 tablets on Day 1 , then reduce by 1 tablet daily until gone    Dispense:  21 tablet    Refill:  0  . azelastine (OPTIVAR) 0.05 % ophthalmic solution    Sig: Place 1 drop into both eyes 2 (two) times daily.    Dispense:  6 mL    Refill:  12   A total of 25 minutes of face to face time was spent with patient more than half of which was spent in counselling on the above mentioned issues.  There are no discontinued medications.  Follow-up: No follow-ups on file.   Crecencio Mc, MD

## 2017-11-10 NOTE — Telephone Encounter (Signed)
Copied from [place] [id number]. Topic: General - Other >> Nov 10, 2017  1:49 PM Valla Leaver wrote: Reason for CRM: Mom, [name], would like a call back from cma to find out how the patient appointment with Dr. Derrel Nip went today.

## 2017-11-11 NOTE — Telephone Encounter (Signed)
Spoke with pt's mother and explained to her what was stated on the pt's AVS from last office visit. Mother gave a verbal understanding.

## 2017-11-13 ENCOUNTER — Encounter: Payer: Self-pay | Admitting: Internal Medicine

## 2017-11-13 DIAGNOSIS — H1013 Acute atopic conjunctivitis, bilateral: Secondary | ICD-10-CM | POA: Insufficient documentation

## 2017-11-13 NOTE — Assessment & Plan Note (Signed)
patient needs alternative eye drop due to insurance issues.  Azelastine prescribed

## 2017-11-13 NOTE — Assessment & Plan Note (Signed)
Since she is still wheezing lightly on exam,  Will repeat prednisone taper.  For cough  Adding benadryl and tessalon perles.

## 2017-11-14 DIAGNOSIS — M79674 Pain in right toe(s): Secondary | ICD-10-CM | POA: Diagnosis not present

## 2017-11-14 DIAGNOSIS — B351 Tinea unguium: Secondary | ICD-10-CM | POA: Diagnosis not present

## 2017-11-14 DIAGNOSIS — M79675 Pain in left toe(s): Secondary | ICD-10-CM | POA: Diagnosis not present

## 2017-11-30 ENCOUNTER — Other Ambulatory Visit: Payer: Self-pay | Admitting: Internal Medicine

## 2017-12-02 NOTE — Telephone Encounter (Signed)
New pharmacy ok to fill prednisone.

## 2017-12-03 NOTE — Telephone Encounter (Signed)
Refills sent to [facility]

## 2017-12-05 ENCOUNTER — Other Ambulatory Visit: Payer: Self-pay | Admitting: Internal Medicine

## 2017-12-06 NOTE — Telephone Encounter (Signed)
Looking in the historical medication list it looks like it was last refilled by you on 06/22/2017. Is it ok to refill?   Last OV: 11/10/2017 Next OV: 12/20/2017

## 2017-12-09 ENCOUNTER — Other Ambulatory Visit: Payer: Self-pay | Admitting: Internal Medicine

## 2017-12-16 ENCOUNTER — Telehealth: Payer: Self-pay | Admitting: Internal Medicine

## 2017-12-16 NOTE — Telephone Encounter (Signed)
Copied from [place] [id number]. Topic: General - Other >> Dec 16, 2017 10:32 AM Gardiner Ramus wrote: Reason for CRM: olopatadine (PATANOL) 0.1 % ophthalmic solution azelastine (OPTIVAR) 0.05 % ophthalmic solution diane from [facility] is calling because she wanted to know which medication should the pt take. Please advise Cb#[phone] ex [phone]

## 2017-12-16 NOTE — Telephone Encounter (Signed)
Whichever one works better  They are both anthistamine eye drops.

## 2017-12-16 NOTE — Telephone Encounter (Signed)
[name] from [facility] is wanting to know which eye solution the pt should be using.

## 2017-12-19 NOTE — Telephone Encounter (Signed)
DC PATANOL ORDERED PRINTED

## 2017-12-19 NOTE — Telephone Encounter (Signed)
Printed, signed and faxed to number provided.

## 2017-12-19 NOTE — Telephone Encounter (Signed)
Spoke with Shaquita at [facility] and she is wanting to know if it would be okay to get an order to d/c the patanol 0.1%. D/c order can be faxed to [phone].

## 2017-12-19 NOTE — Telephone Encounter (Signed)
LMTCB with [facility] to give [place] a call back.

## 2017-12-20 ENCOUNTER — Ambulatory Visit: Payer: Medicare Other | Admitting: Internal Medicine

## 2017-12-23 ENCOUNTER — Telehealth: Payer: Self-pay

## 2017-12-23 DIAGNOSIS — H6123 Impacted cerumen, bilateral: Secondary | ICD-10-CM

## 2017-12-23 NOTE — Telephone Encounter (Signed)
Pt's mother would like an ENT referral to Dr. Tami Ribas for pt due to ear wax build up.

## 2017-12-23 NOTE — Telephone Encounter (Signed)
Copied from [place] [phone]. Topic: Referral - Request >> Dec 23, 2017  8:17 AM Alfredia Ferguson R wrote: Pts mom is calling and requesting a referral due to wax buildup. Pts mom is requesting for her to be seen by Dr.McQueen at [facility]

## 2017-12-26 ENCOUNTER — Other Ambulatory Visit: Payer: Self-pay | Admitting: Internal Medicine

## 2017-12-26 NOTE — Telephone Encounter (Addendum)
Referral to dr Tami Ribas made.  Since Cindy Oconnor is out of office until Tuesday,  Patient will not be notified until Wednesday

## 2017-12-26 NOTE — Addendum Note (Signed)
Addended by: Crecencio Mc on: 12/26/2017 11:33 AM   Modules accepted: Orders

## 2017-12-27 DIAGNOSIS — H02823 Cysts of right eye, unspecified eyelid: Secondary | ICD-10-CM | POA: Diagnosis not present

## 2017-12-27 DIAGNOSIS — D2330 Other benign neoplasm of skin of unspecified part of face: Secondary | ICD-10-CM | POA: Diagnosis not present

## 2017-12-27 NOTE — Telephone Encounter (Signed)
Spoke with pt's mom to let her know that the referral has been made and that she should be hearing something from them later this week or early next week. Mother gave a verbal understanding.

## 2017-12-28 ENCOUNTER — Encounter: Payer: Self-pay | Admitting: Internal Medicine

## 2017-12-28 ENCOUNTER — Ambulatory Visit (INDEPENDENT_AMBULATORY_CARE_PROVIDER_SITE_OTHER): Payer: Medicare Other | Admitting: Internal Medicine

## 2017-12-28 VITALS — BP 96/70 | HR 69 | Temp 98.0°F | Resp 15 | Ht <= 58 in | Wt 171.2 lb

## 2017-12-28 DIAGNOSIS — R300 Dysuria: Secondary | ICD-10-CM | POA: Diagnosis not present

## 2017-12-28 DIAGNOSIS — R5383 Other fatigue: Secondary | ICD-10-CM | POA: Diagnosis not present

## 2017-12-28 DIAGNOSIS — E559 Vitamin D deficiency, unspecified: Secondary | ICD-10-CM

## 2017-12-28 DIAGNOSIS — M810 Age-related osteoporosis without current pathological fracture: Secondary | ICD-10-CM | POA: Diagnosis not present

## 2017-12-28 DIAGNOSIS — E669 Obesity, unspecified: Secondary | ICD-10-CM | POA: Diagnosis not present

## 2017-12-28 DIAGNOSIS — E039 Hypothyroidism, unspecified: Secondary | ICD-10-CM

## 2017-12-28 LAB — COMPREHENSIVE METABOLIC PANEL
ALT: 14 U/L (ref 0–35)
AST: 25 U/L (ref 0–37)
Albumin: 3.7 g/dL (ref 3.5–5.2)
Alkaline Phosphatase: 63 U/L (ref 39–117)
BUN: 15 mg/dL (ref 6–23)
CALCIUM: 9.2 mg/dL (ref 8.4–10.5)
CHLORIDE: 105 meq/L (ref 96–112)
CO2: 30 meq/L (ref 19–32)
Creatinine, Ser: 0.85 mg/dL (ref 0.40–1.20)
GFR: 75.44 mL/min (ref >60.00)
Glucose, Bld: 100 mg/dL — ABNORMAL HIGH (ref 70–99)
POTASSIUM: 3.7 meq/L (ref 3.5–5.1)
Sodium: 141 mEq/L (ref 135–145)
Total Bilirubin: 0.3 mg/dL (ref 0.2–1.2)
Total Protein: 6.7 g/dL (ref 6.0–8.3)

## 2017-12-28 LAB — CBC WITH DIFFERENTIAL/PLATELET
BASOS ABS: 0.1 10*3/uL (ref 0.0–0.1)
Basophils Relative: 0.9 % (ref 0.0–3.0)
Eosinophils Absolute: 0.1 10*3/uL (ref 0.0–0.7)
Eosinophils Relative: 1.1 % (ref 0.0–5.0)
HCT: 38.5 % (ref 36.0–46.0)
Hemoglobin: 12.7 g/dL (ref 12.0–15.0)
LYMPHS ABS: 1 10*3/uL (ref 0.7–4.0)
Lymphocytes Relative: 14.9 % (ref 12.0–46.0)
MCHC: 32.9 g/dL (ref 30.0–36.0)
MCV: 104.1 fl — AB (ref 78.0–100.0)
Monocytes Absolute: 0.5 10*3/uL (ref 0.1–1.0)
Monocytes Relative: 7.4 % (ref 3.0–12.0)
NEUTROS ABS: 5.2 10*3/uL (ref 1.4–7.7)
NEUTROS PCT: 75.7 % (ref 43.0–77.0)
PLATELETS: 368 10*3/uL (ref 150.0–400.0)
RBC: 3.7 Mil/uL — ABNORMAL LOW (ref 3.87–5.11)
RDW: 19.9 % — ABNORMAL HIGH (ref 11.5–15.5)
WBC: 6.9 10*3/uL (ref 4.0–10.5)

## 2017-12-28 LAB — HEMOGLOBIN A1C: Hgb A1c MFr Bld: 5.5 % (ref 4.6–6.5)

## 2017-12-28 LAB — VITAMIN D 25 HYDROXY (VIT D DEFICIENCY, FRACTURES): VITD: 33.5 ng/mL (ref 30.00–100.00)

## 2017-12-28 LAB — TSH: TSH: 2.93 u[IU]/mL (ref 0.35–4.50)

## 2017-12-28 NOTE — Patient Instructions (Addendum)
I have stopped the generic Dulcolax,  Because Cindy Oconnor is receiving Sennakot  If her constipation recurs,  We will add Miralax    Let me know if her home has an optional menu/diet with less carbs

## 2017-12-28 NOTE — Progress Notes (Signed)
Subjective:  Patient ID: Cindy Oconnor, female    DOB: 09-04-1970  Age: 47 y.o. MRN: [id number]  CC: The primary encounter diagnosis was Dysuria. Diagnoses of Acquired hypothyroidism, Vitamin D deficiency, Fatigue, unspecified type, Morbid obesity ([place]), Obesity (BMI 35.0-39.9 without comorbidity), and Osteoporosis without current pathological fracture, unspecified osteoporosis type were also pertinent to this visit.  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  She is now s/p bilateral knee replacements and doing well.  No knee pain   Last seen in June for mild asthma exacerbation .  The nocturnal cough has resolved.   Having dysuria started today? Not sure on and off per mom   Had eye surgery yesterday at [facility] left lower lid to remove a chronic cyst .  Has eye drops prescribed post operatively.    Weight gain  Noted  No exercise opportunities .Reviewed diet.  She I served  dessert with lunch and dinner at the group home where she lives     Outpatient Medications Prior to Visit  Medication Sig Dispense Refill  . acetaminophen (TYLENOL) 325 MG tablet Take 2 tablets (650 mg total) by mouth 4 (four) times daily. 30 tablet 5  . alendronate (FOSAMAX) 70 MG tablet TAKE 1 TABLET BY MOUTH WEEKLY EARLY MORNING BEFORE FOOD/MEDS WITH WATER DO NOT LIE DOWN FOR 30 MINUTES 4 tablet 5  . azelastine (OPTIVAR) 0.05 % ophthalmic solution Place 1 drop into both eyes 2 (two) times daily. 6 mL 12  . benzonatate (TESSALON) 200 MG capsule Take 1 capsule (200 mg total) by mouth 2 (two) times daily as needed for cough. 20 capsule 0  . calcium-vitamin D (OSCAL WITH D) 500-200 MG-UNIT TABS tablet TAKEK 1 TABLET BY MOUTH EVERY DAY FOR SUPPLEMENT 30 each 2  . cholecalciferol (VITAMIN D) 1000 units tablet TAKEK 1 TABLET BY MOUTH EVERY DAY FOR SUPPLEMENT 30 tablet 5  . clotrimazole-betamethasone (LOTRISONE) cream Apply  1 application topically 2 (two) times daily. 45 g 2  . cyanocobalamin (,VITAMIN B-12,) 1000 MCG/ML injection GIVE 1000 MCG INJECTION "IM" ONCE A MONTH (B-12 SUPPLEMENT) 1 mL 11  . docusate sodium (COLACE) 100 MG capsule TAKE 1 CAPSULE BY MOUTH EVERY OTHER DAY 15 capsule 2  . folic acid (FOLVITE) 1 MG tablet TAKE 1 TABLET BY MOUTH EVERY DAY 30 tablet 2  . ketoconazole (NIZORAL) 2 % cream Apply 1 application topically daily. 60 g 2  . lactulose (CHRONULAC) 10 GM/15ML solution Take 30 g by mouth 2 (two) times daily as needed.    . levalbuterol (XOPENEX) 0.63 MG/3ML nebulizer solution Take 0.63 mg by nebulization 3 (three) times daily as needed for wheezing or shortness of breath.    . levothyroxine (SYNTHROID, LEVOTHROID) 125 MCG tablet TAKE 1 TABLET BY MOUTH EVERY MORNING ON AN EMPTY STOMACH WITH A GLASSOF WATER, 1 HOURS BEFORE BREAKFAST 30 tablet 10  . Liniments (BIOFLEXOR) 3 % GEL Apply to painful joints 2 to 3 times daily as needed 135 g 11  . magnesium hydroxide (MILK OF MAGNESIA) 400 MG/5ML suspension Take 30 mLs by mouth every 4 (four) hours as needed for mild constipation.    . methotrexate (RHEUMATREX) 2.5 MG tablet TAKE 7 TABLETS (17.5 MG) BY MOUTH ONCE AWEEK ON FRIDAY. **CAUTION: CHEMOTHERAPY.PROTECT FROM LIGHT** 28 tablet 3  . montelukast (SINGULAIR) 10 MG tablet TAKE 1 TABLET BY MOUTH AT BEDTIME 30 tablet 3  . Multiple  Vitamins-Minerals (THEREMS-M) TABS TAKEK 1 TABLET BY MOUTH EVERY DAY FOR SUPPLEMENT 30 tablet 1  . omeprazole (PRILOSEC) 20 MG capsule TAKE 1 CAPSULE BY MOUTH 2 TIMES PER DAY **DO NOT CRUSH** 60 capsule 5  . ondansetron (ZOFRAN) 4 MG tablet Take 1 tablet (4 mg total) by mouth every 8 (eight) hours as needed for nausea or vomiting. 20 tablet 5  . predniSONE (DELTASONE) 2.5 MG tablet TAKE 1 TABLET BY MOUTH EACH DAY 30 tablet 11  . senna (SENOKOT) 8.6 MG TABS tablet Take 2 tablets by mouth 2 (two) times daily.    . SYMBICORT 160-4.5 MCG/ACT inhaler INHALE 2 PUFFS ONCE DAILY  (1-2 MINUTES BETWEEN PUFFS) 10.2 g 11  . Syringe/Needle, Disp, (SYRINGE 3CC/25GX1") 25G X 1" 3 ML MISC Use for b12 injections 50 each 0  . traMADol (ULTRAM) 50 MG tablet Take 50 mg by mouth every 6 (six) hours as needed for moderate pain.    Marland Kitchen BISACODYL 5 MG EC tablet TAKE 1 TABLET BY MOUTH AT BEDTIME 30 tablet 2  . guaiFENesin (MUCINEX) 600 MG 12 hr tablet Take 1 tablet (600 mg total) by mouth 2 (two) times daily. (Patient not taking: Reported on 12/28/2017) 14 tablet 0  . diphenhydrAMINE (BENADRYL ALLERGY) 25 mg capsule 1 caspule 30 minutes before bedtime for 10 days (Patient not taking: Reported on 12/28/2017) 30 capsule 0  . levofloxacin (LEVAQUIN) 500 MG tablet Take 1 tablet (500 mg total) by mouth daily. (Patient not taking: Reported on 12/28/2017) 7 tablet 0  . olopatadine (PATANOL) 0.1 % ophthalmic solution Place 1 drop into both eyes daily.    . predniSONE (DELTASONE) 10 MG tablet 4 tabs by mouth once daily for 2 days, then 3 tabs daily x 2 days, then 2 tabs daily x 2 days, then 1 tab daily x 2 days (Patient not taking: Reported on 12/28/2017) 20 tablet 0  . predniSONE (DELTASONE) 10 MG tablet 6 tablets on Day 1 , then reduce by 1 tablet daily until gone (Patient not taking: Reported on 12/28/2017) 21 tablet 0   No facility-administered medications prior to visit.     Review of Systems;  Patient denies headache, fevers, malaise, unintentional weight loss, skin rash, eye pain, sinus congestion and sinus pain, sore throat, dysphagia,  hemoptysis , cough, dyspnea, wheezing, chest pain, palpitations, orthopnea, edema, abdominal pain, nausea, melena, diarrhea, constipation, flank pain, dysuria, hematuria, urinary  Frequency, nocturia, numbness, tingling, seizures,  Focal weakness, Loss of consciousness,  Tremor, insomnia, depression, anxiety, and suicidal ideation.      Objective:  BP 96/70 (BP Location: Left Arm, Patient Position: Sitting, Cuff Size: Normal)   Pulse 69   Temp 98 F (36.7 C)  (Oral)   Resp 15   Ht 4' 9"  (1.448 m)   Wt 171 lb 3.2 oz (77.7 kg)   LMP 04/11/1981   SpO2 95%   BMI 37.05 kg/m   BP Readings from Last 3 Encounters:  12/28/17 96/70  11/10/17 130/76  11/03/17 122/64    Wt Readings from Last 3 Encounters:  12/28/17 171 lb 3.2 oz (77.7 kg)  11/10/17 168 lb 6 oz (76.4 kg)  11/03/17 169 lb 8 oz (76.9 kg)    General appearance: alert, cooperative and appears stated age Ears: normal TM's and external ear canals both ears Throat: lips, mucosa, and tongue normal; teeth and gums normal Neck: no adenopathy, no carotid bruit, supple, symmetrical, trachea midline and thyroid not enlarged, symmetric, no tenderness/mass/nodules Back: symmetric, no curvature. ROM normal. No  CVA tenderness. Lungs: clear to auscultation bilaterally Heart: regular rate and rhythm, S1, S2 normal, no murmur, click, rub or gallop Abdomen: soft, non-tender; bowel sounds normal; no masses,  no organomegaly Pulses: 2+ and symmetric Skin: Skin color, texture, turgor normal. No rashes or lesions Lymph nodes: Cervical, supraclavicular, and axillary nodes normal.  Lab Results  Component Value Date   HGBA1C 5.5 12/28/2017   HGBA1C 5.7 06/27/2013    Lab Results  Component Value Date   CREATININE 0.85 12/28/2017   CREATININE 0.84 10/07/2017   CREATININE 0.82 07/28/2017    Lab Results  Component Value Date   WBC 6.9 12/28/2017   HGB 12.7 12/28/2017   HCT 38.5 12/28/2017   PLT 368.0 12/28/2017   GLUCOSE 100 (H) 12/28/2017   CHOL 174 05/19/2015   TRIG 97.0 05/19/2015   HDL 47.50 05/19/2015   LDLCALC 107 (H) 05/19/2015   ALT 14 12/28/2017   AST 25 12/28/2017   NA 141 12/28/2017   K 3.7 12/28/2017   CL 105 12/28/2017   CREATININE 0.85 12/28/2017   BUN 15 12/28/2017   CO2 30 12/28/2017   TSH 2.93 12/28/2017   INR 0.95 07/28/2017   HGBA1C 5.5 12/28/2017    Mm 3d Screen Breast Bilateral  Result Date: 10/11/2017 CLINICAL DATA:  Screening. EXAM: DIGITAL SCREENING  BILATERAL MAMMOGRAM WITH TOMO AND CAD COMPARISON:  Previous exam(s). ACR Breast Density Category b: There are scattered areas of fibroglandular density. FINDINGS: There are no findings suspicious for malignancy. Images were processed with CAD. IMPRESSION: No mammographic evidence of malignancy. A result letter of this screening mammogram will be mailed directly to the patient. RECOMMENDATION: Screening mammogram in one year. (Code:SM-B-01Y) BI-RADS CATEGORY  1: Negative. Electronically Signed   By: Curlene Dolphin M.D.   On: 10/11/2017 12:37    Assessment & Plan:   Problem List Items Addressed This Visit    Hypothyroid    Thyroid function is WNL on current dose.  No current changes needed.   Lab Results  Component Value Date   TSH 2.93 12/28/2017         Relevant Orders   TSH (Completed)   Obesity (BMI 35.0-39.9 without comorbidity)    Weight has been unchanged for years.  Becuase she has Down's syndrome and lives in a group  Home, there are no opportunities for regular exercise . BMI is 37 and she has now had bilateral knee replacements. Recommended cutting back on desserts and encourage mother to seek out opportunities ot engage her in exercise.       Osteoporosis    Managed with weekly alendronate since  2012.  History of fractures in 2006 .  Length of bisphosphonate therapy approaching the end, with  no recent DEXA.  Will assess bone density this year.      Relevant Orders   DG Bone Density    Other Visit Diagnoses    Dysuria    -  Primary   Relevant Orders   POCT urinalysis dipstick   Urine Culture   Urine Microscopic Only   Vitamin D deficiency       Relevant Orders   VITAMIN D 25 Hydroxy (Vit-D Deficiency, Fractures) (Completed)   Fatigue, unspecified type       Relevant Orders   CBC with Differential/Platelet (Completed)   Comprehensive metabolic panel (Completed)   Morbid obesity (HCC)       Relevant Orders   Hemoglobin A1c (Completed)    A total of 25 minutes of  face to  face time was spent with patient more than half of which was spent in counselling about the above mentioned conditions  and coordination of care   I have discontinued Alysson L. Platte's olopatadine, levofloxacin, diphenhydrAMINE, and bisacodyl. I am also having her maintain her ondansetron, acetaminophen, SYRINGE 3CC/25GX1", levalbuterol, lactulose, magnesium hydroxide, senna, traMADol, clotrimazole-betamethasone, ketoconazole, BIOFLEXOR, guaiFENesin, benzonatate, azelastine, methotrexate, docusate sodium, cholecalciferol, THEREMS-M, levothyroxine, predniSONE, cyanocobalamin, SYMBICORT, alendronate, calcium-vitamin D, folic acid, montelukast, and omeprazole.  No orders of the defined types were placed in this encounter.   Medications Discontinued During This Encounter  Medication Reason  . diphenhydrAMINE (BENADRYL ALLERGY) 25 mg capsule Patient has not taken in last 30 days  . levofloxacin (LEVAQUIN) 500 MG tablet Patient has not taken in last 30 days  . olopatadine (PATANOL) 0.1 % ophthalmic solution Patient has not taken in last 30 days  . predniSONE (DELTASONE) 10 MG tablet   . predniSONE (DELTASONE) 10 MG tablet Completed Course  . BISACODYL 5 MG EC tablet     Follow-up: Return in about 6 months (around 06/30/2018).   Crecencio Mc, MD

## 2017-12-29 ENCOUNTER — Other Ambulatory Visit: Payer: Self-pay | Admitting: Internal Medicine

## 2017-12-29 NOTE — Telephone Encounter (Signed)
Rx refill request:  Senokot 8.6 mg       Last filled: 08/31/17     Historical medication   LOV: 12/28/17  PCP: Cindy Oconnor  Pharmacy: verified

## 2017-12-29 NOTE — Telephone Encounter (Signed)
Copied from [place] [id number]. Topic: Quick Communication - Rx Refill/Question >> Dec 29, 2017 10:23 AM Synthia Innocent wrote: Medication:senna (SENOKOT) 8.6 MG TABS tablet  Has the patient contacted their pharmacy? Yes.   (Agent: If no, request that the patient contact the pharmacy for the refill.) (Agent: If yes, when and what did the pharmacy advise?)  Preferred Pharmacy (with phone number or street name): [facility]  Agent: Please be advised that RX refills may take up to 3 business days. We ask that you follow-up with your pharmacy.

## 2017-12-30 NOTE — Telephone Encounter (Signed)
rx refill. It looks like this was filled by another provider.

## 2017-12-30 NOTE — Assessment & Plan Note (Signed)
Thyroid function is WNL on current dose.  No current changes needed.   Lab Results  Component Value Date   TSH 2.93 12/28/2017

## 2017-12-30 NOTE — Assessment & Plan Note (Addendum)
Managed with weekly alendronate since  2012.  History of fractures in 2006 .  Length of bisphosphonate therapy approaching the end, with  no recent DEXA.  Will assess bone density this year.

## 2017-12-30 NOTE — Assessment & Plan Note (Signed)
Weight has been unchanged for years.  Becuase she has Down's syndrome and lives in a group  Home, there are no opportunities for regular exercise . BMI is 37 and she has now had bilateral knee replacements. Recommended cutting back on desserts and encourage mother to seek out opportunities ot engage her in exercise.

## 2018-01-02 ENCOUNTER — Other Ambulatory Visit: Payer: Self-pay | Admitting: Internal Medicine

## 2018-01-02 ENCOUNTER — Telehealth: Payer: Self-pay | Admitting: Internal Medicine

## 2018-01-02 MED ORDER — SENNA 8.6 MG PO TABS: 2.0000 | ORAL_TABLET | Freq: Two times a day (BID) | ORAL | 11 refills | Status: DC

## 2018-01-02 NOTE — Telephone Encounter (Signed)
rx request was sent to Dr. Derrel Nip

## 2018-01-02 NOTE — Telephone Encounter (Signed)
rx request 

## 2018-01-02 NOTE — Telephone Encounter (Signed)
Copied from [place] [id number]. Topic: General - Other >> Jan 02, 2018 10:21 AM Cecelia Byars, NT wrote: Reason for CRM: Patients caregiver [name] called and said she would like to know if she she discontinue the  senna (SENOKOT) 8.6 MG TABS tablet for the patient due  to the pharmacy sending a letters stating not appropriate for refill , please call [phone] ask for [place] ,

## 2018-01-02 NOTE — Telephone Encounter (Signed)
Refilled ,  But that's an AWFULLY HIGH dose for someone who is not taking opioids.   plsae find out if it should be "Prn"  Or scheduled

## 2018-01-02 NOTE — Telephone Encounter (Signed)
Historical medication Last OV: 12/28/2017 Next OV: 07/03/2018

## 2018-01-11 DIAGNOSIS — H903 Sensorineural hearing loss, bilateral: Secondary | ICD-10-CM | POA: Diagnosis not present

## 2018-01-11 DIAGNOSIS — H6123 Impacted cerumen, bilateral: Secondary | ICD-10-CM | POA: Diagnosis not present

## 2018-01-16 ENCOUNTER — Other Ambulatory Visit: Payer: Self-pay | Admitting: Internal Medicine

## 2018-01-19 MED ORDER — SENNA 8.6 MG PO TABS: 2.0000 | ORAL_TABLET | Freq: Every day | ORAL | 11 refills | Status: DC

## 2018-01-19 NOTE — Telephone Encounter (Signed)
DOSE REDUCED TO ONCE DAILY AND SENT

## 2018-01-19 NOTE — Telephone Encounter (Signed)
Refilled: 10/07/2017 Last OV: 12/28/2017 Next OV: 07/03/2018

## 2018-01-19 NOTE — Telephone Encounter (Signed)
Spoke with pt's mother and she stated that the she does know that the pt is taking two tablets of the sennakot at bedtime but doesn't know if they are giving her the two in the morning. Mother also stated that the pt does have frequent bowels and they are very loose. The mother stated that she does not feel the pt needs the two in the morning.

## 2018-01-19 NOTE — Addendum Note (Signed)
Addended by: Crecencio Mc on: 01/19/2018 05:33 PM   Modules accepted: Orders

## 2018-01-26 ENCOUNTER — Other Ambulatory Visit: Payer: Self-pay | Admitting: Internal Medicine

## 2018-02-23 ENCOUNTER — Other Ambulatory Visit: Payer: Self-pay | Admitting: Internal Medicine

## 2018-02-24 ENCOUNTER — Encounter: Payer: Self-pay | Admitting: Internal Medicine

## 2018-03-24 ENCOUNTER — Other Ambulatory Visit: Payer: Self-pay | Admitting: Internal Medicine

## 2018-03-29 NOTE — Telephone Encounter (Signed)
Requested medication (s) are due for refill today -yes  Requested medication (s) are on the active medication list -yes  Future visit scheduled -yes  Last refill: written- 12/02/17 3 RF  Notes to clinic: Rx refill request of non delegated medication  Requested Prescriptions  Pending Prescriptions Disp Refills   methotrexate (RHEUMATREX) 2.5 MG tablet [Pharmacy Med Name: METHOTREXATE 2.5 MG TAB] 28 tablet 3    Sig: TAKE 7 TABLETS (17.5 MG) BY MOUTH ONCE AWEEK ON FRIDAY. **CAUTION: CHEMOTHERAPY.PROTECT FROM LIGHT**     Not Delegated - Immunology: Immunosuppressive Agents - methotrexate Failed - 03/29/2018  2:51 PM      Failed - This refill cannot be delegated      Failed - ALT in normal range and within 90 days    ALT  Date Value Ref Range Status  12/28/2017 14 0 - 35 U/L Final   SGPT (ALT)  Date Value Ref Range Status  06/28/2013 14 12 - 78 U/L Final         Failed - AST in normal range and within 90 days    AST  Date Value Ref Range Status  12/28/2017 25 0 - 37 U/L Final   SGOT(AST)  Date Value Ref Range Status  06/28/2013 21 15 - 37 Unit/L Final         Failed - Cr in normal range and within 90 days    Creatinine  Date Value Ref Range Status  06/28/2013 0.74 0.60 - 1.30 mg/dL Final   Creat  Date Value Ref Range Status  08/05/2011 1.06 0.50 - 1.10 mg/dL Final   Creatinine, Ser  Date Value Ref Range Status  12/28/2017 0.85 0.40 - 1.20 mg/dL Final         Failed - HCT in normal range and within 90 days    HCT  Date Value Ref Range Status  12/28/2017 38.5 36.0 - 46.0 % Final  06/28/2013 36.1 35.0 - 47.0 % Final         Failed - HGB in normal range and within 90 days    Hemoglobin  Date Value Ref Range Status  12/28/2017 12.7 12.0 - 15.0 g/dL Final   HGB  Date Value Ref Range Status  06/28/2013 12.3 12.0 - 16.0 g/dL Final         Failed - PLT in normal range and within 90 days    Platelets  Date Value Ref Range Status  12/28/2017 368.0 150.0 - 400.0  K/uL Final   Platelet  Date Value Ref Range Status  06/28/2013 215 150 - 440 x10 3/mm 3 Final         Failed - RBC in normal range and within 90 days    RBC  Date Value Ref Range Status  12/28/2017 3.70 (L) 3.87 - 5.11 Mil/uL Final         Failed - WBC in normal range and within 90 days    WBC  Date Value Ref Range Status  12/28/2017 6.9 4.0 - 10.5 K/uL Final         Failed - Valid encounter within last 3 months    Recent Outpatient Visits          3 months ago [place], [place], MD   4 months ago Moderate persistent asthma with exacerbation   [facility] [place] Crecencio Mc, MD   4 months ago Bronchitis with bronchospasm   [facility] Debbrah Alar, NP  5 months ago Long-term use of high-risk medication   [place] Crecencio Mc, MD   9 months ago B12 deficiency   [place], MD      Future Appointments            In 3 months Derrel Nip, Aris Everts, MD [place], [place]         Signed Prescriptions Disp Refills   calcium-vitamin D (OSCAL WITH D) 500-200 MG-UNIT TABS tablet 30 each 2    Sig: TAKEK 1 TABLET BY MOUTH EVERY DAY FOR SUPPLEMENT     Endocrinology: Minerals - Calcium Supplementation with Vitamin D Failed - 03/29/2018  2:51 PM      Failed - Phosphate in normal range and within 360 days    No results found for: PHOS       Failed - Vit D in normal range and within 360 days    VITD  Date Value Ref Range Status  12/28/2017 33.50 30.00 - 100.00 ng/mL Final         Passed - Ca in normal range and within 360 days    Calcium  Date Value Ref Range Status  12/28/2017 9.2 8.4 - 10.5 mg/dL Final   Calcium, Total  Date Value Ref Range Status  06/28/2013 7.6 (L) 8.5 - 10.1 mg/dL Final         Passed - Valid encounter within last 12 months    Recent Outpatient Visits          3 months ago [place] Crecencio Mc, MD   4 months ago Moderate persistent asthma with exacerbation   [facility] Crecencio Mc, MD   4 months ago Bronchitis with bronchospasm   [place] O'Sullivan, Lenna Sciara, NP   5 months ago Long-term use of high-risk medication   [facility] [place] Crecencio Mc, MD   9 months ago B12 deficiency   [facility] [place] Crecencio Mc, MD      Future Appointments            In 3 months Derrel Nip, Aris Everts, MD [facility] [place]          folic acid (FOLVITE) 1 MG tablet 30 tablet 2    Sig: TAKE 1 TABLET BY MOUTH EVERY DAY     Endocrinology:  Vitamins Passed - 03/29/2018  2:51 PM      Passed - Valid encounter within last 12 months    Recent Outpatient Visits          3 months ago Dysuria   [facility] [place] Crecencio Mc, MD   4 months ago Moderate persistent asthma with exacerbation   [facility] [place] Crecencio Mc, MD   4 months ago Bronchitis with bronchospasm   [place] O'Sullivan, [place], NP   5 months ago Long-term use of high-risk medication   [facility] [place] Crecencio Mc, MD   9 months ago B12 deficiency   [facility]  Crecencio Mc, MD      Future Appointments            In 3 months Derrel Nip, Aris Everts, MD [place], [facility]            Requested Prescriptions  Pending Prescriptions Disp Refills   methotrexate (RHEUMATREX) 2.5 MG tablet [Pharmacy Med  Name: METHOTREXATE 2.5 MG TAB] 28 tablet 3    Sig: TAKE 7 TABLETS (17.5 MG) BY MOUTH ONCE AWEEK ON FRIDAY. **CAUTION: CHEMOTHERAPY.PROTECT FROM LIGHT**     Not Delegated - Immunology: Immunosuppressive Agents - methotrexate Failed - 03/29/2018  2:51 PM      Failed - This refill cannot be delegated      Failed - ALT in normal range and within 90 days    ALT  Date Value  Ref Range Status  12/28/2017 14 0 - 35 U/L Final   SGPT (ALT)  Date Value Ref Range Status  06/28/2013 14 12 - 78 U/L Final         Failed - AST in normal range and within 90 days    AST  Date Value Ref Range Status  12/28/2017 25 0 - 37 U/L Final   SGOT(AST)  Date Value Ref Range Status  06/28/2013 21 15 - 37 Unit/L Final         Failed - Cr in normal range and within 90 days    Creatinine  Date Value Ref Range Status  06/28/2013 0.74 0.60 - 1.30 mg/dL Final   Creat  Date Value Ref Range Status  08/05/2011 1.06 0.50 - 1.10 mg/dL Final   Creatinine, Ser  Date Value Ref Range Status  12/28/2017 0.85 0.40 - 1.20 mg/dL Final         Failed - HCT in normal range and within 90 days    HCT  Date Value Ref Range Status  12/28/2017 38.5 36.0 - 46.0 % Final  06/28/2013 36.1 35.0 - 47.0 % Final         Failed - HGB in normal range and within 90 days    Hemoglobin  Date Value Ref Range Status  12/28/2017 12.7 12.0 - 15.0 g/dL Final   HGB  Date Value Ref Range Status  06/28/2013 12.3 12.0 - 16.0 g/dL Final         Failed - PLT in normal range and within 90 days    Platelets  Date Value Ref Range Status  12/28/2017 368.0 150.0 - 400.0 K/uL Final   Platelet  Date Value Ref Range Status  06/28/2013 215 150 - 440 x10 3/mm 3 Final         Failed - RBC in normal range and within 90 days    RBC  Date Value Ref Range Status  12/28/2017 3.70 (L) 3.87 - 5.11 Mil/uL Final         Failed - WBC in normal range and within 90 days    WBC  Date Value Ref Range Status  12/28/2017 6.9 4.0 - 10.5 K/uL Final         Failed - Valid encounter within last 3 months    Recent Outpatient Visits          3 months ago [place], Teresa L, MD   4 months ago Moderate persistent asthma with exacerbation   [facility] [place], Teresa L, MD   4 months ago Bronchitis with bronchospasm   [place]  O'Sullivan, Lenna Sciara, NP   5 months ago Long-term use of high-risk medication   [place] [place] Crecencio Mc, MD   9 months ago B12 deficiency   [facility] [place] Crecencio Mc, MD      Future Appointments            In 3 months [place],  Aris Everts, MD [place], [facility]         Signed Prescriptions Disp Refills   calcium-vitamin D (OSCAL WITH D) 500-200 MG-UNIT TABS tablet 30 each 2    Sig: TAKEK 1 TABLET BY MOUTH EVERY DAY FOR SUPPLEMENT     Endocrinology: Minerals - Calcium Supplementation with Vitamin D Failed - 03/29/2018  2:51 PM      Failed - Phosphate in normal range and within 360 days    No results found for: PHOS       Failed - Vit D in normal range and within 360 days    VITD  Date Value Ref Range Status  12/28/2017 33.50 30.00 - 100.00 ng/mL Final         Passed - Ca in normal range and within 360 days    Calcium  Date Value Ref Range Status  12/28/2017 9.2 8.4 - 10.5 mg/dL Final   Calcium, Total  Date Value Ref Range Status  06/28/2013 7.6 (L) 8.5 - 10.1 mg/dL Final         Passed - Valid encounter within last 12 months    Recent Outpatient Visits          3 months ago [place] Crecencio Mc, MD   4 months ago Moderate persistent asthma with exacerbation   [facility] [place] Crecencio Mc, MD   4 months ago Bronchitis with bronchospasm   [place] O'Sullivan, [place], NP   5 months ago Long-term use of high-risk medication   [facility] [place] Crecencio Mc, MD   9 months ago B12 deficiency   [place] [facility] [place] Crecencio Mc, MD      Future Appointments            In 3 months Derrel Nip, Aris Everts, MD [place]          folic acid (FOLVITE) 1 MG tablet 30 tablet 2    Sig: TAKE 1 TABLET BY MOUTH EVERY DAY     Endocrinology:  Vitamins Passed - 03/29/2018  2:51 PM       Passed - Valid encounter within last 12 months    Recent Outpatient Visits          3 months ago Dysuria   [facility] [place] Crecencio Mc, MD   4 months ago Moderate persistent asthma with exacerbation   [facility] [place] Crecencio Mc, MD   4 months ago Bronchitis with bronchospasm   [place] O'Sullivan, [place], NP   5 months ago Long-term use of high-risk medication   [facility] [place] Crecencio Mc, MD   9 months ago B12 deficiency   [facility]  Crecencio Mc, MD      Future Appointments            In 3 months Derrel Nip, Aris Everts, MD [facility], [facility]

## 2018-03-29 NOTE — Telephone Encounter (Signed)
Cindy Oconnor with [facility] following up on this request for refills.  They need asap, because they need in order to be delivered on Friday.   folic acid (FOLVITE) 1 MG tablet calcium-vitamin D (OSCAL WITH D) 500-200 MG-UNIT TABS tablet methotrexate (RHEUMATREX) 2.5 MG tablet   [facility] - [place], [place] - [address] [phone] (Phone) [phone] (Fax)

## 2018-03-29 NOTE — Telephone Encounter (Signed)
Refilled: 12/02/2017 Last OV: 12/28/2017 Next OV: 07/03/2018

## 2018-03-30 DIAGNOSIS — M79675 Pain in left toe(s): Secondary | ICD-10-CM | POA: Diagnosis not present

## 2018-03-30 DIAGNOSIS — M79674 Pain in right toe(s): Secondary | ICD-10-CM | POA: Diagnosis not present

## 2018-03-30 DIAGNOSIS — B351 Tinea unguium: Secondary | ICD-10-CM | POA: Diagnosis not present

## 2018-04-05 DIAGNOSIS — H10531 Contact blepharoconjunctivitis, right eye: Secondary | ICD-10-CM | POA: Diagnosis not present

## 2018-04-20 DIAGNOSIS — H02051 Trichiasis without entropian right upper eyelid: Secondary | ICD-10-CM | POA: Diagnosis not present

## 2018-04-22 ENCOUNTER — Other Ambulatory Visit: Payer: Self-pay | Admitting: Internal Medicine

## 2018-05-16 DIAGNOSIS — M150 Primary generalized (osteo)arthritis: Secondary | ICD-10-CM | POA: Diagnosis not present

## 2018-05-16 DIAGNOSIS — M2548 Effusion, other site: Secondary | ICD-10-CM | POA: Diagnosis not present

## 2018-05-16 DIAGNOSIS — M069 Rheumatoid arthritis, unspecified: Secondary | ICD-10-CM | POA: Diagnosis not present

## 2018-05-16 DIAGNOSIS — M1990 Unspecified osteoarthritis, unspecified site: Secondary | ICD-10-CM | POA: Diagnosis not present

## 2018-05-22 ENCOUNTER — Other Ambulatory Visit: Payer: Self-pay | Admitting: Internal Medicine

## 2018-06-09 ENCOUNTER — Ambulatory Visit (INDEPENDENT_AMBULATORY_CARE_PROVIDER_SITE_OTHER): Payer: Medicare Other

## 2018-06-09 ENCOUNTER — Encounter: Payer: Self-pay | Admitting: Family Medicine

## 2018-06-09 ENCOUNTER — Ambulatory Visit (INDEPENDENT_AMBULATORY_CARE_PROVIDER_SITE_OTHER): Payer: Medicare Other | Admitting: Family Medicine

## 2018-06-09 VITALS — BP 98/78 | HR 78 | Temp 98.5°F | Ht <= 58 in | Wt 167.4 lb

## 2018-06-09 DIAGNOSIS — R059 Cough, unspecified: Secondary | ICD-10-CM

## 2018-06-09 DIAGNOSIS — J4541 Moderate persistent asthma with (acute) exacerbation: Secondary | ICD-10-CM | POA: Diagnosis not present

## 2018-06-09 DIAGNOSIS — R05 Cough: Secondary | ICD-10-CM | POA: Diagnosis not present

## 2018-06-09 DIAGNOSIS — R6889 Other general symptoms and signs: Secondary | ICD-10-CM

## 2018-06-09 DIAGNOSIS — J449 Chronic obstructive pulmonary disease, unspecified: Secondary | ICD-10-CM | POA: Diagnosis not present

## 2018-06-09 LAB — POC INFLUENZA A&B (BINAX/QUICKVUE)
Influenza A, POC: NEGATIVE
Influenza B, POC: NEGATIVE

## 2018-06-09 MED ORDER — PREDNISONE 20 MG PO TABS: 40.0000 mg | ORAL_TABLET | Freq: Every day | ORAL | 0 refills | Status: DC

## 2018-06-09 NOTE — Patient Instructions (Signed)
Nice to see you. We will get a chest x-ray today and contact you with the results. We will treat you with prednisone for an asthma exacerbation. If you develop worsening shortness of breath or you develop cough productive of blood, fevers, persistent chest pain, or any new or changing symptoms please seek medical attention immediately.

## 2018-06-09 NOTE — Assessment & Plan Note (Signed)
Symptoms most consistent with asthma exacerbation lately precipitated by a viral illness.  Flu testing negative.  Will perform a chest x-ray given productive cough and mild dyspnea.  Will treat with prednisone.  She will continue Xopenex nebulizer treatments every 8 hours for the next 2 days and then every 8 hours as needed.  This plan was discussed with her caregiver and provided in writing for her facility.  Return precautions given.

## 2018-06-09 NOTE — Progress Notes (Signed)
Tommi Rumps, MD Phone: [phone]  Cindy Oconnor is a 48 y.o. female who presents today for same-day visit.  CC: Cough  Patient presents with a caregiver.  The patient and the caregiver note the patient symptoms started 2 days ago.  She has had cough productive of yellow mucus.  Some wheezing.  Some shortness of breath.  She has had chills on one occasion.  No significant sinus pressure or nasal congestion or discharge.  No fevers.  She notes her chest hurts when she coughs though otherwise no chest pain.  No abdominal pain.  No vomiting.  She had one episode of diarrhea that was nonbloody.  She continues on her inhalers with some benefit.  Social History   Tobacco Use  Smoking Status Never Smoker  Smokeless Tobacco Never Used     ROS see history of present illness  Objective  Physical Exam Vitals:   06/09/18 1533  BP: 98/78  Pulse: 78  Temp: 98.5 F (36.9 C)  SpO2: 94%    BP Readings from Last 3 Encounters:  06/09/18 98/78  12/28/17 96/70  11/10/17 130/76   Wt Readings from Last 3 Encounters:  06/09/18 167 lb 6.4 oz (75.9 kg)  12/28/17 171 lb 3.2 oz (77.7 kg)  11/10/17 168 lb 6 oz (76.4 kg)    Physical Exam Constitutional:      General: She is not in acute distress.    Appearance: She is not diaphoretic.  HENT:     Head: Normocephalic and atraumatic.     Right Ear: Tympanic membrane normal.     Left Ear: Tympanic membrane normal.     Mouth/Throat:     Mouth: Mucous membranes are moist.     Pharynx: Posterior oropharyngeal erythema (Mild) present.  Eyes:     Conjunctiva/sclera: Conjunctivae normal.     Pupils: Pupils are equal, round, and reactive to light.  Cardiovascular:     Rate and Rhythm: Normal rate and regular rhythm.     Heart sounds: Normal heart sounds.  Pulmonary:     Effort: Pulmonary effort is normal.     Breath sounds: Normal breath sounds.  Lymphadenopathy:     Cervical: No cervical adenopathy.  Skin:    General: Skin is  warm and dry.  Neurological:     Mental Status: She is alert.      Assessment/Plan: Please see individual problem list.  Moderate persistent asthma with acute exacerbation Symptoms most consistent with asthma exacerbation lately precipitated by a viral illness.  Flu testing negative.  Will perform a chest x-ray given productive cough and mild dyspnea.  Will treat with prednisone.  She will continue Xopenex nebulizer treatments every 8 hours for the next 2 days and then every 8 hours as needed.  This plan was discussed with her caregiver and provided in writing for her facility.  Return precautions given.    Orders Placed This Encounter  Procedures  . DG Chest 2 View    Standing Status:   Future    Number of Occurrences:   1    Standing Expiration Date:   08/08/2019    Order Specific Question:   Reason for Exam (SYMPTOM  OR DIAGNOSIS REQUIRED)    Answer:   cough, wheezing, history of asthma    Order Specific Question:   Is patient pregnant?    Answer:   No    Order Specific Question:   Preferred imaging location?    Answer:   [facility]  Order Specific Question:   Radiology Contrast Protocol - do NOT remove file path    Answer:   [email or web address].pdf  . POC Influenza A&B(BINAX/QUICKVUE)    Meds ordered this encounter  Medications  . predniSONE (DELTASONE) 20 MG tablet    Sig: Take 2 tablets (40 mg total) by mouth daily with breakfast.    Dispense:  10 tablet    Refill:  0     Tommi Rumps, MD [place]

## 2018-06-13 ENCOUNTER — Ambulatory Visit: Payer: Medicare Other | Admitting: Family Medicine

## 2018-06-13 ENCOUNTER — Other Ambulatory Visit: Payer: Self-pay | Admitting: Internal Medicine

## 2018-06-14 ENCOUNTER — Ambulatory Visit: Payer: Medicare Other | Admitting: Family Medicine

## 2018-06-16 ENCOUNTER — Encounter: Payer: Self-pay | Admitting: Internal Medicine

## 2018-06-16 ENCOUNTER — Ambulatory Visit (INDEPENDENT_AMBULATORY_CARE_PROVIDER_SITE_OTHER): Payer: Medicare Other | Admitting: Internal Medicine

## 2018-06-16 DIAGNOSIS — J4541 Moderate persistent asthma with (acute) exacerbation: Secondary | ICD-10-CM

## 2018-06-16 MED ORDER — PREDNISONE 10 MG PO TABS: ORAL_TABLET | ORAL | 0 refills | Status: DC

## 2018-06-16 MED ORDER — AZITHROMYCIN 250 MG PO TABS: ORAL_TABLET | ORAL | 0 refills | Status: DC

## 2018-06-16 MED ORDER — BENZONATATE 200 MG PO CAPS: ORAL_CAPSULE | ORAL | 1 refills | Status: DC

## 2018-06-16 MED ORDER — DEXTROMETHORPHAN HBR 15 MG/5ML PO SYRP: 10.0000 mL | ORAL_SOLUTION | Freq: Four times a day (QID) | ORAL | 0 refills | Status: DC | PRN

## 2018-06-16 MED ORDER — LEVALBUTEROL HCL 0.63 MG/3ML IN NEBU: 0.6300 mg | INHALATION_SOLUTION | Freq: Four times a day (QID) | RESPIRATORY_TRACT | 1 refills | Status: DC | PRN

## 2018-06-16 NOTE — Progress Notes (Signed)
Subjective:  Patient ID: Cindy Oconnor, female    DOB: [birth year]  Age: 48 y.o. MRN: [id number]  CC: The encounter diagnosis was Moderate persistent asthma with acute exacerbation.  HPI Cindy Oconnor presents for follow up on cough .  Treated on Jan 10 by ES for asthma exacerbation with prednisone, cough suppressant and prn  xopenex nebs.  Chest x ray done Jan 10 was reviewed and clear of infiltrates but showed hyperinflation of both lungs.   Patient states that she feels better but per caregiver reports that she is still wheezing and coughing frequently .  Caregiver confirms that patient is receiving  at least 2 nebulized treatments daily .    She has not had the influenza vaccine this year yet.    Outpatient Medications Prior to Visit  Medication Sig Dispense Refill  . acetaminophen (TYLENOL) 325 MG tablet Take 2 tablets (650 mg total) by mouth 4 (four) times daily. 30 tablet 5  . alendronate (FOSAMAX) 70 MG tablet TAKE 1 TABLET BY MOUTH WEEKLY EARLY MORNING BEFORE FOOD/MEDS WITH WATER DO NOT LIE DOWN FOR 30 MINUTES 4 tablet 5  . azelastine (OPTIVAR) 0.05 % ophthalmic solution Place 1 drop into both eyes 2 (two) times daily. 6 mL 12  . calcium-vitamin D (OSCAL WITH D) 500-200 MG-UNIT TABS tablet TAKEK 1 TABLET BY MOUTH EVERY DAY FOR SUPPLEMENT 30 each 2  . clotrimazole-betamethasone (LOTRISONE) cream APPLY TOPICALLY 2 TIMES PER DAY 45 g 2  . cyanocobalamin (,VITAMIN B-12,) 1000 MCG/ML injection GIVE 1000 MCG INJECTION "IM" ONCE A MONTH (B-12 SUPPLEMENT) 1 mL 11  . D3-1000 25 MCG (1000 UT) tablet TAKEK 1 TABLET BY MOUTH EVERY DAY FOR SUPPLEMENT 30 tablet 5  . docusate sodium (COLACE) 100 MG capsule TAKE 1 CAPSULE BY MOUTH EVERY OTHER DAY 15 capsule 2  . folic acid (FOLVITE) 1 MG tablet TAKE 1 TABLET BY MOUTH EVERY DAY 30 tablet 2  . ketoconazole (NIZORAL) 2 % cream Apply 1 application topically daily. 60 g 2  . lactulose (CHRONULAC) 10 GM/15ML solution Take 30 g by mouth 2 (two)  times daily as needed.    [place] Kitchen levothyroxine (SYNTHROID, LEVOTHROID) 125 MCG tablet TAKE 1 TABLET BY MOUTH EVERY MORNING ON AN EMPTY STOMACH WITH A GLASSOF WATER, 1 HOURS BEFORE BREAKFAST 30 tablet 10  . Liniments (BIOFLEXOR) 3 % GEL Apply to painful joints 2 to 3 times daily as needed 135 g 11  . magnesium hydroxide (MILK OF MAGNESIA) 400 MG/5ML suspension Take 30 mLs by mouth every 4 (four) hours as needed for mild constipation.    . methotrexate (RHEUMATREX) 2.5 MG tablet TAKE 7 TABLETS (17.5 MG) BY MOUTH ONCE AWEEK ON FRIDAY. **CAUTION: CHEMOTHERAPY.PROTECT FROM LIGHT** 28 tablet 3  . montelukast (SINGULAIR) 10 MG tablet TAKE 1 TABLET BY MOUTH AT BEDTIME 30 tablet 3  . Multiple Vitamins-Minerals (THEREMS-M) TABS TAKEK 1 TABLET BY MOUTH EVERY DAY FOR SUPPLEMENT 90 tablet 1  . omeprazole (PRILOSEC) 20 MG capsule TAKE 1 CAPSULE BY MOUTH 2 TIMES PER DAY **DO NOT CRUSH** 60 capsule 5  . ondansetron (ZOFRAN) 4 MG tablet Take 1 tablet (4 mg total) by mouth every 8 (eight) hours as needed for nausea or vomiting. 20 tablet 5  . predniSONE (DELTASONE) 2.5 MG tablet TAKE 1 TABLET BY MOUTH EACH DAY 30 tablet 11  . senna (SENOKOT) 8.6 MG TABS tablet Take 2 tablets (17.2 mg total) by mouth at bedtime. 120 tablet 11  . SYMBICORT 160-4.5 MCG/ACT inhaler INHALE 2  PUFFS ONCE DAILY (1-2 MINUTES BETWEEN PUFFS) 10.2 g 11  . Syringe/Needle, Disp, (SYRINGE 3CC/25GX1") 25G X 1" 3 ML MISC Use for b12 injections 50 each 0  . traMADol (ULTRAM) 50 MG tablet Take 50 mg by mouth every 6 (six) hours as needed for moderate pain.    . benzonatate (TESSALON) 200 MG capsule TAKE 1 CAPSULE BY MOUTH 3 TIMES PER DAY AS NEEDED FOR COUGH 30 capsule 1  . levalbuterol (XOPENEX) 0.63 MG/3ML nebulizer solution Take 0.63 mg by nebulization 3 (three) times daily as needed for wheezing or shortness of breath.    . predniSONE (DELTASONE) 20 MG tablet Take 2 tablets (40 mg total) by mouth daily with breakfast. 10 tablet 0   No  facility-administered medications prior to visit.     Review of Systems;  Patient denies headache, fevers, malaise, unintentional weight loss, skin rash, eye pain, sinus congestion and sinus pain, sore throat, dysphagia,  hemoptysis ,dyspnea,  chest pain, palpitations, orthopnea, edema, abdominal pain, nausea, melena, diarrhea, constipation, flank pain, dysuria, hematuria, urinary  Frequency, nocturia, numbness, tingling, seizures,  Focal weakness, Loss of consciousness,  Tremor, insomnia, depression, anxiety, and suicidal ideation.      Objective:  BP 102/66 (BP Location: Left Arm, Patient Position: Sitting, Cuff Size: Normal)   Pulse 64   Temp 98.2 F (36.8 C) (Oral)   Resp 15   Ht 4' 9"  (1.448 m)   Wt 165 lb 6.4 oz (75 kg)   LMP 04/11/1981   SpO2 99%   BMI 35.79 kg/m   BP Readings from Last 3 Encounters:  06/16/18 102/66  06/09/18 98/78  12/28/17 96/70    Wt Readings from Last 3 Encounters:  06/16/18 165 lb 6.4 oz (75 kg)  06/09/18 167 lb 6.4 oz (75.9 kg)  12/28/17 171 lb 3.2 oz (77.7 kg)    General appearance: alert, cooperative and appears stated age Neck: no adenopathy, no carotid bruit, supple, symmetrical, trachea midline and thyroid not enlarged, symmetric, no tenderness/mass/nodules Back: symmetric, no curvature. ROM normal. No CVA tenderness. Lungs: bilateral wheezing,  Fair air movement ,  No egophony  Heart: regular rate and rhythm, S1, S2 normal, no murmur, click, rub or gallop Abdomen: soft, non-tender; bowel sounds normal; no masses,  no organomegaly Pulses: 2+ and symmetric Skin: Skin color, texture, turgor normal. No rashes or lesions Lymph nodes: Cervical, supraclavicular, and axillary nodes normal.   Assessment & Plan:   Problem List Items Addressed This Visit    Moderate persistent asthma with acute exacerbation    She feels generally better but continues to have wheezing on exam and cough reported by caregiver.  Will repeat prednisone with a  longer treatment (60 mg qd x 3, , then 10 mg taper daily ) ,  Adding azithromycin , scheduleing nebulized treatments every 6 hours  And refill cough suppressant .      Relevant Medications   predniSONE (DELTASONE) 10 MG tablet   levalbuterol (XOPENEX) 0.63 MG/3ML nebulizer solution      I have changed Cindy Oconnor's predniSONE, benzonatate, and levalbuterol. I am also having her start on dextromethorphan and azithromycin. Additionally, I am having her maintain her ondansetron, acetaminophen, SYRINGE 3CC/25GX1", lactulose, magnesium hydroxide, traMADol, ketoconazole, BIOFLEXOR, azelastine, levothyroxine, predniSONE, cyanocobalamin, SYMBICORT, omeprazole, clotrimazole-betamethasone, senna, THEREMS-M, methotrexate, montelukast, alendronate, calcium-vitamin D, docusate sodium, folic acid, and [id number].  Meds ordered this encounter  Medications  . predniSONE (DELTASONE) 10 MG tablet    Sig: 6 tablets  Daily for 3 days,  Then taper by 1 tablet daily until gone    Dispense:  33 tablet    Refill:  0  . benzonatate (TESSALON) 200 MG capsule    Sig: TAKE 1 CAPSULE BY MOUTH 3 TIMES  Daily for 10 days    Dispense:  30 capsule    Refill:  1  . levalbuterol (XOPENEX) 0.63 MG/3ML nebulizer solution    Sig: Take 3 mLs (0.63 mg total) by nebulization every 6 (six) hours as needed for wheezing or shortness of breath.    Dispense:  3 mL    Refill:  1  . dextromethorphan 15 MG/5ML syrup    Sig: Take 10 mLs (30 mg total) by mouth 4 (four) times daily as needed for cough.    Dispense:  120 mL    Refill:  0  . azithromycin (ZITHROMAX) 250 MG tablet    Sig: 2 tablets one day 1,  One tablet daily until gone    Dispense:  6 tablet    Refill:  0    Medications Discontinued During This Encounter  Medication Reason  . predniSONE (DELTASONE) 20 MG tablet   . benzonatate (TESSALON) 200 MG capsule Reorder  . levalbuterol (XOPENEX) 0.63 MG/3ML nebulizer solution     Follow-up: Return in about 1 week  (around 06/23/2018).   Crecencio Mc, MD

## 2018-06-16 NOTE — Patient Instructions (Signed)
You are still wheezing quite a bit.   Stop the current prednisone dose of 40 mg daily  Start the NEW prednisone  Regimen as follows:  6   10 mg tablets  Daily  (60 mg total)  for 3 days,    Then taper by 1 tablet daily until gone  Adding azithromycin antibiotic for 5 days   Increase the frequency of nebulized Xopenex to every 6 hours . Make sure the last treatment is right before bedtime  Schedule the tessalon perles (cough capsules ( every 8 hours) and use the Robitussin in between (separate by 4 hours)

## 2018-06-18 ENCOUNTER — Encounter: Payer: Self-pay | Admitting: Internal Medicine

## 2018-06-18 NOTE — Assessment & Plan Note (Signed)
She feels generally better but continues to have wheezing on exam and cough reported by caregiver.  Will repeat prednisone with a longer treatment (60 mg qd x 3, , then 10 mg taper daily ) ,  Adding azithromycin , scheduleing nebulized treatments every 6 hours  And refill cough suppressant .

## 2018-06-23 ENCOUNTER — Other Ambulatory Visit: Payer: Self-pay | Admitting: Internal Medicine

## 2018-06-23 ENCOUNTER — Ambulatory Visit (INDEPENDENT_AMBULATORY_CARE_PROVIDER_SITE_OTHER): Payer: Medicare Other | Admitting: Internal Medicine

## 2018-06-23 ENCOUNTER — Encounter: Payer: Self-pay | Admitting: Internal Medicine

## 2018-06-23 ENCOUNTER — Ambulatory Visit (INDEPENDENT_AMBULATORY_CARE_PROVIDER_SITE_OTHER): Payer: Medicare Other

## 2018-06-23 VITALS — BP 98/60 | HR 72 | Temp 98.1°F | Resp 16 | Ht <= 58 in | Wt 160.6 lb

## 2018-06-23 DIAGNOSIS — R0781 Pleurodynia: Secondary | ICD-10-CM | POA: Diagnosis not present

## 2018-06-23 DIAGNOSIS — J4541 Moderate persistent asthma with (acute) exacerbation: Secondary | ICD-10-CM

## 2018-06-23 NOTE — Progress Notes (Signed)
Subjective:  Patient ID: Cindy Oconnor, female    DOB: [birth year]  Age: 48 y.o. MRN: [id number]  CC: The primary encounter diagnosis was Moderate persistent asthma with acute exacerbation. A diagnosis of Rib pain on left side was also pertinent to this visit.  HPI Cindy Oconnor presents for one week FOLLOW UP ON asthma exacerbation.  Patient was seen on Jan 17 for one week follow up n asthma exacerbation    Chest x ray was negative on initial presentation Jan 10 except for hyperinflation and chronic  right  pleural thickening without effusion  .  On Jan 17  prednisone taper was repeated along with 3 days of high dose present for management of  persistent significant wheezing , and azithromycin x 5 days added.she was givne dextromethorphan and tessalon perles for cough suppression and advised to use Xopenex every 6 hours .  She presents today reporting rib on the left side with cough or deep inspiration .  She reports  that she is stlil wheezing,  And demonstrates her symptoms,  which is NOT WHEEZING   Outpatient Medications Prior to Visit  Medication Sig Dispense Refill  . acetaminophen (TYLENOL) 325 MG tablet Take 2 tablets (650 mg total) by mouth 4 (four) times daily. 30 tablet 5  . alendronate (FOSAMAX) 70 MG tablet TAKE 1 TABLET BY MOUTH WEEKLY EARLY MORNING BEFORE FOOD/MEDS WITH WATER DO NOT LIE DOWN FOR 30 MINUTES 4 tablet 5  . azelastine (OPTIVAR) 0.05 % ophthalmic solution Place 1 drop into both eyes 2 (two) times daily. 6 mL 12  . azithromycin (ZITHROMAX) 250 MG tablet 2 tablets one day 1,  One tablet daily until gone 6 tablet 0  . benzonatate (TESSALON) 200 MG capsule TAKE 1 CAPSULE BY MOUTH 3 TIMES  Daily for 10 days 30 capsule 1  . calcium-vitamin D (OSCAL WITH D) 500-200 MG-UNIT TABS tablet TAKEK 1 TABLET BY MOUTH EVERY DAY FOR SUPPLEMENT 30 each 2  . clotrimazole-betamethasone (LOTRISONE) cream APPLY TOPICALLY 2 TIMES PER DAY 45 g 2  . cyanocobalamin (,VITAMIN B-12,)  1000 MCG/ML injection GIVE 1000 MCG INJECTION "IM" ONCE A MONTH (B-12 SUPPLEMENT) 1 mL 11  . D3-1000 25 MCG (1000 UT) tablet TAKEK 1 TABLET BY MOUTH EVERY DAY FOR SUPPLEMENT 30 tablet 5  . dextromethorphan 15 MG/5ML syrup Take 10 mLs (30 mg total) by mouth 4 (four) times daily as needed for cough. 120 mL 0  . docusate sodium (COLACE) 100 MG capsule TAKE 1 CAPSULE BY MOUTH EVERY OTHER DAY 15 capsule 2  . folic acid (FOLVITE) 1 MG tablet TAKE 1 TABLET BY MOUTH EVERY DAY 30 tablet 2  . ketoconazole (NIZORAL) 2 % cream Apply 1 application topically daily. 60 g 2  . lactulose (CHRONULAC) 10 GM/15ML solution Take 30 g by mouth 2 (two) times daily as needed.    . levalbuterol (XOPENEX) 0.63 MG/3ML nebulizer solution Take 3 mLs (0.63 mg total) by nebulization every 6 (six) hours as needed for wheezing or shortness of breath. 3 mL 1  . levothyroxine (SYNTHROID, LEVOTHROID) 125 MCG tablet TAKE 1 TABLET BY MOUTH EVERY MORNING ON AN EMPTY STOMACH WITH A GLASSOF WATER, 1 HOURS BEFORE BREAKFAST 30 tablet 10  . Liniments (BIOFLEXOR) 3 % GEL Apply to painful joints 2 to 3 times daily as needed 135 g 11  . magnesium hydroxide (MILK OF MAGNESIA) 400 MG/5ML suspension Take 30 mLs by mouth every 4 (four) hours as needed for mild constipation.    Marland Kitchen  methotrexate (RHEUMATREX) 2.5 MG tablet TAKE 7 TABLETS (17.5 MG) BY MOUTH ONCE AWEEK ON FRIDAY. **CAUTION: CHEMOTHERAPY.PROTECT FROM LIGHT** 28 tablet 3  . montelukast (SINGULAIR) 10 MG tablet TAKE 1 TABLET BY MOUTH AT BEDTIME 30 tablet 3  . Multiple Vitamins-Minerals (THEREMS-M) TABS TAKEK 1 TABLET BY MOUTH EVERY DAY FOR SUPPLEMENT 90 tablet 1  . ondansetron (ZOFRAN) 4 MG tablet Take 1 tablet (4 mg total) by mouth every 8 (eight) hours as needed for nausea or vomiting. 20 tablet 5  . predniSONE (DELTASONE) 10 MG tablet 6 tablets  Daily for 3 days,   Then taper by 1 tablet daily until gone 33 tablet 0  . predniSONE (DELTASONE) 2.5 MG tablet TAKE 1 TABLET BY MOUTH EACH DAY 30  tablet 11  . senna (SENOKOT) 8.6 MG TABS tablet Take 2 tablets (17.2 mg total) by mouth at bedtime. 120 tablet 11  . SYMBICORT 160-4.5 MCG/ACT inhaler INHALE 2 PUFFS ONCE DAILY (1-2 MINUTES BETWEEN PUFFS) 10.2 g 11  . Syringe/Needle, Disp, (SYRINGE 3CC/25GX1") 25G X 1" 3 ML MISC Use for b12 injections 50 each 0  . traMADol (ULTRAM) 50 MG tablet Take 50 mg by mouth every 6 (six) hours as needed for moderate pain.    Marland Kitchen omeprazole (PRILOSEC) 20 MG capsule TAKE 1 CAPSULE BY MOUTH 2 TIMES PER DAY **DO NOT CRUSH** 60 capsule 5   No facility-administered medications prior to visit.     Review of Systems;  Patient denies headache, fevers, malaise, unintentional weight loss, skin rash, eye pain, sinus congestion and sinus pain, sore throat, dysphagia,  hemoptysis ,  dyspnea,  palpitations, orthopnea, edema, abdominal pain, nausea, melena, diarrhea, constipation, flank pain, dysuria, hematuria, urinary  Frequency, nocturia, numbness, tingling, seizures,  Focal weakness, Loss of consciousness,  Tremor, insomnia, depression, anxiety, and suicidal ideation.      Objective:  BP 98/60 (BP Location: Left Arm, Patient Position: Sitting, Cuff Size: Normal)   Pulse 72   Temp 98.1 F (36.7 C) (Oral)   Resp 16   Ht 4' 9"  (1.448 m)   Wt 160 lb 9.6 oz (72.8 kg)   LMP 04/11/1981   SpO2 97%   BMI 34.75 kg/m   BP Readings from Last 3 Encounters:  06/23/18 98/60  06/16/18 102/66  06/09/18 98/78    Wt Readings from Last 3 Encounters:  06/23/18 160 lb 9.6 oz (72.8 kg)  06/16/18 165 lb 6.4 oz (75 kg)  06/09/18 167 lb 6.4 oz (75.9 kg)    General appearance: alert, cooperative and appears stated age Ears: normal TM's and external ear canals both ears Throat: lips, mucosa, and tongue normal; teeth and gums normal Neck: no adenopathy, no carotid bruit, supple, symmetrical, trachea midline and thyroid not enlarged, symmetric, no tenderness/mass/nodules Back: symmetric, no curvature. ROM normal. No CVA  tenderness. Lungs: clear to auscultation bilaterally. NOT WHEEZING  Heart: regular rate and rhythm, S1, S2 normal, no murmur, click, rub or gallop Abdomen: soft, non-tender; bowel sounds normal; no masses,  no organomegaly Pulses: 2+ and symmetric Skin: Skin color, texture, turgor normal. No rashes or lesions Lymph nodes: Cervical, supraclavicular, and axillary nodes normal.  Lab Results  Component Value Date   HGBA1C 5.5 12/28/2017   HGBA1C 5.7 06/27/2013    Lab Results  Component Value Date   CREATININE 0.85 12/28/2017   CREATININE 0.84 10/07/2017   CREATININE 0.82 07/28/2017    Lab Results  Component Value Date   WBC 6.9 12/28/2017   HGB 12.7 12/28/2017   HCT 38.5  12/28/2017   PLT 368.0 12/28/2017   GLUCOSE 100 (H) 12/28/2017   CHOL 174 05/19/2015   TRIG 97.0 05/19/2015   HDL 47.50 05/19/2015   LDLCALC 107 (H) 05/19/2015   ALT 14 12/28/2017   AST 25 12/28/2017   NA 141 12/28/2017   K 3.7 12/28/2017   CL 105 12/28/2017   CREATININE 0.85 12/28/2017   BUN 15 12/28/2017   CO2 30 12/28/2017   TSH 2.93 12/28/2017   INR 0.95 07/28/2017   HGBA1C 5.5 12/28/2017    Mm 3d Screen Breast Bilateral  Result Date: 10/11/2017 CLINICAL DATA:  Screening. EXAM: DIGITAL SCREENING BILATERAL MAMMOGRAM WITH TOMO AND CAD COMPARISON:  Previous exam(s). ACR Breast Density Category b: There are scattered areas of fibroglandular density. FINDINGS: There are no findings suspicious for malignancy. Images were processed with CAD. IMPRESSION: No mammographic evidence of malignancy. A result letter of this screening mammogram will be mailed directly to the patient. RECOMMENDATION: Screening mammogram in one year. (Code:SM-B-01Y) BI-RADS CATEGORY  1: Negative. Electronically Signed   By: Curlene Dolphin M.D.   On: 10/11/2017 12:37    Assessment & Plan:   Problem List Items Addressed This Visit    Moderate persistent asthma with acute exacerbation - Primary    Now resolved.  Her "wheezing" is  actually a demonstration fo vocal cord dysfunction  .  Continue symbicort, reduce frequency of nebs and cough suppressants to every 8 hours for the next 2 weeks,  Then stop       Rib pain on left side    Unilateral rib films were negative today for fractures   .        Relevant Orders   DG Ribs Unilateral Left (Completed)      I am having Aymara L. Frappier maintain her ondansetron, acetaminophen, SYRINGE 3CC/25GX1", lactulose, magnesium hydroxide, traMADol, ketoconazole, BIOFLEXOR, azelastine, levothyroxine, predniSONE, cyanocobalamin, SYMBICORT, clotrimazole-betamethasone, senna, THEREMS-M, methotrexate, montelukast, alendronate, calcium-vitamin D, docusate sodium, folic acid, Z6-1096, predniSONE, benzonatate, levalbuterol, dextromethorphan, and azithromycin.  No orders of the defined types were placed in this encounter.   There are no discontinued medications.  Follow-up: No follow-ups on file.   Crecencio Mc, MD

## 2018-06-23 NOTE — Patient Instructions (Signed)
Continue giving  benzonatate (Tessalon) couch capsules to every 8 hours for 2 weeks,  Then reduce use to every 8 hours as needed for cough   Reduce Xopenex nebulizers to every 8 hours (scheduled) for 2 weeks,,  Then stop  Resume prednisone 2.5 mg daily starting on June 24 2018   May return to [place] on Jun 26 2018   x ray   Today to rule out rib fracture on the left

## 2018-06-25 DIAGNOSIS — R0781 Pleurodynia: Secondary | ICD-10-CM | POA: Insufficient documentation

## 2018-06-25 NOTE — Assessment & Plan Note (Signed)
Unilateral rib films were negative today for fractures   .

## 2018-06-25 NOTE — Assessment & Plan Note (Signed)
Now resolved.  Her "wheezing" is actually a demonstration fo vocal cord dysfunction  .  Continue symbicort, reduce frequency of nebs and cough suppressants to every 8 hours for the next 2 weeks,  Then stop

## 2018-07-03 ENCOUNTER — Ambulatory Visit: Payer: Medicare Other | Admitting: Internal Medicine

## 2018-08-10 ENCOUNTER — Other Ambulatory Visit: Payer: Self-pay | Admitting: Internal Medicine

## 2018-08-12 IMAGING — DX DG CHEST 2V
2 series · 2 of 2 positions shown · non-contrast
Comparison: Chest x-ray 02/20/2016, chest CT 06/27/2013

CLINICAL DATA: Moderate persistent asthma with acute exacerbation.
Cough and short of breath

EXAM:
CHEST  2 VIEW

[chest pa]
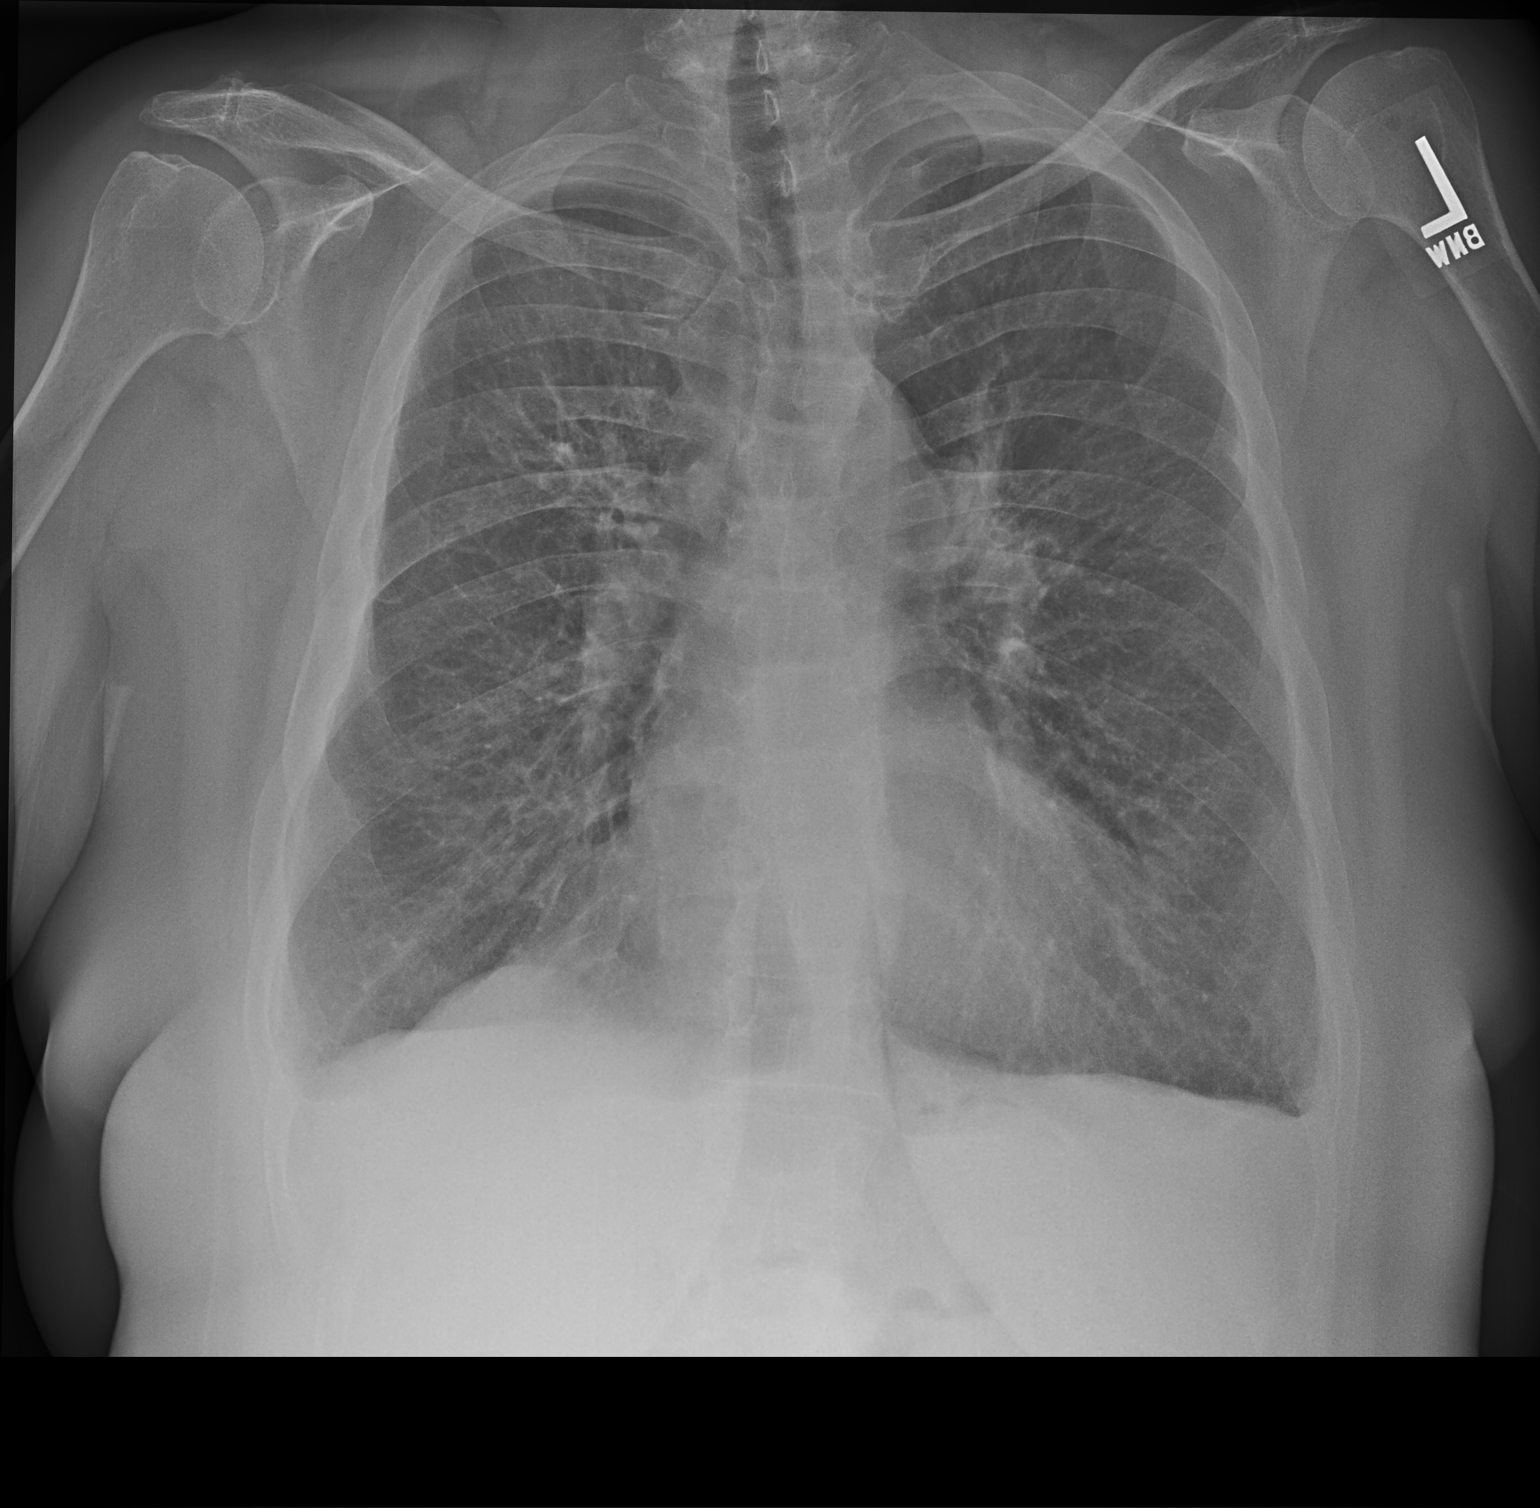

[chest lat]
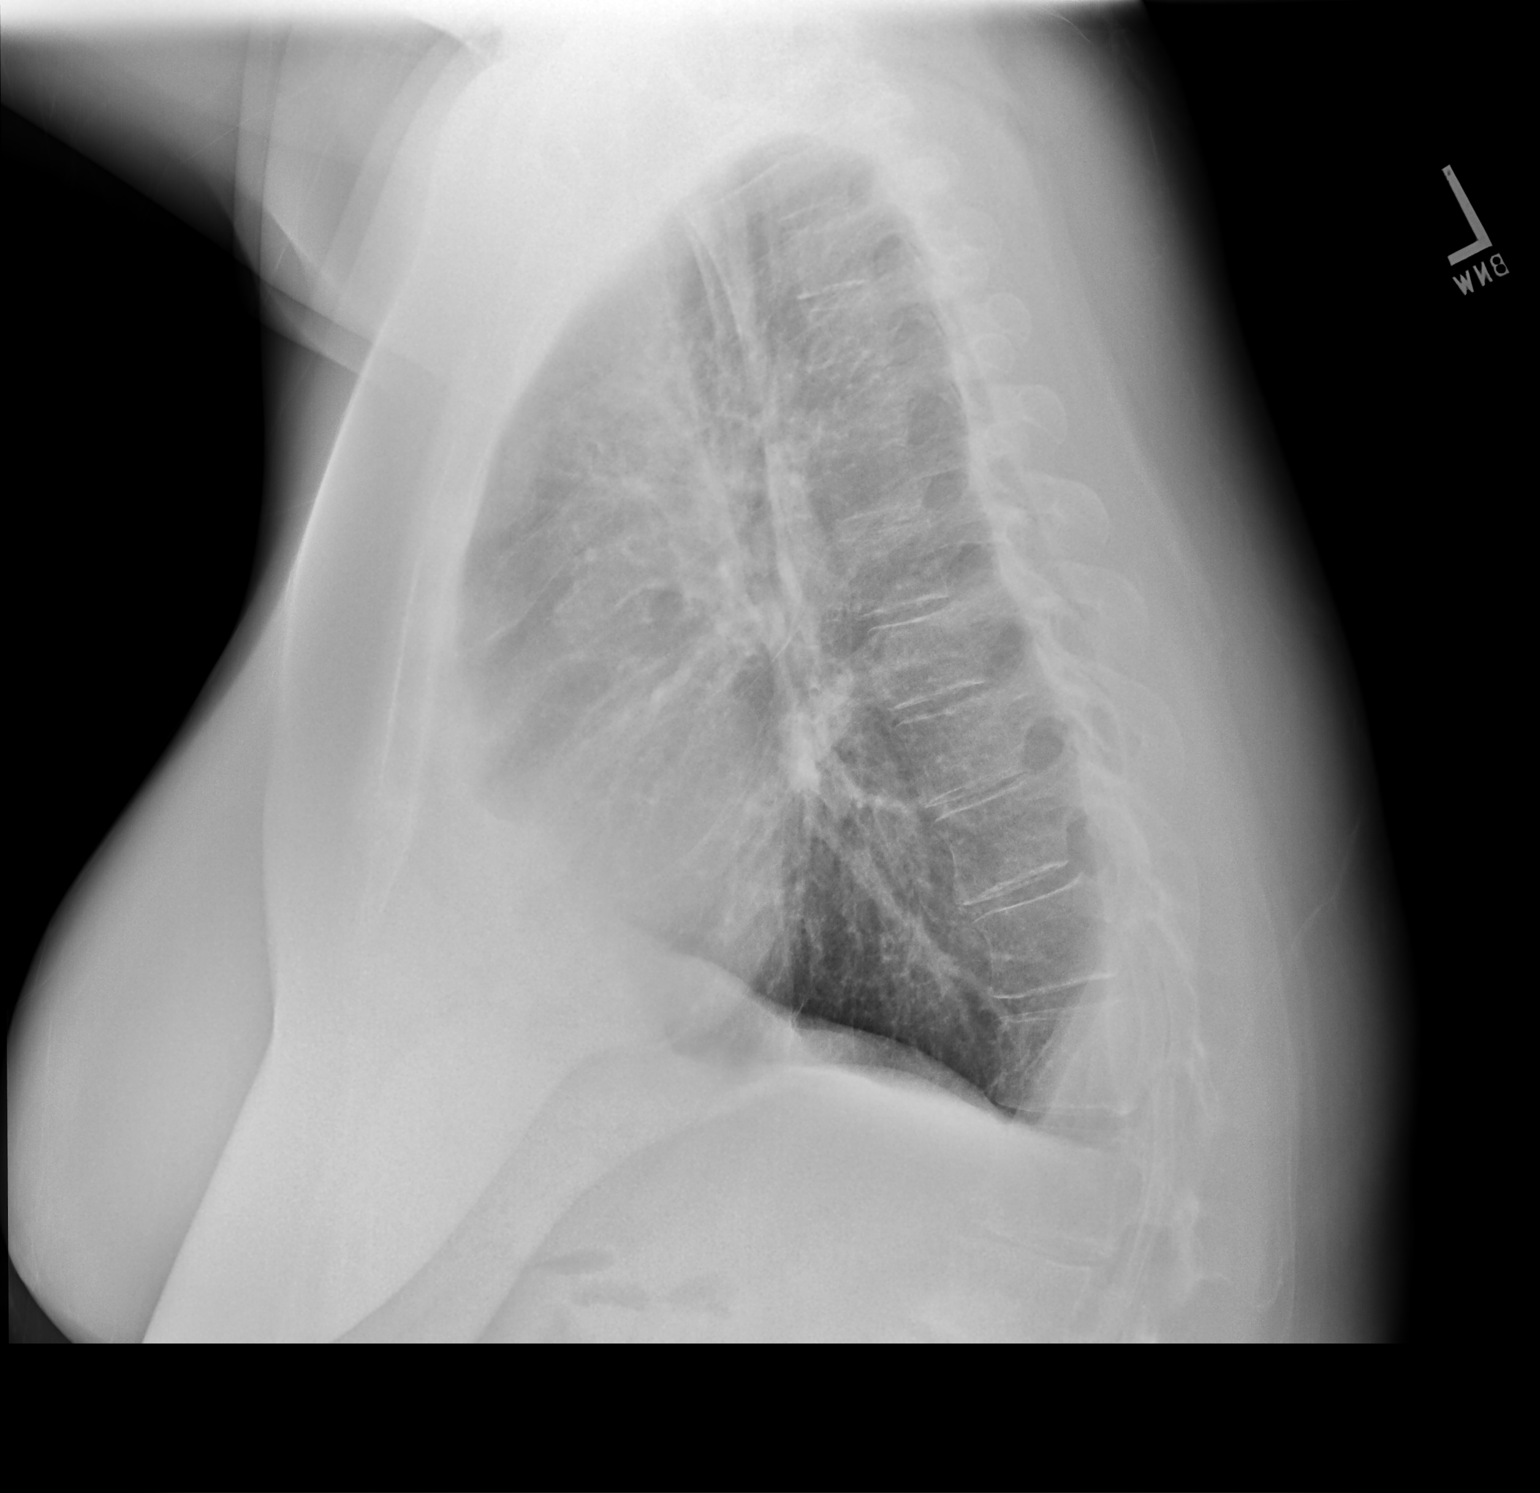

[2 of 2 positions shown; findings below may reference images not displayed]

FINDINGS: Heart size and vascularity normal. Negative for pneumonia or heart
failure. Negative for mass lesion. Prominent extrapleural fat in the
posterior lung bases bilaterally, right greater than left is
unchanged from prior studies. No interval acute abnormality
identified.
IMPRESSION: No active cardiopulmonary disease.

## 2018-08-15 DIAGNOSIS — Z96653 Presence of artificial knee joint, bilateral: Secondary | ICD-10-CM | POA: Diagnosis not present

## 2018-08-15 DIAGNOSIS — M170 Bilateral primary osteoarthritis of knee: Secondary | ICD-10-CM | POA: Diagnosis not present

## 2018-08-17 ENCOUNTER — Other Ambulatory Visit: Payer: Self-pay

## 2018-08-17 ENCOUNTER — Encounter: Payer: Self-pay | Admitting: Internal Medicine

## 2018-08-17 ENCOUNTER — Ambulatory Visit (INDEPENDENT_AMBULATORY_CARE_PROVIDER_SITE_OTHER): Payer: Medicare Other | Admitting: Internal Medicine

## 2018-08-17 VITALS — BP 110/76 | HR 77 | Temp 97.5°F | Resp 15 | Ht <= 58 in | Wt 162.2 lb

## 2018-08-17 DIAGNOSIS — R7301 Impaired fasting glucose: Secondary | ICD-10-CM | POA: Diagnosis not present

## 2018-08-17 DIAGNOSIS — E034 Atrophy of thyroid (acquired): Secondary | ICD-10-CM | POA: Diagnosis not present

## 2018-08-17 DIAGNOSIS — Z0000 Encounter for general adult medical examination without abnormal findings: Secondary | ICD-10-CM | POA: Diagnosis not present

## 2018-08-17 DIAGNOSIS — M818 Other osteoporosis without current pathological fracture: Secondary | ICD-10-CM | POA: Diagnosis not present

## 2018-08-17 DIAGNOSIS — E669 Obesity, unspecified: Secondary | ICD-10-CM

## 2018-08-17 LAB — COMPREHENSIVE METABOLIC PANEL
ALT: 13 U/L (ref 0–35)
AST: 25 U/L (ref 0–37)
Albumin: 3.6 g/dL (ref 3.5–5.2)
Alkaline Phosphatase: 59 U/L (ref 39–117)
BUN: 13 mg/dL (ref 6–23)
CO2: 30 mEq/L (ref 19–32)
Calcium: 9.1 mg/dL (ref 8.4–10.5)
Chloride: 104 mEq/L (ref 96–112)
Creatinine, Ser: 0.8 mg/dL (ref 0.40–1.20)
GFR: 75.93 mL/min (ref >60.00)
Glucose, Bld: 99 mg/dL (ref 70–99)
Potassium: 4.2 mEq/L (ref 3.5–5.1)
Sodium: 141 mEq/L (ref 135–145)
TOTAL PROTEIN: 6.8 g/dL (ref 6.0–8.3)
Total Bilirubin: 0.4 mg/dL (ref 0.2–1.2)

## 2018-08-17 LAB — HEMOGLOBIN A1C: Hgb A1c MFr Bld: 5.6 % (ref 4.6–6.5)

## 2018-08-17 LAB — TSH: TSH: 3.26 u[IU]/mL (ref 0.35–4.50)

## 2018-08-17 NOTE — Progress Notes (Signed)
Subjective:  Patient ID: Cindy Oconnor, female    DOB: 05-27-1971  Age: 48 y.o. MRN: [id number]  CC: The primary encounter diagnosis was Obesity (BMI 35.0-39.9 without comorbidity). Diagnoses of Hypothyroidism due to acquired atrophy of thyroid, Impaired fasting glucose, Routine general medical examination at a health care facility, and Other osteoporosis without current pathological fracture were also pertinent to this visit.  HPI Cindy Oconnor presents for follow up on chronic conditions including, hypothyroidism  , OA/RA, and asthma. No complaints today except  RIGHT POSTERIOR SHOULDER PAIN .  She is a middle aged white female with Down's syndrome and lives in a group home.  She is accompanied by an aid .  She is IADLs but does not drive or manage her finances.  Has lost 9 lbs since last July. Has been more active since her knee replacement   Saw Ortho on march 16 ([facility]) for TKA follow up   . Using diclofenac  and tylenol   Seeing podiatry  seeing rheum at [facility] reordered (orered Augst 2019 here but not done) on alendroate   Lab Results  Component Value Date   TSH 3.26 08/17/2018     Outpatient Medications Prior to Visit  Medication Sig Dispense Refill  . acetaminophen (TYLENOL) 325 MG tablet Take 2 tablets (650 mg total) by mouth 4 (four) times daily. 30 tablet 5  . alendronate (FOSAMAX) 70 MG tablet TAKE 1 TABLET BY MOUTH WEEKLY EARLY MORNING BEFORE FOOD/MEDS WITH WATER DO NOT LIE DOWN FOR 30 MINUTES 4 tablet 5  . azelastine (OPTIVAR) 0.05 % ophthalmic solution Place 1 drop into both eyes 2 (two) times daily. 6 mL 12  . calcium-vitamin D (OSCAL WITH D) 500-200 MG-UNIT TABS tablet TAKEK 1 TABLET BY MOUTH EVERY DAY FOR SUPPLEMENT 30 each 2  . clotrimazole-betamethasone (LOTRISONE) cream APPLY TOPICALLY 2 TIMES PER DAY 45 g 2  . cyanocobalamin (,VITAMIN B-12,) 1000 MCG/ML injection GIVE 1000 MCG INJECTION "IM" ONCE A MONTH (B-12 SUPPLEMENT) 1 mL 11  . D3-1000 25 MCG  (1000 UT) tablet TAKEK 1 TABLET BY MOUTH EVERY DAY FOR SUPPLEMENT 30 tablet 5  . diclofenac sodium (VOLTAREN) 1 % GEL Apply 2 g topically 4 (four) times daily.    Marland Kitchen docusate sodium (COLACE) 100 MG capsule TAKE 1 CAPSULE BY MOUTH EVERY OTHER DAY 15 capsule 2  . folic acid (FOLVITE) 1 MG tablet TAKE 1 TABLET BY MOUTH EVERY DAY 30 tablet 2  . ketoconazole (NIZORAL) 2 % cream Apply 1 application topically daily. 60 g 2  . levalbuterol (XOPENEX) 0.63 MG/3ML nebulizer solution Take 3 mLs (0.63 mg total) by nebulization every 6 (six) hours as needed for wheezing or shortness of breath. 3 mL 1  . Liniments (BIOFLEXOR) 3 % GEL Apply to painful joints 2 to 3 times daily as needed 135 g 11  . methotrexate (RHEUMATREX) 2.5 MG tablet TAKE 7 TABLETS (17.5 MG) BY MOUTH ONCE AWEEK ON FRIDAY. **CAUTION: CHEMOTHERAPY.PROTECT FROM LIGHT** 28 tablet 3  . montelukast (SINGULAIR) 10 MG tablet TAKE 1 TABLET BY MOUTH AT BEDTIME 30 tablet 3  . Multiple Vitamins-Minerals (THEREMS-M) TABS TAKEK 1 TABLET BY MOUTH EVERY DAY FOR SUPPLEMENT 90 tablet 1  . omeprazole (PRILOSEC) 20 MG capsule TAKE 1 CAPSULE BY MOUTH 2 TIMES PER DAY **DO NOT CRUSH** 60 capsule 5  . predniSONE (DELTASONE) 2.5 MG tablet TAKE 1 TABLET BY MOUTH EACH DAY 30 tablet 11  . senna (SENOKOT) 8.6 MG TABS tablet Take 2 tablets (17.2 mg  total) by mouth at bedtime. 120 tablet 11  . SYMBICORT 160-4.5 MCG/ACT inhaler INHALE 2 PUFFS ONCE DAILY (1-2 MINUTES BETWEEN PUFFS) 10.2 g 11  . Syringe/Needle, Disp, (SYRINGE 3CC/25GX1") 25G X 1" 3 ML MISC Use for b12 injections 50 each 0  . traMADol (ULTRAM) 50 MG tablet Take 50 mg by mouth every 6 (six) hours as needed for moderate pain.    Marland Kitchen levothyroxine (SYNTHROID, LEVOTHROID) 125 MCG tablet TAKE 1 TABLET BY MOUTH EVERY MORNING ON AN EMPTY STOMACH WITH A GLASSOF WATER, 1 HOURS BEFORE BREAKFAST 30 tablet 10  . lactulose (CHRONULAC) 10 GM/15ML solution Take 30 g by mouth 2 (two) times daily as needed.    . magnesium  hydroxide (MILK OF MAGNESIA) 400 MG/5ML suspension Take 30 mLs by mouth every 4 (four) hours as needed for mild constipation.    . ondansetron (ZOFRAN) 4 MG tablet Take 1 tablet (4 mg total) by mouth every 8 (eight) hours as needed for nausea or vomiting. (Patient not taking: Reported on 08/17/2018) 20 tablet 5  . azithromycin (ZITHROMAX) 250 MG tablet 2 tablets one day 1,  One tablet daily until gone (Patient not taking: Reported on 08/17/2018) 6 tablet 0  . benzonatate (TESSALON) 200 MG capsule TAKE 1 CAPSULE BY MOUTH 3 TIMES  Daily for 10 days (Patient not taking: Reported on 08/17/2018) 30 capsule 1  . GNP TUSSIN COUGH LONG ACTING 15 MG/5ML syrup TAKE 2 TEASPOONSFUL (10 ML) BY MOUTH 4 TIMES DAILY AS NEEDED FOR COUGH. (Patient not taking: Reported on 08/17/2018) 120 mL 0  . predniSONE (DELTASONE) 10 MG tablet 6 tablets  Daily for 3 days,   Then taper by 1 tablet daily until gone (Patient not taking: Reported on 08/17/2018) 33 tablet 0   No facility-administered medications prior to visit.     Review of Systems;  Patient denies headache, fevers, malaise, unintentional weight loss, skin rash, eye pain, sinus congestion and sinus pain, sore throat, dysphagia,  hemoptysis , cough, dyspnea, wheezing, chest pain, palpitations, orthopnea, edema, abdominal pain, nausea, melena, diarrhea, constipation, flank pain, dysuria, hematuria, urinary  Frequency, nocturia, numbness, tingling, seizures,  Focal weakness, Loss of consciousness,  Tremor, insomnia, depression, anxiety, and suicidal ideation.      Objective:  BP 110/76 (BP Location: Left Arm, Patient Position: Sitting, Cuff Size: Normal)   Pulse 77   Temp (!) 97.5 F (36.4 C) (Oral)   Resp 15   Ht 4' 9"  (1.448 m)   Wt 162 lb 3.2 oz (73.6 kg)   LMP 04/11/1981   SpO2 96%   BMI 35.10 kg/m   BP Readings from Last 3 Encounters:  08/17/18 110/76  06/23/18 98/60  06/16/18 102/66    Wt Readings from Last 3 Encounters:  08/17/18 162 lb 3.2 oz (73.6  kg)  06/23/18 160 lb 9.6 oz (72.8 kg)  06/16/18 165 lb 6.4 oz (75 kg)    General appearance: alert, cooperative and appears stated age Ears: normal TM's and external ear canals both ears Throat: lips, mucosa, and tongue normal; teeth and gums normal Neck: no adenopathy, no carotid bruit, supple, symmetrical, trachea midline and thyroid not enlarged, symmetric, no tenderness/mass/nodules Back: symmetric, no curvature. ROM normal. No CVA tenderness. Lungs: clear to auscultation bilaterally Heart: regular rate and rhythm, S1, S2 normal, no murmur, click, rub or gallop Abdomen: soft, non-tender; bowel sounds normal; no masses,  no organomegaly Pulses: 2+ and symmetric Skin: Skin color, texture, turgor normal. No rashes or lesions Lymph nodes: Cervical, supraclavicular, and axillary  nodes normal.  Lab Results  Component Value Date   HGBA1C 5.6 08/17/2018   HGBA1C 5.5 12/28/2017   HGBA1C 5.7 06/27/2013    Lab Results  Component Value Date   CREATININE 0.80 08/17/2018   CREATININE 0.85 12/28/2017   CREATININE 0.84 10/07/2017    Lab Results  Component Value Date   WBC 6.9 12/28/2017   HGB 12.7 12/28/2017   HCT 38.5 12/28/2017   PLT 368.0 12/28/2017   GLUCOSE 99 08/17/2018   CHOL 174 05/19/2015   TRIG 97.0 05/19/2015   HDL 47.50 05/19/2015   LDLCALC 107 (H) 05/19/2015   ALT 13 08/17/2018   AST 25 08/17/2018   NA 141 08/17/2018   K 4.2 08/17/2018   CL 104 08/17/2018   CREATININE 0.80 08/17/2018   BUN 13 08/17/2018   CO2 30 08/17/2018   TSH 3.26 08/17/2018   INR 0.95 07/28/2017   HGBA1C 5.6 08/17/2018    Mm 3d Screen Breast Bilateral  Result Date: 10/11/2017 CLINICAL DATA:  Screening. EXAM: DIGITAL SCREENING BILATERAL MAMMOGRAM WITH TOMO AND CAD COMPARISON:  Previous exam(s). ACR Breast Density Category b: There are scattered areas of fibroglandular density. FINDINGS: There are no findings suspicious for malignancy. Images were processed with CAD. IMPRESSION: No  mammographic evidence of malignancy. A result letter of this screening mammogram will be mailed directly to the patient. RECOMMENDATION: Screening mammogram in one year. (Code:SM-B-01Y) BI-RADS CATEGORY  1: Negative. Electronically Signed   By: Curlene Dolphin M.D.   On: 10/11/2017 12:37    Assessment & Plan:   Problem List Items Addressed This Visit    Routine general medical examination at a health care facility    age appropriate education and counseling updated, referrals for preventative services and immunizations addressed, dietary and smoking counseling addressed, most recent labs reviewed.  I have personally reviewed and have noted:  1) the patient's medical and social history 2) The pt's use of alcohol, tobacco, and illicit drugs 3) The patient's current medications and supplements 4) Functional ability including ADL's, fall risk, home safety risk, hearing and visual impairment 5) Diet and physical activities 6) Evidence for depression or mood disorder 7) The patient's height, weight, and BMI have been recorded in the chart  I have made referrals, and provided counseling and education based on review of the above      Osteoporosis    Managed with weekly alendronate since  2012.  History of fractures in 2006 .  Length of bisphosphonate therapy approaching the end, with  no recent DEXA.  Will assess bone density this year.      Obesity (BMI 35.0-39.9 without comorbidity) - Primary   Relevant Orders   Comprehensive metabolic panel (Completed)   Hypothyroid   Relevant Orders   TSH (Completed)    Other Visit Diagnoses    Impaired fasting glucose       Relevant Orders   Hemoglobin A1c (Completed)      I have discontinued Zienna L. Lienemann's benzonatate, azithromycin, and GNP Tussin Cough Long Acting. I am also having her maintain her ondansetron, acetaminophen, SYRINGE 3CC/25GX1", lactulose, magnesium hydroxide, traMADol, ketoconazole, Bioflexor, azelastine, predniSONE,  cyanocobalamin, Symbicort, clotrimazole-betamethasone, senna, Therems-M, methotrexate, montelukast, alendronate, calcium-vitamin D, docusate sodium, folic acid, Q3-3007, levalbuterol, omeprazole, and diclofenac sodium.  No orders of the defined types were placed in this encounter.   Medications Discontinued During This Encounter  Medication Reason  . azithromycin (ZITHROMAX) 250 MG tablet Completed Course  . benzonatate (TESSALON) 200 MG capsule Completed Course  .  GNP TUSSIN COUGH LONG ACTING 15 MG/5ML syrup Completed Course  . predniSONE (DELTASONE) 10 MG tablet Completed Course    Follow-up: Return in about 6 months (around 02/17/2019).   Crecencio Mc, MD

## 2018-08-17 NOTE — Patient Instructions (Signed)
You have lost 9 lbs since last July!!!  Congratulations !!!  Keep up the good work!  Dr Fredonia Highland wants you to have a bone density test and has ordered that for you

## 2018-08-18 ENCOUNTER — Other Ambulatory Visit: Payer: Self-pay | Admitting: Internal Medicine

## 2018-08-18 MED ORDER — LEVOTHYROXINE SODIUM 125 MCG PO TABS: 125.0000 ug | ORAL_TABLET | Freq: Every day | ORAL | 10 refills | Status: DC

## 2018-08-19 NOTE — Assessment & Plan Note (Addendum)
She has been on methotrexate since at least 2005 and has had progressive joint deformity of the hands and feet. IN addition, she has OA of the hands, hips and knees. She has had Cro[place] joint injections as well as trigger finger injections in the past. Has foot pain and follows with podiatry for callosus. From a UC standpoint, no recent flares and no diarrhea or abdominal pain. She is on long term low dose prednisone 2.5 mg dail  Lab Results  Component Value Date   WBC 6.9 12/28/2017   HGB 12.7 12/28/2017   HCT 38.5 12/28/2017   MCV 104.1 (H) 12/28/2017   PLT 368.0 12/28/2017   Lab Results  Component Value Date   ALT 13 08/17/2018   AST 25 08/17/2018   ALKPHOS 59 08/17/2018   BILITOT 0.4 08/17/2018

## 2018-08-19 NOTE — Assessment & Plan Note (Signed)
Managed with weekly alendronate since  2012.  History of fractures in 2006 .  Length of bisphosphonate therapy approaching the end, with  no recent DEXA.  Will assess bone density this year.

## 2018-08-19 NOTE — Assessment & Plan Note (Signed)

## 2018-08-21 DIAGNOSIS — M79674 Pain in right toe(s): Secondary | ICD-10-CM | POA: Diagnosis not present

## 2018-08-21 DIAGNOSIS — M79675 Pain in left toe(s): Secondary | ICD-10-CM | POA: Diagnosis not present

## 2018-08-21 DIAGNOSIS — B351 Tinea unguium: Secondary | ICD-10-CM | POA: Diagnosis not present

## 2018-08-21 DIAGNOSIS — B353 Tinea pedis: Secondary | ICD-10-CM | POA: Diagnosis not present

## 2018-08-22 ENCOUNTER — Other Ambulatory Visit: Payer: Self-pay | Admitting: Internal Medicine

## 2018-09-21 ENCOUNTER — Other Ambulatory Visit: Payer: Self-pay | Admitting: Internal Medicine

## 2018-09-27 DIAGNOSIS — M2031 Hallux varus (acquired), right foot: Secondary | ICD-10-CM | POA: Diagnosis not present

## 2018-09-27 DIAGNOSIS — Q6689 Other  specified congenital deformities of feet: Secondary | ICD-10-CM | POA: Diagnosis not present

## 2018-09-27 DIAGNOSIS — M779 Enthesopathy, unspecified: Secondary | ICD-10-CM | POA: Diagnosis not present

## 2018-09-27 DIAGNOSIS — M79675 Pain in left toe(s): Secondary | ICD-10-CM | POA: Diagnosis not present

## 2018-09-27 DIAGNOSIS — M79674 Pain in right toe(s): Secondary | ICD-10-CM | POA: Diagnosis not present

## 2018-10-17 ENCOUNTER — Telehealth: Payer: Self-pay

## 2018-10-17 MED ORDER — SULFAMETHOXAZOLE-TRIMETHOPRIM 800-160 MG PO TABS: 1.0000 | ORAL_TABLET | Freq: Two times a day (BID) | ORAL | 0 refills | Status: DC

## 2018-10-17 NOTE — Telephone Encounter (Signed)
Spoke with Santiago Glad at the group home to let her know that the pt needs to start Septra asap and that she will need to take 1 tablet twice daily for 7 days. Santiago Glad gave a verbal understanding.

## 2018-10-17 NOTE — Telephone Encounter (Signed)
Called facility back spoke with [name], per [facility] patient has 2 boils located on right breast that have ruptured but are getting red again and another boil reported by Erasmo Downer to [facility] below her stomach.  When I ask facility could patient come in at 10/18/18 at 3:30, I was advised that would need to find out if facility would allow visit,. [name] called back to say patient could come in for appointment when I went to schedule att 10/18/18 at 3:30 [facility] stated "I might not have anyone that could bring her" advised [name] this first available appointment and that [place] can be very painful, and that the infection can spread, [name] stated well might get someone to bring her to appointment. Patient has been screened for COVID and in [place] scheduled.

## 2018-10-17 NOTE — Telephone Encounter (Signed)
She needs to start an antibiotic asap.  I have sent septra ds to [facility].

## 2018-10-17 NOTE — Telephone Encounter (Signed)
Copied from [place] [id number]. Topic: Appointment Scheduling - Scheduling Inquiry for Clinic >> Oct 16, 2018  4:40 PM Mathis Bud wrote: Reason for CRM: [name] from [facility] called stating she needs to make an appt for patient regarding patient has boils on private area and on right breast.   [facility] call back # [phone]

## 2018-10-17 NOTE — Telephone Encounter (Signed)
Copied from [place] [id number]. Topic: Appointment Scheduling - Scheduling Inquiry for Clinic >> Oct 16, 2018  4:40 PM Mathis Bud wrote: Reason for CRM: [name] from [place] called stating she needs to make an appt for patient regarding patient has boils on private area and on right breast.   [facility] call back # [phone]

## 2018-10-18 ENCOUNTER — Ambulatory Visit (INDEPENDENT_AMBULATORY_CARE_PROVIDER_SITE_OTHER): Payer: Medicare Other | Admitting: Internal Medicine

## 2018-10-18 ENCOUNTER — Other Ambulatory Visit: Payer: Self-pay

## 2018-10-18 ENCOUNTER — Encounter: Payer: Self-pay | Admitting: Internal Medicine

## 2018-10-18 DIAGNOSIS — L02229 Furuncle of trunk, unspecified: Secondary | ICD-10-CM | POA: Diagnosis not present

## 2018-10-18 DIAGNOSIS — Z7189 Other specified counseling: Secondary | ICD-10-CM | POA: Diagnosis not present

## 2018-10-18 NOTE — Progress Notes (Signed)
Subjective:  Patient ID: Cindy Oconnor, female    DOB: [birth year]  Age: 48 y.o. MRN: [id number]  CC: Diagnoses of Boil of trunk and Educated About Covid-19 Virus Infection were pertinent to this visit.  HPI Cindy Oconnor presents for evaluation and treatment of skin boils.  She states that she has had them in the past.  She cannot recall when she first noticed them , but she  notified a staff member at her group home residence  [facility] that she had several that were tender and had ruptured  And becoming "red" again.  The boils were described as being  located on her right breast and lower abdomen.  She has taken one dose of the  antibiotic I called in  Yesterday,   (Septra DS).  She denies fevers, joint pain and body aches.    She has been in "LockDown" at the group home since the Pandemic began, and has not seen her mother or brother in over 6 weeks. The patient has no signs or symptoms of COVID 19 infection (fever, cough, sore throat  or shortness of breath beyond what is typical for patient).  Patient denies contact with other persons with the above mentioned symptoms or with anyone confirmed to have COVID 19  . Outpatient Medications Prior to Visit  Medication Sig Dispense Refill  . acetaminophen (TYLENOL) 325 MG tablet Take 2 tablets (650 mg total) by mouth 4 (four) times daily. 30 tablet 5  . alendronate (FOSAMAX) 70 MG tablet TAKE 1 TABLET BY MOUTH WEEKLY EARLY MORNING BEFORE FOOD/MEDS WITH WATER DO NOT LIE DOWN FOR 30 MINUTES 4 tablet 5  . azelastine (OPTIVAR) 0.05 % ophthalmic solution Place 1 drop into both eyes 2 (two) times daily. 6 mL 12  . calcium-vitamin D (OSCAL WITH D) 500-200 MG-UNIT TABS tablet TAKEK 1 TABLET BY MOUTH EVERY DAY FOR SUPPLEMENT 30 each 2  . clotrimazole-betamethasone (LOTRISONE) cream APPLY TOPICALLY 2 TIMES PER DAY 45 g 2  . cyanocobalamin (,VITAMIN B-12,) 1000 MCG/ML injection GIVE 1000 MCG INJECTION "IM" ONCE A MONTH (B-12 SUPPLEMENT) 1 mL 11  .  D3-1000 25 MCG (1000 UT) tablet TAKEK 1 TABLET BY MOUTH EVERY DAY FOR SUPPLEMENT 30 tablet 5  . diclofenac sodium (VOLTAREN) 1 % GEL Apply 2 g topically 4 (four) times daily.    Marland Kitchen docusate sodium (COLACE) 100 MG capsule TAKE 1 CAPSULE BY MOUTH EVERY OTHER DAY 15 capsule 2  . folic acid (FOLVITE) 1 MG tablet TAKE 1 TABLET BY MOUTH EVERY DAY 30 tablet 2  . ketoconazole (NIZORAL) 2 % cream Apply 1 application topically daily. 60 g 2  . lactulose (CHRONULAC) 10 GM/15ML solution Take 30 g by mouth 2 (two) times daily as needed.    . levalbuterol (XOPENEX) 0.63 MG/3ML nebulizer solution Take 3 mLs (0.63 mg total) by nebulization every 6 (six) hours as needed for wheezing or shortness of breath. 3 mL 1  . levothyroxine (SYNTHROID, LEVOTHROID) 125 MCG tablet Take 1 tablet (125 mcg total) by mouth daily before breakfast. 30 tablet 10  . Liniments (BIOFLEXOR) 3 % GEL Apply to painful joints 2 to 3 times daily as needed 135 g 11  . magnesium hydroxide (MILK OF MAGNESIA) 400 MG/5ML suspension Take 30 mLs by mouth every 4 (four) hours as needed for mild constipation.    . methotrexate (RHEUMATREX) 2.5 MG tablet TAKE 7 TABLETS (17.5 MG) BY MOUTH ONCE AWEEK ON FRIDAY. **CAUTION: CHEMOTHERAPY.PROTECT FROM LIGHT** 28 tablet 3  . montelukast (  SINGULAIR) 10 MG tablet TAKE 1 TABLET BY MOUTH AT BEDTIME 30 tablet 3  . Multiple Vitamins-Minerals (THEREMS-M) TABS TAKEK 1 TABLET BY MOUTH EVERY DAY FOR SUPPLEMENT 90 tablet 1  . omeprazole (PRILOSEC) 20 MG capsule TAKE 1 CAPSULE BY MOUTH 2 TIMES PER DAY **DO NOT CRUSH** 60 capsule 5  . ondansetron (ZOFRAN) 4 MG tablet Take 1 tablet (4 mg total) by mouth every 8 (eight) hours as needed for nausea or vomiting. 20 tablet 5  . predniSONE (DELTASONE) 2.5 MG tablet TAKE 1 TABLET BY MOUTH EACH DAY 30 tablet 11  . senna (SENOKOT) 8.6 MG TABS tablet Take 2 tablets (17.2 mg total) by mouth at bedtime. 120 tablet 11  . sulfamethoxazole-trimethoprim (BACTRIM DS) 800-160 MG tablet Take 1  tablet by mouth 2 (two) times daily. 14 tablet 0  . SYMBICORT 160-4.5 MCG/ACT inhaler INHALE 2 PUFFS ONCE DAILY (1-2 MINUTES BETWEEN PUFFS) 10.2 g 11  . Syringe/Needle, Disp, (SYRINGE 3CC/25GX1") 25G X 1" 3 ML MISC Use for b12 injections 50 each 0  . traMADol (ULTRAM) 50 MG tablet Take 50 mg by mouth every 6 (six) hours as needed for moderate pain.     No facility-administered medications prior to visit.     Review of Systems;  Patient denies headache, fevers, malaise, unintentional weight loss, skin rash, eye pain, sinus congestion and sinus pain, sore throat, dysphagia,  hemoptysis , cough, dyspnea, wheezing, chest pain, palpitations, orthopnea, edema, abdominal pain, nausea, melena, diarrhea, constipation, flank pain, dysuria, hematuria, urinary  Frequency, nocturia, numbness, tingling, seizures,  Focal weakness, Loss of consciousness,  Tremor, insomnia, depression, anxiety, and suicidal ideation.      Objective:  BP 112/72 (BP Location: Left Arm, Patient Position: Sitting, Cuff Size: Normal)   Pulse 76   Temp 98.1 F (36.7 C) (Oral)   Resp 15   Ht 4' 9"  (1.448 m)   Wt 156 lb 6.4 oz (70.9 kg)   LMP 04/11/1981   SpO2 97%   BMI 33.84 kg/m   BP Readings from Last 3 Encounters:  10/18/18 112/72  08/17/18 110/76  06/23/18 98/60    Wt Readings from Last 3 Encounters:  10/18/18 156 lb 6.4 oz (70.9 kg)  08/17/18 162 lb 3.2 oz (73.6 kg)  06/23/18 160 lb 9.6 oz (72.8 kg)    General appearance: alert, cooperative and appears stated age Ears: normal TM's and external ear canals both ears Throat: lips, mucosa, and tongue normal; teeth and gums normal Neck: no adenopathy, no carotid bruit, supple, symmetrical, trachea midline and thyroid not enlarged, symmetric, no tenderness/mass/nodules Back: symmetric, no curvature. ROM normal. No CVA tenderness. Lungs: clear to auscultation bilaterally Heart: regular rate and rhythm, S1, S2 normal, no murmur, click, rub or gallop Skin:  Small  nonfluctuant resolving boil on right medial breast near nipple  And a similar one on mons pubis. No evidence of drainage ,  No erythema.  Pulses: 2+ and symmetric Skin: Skin color, texture, turgor normal. No rashes or lesions Lymph nodes: Cervical, supraclavicular, and axillary nodes normal.  Lab Results  Component Value Date   HGBA1C 5.6 08/17/2018   HGBA1C 5.5 12/28/2017   HGBA1C 5.7 06/27/2013    Lab Results  Component Value Date   CREATININE 0.80 08/17/2018   CREATININE 0.85 12/28/2017   CREATININE 0.84 10/07/2017    Lab Results  Component Value Date   WBC 6.9 12/28/2017   HGB 12.7 12/28/2017   HCT 38.5 12/28/2017   PLT 368.0 12/28/2017   GLUCOSE 99 08/17/2018  CHOL 174 05/19/2015   TRIG 97.0 05/19/2015   HDL 47.50 05/19/2015   LDLCALC 107 (H) 05/19/2015   ALT 13 08/17/2018   AST 25 08/17/2018   NA 141 08/17/2018   K 4.2 08/17/2018   CL 104 08/17/2018   CREATININE 0.80 08/17/2018   BUN 13 08/17/2018   CO2 30 08/17/2018   TSH 3.26 08/17/2018   INR 0.95 07/28/2017   HGBA1C 5.6 08/17/2018    Mm 3d Screen Breast Bilateral  Result Date: 10/11/2017 CLINICAL DATA:  Screening. EXAM: DIGITAL SCREENING BILATERAL MAMMOGRAM WITH TOMO AND CAD COMPARISON:  Previous exam(s). ACR Breast Density Category b: There are scattered areas of fibroglandular density. FINDINGS: There are no findings suspicious for malignancy. Images were processed with CAD. IMPRESSION: No mammographic evidence of malignancy. A result letter of this screening mammogram will be mailed directly to the patient. RECOMMENDATION: Screening mammogram in one year. (Code:SM-B-01Y) BI-RADS CATEGORY  1: Negative. Electronically Signed   By: Curlene Dolphin M.D.   On: 10/11/2017 12:37    Assessment & Plan:   Problem List Items Addressed This Visit    Boil of trunk    Boil boils are largely resolved.  There is no indication for incison and drainage or even antibiotics at this point.  Advised staff to [employer].   Advised saff to use only Dial soap for showering/bathing. Advised patient to notify staff if boils recur      Educated About Covid-19 Virus Infection    Educated patient on the signs and symptoms of COVID-19 infection and ways to avoid the viral infection including washing hands frequently with soap and water,  using hand sanitizer if unable to wash, avoiding touching face,  staying at home and limiting visitors,  and avoiding contact with people coming in and out of home.  Reminded patient to call office with questions/concerns.  The importance of social distancing was discussed today         I am having Cindy Oconnor maintain her ondansetron, acetaminophen, SYRINGE 3CC/25GX1", lactulose, magnesium hydroxide, traMADol, ketoconazole, Bioflexor, azelastine, predniSONE, cyanocobalamin, Symbicort, clotrimazole-betamethasone, senna, methotrexate, alendronate, D3-1000, levalbuterol, omeprazole, diclofenac sodium, levothyroxine, montelukast, Therems-M, calcium-vitamin D, docusate sodium, folic acid, and sulfamethoxazole-trimethoprim.  No orders of the defined types were placed in this encounter.   There are no discontinued medications.  Follow-up: No follow-ups on file.   Crecencio Mc, MD

## 2018-10-19 DIAGNOSIS — Z7189 Other specified counseling: Secondary | ICD-10-CM | POA: Insufficient documentation

## 2018-10-19 DIAGNOSIS — L02229 Furuncle of trunk, unspecified: Secondary | ICD-10-CM | POA: Insufficient documentation

## 2018-10-19 NOTE — Assessment & Plan Note (Signed)
Educated patient on the signs and symptoms of COVID-19 infection and ways to avoid the viral infection including washing hands frequently with soap and water,  using hand sanitizer if unable to wash, avoiding touching face,  staying at home and limiting visitors,  and avoiding contact with people coming in and out of home.  Reminded patient to call office with questions/concerns.  The importance of social distancing was discussed today

## 2018-10-19 NOTE — Assessment & Plan Note (Signed)
Boil boils are largely resolved.  There is no indication for incison and drainage or even antibiotics at this point.  Advised staff to [employer].  Advised saff to use only Dial soap for showering/bathing. Advised patient to notify staff if boils recur

## 2018-11-09 ENCOUNTER — Other Ambulatory Visit: Payer: Self-pay | Admitting: Internal Medicine

## 2018-11-09 DIAGNOSIS — Z1231 Encounter for screening mammogram for malignant neoplasm of breast: Secondary | ICD-10-CM

## 2018-11-20 ENCOUNTER — Other Ambulatory Visit: Payer: Self-pay

## 2018-11-20 ENCOUNTER — Ambulatory Visit
Admission: RE | Admit: 2018-11-20 | Discharge: 2018-11-20 | Disposition: A | Discharge status: Home / Self Care | Payer: Medicare Other | Source: Ambulatory Visit | Attending: Internal Medicine | Admitting: Internal Medicine

## 2018-11-20 ENCOUNTER — Other Ambulatory Visit: Payer: Self-pay | Admitting: Internal Medicine

## 2018-11-20 DIAGNOSIS — Z1231 Encounter for screening mammogram for malignant neoplasm of breast: Secondary | ICD-10-CM | POA: Insufficient documentation

## 2018-11-25 IMAGING — DX DG CHEST 2V
2 series · 2 of 2 positions shown · non-contrast
Comparison: Prior chest x-ray 09/14/2016

CLINICAL DATA: 46-year-old female with shortness of breath

EXAM:
CHEST  2 VIEW

[chest pa]
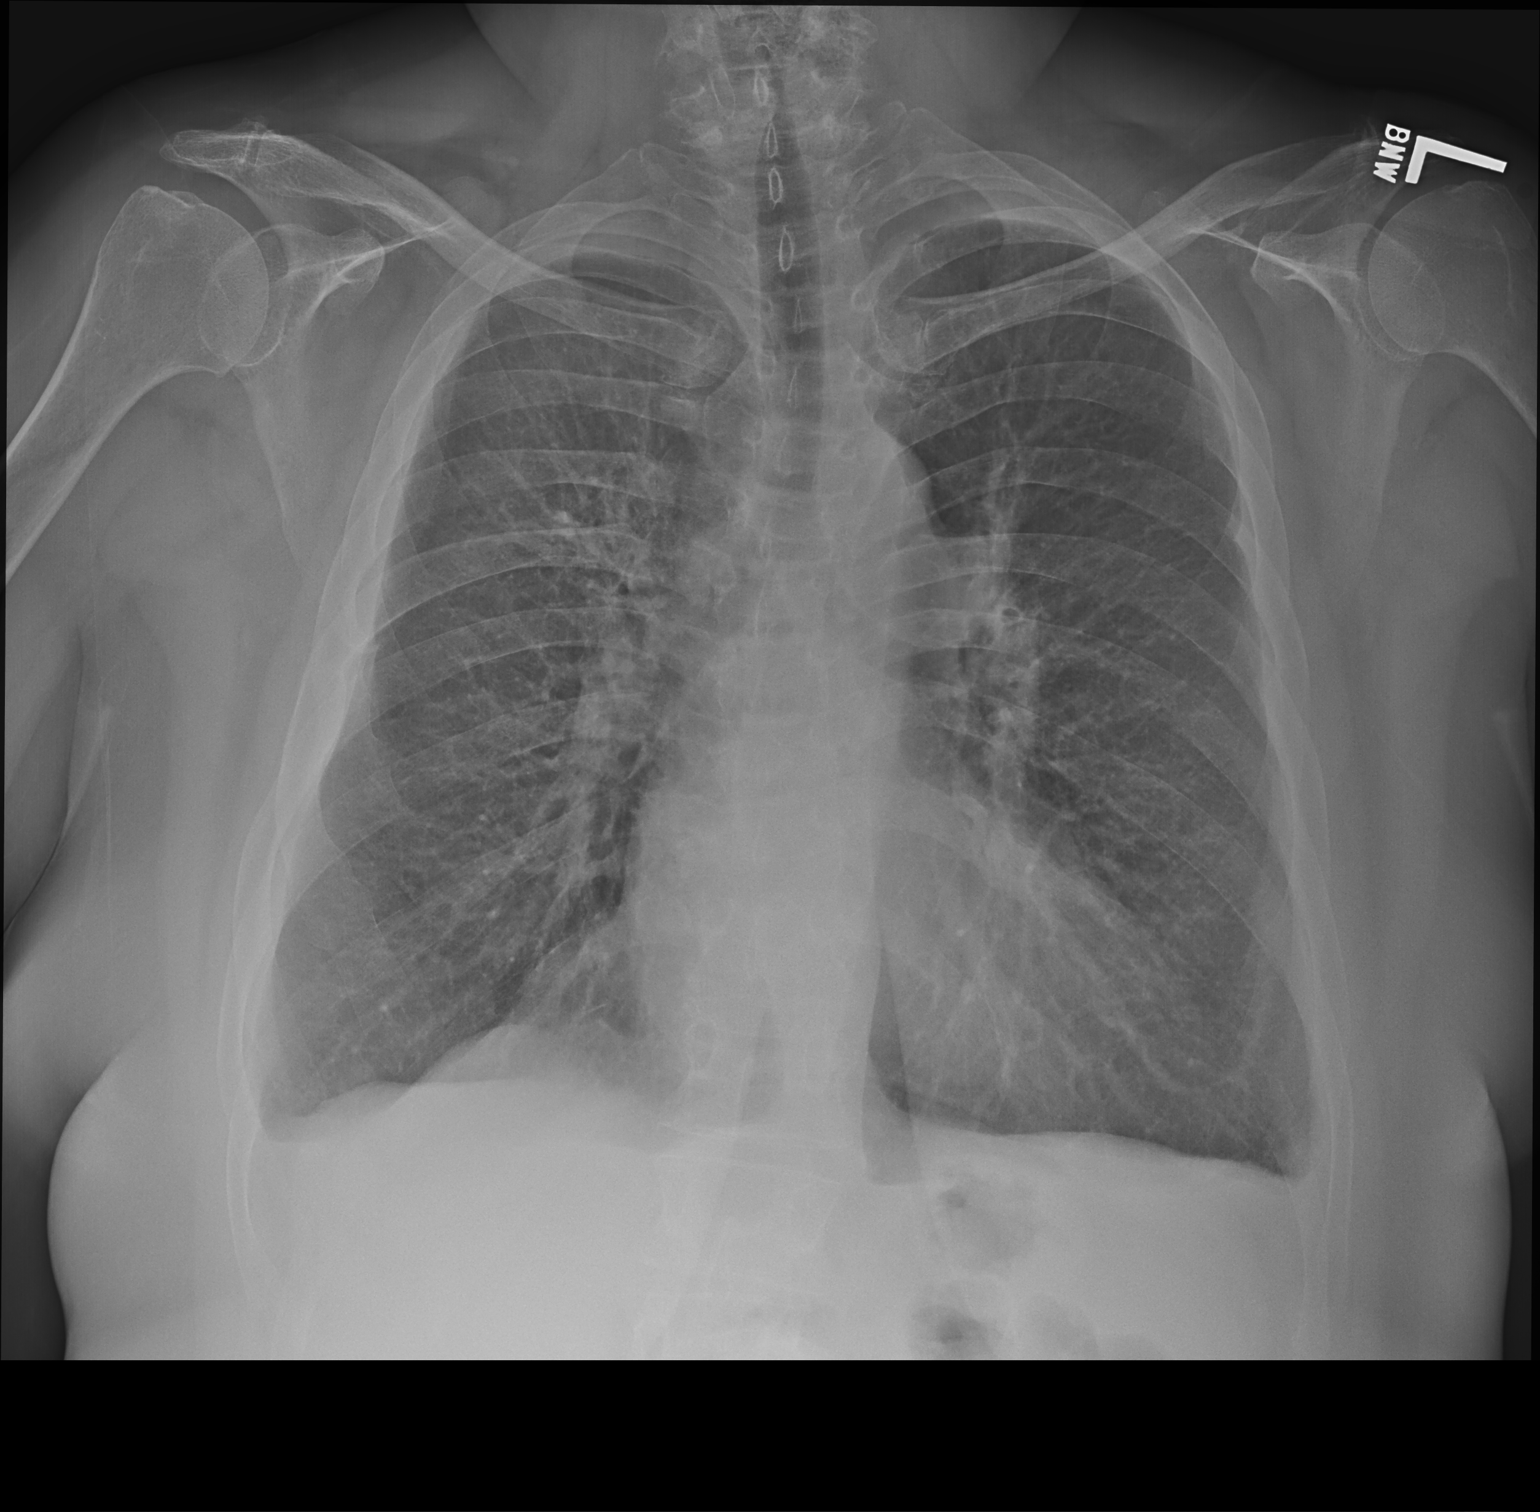

[chest lat]
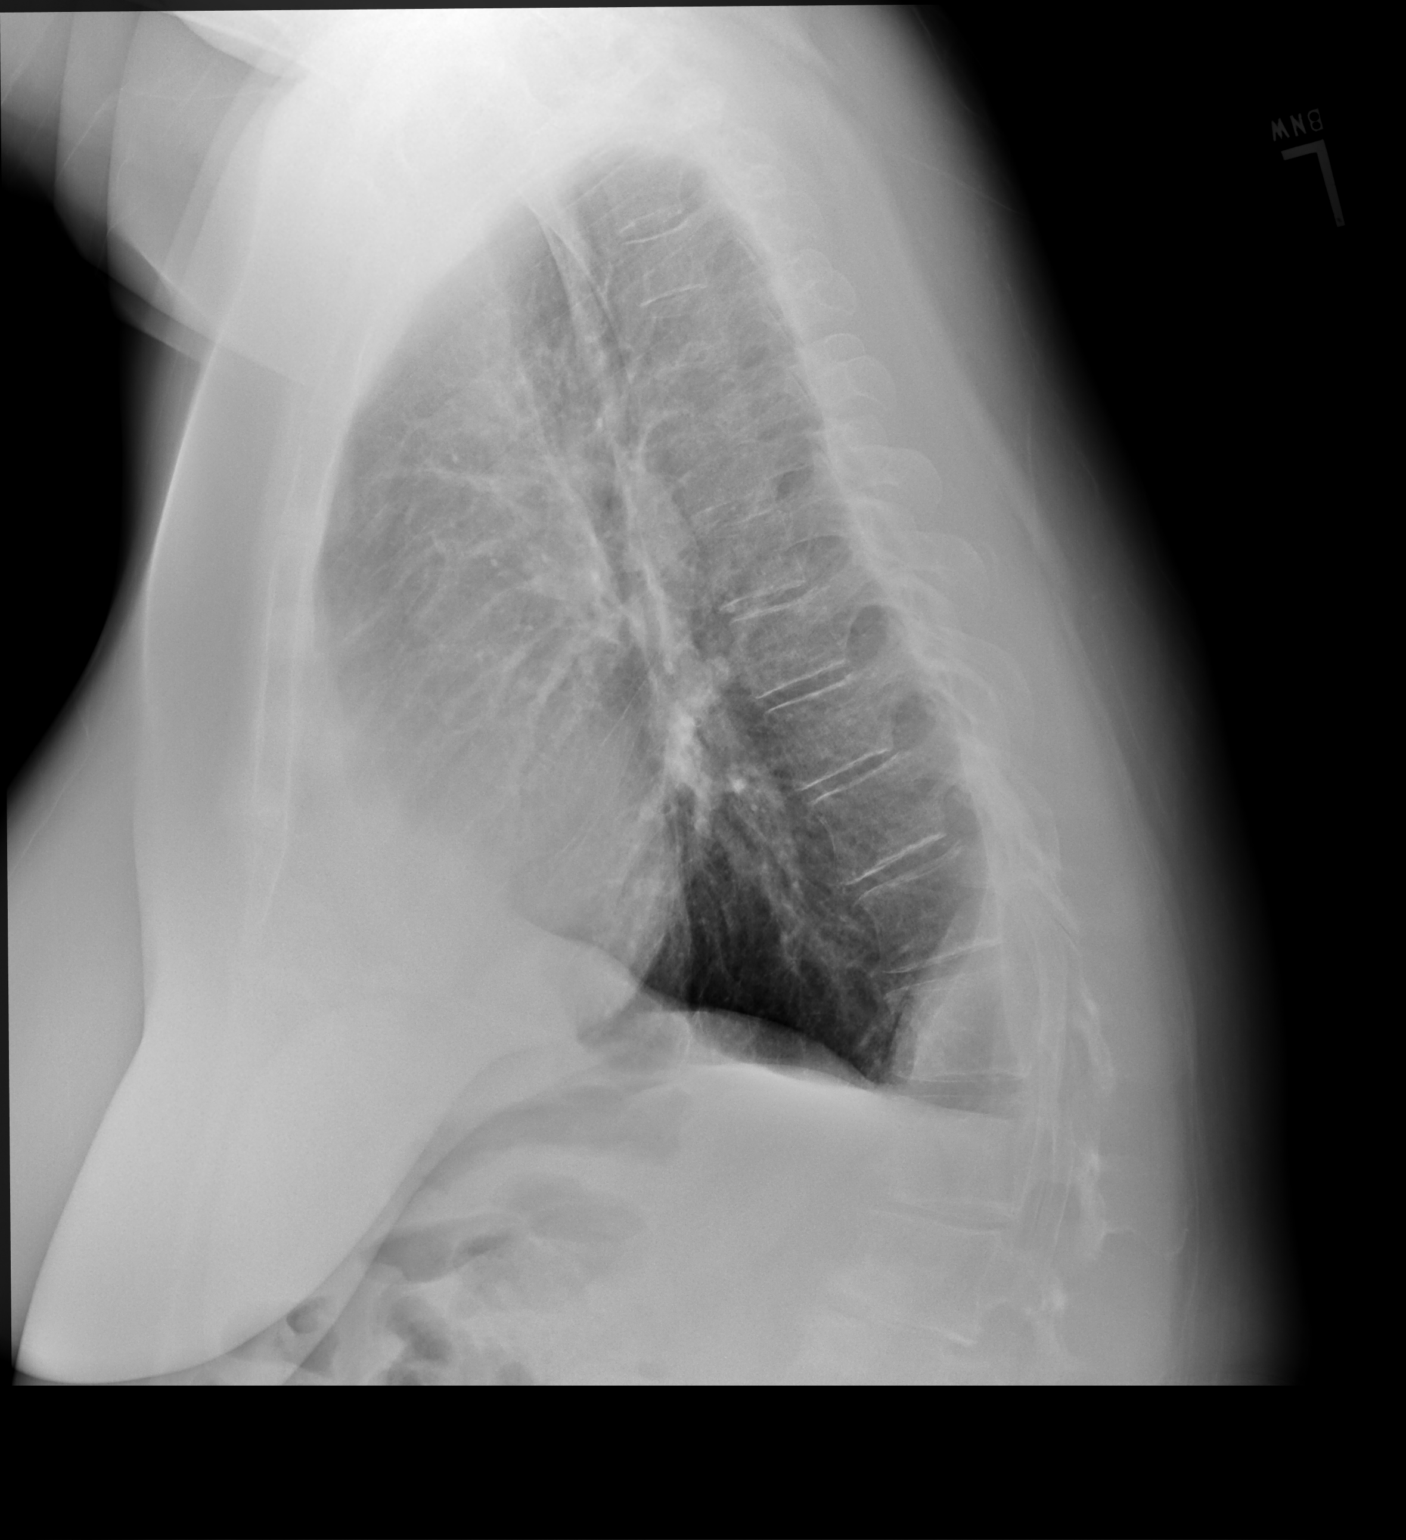

[2 of 2 positions shown; findings below may reference images not displayed]

FINDINGS: Stable cardiac and mediastinal contours. Mild bronchitic change and
interstitial prominence appears similar compared to prior. No new
airspace opacity, pulmonary edema or pneumothorax. Opacity
projecting over the posterior costophrenic sulcus on the lateral
view is similar compared to prior. Correlation with prior CT imaging
demonstrates fatty infiltration in the subpleural space. This is
unlikely to represent a pleural effusion. No acute osseous
abnormality.
IMPRESSION: No active cardiopulmonary disease.

## 2018-12-21 ENCOUNTER — Other Ambulatory Visit: Payer: Self-pay

## 2018-12-21 ENCOUNTER — Other Ambulatory Visit: Payer: Self-pay | Admitting: Internal Medicine

## 2018-12-22 NOTE — Telephone Encounter (Signed)
I called the group home of this patient and informed them that the patients prescriptions were sent to pharmacy.  Nina,cma

## 2019-01-15 ENCOUNTER — Other Ambulatory Visit: Payer: Self-pay | Admitting: Internal Medicine

## 2019-01-15 NOTE — Telephone Encounter (Signed)
Refilled: 10/07/2017 Last OV: 10/18/2018 Next OV: not scheduled

## 2019-01-17 DIAGNOSIS — M79675 Pain in left toe(s): Secondary | ICD-10-CM | POA: Diagnosis not present

## 2019-01-17 DIAGNOSIS — L97521 Non-pressure chronic ulcer of other part of left foot limited to breakdown of skin: Secondary | ICD-10-CM | POA: Diagnosis not present

## 2019-01-17 DIAGNOSIS — B351 Tinea unguium: Secondary | ICD-10-CM | POA: Diagnosis not present

## 2019-01-17 DIAGNOSIS — M79674 Pain in right toe(s): Secondary | ICD-10-CM | POA: Diagnosis not present

## 2019-01-23 ENCOUNTER — Other Ambulatory Visit: Payer: Self-pay | Admitting: Internal Medicine

## 2019-01-25 ENCOUNTER — Encounter: Payer: Self-pay | Admitting: Emergency Medicine

## 2019-01-25 ENCOUNTER — Other Ambulatory Visit: Payer: Self-pay

## 2019-01-25 ENCOUNTER — Telehealth: Payer: Self-pay

## 2019-01-25 ENCOUNTER — Ambulatory Visit: Payer: Self-pay

## 2019-01-25 ENCOUNTER — Ambulatory Visit
Admission: EM | Admit: 2019-01-25 | Discharge: 2019-01-25 | Disposition: A | Discharge status: Home / Self Care | Payer: Medicare Other | Attending: Urgent Care | Admitting: Urgent Care

## 2019-01-25 DIAGNOSIS — R220 Localized swelling, mass and lump, head: Secondary | ICD-10-CM

## 2019-01-25 MED ORDER — AMOXICILLIN-POT CLAVULANATE 875-125 MG PO TABS: 1.0000 | ORAL_TABLET | Freq: Two times a day (BID) | ORAL | 0 refills | Status: AC

## 2019-01-25 MED ORDER — AMOXICILLIN-POT CLAVULANATE 875-125 MG PO TABS: 1.0000 | ORAL_TABLET | Freq: Two times a day (BID) | ORAL | 0 refills | Status: DC

## 2019-01-25 NOTE — Telephone Encounter (Signed)
Outgoing call to Patient who the group home nurse reports that  Patient reports that woke up with the left side of patient face was swollen . Denies itching Pain is rated 10 Denies fever . Temp 98.6, 106/76 Hr 78. Attempted to transfer call to office left message for return call to Patient to set up appointment within 24 hours.  Related to Group home.           Reason for Disposition . Swelling is painful to touch  Answer Assessment - Initial Assessment Questions 1. ONSET: "When did the swelling start?" (e.g., minutes, hours, days)     Woke this morning 2. LOCATION: "What part of the face is swollen?"     Left side of face 3. SEVERITY: "How swollen is it?"     *No Answer* 4. ITCHING: "Is there any itching?" If so, ask: "How much?"   (Scale 1-10; mild, moderate or severe)     denies 5. PAIN: "Is the swelling painful to touch?" If so, ask: "How painful is it?"   (Scale 1-10; mild, moderate or severe)     10 6. FEVER: "Do you have a fever?" If so, ask: "What is it, how was it measured, and when did it start?"      98.6  7. CAUSE: "What do you think is causing the face swelling?"     *No Answer* 8. RECURRENT SYMPTOM: "Have you had face swelling before?" If so, ask: "When was the last time?" "What happened that time?"     denies 9. OTHER SYMPTOMS: "Do you have any other symptoms?" (e.g., toothache, leg swelling)     denies 10. PREGNANCY: "Is there any chance you are pregnant?" "When was your last menstrual period?"  Protocols used: [facility]

## 2019-01-25 NOTE — Telephone Encounter (Signed)
Already spoke to Sherlynn Carbon at [facility].  See previous phone note.  Advised to take patient to UC.

## 2019-01-25 NOTE — ED Triage Notes (Signed)
Pt c/o swelling in her left lower jaw and her left neck. She has some swelling in her right also but not as much. She also reports that it is painful. Denies fever.

## 2019-01-25 NOTE — Telephone Encounter (Signed)
Called group home.  Line is busy.  Unable to leave a message.

## 2019-01-25 NOTE — Discharge Instructions (Signed)
It was very nice seeing you today in clinic. Thank you for entrusting me with your care.   Apply warm compresses to help with the swelling. The area is painful and warm to touch. Will treat for facial cellulitis using Augmentin. May use Tylenol or Ibuprofen as needed for pain. Monitor for signs and symptoms of infection, which would include increased redness, swelling, streaking, drainage, pain, and the development of a fever.   Any shortness of breath or difficulty swallowing will need to be seen in the emergency department for further evaluation.   Make arrangements to follow up with your regular doctor in 2-3 days for re-evaluation if not improving. If your symptoms/condition worsens, please seek follow up care either here or in the ER. Please remember, our [place] providers are "right here with you" when you need [place].   Again, it was my pleasure to take care of you today. Thank you for choosing our clinic. I hope that you start to feel better quickly.   Honor Loh, MSN, APRN, FNP-C, CEN Advanced Practice Provider [facility]

## 2019-01-25 NOTE — Telephone Encounter (Signed)
Spoke with group home staff.  Staff stated that her face is more swollen than this morning.  Advised staff to take patient to Urgent Care.  Staff agreed.

## 2019-01-25 NOTE — Telephone Encounter (Signed)
Copied from [place] [id number]. Topic: General - Other >> Jan 25, 2019  3:04 PM Yvette Rack wrote: Reason for CRM: Cindy Oconnor with the pt Group Home called back to see if the pt could be seen but call was placed on hold. Geraldine requests call back. Cb# [phone]

## 2019-01-25 NOTE — ED Provider Notes (Signed)
[place], [place]   Name: Cindy Oconnor DOB: 10/23/1970 MRN: [id number] CSN: [id number] PCP: Crecencio Mc, MD  Arrival date and time:  01/25/19 1623  Chief Complaint:  Facial Swelling   NOTE: Prior to seeing the patient today, I have reviewed the triage nursing documentation and vital signs. Clinical staff has updated patient's PMH/PSHx, current medication list, and drug allergies/intolerances to ensure comprehensive history available to assist in medical decision making.   History:   HPI: Cindy Oconnor is a 48 y.o. female who presents today with complaints of pain and swelling in her LEFT jaw and neck that began with acute onset today. Patient is a resident at [facility]; accompanied today by a staff caregiver. Patient reported to have woken up this morning with swelling. Caregiver contacted PCP's office, however she was unable to be seen today. With reports of increased swelling, they were advised to present to urgent care of evaluation. Patient denies any associated symptoms; no fevers, shortness of breath, or difficulties swallowing. She has not been ill recently with any type of upper respiratory symptoms. She denies any dental pain. She has not been bitten by any insects that she can recall. Patient advises that, with the exception of her pain, she feels generally well today. Despite her symptoms, patient has not taken any over the counter interventions to help improve/relieve her reported symptoms at home.   Past Medical History:  Diagnosis Date  . Arthritis   . Asthma    unspecified  . Blood clot in vein   . Chicken pox   . Colitis   . COPD (chronic obstructive pulmonary disease) ([place])   . Deafness in left ear   . Disease of thyroid gland   . Down syndrome   . DVT (deep venous thrombosis) ([place])   . Fractures   . GERD (gastroesophageal reflux disease)   . Hypothyroidism    unspecified  . 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 Positive anti-CCP  antibodies, negative rheumatoid factor, positive FANA. b. Methotrexate, Prednisone, Plaquenil.   . Joint pain   . Lupus ([place]) 1995  . Osteoarthritis 01/30/2014   a. Lumbar disc disease. b. Trigger nodules  . Osteoporosis 01/30/2014   a. Boniva  . RA (rheumatoid arthritis) ([place])   . Shingles   . Ulcerative (chronic) enterocolitis ([place])   . Ulcerative colitis [facility])     Past Surgical History:  Procedure Laterality Date  . ABDOMINAL HYSTERECTOMY    . ABDOMINAL HYSTERECTOMY W/ PARTIAL [place]  . BUNIONECTOMY Bilateral    Bunionectomy great toe  . COLONOSCOPY  04/16/2013   Hx of ulcerative colitis - repeat 2 years per Dr. Rayann Heman  . COLONOSCOPY  09/24/2003   Dr. Brita Romp  . FOOT SURGERY Bilateral    x2  . HALLUX VALGUS CORRECTION    . KNEE ARTHROPLASTY Left 01/10/2017   Procedure: COMPUTER ASSISTED TOTAL KNEE ARTHROPLASTY;  Surgeon: Dereck Leep, MD;  Location: [facility] ORS;  Service: Orthopedics;  Laterality: Left;  . KNEE ARTHROPLASTY Right 08/10/2017   Procedure: COMPUTER ASSISTED TOTAL KNEE ARTHROPLASTY;  Surgeon: Dereck Leep, MD;  Location: [facility] ORS;  Service: Orthopedics;  Laterality: Right;    Family History  Problem Relation Age of Onset  . Heart disease Mother   . Diabetes Mother   . Breast cancer Mother 53  . Heart disease Father   . Diabetes Father   . Alcohol abuse Maternal Grandmother   . Arthritis Maternal Grandmother   . Stroke  Maternal Grandmother   . Diabetes Maternal Grandmother   . Alcohol abuse Maternal Grandfather   . Arthritis Maternal Grandfather   . Stroke Maternal Grandfather   . Diabetes Maternal Grandfather     Social History   Tobacco Use  . Smoking status: Never Smoker  . Smokeless tobacco: Never Used  Substance Use Topics  . Alcohol use: No  . Drug use: No    Patient Active Problem List   Diagnosis Date Noted  . Boil of trunk 10/19/2018  . Educated About Covid-19 Virus Infection 10/19/2018  . Allergic conjunctivitis,  bilateral 11/13/2017  . Long-term use of high-risk medication 10/09/2017  . S/P total knee arthroplasty 08/10/2017  . B12 deficiency 06/22/2017  . Primary osteoarthritis of right knee 06/02/2017  . Hypocalcemia 03/15/2017  . Status post total left knee replacement 01/10/2017  . Macrocytosis without anemia 01/08/2017  . Preoperative clearance 01/08/2017  . Moderate persistent asthma with acute exacerbation 09/14/2016  . Ulcerative colitis, chronic ([place]) 09/26/2015  . Chronic inflammatory arthritis 01/30/2014  . Osteoporosis 01/30/2014  . Mild tricuspid regurgitation 02/13/2013  . Routine general medical examination at a health care facility 01/24/2013  . Obesity (BMI 35.0-39.9 without comorbidity) 01/21/2012  . Seronegative rheumatoid arthritis of both hands ([place]) 06/11/2006  . S/P TAH (total abdominal hysterectomy)  and partial vaginectomy 06/11/2006  . Hypothyroid 03/18/2006  . Asthma, chronic 03/18/2006  . GERD 03/18/2006  . Down's syndrome 03/18/2006    Home Medications:    Current Meds  Medication Sig  . acetaminophen (TYLENOL) 325 MG tablet Take 2 tablets (650 mg total) by mouth 4 (four) times daily.  Marland Kitchen alendronate (FOSAMAX) 70 MG tablet TAKE 1 TABLET BY MOUTH WEEKLY EARLY MORNING BEFORE FOOD/MEDS WITH WATER DO NOT LIE DOWN FOR 30 MINUTES  . azelastine (OPTIVAR) 0.05 % ophthalmic solution PLACE 1 DROP INTO BOTH EYES 2 TIMES PER DAY.  . calcium-vitamin D (OSCAL WITH D) 500-200 MG-UNIT TABS tablet TAKEK 1 TABLET BY MOUTH EVERY DAY FOR SUPPLEMENT  . clotrimazole-betamethasone (LOTRISONE) cream APPLY TOPICALLY 2 TIMES PER DAY  . cyanocobalamin (,VITAMIN B-12,) 1000 MCG/ML injection GIVE 1000 MCG INJECTION "IM" ONCE A MONTH (B-12 SUPPLEMENT)  . D3-1000 25 MCG (1000 UT) tablet TAKEK 1 TABLET BY MOUTH EVERY DAY FOR SUPPLEMENT  . diclofenac sodium (VOLTAREN) 1 % GEL Apply 2 g topically 4 (four) times daily.  Marland Kitchen docusate sodium (COLACE) 100 MG capsule TAKE 1 CAPSULE BY MOUTH EVERY  OTHER DAY  . folic acid (FOLVITE) 1 MG tablet TAKE 1 TABLET BY MOUTH EVERY DAY  . ketoconazole (NIZORAL) 2 % cream APPLY TOPICALLY EVERY DAY  . lactulose (CHRONULAC) 10 GM/15ML solution Take 30 g by mouth 2 (two) times daily as needed.  . levalbuterol (XOPENEX) 0.63 MG/3ML nebulizer solution Take 3 mLs (0.63 mg total) by nebulization every 6 (six) hours as needed for wheezing or shortness of breath.  . levothyroxine (SYNTHROID, LEVOTHROID) 125 MCG tablet Take 1 tablet (125 mcg total) by mouth daily before breakfast.  . Liniments (BIOFLEXOR) 3 % GEL Apply to painful joints 2 to 3 times daily as needed  . magnesium hydroxide (MILK OF MAGNESIA) 400 MG/5ML suspension Take 30 mLs by mouth every 4 (four) hours as needed for mild constipation.  . methotrexate (RHEUMATREX) 2.5 MG tablet TAKE 7 TABLETS (17.5 MG) BY MOUTH ONCE AWEEK ON FRIDAY. **CAUTION: CHEMOTHERAPY.PROTECT FROM LIGHT**  . montelukast (SINGULAIR) 10 MG tablet TAKE 1 TABLET BY MOUTH AT BEDTIME  . Multiple Vitamins-Minerals (THEREMS-M) TABS TAKE 1 TABLET BY  MOUTH EVERY DAY FOR SUPPLEMENT  . omeprazole (PRILOSEC) 20 MG capsule TAKE 1 CAPSULE BY MOUTH 2 TIMES PER DAY **DO NOT CRUSH**  . ondansetron (ZOFRAN) 4 MG tablet Take 1 tablet (4 mg total) by mouth every 8 (eight) hours as needed for nausea or vomiting.  . predniSONE (DELTASONE) 2.5 MG tablet TAKE 1 TABLET BY MOUTH EACH DAY  . senna (SENOKOT) 8.6 MG TABS tablet TAKE 2 TABLETS(17.2 MG) BY MOUTH AT BEDTIME  . SYMBICORT 160-4.5 MCG/ACT inhaler INHALE 2 PUFFS ONCE DAILY (1-2 MINUTES BETWEEN PUFFS)  . Syringe/Needle, Disp, (SYRINGE 3CC/25GX1") 25G X 1" 3 ML MISC Use for b12 injections  . traMADol (ULTRAM) 50 MG tablet Take 50 mg by mouth every 6 (six) hours as needed for moderate pain.    Allergies:   Erythromycin, Oxycontin [oxycodone], Tramadol, and Albuterol  Review of Systems (ROS): Review of Systems  Constitutional: Negative for fatigue and fever.  HENT: Positive for facial  swelling. Negative for congestion, drooling, ear pain, postnasal drip, rhinorrhea, sinus pressure, sinus pain, sneezing, sore throat and trouble swallowing.   Eyes: Negative for pain, discharge and redness.  Respiratory: Negative for cough, chest tightness and shortness of breath.   Cardiovascular: Negative for chest pain and palpitations.  Gastrointestinal: Negative for abdominal pain, diarrhea, nausea and vomiting.  Musculoskeletal: Negative for arthralgias, back pain, myalgias and neck pain.  Skin: Negative for color change, pallor and rash.  Neurological: Negative for dizziness, syncope, weakness and headaches.  Hematological: Negative for adenopathy.     Vital Signs: Today's Vitals   01/25/19 1714 01/25/19 1719  BP:  107/78  Pulse:  65  Resp:  18  Temp:  98.3 F (36.8 C)  TempSrc:  Oral  SpO2:  98%  Weight: 156 lb 4.9 oz (70.9 kg)   Height: 5' 2"  (1.575 m)     Physical Exam: Physical Exam  Constitutional: She is oriented to person, place, and time and well-developed, well-nourished, and in no distress. No distress.  HENT:  Head: Normocephalic and atraumatic.  Nose: Nose normal.  Mouth/Throat: Oropharynx is clear and moist and mucous membranes are normal. No posterior oropharyngeal edema or posterior oropharyngeal erythema.  Eyes: Pupils are equal, round, and reactive to light. Conjunctivae and EOM are normal.  Neck: Trachea normal, normal range of motion and phonation normal. Neck supple. No tracheal tenderness present. No tracheal deviation present.    Swelling to LEFT lower jaw. Area of swelling is TTP and warm to touch. Increased erythema appreciated (minimal) when compared contralaterally. Able to swallow without difficulties. No evidence of airway compromise.   Cardiovascular: Normal rate, regular rhythm, normal heart sounds and intact distal pulses. Exam reveals no gallop and no friction rub.  No murmur heard. Pulmonary/Chest: Effort normal and breath sounds normal.  No stridor. No respiratory distress. She has no wheezes. She has no rales.  Abdominal: Soft. Bowel sounds are normal. She exhibits no distension. There is no abdominal tenderness.  Musculoskeletal: Normal range of motion.  Neurological: She is alert and oriented to person, place, and time. Gait normal.  Skin: Skin is warm and dry. No rash noted. She is not diaphoretic.  Psychiatric: Mood, memory, affect and judgment normal.  Nursing note and vitals reviewed.   Urgent Care Treatments / Results:   LABS: PLEASE NOTE: all labs that were ordered this encounter are listed, however only abnormal results are displayed. Labs Reviewed - No data to display  EKG: -None  RADIOLOGY: No results found.  PROCEDURES: Procedures  MEDICATIONS RECEIVED  THIS VISIT: Medications - No data to display  PERTINENT CLINICAL COURSE NOTES/UPDATES:   Initial Impression / Assessment and Plan / Urgent Care Course:  Pertinent labs & imaging results that were available during my care of the patient were personally reviewed by me and considered in my medical decision making (see lab/imaging section of note for values and interpretations).  Cindy Oconnor is a 48 y.o. female who presents to [facility] today with complaints of Facial Swelling   Patient is well appearing overall in clinic today. She does not appear to be in any acute distress. Presenting symptoms (see HPI) and exam as documented above. Swelling began with acute onset earlier today. Patient has no systemic signs of infection; no hypotension, tachycardia, fever, N/V. She is able to eat and drink without difficulties; no dysphagia. She denies any SOB; no wheezing or stridor on exam. Discussed DDx with patient and caregiver which includes dental infection, sialadenitis, sialolithiasis, reactive adenopathy, and skin infection. With the pain, erythema, and increased warmth noted on exam, there is concern for developing facial cellulitis. I  discussed with patient that there is likely an underlying bacterial infection that is causing the more superficial signs/symptoms that she is having. Reassurance provided. Will cover patient for bacterial etiology using a 10 day course of Augmentin. Encouraged warm compresses to help with pain and swelling. Discussed use of sialagogues to help ensure adequate salivary production/secretion. Patient may use APAP and/or IBU as needed for discomfort.   Discussed follow up with primary care physician in 2 days for re-evaluation. I have reviewed the follow up and strict return precautions for any new or worsening symptoms. Discussed that any increase in symptoms will necessitate further evaluation in the emergency department. Specifically advised that fever, progressive swelling, SOB, and dysphagia will need to be seen in the ED for immediate treatment, which will include labs, imaging, and intravenous antibiotics.  At the time of discharge, both the patient and her caregiver verbalized understanding and consent with the discharge plan as it was reviewed with her. All questions were fielded by provider and/or clinic staff prior to patient discharge.    Final Clinical Impressions / Urgent Care Diagnoses:   Final diagnoses:  Left facial swelling    New Prescriptions:  [place] Controlled Substance Registry consulted? Not Applicable  Meds ordered this encounter  Medications  . amoxicillin-clavulanate (AUGMENTIN) 875-125 MG tablet    Sig: Take 1 tablet by mouth 2 (two) times daily for 10 days.    Dispense:  20 tablet    Refill:  0    Recommended Follow up Care:  Patient encouraged to follow up with the following provider within the specified time frame, or sooner as dictated by the severity of her symptoms. As always, she was instructed that for any urgent/emergent care needs, she should seek care either here or in the emergency department for more immediate evaluation.  Follow-up Information    Crecencio Mc, MD In 2 days.   Specialty: Internal Medicine Contact information: [address] [place] [address] [phone]         NOTE: This note was prepared using Dragon dictation software along with smaller phrase technology. Despite my best ability to proofread, there is the potential that transcriptional errors may still occur from this process, and are completely unintentional.    Karen Kitchens, NP 01/25/19 2230

## 2019-01-26 ENCOUNTER — Ambulatory Visit (INDEPENDENT_AMBULATORY_CARE_PROVIDER_SITE_OTHER): Payer: Medicare Other | Admitting: Internal Medicine

## 2019-01-26 ENCOUNTER — Encounter: Payer: Self-pay | Admitting: Internal Medicine

## 2019-01-26 DIAGNOSIS — L0211 Cutaneous abscess of neck: Secondary | ICD-10-CM | POA: Diagnosis not present

## 2019-01-26 DIAGNOSIS — L03221 Cellulitis of neck: Secondary | ICD-10-CM

## 2019-01-26 MED ORDER — PROBIOTIC PO CAPS: 1.0000 | ORAL_CAPSULE | Freq: Every day | ORAL | 0 refills | Status: DC

## 2019-01-26 NOTE — Assessment & Plan Note (Addendum)
Somewhat better than yesterday despite not starting the antibiotics yet.  Agee with treating for cellulitis of unknown etiology.  Will add probiotic .  Staff advised to notify me if she reports tooth pain or gum pain and will add chlorhexidine rinse.

## 2019-01-26 NOTE — Progress Notes (Signed)
Virtual Visit via [email or web address]  This visit type was conducted due to national recommendations for restrictions regarding the COVID-19 pandemic (e.g. social distancing).  This format is felt to be most appropriate for this patient at this time.  All issues noted in this document were discussed and addressed.  No physical exam was performed (except for noted visual exam findings with Video Visits).   I connected with@ on 01/26/19 at  8:30 AM EDT by a video enabled telemedicine application  and verified that I am speaking with the correct person using two identifiers. Location patient:  [place] , OFFICE Location provider:  home office  Persons participating in the virtual visit: patient, provider and caregivers (2) I discussed the limitations, risks, security and privacy concerns of performing an evaluation and management service by telephone and the availability of in person appointments. I also discussed with the patient that there may be a patient responsible charge related to this service. The patient expressed understanding and agreed to proceed.  Reason for visit: follow up on urgent care visit   HPI:  48 yr female  With Down' syndrome, resident of group home presents for follow up on urgent Care visit Aug 27 for sudden onset left sided facial and jaw swelling.  History provided by patient and caregivers  Urgent Care records reviewed as well. Patient woke up on aug 27 with left side of face very swollen.  She reported mild pain along the jawline but no tooth pain and pain was not aggravated by eating.  No tongue swelling, pain odynophagia, fevers or body aches.  Patient has not left facility in several weeks and there have been no cases of COVID 19 reported in staff or other residents. .  Caregiver checked mouth for me during virtual visit and saw and no overt gum disease and  no abscess seen.  ER physician examined patient and prescribed augmentin for cellulitis.      ROS: See pertinent positives  and negatives per HPI.  Past Medical History:  Diagnosis Date  . Arthritis   . Asthma    unspecified  . Blood clot in vein   . Chicken pox   . Colitis   . COPD (chronic obstructive pulmonary disease) ([place])   . Deafness in left ear   . Disease of thyroid gland   . Down syndrome   . DVT (deep venous thrombosis) ([place])   . Fractures   . GERD (gastroesophageal reflux disease)   . Hypothyroidism    unspecified  . 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 Positive anti-CCP antibodies, negative rheumatoid factor, positive FANA. b. Methotrexate, Prednisone, Plaquenil.   . Joint pain   . Lupus ([place]) 1995  . Osteoarthritis 01/30/2014   a. Lumbar disc disease. b. Trigger nodules  . Osteoporosis 01/30/2014   a. Boniva  . RA (rheumatoid arthritis) ([place])   . Shingles   . 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 Ulcerative colitis [facility])     Past Surgical History:  Procedure Laterality Date  . ABDOMINAL HYSTERECTOMY    . ABDOMINAL HYSTERECTOMY W/ PARTIAL [place]  . BUNIONECTOMY Bilateral    Bunionectomy great toe  . COLONOSCOPY  04/16/2013   Hx of ulcerative colitis - repeat 2 years per Dr. Rayann Heman  . COLONOSCOPY  09/24/2003   Dr. Brita Romp  . FOOT SURGERY Bilateral    x2  . HALLUX VALGUS CORRECTION    . KNEE ARTHROPLASTY Left 01/10/2017   Procedure: COMPUTER ASSISTED TOTAL KNEE ARTHROPLASTY;  Surgeon:  Hooten, Laurice Record, MD;  Location: [facility] ORS;  Service: Orthopedics;  Laterality: Left;  . KNEE ARTHROPLASTY Right 08/10/2017   Procedure: COMPUTER ASSISTED TOTAL KNEE ARTHROPLASTY;  Surgeon: Dereck Leep, MD;  Location: [facility] ORS;  Service: Orthopedics;  Laterality: Right;    Family History  Problem Relation Age of Onset  . Heart disease Mother   . Diabetes Mother   . Breast cancer Mother 46  . Heart disease Father   . Diabetes Father   . Alcohol abuse Maternal Grandmother   . Arthritis Maternal Grandmother   . Stroke Maternal Grandmother   . Diabetes Maternal  Grandmother   . Alcohol abuse Maternal Grandfather   . Arthritis Maternal Grandfather   . Stroke Maternal Grandfather   . Diabetes Maternal Grandfather     SOCIAL HX: lives in group home.   No smoking , tobacco chewing, or alcohol.     EXAM:  VITALS per patient if applicable:  GENERAL: alert, oriented, appears well and in no acute distress  HEENT: atraumatic, conjunttiva clear, no obvious abnormalities on inspection of external nose and ears.  Diffuse swelling of left jaw starting at chin and submental area. Per caregiver  Small tender nodule on jawline c/w lymph node, NOT a boil.  NECK: normal movements of the head and neck  LUNGS: on inspection no signs of respiratory distress, breathing rate appears normal, no obvious gross SOB, gasping or wheezing  CV: no obvious cyanosis  MS: moves all visible extremities without noticeable abnormality  PSYCH/NEURO: pleasant and cooperative, no obvious depression or anxiety, speech and thought processing grossly intact  ASSESSMENT AND PLAN:  Discussed the following assessment and plan:  Cellulitis and abscess of neck  Cellulitis and abscess of neck Agree with treating for cellulitis of unknown etiology.  Will add probiotic .  Staff advised to notify me if she reports tooth pain or gum pain and will add chlorhexidine rinse.     I discussed the assessment and treatment plan with the patient. The patient was provided an opportunity to ask questions and all were answered. The patient agreed with the plan and demonstrated an understanding of the instructions.   The patient was advised to call back or seek an in-person evaluation if the symptoms worsen or if the condition fails to improve as anticipated.  I provided  15 minutes of non-face-to-face time during this encounter reviewing ER records and counselling patient and staff on need for surveillance for progression of symptoms despite initiation of  antibiotcs and probiotics    Crecencio Mc, MD

## 2019-01-30 ENCOUNTER — Other Ambulatory Visit: Payer: Self-pay | Admitting: Internal Medicine

## 2019-02-20 DIAGNOSIS — M810 Age-related osteoporosis without current pathological fracture: Secondary | ICD-10-CM | POA: Diagnosis not present

## 2019-02-20 DIAGNOSIS — M069 Rheumatoid arthritis, unspecified: Secondary | ICD-10-CM | POA: Diagnosis not present

## 2019-02-20 DIAGNOSIS — M6789 Other specified disorders of synovium and tendon, multiple sites: Secondary | ICD-10-CM | POA: Diagnosis not present

## 2019-02-20 DIAGNOSIS — M1990 Unspecified osteoarthritis, unspecified site: Secondary | ICD-10-CM | POA: Diagnosis not present

## 2019-02-20 DIAGNOSIS — Z79899 Other long term (current) drug therapy: Secondary | ICD-10-CM | POA: Diagnosis not present

## 2019-02-20 DIAGNOSIS — Q909 Down syndrome, unspecified: Secondary | ICD-10-CM | POA: Diagnosis not present

## 2019-02-20 DIAGNOSIS — E039 Hypothyroidism, unspecified: Secondary | ICD-10-CM | POA: Diagnosis not present

## 2019-03-01 ENCOUNTER — Telehealth: Payer: Self-pay | Admitting: Internal Medicine

## 2019-03-01 DIAGNOSIS — J209 Acute bronchitis, unspecified: Secondary | ICD-10-CM

## 2019-03-01 NOTE — Telephone Encounter (Signed)
Patient requesting amoxicillin-clavulanate (AUGMENTIN) 875-125 MG tablet sent to [facility]

## 2019-03-01 NOTE — Telephone Encounter (Signed)
Medication Refill - Medication:  amoxicillin-clavulanate (AUGMENTIN) 875-125 MG tablet  Has the patient contacted their pharmacy?  Yes advised to call office. Would like copy of order sent to patient's group home. Fax to [phone]. Patient is needing script as she is having dental work and typically gets medication before appointment.   Preferred Pharmacy (with phone number or street name):  [place], [place] [phone] (Phone) [phone] (Fax)   Agent: Please be advised that RX refills may take up to 3 business days. We ask that you follow-up with your pharmacy.

## 2019-03-02 MED ORDER — AMOXICILLIN-POT CLAVULANATE 875-125 MG PO TABS: ORAL_TABLET | ORAL | 0 refills | Status: DC

## 2019-03-02 NOTE — Telephone Encounter (Signed)
SENT AUGMENTIN  TO [facility]

## 2019-03-02 NOTE — Telephone Encounter (Signed)
Pt is needing augmentin rx for a dental appt she has coming up. Care giver stated that she usually gets this before a dental procedure.

## 2019-03-05 NOTE — Telephone Encounter (Signed)
Shay Long, from [facility], called and is requesting to have an order for this prescription sent to them as well.   Fax # [phone]

## 2019-03-05 NOTE — Telephone Encounter (Signed)
Faxed

## 2019-03-05 NOTE — Telephone Encounter (Signed)
I printed a letter for the order. Placed in quick sign folder for signature.

## 2019-03-05 NOTE — Telephone Encounter (Signed)
SIGNED AND RETURNED TO YOU

## 2019-03-07 DIAGNOSIS — Z780 Asymptomatic menopausal state: Secondary | ICD-10-CM | POA: Diagnosis not present

## 2019-03-07 DIAGNOSIS — M810 Age-related osteoporosis without current pathological fracture: Secondary | ICD-10-CM | POA: Diagnosis not present

## 2019-03-07 DIAGNOSIS — M8588 Other specified disorders of bone density and structure, other site: Secondary | ICD-10-CM | POA: Diagnosis not present

## 2019-03-08 DIAGNOSIS — Z23 Encounter for immunization: Secondary | ICD-10-CM | POA: Diagnosis not present

## 2019-03-26 ENCOUNTER — Telehealth: Payer: Self-pay | Admitting: *Deleted

## 2019-03-26 MED ORDER — BUDESONIDE-FORMOTEROL FUMARATE 160-4.5 MCG/ACT IN AERO: INHALATION_SPRAY | RESPIRATORY_TRACT | 11 refills | Status: DC

## 2019-03-26 NOTE — Telephone Encounter (Signed)
Copied from [place] [phone]. Topic: General - Other >> Mar 26, 2019  3:24 PM Rainey Pines A wrote: Jeani Hawking from [facility] stated that they still have not received fax in regards to pateints Symbicourt today. Best contact [phone]

## 2019-03-26 NOTE — Telephone Encounter (Signed)
rx for Symbicort  has been faxed over to [facility].

## 2019-03-26 NOTE — Telephone Encounter (Signed)
Cindy Oconnor with [facility], called in stating they would like script faxed over to [phone], as patient recently just switched over to their pharmacy. Please advise.

## 2019-03-27 NOTE — Telephone Encounter (Signed)
Medication was faxed to [facility] yesterday and I spoke with the pharmacy as well.

## 2019-04-03 ENCOUNTER — Other Ambulatory Visit: Payer: Self-pay

## 2019-04-03 ENCOUNTER — Telehealth: Payer: Self-pay | Admitting: Internal Medicine

## 2019-04-03 MED ORDER — LEVALBUTEROL HCL 0.63 MG/3ML IN NEBU: 0.6300 mg | INHALATION_SOLUTION | Freq: Four times a day (QID) | RESPIRATORY_TRACT | 1 refills | Status: DC | PRN

## 2019-04-03 MED ORDER — DOCUSATE SODIUM 100 MG PO CAPS: ORAL_CAPSULE | ORAL | 2 refills | Status: DC

## 2019-04-03 MED ORDER — ACETAMINOPHEN 325 MG PO TABS: 650.0000 mg | ORAL_TABLET | Freq: Four times a day (QID) | ORAL | 5 refills | Status: DC

## 2019-04-03 MED ORDER — ACETAMINOPHEN 325 MG PO TABS: 650.0000 mg | ORAL_TABLET | Freq: Three times a day (TID) | ORAL | 5 refills | Status: DC | PRN

## 2019-04-03 NOTE — Telephone Encounter (Signed)
Should the tylenol rx be as needed up to four times daily?

## 2019-04-03 NOTE — Telephone Encounter (Signed)
Pharm needs clarification on tylenol 325 mg

## 2019-04-03 NOTE — Telephone Encounter (Signed)
Tylenol sig should be Should be 650 mg every 8 hours prn,  corrected and set to mail order

## 2019-04-23 ENCOUNTER — Emergency Department
Admission: EM | Admit: 2019-04-23 | Discharge: 2019-04-23 | Disposition: A | Discharge status: Home / Self Care | Payer: Medicare Other | Attending: Student in an Organized Health Care Education/Training Program | Admitting: Student in an Organized Health Care Education/Training Program

## 2019-04-23 ENCOUNTER — Other Ambulatory Visit: Payer: Self-pay

## 2019-04-23 ENCOUNTER — Telehealth: Payer: Self-pay | Admitting: *Deleted

## 2019-04-23 ENCOUNTER — Encounter: Payer: Self-pay | Admitting: Emergency Medicine

## 2019-04-23 ENCOUNTER — Emergency Department: Payer: Medicare Other

## 2019-04-23 DIAGNOSIS — Z79899 Other long term (current) drug therapy: Secondary | ICD-10-CM | POA: Insufficient documentation

## 2019-04-23 DIAGNOSIS — Z96651 Presence of right artificial knee joint: Secondary | ICD-10-CM | POA: Insufficient documentation

## 2019-04-23 DIAGNOSIS — J449 Chronic obstructive pulmonary disease, unspecified: Secondary | ICD-10-CM | POA: Diagnosis not present

## 2019-04-23 DIAGNOSIS — R05 Cough: Secondary | ICD-10-CM | POA: Diagnosis present

## 2019-04-23 DIAGNOSIS — E039 Hypothyroidism, unspecified: Secondary | ICD-10-CM | POA: Insufficient documentation

## 2019-04-23 DIAGNOSIS — Z96652 Presence of left artificial knee joint: Secondary | ICD-10-CM | POA: Diagnosis not present

## 2019-04-23 DIAGNOSIS — R300 Dysuria: Secondary | ICD-10-CM | POA: Diagnosis not present

## 2019-04-23 DIAGNOSIS — J069 Acute upper respiratory infection, unspecified: Secondary | ICD-10-CM | POA: Diagnosis not present

## 2019-04-23 DIAGNOSIS — U071 COVID-19: Secondary | ICD-10-CM | POA: Diagnosis not present

## 2019-04-23 DIAGNOSIS — R197 Diarrhea, unspecified: Secondary | ICD-10-CM | POA: Insufficient documentation

## 2019-04-23 DIAGNOSIS — R9431 Abnormal electrocardiogram [ECG] [EKG]: Secondary | ICD-10-CM | POA: Diagnosis not present

## 2019-04-23 DIAGNOSIS — R1110 Vomiting, unspecified: Secondary | ICD-10-CM | POA: Diagnosis not present

## 2019-04-23 LAB — BASIC METABOLIC PANEL
Anion gap: 12 (ref 5–15)
BUN: 13 mg/dL (ref 6–20)
CO2: 28 mmol/L (ref 22–32)
Calcium: 8.9 mg/dL (ref 8.9–10.3)
Chloride: 102 mmol/L (ref 98–111)
Creatinine, Ser: 0.84 mg/dL (ref 0.44–1.00)
GFR calc Af Amer: >60 mL/min (ref >60)
GFR calc non Af Amer: >60 mL/min (ref >60)
Glucose, Bld: 118 mg/dL — ABNORMAL HIGH (ref 70–99)
Potassium: 4.1 mmol/L (ref 3.5–5.1)
Sodium: 142 mmol/L (ref 135–145)

## 2019-04-23 LAB — TROPONIN I (HIGH SENSITIVITY): Troponin I (High Sensitivity): 8 ng/L (ref <18)

## 2019-04-23 LAB — HEPATIC FUNCTION PANEL
ALT: 18 U/L (ref 0–44)
AST: 36 U/L (ref 15–41)
Albumin: 3.1 g/dL — ABNORMAL LOW (ref 3.5–5.0)
Alkaline Phosphatase: 67 U/L (ref 38–126)
Bilirubin, Direct: <0.1 mg/dL (ref 0.0–0.2)
Total Bilirubin: 0.4 mg/dL (ref 0.3–1.2)
Total Protein: 7.3 g/dL (ref 6.5–8.1)

## 2019-04-23 LAB — CBC
HCT: 37.6 % (ref 36.0–46.0)
Hemoglobin: 13 g/dL (ref 12.0–15.0)
MCH: 34.9 pg — ABNORMAL HIGH (ref 26.0–34.0)
MCHC: 34.6 g/dL (ref 30.0–36.0)
MCV: 100.8 fL — ABNORMAL HIGH (ref 80.0–100.0)
Platelets: 357 10*3/uL (ref 150–400)
RBC: 3.73 MIL/uL — ABNORMAL LOW (ref 3.87–5.11)
RDW: 14.1 % (ref 11.5–15.5)
WBC: 4.5 10*3/uL (ref 4.0–10.5)
nRBC: 0 % (ref 0.0–0.2)

## 2019-04-23 LAB — LIPASE, BLOOD: Lipase: 28 U/L (ref 11–51)

## 2019-04-23 LAB — POC SARS CORONAVIRUS 2 AG: SARS Coronavirus 2 Ag: POSITIVE — AB

## 2019-04-23 MED ORDER — ONDANSETRON 4 MG PO TBDP
4.0000 mg | ORAL_TABLET | Freq: Once | ORAL | Status: AC
  Administered 2019-04-23: 4 mg via ORAL
  Filled 2019-04-23: qty 1

## 2019-04-23 MED ORDER — DEXAMETHASONE 4 MG PO TABS
6.0000 mg | ORAL_TABLET | Freq: Once | ORAL | Status: AC
  Administered 2019-04-23: 6 mg via ORAL
  Filled 2019-04-23: qty 1.5

## 2019-04-23 MED ORDER — SODIUM CHLORIDE 0.9% FLUSH: 3.0000 mL | Freq: Once | INTRAVENOUS | Status: DC

## 2019-04-23 MED ORDER — ALBUTEROL SULFATE HFA 108 (90 BASE) MCG/ACT IN AERS: 2.0000 | INHALATION_SPRAY | Freq: Four times a day (QID) | RESPIRATORY_TRACT | 0 refills | Status: DC | PRN

## 2019-04-23 MED ORDER — ONDANSETRON HCL 4 MG PO TABS: 4.0000 mg | ORAL_TABLET | Freq: Every day | ORAL | 0 refills | Status: DC | PRN

## 2019-04-23 MED ORDER — BENZONATATE 100 MG PO CAPS: 100.0000 mg | ORAL_CAPSULE | Freq: Four times a day (QID) | ORAL | 0 refills | Status: DC | PRN

## 2019-04-23 NOTE — ED Triage Notes (Addendum)
C/O productive cough, nausea, abdominal pain, emesis x 1 day.  C/O epigastric pain.  Patient lives in a group home.

## 2019-04-23 NOTE — Telephone Encounter (Signed)
Copied from [place] [phone]. Topic: Appointment Scheduling - Scheduling Inquiry for Clinic >> Apr 23, 2019 12:35 PM Alanda Slim E wrote: Reason for CRM: Pt has been experiencing a bad cough through out the night ans the day as well. Pt needs an appt on a weds or Thursday asap/ please advise

## 2019-04-23 NOTE — Telephone Encounter (Signed)
Cough started X 1 week ago , coughed all last night, no feer , denies chills and body aches , BP 91/63  Pulse 76, 02 sats at 90 % on room air. Patient denies SOB but hurts in her chest, Patient has had nausea and vomiting. Patient feels SOB with walking and says I ca hear her breathing, 89% -90%. Advised no appointments in office patient needs testing needs to be see at [facility] or ER , facility [facility] stated she will take her to Urgent care.

## 2019-04-23 NOTE — ED Provider Notes (Signed)
[facility] Emergency Department Provider Note    First MD Initiated Contact with Patient 04/23/19 1950     (approximate)  I have reviewed the triage vital signs and the nursing notes.   HISTORY  Chief Complaint Emesis and Nausea    HPI Cindy Oconnor is a 48 y.o. female close past medical history presents to ER for persistent cough that kept her awake throughout the night last night associated with some posttussive emesis.  States she is also had some watery diarrhea.  Not having measured fevers but has felt chills.  No recent antibiotics.  Is also complaining of some dysuria.  Does live in a group home setting.    Past Medical History:  Diagnosis Date  . Arthritis   . Asthma    unspecified  . Blood clot in vein   . Chicken pox   . Colitis   . COPD (chronic obstructive pulmonary disease) ([place])   . Deafness in left ear   . Disease of thyroid gland   . Down syndrome   . DVT (deep venous thrombosis) ([place])   . Fractures   . GERD (gastroesophageal reflux disease)   . Hypothyroidism    unspecified  . 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 Positive anti-CCP antibodies, negative rheumatoid factor, positive FANA. b. Methotrexate, Prednisone, Plaquenil.   . Joint pain   . Lupus ([place]) 1995  . Osteoarthritis 01/30/2014   a. Lumbar disc disease. b. Trigger nodules  . Osteoporosis 01/30/2014   a. [place]  . RA (rheumatoid arthritis) ([place])   . Shingles   . Ulcerative (chronic) enterocolitis ([place])   . Ulcerative colitis ([place])    Family History  Problem Relation Age of Onset  . Heart disease Mother   . Diabetes Mother   . Breast cancer Mother 26  . Heart disease Father   . Diabetes Father   . Alcohol abuse Maternal Grandmother   . Arthritis Maternal Grandmother   . Stroke Maternal Grandmother   . Diabetes Maternal Grandmother   . Alcohol abuse Maternal Grandfather   . Arthritis Maternal Grandfather   . Stroke Maternal Grandfather   .  Diabetes Maternal Grandfather    Past Surgical History:  Procedure Laterality Date  . ABDOMINAL HYSTERECTOMY    . ABDOMINAL HYSTERECTOMY W/ PARTIAL [place]  . BUNIONECTOMY Bilateral    Bunionectomy great toe  . COLONOSCOPY  04/16/2013   Hx of ulcerative colitis - repeat 2 years per Dr. Rayann Heman  . COLONOSCOPY  09/24/2003   Dr. Brita Romp  . FOOT SURGERY Bilateral    x2  . HALLUX VALGUS CORRECTION    . KNEE ARTHROPLASTY Left 01/10/2017   Procedure: COMPUTER ASSISTED TOTAL KNEE ARTHROPLASTY;  Surgeon: Dereck Leep, MD;  Location: [facility] ORS;  Service: Orthopedics;  Laterality: Left;  . KNEE ARTHROPLASTY Right 08/10/2017   Procedure: COMPUTER ASSISTED TOTAL KNEE ARTHROPLASTY;  Surgeon: Dereck Leep, MD;  Location: [facility] ORS;  Service: Orthopedics;  Laterality: Right;   Patient Active Problem List   Diagnosis Date Noted  . Cellulitis and abscess of neck 01/26/2019  . Boil of trunk 10/19/2018  . Educated about COVID-19 virus infection 10/19/2018  . Allergic conjunctivitis, bilateral 11/13/2017  . Long-term use of high-risk medication 10/09/2017  . S/P total knee arthroplasty 08/10/2017  . B12 deficiency 06/22/2017  . Primary osteoarthritis of right knee 06/02/2017  . Hypocalcemia 03/15/2017  . Status post total left knee replacement 01/10/2017  . Macrocytosis without anemia 01/08/2017  .  Preoperative clearance 01/08/2017  . Moderate persistent asthma with acute exacerbation 09/14/2016  . Ulcerative colitis, chronic ([place]) 09/26/2015  . Chronic inflammatory arthritis 01/30/2014  . Osteoporosis 01/30/2014  . Mild tricuspid regurgitation 02/13/2013  . Routine general medical examination at a health care facility 01/24/2013  . Obesity (BMI 35.0-39.9 without comorbidity) 01/21/2012  . Seronegative rheumatoid arthritis of both hands ([place]) 06/11/2006  . S/P TAH (total abdominal hysterectomy)  and partial vaginectomy 06/11/2006  . Hypothyroid 03/18/2006  . Asthma, chronic  03/18/2006  . GERD 03/18/2006  . Down's syndrome 03/18/2006      Prior to Admission medications   Medication Sig Start Date End Date Taking? Authorizing Provider  acetaminophen (TYLENOL) 325 MG tablet Take 2 tablets (650 mg total) by mouth 3 (three) times daily as needed. 04/03/19   Crecencio Mc, MD  albuterol (VENTOLIN HFA) 108 (90 Base) MCG/ACT inhaler Inhale 2 puffs into the lungs every 6 (six) hours as needed for wheezing or shortness of breath. 04/23/19   Merlyn Lot, MD  alendronate (FOSAMAX) 70 MG tablet TAKE 1 TABLET BY MOUTH WEEKLY EARLY MORNING BEFORE FOOD/MEDS WITH WATER DO NOT LIE DOWN FOR 30 MINUTES 12/21/18   Crecencio Mc, MD  amoxicillin-clavulanate (AUGMENTIN) 875-125 MG tablet 1 TABLET 1 HOUR PRIOR TO DENTAL PROCEDURE AND ONE TABLET 12 HOURS LATER 03/02/19   Crecencio Mc, MD  azelastine (OPTIVAR) 0.05 % ophthalmic solution PLACE 1 DROP INTO BOTH EYES 2 TIMES PER DAY. 11/20/18   Crecencio Mc, MD  benzonatate (TESSALON PERLES) 100 MG capsule Take 1 capsule (100 mg total) by mouth every 6 (six) hours as needed for cough. 04/23/19 04/22/20  Merlyn Lot, MD  benzonatate (TESSALON) 200 MG capsule TAKE 1 CAPSULE BY MOUTH 3 TIMES PER DAY FOR 10 DAYS 01/30/19   Crecencio Mc, MD  budesonide-formoterol (SYMBICORT) 160-4.5 MCG/ACT inhaler INHALE 2 PUFFS ONCE DAILY (1-2 MINUTES BETWEEN PUFFS) 03/26/19   Crecencio Mc, MD  calcium-vitamin D (OSCAL WITH D) 500-200 MG-UNIT TABS tablet TAKEK 1 TABLET BY MOUTH EVERY DAY FOR SUPPLEMENT 12/21/18   Crecencio Mc, MD  clotrimazole-betamethasone (LOTRISONE) cream APPLY TOPICALLY 2 TIMES PER DAY 01/19/18   Crecencio Mc, MD  cyanocobalamin (,VITAMIN B-12,) 1000 MCG/ML injection GIVE 1000 MCG INJECTION "IM" ONCE A MONTH (B-12 SUPPLEMENT) 12/22/18   Crecencio Mc, MD  D3-1000 25 MCG (1000 UT) tablet TAKEK 1 TABLET BY MOUTH EVERY DAY FOR SUPPLEMENT 12/21/18   Crecencio Mc, MD  diclofenac sodium (VOLTAREN) 1 % GEL Apply 2 g  topically 4 (four) times daily.    [provider]  docusate sodium (COLACE) 100 MG capsule TAKE 1 CAPSULE BY MOUTH EVERY OTHER DAY 04/03/19   Crecencio Mc, MD  folic acid (FOLVITE) 1 MG tablet TAKE 1 TABLET BY MOUTH EVERY DAY 12/21/18   Crecencio Mc, MD  ketoconazole (NIZORAL) 2 % cream APPLY TOPICALLY EVERY DAY 01/15/19   Crecencio Mc, MD  lactulose (CHRONULAC) 10 GM/15ML solution Take 30 g by mouth 2 (two) times daily as needed.    [provider]  levalbuterol Penne Lash) 0.63 MG/3ML nebulizer solution Take 3 mLs (0.63 mg total) by nebulization every 6 (six) hours as needed for wheezing or shortness of breath. 04/03/19   Crecencio Mc, MD  levothyroxine (SYNTHROID, LEVOTHROID) 125 MCG tablet Take 1 tablet (125 mcg total) by mouth daily before breakfast. 08/18/18   Crecencio Mc, MD  Liniments (BIOFLEXOR) 3 % GEL Apply to painful joints 2  to 3 times daily as needed 10/13/17   Crecencio Mc, MD  magnesium hydroxide (MILK OF MAGNESIA) 400 MG/5ML suspension Take 30 mLs by mouth every 4 (four) hours as needed for mild constipation.    [provider]  methotrexate (RHEUMATREX) 2.5 MG tablet TAKE 7 TABLETS (17.5 MG) BY MOUTH ONCE AWEEK ON FRIDAY. **CAUTION: CHEMOTHERAPY.PROTECT FROM LIGHT** 03/29/18   Crecencio Mc, MD  montelukast (SINGULAIR) 10 MG tablet TAKE 1 TABLET BY MOUTH AT BEDTIME 12/21/18   Crecencio Mc, MD  Multiple Vitamins-Minerals (THEREMS-M) TABS TAKE 1 TABLET BY MOUTH EVERY DAY FOR SUPPLEMENT 01/23/19   Crecencio Mc, MD  omeprazole (PRILOSEC) 20 MG capsule TAKE 1 CAPSULE BY MOUTH 2 TIMES PER DAY **DO NOT CRUSH** 12/21/18   Crecencio Mc, MD  ondansetron (ZOFRAN) 4 MG tablet Take 1 tablet (4 mg total) by mouth every 8 (eight) hours as needed for nausea or vomiting. 03/24/17   Crecencio Mc, MD  ondansetron (ZOFRAN) 4 MG tablet Take 1 tablet (4 mg total) by mouth daily as needed. 04/23/19 04/22/20  Merlyn Lot, MD  predniSONE (DELTASONE) 2.5  MG tablet TAKE 1 TABLET BY MOUTH EACH DAY 12/21/18   Crecencio Mc, MD  Probiotic CAPS Take 1 capsule by mouth daily. To prevent c dificile colitis 01/26/19   Crecencio Mc, MD  senna (SENOKOT) 8.6 MG TABS tablet TAKE 2 TABLETS(17.2 MG) BY MOUTH AT BEDTIME 01/23/19   Crecencio Mc, MD  sulfamethoxazole-trimethoprim (BACTRIM DS) 800-160 MG tablet Take 1 tablet by mouth 2 (two) times daily. 10/17/18   Crecencio Mc, MD  Syringe/Needle, Disp, (SYRINGE 3CC/25GX1") 25G X 1" 3 ML MISC Use for b12 injections 06/22/17   Crecencio Mc, MD  traMADol (ULTRAM) 50 MG tablet Take 50 mg by mouth every 6 (six) hours as needed for moderate pain.    [provider]    Allergies Erythromycin, Oxycontin [oxycodone], Tramadol, and Albuterol    Social History Social History   Tobacco Use  . Smoking status: Never Smoker  . Smokeless tobacco: Never Used  Substance Use Topics  . Alcohol use: No  . Drug use: No    Review of Systems Patient denies headaches, rhinorrhea, blurry vision, numbness, shortness of breath, chest pain, edema, cough, abdominal pain, nausea, vomiting, diarrhea, dysuria, fevers, rashes or hallucinations unless otherwise stated above in HPI. ____________________________________________   PHYSICAL EXAM:  VITAL SIGNS: Vitals:   04/23/19 1805 04/23/19 2137  BP: 126/70 120/74  Pulse: 74 88  Resp: 16 18  Temp: 97.8 F (36.6 C)   SpO2: 95% 99%    Constitutional: Alert , pleasant, nontoxic appearing Eyes: Conjunctivae are normal.  Head: Atraumatic. Nose: No congestion/rhinnorhea. Mouth/Throat: Mucous membranes are moist.   Neck: No stridor. Painless ROM.  Cardiovascular: Normal rate, regular rhythm. Grossly normal heart sounds.  Good peripheral circulation. Respiratory: Normal respiratory effort.  No retractions. Lungs without wheezing or crackles, good airmovement throughout. Gastrointestinal: Soft with mild epigastric ttp, no rebound or guarding. No distention. No  abdominal bruits. No CVA tenderness. Genitourinary:  Musculoskeletal: No lower extremity tenderness nor edema.  No joint effusions. Neurologic: . No gross focal neurologic deficits are appreciated.  Skin:  Skin is warm, dry and intact. No rash noted. Psychiatric: Mood and affect are normal. Speech and behavior are normal.  ____________________________________________   LABS (all labs ordered are listed, but only abnormal results are displayed)  Results for orders placed or performed during the hospital encounter of 04/23/19 (from  the past 24 hour(s))  Basic metabolic panel     Status: Abnormal   Collection Time: 04/23/19  6:16 PM  Result Value Ref Range   Sodium 142 135 - 145 mmol/L   Potassium 4.1 3.5 - 5.1 mmol/L   Chloride 102 98 - 111 mmol/L   CO2 28 22 - 32 mmol/L   Glucose, Bld 118 (H) 70 - 99 mg/dL   BUN 13 6 - 20 mg/dL   Creatinine, Ser 0.84 0.44 - 1.00 mg/dL   Calcium 8.9 8.9 - 10.3 mg/dL   GFR calc non Af Amer >60 >60 mL/min   GFR calc Af Amer >60 >60 mL/min   Anion gap 12 5 - 15  CBC     Status: Abnormal   Collection Time: 04/23/19  6:16 PM  Result Value Ref Range   WBC 4.5 4.0 - 10.5 K/uL   RBC 3.73 (L) 3.87 - 5.11 MIL/uL   Hemoglobin 13.0 12.0 - 15.0 g/dL   HCT 37.6 36.0 - 46.0 %   MCV 100.8 (H) 80.0 - 100.0 fL   MCH 34.9 (H) 26.0 - 34.0 pg   MCHC 34.6 30.0 - 36.0 g/dL   RDW 14.1 11.5 - 15.5 %   Platelets 357 150 - 400 K/uL   nRBC 0.0 0.0 - 0.2 %  Troponin I (High Sensitivity)     Status: None   Collection Time: 04/23/19  6:16 PM  Result Value Ref Range   Troponin I (High Sensitivity) 8 <18 ng/L  Hepatic function panel     Status: Abnormal   Collection Time: 04/23/19  6:16 PM  Result Value Ref Range   Total Protein 7.3 6.5 - 8.1 g/dL   Albumin 3.1 (L) 3.5 - 5.0 g/dL   AST 36 15 - 41 U/L   ALT 18 0 - 44 U/L   Alkaline Phosphatase 67 38 - 126 U/L   Total Bilirubin 0.4 0.3 - 1.2 mg/dL   Bilirubin, Direct <0.1 0.0 - 0.2 mg/dL   Indirect Bilirubin NOT  CALCULATED 0.3 - 0.9 mg/dL  Lipase, blood     Status: None   Collection Time: 04/23/19  6:16 PM  Result Value Ref Range   Lipase 28 11 - 51 U/L  POC SARS Coronavirus 2 Ag     Status: Abnormal   Collection Time: 04/23/19  9:12 PM  Result Value Ref Range   SARS Coronavirus 2 Ag POSITIVE (A) NEGATIVE   ____________________________________________  EKG My review and personal interpretation at Time: 18:19   Indication: cough  Rate: 75  Rhythm: sinus Axis: normal Other: lbbb, nonspecific st abn, no stemi ____________________________________________  RADIOLOGY  I personally reviewed all radiographic images ordered to evaluate for the above acute complaints and reviewed radiology reports and findings.  These findings were personally discussed with the patient.  Please see medical record for radiology report.  ____________________________________________   PROCEDURES  Procedure(s) performed:  Procedures    Critical Care performed: no ____________________________________________   INITIAL IMPRESSION / ASSESSMENT AND PLAN / ED COURSE  Pertinent labs & imaging results that were available during my care of the patient were reviewed by me and considered in my medical decision making (see chart for details).   DDX: Pneumonia, Covid, bronchitis, enteritis, gastritis, flu  KASSIDEE NARCISO is a 48 y.o. who presents to the ED with symptoms as described above.  Patient arrives afebrile and nontoxic-appearing.  Reporting cough and posttussive emesis with chest x-ray showing evidence of patchy which is concerning for viral  pneumonia as she lives in a group home setting certainly concerning and high risk for COVID-19.  The patient will be placed on continuous pulse oximetry and telemetry for monitoring.  Laboratory evaluation will be sent to evaluate for the above complaints.     Clinical Course as of Apr 22 2145  Mon Apr 23, 2019  2124 Patient has Covid positive but not requiring any  oxygen.  Does not meet criteria for sepsis.  She is tolerating p.o.  Symptoms improved after Zofran.  Will give Decadron she does have history of asthma but only appreciate any significant wheezing at this time.  Discussed conservative management.  She does have home pulse oximeter which they will continue to monitor.  Discussed signs and symptoms for which she should return to the ER.   [PR]    Clinical Course User Index [PR] Merlyn Lot, MD    The patient was evaluated in Emergency Department today for the symptoms described in the history of present illness. He/she was evaluated in the context of the global COVID-19 pandemic, which necessitated consideration that the patient might be at risk for infection with the SARS-CoV-2 virus that causes COVID-19. Institutional protocols and algorithms that pertain to the evaluation of patients at risk for COVID-19 are in a state of rapid change based on information released by regulatory bodies including the CDC and federal and state organizations. These policies and algorithms were followed during the patient's care in the ED.  As part of my medical decision making, I reviewed the following data within the [place] notes reviewed and incorporated, Labs reviewed, notes from prior ED visits and [place] Controlled Substance Database   ____________________________________________   FINAL CLINICAL IMPRESSION(S) / ED DIAGNOSES  Final diagnoses:  Upper respiratory tract infection, unspecified type  COVID-19 virus infection      NEW MEDICATIONS STARTED DURING THIS VISIT:  New Prescriptions   ALBUTEROL (VENTOLIN HFA) 108 (90 BASE) MCG/ACT INHALER    Inhale 2 puffs into the lungs every 6 (six) hours as needed for wheezing or shortness of breath.   BENZONATATE (TESSALON PERLES) 100 MG CAPSULE    Take 1 capsule (100 mg total) by mouth every 6 (six) hours as needed for cough.   ONDANSETRON (ZOFRAN) 4 MG TABLET    Take 1 tablet (4  mg total) by mouth daily as needed.     Note:  This document was prepared using Dragon voice recognition software and may include unintentional dictation errors.    Merlyn Lot, MD 04/23/19 2146

## 2019-04-23 NOTE — ED Notes (Signed)
Pt ambulated in room with continuous pulse ox. Sat remained 99-100% during ambulation. EDP Quentin Cornwall notified.

## 2019-04-27 ENCOUNTER — Telehealth: Payer: Self-pay | Admitting: Internal Medicine

## 2019-04-27 DIAGNOSIS — U071 COVID-19: Secondary | ICD-10-CM

## 2019-04-27 NOTE — Telephone Encounter (Signed)
Patient mom [name] called and wanted to let Dr. Derrel Nip know that patient tested positive for covid and she has a really bad cough and wants to know if she can give her something for this. Please call pt mom back, thanks.

## 2019-04-30 DIAGNOSIS — U071 COVID-19: Secondary | ICD-10-CM | POA: Insufficient documentation

## 2019-04-30 HISTORY — DX: COVID-19: U07.1

## 2019-04-30 MED ORDER — CHERATUSSIN AC 100-10 MG/5ML PO SOLN: 5.0000 mL | Freq: Three times a day (TID) | ORAL | 0 refills | Status: DC | PRN

## 2019-04-30 NOTE — Telephone Encounter (Signed)
cheratussin sent to [facility]

## 2019-04-30 NOTE — Telephone Encounter (Signed)
Notified Roque Cash at facility.

## 2019-04-30 NOTE — Telephone Encounter (Signed)
Patient having bad cough tested positive for COVID at ER on 04/23/19, patient received nebulizer medication and tessalon perles but the cough is still keeping her up at night and al through the day she is coughing facility [facility] requesting something more for cough.

## 2019-05-29 ENCOUNTER — Telehealth: Payer: Self-pay | Admitting: Internal Medicine

## 2019-05-29 NOTE — Telephone Encounter (Signed)
Cindy Oconnor called from [facility] regarding med prescribe no longer covered fax was received on 12/29.

## 2019-05-29 NOTE — Telephone Encounter (Signed)
err

## 2019-06-14 ENCOUNTER — Other Ambulatory Visit: Payer: Self-pay

## 2019-06-14 MED ORDER — OMEPRAZOLE 20 MG PO CPDR: DELAYED_RELEASE_CAPSULE | ORAL | 5 refills | Status: DC

## 2019-06-14 MED ORDER — MONTELUKAST SODIUM 10 MG PO TABS: 10.0000 mg | ORAL_TABLET | Freq: Every day | ORAL | 1 refills | Status: DC

## 2019-06-25 ENCOUNTER — Ambulatory Visit: Payer: Medicare Other | Admitting: Internal Medicine

## 2019-06-27 ENCOUNTER — Ambulatory Visit: Payer: Medicare Other | Admitting: Internal Medicine

## 2019-07-01 ENCOUNTER — Telehealth: Payer: Self-pay | Admitting: Internal Medicine

## 2019-07-01 ENCOUNTER — Other Ambulatory Visit: Payer: Self-pay | Admitting: Internal Medicine

## 2019-07-01 MED ORDER — AZELASTINE HCL 0.05 % OP SOLN: OPHTHALMIC | 12 refills | Status: DC

## 2019-07-01 NOTE — Telephone Encounter (Signed)
Request from facility for alternative to current eye drop due to cost.  Previous eye drop was azelastine,  I have refilled it and sent to [facility]

## 2019-07-04 NOTE — Telephone Encounter (Signed)
Placed in quick sign folder.  

## 2019-07-04 NOTE — Telephone Encounter (Signed)
One signature is missing, front page only. Envelope is up front in [place]'s color folder. Please call when ready.

## 2019-07-04 NOTE — Telephone Encounter (Signed)
Spoke with Cindy Oconnor and informed her and also informed her of the paperwork being complete and ready for pick up.

## 2019-07-05 ENCOUNTER — Other Ambulatory Visit: Payer: Self-pay

## 2019-07-05 ENCOUNTER — Encounter: Payer: Self-pay | Admitting: Internal Medicine

## 2019-07-05 ENCOUNTER — Ambulatory Visit (INDEPENDENT_AMBULATORY_CARE_PROVIDER_SITE_OTHER): Payer: Medicare Other | Admitting: Internal Medicine

## 2019-07-05 DIAGNOSIS — E034 Atrophy of thyroid (acquired): Secondary | ICD-10-CM | POA: Diagnosis not present

## 2019-07-05 DIAGNOSIS — U071 COVID-19: Secondary | ICD-10-CM | POA: Diagnosis not present

## 2019-07-05 DIAGNOSIS — M1711 Unilateral primary osteoarthritis, right knee: Secondary | ICD-10-CM | POA: Diagnosis not present

## 2019-07-05 NOTE — Assessment & Plan Note (Addendum)
S/p total knee replacement. Continue use of tylenol scheduled prn diclofenac gel.

## 2019-07-05 NOTE — Assessment & Plan Note (Signed)
Thyroid function has been  WNL on current dose. Repeat level is due   Lab Results  Component Value Date   TSH 3.26 08/17/2018

## 2019-07-05 NOTE — Assessment & Plan Note (Signed)
TREATED IN ED for cough ,  Diagnosed with COVID 19 ,  WAS NOT NOT HYPOXIC  ,  Received Decadron and Zofran for post tussive emesis.  Has already received dose 1 of Moderna vaccine.

## 2019-07-05 NOTE — Progress Notes (Signed)
Virtual Visit via [email or web address]  This visit type was conducted due to national recommendations for restrictions regarding the COVID-19 pandemic (e.g. social distancing).  This format is felt to be most appropriate for this patient at this time.  All issues noted in this document were discussed and addressed.  No physical exam was performed (except for noted visual exam findings with Video Visits).   I connected with@ on 07/05/19 at 12:00 PM EST by a video enabled telemedicine application  and verified that I am speaking with the correct person using two identifiers. Location patient: home Location provider: work or home office Persons participating in the virtual visit: patient, provider  I discussed the limitations, risks, security and privacy concerns of performing an evaluation and management service by telephone and the availability of in person appointments. I also discussed with the patient that there may be a patient responsible charge related to this service. The patient expressed understanding and agreed to proceed.  Reason for visit: follow up   HPI:  49 yr old female with Down's Syndrome,  Resident in a group home ,  Here for follow up .   Had a mild COVID 19 INFECTION in [place] November  Has received #1 Moderna COVID 19 VACCINE yesterday and has a mild localized reaction  To the skin on her right arm.   ROS: See pertinent positives and negatives per HPI.  Past Medical History:  Diagnosis Date  . Arthritis   . Asthma    unspecified  . Blood clot in vein   . Chicken pox   . Colitis   . COPD (chronic obstructive pulmonary disease) ([place])   . Deafness in left ear   . Disease of thyroid gland   . Down syndrome   . DVT (deep venous thrombosis) ([place])   . Fractures   . GERD (gastroesophageal reflux disease)   . Hypothyroidism    unspecified  . 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 Positive anti-CCP antibodies, negative rheumatoid factor, positive FANA. b. Methotrexate,  Prednisone, Plaquenil.   . Joint pain   . Lupus ([place]) 1995  . Osteoarthritis 01/30/2014   a. Lumbar disc disease. b. Trigger nodules  . Osteoporosis 01/30/2014   a. [place]  . RA (rheumatoid arthritis) ([place])   . Shingles   . Ulcerative (chronic) enterocolitis ([place])   . Ulcerative colitis [facility])     Past Surgical History:  Procedure Laterality Date  . ABDOMINAL HYSTERECTOMY    . ABDOMINAL HYSTERECTOMY W/ PARTIAL [place]  . BUNIONECTOMY Bilateral    Bunionectomy great toe  . COLONOSCOPY  04/16/2013   Hx of ulcerative colitis - repeat 2 years per Dr. Rayann Heman  . COLONOSCOPY  09/24/2003   Dr. Brita Romp  . FOOT SURGERY Bilateral    x2  . HALLUX VALGUS CORRECTION    . KNEE ARTHROPLASTY Left 01/10/2017   Procedure: COMPUTER ASSISTED TOTAL KNEE ARTHROPLASTY;  Surgeon: Dereck Leep, MD;  Location: [facility] ORS;  Service: Orthopedics;  Laterality: Left;  . KNEE ARTHROPLASTY Right 08/10/2017   Procedure: COMPUTER ASSISTED TOTAL KNEE ARTHROPLASTY;  Surgeon: Dereck Leep, MD;  Location: [facility] ORS;  Service: Orthopedics;  Laterality: Right;    Family History  Problem Relation Age of Onset  . Heart disease Mother   . Diabetes Mother   . Breast cancer Mother 3  . Heart disease Father   . Diabetes Father   . Alcohol abuse Maternal Grandmother   . Arthritis Maternal Grandmother   . Stroke Maternal Grandmother   .  Diabetes Maternal Grandmother   . Alcohol abuse Maternal Grandfather   . Arthritis Maternal Grandfather   . Stroke Maternal Grandfather   . Diabetes Maternal Grandfather     SOCIAL HX:  reports that she has never smoked. She has never used smokeless tobacco. She reports that she does not drink alcohol or use drugs.   Current Outpatient Medications:  .  acetaminophen (TYLENOL) 325 MG tablet, Take 2 tablets (650 mg total) by mouth 3 (three) times daily as needed., Disp: 90 tablet, Rfl: 5 .  albuterol (VENTOLIN HFA) 108 (90 Base) MCG/ACT inhaler, Inhale 2 puffs into  the lungs every 6 (six) hours as needed for wheezing or shortness of breath., Disp: 8 g, Rfl: 0 .  alendronate (FOSAMAX) 70 MG tablet, TAKE 1 TABLET BY MOUTH WEEKLY EARLY MORNING BEFORE FOOD/MEDS WITH WATER DO NOT LIE DOWN FOR 30 MINUTES, Disp: 4 tablet, Rfl: 5 .  amoxicillin-clavulanate (AUGMENTIN) 875-125 MG tablet, 1 TABLET 1 HOUR PRIOR TO DENTAL PROCEDURE AND ONE TABLET 12 HOURS LATER, Disp: 6 tablet, Rfl: 0 .  azelastine (OPTIVAR) 0.05 % ophthalmic solution, PLACE 1 DROP INTO BOTH EYES 2 TIMES PER DAY., Disp: 6 mL, Rfl: 12 .  benzonatate (TESSALON PERLES) 100 MG capsule, Take 1 capsule (100 mg total) by mouth every 6 (six) hours as needed for cough., Disp: 30 capsule, Rfl: 0 .  benzonatate (TESSALON) 200 MG capsule, TAKE 1 CAPSULE BY MOUTH 3 TIMES PER DAY FOR 10 DAYS, Disp: 30 capsule, Rfl: 1 .  budesonide-formoterol (SYMBICORT) 160-4.5 MCG/ACT inhaler, INHALE 2 PUFFS ONCE DAILY (1-2 MINUTES BETWEEN PUFFS), Disp: 10.2 g, Rfl: 11 .  calcium-vitamin D (OSCAL WITH D) 500-200 MG-UNIT TABS tablet, TAKEK 1 TABLET BY MOUTH EVERY DAY FOR SUPPLEMENT, Disp: 30 tablet, Rfl: 5 .  clotrimazole-betamethasone (LOTRISONE) cream, APPLY TOPICALLY 2 TIMES PER DAY, Disp: 45 g, Rfl: 2 .  cyanocobalamin (,VITAMIN B-12,) 1000 MCG/ML injection, GIVE 1000 MCG INJECTION "IM" ONCE A MONTH (B-12 SUPPLEMENT), Disp: 1 mL, Rfl: 11 .  D3-1000 25 MCG (1000 UT) tablet, TAKEK 1 TABLET BY MOUTH EVERY DAY FOR SUPPLEMENT, Disp: 30 tablet, Rfl: 5 .  diclofenac sodium (VOLTAREN) 1 % GEL, Apply 2 g topically 4 (four) times daily., Disp: , Rfl:  .  docusate sodium (COLACE) 100 MG capsule, TAKE 1 CAPSULE BY MOUTH EVERY OTHER DAY, Disp: 15 capsule, Rfl: 2 .  folic acid (FOLVITE) 1 MG tablet, TAKE 1 TABLET BY MOUTH EVERY DAY, Disp: 30 tablet, Rfl: 2 .  guaiFENesin-codeine (CHERATUSSIN AC) 100-10 MG/5ML syrup, Take 5 mLs by mouth 3 (three) times daily as needed for cough., Disp: 180 mL, Rfl: 0 .  ketoconazole (NIZORAL) 2 % cream, APPLY  TOPICALLY EVERY DAY, Disp: 60 g, Rfl: 2 .  lactulose (CHRONULAC) 10 GM/15ML solution, Take 30 g by mouth 2 (two) times daily as needed., Disp: , Rfl:  .  levalbuterol (XOPENEX) 0.63 MG/3ML nebulizer solution, Take 3 mLs (0.63 mg total) by nebulization every 6 (six) hours as needed for wheezing or shortness of breath., Disp: 3 mL, Rfl: 1 .  levothyroxine (SYNTHROID, LEVOTHROID) 125 MCG tablet, Take 1 tablet (125 mcg total) by mouth daily before breakfast., Disp: 30 tablet, Rfl: 10 .  Liniments (BIOFLEXOR) 3 % GEL, Apply to painful joints 2 to 3 times daily as needed, Disp: 135 g, Rfl: 11 .  magnesium hydroxide (MILK OF MAGNESIA) 400 MG/5ML suspension, Take 30 mLs by mouth every 4 (four) hours as needed for mild constipation., Disp: , Rfl:  .  methotrexate (RHEUMATREX) 2.5 MG tablet, TAKE 7 TABLETS (17.5 MG) BY MOUTH ONCE AWEEK ON FRIDAY. **CAUTION: CHEMOTHERAPY.PROTECT FROM LIGHT**, Disp: 28 tablet, Rfl: 3 .  montelukast (SINGULAIR) 10 MG tablet, Take 1 tablet (10 mg total) by mouth at bedtime., Disp: 90 tablet, Rfl: 1 .  Multiple Vitamins-Minerals (THEREMS-M) TABS, TAKE 1 TABLET BY MOUTH EVERY DAY FOR SUPPLEMENT, Disp: 90 tablet, Rfl: 1 .  omeprazole (PRILOSEC) 20 MG capsule, TAKE 1 CAPSULE BY MOUTH 2 TIMES PER DAY **DO NOT CRUSH**, Disp: 60 capsule, Rfl: 5 .  ondansetron (ZOFRAN) 4 MG tablet, Take 1 tablet (4 mg total) by mouth every 8 (eight) hours as needed for nausea or vomiting., Disp: 20 tablet, Rfl: 5 .  ondansetron (ZOFRAN) 4 MG tablet, Take 1 tablet (4 mg total) by mouth daily as needed., Disp: 14 tablet, Rfl: 0 .  predniSONE (DELTASONE) 2.5 MG tablet, TAKE 1 TABLET BY MOUTH EACH DAY, Disp: 30 tablet, Rfl: 11 .  Probiotic CAPS, Take 1 capsule by mouth daily. To prevent c dificile colitis, Disp: 30 capsule, Rfl: 0 .  senna (SENOKOT) 8.6 MG TABS tablet, TAKE 2 TABLETS(17.2 MG) BY MOUTH AT BEDTIME, Disp: 120 tablet, Rfl: 11 .  sulfamethoxazole-trimethoprim (BACTRIM DS) 800-160 MG tablet, Take 1  tablet by mouth 2 (two) times daily., Disp: 14 tablet, Rfl: 0 .  Syringe/Needle, Disp, (SYRINGE 3CC/25GX1") 25G X 1" 3 ML MISC, Use for b12 injections, Disp: 50 each, Rfl: 0 .  traMADol (ULTRAM) 50 MG tablet, Take 50 mg by mouth every 6 (six) hours as needed for moderate pain., Disp: , Rfl:   EXAM:  VITALS per patient if applicable:  GENERAL: alert, oriented, appears well and in no acute distress  HEENT: atraumatic, conjunttiva clear, no obvious abnormalities on inspection of external nose and ears  NECK: normal movements of the head and neck  LUNGS: on inspection no signs of respiratory distress, breathing rate appears normal, no obvious gross SOB, gasping or wheezing  CV: no obvious cyanosis  MS: moves all visible extremities without noticeable abnormality  Skin:  Mild erythema right lateral arm in the deltoid region   PSYCH/NEURO: pleasant and cooperative, no obvious depression or anxiety, speech and thought processing grossly intact  ASSESSMENT AND PLAN:  Discussed the following assessment and plan:  COVID-19 virus detected  Primary osteoarthritis of right knee  Hypothyroidism due to acquired atrophy of thyroid  COVID-19 virus detected TREATED IN ED for cough ,  Diagnosed with COVID 19 ,  WAS NOT NOT HYPOXIC  ,  Received Decadron and Zofran for post tussive emesis.  Has already received dose 1 of Moderna vaccine.   Primary osteoarthritis of right knee S/p total knee replacement. Continue use of tylenol scheduled prn diclofenac gel.   Hypothyroid Thyroid function has been  WNL on current dose. Repeat level is due   Lab Results  Component Value Date   TSH 3.26 08/17/2018       I discussed the assessment and treatment plan with the patient. The patient was provided an opportunity to ask questions and all were answered. The patient agreed with the plan and demonstrated an understanding of the instructions.   The patient was advised to call back or seek an in-person  evaluation if the symptoms worsen or if the condition fails to improve as anticipated.   I provided  25 minutes of non-face-to-face time during this encounter reviewing patient's current problems and past procedures/imaging studies, providing counseling on the above mentioned problems , and coordination  of  care .   Crecencio Mc, MD

## 2019-07-16 ENCOUNTER — Other Ambulatory Visit: Payer: Self-pay

## 2019-07-16 MED ORDER — DOCUSATE SODIUM 100 MG PO CAPS: ORAL_CAPSULE | ORAL | 2 refills | Status: DC

## 2019-07-16 NOTE — Telephone Encounter (Signed)
Historical medication  Last OV: 07/05/2019 Next OV: not scheduled

## 2019-07-17 MED ORDER — TRAMADOL HCL 50 MG PO TABS: 50.0000 mg | ORAL_TABLET | Freq: Every day | ORAL | 5 refills | Status: DC | PRN

## 2019-07-24 ENCOUNTER — Other Ambulatory Visit: Payer: Self-pay

## 2019-07-24 MED ORDER — LEVOTHYROXINE SODIUM 125 MCG PO TABS: 125.0000 ug | ORAL_TABLET | Freq: Every day | ORAL | 0 refills | Status: DC

## 2019-07-27 ENCOUNTER — Telehealth: Payer: Self-pay | Admitting: Internal Medicine

## 2019-07-27 NOTE — Telephone Encounter (Signed)
Yes she can have the second vaccine

## 2019-07-27 NOTE — Telephone Encounter (Signed)
Spoke with Cindy Oconnor to let her know that the pt can receive the second covid vaccine.

## 2019-07-27 NOTE — Telephone Encounter (Signed)
Cindy Oconnor from [facility] [employer] called. Pt received first covid vaccine on 07/04/19 and arm was swollen, red, and had a knot. They want to know if she can get second vaccine scheduled on 08/02/19? Please advise. [phone]

## 2019-08-21 ENCOUNTER — Telehealth: Payer: Self-pay

## 2019-08-21 ENCOUNTER — Other Ambulatory Visit: Payer: Self-pay

## 2019-08-21 DIAGNOSIS — E669 Obesity, unspecified: Secondary | ICD-10-CM

## 2019-08-21 DIAGNOSIS — M818 Other osteoporosis without current pathological fracture: Secondary | ICD-10-CM

## 2019-08-21 NOTE — Telephone Encounter (Signed)
Received a refill request for Fosamax, looks like pt has been on medication since 2012.

## 2019-08-21 NOTE — Telephone Encounter (Signed)
It's time to stop the alendronate if she has been taking it for 8 years  And repeat her DEXa,  Please notify POA of need for change . DEXA ordered

## 2019-08-23 NOTE — Telephone Encounter (Signed)
Spoke with caregiver at group home to let her know that they need to stop the fosamax and schedule her a dexa scan. Caregiver got the information and phone to schedule the appt for pt.

## 2019-09-05 ENCOUNTER — Telehealth: Payer: Self-pay

## 2019-09-05 MED ORDER — DICLOFENAC SODIUM 1 % EX GEL: 2.0000 g | Freq: Four times a day (QID) | CUTANEOUS | 6 refills | Status: DC

## 2019-09-05 NOTE — Addendum Note (Signed)
Addended by: Crecencio Mc on: 09/05/2019 01:29 PM   Modules accepted: Orders

## 2019-09-05 NOTE — Telephone Encounter (Signed)
Received a refill request from [facility] for Diclofenac gel 1%, this medication is a historical medication.

## 2019-09-05 NOTE — Telephone Encounter (Signed)
Refilled and sent

## 2019-09-14 ENCOUNTER — Ambulatory Visit (INDEPENDENT_AMBULATORY_CARE_PROVIDER_SITE_OTHER): Payer: Medicare Other | Admitting: Internal Medicine

## 2019-09-14 ENCOUNTER — Other Ambulatory Visit: Payer: Self-pay | Admitting: Internal Medicine

## 2019-09-14 ENCOUNTER — Other Ambulatory Visit: Payer: Self-pay

## 2019-09-14 ENCOUNTER — Encounter: Payer: Self-pay | Admitting: Internal Medicine

## 2019-09-14 DIAGNOSIS — H6123 Impacted cerumen, bilateral: Secondary | ICD-10-CM | POA: Insufficient documentation

## 2019-09-14 MED ORDER — DEBROX 6.5 % OT SOLN: 5.0000 [drp] | Freq: Two times a day (BID) | OTIC | 0 refills | Status: DC

## 2019-09-14 NOTE — Assessment & Plan Note (Signed)
Debrox liquid bilaterally qhs while awaiting ENT appt for irrigation

## 2019-09-14 NOTE — Progress Notes (Signed)
Subjective:  Patient ID: Cindy Oconnor, female    DOB: 28-Oct-1970  Age: 49 y.o. MRN: [id number]  CC: The encounter diagnosis was Bilateral hearing loss due to cerumen impaction.  HPI Cindy Oconnor presents for evaluation of sudden hearing loss in right ear.  This visit occurred during the SARS-CoV-2 public health emergency.  Safety protocols were in place, including screening questions prior to the visit, additional usage of staff PPE, and extensive cleaning of exam room while observing appropriate contact time as indicated for disinfecting solutions.   Cindy Oconnor,  Her aide provides history .  Got water in it during shower on Sunday.  Aid noticed reduced hearing   Outpatient Medications Prior to Visit  Medication Sig Dispense Refill  . azelastine (OPTIVAR) 0.05 % ophthalmic solution PLACE 1 DROP INTO BOTH EYES 2 TIMES PER DAY. 6 mL 12  . budesonide-formoterol (SYMBICORT) 160-4.5 MCG/ACT inhaler INHALE 2 PUFFS ONCE DAILY (1-2 MINUTES BETWEEN PUFFS) 10.2 g 11  . calcium-vitamin D (OSCAL WITH D) 500-200 MG-UNIT TABS tablet TAKEK 1 TABLET BY MOUTH EVERY DAY FOR SUPPLEMENT 30 tablet 5  . clotrimazole-betamethasone (LOTRISONE) cream APPLY TOPICALLY 2 TIMES PER DAY 45 g 2  . cyanocobalamin (,VITAMIN B-12,) 1000 MCG/ML injection GIVE 1000 MCG INJECTION "IM" ONCE A MONTH (B-12 SUPPLEMENT) 1 mL 11  . D3-1000 25 MCG (1000 UT) tablet TAKEK 1 TABLET BY MOUTH EVERY DAY FOR SUPPLEMENT 30 tablet 5  . diclofenac Sodium (VOLTAREN) 1 % GEL Apply 2 g topically 4 (four) times daily. As needed for joint pain 350 g 6  . docusate sodium (COLACE) 100 MG capsule TAKE 1 CAPSULE BY MOUTH EVERY OTHER DAY 15 capsule 2  . folic acid (FOLVITE) 1 MG tablet TAKE 1 TABLET BY MOUTH EVERY DAY 30 tablet 2  . ketoconazole (NIZORAL) 2 % cream APPLY TOPICALLY EVERY DAY 60 g 2  . levothyroxine (SYNTHROID) 125 MCG tablet Take 1 tablet (125 mcg total) by mouth daily before breakfast. 90 tablet 0  . Liniments (BIOFLEXOR) 3  % GEL Apply to painful joints 2 to 3 times daily as needed 135 g 11  . methotrexate (RHEUMATREX) 2.5 MG tablet TAKE 7 TABLETS (17.5 MG) BY MOUTH ONCE AWEEK ON FRIDAY. **CAUTION: CHEMOTHERAPY.PROTECT FROM LIGHT** 28 tablet 3  . montelukast (SINGULAIR) 10 MG tablet Take 1 tablet (10 mg total) by mouth at bedtime. 90 tablet 1  . Multiple Vitamins-Minerals (THEREMS-M) TABS TAKE 1 TABLET BY MOUTH EVERY DAY FOR SUPPLEMENT 90 tablet 1  . omeprazole (PRILOSEC) 20 MG capsule TAKE 1 CAPSULE BY MOUTH 2 TIMES PER DAY **DO NOT CRUSH** 60 capsule 5  . predniSONE (DELTASONE) 2.5 MG tablet TAKE 1 TABLET BY MOUTH EACH DAY 30 tablet 11  . senna (SENOKOT) 8.6 MG TABS tablet TAKE 2 TABLETS(17.2 MG) BY MOUTH AT BEDTIME 120 tablet 11  . Syringe/Needle, Disp, (SYRINGE 3CC/25GX1") 25G X 1" 3 ML MISC Use for b12 injections 50 each 0  . traMADol (ULTRAM) 50 MG tablet Take 1 tablet (50 mg total) by mouth daily as needed for moderate pain. 30 tablet 5  . acetaminophen (TYLENOL) 325 MG tablet Take 2 tablets (650 mg total) by mouth 3 (three) times daily as needed. 90 tablet 5  . albuterol (VENTOLIN HFA) 108 (90 Base) MCG/ACT inhaler Inhale 2 puffs into the lungs every 6 (six) hours as needed for wheezing or shortness of breath. 8 g 0  . alendronate (FOSAMAX) 70 MG tablet TAKE 1 TABLET BY MOUTH WEEKLY EARLY MORNING BEFORE FOOD/MEDS WITH  WATER DO NOT LIE DOWN FOR 30 MINUTES (Patient not taking: Reported on 09/14/2019) 4 tablet 5  . amoxicillin-clavulanate (AUGMENTIN) 875-125 MG tablet 1 TABLET 1 HOUR PRIOR TO DENTAL PROCEDURE AND ONE TABLET 12 HOURS LATER (Patient not taking: Reported on 09/14/2019) 6 tablet 0  . Probiotic CAPS Take 1 capsule by mouth daily. To prevent c dificile colitis (Patient not taking: Reported on 09/14/2019) 30 capsule 0  . benzonatate (TESSALON PERLES) 100 MG capsule Take 1 capsule (100 mg total) by mouth every 6 (six) hours as needed for cough. (Patient not taking: Reported on 09/14/2019) 30 capsule 0  .  benzonatate (TESSALON) 200 MG capsule TAKE 1 CAPSULE BY MOUTH 3 TIMES PER DAY FOR 10 DAYS 30 capsule 1  . diclofenac sodium (VOLTAREN) 1 % GEL Apply 2 g topically 4 (four) times daily.    Marland Kitchen guaiFENesin-codeine (CHERATUSSIN AC) 100-10 MG/5ML syrup Take 5 mLs by mouth 3 (three) times daily as needed for cough. (Patient not taking: Reported on 09/14/2019) 180 mL 0  . lactulose (CHRONULAC) 10 GM/15ML solution Take 30 g by mouth 2 (two) times daily as needed.    . levalbuterol (XOPENEX) 0.63 MG/3ML nebulizer solution Take 3 mLs (0.63 mg total) by nebulization every 6 (six) hours as needed for wheezing or shortness of breath. (Patient not taking: Reported on 09/14/2019) 3 mL 1  . magnesium hydroxide (MILK OF MAGNESIA) 400 MG/5ML suspension Take 30 mLs by mouth every 4 (four) hours as needed for mild constipation.    . ondansetron (ZOFRAN) 4 MG tablet Take 1 tablet (4 mg total) by mouth every 8 (eight) hours as needed for nausea or vomiting. (Patient not taking: Reported on 09/14/2019) 20 tablet 5  . ondansetron (ZOFRAN) 4 MG tablet Take 1 tablet (4 mg total) by mouth daily as needed. (Patient not taking: Reported on 09/14/2019) 14 tablet 0  . sulfamethoxazole-trimethoprim (BACTRIM DS) 800-160 MG tablet Take 1 tablet by mouth 2 (two) times daily. (Patient not taking: Reported on 09/14/2019) 14 tablet 0   No facility-administered medications prior to visit.    Review of Systems;  Patient denies headache, fevers, malaise, unintentional weight loss, skin rash, eye pain, sinus congestion and sinus pain, sore throat, dysphagia,  hemoptysis , cough, dyspnea, wheezing, chest pain, palpitations, orthopnea, edema, abdominal pain, nausea, melena, diarrhea, constipation, flank pain, dysuria, hematuria, urinary  Frequency, nocturia, numbness, tingling, seizures,  Focal weakness, Loss of consciousness,  Tremor, insomnia, depression, anxiety, and suicidal ideation.      Objective:  BP 102/86 (BP Location: Right Arm,  Patient Position: Sitting, Cuff Size: Normal)   Pulse 83   Temp 97.8 F (36.6 C) (Temporal)   Resp 15   Ht 5' 2"  (1.575 m)   Wt 158 lb 3.2 oz (71.8 kg)   LMP 04/11/1981   SpO2 96%   BMI 28.94 kg/m   BP Readings from Last 3 Encounters:  09/14/19 102/86  07/05/19 103/82  04/23/19 (!) 105/57    Wt Readings from Last 3 Encounters:  09/14/19 158 lb 3.2 oz (71.8 kg)  07/05/19 159 lb (72.1 kg)  04/23/19 156 lb 4.9 oz (70.9 kg)    General appearance: alert, cooperative and appears stated age Ears:Bilateral cerumen impaction noted on exam without erythema or cervical LAD   Throat: lips, mucosa, and tongue normal; teeth and gums normal Neck: no adenopathy, no carotid bruit, supple, symmetrical, trachea midline and thyroid not enlarged, symmetric, no tenderness/mass/nodules Back: symmetric, no curvature. ROM normal. No CVA tenderness. Lungs: clear to auscultation  bilaterally Heart: regular rate and rhythm, S1, S2 normal, no murmur, click, rub or gallop Abdomen: soft, non-tender; bowel sounds normal; no masses,  no organomegaly Pulses: 2+ and symmetric Skin: Skin color, texture, turgor normal. No rashes or lesions Lymph nodes: Cervical, supraclavicular, and axillary nodes normal. ExtL bilateral swan neck deformities and PIP swelling   Lab Results  Component Value Date   HGBA1C 5.6 08/17/2018   HGBA1C 5.5 12/28/2017   HGBA1C 5.7 06/27/2013    Lab Results  Component Value Date   CREATININE 0.84 04/23/2019   CREATININE 0.80 08/17/2018   CREATININE 0.85 12/28/2017    Lab Results  Component Value Date   WBC 4.5 04/23/2019   HGB 13.0 04/23/2019   HCT 37.6 04/23/2019   PLT 357 04/23/2019   GLUCOSE 118 (H) 04/23/2019   CHOL 174 05/19/2015   TRIG 97.0 05/19/2015   HDL 47.50 05/19/2015   LDLCALC 107 (H) 05/19/2015   ALT 18 04/23/2019   AST 36 04/23/2019   NA 142 04/23/2019   K 4.1 04/23/2019   CL 102 04/23/2019   CREATININE 0.84 04/23/2019   BUN 13 04/23/2019   CO2 28  04/23/2019   TSH 3.26 08/17/2018   INR 0.95 07/28/2017   HGBA1C 5.6 08/17/2018    DG Chest 2 View  Result Date: 04/23/2019 CLINICAL DATA:  Productive cough EXAM: CHEST - 2 VIEW COMPARISON:  06/23/2018 FINDINGS: Hyperinflated lung with central airways thickening. Patchy vague foci of airspace disease. Normal heart size. No pneumothorax. Kyphosis at the thoracolumbar junction IMPRESSION: Bilateral interstitial and vague foci of patchy airspace disease, possible multifocal infection, to include atypical or viral pneumonia. Electronically Signed   By: Donavan Foil M.D.   On: 04/23/2019 18:50    Assessment & Plan:   Problem List Items Addressed This Visit      Unprioritized   Bilateral hearing loss due to cerumen impaction    Debrox liquid bilaterally qhs while awaiting ENT appt for irrigation      Relevant Orders   Ambulatory referral to ENT      I have discontinued Milani L. Wofford's ondansetron, lactulose, magnesium hydroxide, sulfamethoxazole-trimethoprim, benzonatate, levalbuterol, ondansetron, benzonatate, and Cheratussin AC. I am also having her start on Debrox. Additionally, I am having her maintain her SYRINGE 3CC/25GX1", Bioflexor, clotrimazole-betamethasone, methotrexate, alendronate, calcium-vitamin D, folic acid, predniSONE, D3-1000, cyanocobalamin, ketoconazole, senna, Therems-M, Probiotic, amoxicillin-clavulanate, budesonide-formoterol, acetaminophen, albuterol, montelukast, omeprazole, azelastine, traMADol, docusate sodium, levothyroxine, and diclofenac Sodium.  Meds ordered this encounter  Medications  . carbamide peroxide (DEBROX) 6.5 % OTIC solution    Sig: Place 5 drops into both ears 2 (two) times daily.    Dispense:  15 mL    Refill:  0    Medications Discontinued During This Encounter  Medication Reason  . diclofenac sodium (VOLTAREN) 1 % GEL Duplicate  . benzonatate (TESSALON PERLES) 100 MG capsule Completed Course  . benzonatate (TESSALON) 200 MG  capsule Completed Course  . guaiFENesin-codeine (CHERATUSSIN AC) 100-10 MG/5ML syrup Completed Course  . lactulose (CHRONULAC) 10 GM/15ML solution Patient has not taken in last 30 days  . levalbuterol (XOPENEX) 0.63 MG/3ML nebulizer solution Patient has not taken in last 30 days  . magnesium hydroxide (MILK OF MAGNESIA) 400 MG/5ML suspension Patient has not taken in last 30 days  . ondansetron (ZOFRAN) 4 MG tablet Patient has not taken in last 30 days  . ondansetron (ZOFRAN) 4 MG tablet Patient has not taken in last 30 days  . sulfamethoxazole-trimethoprim (BACTRIM DS) 800-160 MG tablet Completed  Course    Follow-up: No follow-ups on file.   Crecencio Mc, MD

## 2019-09-19 DIAGNOSIS — H6123 Impacted cerumen, bilateral: Secondary | ICD-10-CM | POA: Diagnosis not present

## 2019-09-19 DIAGNOSIS — H902 Conductive hearing loss, unspecified: Secondary | ICD-10-CM | POA: Diagnosis not present

## 2019-09-20 ENCOUNTER — Telehealth: Payer: Self-pay | Admitting: Internal Medicine

## 2019-09-20 MED ORDER — DICLOFENAC SODIUM 1 % EX GEL: 2.0000 g | Freq: Four times a day (QID) | CUTANEOUS | 6 refills | Status: DC

## 2019-09-20 NOTE — Telephone Encounter (Signed)
Pt needs a refill on diclofenac Sodium (VOLTAREN) 1 % GEL

## 2019-09-20 NOTE — Telephone Encounter (Signed)
Medication has been refilled.

## 2019-10-02 ENCOUNTER — Encounter: Payer: Self-pay | Admitting: Internal Medicine

## 2019-10-02 ENCOUNTER — Other Ambulatory Visit: Payer: Self-pay | Admitting: Internal Medicine

## 2019-10-18 ENCOUNTER — Telehealth: Payer: Self-pay | Admitting: Internal Medicine

## 2019-10-18 DIAGNOSIS — Z780 Asymptomatic menopausal state: Secondary | ICD-10-CM

## 2019-10-18 DIAGNOSIS — M818 Other osteoporosis without current pathological fracture: Secondary | ICD-10-CM

## 2019-10-18 NOTE — Telephone Encounter (Signed)
Pt needs referral for bone density test at [facility]. Please call Scientist, clinical (histocompatibility and immunogenetics) at [facility] at [phone]

## 2019-10-30 ENCOUNTER — Other Ambulatory Visit: Payer: Self-pay | Admitting: Internal Medicine

## 2019-11-09 ENCOUNTER — Other Ambulatory Visit: Payer: Self-pay | Admitting: Internal Medicine

## 2019-11-09 DIAGNOSIS — Z1231 Encounter for screening mammogram for malignant neoplasm of breast: Secondary | ICD-10-CM

## 2019-11-13 DIAGNOSIS — M810 Age-related osteoporosis without current pathological fracture: Secondary | ICD-10-CM | POA: Diagnosis not present

## 2019-11-13 DIAGNOSIS — Z79899 Other long term (current) drug therapy: Secondary | ICD-10-CM | POA: Diagnosis not present

## 2019-11-13 DIAGNOSIS — M1990 Unspecified osteoarthritis, unspecified site: Secondary | ICD-10-CM | POA: Diagnosis not present

## 2019-11-20 ENCOUNTER — Other Ambulatory Visit: Payer: Self-pay | Admitting: Internal Medicine

## 2019-11-23 ENCOUNTER — Other Ambulatory Visit: Payer: Self-pay

## 2019-11-26 ENCOUNTER — Ambulatory Visit
Admission: RE | Admit: 2019-11-26 | Discharge: 2019-11-26 | Disposition: A | Discharge status: Home / Self Care | Payer: Medicare Other | Source: Ambulatory Visit | Attending: Internal Medicine | Admitting: Internal Medicine

## 2019-11-26 DIAGNOSIS — Z1231 Encounter for screening mammogram for malignant neoplasm of breast: Secondary | ICD-10-CM | POA: Insufficient documentation

## 2020-01-11 ENCOUNTER — Other Ambulatory Visit: Payer: Self-pay | Admitting: Internal Medicine

## 2020-01-15 ENCOUNTER — Encounter: Payer: Self-pay | Admitting: Internal Medicine

## 2020-01-24 ENCOUNTER — Other Ambulatory Visit: Payer: Self-pay | Admitting: Internal Medicine

## 2020-01-28 ENCOUNTER — Other Ambulatory Visit: Payer: Self-pay | Admitting: Internal Medicine

## 2020-01-28 NOTE — Telephone Encounter (Signed)
Refill request, last seen 09-14-19.  Please advise.

## 2020-02-26 DIAGNOSIS — Z23 Encounter for immunization: Secondary | ICD-10-CM | POA: Diagnosis not present

## 2020-03-18 DIAGNOSIS — I447 Left bundle-branch block, unspecified: Secondary | ICD-10-CM | POA: Diagnosis not present

## 2020-03-18 DIAGNOSIS — R0789 Other chest pain: Secondary | ICD-10-CM | POA: Diagnosis not present

## 2020-03-18 DIAGNOSIS — R0689 Other abnormalities of breathing: Secondary | ICD-10-CM | POA: Diagnosis not present

## 2020-03-18 DIAGNOSIS — R0902 Hypoxemia: Secondary | ICD-10-CM | POA: Diagnosis not present

## 2020-03-18 DIAGNOSIS — R079 Chest pain, unspecified: Secondary | ICD-10-CM | POA: Diagnosis not present

## 2020-03-19 ENCOUNTER — Other Ambulatory Visit: Payer: Self-pay

## 2020-03-19 ENCOUNTER — Emergency Department: Payer: Medicare Other

## 2020-03-19 ENCOUNTER — Encounter: Payer: Self-pay | Admitting: *Deleted

## 2020-03-19 ENCOUNTER — Inpatient Hospital Stay (HOSPITAL_COMMUNITY)
Admit: 2020-03-19 | Discharge: 2020-03-19 | Disposition: A | Discharge status: Home / Self Care | Payer: Medicare Other | Attending: Physician Assistant | Admitting: Physician Assistant

## 2020-03-19 ENCOUNTER — Inpatient Hospital Stay
Admission: EM | Admit: 2020-03-19 | Discharge: 2020-03-21 | DRG: 690 | Disposition: A | Discharge status: Nursing Home (Includes ICF) | Payer: Medicare Other | Attending: Internal Medicine | Admitting: Internal Medicine

## 2020-03-19 DIAGNOSIS — J449 Chronic obstructive pulmonary disease, unspecified: Secondary | ICD-10-CM | POA: Diagnosis present

## 2020-03-19 DIAGNOSIS — I313 Pericardial effusion (noninflammatory): Secondary | ICD-10-CM | POA: Diagnosis present

## 2020-03-19 DIAGNOSIS — Z8616 Personal history of COVID-19: Secondary | ICD-10-CM | POA: Diagnosis not present

## 2020-03-19 DIAGNOSIS — M1990 Unspecified osteoarthritis, unspecified site: Secondary | ICD-10-CM | POA: Diagnosis present

## 2020-03-19 DIAGNOSIS — M06041 Rheumatoid arthritis without rheumatoid factor, right hand: Secondary | ICD-10-CM | POA: Diagnosis present

## 2020-03-19 DIAGNOSIS — Q909 Down syndrome, unspecified: Secondary | ICD-10-CM

## 2020-03-19 DIAGNOSIS — I3139 Other pericardial effusion (noninflammatory): Secondary | ICD-10-CM | POA: Diagnosis present

## 2020-03-19 DIAGNOSIS — K5900 Constipation, unspecified: Secondary | ICD-10-CM | POA: Diagnosis present

## 2020-03-19 DIAGNOSIS — J45909 Unspecified asthma, uncomplicated: Secondary | ICD-10-CM | POA: Diagnosis present

## 2020-03-19 DIAGNOSIS — K5730 Diverticulosis of large intestine without perforation or abscess without bleeding: Secondary | ICD-10-CM | POA: Diagnosis not present

## 2020-03-19 DIAGNOSIS — Z79890 Hormone replacement therapy (postmenopausal): Secondary | ICD-10-CM

## 2020-03-19 DIAGNOSIS — Z6830 Body mass index (BMI) 30.0-30.9, adult: Secondary | ICD-10-CM

## 2020-03-19 DIAGNOSIS — E034 Atrophy of thyroid (acquired): Secondary | ICD-10-CM | POA: Diagnosis not present

## 2020-03-19 DIAGNOSIS — M419 Scoliosis, unspecified: Secondary | ICD-10-CM | POA: Diagnosis not present

## 2020-03-19 DIAGNOSIS — R079 Chest pain, unspecified: Secondary | ICD-10-CM | POA: Diagnosis not present

## 2020-03-19 DIAGNOSIS — K760 Fatty (change of) liver, not elsewhere classified: Secondary | ICD-10-CM | POA: Diagnosis present

## 2020-03-19 DIAGNOSIS — Z885 Allergy status to narcotic agent status: Secondary | ICD-10-CM

## 2020-03-19 DIAGNOSIS — N390 Urinary tract infection, site not specified: Principal | ICD-10-CM | POA: Diagnosis present

## 2020-03-19 DIAGNOSIS — M0600 Rheumatoid arthritis without rheumatoid factor, unspecified site: Secondary | ICD-10-CM | POA: Diagnosis present

## 2020-03-19 DIAGNOSIS — M329 Systemic lupus erythematosus, unspecified: Secondary | ICD-10-CM | POA: Diagnosis present

## 2020-03-19 DIAGNOSIS — B9620 Unspecified Escherichia coli [E. coli] as the cause of diseases classified elsewhere: Secondary | ICD-10-CM | POA: Diagnosis present

## 2020-03-19 DIAGNOSIS — I959 Hypotension, unspecified: Secondary | ICD-10-CM | POA: Diagnosis present

## 2020-03-19 DIAGNOSIS — K219 Gastro-esophageal reflux disease without esophagitis: Secondary | ICD-10-CM | POA: Diagnosis present

## 2020-03-19 DIAGNOSIS — Z86718 Personal history of other venous thrombosis and embolism: Secondary | ICD-10-CM | POA: Diagnosis not present

## 2020-03-19 DIAGNOSIS — M810 Age-related osteoporosis without current pathological fracture: Secondary | ICD-10-CM | POA: Diagnosis present

## 2020-03-19 DIAGNOSIS — Z96653 Presence of artificial knee joint, bilateral: Secondary | ICD-10-CM | POA: Diagnosis present

## 2020-03-19 DIAGNOSIS — E882 Lipomatosis, not elsewhere classified: Secondary | ICD-10-CM | POA: Diagnosis not present

## 2020-03-19 DIAGNOSIS — I447 Left bundle-branch block, unspecified: Secondary | ICD-10-CM | POA: Diagnosis present

## 2020-03-19 DIAGNOSIS — J4540 Moderate persistent asthma, uncomplicated: Secondary | ICD-10-CM | POA: Diagnosis present

## 2020-03-19 DIAGNOSIS — Z79899 Other long term (current) drug therapy: Secondary | ICD-10-CM | POA: Diagnosis not present

## 2020-03-19 DIAGNOSIS — J9811 Atelectasis: Secondary | ICD-10-CM | POA: Diagnosis not present

## 2020-03-19 DIAGNOSIS — R6520 Severe sepsis without septic shock: Secondary | ICD-10-CM | POA: Diagnosis not present

## 2020-03-19 DIAGNOSIS — E039 Hypothyroidism, unspecified: Secondary | ICD-10-CM | POA: Diagnosis present

## 2020-03-19 DIAGNOSIS — M1380 Other specified arthritis, unspecified site: Secondary | ICD-10-CM | POA: Diagnosis present

## 2020-03-19 DIAGNOSIS — E663 Overweight: Secondary | ICD-10-CM | POA: Diagnosis present

## 2020-03-19 DIAGNOSIS — Z90710 Acquired absence of both cervix and uterus: Secondary | ICD-10-CM

## 2020-03-19 DIAGNOSIS — R451 Restlessness and agitation: Secondary | ICD-10-CM | POA: Diagnosis present

## 2020-03-19 DIAGNOSIS — Z7952 Long term (current) use of systemic steroids: Secondary | ICD-10-CM

## 2020-03-19 DIAGNOSIS — R1084 Generalized abdominal pain: Secondary | ICD-10-CM | POA: Diagnosis not present

## 2020-03-19 DIAGNOSIS — R0602 Shortness of breath: Secondary | ICD-10-CM | POA: Diagnosis not present

## 2020-03-19 DIAGNOSIS — R0902 Hypoxemia: Secondary | ICD-10-CM

## 2020-03-19 DIAGNOSIS — H9192 Unspecified hearing loss, left ear: Secondary | ICD-10-CM | POA: Diagnosis present

## 2020-03-19 DIAGNOSIS — R112 Nausea with vomiting, unspecified: Secondary | ICD-10-CM

## 2020-03-19 DIAGNOSIS — Z20822 Contact with and (suspected) exposure to covid-19: Secondary | ICD-10-CM | POA: Diagnosis present

## 2020-03-19 DIAGNOSIS — Z7983 Long term (current) use of bisphosphonates: Secondary | ICD-10-CM

## 2020-03-19 DIAGNOSIS — J4541 Moderate persistent asthma with (acute) exacerbation: Secondary | ICD-10-CM | POA: Diagnosis not present

## 2020-03-19 DIAGNOSIS — Z888 Allergy status to other drugs, medicaments and biological substances status: Secondary | ICD-10-CM

## 2020-03-19 DIAGNOSIS — Z881 Allergy status to other antibiotic agents status: Secondary | ICD-10-CM

## 2020-03-19 DIAGNOSIS — A419 Sepsis, unspecified organism: Secondary | ICD-10-CM | POA: Diagnosis not present

## 2020-03-19 DIAGNOSIS — Z79891 Long term (current) use of opiate analgesic: Secondary | ICD-10-CM

## 2020-03-19 DIAGNOSIS — Z7951 Long term (current) use of inhaled steroids: Secondary | ICD-10-CM

## 2020-03-19 DIAGNOSIS — M06042 Rheumatoid arthritis without rheumatoid factor, left hand: Secondary | ICD-10-CM | POA: Diagnosis not present

## 2020-03-19 DIAGNOSIS — Z8249 Family history of ischemic heart disease and other diseases of the circulatory system: Secondary | ICD-10-CM

## 2020-03-19 DIAGNOSIS — K5100 Ulcerative (chronic) pancolitis without complications: Secondary | ICD-10-CM | POA: Diagnosis present

## 2020-03-19 HISTORY — DX: Systemic involvement of connective tissue, unspecified: M35.9

## 2020-03-19 LAB — CBC WITH DIFFERENTIAL/PLATELET
Abs Immature Granulocytes: 0.03 10*3/uL (ref 0.00–0.07)
Abs Immature Granulocytes: 0.03 10*3/uL (ref 0.00–0.07)
Basophils Absolute: 0 10*3/uL (ref 0.0–0.1)
Basophils Absolute: 0.1 10*3/uL (ref 0.0–0.1)
Basophils Relative: 0 %
Basophils Relative: 1 %
Eosinophils Absolute: 0 10*3/uL (ref 0.0–0.5)
Eosinophils Absolute: 0.1 10*3/uL (ref 0.0–0.5)
Eosinophils Relative: 0 %
Eosinophils Relative: 1 %
HCT: 37.7 % (ref 36.0–46.0)
HCT: 38.5 % (ref 36.0–46.0)
Hemoglobin: 12.7 g/dL (ref 12.0–15.0)
Hemoglobin: 12.8 g/dL (ref 12.0–15.0)
Immature Granulocytes: 0 %
Immature Granulocytes: 0 %
Lymphocytes Relative: 10 %
Lymphocytes Relative: 12 %
Lymphs Abs: 0.9 10*3/uL (ref 0.7–4.0)
Lymphs Abs: 1.1 10*3/uL (ref 0.7–4.0)
MCH: 36 pg — ABNORMAL HIGH (ref 26.0–34.0)
MCH: 36.3 pg — ABNORMAL HIGH (ref 26.0–34.0)
MCHC: 33 g/dL (ref 30.0–36.0)
MCHC: 34 g/dL (ref 30.0–36.0)
MCV: 106.8 fL — ABNORMAL HIGH (ref 80.0–100.0)
MCV: 109.1 fL — ABNORMAL HIGH (ref 80.0–100.0)
Monocytes Absolute: 0.7 10*3/uL (ref 0.1–1.0)
Monocytes Absolute: 0.9 10*3/uL (ref 0.1–1.0)
Monocytes Relative: 7 %
Monocytes Relative: 9 %
Neutro Abs: 7.3 10*3/uL (ref 1.7–7.7)
Neutro Abs: 7.4 10*3/uL (ref 1.7–7.7)
Neutrophils Relative %: 79 %
Neutrophils Relative %: 81 %
Platelets: 267 10*3/uL (ref 150–400)
Platelets: 289 10*3/uL (ref 150–400)
RBC: 3.53 MIL/uL — ABNORMAL LOW (ref 3.87–5.11)
RBC: 3.53 MIL/uL — ABNORMAL LOW (ref 3.87–5.11)
RDW: 15 % (ref 11.5–15.5)
RDW: 15.1 % (ref 11.5–15.5)
WBC: 9.2 10*3/uL (ref 4.0–10.5)
WBC: 9.3 10*3/uL (ref 4.0–10.5)
nRBC: 0 % (ref 0.0–0.2)
nRBC: 0 % (ref 0.0–0.2)

## 2020-03-19 LAB — COMPREHENSIVE METABOLIC PANEL
ALT: 19 U/L (ref 0–44)
AST: 37 U/L (ref 15–41)
Albumin: 3.6 g/dL (ref 3.5–5.0)
Alkaline Phosphatase: 55 U/L (ref 38–126)
Anion gap: 8 (ref 5–15)
BUN: 15 mg/dL (ref 6–20)
CO2: 28 mmol/L (ref 22–32)
Calcium: 8.5 mg/dL — ABNORMAL LOW (ref 8.9–10.3)
Chloride: 104 mmol/L (ref 98–111)
Creatinine, Ser: 0.91 mg/dL (ref 0.44–1.00)
GFR, Estimated: >60 mL/min (ref >60)
Glucose, Bld: 135 mg/dL — ABNORMAL HIGH (ref 70–99)
Potassium: 3.8 mmol/L (ref 3.5–5.1)
Sodium: 140 mmol/L (ref 135–145)
Total Bilirubin: 0.9 mg/dL (ref 0.3–1.2)
Total Protein: 6.9 g/dL (ref 6.5–8.1)

## 2020-03-19 LAB — BASIC METABOLIC PANEL
Anion gap: 12 (ref 5–15)
BUN: 15 mg/dL (ref 6–20)
CO2: 25 mmol/L (ref 22–32)
Calcium: 8.4 mg/dL — ABNORMAL LOW (ref 8.9–10.3)
Chloride: 105 mmol/L (ref 98–111)
Creatinine, Ser: 0.9 mg/dL (ref 0.44–1.00)
GFR, Estimated: >60 mL/min (ref >60)
Glucose, Bld: 133 mg/dL — ABNORMAL HIGH (ref 70–99)
Potassium: 4.3 mmol/L (ref 3.5–5.1)
Sodium: 142 mmol/L (ref 135–145)

## 2020-03-19 LAB — URINALYSIS, COMPLETE (UACMP) WITH MICROSCOPIC
Bacteria, UA: NONE SEEN
Bilirubin Urine: NEGATIVE
Glucose, UA: NEGATIVE mg/dL
Hgb urine dipstick: NEGATIVE
Ketones, ur: NEGATIVE mg/dL
Nitrite: NEGATIVE
Protein, ur: NEGATIVE mg/dL
Specific Gravity, Urine: 1.009 (ref 1.005–1.030)
pH: 5 (ref 5.0–8.0)

## 2020-03-19 LAB — ECHOCARDIOGRAM COMPLETE
AR max vel: 2.01 cm2
AV Area VTI: 2.21 cm2
AV Area mean vel: 1.62 cm2
AV Mean grad: 4.5 mmHg
AV Peak grad: 7.3 mmHg
Ao pk vel: 1.36 m/s
Area-P 1/2: 2.52 cm2
Height: 62 in
S' Lateral: 2.05 cm
Weight: 2532.64 oz

## 2020-03-19 LAB — PROCALCITONIN: Procalcitonin: <0.1 ng/mL

## 2020-03-19 LAB — MAGNESIUM: Magnesium: 2.1 mg/dL (ref 1.7–2.4)

## 2020-03-19 LAB — TROPONIN I (HIGH SENSITIVITY)
Troponin I (High Sensitivity): 5 ng/L (ref <18)
Troponin I (High Sensitivity): 6 ng/L (ref <18)

## 2020-03-19 LAB — LIPASE, BLOOD: Lipase: 28 U/L (ref 11–51)

## 2020-03-19 LAB — PHOSPHORUS: Phosphorus: 4.1 mg/dL (ref 2.5–4.6)

## 2020-03-19 LAB — LACTIC ACID, PLASMA: Lactic Acid, Venous: 1.3 mmol/L (ref 0.5–1.9)

## 2020-03-19 LAB — RESPIRATORY PANEL BY RT PCR (FLU A&B, COVID)
Influenza A by PCR: NEGATIVE
Influenza B by PCR: NEGATIVE
SARS Coronavirus 2 by RT PCR: NEGATIVE

## 2020-03-19 MED ORDER — SODIUM CHLORIDE 0.9 % IV BOLUS (SEPSIS): 250.0000 mL | Freq: Once | INTRAVENOUS | Status: DC

## 2020-03-19 MED ORDER — IOHEXOL 350 MG/ML SOLN
75.0000 mL | Freq: Once | INTRAVENOUS | Status: AC | PRN
  Administered 2020-03-19: 75 mL via INTRAVENOUS

## 2020-03-19 MED ORDER — SODIUM CHLORIDE 0.9 % IV SOLN
1.0000 g | INTRAVENOUS | Status: DC
  Administered 2020-03-19 – 2020-03-21 (×3): 1 g via INTRAVENOUS
  Filled 2020-03-19: qty 10
  Filled 2020-03-19: qty 1
  Filled 2020-03-19: qty 10

## 2020-03-19 MED ORDER — SODIUM CHLORIDE 0.9 % IV BOLUS
1000.0000 mL | Freq: Once | INTRAVENOUS | Status: AC
  Administered 2020-03-19: 1000 mL via INTRAVENOUS

## 2020-03-19 MED ORDER — SODIUM CHLORIDE 0.9 % IV SOLN: 250.0000 mL | INTRAVENOUS | Status: DC

## 2020-03-19 MED ORDER — POLYETHYLENE GLYCOL 3350 17 G PO PACK: 17.0000 g | PACK | Freq: Every day | ORAL | Status: DC | PRN

## 2020-03-19 MED ORDER — ALBUTEROL SULFATE HFA 108 (90 BASE) MCG/ACT IN AERS
2.0000 | INHALATION_SPRAY | Freq: Four times a day (QID) | RESPIRATORY_TRACT | Status: DC | PRN
  Filled 2020-03-19 (×2): qty 6.7

## 2020-03-19 MED ORDER — LEVOTHYROXINE SODIUM 125 MCG PO TABS
125.0000 ug | ORAL_TABLET | Freq: Every day | ORAL | Status: DC
  Administered 2020-03-19 – 2020-03-21 (×3): 125 ug via ORAL
  Filled 2020-03-19 (×4): qty 1

## 2020-03-19 MED ORDER — ONDANSETRON HCL 4 MG/2ML IJ SOLN
4.0000 mg | Freq: Four times a day (QID) | INTRAMUSCULAR | Status: DC
  Administered 2020-03-19 – 2020-03-21 (×4): 4 mg via INTRAVENOUS
  Filled 2020-03-19 (×6): qty 2

## 2020-03-19 MED ORDER — CALCIUM GLUCONATE-NACL 1-0.675 GM/50ML-% IV SOLN
1.0000 g | Freq: Once | INTRAVENOUS | Status: AC
  Administered 2020-03-19: 1000 mg via INTRAVENOUS
  Filled 2020-03-19: qty 50

## 2020-03-19 MED ORDER — BUDESONIDE 180 MCG/ACT IN AEPB
2.0000 | INHALATION_SPRAY | Freq: Two times a day (BID) | RESPIRATORY_TRACT | Status: DC
  Administered 2020-03-19 – 2020-03-21 (×5): 2 via RESPIRATORY_TRACT
  Filled 2020-03-19 (×2): qty 1

## 2020-03-19 MED ORDER — ONDANSETRON HCL 4 MG/2ML IJ SOLN
4.0000 mg | Freq: Once | INTRAMUSCULAR | Status: AC
  Administered 2020-03-19: 4 mg via INTRAVENOUS
  Filled 2020-03-19: qty 2

## 2020-03-19 MED ORDER — FENTANYL CITRATE (PF) 100 MCG/2ML IJ SOLN
25.0000 ug | Freq: Once | INTRAMUSCULAR | Status: AC
  Administered 2020-03-19: 25 ug via INTRAVENOUS
  Filled 2020-03-19: qty 2

## 2020-03-19 MED ORDER — IOHEXOL 9 MG/ML PO SOLN: 500.0000 mL | ORAL | Status: AC

## 2020-03-19 MED ORDER — PROCHLORPERAZINE EDISYLATE 10 MG/2ML IJ SOLN
10.0000 mg | Freq: Four times a day (QID) | INTRAMUSCULAR | Status: DC | PRN
  Administered 2020-03-19 (×2): 10 mg via INTRAVENOUS
  Filled 2020-03-19 (×3): qty 2

## 2020-03-19 MED ORDER — MORPHINE SULFATE (PF) 2 MG/ML IV SOLN
2.0000 mg | Freq: Once | INTRAVENOUS | Status: DC
  Filled 2020-03-19: qty 1

## 2020-03-19 MED ORDER — PANTOPRAZOLE SODIUM 40 MG IV SOLR
40.0000 mg | INTRAVENOUS | Status: DC
  Administered 2020-03-19 – 2020-03-21 (×3): 40 mg via INTRAVENOUS
  Filled 2020-03-19 (×3): qty 40

## 2020-03-19 MED ORDER — ENOXAPARIN SODIUM 40 MG/0.4ML ~~LOC~~ SOLN
40.0000 mg | SUBCUTANEOUS | Status: DC
  Administered 2020-03-19 – 2020-03-21 (×3): 40 mg via SUBCUTANEOUS
  Filled 2020-03-19 (×3): qty 0.4

## 2020-03-19 MED ORDER — MONTELUKAST SODIUM 10 MG PO TABS
10.0000 mg | ORAL_TABLET | Freq: Every day | ORAL | Status: DC
  Administered 2020-03-19 – 2020-03-20 (×2): 10 mg via ORAL
  Filled 2020-03-19 (×3): qty 1

## 2020-03-19 MED ORDER — NOREPINEPHRINE 4 MG/250ML-% IV SOLN
0.0000 ug/min | INTRAVENOUS | Status: DC
  Administered 2020-03-19: 2 ug/min via INTRAVENOUS
  Filled 2020-03-19: qty 250

## 2020-03-19 MED ORDER — DOCUSATE SODIUM 100 MG PO CAPS: 100.0000 mg | ORAL_CAPSULE | Freq: Two times a day (BID) | ORAL | Status: DC | PRN

## 2020-03-19 NOTE — Progress Notes (Signed)
CODE SEPSIS - PHARMACY COMMUNICATION  **Broad Spectrum Antibiotics should be administered within 1 hour of Sepsis diagnosis**  Time Code Sepsis Called/Page Received:  10/20 @ 0359  Antibiotics Ordered: Ceftriaxone 1 gm IV X 1   Time of 1st antibiotic administration: 10/20 @ 0425   Additional action taken by pharmacy:   If necessary, Name of Provider/Nurse Contacted:     Alyviah Crandle D ,PharmD Clinical Pharmacist  03/19/2020  4:46 AM

## 2020-03-19 NOTE — Progress Notes (Signed)
*  PRELIMINARY RESULTS* Echocardiogram 2D Echocardiogram has been performed.  Cindy Oconnor 03/19/2020, 2:19 PM

## 2020-03-19 NOTE — ED Notes (Signed)
Placed patient on 2L [place]. O2 Sats were around 90%. Sats now 98-100%

## 2020-03-19 NOTE — Progress Notes (Signed)
Critical Care Follow up Note:  49 year old female with a past medical history significant for Down syndrome, ulcerative colitis, RA on methotrexate, hypothyroidism, asthma, COPD, lupus, GERD and prior DVT who presented to the ED early this morning with complaints chest/neck pain, nausea and vomiting. ED work-up revealed a moderate pericardial effusion and concern for possible UTI. She initially was requiring low-dose Levophed and therefore PCCM was consulted for admission to the ICU. Patient was re-examined this morning in the ED and no longer requiring Levophed. She is resting comfortably in bed with stable vital signs and in no acute distress. Cardiology has been consulted to evaluate the pericardial effusion. Patient is appropriate for stepdown status. Dr. Mortimer Fries has called and spoke with the hospitalist service who will assume care tomorrow morning 03/20/2020 unless the patient's condition were to change in the meantime.    Tonye Royalty ACNP-BC

## 2020-03-19 NOTE — Progress Notes (Signed)
Patient admitted at this tine. Alert and not oriented upon initial assessment. Patient calling out for her mama and went back to sleep. Patient given antinausea medication before arrival stated in report was drowsy. SR on tele verified with tele tech. VSS. BP soft but WNL as to baseline, Asymptomatic. Purewick in place. Bed alarm on. Call bell within reach.

## 2020-03-19 NOTE — ED Notes (Signed)
RN attempted blood draw without success. Lab able to add on some tests to previously drawn blood.

## 2020-03-19 NOTE — Progress Notes (Signed)
Attempted to call mother on both contact numbers listed for completion of admission profile. No answer at this time, primary RN made aware.

## 2020-03-19 NOTE — Consult Note (Signed)
Cardiology Consultation:   Patient ID: Cindy Oconnor MRN: [id number]; DOB: 12-Oct-1970  Admit date: 03/19/2020 Date of Consult: 03/19/2020  Primary Care Provider: Crecencio Mc, MD Primary Cardiologist:[facility], [facility], Dr. Garen Lah rounding Primary Electrophysiologist:  None    Patient Profile:   Cindy Oconnor is a 49 y.o. female with no known past medical history of cardiac disease or arrhythmia and known h/o DVT not on [place], known LBBB, history of Down syndrome, GERD, RA on methotrexate, s/p COVID-19 infection [id number] without complications, COPD, and who is being seen today for the evaluation of pericardial effusion at the request of Dr. Mortimer Fries.  History of Present Illness:   Cindy Oconnor is a 49 year old female with PMH as above and including history of DVT, Down syndrome, GERD, RA, 04/2019 COVID-19 infection, and COPD.    She presents today from a group home ([facility] in [place], [place].    Per patient's mother, family history includes her mother with h/o cardiac arrest and pulmonary stenosis and aunt with history of aortic coarctation and stroke.  She is joined by her mother, who provides the history as outlined below, given the patient has been sedated and is unable to be woken despite multiple attempts this morning. Unfortunately, as the patient is sedated and 2/2 cognition of pt, details as below are limited regarding CP at this time.  On 03/18/2020, the patient's mother reports that she received a call from the group home stating that the patient was reporting chest pain, nausea, and emesis.  When the chest pain persisted, recommendation was that the patient present to the emergency department.  Per EMR, onset of symptoms was approximately 1 hour prior to arrival at [facility] ED.  In the ED, limited history and examination were able to be performed due to the patient's limited cognition.  He was noted that she was tearful and asking for her mother.  In the ED, initial  vitals stable.  Labs showed stable renal function and electrolytes, high-sensitivity troponin 5  6, lactic acid 1.3, RBC 3.53 (Hgb 12.8 & HCT 37.7), respiratory panel negative, UA -WBC without bacteria.  Chest x-ray showed chronic appearing interstitial lung markings without acute disease.  CT abdomen and pelvis showed partially visualized pericardial effusion with recommendation for echo.  CTA without PE and showing moderate pericardial effusion, atelectasis in the lung bases, and fatty liver. Cultures pending. EKG showed ST, 105 bpm, LBBB (not new), significant baseline wander, poor R wave progression precordial leads.  Per patient's mother, prior to sedation, she was still reporting chest pain.  She also had recently been very constipated.  Echo pending.  Heart Pathway Score:     Past Medical History:  Diagnosis Date  . Arthritis   . Asthma    unspecified  . Blood clot in vein   . Chicken pox   . Colitis   . Collagen vascular disease ([place])   . COPD (chronic obstructive pulmonary disease) ([place])   . Deafness in left ear   . Disease of thyroid gland   . Down syndrome   . DVT (deep venous thrombosis) ([place])   . Fractures   . GERD (gastroesophageal reflux disease)   . Hypothyroidism    unspecified  . 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 Positive anti-CCP antibodies, negative rheumatoid factor, positive FANA. b. Methotrexate, Prednisone, Plaquenil.   . Joint pain   . Lupus ([place]) 1995  . Osteoarthritis 01/30/2014   a. Lumbar disc disease. b. Trigger nodules  . Osteoporosis 01/30/2014  a. [place]  . RA (rheumatoid arthritis) ([place])   . Shingles   . Ulcerative (chronic) enterocolitis ([place])   . Ulcerative colitis [facility])     Past Surgical History:  Procedure Laterality Date  . ABDOMINAL HYSTERECTOMY    . ABDOMINAL HYSTERECTOMY W/ PARTIAL [place]  . BUNIONECTOMY Bilateral    Bunionectomy great toe  . COLONOSCOPY  04/16/2013   Hx of ulcerative colitis - repeat 2 years per  Dr. Rayann Heman  . COLONOSCOPY  09/24/2003   Dr. Brita Romp  . FOOT SURGERY Bilateral    x2  . HALLUX VALGUS CORRECTION    . KNEE ARTHROPLASTY Left 01/10/2017   Procedure: COMPUTER ASSISTED TOTAL KNEE ARTHROPLASTY;  Surgeon: Dereck Leep, MD;  Location: [facility] ORS;  Service: Orthopedics;  Laterality: Left;  . KNEE ARTHROPLASTY Right 08/10/2017   Procedure: COMPUTER ASSISTED TOTAL KNEE ARTHROPLASTY;  Surgeon: Dereck Leep, MD;  Location: [facility] ORS;  Service: Orthopedics;  Laterality: Right;     Home Medications:  Prior to Admission medications   Medication Sig Start Date End Date Taking? Authorizing Provider  acetaminophen (TYLENOL) 325 MG tablet Take 2 tablets (650 mg total) by mouth 3 (three) times daily as needed. 04/03/19   Crecencio Mc, MD  albuterol (VENTOLIN HFA) 108 (90 Base) MCG/ACT inhaler Inhale 2 puffs into the lungs every 6 (six) hours as needed for wheezing or shortness of breath. 04/23/19   Merlyn Lot, MD  alendronate (FOSAMAX) 70 MG tablet TAKE 1 TABLET BY MOUTH WEEKLY EARLY MORNING BEFORE FOOD/MEDS WITH WATER DO NOT LIE DOWN FOR 30 MINUTES Patient not taking: Reported on 09/14/2019 12/21/18   Crecencio Mc, MD  amoxicillin-clavulanate (AUGMENTIN) 875-125 MG tablet 1 TABLET 1 HOUR PRIOR TO DENTAL PROCEDURE AND ONE TABLET 12 HOURS LATER Patient not taking: Reported on 09/14/2019 03/02/19   Crecencio Mc, MD  azelastine (OPTIVAR) 0.05 % ophthalmic solution PLACE 1 DROP EACH EYE TWICE DAILY *WAIT 3-5 MINUTES BETWEEN 2 EYE MEDS* *STORE UPRIGHT* 10/02/19   Crecencio Mc, MD  BD ECLIPSE SYRINGE 25G X 1" 3 ML MISC FOR USE WITH CYANOCOBALAMIN 01/25/20   Crecencio Mc, MD  BLUE GEL 2 % GEL APPLY TOPICALLY TO PAINFUL JOINTS 2 TO 3 TIMES DAILY AS NEEDED 01/28/20   Crecencio Mc, MD  budesonide-formoterol (SYMBICORT) 160-4.5 MCG/ACT inhaler INHALE 2 PUFFS ONCE DAILY (1-2 MINUTES BETWEEN PUFFS) 03/26/19   Crecencio Mc, MD  calcium-vitamin D (OSCAL WITH D) 500-200 MG-UNIT TABS  tablet TAKEK 1 TABLET BY MOUTH EVERY DAY FOR SUPPLEMENT 12/21/18   Crecencio Mc, MD  clotrimazole-betamethasone (LOTRISONE) cream APPLY TOPICALLY 2 TIMES PER DAY 01/19/18   Crecencio Mc, MD  COUGH DM 30 MG/5ML liquid GIVE 5ML (30 MG) BY MOUTH FOUR TIMES A DAY AS NEEDED FOR COUGH *SHAKE WELL* *NOTE DOSE* 01/28/20   Crecencio Mc, MD  cyanocobalamin (,VITAMIN B-12,) 1000 MCG/ML injection INJECT 1ML=1000MCG INTRAMUSCULARLY EVERY MONTH (B-12 SUPPLEMENT) 01/25/20   Crecencio Mc, MD  D3-1000 25 MCG (1000 UT) tablet TAKEK 1 TABLET BY MOUTH EVERY DAY FOR SUPPLEMENT 12/21/18   Crecencio Mc, MD  diclofenac Sodium (VOLTAREN) 1 % GEL Apply 2 g topically 4 (four) times daily. As needed for joint pain 09/20/19   Crecencio Mc, MD  docusate sodium (COLACE) 100 MG capsule TAKE 1 CAPSULE BY MOUTH EVERY OTHER DAY 07/16/19   Crecencio Mc, MD  EAR DROPS 6.5 % OTIC solution PLACE 5 DROPS INTO BOTH EARS  TWICE  DAILY  FOR THE EAR *NEED STOP DATE* 01/11/20   Crecencio Mc, MD  folic acid (FOLVITE) 1 MG tablet TAKE 1 TABLET BY MOUTH EVERY DAY 12/21/18   Crecencio Mc, MD  GUAIATUSSIN AC 100-10 MG/5ML syrup TAKE 5MLS BY MOUTH THREE TIMES A DAY AS NEEDED FOR COUGH 01/28/20   Crecencio Mc, MD  ketoconazole (NIZORAL) 2 % cream APPLY TOIPICALLY ONCE DAILY 01/28/20   Crecencio Mc, MD  levothyroxine (SYNTHROID) 125 MCG tablet Take 1 tablet (125 mcg total) by mouth daily before breakfast. 07/24/19   Crecencio Mc, MD  Liniments (BIOFLEXOR) 3 % GEL Apply to painful joints 2 to 3 times daily as needed 10/13/17   Crecencio Mc, MD  methotrexate (RHEUMATREX) 2.5 MG tablet TAKE 7 TABLETS (17.5 MG) BY MOUTH ONCE AWEEK ON FRIDAY. **CAUTION: CHEMOTHERAPY.PROTECT FROM LIGHT** 03/29/18   Crecencio Mc, MD  montelukast (SINGULAIR) 10 MG tablet Take 1 tablet (10 mg total) by mouth at bedtime. 06/14/19   Crecencio Mc, MD  Multiple Vitamins-Minerals (THEREMS-M) TABS TAKE 1 TABLET BY MOUTH EVERY DAY FOR SUPPLEMENT 01/23/19    Crecencio Mc, MD  omeprazole (PRILOSEC) 20 MG capsule TAKE 1 CAPSULE BY MOUTH 2 TIMES PER DAY **DO NOT CRUSH** 06/14/19   Crecencio Mc, MD  OYSCO 500 + D 500-200 MG-UNIT TABS TAKE 1 TABLET BY MOUTH ONCE DAILY FOR SUPPLEMENT 11/20/19   Crecencio Mc, MD  predniSONE (DELTASONE) 2.5 MG tablet TAKE 1 TABLET BY MOUTH EACH DAY 12/21/18   Crecencio Mc, MD  Probiotic CAPS Take 1 capsule by mouth daily. To prevent c dificile colitis Patient not taking: Reported on 09/14/2019 01/26/19   Crecencio Mc, MD  senna (SENOKOT) 8.6 MG TABS tablet TAKE 2 TABLETS(17.2 MG) BY MOUTH AT BEDTIME 01/23/19   Crecencio Mc, MD  traMADol (ULTRAM) 50 MG tablet Take 1 tablet (50 mg total) by mouth daily as needed for moderate pain. 07/17/19   Crecencio Mc, MD    Inpatient Medications: Scheduled Meds: . budesonide  2 puff Inhalation BID  . enoxaparin (LOVENOX) injection  40 mg Subcutaneous Q24H  . levothyroxine  125 mcg Oral Q0600  . montelukast  10 mg Oral QHS  . ondansetron (ZOFRAN) IV  4 mg Intravenous Q6H  . pantoprazole (PROTONIX) IV  40 mg Intravenous Q24H   Continuous Infusions: . sodium chloride    . cefTRIAXone (ROCEPHIN)  IV Stopped (03/19/20 0455)  . norepinephrine (LEVOPHED) Adult infusion Stopped (03/19/20 0651)  . sodium chloride     PRN Meds: albuterol, docusate sodium, polyethylene glycol, prochlorperazine  Allergies:    Allergies  Allergen Reactions  . Erythromycin Shortness Of Breath, Rash and Other (See Comments)    Reaction:  Unknown    . Oxycontin [Oxycodone]   . Tramadol Nausea And Vomiting  . Albuterol Rash and Other (See Comments)    "Jittery"     Social History:   Social History   Socioeconomic History  . Marital status: Single    Spouse name: Not on file  . Number of children: 0  . Years of education: Not on file  . Highest education level: Not on file  Occupational History  . Not on file  Tobacco Use  . Smoking status: Never Smoker  . Smokeless tobacco:  Never Used  Vaping Use  . Vaping Use: Never used  Substance and Sexual Activity  . Alcohol use: No  . Drug use: No  . Sexual activity: Not  on file  Other Topics Concern  . Not on file  Social History Narrative   Lives at [facility] group home.   Social Determinants of Health   Financial Resource Strain:   . Difficulty of Paying Living Expenses: Not on file  Food Insecurity:   . Worried About Charity fundraiser in the Last Year: Not on file  . Ran Out of Food in the Last Year: Not on file  Transportation Needs:   . Lack of Transportation (Medical): Not on file  . Lack of Transportation (Non-Medical): Not on file  Physical Activity:   . Days of Exercise per Week: Not on file  . Minutes of Exercise per Session: Not on file  Stress:   . Feeling of Stress : Not on file  Social Connections:   . Frequency of Communication with Friends and Family: Not on file  . Frequency of Social Gatherings with Friends and Family: Not on file  . Attends Religious Services: Not on file  . Active Member of Clubs or Organizations: Not on file  . Attends Archivist Meetings: Not on file  . Marital Status: Not on file  Intimate Partner Violence:   . Fear of Current or Ex-Partner: Not on file  . Emotionally Abused: Not on file  . Physically Abused: Not on file  . Sexually Abused: Not on file    Family History:   Mother with history of cardiac arrest and pulmonary stenosis, aunt with history of stroke and coarctation of the aorta Family History  Problem Relation Age of Onset  . Heart disease Mother   . Diabetes Mother   . Breast cancer Mother 51  . Heart disease Father   . Diabetes Father   . Alcohol abuse Maternal Grandmother   . Arthritis Maternal Grandmother   . Stroke Maternal Grandmother   . Diabetes Maternal Grandmother   . Alcohol abuse Maternal Grandfather   . Arthritis Maternal Grandfather   . Stroke Maternal Grandfather   . Diabetes Maternal Grandfather       ROS:  Please see the history of present illness.  Review of Systems  Unable to perform ROS: Mental acuity  Cardiovascular: Positive for chest pain.  Gastrointestinal: Positive for constipation, nausea and vomiting.    All other ROS reviewed and negative.     Physical Exam/Data:   Vitals:   03/19/20 0530 03/19/20 0700 03/19/20 0715 03/19/20 0730  BP: (!) 84/74 105/81 107/81 110/85  Pulse: 65 80 80 87  Resp: (!) _0 Temp:      TempSrc:      SpO2: 100% 100% 100% 99%  Weight:      Height:        Intake/Output Summary (Last 24 hours) at 03/19/2020 0815 Last data filed at 03/19/2020 0729 Gross per 24 hour  Intake 1149.02 ml  Output --  Net 1149.02 ml   Last 3 Weights 03/19/2020 09/14/2019 07/05/2019  Weight (lbs) 158 lb 4.6 oz 158 lb 3.2 oz 159 lb  Weight (kg) 71.8 kg 71.759 kg 72.122 kg     Body mass index is 28.95 kg/m.  General:  Well nourished, well developed, in no acute distress.  Sedated and unable to be aroused with multiple attempts/sternal rub. HEENT: Facial features, consistent with Down syndrome; nasal cannula oxygen in place Neck: no JVD Vascular: radial pulses 2+ bilaterally Cardiac:  normal S1, S2; regular rhythm and tachycardic,; no murmur (unable to have patient lean forward to assess  for friction rub) Lungs: Anterior auscultation only due to sedated patient, diffuse bilateral wheezing Abd: soft, nontender, no hepatomegaly  Ext: no edema Musculoskeletal:  No deformities, BUE and BLE strength normal and equal Skin: warm and dry  Neuro: Sedated Psych: Sedated  EKG:  The EKG was personally reviewed and demonstrates:  ST, 105 bpm, LBBB (not new), significant baseline wander, poor R wave progression precordial leads. Telemetry:  Telemetry was personally reviewed and demonstrates: Not on telemetry  Relevant CV Studies: Echo pending  Laboratory Data:  High Sensitivity Troponin:   Recent Labs  Lab 03/19/20 0025 03/19/20 0256  TROPONINIHS 5  6     Cardiac EnzymesNo results for input(s): TROPONINI in the last 168 hours. No results for input(s): TROPIPOC in the last 168 hours.  Chemistry Recent Labs  Lab 03/19/20 0025 03/19/20 0545  NA 140 142  K 3.8 4.3  CL 104 105  CO2 28 25  GLUCOSE 135* 133*  BUN 15 15  CREATININE 0.91 0.90  CALCIUM 8.5* 8.4*  GFRNONAA >60 >60  ANIONGAP 8 12    Recent Labs  Lab 03/19/20 0025  PROT 6.9  ALBUMIN 3.6  AST 37  ALT 19  ALKPHOS 55  BILITOT 0.9   Hematology Recent Labs  Lab 03/19/20 0025  WBC 9.2  RBC 3.53*  HGB 12.8  HCT 37.7  MCV 106.8*  MCH 36.3*  MCHC 34.0  RDW 15.0  PLT 289   BNPNo results for input(s): BNP, PROBNP in the last 168 hours.  DDimer No results for input(s): DDIMER in the last 168 hours.   Radiology/Studies:  CT Abdomen Pelvis Wo Contrast  Result Date: 03/19/2020 CLINICAL DATA:  49 year old female with nausea and vomiting. EXAM: CT ABDOMEN AND PELVIS WITHOUT CONTRAST TECHNIQUE: Multidetector CT imaging of the abdomen and pelvis was performed following the standard protocol without IV contrast. COMPARISON:  CT abdomen pelvis dated 05/17/2016. FINDINGS: Evaluation of this exam is limited in the absence of intravenous contrast as well as due to respiratory motion artifact. Lower chest: Minimal bibasilar atelectasis. The visualized lung bases are otherwise clear. There is pleural lipomatosis. Partially visualized pericardial effusion measuring 8 mm in thickness. Echocardiogram may provide better evaluation if clinically indicated. No intra-abdominal free air or free fluid. Hepatobiliary: Subcentimeter hypodense focus in the left lobe of the liver is not characterized but may represent a cyst. This is similar to prior CT. Apparent ill-defined 13 mm lesion in the posterior right lobe of the liver (segment VII) on image 12/2, likely artifactual and related to motion. MRI may provide better evaluation. No intrahepatic biliary ductal dilatation. The gallbladder is  unremarkable. Pancreas: Unremarkable. No pancreatic ductal dilatation or surrounding inflammatory changes. Spleen: Normal in size without focal abnormality. Adrenals/Urinary Tract: The adrenal glands unremarkable. There is no hydronephrosis or nephrolithiasis on either side. The visualized ureters and urinary bladder appear unremarkable. Stomach/Bowel: There is sigmoid diverticulosis without active inflammatory changes. There is no bowel obstruction or active inflammation. Normal appendix. Vascular/Lymphatic: The abdominal aorta and IVC unremarkable. No portal venous gas. There is no adenopathy. Reproductive: Hysterectomy.  No adnexal masses. Other: None Musculoskeletal: Osteopenia with scoliosis and multilevel degenerative changes of the spine. No acute osseous pathology. IMPRESSION: 1. No acute intra-abdominal or pelvic pathology. 2. Sigmoid diverticulosis. No bowel obstruction. Normal appendix. 3. Partially visualized pericardial effusion. Echocardiogram may provide better evaluation if clinically indicated. Electronically Signed   By: Anner Crete M.D.   On: 03/19/2020 01:59   CT Angio Chest PE W/Cm &/Or Wo Cm  Result Date: 03/19/2020 CLINICAL DATA:  Chest pain for 90 minutes. Neck pain and vomiting. Pulmonary embolus is suspected with high probability. EXAM: CT ANGIOGRAPHY CHEST WITH CONTRAST TECHNIQUE: Multidetector CT imaging of the chest was performed using the standard protocol during bolus administration of intravenous contrast. Multiplanar CT image reconstructions and MIPs were obtained to evaluate the vascular anatomy. CONTRAST:  68m OMNIPAQUE IOHEXOL 350 MG/ML SOLN COMPARISON:  06/27/2013 FINDINGS: Cardiovascular: Motion artifact limits examination. There is good opacification of the central and segmental pulmonary arteries. No focal filling defects. No evidence of significant pulmonary embolus. Normal heart size. Moderate pericardial effusion. Normal caliber thoracic aorta. No aortic  dissection. Great vessel origins are patent. Mediastinum/Nodes: Scattered mediastinal lymph nodes are moderately prominent without pathologic enlargement, probably reactive. Esophagus is decompressed. Lungs/Pleura: Prominent pleural fat in the bases. Atelectasis in the lung bases. No consolidation or airspace disease. No effusions. Airways are patent. Upper Abdomen: Diffuse fatty infiltration of the liver. No acute changes identified in the upper abdomen. Musculoskeletal: No chest wall abnormality. No acute or significant osseous findings. Review of the MIP images confirms the above findings. IMPRESSION: 1. No evidence of significant pulmonary embolus. 2. Moderate pericardial effusion. 3. Atelectasis in the lung bases. 4. Diffuse fatty infiltration of the liver. Electronically Signed   By: WLucienne CapersM.D.   On: 03/19/2020 03:36   DG Chest Port 1 View  Result Date: 03/19/2020 CLINICAL DATA:  Chest pain and shortness of breath. EXAM: PORTABLE CHEST 1 VIEW COMPARISON:  April 23, 2019 FINDINGS: Mild, diffuse, chronic appearing increased interstitial lung markings are seen. There is no evidence of a pleural effusion or pneumothorax. The heart size and mediastinal contours are within normal limits. Multiple chronic right-sided rib fractures are seen. These are present on the prior study. IMPRESSION: Chronic appearing increased interstitial lung markings without evidence of acute or active cardiopulmonary disease. Electronically Signed   By: TVirgina NorfolkM.D.   On: 03/19/2020 00:40    Assessment and Plan:   Pericardial effusion as seen on CT/CTA --As above in HPI, history is limited due to Down syndrome and patient's current sedation.  Per mother, she presents with report of chest pain, nausea, and emesis.  Echo pending after pericardial effusion seen on CT. --Etiology of pericardial effusion unknown at this time and thought likely infectious with blood cultures and urinary culture pending versus  alternative etiology versus noninfectious etiology, including known RA. Agree with current blood and urine cultures. Consider further labs, including CRP/ESR if r/o infectious etiology. --Tn negative x 2. Low suspicion for myopericarditis at this time.  --Echo ordered and will provide further information regarding the size and location of the effusion, determine EF, and rule out acute structural / valvular changes or WMA.   --Treatment plan pending echo. Pericardiocentesis may be needed based on size of effusion or if high suspicion for infection or malignancy +/- bx. Further recommendations regarding medical management, including treatment of pericarditis if ongoing CP. Continue to monitor sx. Repeat EKG due to baseline wander and artifact on initial EKG.  Start telemetry if tolerated by patient.  For questions or updates, please contact CHannibalPlease consult [email or web address] for contact info under     Signed, JArvil Chaco PA-C  03/19/2020 8:15 AM

## 2020-03-19 NOTE — Progress Notes (Signed)
PHARMACY CONSULT NOTE - FOLLOW UP  Pharmacy Consult for Electrolyte Monitoring and Replacement   Recent Labs: Potassium (mmol/L)  Date Value  03/19/2020 3.8  06/28/2013 3.8   Magnesium (mg/dL)  Date Value  06/07/2016 2.4   Calcium (mg/dL)  Date Value  03/19/2020 8.5 (L)   Calcium, Total (mg/dL)  Date Value  06/28/2013 7.6 (L)   Albumin (g/dL)  Date Value  03/19/2020 3.6  06/28/2013 2.2 (L)   Sodium (mmol/L)  Date Value  03/19/2020 140  06/28/2013 142     Assessment: 10/20 @ 0025:   Ca = 8.5,  Alb = 3.6,  Corrected Ca = 8.82   Goal of Therapy:  Electrolytes WNL   Plan:  Will order Calcium gluconate 1 gm IV X 1 and recheck electrolytes with AM labs.   Orene Desanctis ,PharmD Clinical Pharmacist 03/19/2020 4:50 AM

## 2020-03-19 NOTE — ED Provider Notes (Signed)
[facility] Emergency Department Provider Note   ____________________________________________   First MD Initiated Contact with Patient 03/19/20 0019     (approximate)  I have reviewed the triage vital signs and the nursing notes.   HISTORY  Chief Complaint Chest pain  Level V caveat: Limited by limited cognition  HPI Cindy Oconnor is a 49 y.o. female brought to the ED via EMS from [facility] with a chief complaint of chest pain, nausea and vomiting.  Patient with a history of Down syndrome, COPD, history of DVT not currently on anticoagulants, GERD, RA on methotrexate.  Onset of symptoms approximately 1 hour prior to arrival.  Limited history and examination secondary to patient's limited cognition.  Arrives to the ED with emesis on her shirt, tearful and asking for her mother.     Past Medical History:  Diagnosis Date  . Arthritis   . Asthma    unspecified  . Blood clot in vein   . Chicken pox   . Colitis   . Collagen vascular disease ([place])   . COPD (chronic obstructive pulmonary disease) ([place])   . Deafness in left ear   . Disease of thyroid gland   . Down syndrome   . DVT (deep venous thrombosis) ([place])   . Fractures   . GERD (gastroesophageal reflux disease)   . Hypothyroidism    unspecified  . 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 Positive anti-CCP antibodies, negative rheumatoid factor, positive FANA. b. Methotrexate, Prednisone, Plaquenil.   . Joint pain   . Lupus ([place]) 1995  . Osteoarthritis 01/30/2014   a. Lumbar disc disease. b. Trigger nodules  . Osteoporosis 01/30/2014   a. [place]  . RA (rheumatoid arthritis) ([place])   . Shingles   . Ulcerative (chronic) enterocolitis ([place])   . Ulcerative colitis [facility])     Patient Active Problem List   Diagnosis Date Noted  . Pericardial effusion 03/19/2020  . Bilateral hearing loss due to cerumen impaction 09/14/2019  . COVID-19 virus detected 04/30/2019  . Educated about  COVID-19 virus infection 10/19/2018  . Allergic conjunctivitis, bilateral 11/13/2017  . Long-term use of high-risk medication 10/09/2017  . S/P total knee arthroplasty 08/10/2017  . B12 deficiency 06/22/2017  . Primary osteoarthritis of right knee 06/02/2017  . Hypocalcemia 03/15/2017  . Status post total left knee replacement 01/10/2017  . Macrocytosis without anemia 01/08/2017  . Preoperative clearance 01/08/2017  . Moderate persistent asthma with acute exacerbation 09/14/2016  . Ulcerative colitis, chronic ([place]) 09/26/2015  . Chronic inflammatory arthritis 01/30/2014  . Osteoporosis 01/30/2014  . Mild tricuspid regurgitation 02/13/2013  . Routine general medical examination at a health care facility 01/24/2013  . Obesity (BMI 35.0-39.9 without comorbidity) 01/21/2012  . Seronegative rheumatoid arthritis of both hands ([place]) 06/11/2006  . S/P TAH (total abdominal hysterectomy)  and partial vaginectomy 06/11/2006  . Hypothyroid 03/18/2006  . Asthma, chronic 03/18/2006  . GERD 03/18/2006  . Down's syndrome 03/18/2006    Past Surgical History:  Procedure Laterality Date  . ABDOMINAL HYSTERECTOMY    . ABDOMINAL HYSTERECTOMY W/ PARTIAL [place]  . BUNIONECTOMY Bilateral    Bunionectomy great toe  . COLONOSCOPY  04/16/2013   Hx of ulcerative colitis - repeat 2 years per Dr. Rayann Heman  . COLONOSCOPY  09/24/2003   Dr. Brita Romp  . FOOT SURGERY Bilateral    x2  . HALLUX VALGUS CORRECTION    . KNEE ARTHROPLASTY Left 01/10/2017   Procedure: COMPUTER ASSISTED TOTAL KNEE  ARTHROPLASTY;  Surgeon: Dereck Leep, MD;  Location: [facility] ORS;  Service: Orthopedics;  Laterality: Left;  . KNEE ARTHROPLASTY Right 08/10/2017   Procedure: COMPUTER ASSISTED TOTAL KNEE ARTHROPLASTY;  Surgeon: Dereck Leep, MD;  Location: [facility] ORS;  Service: Orthopedics;  Laterality: Right;      Allergies Erythromycin, Oxycontin [oxycodone], Tramadol, and Albuterol  Family History  Problem Relation  Age of Onset  . Heart disease Mother   . Diabetes Mother   . Breast cancer Mother 68  . Heart disease Father   . Diabetes Father   . Alcohol abuse Maternal Grandmother   . Arthritis Maternal Grandmother   . Stroke Maternal Grandmother   . Diabetes Maternal Grandmother   . Alcohol abuse Maternal Grandfather   . Arthritis Maternal Grandfather   . Stroke Maternal Grandfather   . Diabetes Maternal Grandfather     Social History Social History   Tobacco Use  . Smoking status: Never Smoker  . Smokeless tobacco: Never Used  Vaping Use  . Vaping Use: Never used  Substance Use Topics  . Alcohol use: No  . Drug use: No    Review of Systems  Constitutional: No fever/chills Eyes: No visual changes. ENT: No sore throat. Cardiovascular: Positive for chest pain. Respiratory: Denies shortness of breath. Gastrointestinal: No abdominal pain.  Positive for nausea and vomiting.  No diarrhea.  No constipation. Genitourinary: Negative for dysuria. Musculoskeletal: Negative for back pain. Skin: Negative for rash. Neurological: Negative for headaches, focal weakness or numbness.   ____________________________________________   PHYSICAL EXAM:  VITAL SIGNS: ED Triage Vitals  Enc Vitals Group     BP      Pulse      Resp      Temp      Temp src      SpO2      Weight      Height      Head Circumference      Peak Flow      Pain Score      Pain Loc      Pain Edu?      Excl. in [place]?     Constitutional: Alert and oriented.  Tearful appearing and in mild acute distress. Eyes: Conjunctivae are normal. PERRL. EOMI. Head: Atraumatic.  Facies consistent with Down syndrome. Nose: No congestion/rhinnorhea. Mouth/Throat: Mucous membranes are mildly dry.   Neck: No stridor.  No JVD. Cardiovascular: Normal rate, regular rhythm. Grossly normal heart sounds.  Good peripheral circulation. Respiratory: Normal respiratory effort.  No retractions. Lungs CTAB. Gastrointestinal: Soft with mild  diffuse tenderness to palpation without rebound or guarding. No distention. No abdominal bruits. No CVA tenderness. Musculoskeletal: No lower extremity tenderness nor edema.  No joint effusions. Neurologic:  Speech and language consistent with Down's syndrome. No gross focal neurologic deficits are appreciated.  Skin:  Skin is warm, dry and intact. No rash noted. Psychiatric: Mood and affect are normal. Speech and behavior are normal.  ____________________________________________   LABS (all labs ordered are listed, but only abnormal results are displayed)  Labs Reviewed  CBC WITH DIFFERENTIAL/PLATELET - Abnormal; Notable for the following components:      Result Value   RBC 3.53 (*)    MCV 106.8 (*)    MCH 36.3 (*)    All other components within normal limits  COMPREHENSIVE METABOLIC PANEL - Abnormal; Notable for the following components:   Glucose, Bld 135 (*)    Calcium 8.5 (*)    All other components within normal  limits  URINALYSIS, COMPLETE (UACMP) WITH MICROSCOPIC - Abnormal; Notable for the following components:   Color, Urine YELLOW (*)    APPearance HAZY (*)    Leukocytes,Ua MODERATE (*)    All other components within normal limits  RESPIRATORY PANEL BY RT PCR (FLU A&B, COVID)  CULTURE, BLOOD (ROUTINE X 2)  CULTURE, BLOOD (ROUTINE X 2)  URINE CULTURE  LIPASE, BLOOD  LACTIC ACID, PLASMA  LACTIC ACID, PLASMA  HIV ANTIBODY (ROUTINE TESTING W REFLEX)  PROCALCITONIN  CBC WITH DIFFERENTIAL/PLATELET  BASIC METABOLIC PANEL  MAGNESIUM  PHOSPHORUS  PHOSPHORUS  TROPONIN I (HIGH SENSITIVITY)  TROPONIN I (HIGH SENSITIVITY)   ____________________________________________  EKG  ED ECG REPORT I, Donique Hammonds J, the attending physician, personally viewed and interpreted this ECG.   Date: 03/19/2020  EKG Time: 0028  Rate: 105  Rhythm: sinus tachycardia  Axis: Normal  Intervals:left bundle branch block  ST&T Change: Nonspecific No significant change from  04/25/2019  ____________________________________________  RADIOLOGY I, Thaddeaus Monica J, personally viewed and evaluated these images (plain radiographs) as part of my medical decision making, as well as reviewing the written report by the radiologist.  ED MD interpretation: Chest x-ray on my view demonstrates no acute cardiopulmonary process; CT abdomen/pelvis shows no acute intra-abdominal pathology; partially visualized pericardial effusion; CT chest on my view demonstrates no PE, moderate pericardial effusion  Official radiology report(s): CT Abdomen Pelvis Wo Contrast  Result Date: 03/19/2020 CLINICAL DATA:  49 year old female with nausea and vomiting. EXAM: CT ABDOMEN AND PELVIS WITHOUT CONTRAST TECHNIQUE: Multidetector CT imaging of the abdomen and pelvis was performed following the standard protocol without IV contrast. COMPARISON:  CT abdomen pelvis dated 05/17/2016. FINDINGS: Evaluation of this exam is limited in the absence of intravenous contrast as well as due to respiratory motion artifact. Lower chest: Minimal bibasilar atelectasis. The visualized lung bases are otherwise clear. There is pleural lipomatosis. Partially visualized pericardial effusion measuring 8 mm in thickness. Echocardiogram may provide better evaluation if clinically indicated. No intra-abdominal free air or free fluid. Hepatobiliary: Subcentimeter hypodense focus in the left lobe of the liver is not characterized but may represent a cyst. This is similar to prior CT. Apparent ill-defined 13 mm lesion in the posterior right lobe of the liver (segment VII) on image 12/2, likely artifactual and related to motion. MRI may provide better evaluation. No intrahepatic biliary ductal dilatation. The gallbladder is unremarkable. Pancreas: Unremarkable. No pancreatic ductal dilatation or surrounding inflammatory changes. Spleen: Normal in size without focal abnormality. Adrenals/Urinary Tract: The adrenal glands unremarkable. There is  no hydronephrosis or nephrolithiasis on either side. The visualized ureters and urinary bladder appear unremarkable. Stomach/Bowel: There is sigmoid diverticulosis without active inflammatory changes. There is no bowel obstruction or active inflammation. Normal appendix. Vascular/Lymphatic: The abdominal aorta and IVC unremarkable. No portal venous gas. There is no adenopathy. Reproductive: Hysterectomy.  No adnexal masses. Other: None Musculoskeletal: Osteopenia with scoliosis and multilevel degenerative changes of the spine. No acute osseous pathology. IMPRESSION: 1. No acute intra-abdominal or pelvic pathology. 2. Sigmoid diverticulosis. No bowel obstruction. Normal appendix. 3. Partially visualized pericardial effusion. Echocardiogram may provide better evaluation if clinically indicated. Electronically Signed   By: Anner Crete M.D.   On: 03/19/2020 01:59   CT Angio Chest PE W/Cm &/Or Wo Cm  Result Date: 03/19/2020 CLINICAL DATA:  Chest pain for 90 minutes. Neck pain and vomiting. Pulmonary embolus is suspected with high probability. EXAM: CT ANGIOGRAPHY CHEST WITH CONTRAST TECHNIQUE: Multidetector CT imaging of the chest was performed using the  standard protocol during bolus administration of intravenous contrast. Multiplanar CT image reconstructions and MIPs were obtained to evaluate the vascular anatomy. CONTRAST:  17m OMNIPAQUE IOHEXOL 350 MG/ML SOLN COMPARISON:  06/27/2013 FINDINGS: Cardiovascular: Motion artifact limits examination. There is good opacification of the central and segmental pulmonary arteries. No focal filling defects. No evidence of significant pulmonary embolus. Normal heart size. Moderate pericardial effusion. Normal caliber thoracic aorta. No aortic dissection. Great vessel origins are patent. Mediastinum/Nodes: Scattered mediastinal lymph nodes are moderately prominent without pathologic enlargement, probably reactive. Esophagus is decompressed. Lungs/Pleura: Prominent  pleural fat in the bases. Atelectasis in the lung bases. No consolidation or airspace disease. No effusions. Airways are patent. Upper Abdomen: Diffuse fatty infiltration of the liver. No acute changes identified in the upper abdomen. Musculoskeletal: No chest wall abnormality. No acute or significant osseous findings. Review of the MIP images confirms the above findings. IMPRESSION: 1. No evidence of significant pulmonary embolus. 2. Moderate pericardial effusion. 3. Atelectasis in the lung bases. 4. Diffuse fatty infiltration of the liver. Electronically Signed   By: WLucienne CapersM.D.   On: 03/19/2020 03:36   DG Chest Port 1 View  Result Date: 03/19/2020 CLINICAL DATA:  Chest pain and shortness of breath. EXAM: PORTABLE CHEST 1 VIEW COMPARISON:  April 23, 2019 FINDINGS: Mild, diffuse, chronic appearing increased interstitial lung markings are seen. There is no evidence of a pleural effusion or pneumothorax. The heart size and mediastinal contours are within normal limits. Multiple chronic right-sided rib fractures are seen. These are present on the prior study. IMPRESSION: Chronic appearing increased interstitial lung markings without evidence of acute or active cardiopulmonary disease. Electronically Signed   By: TVirgina NorfolkM.D.   On: 03/19/2020 00:40    ____________________________________________   PROCEDURES  Procedure(s) performed (including Critical Care):  .1-3 Lead EKG Interpretation Performed by: SPaulette Blanch MD Authorized by: SPaulette Blanch MD     Interpretation: normal     ECG rate:  92   ECG rate assessment: normal     Rhythm: sinus rhythm     Ectopy: none     Conduction: normal   Comments:     Patient placed on cardiac monitor to evaluate for arrhythmias   CRITICAL CARE Performed by: SPaulette Blanch  Total critical care time: 60 minutes  Critical care time was exclusive of separately billable procedures and treating other patients.  Critical care was  necessary to treat or prevent imminent or life-threatening deterioration.  Critical care was time spent personally by me on the following activities: development of treatment plan with patient and/or surrogate as well as nursing, discussions with consultants, evaluation of patient's response to treatment, examination of patient, obtaining history from patient or surrogate, ordering and performing treatments and interventions, ordering and review of laboratory studies, ordering and review of radiographic studies, pulse oximetry and re-evaluation of patient's condition.   ____________________________________________   INITIAL IMPRESSION / ASSESSMENT AND PLAN / ED COURSE  As part of my medical decision making, I reviewed the following data [place] the [place] obtained from family, Nursing notes reviewed and incorporated, Labs reviewed, EKG interpreted, Old chart reviewed (11/13/2019 rheumatology office visit), Radiograph reviewed and Notes from prior ED visits     49year old female with Down syndrome presenting with chest pain, nausea and vomiting. Differential diagnosis includes, but is not limited to, ACS, aortic dissection, pulmonary embolism, cardiac tamponade, pneumothorax, pneumonia, pericarditis, myocarditis, GI-related causes including esophagitis/gastritis, and musculoskeletal chest wall pain.  Will obtain cardiac work-up including LFTs/lipase.  Initiate IV fluid hydration, IV antiemetic.  Obtain noncontrast CT abdomen/pelvis, chest x-ray and reassess.   Clinical Course as of Mar 20 511  Wed Mar 19, 2020  0208 Updated patient's mother of laboratory and CT abdomen/pelvis result.  Mother tells me patient had normal cardiac work-up at [facility] approximately 10 to 15 years ago.  Patient initially resting but awoke, crying with chest pain.  At times she was hypoxic on room air when she was tearful, gagging and upset; currently on just 1L nasal cannula oxygen with  saturations 95%.  Will obtain CTA chest to evaluate for PE and to further characterize pericardial effusion.  Anticipate hospitalization.   [JS]  0209 Difficulty with obtaining blood pressure due to patient bending arm and subsequent IV slowly.  Patient getting new IV for CTA.  Will administer second liter IV fluids.   [JS]  [id number] CTA demonstrates no PE; moderate pericardial effusion.  Patient sleeping, occasionally winces with chest pain, then falls back asleep.  On minimal nasal cannula oxygen with saturations 95%.  Heart sounds easily auscultated on exam, no JVD.  Do not feel patient warrants emergent pericardiocentesis.   [JS]  [id number] Discussed with CCU NP for admission.  Patient finishing second liter IV fluids; remains mildly hypotensive.  Will start low-dose Levophed.  ED code sepsis activated; IV Rocephin ordered for UTI.  Patient will need echocardiogram inpatient to further evaluate pericardial effusion.   [JS]    Clinical Course User Index [JS] Paulette Blanch, MD     ____________________________________________   FINAL CLINICAL IMPRESSION(S) / ED DIAGNOSES  Final diagnoses:  Chest pain, unspecified type  Nausea and vomiting, intractability of vomiting not specified, unspecified vomiting type  Hypoxia  Sepsis, due to unspecified organism, unspecified whether acute organ dysfunction present [facility])  Urinary tract infection without hematuria, site unspecified  Generalized abdominal pain  Hypotension, unspecified hypotension type  Down's syndrome  Pericardial effusion     ED Discharge Orders    None      *Please note:  Cindy Oconnor was evaluated in Emergency Department on 03/19/2020 for the symptoms described in the history of present illness. She was evaluated in the context of the global COVID-19 pandemic, which necessitated consideration that the patient might be at risk for infection with the SARS-CoV-2 virus that causes COVID-19. Institutional protocols and algorithms  that pertain to the evaluation of patients at risk for COVID-19 are in a state of rapid change based on information released by regulatory bodies including the CDC and federal and state organizations. These policies and algorithms were followed during the patient's care in the ED.  Some ED evaluations and interventions may be delayed as a result of limited staffing during and the pandemic.*   Note:  This document was prepared using Dragon voice recognition software and may include unintentional dictation errors.   Paulette Blanch, MD 03/19/20 [phone]

## 2020-03-19 NOTE — Care Management (Signed)
This is a no charge note  Pick up from PCCM, Dr. Mortimer Fries  49 year old lady with history of Down syndrome, COPD, asthma, ulcerative colitis, lupus, rheumatoid arthritis, GERD, hypothyroidism, DVT, who is admitted to ICU due to sepsis secondary to UTI, on antibiotics.  Patient was initially on vasopressor, currently patient is off vasopressor.  Patient has pericardial effusion.  Cardiology is on board.  Pending 2D echo.  We need to pick up this patient tomorrow morning.      Cindy Costa, MD  [facility]   If 7PM-7AM, please contact night-coverage [email or web address] 03/19/2020, 11:38 AM

## 2020-03-19 NOTE — ED Notes (Signed)
Pt continues gagging and has now had one episode of vomiting with a small amount of emesis noted. Pt on bedside commode with mother at bedside.

## 2020-03-19 NOTE — ED Notes (Signed)
Critical Care NP at bedside 

## 2020-03-19 NOTE — ED Notes (Signed)
Pt sleepy, dry heaves when awake. Compazine prn given.

## 2020-03-19 NOTE — ED Notes (Signed)
Pt vomiting again, NP made aware.

## 2020-03-19 NOTE — ED Notes (Signed)
Xray and mother at bedside.

## 2020-03-19 NOTE — ED Triage Notes (Signed)
Pt to ED via EMS from group home reporting chest pain x 90 minutes with neck pain and vomiting. Pt is dry heaving upon arrival but no emesis at this time. EKG with EMS showed a LBB, unknown if this is new.    Mother of patient reports pt has had similar episodes when constipated.

## 2020-03-19 NOTE — Progress Notes (Signed)
Late entry: 1020 Secure chat with bedside staff about remaining fluid bolus.  Ordered fluid bolus had not been given prior to 6 hour bundle completion. Stated he had not been told about it at shift change.

## 2020-03-19 NOTE — Consult Note (Signed)
PHARMACY CONSULT NOTE - FOLLOW UP  Pharmacy Consult for Electrolyte Monitoring and Replacement   Recent Labs: Potassium (mmol/L)  Date Value  03/19/2020 4.3  06/28/2013 3.8   Magnesium (mg/dL)  Date Value  03/19/2020 2.1   Calcium (mg/dL)  Date Value  03/19/2020 8.4 (L)   Calcium, Total (mg/dL)  Date Value  06/28/2013 7.6 (L)   Albumin (g/dL)  Date Value  03/19/2020 3.6  06/28/2013 2.2 (L)   Phosphorus (mg/dL)  Date Value  03/19/2020 4.1   Sodium (mmol/L)  Date Value  03/19/2020 142  06/28/2013 142    Assessment: 49 yo female presented chest/neck pain, nausea & vomiting.  Pt found to have a moderate pericardial effusion and suspected UTI.  Pharmacy has been consulted to monitor/replenish electrolytes.  Goal of Therapy:  Electrolytes wnl's  Plan:  No replenishment warranted at this time.  Will reassess with am labs.  Lu Duffel ,PharmD Clinical Pharmacist 03/19/2020 8:05 AM

## 2020-03-19 NOTE — ED Notes (Signed)
Pt placed on 2L of oxygen via [place] after episodes of hypoxia while gagging. Pt still has not had any emesis in ED but continues to gag consistently.

## 2020-03-19 NOTE — ED Notes (Signed)
Pt continues to be tearful about pain in chest and continues to report nausea. MD made aware, awaiting orders.

## 2020-03-19 NOTE — H&P (Signed)
NAME:  Cindy Oconnor, MRN:  [id number], DOB:  May 19, 1971, LOS: 0 ADMISSION DATE:  03/19/2020, CONSULTATION DATE:  03/19/2020 REFERRING MD:  Dr. Beather Arbour, CHIEF COMPLAINT:  Chest pain   Brief History   49 yo female presented to the ED with a symptomatic pericardial effusion and UTI becoming hypotensive requiring low dose vasopressors.  History of present illness   49 yo female presented to the ED via EMS from a group home with 90 minutes of chest/neck pain, nausea & vomiting.  ED staff reporting that the patient has been dry heaving since arrival but only a small amount of emesis.  ED workup discovered a moderate pericardial effusion and suspected UTI.  Patient became hypotensive BP 83/50, resuscitated per sepsis protocol, requiring low dose levophed to maintain MAP > 65. PCCM consulted for further management and patient admitted to [facility] for monitoring.  Past Medical History  Downs Syndrome Ulcerative Colitis RA on methotrexate Hypothyroidism Asthma COPD Lupus GERD DVT  Significant Hospital Events   10/20 Admit to Stepdown  Consults:  Cardiology  Procedures:    Significant Diagnostic Tests:  03/19/20 CTa chest > negative for PE, moderate pericardial effusion, atelectasis bilateral lung bases, diffuse fatty infiltration of the liver  Micro Data:  10/20 UC >> 10/20 BC >> 10/20 COVID-19 >> negative 10/20 Influenza A/B >> negative  Antimicrobials:  10/20 Ceftriaxone >>  Interim history/subjective:  Patient dozing comfortably, waking up to verbal stimulation tearful and moaning stating "my chest hurts".  Mother bedside, able to comfort her, then the patient drifted back to sleep.  Labs/ Imaging personally reviewed  Na+/ K+: 140/ 3.8 BUN/Cr.: 15/ 0.91 Hgb: 12.8 Lactic: 1.3 Troponin: 8~ 5 ~ 6  CXR 10/20: mild diffuse chronic appearing increased interstitial lung markings.  Objective   Blood pressure (!) 83/50, pulse 80, temperature 98.7 F (37.1 C), temperature  source Oral, resp. rate 18, height 5' 2"  (1.575 m), weight 71.8 kg, last menstrual period 04/11/1981, SpO2 93 %.        Intake/Output Summary (Last 24 hours) at 03/19/2020 0419 Last data filed at 03/19/2020 0334 Gross per 24 hour  Intake 1000 ml  Output --  Net 1000 ml   Filed Weights   03/19/20 0042  Weight: 71.8 kg    Examination: General: Adult female, chronically ill, lying in bed, NAD HEENT: MM pink/moist, anicteric, atraumatic, neck supple Neuro: drowsy & oriented, folows commands, PERRL +3, MAE CV: s1s2 RRR, NSR w/ LBBB on monitor, no r/m/g Pulm: Regular, non labored on 2 L [place], breath sounds clear-BUL & diminished-BLL GI: soft, rounded, non tender bs x 4 Skin: intact, no rashes/lesions noted Extremities: warm/dry, pulses + 2 R/P, trace edema noted  Resolved Hospital Problem list     Assessment & Plan:  Chest pain secondary to Pericardial Effusion Moderate pericardial effusion seen on CTa. Negative for PE, troponin & EKG negative for ischemic changes. - Cardiology consulted, appreciate input - Echocardiogram ordered - continuous cardiac monitoring - supplemental O2 PRN to maintain SpO2 > 90%  Hypotension in the setting of suspected UTI WBC:9.2, lactic: 1.3 - continue Ceftriaxone, low threshold for discontinuation - trend PCT, monitor WBC/fever curve - f/u cultures - continue levophed drip, to maintain MAP > 65, wean as tolerated  Asthma -home medications restarted: albuterol PRN, montelukast QHS, budesonide BID  Hypothyroidism - restarted home levothyroxine  Best practice:  Diet: NPO Pain/Anxiety/Delirium protocol (if indicated): N/A VAP protocol (if indicated): N/A DVT prophylaxis: enoxaparin GI prophylaxis: protonix  Glucose control: monitor 140-180 Mobility:  bedrest, mobilize as tolerated Code Status: FULL Family Communication: patient and mother updated bedside Disposition: SDU  Labs   CBC: Recent Labs  Lab 03/19/20 0025  WBC 9.2   NEUTROABS 7.3  HGB 12.8  HCT 37.7  MCV 106.8*  PLT 357    Basic Metabolic Panel: Recent Labs  Lab 03/19/20 0025  NA 140  K 3.8  CL 104  CO2 28  GLUCOSE 135*  BUN 15  CREATININE 0.91  CALCIUM 8.5*   GFR: Estimated Creatinine Clearance: 67.9 mL/min (by C-G formula based on SCr of 0.91 mg/dL). Recent Labs  Lab 03/19/20 0025  WBC 9.2    Liver Function Tests: Recent Labs  Lab 03/19/20 0025  AST 37  ALT 19  ALKPHOS 55  BILITOT 0.9  PROT 6.9  ALBUMIN 3.6   Recent Labs  Lab 03/19/20 0025  LIPASE 28   No results for input(s): AMMONIA in the last 168 hours.  ABG No results found for: PHART, PCO2ART, PO2ART, HCO3, TCO2, ACIDBASEDEF, O2SAT   Coagulation Profile: No results for input(s): INR, PROTIME in the last 168 hours.  Cardiac Enzymes: No results for input(s): CKTOTAL, CKMB, CKMBINDEX, TROPONINI in the last 168 hours.  HbA1C: Hemoglobin A1C  Date/Time Value Ref Range Status  06/27/2013 09:04 AM 5.7 4.2 - 6.3 % Final    Comment:    The American Diabetes Association recommends that a primary goal of therapy should be <7% and that physicians should reevaluate the treatment regimen in patients with HbA1c values consistently >8%.    Hgb A1c MFr Bld  Date/Time Value Ref Range Status  08/17/2018 10:23 AM 5.6 4.6 - 6.5 % Final    Comment:    Glycemic Control Guidelines for People with Diabetes:Non Diabetic:  <6%Goal of Therapy: <7%Additional Action Suggested:  >8%   12/28/2017 09:42 AM 5.5 4.6 - 6.5 % Final    Comment:    Glycemic Control Guidelines for People with Diabetes:Non Diabetic:  <6%Goal of Therapy: <7%Additional Action Suggested:  >8%     CBG: No results for input(s): GLUCAP in the last 168 hours.  Review of Systems: positives in BOLD   Gen: Denies fever, chills, weight change, fatigue, night sweats HEENT: Denies blurred vision, double vision, hearing loss, tinnitus, sinus congestion, rhinorrhea, sore throat, neck stiffness, dysphagia PULM:  Denies shortness of breath, cough, sputum production, hemoptysis, wheezing CV: Denies chest pain, edema, orthopnea, paroxysmal nocturnal dyspnea, palpitations GI: Denies abdominal pain, nausea, vomiting, diarrhea, hematochezia, melena, constipation, change in bowel habits GU: Denies dysuria, hematuria, polyuria, oliguria, urethral discharge Endocrine: Denies hot or cold intolerance, polyuria, polyphagia or appetite change Derm: Denies rash, dry skin, scaling or peeling skin change Heme: Denies easy bruising, bleeding, bleeding gums Neuro: Denies headache, numbness, weakness, slurred speech, loss of memory or consciousness  Past Medical History  She,  has a past medical history of Arthritis, Asthma, Blood clot in vein, Chicken pox, Colitis, Collagen vascular disease ([place]), COPD (chronic obstructive pulmonary disease) ([place]), Deafness in left ear, Disease of thyroid gland, Down syndrome, DVT (deep venous thrombosis) ([place]), Fractures, GERD (gastroesophageal reflux disease), Hypothyroidism, Inflammatory arthritis (01/30/2014), Joint pain, Lupus ([place]) (1995), Osteoarthritis (01/30/2014), Osteoporosis (01/30/2014), RA (rheumatoid arthritis) ([place]), Shingles, Ulcerative (chronic) enterocolitis ([place]), and Ulcerative colitis ([place]).   Surgical History    Past Surgical History:  Procedure Laterality Date  . ABDOMINAL HYSTERECTOMY    . ABDOMINAL HYSTERECTOMY W/ PARTIAL [place]  . BUNIONECTOMY Bilateral    Bunionectomy great toe  . COLONOSCOPY  04/16/2013  Hx of ulcerative colitis - repeat 2 years per Dr. Rayann Heman  . COLONOSCOPY  09/24/2003   Dr. Brita Romp  . FOOT SURGERY Bilateral    x2  . HALLUX VALGUS CORRECTION    . KNEE ARTHROPLASTY Left 01/10/2017   Procedure: COMPUTER ASSISTED TOTAL KNEE ARTHROPLASTY;  Surgeon: Dereck Leep, MD;  Location: [facility] ORS;  Service: Orthopedics;  Laterality: Left;  . KNEE ARTHROPLASTY Right 08/10/2017   Procedure: COMPUTER ASSISTED TOTAL KNEE  ARTHROPLASTY;  Surgeon: Dereck Leep, MD;  Location: [facility] ORS;  Service: Orthopedics;  Laterality: Right;     Social History   reports that she has never smoked. She has never used smokeless tobacco. She reports that she does not drink alcohol and does not use drugs.   Family History   Her family history includes Alcohol abuse in her maternal grandfather and maternal grandmother; Arthritis in her maternal grandfather and maternal grandmother; Breast cancer (age of onset: 60) in her mother; Diabetes in her father, maternal grandfather, maternal grandmother, and mother; Heart disease in her father and mother; Stroke in her maternal grandfather and maternal grandmother.   Allergies Allergies  Allergen Reactions  . Erythromycin Shortness Of Breath, Rash and Other (See Comments)    Reaction:  Unknown    . Oxycontin [Oxycodone]   . Tramadol Nausea And Vomiting  . Albuterol Rash and Other (See Comments)    "Jittery"      Home Medications  Prior to Admission medications   Medication Sig Start Date End Date Taking? Authorizing Provider  acetaminophen (TYLENOL) 325 MG tablet Take 2 tablets (650 mg total) by mouth 3 (three) times daily as needed. 04/03/19   Crecencio Mc, MD  albuterol (VENTOLIN HFA) 108 (90 Base) MCG/ACT inhaler Inhale 2 puffs into the lungs every 6 (six) hours as needed for wheezing or shortness of breath. 04/23/19   Merlyn Lot, MD  alendronate (FOSAMAX) 70 MG tablet TAKE 1 TABLET BY MOUTH WEEKLY EARLY MORNING BEFORE FOOD/MEDS WITH WATER DO NOT LIE DOWN FOR 30 MINUTES Patient not taking: Reported on 09/14/2019 12/21/18   Crecencio Mc, MD  amoxicillin-clavulanate (AUGMENTIN) 875-125 MG tablet 1 TABLET 1 HOUR PRIOR TO DENTAL PROCEDURE AND ONE TABLET 12 HOURS LATER Patient not taking: Reported on 09/14/2019 03/02/19   Crecencio Mc, MD  azelastine (OPTIVAR) 0.05 % ophthalmic solution PLACE 1 DROP EACH EYE TWICE DAILY *WAIT 3-5 MINUTES BETWEEN 2 EYE MEDS* *STORE  UPRIGHT* 10/02/19   Crecencio Mc, MD  BD ECLIPSE SYRINGE 25G X 1" 3 ML MISC FOR USE WITH CYANOCOBALAMIN 01/25/20   Crecencio Mc, MD  BLUE GEL 2 % GEL APPLY TOPICALLY TO PAINFUL JOINTS 2 TO 3 TIMES DAILY AS NEEDED 01/28/20   Crecencio Mc, MD  budesonide-formoterol (SYMBICORT) 160-4.5 MCG/ACT inhaler INHALE 2 PUFFS ONCE DAILY (1-2 MINUTES BETWEEN PUFFS) 03/26/19   Crecencio Mc, MD  calcium-vitamin D (OSCAL WITH D) 500-200 MG-UNIT TABS tablet TAKEK 1 TABLET BY MOUTH EVERY DAY FOR SUPPLEMENT 12/21/18   Crecencio Mc, MD  clotrimazole-betamethasone (LOTRISONE) cream APPLY TOPICALLY 2 TIMES PER DAY 01/19/18   Crecencio Mc, MD  COUGH DM 30 MG/5ML liquid GIVE 5ML (30 MG) BY MOUTH FOUR TIMES A DAY AS NEEDED FOR COUGH *SHAKE WELL* *NOTE DOSE* 01/28/20   Crecencio Mc, MD  cyanocobalamin (,VITAMIN B-12,) 1000 MCG/ML injection INJECT 1ML=1000MCG INTRAMUSCULARLY EVERY MONTH (B-12 SUPPLEMENT) 01/25/20   Crecencio Mc, MD  D3-1000 25 MCG (1000 UT) tablet TAKEK 1 TABLET BY  MOUTH EVERY DAY FOR SUPPLEMENT 12/21/18   Crecencio Mc, MD  diclofenac Sodium (VOLTAREN) 1 % GEL Apply 2 g topically 4 (four) times daily. As needed for joint pain 09/20/19   Crecencio Mc, MD  docusate sodium (COLACE) 100 MG capsule TAKE 1 CAPSULE BY MOUTH EVERY OTHER DAY 07/16/19   Crecencio Mc, MD  EAR DROPS 6.5 % OTIC solution PLACE 5 DROPS INTO BOTH EARS  TWICE DAILY  FOR THE EAR *NEED STOP DATE* 01/11/20   Crecencio Mc, MD  folic acid (FOLVITE) 1 MG tablet TAKE 1 TABLET BY MOUTH EVERY DAY 12/21/18   Crecencio Mc, MD  GUAIATUSSIN AC 100-10 MG/5ML syrup TAKE 5MLS BY MOUTH THREE TIMES A DAY AS NEEDED FOR COUGH 01/28/20   Crecencio Mc, MD  ketoconazole (NIZORAL) 2 % cream APPLY TOIPICALLY ONCE DAILY 01/28/20   Crecencio Mc, MD  levothyroxine (SYNTHROID) 125 MCG tablet Take 1 tablet (125 mcg total) by mouth daily before breakfast. 07/24/19   Crecencio Mc, MD  Liniments (BIOFLEXOR) 3 % GEL Apply to painful joints 2 to 3  times daily as needed 10/13/17   Crecencio Mc, MD  methotrexate (RHEUMATREX) 2.5 MG tablet TAKE 7 TABLETS (17.5 MG) BY MOUTH ONCE AWEEK ON FRIDAY. **CAUTION: CHEMOTHERAPY.PROTECT FROM LIGHT** 03/29/18   Crecencio Mc, MD  montelukast (SINGULAIR) 10 MG tablet Take 1 tablet (10 mg total) by mouth at bedtime. 06/14/19   Crecencio Mc, MD  Multiple Vitamins-Minerals (THEREMS-M) TABS TAKE 1 TABLET BY MOUTH EVERY DAY FOR SUPPLEMENT 01/23/19   Crecencio Mc, MD  omeprazole (PRILOSEC) 20 MG capsule TAKE 1 CAPSULE BY MOUTH 2 TIMES PER DAY **DO NOT CRUSH** 06/14/19   Crecencio Mc, MD  OYSCO 500 + D 500-200 MG-UNIT TABS TAKE 1 TABLET BY MOUTH ONCE DAILY FOR SUPPLEMENT 11/20/19   Crecencio Mc, MD  predniSONE (DELTASONE) 2.5 MG tablet TAKE 1 TABLET BY MOUTH EACH DAY 12/21/18   Crecencio Mc, MD  Probiotic CAPS Take 1 capsule by mouth daily. To prevent c dificile colitis Patient not taking: Reported on 09/14/2019 01/26/19   Crecencio Mc, MD  senna (SENOKOT) 8.6 MG TABS tablet TAKE 2 TABLETS(17.2 MG) BY MOUTH AT BEDTIME 01/23/19   Crecencio Mc, MD  traMADol (ULTRAM) 50 MG tablet Take 1 tablet (50 mg total) by mouth daily as needed for moderate pain. 07/17/19   Crecencio Mc, MD     Critical care time:      Domingo Pulse [place], AGACNP-BC [facility]    Please see Amion for pager details.

## 2020-03-20 DIAGNOSIS — Q909 Down syndrome, unspecified: Secondary | ICD-10-CM | POA: Diagnosis not present

## 2020-03-20 DIAGNOSIS — R1084 Generalized abdominal pain: Secondary | ICD-10-CM

## 2020-03-20 DIAGNOSIS — N390 Urinary tract infection, site not specified: Principal | ICD-10-CM

## 2020-03-20 DIAGNOSIS — I959 Hypotension, unspecified: Secondary | ICD-10-CM

## 2020-03-20 DIAGNOSIS — R112 Nausea with vomiting, unspecified: Secondary | ICD-10-CM

## 2020-03-20 DIAGNOSIS — A419 Sepsis, unspecified organism: Secondary | ICD-10-CM

## 2020-03-20 DIAGNOSIS — R6520 Severe sepsis without septic shock: Secondary | ICD-10-CM

## 2020-03-20 DIAGNOSIS — R079 Chest pain, unspecified: Secondary | ICD-10-CM

## 2020-03-20 LAB — BASIC METABOLIC PANEL
Anion gap: 10 (ref 5–15)
BUN: 12 mg/dL (ref 6–20)
CO2: 24 mmol/L (ref 22–32)
Calcium: 7.8 mg/dL — ABNORMAL LOW (ref 8.9–10.3)
Chloride: 103 mmol/L (ref 98–111)
Creatinine, Ser: 0.83 mg/dL (ref 0.44–1.00)
GFR, Estimated: >60 mL/min (ref >60)
Glucose, Bld: 90 mg/dL (ref 70–99)
Potassium: 4 mmol/L (ref 3.5–5.1)
Sodium: 137 mmol/L (ref 135–145)

## 2020-03-20 LAB — MRSA PCR SCREENING: MRSA by PCR: NEGATIVE

## 2020-03-20 LAB — HIV ANTIBODY (ROUTINE TESTING W REFLEX): HIV Screen 4th Generation wRfx: NONREACTIVE

## 2020-03-20 LAB — C-REACTIVE PROTEIN: CRP: 19.5 mg/dL — ABNORMAL HIGH (ref <1.0)

## 2020-03-20 LAB — PROCALCITONIN: Procalcitonin: 0.1 ng/mL

## 2020-03-20 LAB — SEDIMENTATION RATE: Sed Rate: 47 mm/hr — ABNORMAL HIGH (ref 0–30)

## 2020-03-20 MED ORDER — GUAIFENESIN-CODEINE 100-10 MG/5ML PO SYRP: 5.0000 mL | ORAL_SOLUTION | Freq: Four times a day (QID) | ORAL | Status: DC | PRN

## 2020-03-20 MED ORDER — CALCIUM CARBONATE-VITAMIN D 500-200 MG-UNIT PO TABS
1.0000 | ORAL_TABLET | Freq: Every day | ORAL | Status: DC
  Administered 2020-03-21: 1 via ORAL
  Filled 2020-03-20 (×2): qty 1

## 2020-03-20 MED ORDER — DEXTROMETHORPHAN POLISTIREX ER 30 MG/5ML PO SUER: 15.0000 mg | Freq: Three times a day (TID) | ORAL | Status: DC | PRN

## 2020-03-20 MED ORDER — DEXTROMETHORPHAN POLISTIREX ER 30 MG/5ML PO SUER
15.0000 mg | Freq: Three times a day (TID) | ORAL | Status: DC | PRN
  Administered 2020-03-20 – 2020-03-21 (×2): 15 mg via ORAL
  Filled 2020-03-20 (×3): qty 5

## 2020-03-20 MED ORDER — FLUTICASONE FUROATE-VILANTEROL 200-25 MCG/INH IN AEPB
1.0000 | INHALATION_SPRAY | Freq: Every day | RESPIRATORY_TRACT | Status: DC
  Administered 2020-03-20 – 2020-03-21 (×2): 1 via RESPIRATORY_TRACT
  Filled 2020-03-20: qty 28

## 2020-03-20 MED ORDER — ACETAMINOPHEN 500 MG PO TABS
500.0000 mg | ORAL_TABLET | Freq: Four times a day (QID) | ORAL | Status: DC | PRN
  Administered 2020-03-20 – 2020-03-21 (×3): 500 mg via ORAL
  Filled 2020-03-20 (×3): qty 1

## 2020-03-20 MED ORDER — DICLOFENAC SODIUM 1 % EX GEL
2.0000 g | Freq: Four times a day (QID) | CUTANEOUS | Status: DC | PRN
  Filled 2020-03-20: qty 100

## 2020-03-20 MED ORDER — FOLIC ACID 1 MG PO TABS
1.0000 mg | ORAL_TABLET | Freq: Every day | ORAL | Status: DC
  Administered 2020-03-20 – 2020-03-21 (×2): 1 mg via ORAL
  Filled 2020-03-20 (×2): qty 1

## 2020-03-20 MED ORDER — DOCUSATE SODIUM 100 MG PO CAPS
100.0000 mg | ORAL_CAPSULE | Freq: Two times a day (BID) | ORAL | Status: DC
  Administered 2020-03-20 – 2020-03-21 (×2): 100 mg via ORAL
  Filled 2020-03-20 (×2): qty 1

## 2020-03-20 MED ORDER — ALBUTEROL SULFATE HFA 108 (90 BASE) MCG/ACT IN AERS: 2.0000 | INHALATION_SPRAY | Freq: Four times a day (QID) | RESPIRATORY_TRACT | Status: DC | PRN

## 2020-03-20 NOTE — Consult Note (Signed)
PHARMACY CONSULT NOTE - FOLLOW UP  Pharmacy Consult for Electrolyte Monitoring and Replacement   Recent Labs: Potassium (mmol/L)  Date Value  03/20/2020 4.0  06/28/2013 3.8   Magnesium (mg/dL)  Date Value  03/19/2020 2.1   Calcium (mg/dL)  Date Value  03/20/2020 7.8 (L)   Calcium, Total (mg/dL)  Date Value  06/28/2013 7.6 (L)   Albumin (g/dL)  Date Value  03/19/2020 3.6  06/28/2013 2.2 (L)   Phosphorus (mg/dL)  Date Value  03/19/2020 4.1   Sodium (mmol/L)  Date Value  03/20/2020 137  06/28/2013 142    Assessment: 49 yo female with history of Downs Syndrome, UC, RA on methotrexate, hypothyroidism, COPD, asthma, history of DVT presented chest/neck pain, nausea & vomiting.  Pt found to have a pericardial effusion without evidence of cardiac tamponade and suspected UTI.  Pharmacy has been consulted to monitor/replenish electrolytes.  Goal of Therapy:  Electrolytes within normal limits  Plan:  --No replenishment warranted at this time. Stable since presentation --Consult placed while patient under CCM care. Care has now been transferred to [facility]. Will discontinue consult and defer further ordering of labs and electrolyte replacement to provider. Will continue to follow peripherally.   Benita Gutter 03/20/2020 8:08 AM

## 2020-03-20 NOTE — Progress Notes (Signed)
Progress Note  Patient Name: SAMEEHA ROCKEFELLER Date of Encounter: 03/20/2020  Primary Cardiologist: New to [facility] - consult by [place]  Subjective   No chest pain or dyspnea. Nausea improved. Appetite improved.   Inpatient Medications    Scheduled Meds: . budesonide  2 puff Inhalation BID  . enoxaparin (LOVENOX) injection  40 mg Subcutaneous Q24H  . levothyroxine  125 mcg Oral Q0600  . montelukast  10 mg Oral QHS  . ondansetron (ZOFRAN) IV  4 mg Intravenous Q6H  . pantoprazole (PROTONIX) IV  40 mg Intravenous Q24H   Continuous Infusions: . sodium chloride    . cefTRIAXone (ROCEPHIN)  IV 1 g (03/20/20 0400)   PRN Meds: albuterol, docusate sodium, polyethylene glycol, prochlorperazine   Vital Signs    Vitals:   03/19/20 1847 03/19/20 1949 03/20/20 0320 03/20/20 0735  BP: 98/78 107/62 (!) 88/53 98/60  Pulse: 75 82 95 97  Resp: 15 18 19 18   Temp: 98.6 F (37 C) 98.2 F (36.8 C) 98.7 F (37.1 C) 98.8 F (37.1 C)  TempSrc:  Oral Oral Oral  SpO2: 97% 95% 96% 96%  Weight:   74.7 kg   Height:        Intake/Output Summary (Last 24 hours) at 03/20/2020 1011 Last data filed at 03/20/2020 0735 Gross per 24 hour  Intake 240 ml  Output 825 ml  Net -585 ml   Filed Weights   03/19/20 0042 03/20/20 0320  Weight: 71.8 kg 74.7 kg    Telemetry    SR. PVC, BBB - Personally Reviewed  ECG    No new tracings - Personally Reviewed  Physical Exam   GEN: No acute distress.   Neck: No JVD. Cardiac: RRR, no murmurs, rubs, or gallops.  Respiratory: Clear to auscultation bilaterally.  GI: Soft, nontender, non-distended.   MS: No edema; No deformity. Neuro:  Alert and oriented x 3; Nonfocal.  Psych: Normal affect.  Labs    Chemistry Recent Labs  Lab 03/19/20 0025 03/19/20 0545 03/20/20 0515  NA 140 142 137  K 3.8 4.3 4.0  CL 104 105 103  CO2 28 25 24   GLUCOSE 135* 133* 90  BUN 15 15 12   CREATININE 0.91 0.90 0.83  CALCIUM 8.5* 8.4* 7.8*  PROT 6.9   --   --   ALBUMIN 3.6  --   --   AST 37  --   --   ALT 19  --   --   ALKPHOS 55  --   --   BILITOT 0.9  --   --   GFRNONAA >60 >60 >60  ANIONGAP 8 12 10      Hematology Recent Labs  Lab 03/19/20 0025 03/19/20 1030  WBC 9.2 9.3  RBC 3.53* 3.53*  HGB 12.8 12.7  HCT 37.7 38.5  MCV 106.8* 109.1*  MCH 36.3* 36.0*  MCHC 34.0 33.0  RDW 15.0 15.1  PLT 289 267    Cardiac EnzymesNo results for input(s): TROPONINI in the last 168 hours. No results for input(s): TROPIPOC in the last 168 hours.   BNPNo results for input(s): BNP, PROBNP in the last 168 hours.   DDimer No results for input(s): DDIMER in the last 168 hours.   Radiology    CT Abdomen Pelvis Wo Contrast  Result Date: 03/19/2020 IMPRESSION: 1. No acute intra-abdominal or pelvic pathology. 2. Sigmoid diverticulosis. No bowel obstruction. Normal appendix. 3. Partially visualized pericardial effusion. Echocardiogram may provide better evaluation if clinically indicated. Electronically Signed  By: Anner Crete M.D.   On: 03/19/2020 01:59   CT Angio Chest PE W/Cm &/Or Wo Cm  Result Date: 03/19/2020 IMPRESSION: 1. No evidence of significant pulmonary embolus. 2. Moderate pericardial effusion. 3. Atelectasis in the lung bases. 4. Diffuse fatty infiltration of the liver. Electronically Signed   By: Lucienne Capers M.D.   On: 03/19/2020 03:36   DG Chest Port 1 View  Result Date: 03/19/2020 IMPRESSION: Chronic appearing increased interstitial lung markings without evidence of acute or active cardiopulmonary disease. Electronically Signed   By: Virgina Norfolk M.D.   On: 03/19/2020 00:40    Cardiac Studies   2D echo 03/19/2020: 1. Left ventricular ejection fraction, by estimation, is 50 to 55%. The  left ventricle has low normal function. The left ventricle has no regional  wall motion abnormalities. Left ventricular diastolic parameters were  normal.  2. Right ventricular systolic function is normal. The right  ventricular  size is normal.  3. A small pericardial effusion is present. The pericardial effusion is  circumferential. There is no evidence of cardiac tamponade.  4. The mitral valve is normal in structure. No evidence of mitral valve  regurgitation.  5. The aortic valve was not well visualized. Aortic valve regurgitation  is not visualized.   Patient Profile     49 y.o. female with history of Down syndrome, GERD, RA on methotrexate, COVID-19 infection 71/6967 without complications, prior DVT, and COPD who we are seeing of pericardial effusion.   Assessment & Plan    1. Pericardial effusion: -Echo with a small circumferential pericardial effusion without evidence of tamponade  -Likely in the setting of chronic inflammation/RA -No indication for pericardiocentesis at this time -Add Sed rate and CRP, if normal, no further intervention needed at this time, if elevated would consider treating with empiric NSAIDs -HS-Tn negative x 2 -Repeat echo in ~ 1-2 months as an outpatient to reassess effusion   2. UTI with hypotension: -Improving -Off Levophed -Management per primary service   For questions or updates, please contact [place] Please consult [email or web address] for contact info under Cardiology/STEMI.    Signed, Christell Faith, PA-C [place] Pager: [phone] 03/20/2020, 10:11 AM

## 2020-03-20 NOTE — Progress Notes (Signed)
PROGRESS NOTE  Cindy Oconnor RNH:[id number] DOB: 07/27/1970 DOA: 03/19/2020 PCP: Crecencio Mc, MD   LOS: 1 day   Brief narrative: As per HPI,  49 yo female presented to the ED via EMS from a group home with 90 minutes of chest/neck pain, nausea & vomiting.  ED staff reported that the patient has been dry heaving since arrival but only a small amount of emesis.  ED workup discovered a moderate pericardial effusion and suspected UTI.  Patient became hypotensive BP 83/50, resuscitated per sepsis protocol, requiring low dose levophed to maintain MAP > 65. Patient was admitted to ICU service. At this time, she has been stable and transferred out of the ICU.   Assessment/Plan:  Active Problems:   Pericardial effusion   Severe sepsis (HCC)  Chest pain could be secondary to Pericardial Effusion, Still complains of mild chest pain.  Patient does have rheumatoid arthritis and SLE this could be related to pericarditis as well.  2D echocardiogram performed on 03/19/2020 showed LV ejection fraction of 50 to 55% with no regional wall motion abnormality.  There was a small pericardial effusion present.  No evidence of cardiac tamponade.  Patient did have a CT angiogram which was negative for pulmonary embolism.  Troponins were negative.  EKG was negative for ischemic changes.   Hypotension thought to be secondary to UTI.  Requiring Levophed drip.    Off Levophed drip at this time. She did have no leukocytosis and lactate was 1.3.  On Rocephin at this time we will continue to complete the course.  Asthma Compensated at this time.  Continue albuterol PRN, montelukast QHS, budesonide BID  Hypothyroidism Presumed levothyroxine  Down's syndrome Continue supportive care.  History of GERD.  Resume Protonix  History of rheumatoid arthritis.  Continue diclofenac gel prednisone.  Methotrexate seen on the medication list but has not been taking.  DVT prophylaxis: enoxaparin (LOVENOX) injection  40 mg Start: 03/19/20 1000 SCDs Start: 03/19/20 0430   Code Status:  Full code   Family Communication: I tried to reach the patient's mother on the cell phone and home phone number provided but despite multiple attempts was unable to reach her.  Status is: Inpatient  Remains inpatient appropriate because:IV treatments appropriate due to intensity of illness or inability to take PO and Inpatient level of care appropriate due to severity of illness   Dispo: The patient is from: Home              Anticipated d/c is to: Home              Anticipated d/c date is: 2 days, follow cardiology recommendations              Patient currently is not medically stable to d/c.  Consultants:  Cardiology  PCCM  Procedures:  None  Antibiotics:  . Rocephin IV 10/20>  Anti-infectives (From admission, onward)   Start     Dose/Rate Route Frequency Ordered Stop   03/19/20 0400  cefTRIAXone (ROCEPHIN) 1 g in sodium chloride 0.9 % 100 mL IVPB        1 g 200 mL/hr over 30 Minutes Intravenous Every 24 hours 03/19/20 0359       Subjective: Today, patient was seen and examined at bedside.  Patient complains of mild chest pain and nausea.  No vomiting.  Complains of mild abdominal pain as well.  Denies any shortness of breath, fever or chills.  Complains of pain over the hands.  Objective: Vitals:  03/19/20 1949 03/20/20 0320  BP: 107/62 (!) 88/53  Pulse: 82 95  Resp: 18 19  Temp: 98.2 F (36.8 C) 98.7 F (37.1 C)  SpO2: 95% 96%    Intake/Output Summary (Last 24 hours) at 03/20/2020 0736 Last data filed at 03/20/2020 0735 Gross per 24 hour  Intake 240 ml  Output 825 ml  Net -585 ml   Filed Weights   03/19/20 0042 03/20/20 0320  Weight: 71.8 kg 74.7 kg   Body mass index is 30.11 kg/m.   Physical Exam:  GENERAL: Patient is alert awake and communicative, features of Down syndrome. Not in obvious distress.  Overweight, 2 L/min. HENT: No scleral pallor or icterus. Pupils equally  reactive to light. Oral mucosa is moist NECK: is supple, no gross swelling noted. CHEST: Clear to auscultation. No crackles or wheezes.  Diminished breath sounds bilaterally. CVS: S1 and S2 heard, no murmur. Regular rate and rhythm.  ABDOMEN: Soft, non-tender, bowel sounds are present. EXTREMITIES: No edema.  Arthritis over the hand joints. CNS: Cranial nerves are intact. No focal motor deficits. SKIN: warm and dry without rashes.  Data Review: I have personally reviewed the following laboratory data and studies,  CBC: Recent Labs  Lab 03/19/20 0025 03/19/20 1030  WBC 9.2 9.3  NEUTROABS 7.3 7.4  HGB 12.8 12.7  HCT 37.7 38.5  MCV 106.8* 109.1*  PLT 289 149   Basic Metabolic Panel: Recent Labs  Lab 03/19/20 0025 03/19/20 0545 03/20/20 0515  NA 140 142 137  K 3.8 4.3 4.0  CL 104 105 103  CO2 28 25 24   GLUCOSE 135* 133* 90  BUN 15 15 12   CREATININE 0.91 0.90 0.83  CALCIUM 8.5* 8.4* 7.8*  MG  --  2.1  --   PHOS  --  4.1  --    Liver Function Tests: Recent Labs  Lab 03/19/20 0025  AST 37  ALT 19  ALKPHOS 55  BILITOT 0.9  PROT 6.9  ALBUMIN 3.6   Recent Labs  Lab 03/19/20 0025  LIPASE 28   No results for input(s): AMMONIA in the last 168 hours. Cardiac Enzymes: No results for input(s): CKTOTAL, CKMB, CKMBINDEX, TROPONINI in the last 168 hours. BNP (last 3 results) No results for input(s): BNP in the last 8760 hours.  ProBNP (last 3 results) No results for input(s): PROBNP in the last 8760 hours.  CBG: No results for input(s): GLUCAP in the last 168 hours. Recent Results (from the past 240 hour(s))  Respiratory Panel by RT PCR (Flu A&B, Covid) - Nasopharyngeal Swab     Status: None   Collection Time: 03/19/20 12:25 AM   Specimen: Nasopharyngeal Swab  Result Value Ref Range Status   SARS Coronavirus 2 by RT PCR NEGATIVE NEGATIVE Final    Comment: (NOTE) SARS-CoV-2 target nucleic acids are NOT DETECTED.  The SARS-CoV-2 RNA is generally detectable in  upper respiratoy specimens during the acute phase of infection. The lowest concentration of SARS-CoV-2 viral copies this assay can detect is 131 copies/mL. A negative result does not preclude SARS-Cov-2 infection and should not be used as the sole basis for treatment or other patient management decisions. A negative result may occur with  improper specimen collection/handling, submission of specimen other than nasopharyngeal swab, presence of viral mutation(s) within the areas targeted by this assay, and inadequate number of viral copies (<131 copies/mL). A negative result must be combined with clinical observations, patient history, and epidemiological information. The expected result is Negative.  Fact Sheet for  Patients:  PinkCheek.be  Fact Sheet for Healthcare Providers:  GravelBags.it  This test is no t yet approved or cleared by the [place] FDA and  has been authorized for detection and/or diagnosis of SARS-CoV-2 by FDA under an Emergency Use Authorization (EUA). This EUA will remain  in effect (meaning this test can be used) for the duration of the COVID-19 declaration under Section 564(b)(1) of the Act, 21 U.S.C. section 360bbb-3(b)(1), unless the authorization is terminated or revoked sooner.     Influenza A by PCR NEGATIVE NEGATIVE Final   Influenza B by PCR NEGATIVE NEGATIVE Final    Comment: (NOTE) The Xpert Xpress SARS-CoV-2/FLU/RSV assay is intended as an aid in  the diagnosis of influenza from Nasopharyngeal swab specimens and  should not be used as a sole basis for treatment. Nasal washings and  aspirates are unacceptable for Xpert Xpress SARS-CoV-2/FLU/RSV  testing.  Fact Sheet for Patients: PinkCheek.be  Fact Sheet for Healthcare Providers: GravelBags.it  This test is not yet approved or cleared by the [place] FDA and  has been  authorized for detection and/or diagnosis of SARS-CoV-2 by  FDA under an Emergency Use Authorization (EUA). This EUA will remain  in effect (meaning this test can be used) for the duration of the  Covid-19 declaration under Section 564(b)(1) of the Act, 21  U.S.C. section 360bbb-3(b)(1), unless the authorization is  terminated or revoked. Performed at [facility], [address] [place], [place] [address]   Culture, blood (routine x 2)     Status: None (Preliminary result)   Collection Time: 03/19/20  3:59 AM   Specimen: BLOOD  Result Value Ref Range Status   Specimen Description BLOOD RIGHT ANTECUBITAL  Final   Special Requests   Final    BOTTLES DRAWN AEROBIC AND ANAEROBIC Blood Culture adequate volume   Culture   Final    NO GROWTH 1 DAY Performed at [facility], [address] [place], [place] [address]    Report Status PENDING  Incomplete  MRSA PCR Screening     Status: None   Collection Time: 03/20/20  3:30 AM   Specimen: Nasal Mucosa; Nasopharyngeal  Result Value Ref Range Status   MRSA by PCR NEGATIVE NEGATIVE Final    Comment:        The GeneXpert MRSA Assay (FDA approved for NASAL specimens only), is one component of a comprehensive MRSA colonization surveillance program. It is not intended to diagnose MRSA infection nor to guide or monitor treatment for MRSA infections. Performed at [facility], [address] [place], [place] [address]      Studies: CT Abdomen Pelvis Wo Contrast  Result Date: 03/19/2020 CLINICAL DATA:  49 year old female with nausea and vomiting. EXAM: CT ABDOMEN AND PELVIS WITHOUT CONTRAST TECHNIQUE: Multidetector CT imaging of the abdomen and pelvis was performed following the standard protocol without IV contrast. COMPARISON:  CT abdomen pelvis dated 05/17/2016. FINDINGS: Evaluation of this exam is limited in the absence of intravenous contrast as well as due to respiratory motion artifact. Lower chest:  Minimal bibasilar atelectasis. The visualized lung bases are otherwise clear. There is pleural lipomatosis. Partially visualized pericardial effusion measuring 8 mm in thickness. Echocardiogram may provide better evaluation if clinically indicated. No intra-abdominal free air or free fluid. Hepatobiliary: Subcentimeter hypodense focus in the left lobe of the liver is not characterized but may represent a cyst. This is similar to prior CT. Apparent ill-defined 13 mm lesion in the posterior right lobe of the  liver (segment VII) on image 12/2, likely artifactual and related to motion. MRI may provide better evaluation. No intrahepatic biliary ductal dilatation. The gallbladder is unremarkable. Pancreas: Unremarkable. No pancreatic ductal dilatation or surrounding inflammatory changes. Spleen: Normal in size without focal abnormality. Adrenals/Urinary Tract: The adrenal glands unremarkable. There is no hydronephrosis or nephrolithiasis on either side. The visualized ureters and urinary bladder appear unremarkable. Stomach/Bowel: There is sigmoid diverticulosis without active inflammatory changes. There is no bowel obstruction or active inflammation. Normal appendix. Vascular/Lymphatic: The abdominal aorta and IVC unremarkable. No portal venous gas. There is no adenopathy. Reproductive: Hysterectomy.  No adnexal masses. Other: None Musculoskeletal: Osteopenia with scoliosis and multilevel degenerative changes of the spine. No acute osseous pathology. IMPRESSION: 1. No acute intra-abdominal or pelvic pathology. 2. Sigmoid diverticulosis. No bowel obstruction. Normal appendix. 3. Partially visualized pericardial effusion. Echocardiogram may provide better evaluation if clinically indicated. Electronically Signed   By: Anner Crete M.D.   On: 03/19/2020 01:59   CT Angio Chest PE W/Cm &/Or Wo Cm  Result Date: 03/19/2020 CLINICAL DATA:  Chest pain for 90 minutes. Neck pain and vomiting. Pulmonary embolus is suspected  with high probability. EXAM: CT ANGIOGRAPHY CHEST WITH CONTRAST TECHNIQUE: Multidetector CT imaging of the chest was performed using the standard protocol during bolus administration of intravenous contrast. Multiplanar CT image reconstructions and MIPs were obtained to evaluate the vascular anatomy. CONTRAST:  2m OMNIPAQUE IOHEXOL 350 MG/ML SOLN COMPARISON:  06/27/2013 FINDINGS: Cardiovascular: Motion artifact limits examination. There is good opacification of the central and segmental pulmonary arteries. No focal filling defects. No evidence of significant pulmonary embolus. Normal heart size. Moderate pericardial effusion. Normal caliber thoracic aorta. No aortic dissection. Great vessel origins are patent. Mediastinum/Nodes: Scattered mediastinal lymph nodes are moderately prominent without pathologic enlargement, probably reactive. Esophagus is decompressed. Lungs/Pleura: Prominent pleural fat in the bases. Atelectasis in the lung bases. No consolidation or airspace disease. No effusions. Airways are patent. Upper Abdomen: Diffuse fatty infiltration of the liver. No acute changes identified in the upper abdomen. Musculoskeletal: No chest wall abnormality. No acute or significant osseous findings. Review of the MIP images confirms the above findings. IMPRESSION: 1. No evidence of significant pulmonary embolus. 2. Moderate pericardial effusion. 3. Atelectasis in the lung bases. 4. Diffuse fatty infiltration of the liver. Electronically Signed   By: WLucienne CapersM.D.   On: 03/19/2020 03:36   DG Chest Port 1 View  Result Date: 03/19/2020 CLINICAL DATA:  Chest pain and shortness of breath. EXAM: PORTABLE CHEST 1 VIEW COMPARISON:  April 23, 2019 FINDINGS: Mild, diffuse, chronic appearing increased interstitial lung markings are seen. There is no evidence of a pleural effusion or pneumothorax. The heart size and mediastinal contours are within normal limits. Multiple chronic right-sided rib fractures are  seen. These are present on the prior study. IMPRESSION: Chronic appearing increased interstitial lung markings without evidence of acute or active cardiopulmonary disease. Electronically Signed   By: TVirgina NorfolkM.D.   On: 03/19/2020 00:40   ECHOCARDIOGRAM COMPLETE  Result Date: 03/19/2020    ECHOCARDIOGRAM REPORT   Patient Name:   KKENNETH LAXDate of Exam: 03/19/2020 Medical Rec #:  [id number]         Height:       62.0 in Accession #:    [id number]        Weight:       158.3 lb Date of Birth:  309-16-1972         BSA:  1.731 m Patient Age:    15 years           BP:           99/71 mmHg Patient Gender: F                  HR:           75 bpm. Exam Location:  [facility] Procedure: 2D Echo, Cardiac Doppler and Color Doppler Indications:     Pericardial Effusion 423.9  History:         Patient has no prior history of Echocardiogram examinations.                  COPD. DVT.  Sonographer:     Sherrie Sport RDCS (AE) Referring Phys:  [id number] Arvil Chaco Diagnosing Phys: Kate Sable MD  Sonographer Comments: Suboptimal apical window. IMPRESSIONS  1. Left ventricular ejection fraction, by estimation, is 50 to 55%. The left ventricle has low normal function. The left ventricle has no regional wall motion abnormalities. Left ventricular diastolic parameters were normal.  2. Right ventricular systolic function is normal. The right ventricular size is normal.  3. A small pericardial effusion is present. The pericardial effusion is circumferential. There is no evidence of cardiac tamponade.  4. The mitral valve is normal in structure. No evidence of mitral valve regurgitation.  5. The aortic valve was not well visualized. Aortic valve regurgitation is not visualized. FINDINGS  Left Ventricle: Left ventricular ejection fraction, by estimation, is 50 to 55%. The left ventricle has low normal function. The left ventricle has no regional wall motion abnormalities. The left ventricular internal cavity  size was normal in size. There is no left ventricular hypertrophy. Left ventricular diastolic parameters were normal. Right Ventricle: The right ventricular size is normal. No increase in right ventricular wall thickness. Right ventricular systolic function is normal. Left Atrium: Left atrial size was normal in size. Right Atrium: Right atrial size was normal in size. Pericardium: A small pericardial effusion is present. The pericardial effusion is circumferential. There is no evidence of cardiac tamponade. Mitral Valve: The mitral valve is normal in structure. No evidence of mitral valve regurgitation. Tricuspid Valve: The tricuspid valve is grossly normal. Tricuspid valve regurgitation is not demonstrated. Aortic Valve: The aortic valve was not well visualized. Aortic valve regurgitation is not visualized. Aortic valve mean gradient measures 4.5 mmHg. Aortic valve peak gradient measures 7.3 mmHg. Aortic valve area, by VTI measures 2.21 cm. Pulmonic Valve: The pulmonic valve was not well visualized. Pulmonic valve regurgitation is not visualized. Aorta: The aortic root is normal in size and structure. Venous: The inferior vena cava was not well visualized. IAS/Shunts: No atrial level shunt detected by color flow Doppler.  LEFT VENTRICLE PLAX 2D LVIDd:         3.37 cm  Diastology LVIDs:         2.05 cm  LV e' medial:    9.03 cm/s LV PW:         1.13 cm  LV E/e' medial:  10.5 LV IVS:        1.20 cm  LV e' lateral:   7.18 cm/s LVOT diam:     2.00 cm  LV E/e' lateral: 13.2 LV SV:         48 LV SV Index:   28 LVOT Area:     3.14 cm  RIGHT VENTRICLE RV Basal diam:  2.87 cm RV S prime:     11.20  cm/s TAPSE (M-mode): 2.4 cm LEFT ATRIUM             Index       RIGHT ATRIUM           Index LA diam:        3.10 cm 1.79 cm/m  RA Area:     14.70 cm LA Vol (A2C):   37.6 ml 21.72 ml/m RA Volume:   40.40 ml  23.34 ml/m LA Vol (A4C):   13.0 ml 7.51 ml/m LA Biplane Vol: 24.2 ml 13.98 ml/m  AORTIC VALVE                    PULMONIC VALVE AV Area (Vmax):    2.01 cm    PV Vmax:        1.29 m/s AV Area (Vmean):   1.62 cm    PV Peak grad:   6.7 mmHg AV Area (VTI):     2.21 cm    RVOT Peak grad: 8 mmHg AV Vmax:           135.50 cm/s AV Vmean:          96.950 cm/s AV VTI:            0.219 m AV Peak Grad:      7.3 mmHg AV Mean Grad:      4.5 mmHg LVOT Vmax:         86.50 cm/s LVOT Vmean:        49.900 cm/s LVOT VTI:          0.154 m LVOT/AV VTI ratio: 0.70  AORTA Ao Root diam: 2.20 cm MITRAL VALVE               TRICUSPID VALVE MV Area (PHT): 2.52 cm    TR Peak grad:   24.4 mmHg MV Decel Time: 301 msec    TR Vmax:        247.00 cm/s MV E velocity: 95.10 cm/s MV A velocity: 79.10 cm/s  SHUNTS MV E/A ratio:  1.20        Systemic VTI:  0.15 m                            Systemic Diam: 2.00 cm Kate Sable MD Electronically signed by Kate Sable MD Signature Date/Time: 03/19/2020/5:16:40 PM    Final       Flora Lipps, MD  [facility] 03/20/2020

## 2020-03-20 NOTE — Plan of Care (Signed)
Patient tearful and sad stating that she wanted her mom this evening. Mother called and stated that she would be in tomorrow. Patient was better after speaking with her mom. No complaints of nausea or pain at this time.  Problem: Education: Goal: Knowledge of General Education information will improve Description: Including pain rating scale, medication(s)/side effects and non-pharmacologic comfort measures Outcome: Progressing   Problem: Health Behavior/Discharge Planning: Goal: Ability to manage health-related needs will improve Outcome: Progressing   Problem: Clinical Measurements: Goal: Ability to maintain clinical measurements within normal limits will improve Outcome: Progressing Goal: Will remain free from infection Outcome: Progressing Goal: Diagnostic test results will improve Outcome: Progressing Goal: Respiratory complications will improve Outcome: Progressing Goal: Cardiovascular complication will be avoided Outcome: Progressing   Problem: Activity: Goal: Risk for activity intolerance will decrease Outcome: Progressing   Problem: Nutrition: Goal: Adequate nutrition will be maintained Outcome: Progressing   Problem: Coping: Goal: Level of anxiety will decrease Outcome: Progressing   Problem: Elimination: Goal: Will not experience complications related to bowel motility Outcome: Progressing Goal: Will not experience complications related to urinary retention Outcome: Progressing   Problem: Pain Managment: Goal: General experience of comfort will improve Outcome: Progressing   Problem: Safety: Goal: Ability to remain free from injury will improve Outcome: Progressing   Problem: Skin Integrity: Goal: Risk for impaired skin integrity will decrease Outcome: Progressing

## 2020-03-21 ENCOUNTER — Telehealth: Payer: Self-pay | Admitting: Internal Medicine

## 2020-03-21 DIAGNOSIS — J4541 Moderate persistent asthma with (acute) exacerbation: Secondary | ICD-10-CM

## 2020-03-21 DIAGNOSIS — E034 Atrophy of thyroid (acquired): Secondary | ICD-10-CM

## 2020-03-21 DIAGNOSIS — M06042 Rheumatoid arthritis without rheumatoid factor, left hand: Secondary | ICD-10-CM

## 2020-03-21 DIAGNOSIS — M06041 Rheumatoid arthritis without rheumatoid factor, right hand: Secondary | ICD-10-CM

## 2020-03-21 LAB — BASIC METABOLIC PANEL
Anion gap: 8 (ref 5–15)
BUN: 12 mg/dL (ref 6–20)
CO2: 30 mmol/L (ref 22–32)
Calcium: 8 mg/dL — ABNORMAL LOW (ref 8.9–10.3)
Chloride: 102 mmol/L (ref 98–111)
Creatinine, Ser: 0.66 mg/dL (ref 0.44–1.00)
GFR, Estimated: >60 mL/min (ref >60)
Glucose, Bld: 107 mg/dL — ABNORMAL HIGH (ref 70–99)
Potassium: 3.6 mmol/L (ref 3.5–5.1)
Sodium: 140 mmol/L (ref 135–145)

## 2020-03-21 LAB — CBC
HCT: 33.1 % — ABNORMAL LOW (ref 36.0–46.0)
Hemoglobin: 11 g/dL — ABNORMAL LOW (ref 12.0–15.0)
MCH: 36.8 pg — ABNORMAL HIGH (ref 26.0–34.0)
MCHC: 33.2 g/dL (ref 30.0–36.0)
MCV: 110.7 fL — ABNORMAL HIGH (ref 80.0–100.0)
Platelets: 217 10*3/uL (ref 150–400)
RBC: 2.99 MIL/uL — ABNORMAL LOW (ref 3.87–5.11)
RDW: 15.3 % (ref 11.5–15.5)
WBC: 6.9 10*3/uL (ref 4.0–10.5)
nRBC: 0 % (ref 0.0–0.2)

## 2020-03-21 LAB — PROCALCITONIN: Procalcitonin: <0.1 ng/mL

## 2020-03-21 LAB — URINE CULTURE: Culture: 40000 — AB

## 2020-03-21 LAB — MAGNESIUM: Magnesium: 2.2 mg/dL (ref 1.7–2.4)

## 2020-03-21 MED ORDER — LEVALBUTEROL TARTRATE 45 MCG/ACT IN AERO: 1.0000 | INHALATION_SPRAY | RESPIRATORY_TRACT | 2 refills | Status: DC | PRN

## 2020-03-21 MED ORDER — RACEPINEPHRINE HCL 2.25 % IN NEBU
0.5000 mL | INHALATION_SOLUTION | Freq: Once | RESPIRATORY_TRACT | Status: AC
  Administered 2020-03-21: 0.5 mL via RESPIRATORY_TRACT
  Filled 2020-03-21: qty 0.5

## 2020-03-21 MED ORDER — LEVALBUTEROL HCL 0.63 MG/3ML IN NEBU
0.6300 mg | INHALATION_SOLUTION | Freq: Four times a day (QID) | RESPIRATORY_TRACT | Status: DC
  Administered 2020-03-21 (×3): 0.63 mg via RESPIRATORY_TRACT
  Filled 2020-03-21 (×4): qty 3

## 2020-03-21 NOTE — Progress Notes (Signed)
Pt done with neb and sattes she feels much better.  O2 sat now 100%.

## 2020-03-21 NOTE — Discharge Summary (Signed)
Physician Discharge Summary  Cindy Oconnor:[id number] DOB: 05/13/1971 DOA: 03/19/2020  PCP: Crecencio Mc, MD  Admit date: 03/19/2020 Discharge date: 03/21/2020  Admitted From: Home  Discharge disposition: home   Recommendations for Outpatient Follow-Up:   . Follow up with your primary care provider as has been scheduled. . Check CBC, BMP, magnesium in the next visit . Follow-up with your cardiologist as has been scheduled.  Discharge Diagnosis:   Principal Problem:   Pericardial effusion Active Problems:   Hypothyroid   Asthma, chronic   Seronegative rheumatoid arthritis of both hands (HCC)   Down's syndrome   Discharge Condition: Improved.  Diet recommendation:   Regular.  Wound care: None.  Code status: Full.   History of Present Illness:   49 yo female presented to the ED via EMS from a group home with 90 minutes of chest/neck pain, nausea & vomiting.  ED staff reported that the patient has been dry heaving since arrival but only a small amount of emesis.  ED workup discovered a moderate pericardial effusion and suspected UTI.  Patient became hypotensive BP 83/50, resuscitated per sepsis protocol, requiring low dose levophed to maintain MAP > 65. Patient was admitted to ICU service.  Subsequently, patient was stable and was transferred out of ICU. she has been stable and transferred out of the ICU.    Hospital Course:   Following conditions were addressed during hospitalization as listed below,  Chest pain could be secondary to Pericardial Effusion,  CRP and ESR were elevated but patient does have history of rheumatoid arthritis and SLE.  Cardiology was consulted for concerns of pericarditis but since her symptoms improved no specific medications were introduced.  Cardiology recommended outpatient follow-up in the clinic.    2D echocardiogram performed on 03/19/2020 showed LV ejection fraction of 50 to 55% with no regional wall motion abnormality.   There was a small pericardial effusion present.  No evidence of cardiac tamponade.  Patient did have a CT angiogram which was negative for pulmonary embolism.  Troponins were negative.  EKG was negative for ischemic changes.   UTI.  Treated with IV Rocephin.  Completed course.  Urine culture showed 20,000 colonies of E. coli.  Sensitive to Rocephin.  Blood cultures were negative in 2 days  Hypotension  initially thought to be secondary to UTI and sepsis.Marland Kitchen  Resolved.   Initially required Levophed drip.     Sepsis has been ruled out at this time.  No leukocytosis or fever..  Patient completed 3-day course of IV Rocephin.   Asthma with acute hypoxic respiratory failure.  Patient desaturated to 87% on room air.  Patient has qualified for  home oxygen on discharge. Mild wheezing today.    Continue Xopenex PRN, montelukast QHS, steroid inhaler on discharge.  Patient stated that albuterol made her little fidgety and tremulous   Hypothyroidism Continue levothyroxine   Down's syndrome Continue supportive care.   History of GERD.    Continue Protonix   History of rheumatoid arthritis.  Continue diclofenac gel, prednisone.  Methotrexate seen on the medication list but has not been taking.   Disposition.  At this time, patient is stable for disposition home with outpatient PCP and cardiology follow-up.  Medical Consultants:    Cardiology  Procedures:    2D echocardiogram Subjective:   Today, patient was seen and examined at bedside.  Complains of mild wheezing.  Required oxygen supplementation.  Discharge Exam:   Vitals:   03/21/20 1127 03/21/20 1127  BP:  Marland Kitchen)  108/59  Pulse:  78  Resp:  17  Temp:  97.7 F (36.5 C)  SpO2: (!) 86% 90%   Vitals:   03/21/20 0806 03/21/20 0853 03/21/20 1127 03/21/20 1127  BP:  97/66  (!) 108/59  Pulse: 92 97  78  Resp: (!) 24 18  17   Temp:  98.1 F (36.7 C)  97.7 F (36.5 C)  TempSrc:  Oral    SpO2: 93% 90% (!) 86% 90%  Weight:      Height:         General: Alert awake, not in obvious distress on 2 L of nasal cannula oxygen, upper airway wheezing.  Down syndrome facies.  Overweight, HENT: pupils equally reacting to light,  No scleral pallor or icterus noted. Oral mucosa is moist.  Chest: Decreased breath sounds bilateral.  Wheezing noted. CVS: S1 &S2 heard. No murmur.  Regular rate and rhythm. Abdomen: Soft, nontender, nondistended.  Bowel sounds are heard.   Extremities: No cyanosis, clubbing or edema.  Peripheral pulses are palpable.  Arthritis noticed over the hand joints. Psych: Alert, awake and oriented, normal mood CNS:  No cranial nerve deficits.  Power equal in all extremities.   Skin: Warm and dry.  No rashes noted.  The results of significant diagnostics from this hospitalization (including imaging, microbiology, ancillary and laboratory) are listed below for reference.     Diagnostic Studies:   CT Abdomen Pelvis Wo Contrast  Result Date: 03/19/2020 CLINICAL DATA:  49 year old female with nausea and vomiting. EXAM: CT ABDOMEN AND PELVIS WITHOUT CONTRAST TECHNIQUE: Multidetector CT imaging of the abdomen and pelvis was performed following the standard protocol without IV contrast. COMPARISON:  CT abdomen pelvis dated 05/17/2016. FINDINGS: Evaluation of this exam is limited in the absence of intravenous contrast as well as due to respiratory motion artifact. Lower chest: Minimal bibasilar atelectasis. The visualized lung bases are otherwise clear. There is pleural lipomatosis. Partially visualized pericardial effusion measuring 8 mm in thickness. Echocardiogram may provide better evaluation if clinically indicated. No intra-abdominal free air or free fluid. Hepatobiliary: Subcentimeter hypodense focus in the left lobe of the liver is not characterized but may represent a cyst. This is similar to prior CT. Apparent ill-defined 13 mm lesion in the posterior right lobe of the liver (segment VII) on image 12/2, likely artifactual and  related to motion. MRI may provide better evaluation. No intrahepatic biliary ductal dilatation. The gallbladder is unremarkable. Pancreas: Unremarkable. No pancreatic ductal dilatation or surrounding inflammatory changes. Spleen: Normal in size without focal abnormality. Adrenals/Urinary Tract: The adrenal glands unremarkable. There is no hydronephrosis or nephrolithiasis on either side. The visualized ureters and urinary bladder appear unremarkable. Stomach/Bowel: There is sigmoid diverticulosis without active inflammatory changes. There is no bowel obstruction or active inflammation. Normal appendix. Vascular/Lymphatic: The abdominal aorta and IVC unremarkable. No portal venous gas. There is no adenopathy. Reproductive: Hysterectomy.  No adnexal masses. Other: None Musculoskeletal: Osteopenia with scoliosis and multilevel degenerative changes of the spine. No acute osseous pathology. IMPRESSION: 1. No acute intra-abdominal or pelvic pathology. 2. Sigmoid diverticulosis. No bowel obstruction. Normal appendix. 3. Partially visualized pericardial effusion. Echocardiogram may provide better evaluation if clinically indicated. Electronically Signed   By: Anner Crete M.D.   On: 03/19/2020 01:59   CT Angio Chest PE W/Cm &/Or Wo Cm  Result Date: 03/19/2020 CLINICAL DATA:  Chest pain for 90 minutes. Neck pain and vomiting. Pulmonary embolus is suspected with high probability. EXAM: CT ANGIOGRAPHY CHEST WITH CONTRAST TECHNIQUE: Multidetector CT imaging of  the chest was performed using the standard protocol during bolus administration of intravenous contrast. Multiplanar CT image reconstructions and MIPs were obtained to evaluate the vascular anatomy. CONTRAST:  44m OMNIPAQUE IOHEXOL 350 MG/ML SOLN COMPARISON:  06/27/2013 FINDINGS: Cardiovascular: Motion artifact limits examination. There is good opacification of the central and segmental pulmonary arteries. No focal filling defects. No evidence of significant  pulmonary embolus. Normal heart size. Moderate pericardial effusion. Normal caliber thoracic aorta. No aortic dissection. Great vessel origins are patent. Mediastinum/Nodes: Scattered mediastinal lymph nodes are moderately prominent without pathologic enlargement, probably reactive. Esophagus is decompressed. Lungs/Pleura: Prominent pleural fat in the bases. Atelectasis in the lung bases. No consolidation or airspace disease. No effusions. Airways are patent. Upper Abdomen: Diffuse fatty infiltration of the liver. No acute changes identified in the upper abdomen. Musculoskeletal: No chest wall abnormality. No acute or significant osseous findings. Review of the MIP images confirms the above findings. IMPRESSION: 1. No evidence of significant pulmonary embolus. 2. Moderate pericardial effusion. 3. Atelectasis in the lung bases. 4. Diffuse fatty infiltration of the liver. Electronically Signed   By: WLucienne CapersM.D.   On: 03/19/2020 03:36   DG Chest Port 1 View  Result Date: 03/19/2020 CLINICAL DATA:  Chest pain and shortness of breath. EXAM: PORTABLE CHEST 1 VIEW COMPARISON:  April 23, 2019 FINDINGS: Mild, diffuse, chronic appearing increased interstitial lung markings are seen. There is no evidence of a pleural effusion or pneumothorax. The heart size and mediastinal contours are within normal limits. Multiple chronic right-sided rib fractures are seen. These are present on the prior study. IMPRESSION: Chronic appearing increased interstitial lung markings without evidence of acute or active cardiopulmonary disease. Electronically Signed   By: TVirgina NorfolkM.D.   On: 03/19/2020 00:40   ECHOCARDIOGRAM COMPLETE  Result Date: 03/19/2020    ECHOCARDIOGRAM REPORT   Patient Name:   KDARRYL BLUMENSTEINDate of Exam: 03/19/2020 Medical Rec #:  [id number]         Height:       62.0 in Accession #:    [id number]        Weight:       158.3 lb Date of Birth:  310/19/1972         BSA:          1.731 m  Patient Age:    89years           BP:           99/71 mmHg Patient Gender: F                  HR:           75 bpm. Exam Location:  [facility] Procedure: 2D Echo, Cardiac Doppler and Color Doppler Indications:     Pericardial Effusion 423.9  History:         Patient has no prior history of Echocardiogram examinations.                  COPD. DVT.  Sonographer:     JSherrie SportRDCS (AE) Referring Phys:  [id number] ChacoDiagnosing Phys: BKate SableMD  Sonographer Comments: Suboptimal apical window. IMPRESSIONS  1. Left ventricular ejection fraction, by estimation, is 50 to 55%. The left ventricle has low normal function. The left ventricle has no regional wall motion abnormalities. Left ventricular diastolic parameters were normal.  2. Right ventricular systolic function is normal. The right ventricular size is normal.  3. A  small pericardial effusion is present. The pericardial effusion is circumferential. There is no evidence of cardiac tamponade.  4. The mitral valve is normal in structure. No evidence of mitral valve regurgitation.  5. The aortic valve was not well visualized. Aortic valve regurgitation is not visualized. FINDINGS  Left Ventricle: Left ventricular ejection fraction, by estimation, is 50 to 55%. The left ventricle has low normal function. The left ventricle has no regional wall motion abnormalities. The left ventricular internal cavity size was normal in size. There is no left ventricular hypertrophy. Left ventricular diastolic parameters were normal. Right Ventricle: The right ventricular size is normal. No increase in right ventricular wall thickness. Right ventricular systolic function is normal. Left Atrium: Left atrial size was normal in size. Right Atrium: Right atrial size was normal in size. Pericardium: A small pericardial effusion is present. The pericardial effusion is circumferential. There is no evidence of cardiac tamponade. Mitral Valve: The mitral valve is normal in  structure. No evidence of mitral valve regurgitation. Tricuspid Valve: The tricuspid valve is grossly normal. Tricuspid valve regurgitation is not demonstrated. Aortic Valve: The aortic valve was not well visualized. Aortic valve regurgitation is not visualized. Aortic valve mean gradient measures 4.5 mmHg. Aortic valve peak gradient measures 7.3 mmHg. Aortic valve area, by VTI measures 2.21 cm. Pulmonic Valve: The pulmonic valve was not well visualized. Pulmonic valve regurgitation is not visualized. Aorta: The aortic root is normal in size and structure. Venous: The inferior vena cava was not well visualized. IAS/Shunts: No atrial level shunt detected by color flow Doppler.  LEFT VENTRICLE PLAX 2D LVIDd:         3.37 cm  Diastology LVIDs:         2.05 cm  LV e' medial:    9.03 cm/s LV PW:         1.13 cm  LV E/e' medial:  10.5 LV IVS:        1.20 cm  LV e' lateral:   7.18 cm/s LVOT diam:     2.00 cm  LV E/e' lateral: 13.2 LV SV:         48 LV SV Index:   28 LVOT Area:     3.14 cm  RIGHT VENTRICLE RV Basal diam:  2.87 cm RV S prime:     11.20 cm/s TAPSE (M-mode): 2.4 cm LEFT ATRIUM             Index       RIGHT ATRIUM           Index LA diam:        3.10 cm 1.79 cm/m  RA Area:     14.70 cm LA Vol (A2C):   37.6 ml 21.72 ml/m RA Volume:   40.40 ml  23.34 ml/m LA Vol (A4C):   13.0 ml 7.51 ml/m LA Biplane Vol: 24.2 ml 13.98 ml/m  AORTIC VALVE                   PULMONIC VALVE AV Area (Vmax):    2.01 cm    PV Vmax:        1.29 m/s AV Area (Vmean):   1.62 cm    PV Peak grad:   6.7 mmHg AV Area (VTI):     2.21 cm    RVOT Peak grad: 8 mmHg AV Vmax:           135.50 cm/s AV Vmean:          96.950 cm/s AV  VTI:            0.219 m AV Peak Grad:      7.3 mmHg AV Mean Grad:      4.5 mmHg LVOT Vmax:         86.50 cm/s LVOT Vmean:        49.900 cm/s LVOT VTI:          0.154 m LVOT/AV VTI ratio: 0.70  AORTA Ao Root diam: 2.20 cm MITRAL VALVE               TRICUSPID VALVE MV Area (PHT): 2.52 cm    TR Peak grad:   24.4  mmHg MV Decel Time: 301 msec    TR Vmax:        247.00 cm/s MV E velocity: 95.10 cm/s MV A velocity: 79.10 cm/s  SHUNTS MV E/A ratio:  1.20        Systemic VTI:  0.15 m                            Systemic Diam: 2.00 cm Kate Sable MD Electronically signed by Kate Sable MD Signature Date/Time: 03/19/2020/5:16:40 PM    Final      Labs:   Basic Metabolic Panel: Recent Labs  Lab 03/19/20 0025 03/19/20 0025 03/19/20 0545 03/19/20 0545 03/20/20 0515 03/21/20 0608  NA 140  --  142  --  137 140  K 3.8   < > 4.3   < > 4.0 3.6  CL 104  --  105  --  103 102  CO2 28  --  25  --  24 30  GLUCOSE 135*  --  133*  --  90 107*  BUN 15  --  15  --  12 12  CREATININE 0.91  --  0.90  --  0.83 0.66  CALCIUM 8.5*  --  8.4*  --  7.8* 8.0*  MG  --   --  2.1  --   --  2.2  PHOS  --   --  4.1  --   --   --    < > = values in this interval not displayed.   GFR Estimated Creatinine Clearance: 78.7 mL/min (by C-G formula based on SCr of 0.66 mg/dL). Liver Function Tests: Recent Labs  Lab 03/19/20 0025  AST 37  ALT 19  ALKPHOS 55  BILITOT 0.9  PROT 6.9  ALBUMIN 3.6   Recent Labs  Lab 03/19/20 0025  LIPASE 28   No results for input(s): AMMONIA in the last 168 hours. Coagulation profile No results for input(s): INR, PROTIME in the last 168 hours.  CBC: Recent Labs  Lab 03/19/20 0025 03/19/20 1030 03/21/20 0608  WBC 9.2 9.3 6.9  NEUTROABS 7.3 7.4  --   HGB 12.8 12.7 11.0*  HCT 37.7 38.5 33.1*  MCV 106.8* 109.1* 110.7*  PLT 289 267 217   Cardiac Enzymes: No results for input(s): CKTOTAL, CKMB, CKMBINDEX, TROPONINI in the last 168 hours. BNP: Invalid input(s): POCBNP CBG: No results for input(s): GLUCAP in the last 168 hours. D-Dimer No results for input(s): DDIMER in the last 72 hours. Hgb A1c No results for input(s): HGBA1C in the last 72 hours. Lipid Profile No results for input(s): CHOL, HDL, LDLCALC, TRIG, CHOLHDL, LDLDIRECT in the last 72 hours. Thyroid  function studies No results for input(s): TSH, T4TOTAL, T3FREE, THYROIDAB in the last 72 hours.  Invalid input(s): FREET3 Anemia work  up No results for input(s): VITAMINB12, FOLATE, FERRITIN, TIBC, IRON, RETICCTPCT in the last 72 hours. Microbiology Recent Results (from the past 240 hour(s))  Respiratory Panel by RT PCR (Flu A&B, Covid) - Nasopharyngeal Swab     Status: None   Collection Time: 03/19/20 12:25 AM   Specimen: Nasopharyngeal Swab  Result Value Ref Range Status   SARS Coronavirus 2 by RT PCR NEGATIVE NEGATIVE Final    Comment: (NOTE) SARS-CoV-2 target nucleic acids are NOT DETECTED.  The SARS-CoV-2 RNA is generally detectable in upper respiratoy specimens during the acute phase of infection. The lowest concentration of SARS-CoV-2 viral copies this assay can detect is 131 copies/mL. A negative result does not preclude SARS-Cov-2 infection and should not be used as the sole basis for treatment or other patient management decisions. A negative result may occur with  improper specimen collection/handling, submission of specimen other than nasopharyngeal swab, presence of viral mutation(s) within the areas targeted by this assay, and inadequate number of viral copies (<131 copies/mL). A negative result must be combined with clinical observations, patient history, and epidemiological information. The expected result is Negative.  Fact Sheet for Patients:  PinkCheek.be  Fact Sheet for Healthcare Providers:  GravelBags.it  This test is no t yet approved or cleared by the [place] FDA and  has been authorized for detection and/or diagnosis of SARS-CoV-2 by FDA under an Emergency Use Authorization (EUA). This EUA will remain  in effect (meaning this test can be used) for the duration of the COVID-19 declaration under Section 564(b)(1) of the Act, 21 U.S.C. section 360bbb-3(b)(1), unless the authorization is  terminated or revoked sooner.     Influenza A by PCR NEGATIVE NEGATIVE Final   Influenza B by PCR NEGATIVE NEGATIVE Final    Comment: (NOTE) The Xpert Xpress SARS-CoV-2/FLU/RSV assay is intended as an aid in  the diagnosis of influenza from Nasopharyngeal swab specimens and  should not be used as a sole basis for treatment. Nasal washings and  aspirates are unacceptable for Xpert Xpress SARS-CoV-2/FLU/RSV  testing.  Fact Sheet for Patients: PinkCheek.be  Fact Sheet for Healthcare Providers: GravelBags.it  This test is not yet approved or cleared by the [place] FDA and  has been authorized for detection and/or diagnosis of SARS-CoV-2 by  FDA under an Emergency Use Authorization (EUA). This EUA will remain  in effect (meaning this test can be used) for the duration of the  Covid-19 declaration under Section 564(b)(1) of the Act, 21  U.S.C. section 360bbb-3(b)(1), unless the authorization is  terminated or revoked. Performed at [facility], [address], [place], [place] [address]   Urine culture     Status: Abnormal   Collection Time: 03/19/20 12:25 AM   Specimen: Urine, Random  Result Value Ref Range Status   Specimen Description   Final    URINE, RANDOM Performed at [facility], [address] [place], [place] [address]    Special Requests   Final    NONE Performed at [facility], [place], [place] [address]    Culture 40,000 COLONIES/mL ESCHERICHIA COLI (A)  Final   Report Status 03/21/2020 FINAL  Final   Organism ID, Bacteria ESCHERICHIA COLI (A)  Final      Susceptibility   Escherichia coli - MIC*    AMPICILLIN >=32 RESISTANT Resistant     CEFAZOLIN <=4 SENSITIVE Sensitive     CEFTRIAXONE <=0.25 SENSITIVE Sensitive     CIPROFLOXACIN <=0.25 SENSITIVE Sensitive  GENTAMICIN <=1 SENSITIVE Sensitive     IMIPENEM <=0.25 SENSITIVE Sensitive      NITROFURANTOIN <=16 SENSITIVE Sensitive     TRIMETH/SULFA <=20 SENSITIVE Sensitive     AMPICILLIN/SULBACTAM >=32 RESISTANT Resistant     PIP/TAZO <=4 SENSITIVE Sensitive     * 40,000 COLONIES/mL ESCHERICHIA COLI  Culture, blood (routine x 2)     Status: None (Preliminary result)   Collection Time: 03/19/20  3:59 AM   Specimen: BLOOD  Result Value Ref Range Status   Specimen Description BLOOD RIGHT ANTECUBITAL  Final   Special Requests   Final    BOTTLES DRAWN AEROBIC AND ANAEROBIC Blood Culture adequate volume   Culture   Final    NO GROWTH 2 DAYS Performed at [facility], [address] [place], [place] [address]    Report Status PENDING  Incomplete  MRSA PCR Screening     Status: None   Collection Time: 03/20/20  3:30 AM   Specimen: Nasal Mucosa; Nasopharyngeal  Result Value Ref Range Status   MRSA by PCR NEGATIVE NEGATIVE Final    Comment:        The GeneXpert MRSA Assay (FDA approved for NASAL specimens only), is one component of a comprehensive MRSA colonization surveillance program. It is not intended to diagnose MRSA infection nor to guide or monitor treatment for MRSA infections. Performed at [facility], [address] [place], [place] [address]      Discharge Instructions:   Discharge Instructions     Diet - low sodium heart healthy   Complete by: As directed    Discharge instructions   Complete by: As directed    Follow-up with your primary care physician in 1 week.  Seek medical attention for worsening symptoms including chest pain, worsening shortness of breath.  Continue nebulizers at home.  Follow-up with cardiology in 3 to 4 weeks.  Continue to use oxygen as prescribed.  You have been prescribed different inhaler for wheezing.   Increase activity slowly   Complete by: As directed       Allergies as of 03/21/2020       Reactions   Erythromycin Shortness Of Breath, Rash, Other (See Comments)   Reaction:  Unknown     Oxycontin [oxycodone]    Tramadol Nausea And Vomiting   Albuterol Rash, Other (See Comments)   "Jittery"        Medication List     STOP taking these medications    albuterol 108 (90 Base) MCG/ACT inhaler Commonly known as: VENTOLIN HFA       TAKE these medications    acetaminophen 500 MG tablet Commonly known as: TYLENOL Take 500 mg by mouth every 6 (six) hours as needed for pain.   azelastine 0.05 % ophthalmic solution Commonly known as: OPTIVAR PLACE 1 DROP EACH EYE TWICE DAILY WAIT 3-5 MINUTES BETWEEN 2 EYE MEDS* *STORE UPRIGHT*   BD Eclipse Syringe 25G X 1" 3 ML Misc Generic drug: SYRINGE-NEEDLE (DISP) 3 ML FOR USE WITH CYANOCOBALAMIN   Bioflexor 3 % Gel Apply to painful joints 2 to 3 times daily as needed   Blue Gel 2 % Gel Generic drug: Menthol (Topical Analgesic) APPLY TOPICALLY TO PAINFUL JOINTS 2 TO 3 TIMES DAILY AS NEEDED   budesonide-formoterol 160-4.5 MCG/ACT inhaler Commonly known as: Symbicort INHALE 2 PUFFS ONCE DAILY (1-2 MINUTES BETWEEN PUFFS)   calcium-vitamin D 500-200 MG-UNIT Tabs tablet Commonly known as: OSCAL WITH D TAKEK 1 TABLET BY MOUTH EVERY DAY FOR SUPPLEMENT  clotrimazole-betamethasone cream Commonly known as: LOTRISONE APPLY TOPICALLY 2 TIMES PER DAY   Cough DM 30 MG/5ML liquid Generic drug: dextromethorphan GIVE 5ML (30 MG) BY MOUTH FOUR TIMES A DAY AS NEEDED FOR COUGH *SHAKE WELL* *NOTE DOSE*   D3-1000 25 MCG (1000 UT) tablet Generic drug: Cholecalciferol TAKEK 1 TABLET BY MOUTH EVERY DAY FOR SUPPLEMENT   diclofenac Sodium 1 % Gel Commonly known as: Voltaren Apply 2 g topically 4 (four) times daily. As needed for joint pain   docusate sodium 100 MG capsule Commonly known as: COLACE TAKE 1 CAPSULE BY MOUTH EVERY OTHER DAY   Ear Drops 6.5 % OTIC solution Generic drug: carbamide peroxide PLACE 5 DROPS INTO BOTH EARS  TWICE DAILY    folic acid 1 MG tablet Commonly known as: FOLVITE TAKE 1 TABLET BY MOUTH EVERY  DAY   Guaiatussin AC 100-10 MG/5ML syrup Generic drug: guaiFENesin-codeine TAKE 5MLS BY MOUTH THREE TIMES A DAY AS NEEDED FOR COUGH   ketoconazole 2 % cream Commonly known as: NIZORAL APPLY TOIPICALLY ONCE DAILY   levalbuterol 45 MCG/ACT inhaler Commonly known as: XOPENEX HFA Inhale 1 puff into the lungs every 4 (four) hours as needed for wheezing.   levothyroxine 125 MCG tablet Commonly known as: SYNTHROID Take 1 tablet (125 mcg total) by mouth daily before breakfast.   methotrexate 2.5 MG tablet Commonly known as: RHEUMATREX TAKE 7 TABLETS (17.5 MG) BY MOUTH ONCE AWEEK ON FRIDAY. **CAUTION: CHEMOTHERAPY.PROTECT FROM LIGHT**   montelukast 10 MG tablet Commonly known as: SINGULAIR Take 1 tablet (10 mg total) by mouth at bedtime.   omeprazole 20 MG capsule Commonly known as: PRILOSEC TAKE 1 CAPSULE BY MOUTH 2 TIMES PER DAY **DO NOT CRUSH**   predniSONE 2.5 MG tablet Commonly known as: DELTASONE TAKE 1 TABLET BY MOUTH EACH DAY   Probiotic Caps Take 1 capsule by mouth daily. To prevent c dificile colitis   senna 8.6 MG Tabs tablet Commonly known as: SENOKOT TAKE 2 TABLETS(17.2 MG) BY MOUTH AT BEDTIME   Therems-M Tabs TAKE 1 TABLET BY MOUTH EVERY DAY FOR SUPPLEMENT   traMADol 50 MG tablet Commonly known as: ULTRAM Take 1 tablet (50 mg total) by mouth daily as needed for moderate pain.               Durable Medical Equipment  (From admission, onward)           Start     Ordered   03/21/20 1303  For home use only DME oxygen  Once       Question Answer Comment  Length of Need 6 Months   Mode or (Route) Nasal cannula   Liters per Minute 2   Frequency Continuous (stationary and portable oxygen unit needed)   Oxygen conserving device Yes   Oxygen delivery system Gas      03/21/20 1303            Follow-up Information     Crecencio Mc, MD. Schedule an appointment as soon as possible for a visit in 1 week(s).   Specialty: Internal  Medicine Why: regular followup Contact information: [place] [address] [phone]         Kate Sable, MD. Schedule an appointment as soon as possible for a visit in 2 day(s).   Specialties: Cardiology, Radiology Why: Cardiology follow-up for suspected pericarditis Contact information: [place] [address] [phone]  Time coordinating discharge: 39 minutes  Signed:  Sebron Mcmahill  [facility] 03/21/2020, 2:24 PM

## 2020-03-21 NOTE — Progress Notes (Signed)
At 0220, pt called out for help, stated she "can't breathe".  VVs's taken and lungs auscultated.  Pt having some wheezing.  O2 sat 91% on 2 liters.  RT called in to assess as pt has no PRN's for nebs at this time.  RT got an order for xopenex neb and pt now getting that neb.  Will continue to monitor.

## 2020-03-21 NOTE — NC FL2 (Signed)
[facility] LEVEL OF CARE SCREENING TOOL     IDENTIFICATION  Patient Name: Cindy Oconnor Birthdate: 1970/08/13 Sex: female Admission Date (Current Location): 03/19/2020  [facility] and [place] Number:  Engineering geologist and Address:  [facility], [address] [place],  [address]      Provider Number: [id number]  Attending Physician Name and Address:  Flora Lipps, MD  Relative Name and Phone Number:  [name] @ [phone]    Current Level of Care: Hospital Recommended Level of Care: [facility] Prior Approval Number:    Date Approved/Denied:   PASRR Number:    Discharge Plan: Other (Comment)    Current Diagnoses: Patient Active Problem List   Diagnosis Date Noted   Pericardial effusion 03/19/2020   Severe sepsis ([place]) 03/19/2020   Bilateral hearing loss due to cerumen impaction 09/14/2019   COVID-19 virus detected 04/30/2019   Educated about COVID-19 virus infection 10/19/2018   Allergic conjunctivitis, bilateral 11/13/2017   Long-term use of high-risk medication 10/09/2017   S/P total knee arthroplasty 08/10/2017   B12 deficiency 06/22/2017   Primary osteoarthritis of right knee 06/02/2017   Hypocalcemia 03/15/2017   Status post total left knee replacement 01/10/2017   Macrocytosis without anemia 01/08/2017   Preoperative clearance 01/08/2017   Moderate persistent asthma with acute exacerbation 09/14/2016   Ulcerative colitis, chronic ([place]) 09/26/2015   Chronic inflammatory arthritis 01/30/2014   Osteoporosis 01/30/2014   Mild tricuspid regurgitation 02/13/2013   Routine general medical examination at a health care facility 01/24/2013   Obesity (BMI 35.0-39.9 without comorbidity) 01/21/2012   Seronegative rheumatoid arthritis of both hands ([place]) 06/11/2006   S/P TAH (total abdominal hysterectomy)  and partial vaginectomy 06/11/2006   Hypothyroid 03/18/2006   Asthma, chronic  03/18/2006   GERD 03/18/2006   Down's syndrome 03/18/2006    Orientation RESPIRATION BLADDER Height & Weight     Self, Time, Situation, Place  O2 (O2@ 2liters continous) Continent Weight: 74.7 kg Height:  5' 2"  (157.5 cm)  BEHAVIORAL SYMPTOMS/MOOD NEUROLOGICAL BOWEL NUTRITION STATUS      Continent Diet  AMBULATORY STATUS COMMUNICATION OF NEEDS Skin   Supervision Verbally Normal                       Personal Care Assistance Level of Assistance              Functional Limitations Info             SPECIAL CARE FACTORS FREQUENCY                       Contractures      Additional Factors Info                  Current Medications (03/21/2020):  This is the current hospital active medication list Current Facility-Administered Medications  Medication Dose Route Frequency Provider Last Rate Last Admin   0.9 %  sodium chloride infusion  250 mL Intravenous Continuous Rust-Chester, Toribio Harbour L, NP       acetaminophen (TYLENOL) tablet 500 mg  500 mg Oral Q6H PRN Pokhrel, Laxman, MD   500 mg at 03/21/20 1218   budesonide (PULMICORT) 180 MCG/ACT inhaler 2 puff  2 puff Inhalation BID Rust-Chester, Britton L, NP   2 puff at 03/21/20 1005   calcium-vitamin D (OSCAL WITH D) 500-200 MG-UNIT per tablet 1 tablet  1 tablet Oral Daily Pokhrel, Laxman, MD   1  tablet at 03/21/20 1007   dextromethorphan (DELSYM) 30 MG/5ML liquid 15 mg  15 mg Oral TID PRN Pokhrel, Corrie Mckusick, MD   15 mg at 03/21/20 1332   diclofenac Sodium (VOLTAREN) 1 % topical gel 2 g  2 g Topical QID PRN Pokhrel, Laxman, MD       docusate sodium (COLACE) capsule 100 mg  100 mg Oral BID PRN Rust-Chester, Toribio Harbour L, NP       docusate sodium (COLACE) capsule 100 mg  100 mg Oral BID Pokhrel, Laxman, MD   100 mg at 03/21/20 1007   enoxaparin (LOVENOX) injection 40 mg  40 mg Subcutaneous Q24H Rust-Chester, Britton L, NP   40 mg at 03/21/20 1007   fluticasone furoate-vilanterol (BREO ELLIPTA) 200-25 MCG/INH  1 puff  1 puff Inhalation Daily Pokhrel, Laxman, MD   1 puff at 99/37/16 9678   folic acid (FOLVITE) tablet 1 mg  1 mg Oral Daily Pokhrel, Laxman, MD   1 mg at 03/21/20 1007   levalbuterol (XOPENEX) nebulizer solution 0.63 mg  0.63 mg Nebulization Q6H Sharion Settler, NP   0.63 mg at 03/21/20 1424   levothyroxine (SYNTHROID) tablet 125 mcg  125 mcg Oral Q0600 Rust-Chester, Huel Cote, NP   125 mcg at 03/21/20 0545   montelukast (SINGULAIR) tablet 10 mg  10 mg Oral QHS Rust-Chester, Britton L, NP   10 mg at 03/20/20 2159   ondansetron (ZOFRAN) injection 4 mg  4 mg Intravenous Q6H Rust-Chester, Britton L, NP   4 mg at 03/21/20 1216   pantoprazole (PROTONIX) injection 40 mg  40 mg Intravenous Q24H Rust-Chester, Britton L, NP   40 mg at 03/21/20 0416   polyethylene glycol (MIRALAX / GLYCOLAX) packet 17 g  17 g Oral Daily PRN Rust-Chester, Toribio Harbour L, NP       prochlorperazine (COMPAZINE) injection 10 mg  10 mg Intravenous Q6H PRN Rust-Chester, Britton L, NP   10 mg at 03/19/20 1616     Discharge Medications: Please see discharge summary for a list of discharge medications.  Relevant Imaging Results:  Relevant Lab Results:   Additional Information    Anselm Pancoast, RN

## 2020-03-21 NOTE — Care Management Important Message (Signed)
Important Message  Patient Details  Name: NINETTA ADELSTEIN MRN: [id number] Date of Birth: [birth year]   Medicare Important Message Given:  Yes     Dannette Barbara 03/21/2020, 12:18 PM

## 2020-03-21 NOTE — Telephone Encounter (Signed)
[facility] called to schedule a HFU  Pt is being discharged today Pt has been scheduled for 10/29

## 2020-03-21 NOTE — Progress Notes (Signed)
SATURATION QUALIFICATIONS: (This note is used to comply with regulatory documentation for home oxygen)  Patient Saturations on Room Air at Rest =86%  Patient Saturations on Room Air while Ambulating = N/A%  Patient Saturations on 2 Liters of oxygen while Ambulating =N/A%  Please briefly explain why patient needs home oxygen:

## 2020-03-21 NOTE — Telephone Encounter (Signed)
Noted  

## 2020-03-21 NOTE — Progress Notes (Signed)
PT taken to private vehicle with mother. Vss.  Home O2 to be delivered to pts home per supervisor.  Hospital oxygen tank given to caregiver, tank full, tubing given per supervisor.  Family demonstrates understanding on how to use tank.  Family in contact with supervisor and [place]

## 2020-03-21 NOTE — TOC Progression Note (Addendum)
Transition of Care [facility]) - Progression Note    Patient Details  Name: Cindy Oconnor MRN: [id number] Date of Birth: 25-Jul-1970  Transition of Care [facility]) CM/SW Contact  Anselm Pancoast, RN Phone Number: 03/21/2020, 6:54 PM  Clinical Narrative:    Updated patient ready for discharge back to group home once receives Oxygen. Spoke to [employer] @ [employer] to request oxygen for discharge today. LVMM for mother and father to obtain needed information for Oxygen and discharge disposition/transportation. Zack states [facility] can try and get Oxygen delivered today.   7:07pm: Spoke to mother, Mrs. [name] in patients room and confirmed mother is taking patient home with her for the weekend to recover and will be providing transportation. Notified Zack @ [facility] patient would be discharged home today with Oxygen.          Expected Discharge Plan and Services           Expected Discharge Date: 03/21/20                                     Social Determinants of Health (SDOH) Interventions    Readmission Risk Interventions No flowsheet data found.

## 2020-03-24 ENCOUNTER — Telehealth: Payer: Self-pay

## 2020-03-24 LAB — CULTURE, BLOOD (ROUTINE X 2)
Culture: NO GROWTH
Special Requests: ADEQUATE

## 2020-03-24 NOTE — Telephone Encounter (Signed)
Transition Care Management Unsuccessful Follow-up Telephone Call  Date of discharge and from where:  03/21/20 from [facility]  Attempts:  1st Attempt  Reason for unsuccessful TCM follow-up call:  Left voice message. Will follow as appropriate.

## 2020-03-25 ENCOUNTER — Ambulatory Visit: Payer: Medicare Other | Admitting: Physician Assistant

## 2020-03-25 DIAGNOSIS — R0602 Shortness of breath: Secondary | ICD-10-CM | POA: Diagnosis not present

## 2020-03-25 DIAGNOSIS — Z23 Encounter for immunization: Secondary | ICD-10-CM | POA: Diagnosis not present

## 2020-03-25 DIAGNOSIS — Q909 Down syndrome, unspecified: Secondary | ICD-10-CM | POA: Diagnosis not present

## 2020-03-25 NOTE — Telephone Encounter (Signed)
Transition Care Management Unsuccessful Follow-up Telephone Call  Date of discharge and from where:  03/21/20  Attempts:  2nd Attempt  Reason for unsuccessful TCM follow-up call:  Unable to reach patient. Called mother's number first, no answer, unable to leave a message. Called patient phone number on file and reached, [facility]. [name] stated, "Cindy Oconnor is still with her mom right now." Nurse will follow as appropriate with 3rd attempt tomorrow.

## 2020-03-26 ENCOUNTER — Ambulatory Visit: Payer: Medicare Other | Admitting: Family

## 2020-03-26 NOTE — Telephone Encounter (Signed)
Transition Care Management Unsuccessful Follow-up Telephone Call  Date of discharge and from where:  03/21/20  Attempts:  3rd Attempt  Reason for unsuccessful TCM follow-up call:  Unable to reach patient.  Called patient listed number and got caregiver [name] who remarks of Cyril Mourning still being in her mother's care. Called mother's preferred and secondary number, no answer. Please keep all scheduled appointments and follow up with primary care provider as scheduled 03/28/20 @ 11:00.

## 2020-03-28 ENCOUNTER — Encounter: Payer: Self-pay | Admitting: Internal Medicine

## 2020-03-28 ENCOUNTER — Other Ambulatory Visit: Payer: Self-pay

## 2020-03-28 ENCOUNTER — Ambulatory Visit (INDEPENDENT_AMBULATORY_CARE_PROVIDER_SITE_OTHER): Payer: Medicare Other | Admitting: Internal Medicine

## 2020-03-28 DIAGNOSIS — J4541 Moderate persistent asthma with (acute) exacerbation: Secondary | ICD-10-CM | POA: Diagnosis not present

## 2020-03-28 DIAGNOSIS — D518 Other vitamin B12 deficiency anemias: Secondary | ICD-10-CM

## 2020-03-28 DIAGNOSIS — E538 Deficiency of other specified B group vitamins: Secondary | ICD-10-CM

## 2020-03-28 DIAGNOSIS — J9611 Chronic respiratory failure with hypoxia: Secondary | ICD-10-CM | POA: Diagnosis not present

## 2020-03-28 DIAGNOSIS — R0602 Shortness of breath: Secondary | ICD-10-CM | POA: Diagnosis not present

## 2020-03-28 DIAGNOSIS — H6123 Impacted cerumen, bilateral: Secondary | ICD-10-CM

## 2020-03-28 DIAGNOSIS — Z09 Encounter for follow-up examination after completed treatment for conditions other than malignant neoplasm: Secondary | ICD-10-CM | POA: Diagnosis not present

## 2020-03-28 DIAGNOSIS — J9621 Acute and chronic respiratory failure with hypoxia: Secondary | ICD-10-CM | POA: Diagnosis not present

## 2020-03-28 DIAGNOSIS — I313 Pericardial effusion (noninflammatory): Secondary | ICD-10-CM

## 2020-03-28 DIAGNOSIS — Q909 Down syndrome, unspecified: Secondary | ICD-10-CM | POA: Diagnosis not present

## 2020-03-28 DIAGNOSIS — R0981 Nasal congestion: Secondary | ICD-10-CM | POA: Diagnosis not present

## 2020-03-28 DIAGNOSIS — I3139 Other pericardial effusion (noninflammatory): Secondary | ICD-10-CM

## 2020-03-28 DIAGNOSIS — J4540 Moderate persistent asthma, uncomplicated: Secondary | ICD-10-CM | POA: Diagnosis not present

## 2020-03-28 MED ORDER — AYR SALINE NASAL NO-DRIP NA GEL: NASAL | 0 refills | Status: DC

## 2020-03-28 NOTE — Progress Notes (Signed)
Subjective:  Patient ID: Cindy Oconnor, female    DOB: [birth year]  Age: 49 y.o. MRN: [id number]  CC: Diagnoses of Pericardial effusion, Bilateral hearing loss due to cerumen impaction, Macrocytic anemia with vitamin B12 deficiency, B12 deficiency, Down's syndrome, Moderate persistent chronic asthma with acute exacerbation, and Hospital discharge follow-up were pertinent to this visit.  HPI Cindy Oconnor presents for hospital follow up She is accompanied by her mother.   Patient is a 49 yr old female with history of Down's Syndrome,  Asthma,  And chronic inflammatory arthritis who was admitted to [facility] on Oct 20 with chest pain, nausea and vomiting with dry heaves.  She was wheezing and hypoxic at admission .  CTA lungs,  CT chest abdomen and pelvis done.  No acute changes were noted .  A small pericardial effusion was noted and evaluated by cardiology with no intervention recommended.  She was treated empirically for a UTI , given steroids for the asthma exacerbation and discharged home on supplemental oxygen on October 22.   cardiology follow up was done on Monday.  She remains hypoxic without 02; her sats drop to  85%  ,  Wearing 2 L/min but mother has increased her rate to 3L   . Has been having nosebleeds since wearing oxygen   Feeling better ,  Appetite good.  Medications were reconciled.     Review of Systems;  Patient denies headache, fevers, malaise, unintentional weight loss, skin rash, eye pain, sinus congestion and sinus pain, sore throat, dysphagia,  hemoptysis , cough, dyspnea, wheezing, chest pain, palpitations, orthopnea, edema, abdominal pain, nausea, melena, diarrhea, constipation, flank pain, dysuria, hematuria, urinary  Frequency, nocturia, numbness, tingling, seizures,  Focal weakness, Loss of consciousness,  Tremor, insomnia, depression, anxiety, and suicidal ideation.      Objective:  BP 110/80   Pulse 99   Temp 98.2 F (36.8 C)   Ht 5' 2"  (1.575 m)   Wt 160  lb (72.6 kg)   LMP 04/11/1981   SpO2 (!) 87%   BMI 29.26 kg/m   BP Readings from Last 3 Encounters:  03/28/20 110/80  03/21/20 120/72  09/14/19 102/86    Wt Readings from Last 3 Encounters:  03/28/20 160 lb (72.6 kg)  03/20/20 164 lb 9.6 oz (74.7 kg)  09/14/19 158 lb 3.2 oz (71.8 kg)    General appearance: alert, cooperative and appears stated age Ears: normal TM's and external ear canals both ears Throat: lips, mucosa, and tongue normal; teeth and gums normal Neck: no adenopathy, no carotid bruit, supple, symmetrical, trachea midline and thyroid not enlarged, symmetric, no tenderness/mass/nodules Back: symmetric, no curvature. ROM normal. No CVA tenderness. Lungs: clear to auscultation bilaterally Heart: regular rate and rhythm, S1, S2 normal, no murmur, click, rub or gallop Abdomen: soft, non-tender; bowel sounds normal; no masses,  no organomegaly Pulses: 2+ and symmetric Skin: Skin color, texture, turgor normal. No rashes or lesions Lymph nodes: Cervical, supraclavicular, and axillary nodes normal.  Lab Results  Component Value Date   HGBA1C 5.6 08/17/2018   HGBA1C 5.5 12/28/2017   HGBA1C 5.7 06/27/2013    Lab Results  Component Value Date   CREATININE 0.66 03/21/2020   CREATININE 0.83 03/20/2020   CREATININE 0.90 03/19/2020    Lab Results  Component Value Date   WBC 6.9 03/21/2020   HGB 11.0 (L) 03/21/2020   HCT 33.1 (L) 03/21/2020   PLT 217 03/21/2020   GLUCOSE 107 (H) 03/21/2020   CHOL 174 05/19/2015  TRIG 97.0 05/19/2015   HDL 47.50 05/19/2015   LDLCALC 107 (H) 05/19/2015   ALT 19 03/19/2020   AST 37 03/19/2020   NA 140 03/21/2020   K 3.6 03/21/2020   CL 102 03/21/2020   CREATININE 0.66 03/21/2020   BUN 12 03/21/2020   CO2 30 03/21/2020   TSH 3.26 08/17/2018   INR 0.95 07/28/2017   HGBA1C 5.6 08/17/2018    CT Abdomen Pelvis Wo Contrast  Result Date: 03/19/2020 CLINICAL DATA:  49 year old female with nausea and vomiting. EXAM: CT ABDOMEN  AND PELVIS WITHOUT CONTRAST TECHNIQUE: Multidetector CT imaging of the abdomen and pelvis was performed following the standard protocol without IV contrast. COMPARISON:  CT abdomen pelvis dated 05/17/2016. FINDINGS: Evaluation of this exam is limited in the absence of intravenous contrast as well as due to respiratory motion artifact. Lower chest: Minimal bibasilar atelectasis. The visualized lung bases are otherwise clear. There is pleural lipomatosis. Partially visualized pericardial effusion measuring 8 mm in thickness. Echocardiogram may provide better evaluation if clinically indicated. No intra-abdominal free air or free fluid. Hepatobiliary: Subcentimeter hypodense focus in the left lobe of the liver is not characterized but may represent a cyst. This is similar to prior CT. Apparent ill-defined 13 mm lesion in the posterior right lobe of the liver (segment VII) on image 12/2, likely artifactual and related to motion. MRI may provide better evaluation. No intrahepatic biliary ductal dilatation. The gallbladder is unremarkable. Pancreas: Unremarkable. No pancreatic ductal dilatation or surrounding inflammatory changes. Spleen: Normal in size without focal abnormality. Adrenals/Urinary Tract: The adrenal glands unremarkable. There is no hydronephrosis or nephrolithiasis on either side. The visualized ureters and urinary bladder appear unremarkable. Stomach/Bowel: There is sigmoid diverticulosis without active inflammatory changes. There is no bowel obstruction or active inflammation. Normal appendix. Vascular/Lymphatic: The abdominal aorta and IVC unremarkable. No portal venous gas. There is no adenopathy. Reproductive: Hysterectomy.  No adnexal masses. Other: None Musculoskeletal: Osteopenia with scoliosis and multilevel degenerative changes of the spine. No acute osseous pathology. IMPRESSION: 1. No acute intra-abdominal or pelvic pathology. 2. Sigmoid diverticulosis. No bowel obstruction. Normal appendix. 3.  Partially visualized pericardial effusion. Echocardiogram may provide better evaluation if clinically indicated. Electronically Signed   By: Anner Crete M.D.   On: 03/19/2020 01:59   CT Angio Chest PE W/Cm &/Or Wo Cm  Result Date: 03/19/2020 CLINICAL DATA:  Chest pain for 90 minutes. Neck pain and vomiting. Pulmonary embolus is suspected with high probability. EXAM: CT ANGIOGRAPHY CHEST WITH CONTRAST TECHNIQUE: Multidetector CT imaging of the chest was performed using the standard protocol during bolus administration of intravenous contrast. Multiplanar CT image reconstructions and MIPs were obtained to evaluate the vascular anatomy. CONTRAST:  81m OMNIPAQUE IOHEXOL 350 MG/ML SOLN COMPARISON:  06/27/2013 FINDINGS: Cardiovascular: Motion artifact limits examination. There is good opacification of the central and segmental pulmonary arteries. No focal filling defects. No evidence of significant pulmonary embolus. Normal heart size. Moderate pericardial effusion. Normal caliber thoracic aorta. No aortic dissection. Great vessel origins are patent. Mediastinum/Nodes: Scattered mediastinal lymph nodes are moderately prominent without pathologic enlargement, probably reactive. Esophagus is decompressed. Lungs/Pleura: Prominent pleural fat in the bases. Atelectasis in the lung bases. No consolidation or airspace disease. No effusions. Airways are patent. Upper Abdomen: Diffuse fatty infiltration of the liver. No acute changes identified in the upper abdomen. Musculoskeletal: No chest wall abnormality. No acute or significant osseous findings. Review of the MIP images confirms the above findings. IMPRESSION: 1. No evidence of significant pulmonary embolus. 2. Moderate pericardial  effusion. 3. Atelectasis in the lung bases. 4. Diffuse fatty infiltration of the liver. Electronically Signed   By: Lucienne Capers M.D.   On: 03/19/2020 03:36   DG Chest Port 1 View  Result Date: 03/19/2020 CLINICAL DATA:  Chest  pain and shortness of breath. EXAM: PORTABLE CHEST 1 VIEW COMPARISON:  April 23, 2019 FINDINGS: Mild, diffuse, chronic appearing increased interstitial lung markings are seen. There is no evidence of a pleural effusion or pneumothorax. The heart size and mediastinal contours are within normal limits. Multiple chronic right-sided rib fractures are seen. These are present on the prior study. IMPRESSION: Chronic appearing increased interstitial lung markings without evidence of acute or active cardiopulmonary disease. Electronically Signed   By: Virgina Norfolk M.D.   On: 03/19/2020 00:40   ECHOCARDIOGRAM COMPLETE  Result Date: 03/19/2020    ECHOCARDIOGRAM REPORT   Patient Name:   COURTNY BENNISON Date of Exam: 03/19/2020 Medical Rec #:  [id number]          Height:       62.0 in Accession #:    [id number]         Weight:       158.3 lb Date of Birth:  11-15-1970          BSA:          1.731 m Patient Age:    19 years           BP:           99/71 mmHg Patient Gender: F                  HR:           75 bpm. Exam Location:  [facility] Procedure: 2D Echo, Cardiac Doppler and Color Doppler Indications:     Pericardial Effusion 423.9  History:         Patient has no prior history of Echocardiogram examinations.                  COPD. DVT.  Sonographer:     Sherrie Sport RDCS (AE) Referring Phys:  [id number] Arvil Chaco Diagnosing Phys: Kate Sable MD  Sonographer Comments: Suboptimal apical window. IMPRESSIONS  1. Left ventricular ejection fraction, by estimation, is 50 to 55%. The left ventricle has low normal function. The left ventricle has no regional wall motion abnormalities. Left ventricular diastolic parameters were normal.  2. Right ventricular systolic function is normal. The right ventricular size is normal.  3. A small pericardial effusion is present. The pericardial effusion is circumferential. There is no evidence of cardiac tamponade.  4. The mitral valve is normal in structure. No evidence of  mitral valve regurgitation.  5. The aortic valve was not well visualized. Aortic valve regurgitation is not visualized. FINDINGS  Left Ventricle: Left ventricular ejection fraction, by estimation, is 50 to 55%. The left ventricle has low normal function. The left ventricle has no regional wall motion abnormalities. The left ventricular internal cavity size was normal in size. There is no left ventricular hypertrophy. Left ventricular diastolic parameters were normal. Right Ventricle: The right ventricular size is normal. No increase in right ventricular wall thickness. Right ventricular systolic function is normal. Left Atrium: Left atrial size was normal in size. Right Atrium: Right atrial size was normal in size. Pericardium: A small pericardial effusion is present. The pericardial effusion is circumferential. There is no evidence of cardiac tamponade. Mitral Valve: The mitral valve is normal in structure. No evidence  of mitral valve regurgitation. Tricuspid Valve: The tricuspid valve is grossly normal. Tricuspid valve regurgitation is not demonstrated. Aortic Valve: The aortic valve was not well visualized. Aortic valve regurgitation is not visualized. Aortic valve mean gradient measures 4.5 mmHg. Aortic valve peak gradient measures 7.3 mmHg. Aortic valve area, by VTI measures 2.21 cm. Pulmonic Valve: The pulmonic valve was not well visualized. Pulmonic valve regurgitation is not visualized. Aorta: The aortic root is normal in size and structure. Venous: The inferior vena cava was not well visualized. IAS/Shunts: No atrial level shunt detected by color flow Doppler.  LEFT VENTRICLE PLAX 2D LVIDd:         3.37 cm  Diastology LVIDs:         2.05 cm  LV e' medial:    9.03 cm/s LV PW:         1.13 cm  LV E/e' medial:  10.5 LV IVS:        1.20 cm  LV e' lateral:   7.18 cm/s LVOT diam:     2.00 cm  LV E/e' lateral: 13.2 LV SV:         48 LV SV Index:   28 LVOT Area:     3.14 cm  RIGHT VENTRICLE RV Basal diam:  2.87  cm RV S prime:     11.20 cm/s TAPSE (M-mode): 2.4 cm LEFT ATRIUM             Index       RIGHT ATRIUM           Index LA diam:        3.10 cm 1.79 cm/m  RA Area:     14.70 cm LA Vol (A2C):   37.6 ml 21.72 ml/m RA Volume:   40.40 ml  23.34 ml/m LA Vol (A4C):   13.0 ml 7.51 ml/m LA Biplane Vol: 24.2 ml 13.98 ml/m  AORTIC VALVE                   PULMONIC VALVE AV Area (Vmax):    2.01 cm    PV Vmax:        1.29 m/s AV Area (Vmean):   1.62 cm    PV Peak grad:   6.7 mmHg AV Area (VTI):     2.21 cm    RVOT Peak grad: 8 mmHg AV Vmax:           135.50 cm/s AV Vmean:          96.950 cm/s AV VTI:            0.219 m AV Peak Grad:      7.3 mmHg AV Mean Grad:      4.5 mmHg LVOT Vmax:         86.50 cm/s LVOT Vmean:        49.900 cm/s LVOT VTI:          0.154 m LVOT/AV VTI ratio: 0.70  AORTA Ao Root diam: 2.20 cm MITRAL VALVE               TRICUSPID VALVE MV Area (PHT): 2.52 cm    TR Peak grad:   24.4 mmHg MV Decel Time: 301 msec    TR Vmax:        247.00 cm/s MV E velocity: 95.10 cm/s MV A velocity: 79.10 cm/s  SHUNTS MV E/A ratio:  1.20        Systemic VTI:  0.15 m  Systemic Diam: 2.00 cm Kate Sable MD Electronically signed by Kate Sable MD Signature Date/Time: 03/19/2020/5:16:40 PM    Final     Assessment & Plan:   Problem List Items Addressed This Visit      Unprioritized   Asthma, chronic    With recent exacerbation resulting in hospitalization and continued supplemental oxygen.  She has follow up with Pulmonology      Down's syndrome    She continues to live in a group home setting .  She has no cardiac issues related to her diagnosis per cardiology      Macrocytic anemia with vitamin B12 deficiency    hgb dropped during recent admission . It is unclear if she is still receiving B12 supplementation.  Follow up labs ordered   Lab Results  Component Value Date   VITAMINB12 260 06/06/2017         Relevant Orders   B12 and Folate Panel   CBC with  Differential/Platelet   B12 deficiency    Diagnosed in 2019.   Facility was supposed to be providing im supplements       Relevant Orders   Intrinsic Factor Antibodies   Methylmalonic Acid   Bilateral hearing loss due to cerumen impaction    The hospitalists have discontinued use of liquid colace.       Pericardial effusion    Etiology remains unclear;  ECHO showed no evidence of restriction of LV function.  Follow up with Cardiology  Is planned for next week.       Hospital discharge follow-up    Patient is stable post discharge and has no new issues or questions about discharge plans at the visit today for hospital follow up. All labs , imaging studies and progress notes from admission were reviewed with patient today           I have discontinued Cindy Oconnor's omeprazole, Ear Drops, and Cough DM. I have also changed her methotrexate and azelastine. Additionally, I am having her maintain her Bioflexor, clotrimazole-betamethasone, calcium-vitamin D, folic acid, predniSONE, D3-1000, senna, Therems-M, Probiotic, budesonide-formoterol, montelukast, traMADol, docusate sodium, levothyroxine, diclofenac Sodium, BD Eclipse Syringe, ketoconazole, Blue Gel, Guaiatussin AC, acetaminophen, levalbuterol, Pantoprazole Sodium (PROTONIX IV), and Ayr Saline Nasal No-Drip.  Meds ordered this encounter  Medications  . DISCONTD: Ayr Saline Nasal No-Drip GEL    Sig: Place in nose twice daily or as needed for dry nose    Dispense:  22 mL    Refill:  0  . Ayr Saline Nasal No-Drip GEL    Sig: Place in nose twice daily or as needed for dry nose    Dispense:  22 mL    Refill:  0  . methotrexate (RHEUMATREX) 2.5 MG tablet    Sig: TAKE 7 TABLETS (17.5 MG) BY MOUTH ONCE AWEEK ON FRIDAY.    Dispense:  28 tablet    Refill:  3    REFILL FOR NEXT MONTH. THANKS  . azelastine (OPTIVAR) 0.05 % ophthalmic solution    Sig: PLACE 1 DROP EACH EYE TWICE DAILY. WAIT 3-5 MINUTES BETWEEN 2 EYE MEDS     Dispense:  6 mL    Refill:  10    Medications Discontinued During This Encounter  Medication Reason  . omeprazole (PRILOSEC) 20 MG capsule Change in therapy  . Ayr Saline Nasal No-Drip GEL Reorder  . EAR DROPS 6.5 % OTIC solution   . COUGH DM 30 MG/5ML liquid   . methotrexate (RHEUMATREX) 2.5 MG tablet   .  azelastine (OPTIVAR) 0.05 % ophthalmic solution     Follow-up: No follow-ups on file.   Crecencio Mc, MD

## 2020-03-30 ENCOUNTER — Encounter: Payer: Self-pay | Admitting: Internal Medicine

## 2020-03-30 DIAGNOSIS — Z09 Encounter for follow-up examination after completed treatment for conditions other than malignant neoplasm: Secondary | ICD-10-CM | POA: Insufficient documentation

## 2020-03-30 MED ORDER — AZELASTINE HCL 0.05 % OP SOLN: OPHTHALMIC | 10 refills | Status: DC

## 2020-03-30 MED ORDER — METHOTREXATE 2.5 MG PO TABS: ORAL_TABLET | ORAL | 3 refills | Status: DC

## 2020-03-30 NOTE — Assessment & Plan Note (Signed)
Diagnosed in 2019.   Facility was supposed to be providing im supplements

## 2020-03-30 NOTE — Assessment & Plan Note (Addendum)
She continues to live in a group home setting .  She has no cardiac issues related to her diagnosis per cardiology

## 2020-03-30 NOTE — Assessment & Plan Note (Signed)
Etiology remains unclear;  ECHO showed no evidence of restriction of LV function.  Follow up with Cardiology  Is planned for next week.

## 2020-03-30 NOTE — Assessment & Plan Note (Signed)
Patient is stable post discharge and has no new issues or questions about discharge plans at the visit today for hospital follow up. All labs , imaging studies and progress notes from admission were reviewed with patient today   

## 2020-03-30 NOTE — Assessment & Plan Note (Signed)
The hospitalists have discontinued use of liquid colace.

## 2020-03-30 NOTE — Assessment & Plan Note (Signed)
hgb dropped during recent admission . It is unclear if she is still receiving B12 supplementation.  Follow up labs ordered   Lab Results  Component Value Date   VITAMINB12 260 06/06/2017

## 2020-03-30 NOTE — Assessment & Plan Note (Signed)
With recent exacerbation resulting in hospitalization and continued supplemental oxygen.  She has follow up with Pulmonology

## 2020-03-31 ENCOUNTER — Telehealth: Payer: Self-pay

## 2020-03-31 DIAGNOSIS — J9611 Chronic respiratory failure with hypoxia: Secondary | ICD-10-CM

## 2020-03-31 NOTE — Telephone Encounter (Signed)
Cindy Oconnor with [facility] called and stated that they need b12 injection ordered again for them to be able to administer. They also wanted to DC albuterol due to allergic reaction. She also has questions about oxygen. Please call back at [phone]

## 2020-04-02 MED ORDER — CYANOCOBALAMIN 1000 MCG/ML IJ SOLN
1000.0000 ug | INTRAMUSCULAR | 11 refills | Status: DC
Start: 2020-04-02 — End: 2021-04-22

## 2020-04-02 NOTE — Telephone Encounter (Signed)
Letter has been faxed.

## 2020-04-02 NOTE — Telephone Encounter (Signed)
Cindy Oconnor stated that b12 injections were d/c'd at discharge and was wanting to know if pt needs to continue receiving them once a month. Pt had been getting them once a month until she came home from hospital. D/c order for albuterol has been signed and faxed. DME order for portable O2 concentrator has been signed and faxed.

## 2020-04-02 NOTE — Telephone Encounter (Signed)
Cindy Oconnor, care giver called about message below. Please call her back at [phone].

## 2020-04-02 NOTE — Telephone Encounter (Signed)
Yes, please reusme   Letter printed  rx sent

## 2020-04-04 DIAGNOSIS — I361 Nonrheumatic tricuspid (valve) insufficiency: Secondary | ICD-10-CM | POA: Diagnosis not present

## 2020-04-04 DIAGNOSIS — I371 Nonrheumatic pulmonary valve insufficiency: Secondary | ICD-10-CM | POA: Diagnosis not present

## 2020-04-04 DIAGNOSIS — J9611 Chronic respiratory failure with hypoxia: Secondary | ICD-10-CM | POA: Diagnosis not present

## 2020-04-04 DIAGNOSIS — J9621 Acute and chronic respiratory failure with hypoxia: Secondary | ICD-10-CM | POA: Diagnosis not present

## 2020-04-08 DIAGNOSIS — M79674 Pain in right toe(s): Secondary | ICD-10-CM | POA: Diagnosis not present

## 2020-04-08 DIAGNOSIS — L97521 Non-pressure chronic ulcer of other part of left foot limited to breakdown of skin: Secondary | ICD-10-CM | POA: Diagnosis not present

## 2020-04-08 DIAGNOSIS — M2041 Other hammer toe(s) (acquired), right foot: Secondary | ICD-10-CM | POA: Diagnosis not present

## 2020-04-08 DIAGNOSIS — B351 Tinea unguium: Secondary | ICD-10-CM | POA: Diagnosis not present

## 2020-04-08 DIAGNOSIS — M2042 Other hammer toe(s) (acquired), left foot: Secondary | ICD-10-CM | POA: Diagnosis not present

## 2020-04-08 DIAGNOSIS — M79675 Pain in left toe(s): Secondary | ICD-10-CM | POA: Diagnosis not present

## 2020-04-08 DIAGNOSIS — L600 Ingrowing nail: Secondary | ICD-10-CM | POA: Diagnosis not present

## 2020-04-15 ENCOUNTER — Telehealth: Payer: Self-pay

## 2020-04-15 NOTE — Telephone Encounter (Signed)
Medication list has been updated based on the list faxed to [place] from [facility].

## 2020-04-29 DIAGNOSIS — J4540 Moderate persistent asthma, uncomplicated: Secondary | ICD-10-CM | POA: Diagnosis not present

## 2020-05-14 ENCOUNTER — Encounter: Payer: Self-pay | Admitting: Emergency Medicine

## 2020-05-14 ENCOUNTER — Emergency Department: Payer: Medicare Other

## 2020-05-14 ENCOUNTER — Emergency Department
Admission: EM | Admit: 2020-05-14 | Discharge: 2020-05-14 | Disposition: A | Discharge status: Home / Self Care | Payer: Medicare Other | Attending: Emergency Medicine | Admitting: Emergency Medicine

## 2020-05-14 ENCOUNTER — Other Ambulatory Visit: Payer: Self-pay

## 2020-05-14 DIAGNOSIS — E039 Hypothyroidism, unspecified: Secondary | ICD-10-CM | POA: Diagnosis not present

## 2020-05-14 DIAGNOSIS — Z8616 Personal history of COVID-19: Secondary | ICD-10-CM | POA: Diagnosis not present

## 2020-05-14 DIAGNOSIS — K529 Noninfective gastroenteritis and colitis, unspecified: Secondary | ICD-10-CM | POA: Diagnosis not present

## 2020-05-14 DIAGNOSIS — R1110 Vomiting, unspecified: Secondary | ICD-10-CM | POA: Diagnosis not present

## 2020-05-14 DIAGNOSIS — K219 Gastro-esophageal reflux disease without esophagitis: Secondary | ICD-10-CM | POA: Insufficient documentation

## 2020-05-14 DIAGNOSIS — R109 Unspecified abdominal pain: Secondary | ICD-10-CM | POA: Diagnosis not present

## 2020-05-14 DIAGNOSIS — Z96653 Presence of artificial knee joint, bilateral: Secondary | ICD-10-CM | POA: Diagnosis not present

## 2020-05-14 DIAGNOSIS — R9431 Abnormal electrocardiogram [ECG] [EKG]: Secondary | ICD-10-CM | POA: Diagnosis not present

## 2020-05-14 DIAGNOSIS — J449 Chronic obstructive pulmonary disease, unspecified: Secondary | ICD-10-CM | POA: Diagnosis not present

## 2020-05-14 DIAGNOSIS — Z79899 Other long term (current) drug therapy: Secondary | ICD-10-CM | POA: Diagnosis not present

## 2020-05-14 DIAGNOSIS — Z7951 Long term (current) use of inhaled steroids: Secondary | ICD-10-CM | POA: Diagnosis not present

## 2020-05-14 LAB — COMPREHENSIVE METABOLIC PANEL
ALT: 15 U/L (ref 0–44)
AST: 29 U/L (ref 15–41)
Albumin: 3.8 g/dL (ref 3.5–5.0)
Alkaline Phosphatase: 62 U/L (ref 38–126)
Anion gap: 8 (ref 5–15)
BUN: 12 mg/dL (ref 6–20)
CO2: 29 mmol/L (ref 22–32)
Calcium: 9 mg/dL (ref 8.9–10.3)
Chloride: 104 mmol/L (ref 98–111)
Creatinine, Ser: 0.89 mg/dL (ref 0.44–1.00)
GFR, Estimated: >60 mL/min (ref >60)
Glucose, Bld: 125 mg/dL — ABNORMAL HIGH (ref 70–99)
Potassium: 4.3 mmol/L (ref 3.5–5.1)
Sodium: 141 mmol/L (ref 135–145)
Total Bilirubin: 0.6 mg/dL (ref 0.3–1.2)
Total Protein: 7.7 g/dL (ref 6.5–8.1)

## 2020-05-14 LAB — URINALYSIS, COMPLETE (UACMP) WITH MICROSCOPIC
Bacteria, UA: NONE SEEN
Bilirubin Urine: NEGATIVE
Glucose, UA: NEGATIVE mg/dL
Hgb urine dipstick: NEGATIVE
Ketones, ur: NEGATIVE mg/dL
Leukocytes,Ua: NEGATIVE
Nitrite: NEGATIVE
Protein, ur: NEGATIVE mg/dL
Specific Gravity, Urine: 1.015 (ref 1.005–1.030)
Squamous Epithelial / HPF: NONE SEEN (ref 0–5)
pH: 6 (ref 5.0–8.0)

## 2020-05-14 LAB — CBC
HCT: 41.1 % (ref 36.0–46.0)
Hemoglobin: 13.5 g/dL (ref 12.0–15.0)
MCH: 35.1 pg — ABNORMAL HIGH (ref 26.0–34.0)
MCHC: 32.8 g/dL (ref 30.0–36.0)
MCV: 106.8 fL — ABNORMAL HIGH (ref 80.0–100.0)
Platelets: 329 10*3/uL (ref 150–400)
RBC: 3.85 MIL/uL — ABNORMAL LOW (ref 3.87–5.11)
RDW: 15.8 % — ABNORMAL HIGH (ref 11.5–15.5)
WBC: 8 10*3/uL (ref 4.0–10.5)
nRBC: 0 % (ref 0.0–0.2)

## 2020-05-14 LAB — TROPONIN I (HIGH SENSITIVITY): Troponin I (High Sensitivity): 5 ng/L (ref <18)

## 2020-05-14 LAB — LIPASE, BLOOD: Lipase: 27 U/L (ref 11–51)

## 2020-05-14 LAB — PREGNANCY, URINE: Preg Test, Ur: NEGATIVE

## 2020-05-14 MED ORDER — ONDANSETRON HCL 4 MG PO TABS: 4.0000 mg | ORAL_TABLET | Freq: Three times a day (TID) | ORAL | 0 refills | Status: DC | PRN

## 2020-05-14 MED ORDER — LACTATED RINGERS IV BOLUS
1000.0000 mL | Freq: Once | INTRAVENOUS | Status: AC
  Administered 2020-05-14: 16:00:00 1000 mL via INTRAVENOUS

## 2020-05-14 MED ORDER — IOHEXOL 300 MG/ML  SOLN
100.0000 mL | Freq: Once | INTRAMUSCULAR | Status: AC | PRN
  Administered 2020-05-14: 17:00:00 100 mL via INTRAVENOUS
  Filled 2020-05-14: qty 100

## 2020-05-14 NOTE — ED Provider Notes (Signed)
[facility] Emergency Department Provider Note  ____________________________________________   Event Date/Time   First MD Initiated Contact with Patient 05/14/20 1539     (approximate)  I have reviewed the triage vital signs and the nursing notes.   HISTORY  Chief Complaint Emesis   HPI Cindy Oconnor is a 49 y.o. female with a past medical history of DVT, Down syndrome, lupus, arthritis, asthma, ulcerative colitis, COPD, RA on methotrexate and daily prednisone who presents accompanied by her caregiver for assessment of 1 day of abdominal pain associate with nonbloody nonbilious vomiting and some diarrhea.  Patient denies any headache, earache, sore throat, cough, shortness of breath, chest pain but does endorse some burning with urination.  Neither patient nor caregiver is certain when her last bowel movement was.  Caregiver states she gave her some prune juice but patient threw this up prior to arrival.  No other clear alleviating aggravating factors.  Patient has been taking all her medicines as directed.  Patient was in her usual state health today before yesterday.         Past Medical History:  Diagnosis Date  . Arthritis   . Asthma    unspecified  . Blood clot in vein   . Chicken pox   . Colitis   . Collagen vascular disease ([place])   . COPD (chronic obstructive pulmonary disease) ([place])   . COVID-19 virus detected 04/30/2019   Nov 23 tested positive,  LRI symptoms   . Deafness in left ear   . Disease of thyroid gland   . Down syndrome   . DVT (deep venous thrombosis) ([place])   . Fractures   . GERD (gastroesophageal reflux disease)   . Hypothyroidism    unspecified  . 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 Positive anti-CCP antibodies, negative rheumatoid factor, positive FANA. b. Methotrexate, Prednisone, Plaquenil.   . Joint pain   . Lupus ([place]) 1995  . Osteoarthritis 01/30/2014   a. Lumbar disc disease. b. Trigger nodules  .  Osteoporosis 01/30/2014   a. Boniva  . RA (rheumatoid arthritis) ([place])   . Shingles   . Ulcerative (chronic) enterocolitis ([place])   . Ulcerative colitis [facility])     Patient Active Problem List   Diagnosis Date Noted  . Hospital discharge follow-up 03/30/2020  . Pericardial effusion 03/19/2020  . Bilateral hearing loss due to cerumen impaction 09/14/2019  . Educated about COVID-19 virus infection 10/19/2018  . Allergic conjunctivitis, bilateral 11/13/2017  . Long-term use of high-risk medication 10/09/2017  . S/P total knee arthroplasty 08/10/2017  . B12 deficiency 06/22/2017  . Primary osteoarthritis of right knee 06/02/2017  . Hypocalcemia 03/15/2017  . Status post total left knee replacement 01/10/2017  . Macrocytic anemia with vitamin B12 deficiency 01/08/2017  . Preoperative clearance 01/08/2017  . Moderate persistent asthma with acute exacerbation 09/14/2016  . Ulcerative colitis, chronic ([place]) 09/26/2015  . Chronic inflammatory arthritis 01/30/2014  . Osteoporosis 01/30/2014  . Mild tricuspid regurgitation 02/13/2013  . Routine general medical examination at a health care facility 01/24/2013  . Obesity (BMI 35.0-39.9 without comorbidity) 01/21/2012  . Seronegative rheumatoid arthritis of both hands ([place]) 06/11/2006  . S/P TAH (total abdominal hysterectomy)  and partial vaginectomy 06/11/2006  . Hypothyroid 03/18/2006  . Asthma, chronic 03/18/2006  . GERD 03/18/2006  . Down's syndrome 03/18/2006    Past Surgical History:  Procedure Laterality Date  . ABDOMINAL HYSTERECTOMY    . ABDOMINAL HYSTERECTOMY W/ PARTIAL [place]  . BUNIONECTOMY Bilateral  Bunionectomy great toe  . COLONOSCOPY  04/16/2013   Hx of ulcerative colitis - repeat 2 years per Dr. Rayann Heman  . COLONOSCOPY  09/24/2003   Dr. Brita Romp  . FOOT SURGERY Bilateral    x2  . HALLUX VALGUS CORRECTION    . KNEE ARTHROPLASTY Left 01/10/2017   Procedure: COMPUTER ASSISTED TOTAL KNEE ARTHROPLASTY;   Surgeon: Dereck Leep, MD;  Location: [facility] ORS;  Service: Orthopedics;  Laterality: Left;  . KNEE ARTHROPLASTY Right 08/10/2017   Procedure: COMPUTER ASSISTED TOTAL KNEE ARTHROPLASTY;  Surgeon: Dereck Leep, MD;  Location: [facility] ORS;  Service: Orthopedics;  Laterality: Right;    Prior to Admission medications   Medication Sig Start Date End Date Taking? Authorizing Provider  alendronate (FOSAMAX) 70 MG tablet Take 70 mg by mouth once a week. Take with a full glass of water on an empty stomach.    [provider]  azelastine (OPTIVAR) 0.05 % ophthalmic solution PLACE 1 DROP EACH EYE TWICE DAILY. WAIT 3-5 MINUTES BETWEEN 2 EYE MEDS 03/30/20   Crecencio Mc, MD  BD ECLIPSE SYRINGE 25G X 1" 3 ML MISC FOR USE WITH CYANOCOBALAMIN 01/25/20   Crecencio Mc, MD  BLUE GEL 2 % GEL APPLY TOPICALLY TO PAINFUL JOINTS 2 TO 3 TIMES DAILY AS NEEDED 01/28/20   Crecencio Mc, MD  budesonide-formoterol (SYMBICORT) 160-4.5 MCG/ACT inhaler INHALE 2 PUFFS ONCE DAILY (1-2 MINUTES BETWEEN PUFFS) 03/26/19   Crecencio Mc, MD  calcium-vitamin D (OSCAL WITH D) 500-200 MG-UNIT TABS tablet TAKEK 1 TABLET BY MOUTH EVERY DAY FOR SUPPLEMENT 12/21/18   Crecencio Mc, MD  carbamide peroxide (DEBROX) 6.5 % OTIC solution 5 drops 2 (two) times daily.    [provider]  clotrimazole-betamethasone (LOTRISONE) cream APPLY TOPICALLY 2 TIMES PER DAY 01/19/18   Crecencio Mc, MD  cyanocobalamin (,VITAMIN B-12,) 1000 MCG/ML injection Inject 1 mL (1,000 mcg total) into the muscle every 30 (thirty) days. 30th of the Month 04/02/20   Crecencio Mc, MD  D3-1000 25 MCG (1000 UT) tablet TAKEK 1 TABLET BY MOUTH EVERY DAY FOR SUPPLEMENT 12/21/18   Crecencio Mc, MD  diclofenac Sodium (VOLTAREN) 1 % GEL Apply 2 g topically 4 (four) times daily. As needed for joint pain 09/20/19   Crecencio Mc, MD  docusate sodium (COLACE) 100 MG capsule TAKE 1 CAPSULE BY MOUTH EVERY OTHER DAY 07/16/19   Crecencio Mc, MD  folic  acid (FOLVITE) 1 MG tablet TAKE 1 TABLET BY MOUTH EVERY DAY 12/21/18   Crecencio Mc, MD  GUAIATUSSIN AC 100-10 MG/5ML syrup TAKE 5MLS BY MOUTH THREE TIMES A DAY AS NEEDED FOR COUGH 01/28/20   Crecencio Mc, MD  ketoconazole (NIZORAL) 2 % cream APPLY TOIPICALLY ONCE DAILY 01/28/20   Crecencio Mc, MD  levalbuterol [facility] HFA) 45 MCG/ACT inhaler Inhale 1 puff into the lungs every 4 (four) hours as needed for wheezing. 03/21/20 03/21/21  Pokhrel, Corrie Mckusick, MD  levothyroxine (SYNTHROID) 125 MCG tablet Take 1 tablet (125 mcg total) by mouth daily before breakfast. 07/24/19   Crecencio Mc, MD  methotrexate (RHEUMATREX) 2.5 MG tablet TAKE 7 TABLETS (17.5 MG) BY MOUTH ONCE AWEEK ON FRIDAY. 03/30/20   Crecencio Mc, MD  montelukast (SINGULAIR) 10 MG tablet Take 1 tablet (10 mg total) by mouth at bedtime. 06/14/19   Crecencio Mc, MD  Multiple Vitamins-Minerals (THEREMS-M) TABS TAKE 1 TABLET BY MOUTH EVERY DAY FOR SUPPLEMENT 01/23/19   Deborra Medina  L, MD  omeprazole (PRILOSEC) 20 MG capsule Take 20 mg by mouth daily.    [provider]  ondansetron (ZOFRAN) 4 MG tablet Take 1 tablet (4 mg total) by mouth every 8 (eight) hours as needed for up to 10 doses for nausea or vomiting. 05/14/20   Lucrezia Starch, MD  predniSONE (DELTASONE) 2.5 MG tablet TAKE 1 TABLET BY MOUTH EACH DAY 12/21/18   Crecencio Mc, MD  senna (SENOKOT) 8.6 MG TABS tablet TAKE 2 TABLETS(17.2 MG) BY MOUTH AT BEDTIME 01/23/19   Crecencio Mc, MD  traMADol (ULTRAM) 50 MG tablet Take 1 tablet (50 mg total) by mouth daily as needed for moderate pain. 07/17/19   Crecencio Mc, MD    Allergies Erythromycin, Oxycontin [oxycodone], Tramadol, and Albuterol  Family History  Problem Relation Age of Onset  . Heart disease Mother   . Diabetes Mother   . Breast cancer Mother 8  . Heart disease Father   . Diabetes Father   . Alcohol abuse Maternal Grandmother   . Arthritis Maternal Grandmother   . Stroke Maternal  Grandmother   . Diabetes Maternal Grandmother   . Alcohol abuse Maternal Grandfather   . Arthritis Maternal Grandfather   . Stroke Maternal Grandfather   . Diabetes Maternal Grandfather     Social History Social History   Tobacco Use  . Smoking status: Never Smoker  . Smokeless tobacco: Never Used  Vaping Use  . Vaping Use: Never used  Substance Use Topics  . Alcohol use: No  . Drug use: No    Review of Systems  Review of Systems  Constitutional: Negative for chills and fever.  HENT: Negative for sore throat.   Eyes: Negative for pain.  Respiratory: Negative for cough and stridor.   Cardiovascular: Negative for chest pain.  Gastrointestinal: Positive for abdominal pain, constipation, nausea and vomiting.  Genitourinary: Positive for dysuria.  Musculoskeletal: Negative for myalgias.  Skin: Negative for rash.  Neurological: Negative for seizures, loss of consciousness and headaches.  Psychiatric/Behavioral: Negative for suicidal ideas.  All other systems reviewed and are negative.     ____________________________________________   PHYSICAL EXAM:  VITAL SIGNS: ED Triage Vitals  Enc Vitals Group     BP 05/14/20 1144 114/65     Pulse Rate 05/14/20 1144 70     Resp 05/14/20 1144 17     Temp 05/14/20 1144 98.6 F (37 C)     Temp Source 05/14/20 1144 Oral     SpO2 05/14/20 1144 100 %     Weight 05/14/20 1145 160 lb (72.6 kg)     Height 05/14/20 1145 4' 9"  (1.448 m)     Head Circumference --      Peak Flow --      Pain Score 05/14/20 1145 10     Pain Loc --      Pain Edu? --      Excl. in [place]? --    Vitals:   05/14/20 1144 05/14/20 1453  BP: 114/65 108/68  Pulse: 70 61  Resp: 17 16  Temp: 98.6 F (37 C) 98.6 F (37 C)  SpO2: 100% 100%   Physical Exam Vitals and nursing note reviewed.  Constitutional:      General: She is not in acute distress.    Appearance: She is well-developed and well-nourished.  HENT:     Head: Normocephalic and atraumatic.      Right Ear: External ear normal.     Left Ear: External  ear normal.     Nose: Nose normal.  Eyes:     Conjunctiva/sclera: Conjunctivae normal.  Cardiovascular:     Rate and Rhythm: Normal rate and regular rhythm.     Heart sounds: No murmur heard.   Pulmonary:     Effort: Pulmonary effort is normal. No respiratory distress.     Breath sounds: Normal breath sounds.  Abdominal:     Palpations: Abdomen is soft.     Tenderness: There is no abdominal tenderness.  Musculoskeletal:        General: No edema.     Cervical back: Neck supple.  Skin:    General: Skin is warm and dry.  Neurological:     Mental Status: She is alert. Mental status is at baseline.  Psychiatric:        Mood and Affect: Mood and affect and mood normal.      ____________________________________________   LABS (all labs ordered are listed, but only abnormal results are displayed)  Labs Reviewed  COMPREHENSIVE METABOLIC PANEL - Abnormal; Notable for the following components:      Result Value   Glucose, Bld 125 (*)    All other components within normal limits  CBC - Abnormal; Notable for the following components:   RBC 3.85 (*)    MCV 106.8 (*)    MCH 35.1 (*)    RDW 15.8 (*)    All other components within normal limits  URINALYSIS, COMPLETE (UACMP) WITH MICROSCOPIC - Abnormal; Notable for the following components:   Color, Urine YELLOW (*)    APPearance CLEAR (*)    All other components within normal limits  URINE CULTURE  LIPASE, BLOOD  PREGNANCY, URINE  TROPONIN I (HIGH SENSITIVITY)   ____________________________________________  EKG  Sinus rhythm with a ventricular rate of 72, right axis deviation, nonspecific ST changes in inferior anterior and lateral leads. ____________________________________________  RADIOLOGY   Official radiology report(s): CT ABDOMEN PELVIS W CONTRAST  Result Date: 05/14/2020 CLINICAL DATA:  Acute abdominal pain and vomiting. EXAM: CT ABDOMEN AND PELVIS WITH  CONTRAST TECHNIQUE: Multidetector CT imaging of the abdomen and pelvis was performed using the standard protocol following bolus administration of intravenous contrast. CONTRAST:  173m OMNIPAQUE IOHEXOL 300 MG/ML  SOLN COMPARISON:  Most recent CT 03/19/2020 FINDINGS: Lower chest: Tiny Bochdalek hernia on the right with chronic herniation of omental fat into the right lower hemithorax. Heart is normal in size. Similar retrocrural lymph node adjacent to the descending aorta from prior. Hepatobiliary: Small cyst in the left lobe of the liver. Focal fatty infiltration adjacent to the falciform ligament. Undulation of the mid lateral right diaphragm containing portion of the liver, unchanged. There is no definite correlate to the questioned ill-defined lesion in the posterior right lobe on prior exam. Unremarkable gallbladder with Phrygian cap. No pericholecystic inflammation or biliary dilatation. No gallstone. Pancreas: Unremarkable. No pancreatic ductal dilatation or surrounding inflammatory changes. Spleen: Normal in size without focal abnormality. Adrenals/Urinary Tract: Normal adrenal glands. No hydronephrosis or perinephric edema. Homogeneous renal enhancement with symmetric excretion on delayed phase imaging. Urinary bladder is physiologically distended. There is mild diffuse wall thickening. Stomach/Bowel: Nondistended stomach. Normal positioning of the ligament of Treitz. Moderate length segment of small bowel wall thickening, perienteric inflammation and intraluminal fluid in the left and central abdomen typical of enteritis. There is no evidence of obstruction. No pneumatosis or perforation. There is no terminal ileal inflammation. The appendix is not confidently visualized, there is no evidence of appendicitis. Chronic interposition of transverse  colon posterior to small bowel loops in the left abdomen. Scattered distal colonic diverticula without diverticulitis. The sigmoid colon is redundant. There is no  colonic wall thickening or inflammation. Vascular/Lymphatic: Normal caliber abdominal aorta. Patent portal vein. There prominent central mesenteric lymph nodes largest measuring 7 mm short axis. No retroperitoneal or pelvic adenopathy. Reproductive: Status post hysterectomy. No adnexal masses. Other: Mesenteric edema with small volume of free fluid in the mesentery, right upper quadrant, and tracking into the pelvis. No abscess or free air. Musculoskeletal: Degenerative disc disease at L1-L2 with Modic endplate changes. Near complete disc space loss at L4-L5, unchanged. There are no acute or suspicious osseous abnormalities. IMPRESSION: 1. Moderate length segment of small bowel wall thickening, perienteric inflammation in the left and central abdomen typical of enteritis, likely infectious or inflammatory. No evidence of obstruction, pneumatosis or perforation. 2. Mild diffuse bladder wall thickening, may be reactive or can be seen with cystitis. 3. Colonic diverticulosis without diverticulitis. 4. Small Bochdalek hernia on the right with herniation of omental fat, stable from prior. Aortic Atherosclerosis (ICD10-I70.0). Electronically Signed   By: Keith Rake M.D.   On: 05/14/2020 17:42    ____________________________________________   PROCEDURES  Procedure(s) performed (including Critical Care):  Procedures   ____________________________________________   INITIAL IMPRESSION / ASSESSMENT AND PLAN / ED COURSE      Patient presents above to history exam for assessment of some abdominal pain associate with nonbloody nonbilious emesis.  Patient's caregiver thinks patient may be constipated although they are not sure when her last bowel movement was.  On exam she is nontender throughout her abdomen.  She is afebrile and hemodynamically stable.  While she does have some nonspecific changes noted on her ECG given nonelevated troponin obtained greater than 3 hours after symptom onset very low  suspicion for ACS.  Lipase is not consistent with acute pancreatitis.  CMP shows no significant lecture metabolic derangements and unremarkable LFTs.  No evidence of cholestasis.  In addition there is no tenderness on exam to suggest acute cholecystitis.  CBC is unremarkable.  UA does not appear infected.  CT obtained does show evidence of some enteritis which I suspect is likely infectious although given patient does have history of ulcerative colitis rheumatological enteritis is also differential although felt less likely at this time.  There is no evidence of obstruction, abscess, perforation or other acute intra-abdominal process.  No evidence of diverticulitis or pyelonephritis.  There is evidence of atherosclerosis.  Patient on my reassessment after receiving IV fluids states she felt much better and was noted to be tolerating p.o.  Her caregiver is requesting refill for Zofran which was prescribed.  Given she is tolerating p.o. with stable vital signs otherwise reassuring exam and work-up with plan for close outpatient PCP and GI follow-up if she is safe for discharge.  I did discuss this plan with her mother and caregiver at bedside.  Discharged in stable condition.       ____________________________________________   FINAL CLINICAL IMPRESSION(S) / ED DIAGNOSES  Final diagnoses:  Enteritis    Medications  lactated ringers bolus 1,000 mL (1,000 mLs Intravenous New Bag/Given 05/14/20 1615)  iohexol (OMNIPAQUE) 300 MG/ML solution 100 mL (100 mLs Intravenous Contrast Given 05/14/20 1709)     ED Discharge Orders         Ordered    ondansetron (ZOFRAN) 4 MG tablet  Every 8 hours PRN        05/14/20 1753  Note:  This document was prepared using Dragon voice recognition software and may include unintentional dictation errors.   Lucrezia Starch, MD 05/14/20 [phone]

## 2020-05-14 NOTE — ED Notes (Signed)
Attempted to contact legal guardian, no answer

## 2020-05-14 NOTE — ED Notes (Addendum)
Patient transported to CT 

## 2020-05-14 NOTE — ED Triage Notes (Signed)
Pt comes into the ED via POV c/o abdominal pain and emesis.  Pt states the vomiting started last night.  Pt denies any diarrhea or SHOB, but states her stomach pain is upper pain into the chest and she has some dizziness with ambulation.  Pt has even and unlabored respirations at this time and in NAD.

## 2020-05-15 LAB — URINE CULTURE: Culture: <10000 — AB

## 2020-05-16 ENCOUNTER — Telehealth: Payer: Self-pay

## 2020-05-16 NOTE — Telephone Encounter (Signed)
Pt was in ED on Weds. 05/14/20 and they told her she needs a follow up. She was seen for a gastro virus. I asked if they could do a virtual on Monday but they don't have the capability to do a virtual visit. Isaias Sakai wants to know when she can be scheduled for an in office appointment or does Dr. Derrel Nip wants them to do just a telephone visit?

## 2020-05-19 NOTE — Telephone Encounter (Signed)
Spoke with caregiver at [employer] and scheduled pt an in office appt.

## 2020-05-19 NOTE — Telephone Encounter (Signed)
If she is no longer having vomiting or diarrhea,  She can schedule an in office follow up whenever available.  If symptoms are not improving, she'll need a telephone visit with someone this week

## 2020-05-19 NOTE — Telephone Encounter (Signed)
Only appts available are the virtual/telephone covid appts.

## 2020-05-19 NOTE — Telephone Encounter (Signed)
See unrouted response from me

## 2020-05-26 ENCOUNTER — Encounter: Payer: Self-pay | Admitting: Internal Medicine

## 2020-05-26 ENCOUNTER — Ambulatory Visit (INDEPENDENT_AMBULATORY_CARE_PROVIDER_SITE_OTHER): Payer: Medicare Other | Admitting: Internal Medicine

## 2020-05-26 ENCOUNTER — Other Ambulatory Visit: Payer: Self-pay

## 2020-05-26 VITALS — BP 90/60 | HR 85 | Temp 96.6°F | Ht <= 58 in | Wt 160.0 lb

## 2020-05-26 DIAGNOSIS — E538 Deficiency of other specified B group vitamins: Secondary | ICD-10-CM

## 2020-05-26 DIAGNOSIS — E034 Atrophy of thyroid (acquired): Secondary | ICD-10-CM

## 2020-05-26 DIAGNOSIS — Z23 Encounter for immunization: Secondary | ICD-10-CM

## 2020-05-26 DIAGNOSIS — I700 Atherosclerosis of aorta: Secondary | ICD-10-CM | POA: Diagnosis not present

## 2020-05-26 DIAGNOSIS — K529 Noninfective gastroenteritis and colitis, unspecified: Secondary | ICD-10-CM

## 2020-05-26 DIAGNOSIS — D7589 Other specified diseases of blood and blood-forming organs: Secondary | ICD-10-CM

## 2020-05-26 DIAGNOSIS — K760 Fatty (change of) liver, not elsewhere classified: Secondary | ICD-10-CM

## 2020-05-26 MED ORDER — ATORVASTATIN CALCIUM 20 MG PO TABS: 20.0000 mg | ORAL_TABLET | Freq: Every day | ORAL | 3 refills | Status: DC

## 2020-05-26 NOTE — Patient Instructions (Signed)
You have fatty liver and atherosclerosis . These were seen on your CT SCAN last week.  You need to stop eating cookies and ice cream on a daily basis.  No sugar added ice cream  Sugar free italian ice is ok BUT NOT DAILY   Fruit juice/soda unless it is sugar free.    You should be snacking on carrots ,  Vegetables and  fruit.   You need to start walking every day . Your goal is work your way up to 30 minutes   We are starting atorvastatin to help prevent heart attacks and progression of fatty liver.  I want you to return for bloodwork in 3 weeks

## 2020-05-26 NOTE — Progress Notes (Signed)
Subjective:  Patient ID: Cindy Oconnor, female    DOB: 07-08-1970  Age: 49 y.o. MRN: [id number]  CC: The primary encounter diagnosis was Fatty liver. Diagnoses of Need for immunization against influenza, B12 deficiency, Gastroenteritis, Macrocytosis without anemia, Abdominal aortic atherosclerosis ([place]), Hepatic steatosis, and Hypothyroidism due to acquired atrophy of thyroid were also pertinent to this visit.  HPI [name] presents for ER follow up.  Acc by [place] from the group home.  She was evaluated and treated by ER on Dec 15  For nausea and vomiting, with back pain . Marland Kitchen  CT done.  UTI ruled out.  Symptoms lasted 48  Hours,  Was fine by Saturday 18th.  No known sick contacts , but had visited with mother   Fatty liver and aortic atherosclerosis noted  And discussed with patient. Weight loss and statin therapy advised.   Labs reviewed: she has a macrocytosis without anemia  ;  Last B12 level was < 300 in 2019.   h as been getting b12 injections  Monthly since then.    Lab Results  Component Value Date   VITAMINB12 260 06/06/2017     Outpatient Medications Prior to Visit  Medication Sig Dispense Refill  . alendronate (FOSAMAX) 70 MG tablet Take 70 mg by mouth once a week. Take with a full glass of water on an empty stomach.    Marland Kitchen azelastine (OPTIVAR) 0.05 % ophthalmic solution PLACE 1 DROP EACH EYE TWICE DAILY. WAIT 3-5 MINUTES BETWEEN 2 EYE MEDS 6 mL 10  . BD ECLIPSE SYRINGE 25G X 1" 3 ML MISC FOR USE WITH CYANOCOBALAMIN 1 each 10  . BLUE GEL 2 % GEL APPLY TOPICALLY TO PAINFUL JOINTS 2 TO 3 TIMES DAILY AS NEEDED 0.9 g 11  . budesonide-formoterol (SYMBICORT) 160-4.5 MCG/ACT inhaler INHALE 2 PUFFS ONCE DAILY (1-2 MINUTES BETWEEN PUFFS) 10.2 g 11  . calcium-vitamin D (OSCAL WITH D) 500-200 MG-UNIT TABS tablet TAKEK 1 TABLET BY MOUTH EVERY DAY FOR SUPPLEMENT 30 tablet 5  . carbamide peroxide (DEBROX) 6.5 % OTIC solution 5 drops 2 (two) times daily.    .  clotrimazole-betamethasone (LOTRISONE) cream APPLY TOPICALLY 2 TIMES PER DAY 45 g 2  . cyanocobalamin (,VITAMIN B-12,) 1000 MCG/ML injection Inject 1 mL (1,000 mcg total) into the muscle every 30 (thirty) days. 30th of the Month 1 mL 11  . D3-1000 25 MCG (1000 UT) tablet TAKEK 1 TABLET BY MOUTH EVERY DAY FOR SUPPLEMENT 30 tablet 5  . diclofenac Sodium (VOLTAREN) 1 % GEL Apply 2 g topically 4 (four) times daily. As needed for joint pain 350 g 6  . docusate sodium (COLACE) 100 MG capsule TAKE 1 CAPSULE BY MOUTH EVERY OTHER DAY 15 capsule 2  . folic acid (FOLVITE) 1 MG tablet TAKE 1 TABLET BY MOUTH EVERY DAY 30 tablet 2  . GUAIATUSSIN AC 100-10 MG/5ML syrup TAKE 5MLS BY MOUTH THREE TIMES A DAY AS NEEDED FOR COUGH 180 mL 0  . ketoconazole (NIZORAL) 2 % cream APPLY TOIPICALLY ONCE DAILY 60 g 11  . levalbuterol (XOPENEX HFA) 45 MCG/ACT inhaler Inhale 1 puff into the lungs every 4 (four) hours as needed for wheezing. 1 each 2  . levothyroxine (SYNTHROID) 125 MCG tablet Take 1 tablet (125 mcg total) by mouth daily before breakfast. 90 tablet 0  . methotrexate (RHEUMATREX) 2.5 MG tablet TAKE 7 TABLETS (17.5 MG) BY MOUTH ONCE AWEEK ON FRIDAY. 28 tablet 3  . montelukast (SINGULAIR) 10 MG tablet Take 1 tablet (  10 mg total) by mouth at bedtime. 90 tablet 1  . Multiple Vitamins-Minerals (THEREMS-M) TABS TAKE 1 TABLET BY MOUTH EVERY DAY FOR SUPPLEMENT 90 tablet 1  . omeprazole (PRILOSEC) 20 MG capsule Take 20 mg by mouth daily.    . ondansetron (ZOFRAN) 4 MG tablet Take 1 tablet (4 mg total) by mouth every 8 (eight) hours as needed for up to 10 doses for nausea or vomiting. 10 tablet 0  . predniSONE (DELTASONE) 2.5 MG tablet TAKE 1 TABLET BY MOUTH EACH DAY 30 tablet 11  . senna (SENOKOT) 8.6 MG TABS tablet TAKE 2 TABLETS(17.2 MG) BY MOUTH AT BEDTIME 120 tablet 11  . traMADol (ULTRAM) 50 MG tablet Take 1 tablet (50 mg total) by mouth daily as needed for moderate pain. 30 tablet 5   No facility-administered  medications prior to visit.    Review of Systems;  Patient denies headache, fevers, malaise, unintentional weight loss, skin rash, eye pain, sinus congestion and sinus pain, sore throat, dysphagia,  hemoptysis , cough, dyspnea, wheezing, chest pain, palpitations, orthopnea, edema, abdominal pain, nausea, melena, diarrhea, constipation, flank pain, dysuria, hematuria, urinary  Frequency, nocturia, numbness, tingling, seizures,  Focal weakness, Loss of consciousness,  Tremor, insomnia, depression, anxiety, and suicidal ideation.      Objective:  BP 90/60 (BP Location: Left Arm, Patient Position: Sitting)   Pulse 85   Temp (!) 96.6 F (35.9 C)   Ht 4' 9.01" (1.448 m)   Wt 160 lb (72.6 kg)   LMP 04/11/1981   SpO2 98%   BMI 34.61 kg/m   BP Readings from Last 3 Encounters:  05/26/20 90/60  05/14/20 110/76  03/28/20 110/80    Wt Readings from Last 3 Encounters:  05/26/20 160 lb (72.6 kg)  05/14/20 160 lb (72.6 kg)  03/28/20 160 lb (72.6 kg)    General appearance: alert, cooperative and appears stated age Ears: normal TM's and external ear canals both ears Throat: lips, mucosa, and tongue normal; teeth and gums normal Neck: no adenopathy, no carotid bruit, supple, symmetrical, trachea midline and thyroid not enlarged, symmetric, no tenderness/mass/nodules Back: symmetric, no curvature. ROM normal. No CVA tenderness. Lungs: clear to auscultation bilaterally Heart: regular rate and rhythm, S1, S2 normal, no murmur, click, rub or gallop Abdomen: soft, non-tender; bowel sounds normal; no masses,  no organomegaly Pulses: 2+ and symmetric Skin: Skin color, texture, turgor normal. No rashes or lesions Lymph nodes: Cervical, supraclavicular, and axillary nodes normal.  Lab Results  Component Value Date   HGBA1C 5.6 08/17/2018   HGBA1C 5.5 12/28/2017   HGBA1C 5.7 06/27/2013    Lab Results  Component Value Date   CREATININE 0.89 05/14/2020   CREATININE 0.66 03/21/2020    CREATININE 0.83 03/20/2020    Lab Results  Component Value Date   WBC 8.0 05/14/2020   HGB 13.5 05/14/2020   HCT 41.1 05/14/2020   PLT 329 05/14/2020   GLUCOSE 125 (H) 05/14/2020   CHOL 174 05/19/2015   TRIG 97.0 05/19/2015   HDL 47.50 05/19/2015   LDLCALC 107 (H) 05/19/2015   ALT 15 05/14/2020   AST 29 05/14/2020   NA 141 05/14/2020   K 4.3 05/14/2020   CL 104 05/14/2020   CREATININE 0.89 05/14/2020   BUN 12 05/14/2020   CO2 29 05/14/2020   TSH 3.26 08/17/2018   INR 0.95 07/28/2017   HGBA1C 5.6 08/17/2018    CT ABDOMEN PELVIS W CONTRAST  Result Date: 05/14/2020 CLINICAL DATA:  Acute abdominal pain and vomiting. EXAM:  CT ABDOMEN AND PELVIS WITH CONTRAST TECHNIQUE: Multidetector CT imaging of the abdomen and pelvis was performed using the standard protocol following bolus administration of intravenous contrast. CONTRAST:  154m OMNIPAQUE IOHEXOL 300 MG/ML  SOLN COMPARISON:  Most recent CT 03/19/2020 FINDINGS: Lower chest: Tiny Bochdalek hernia on the right with chronic herniation of omental fat into the right lower hemithorax. Heart is normal in size. Similar retrocrural lymph node adjacent to the descending aorta from prior. Hepatobiliary: Small cyst in the left lobe of the liver. Focal fatty infiltration adjacent to the falciform ligament. Undulation of the mid lateral right diaphragm containing portion of the liver, unchanged. There is no definite correlate to the questioned ill-defined lesion in the posterior right lobe on prior exam. Unremarkable gallbladder with Phrygian cap. No pericholecystic inflammation or biliary dilatation. No gallstone. Pancreas: Unremarkable. No pancreatic ductal dilatation or surrounding inflammatory changes. Spleen: Normal in size without focal abnormality. Adrenals/Urinary Tract: Normal adrenal glands. No hydronephrosis or perinephric edema. Homogeneous renal enhancement with symmetric excretion on delayed phase imaging. Urinary bladder is  physiologically distended. There is mild diffuse wall thickening. Stomach/Bowel: Nondistended stomach. Normal positioning of the ligament of Treitz. Moderate length segment of small bowel wall thickening, perienteric inflammation and intraluminal fluid in the left and central abdomen typical of enteritis. There is no evidence of obstruction. No pneumatosis or perforation. There is no terminal ileal inflammation. The appendix is not confidently visualized, there is no evidence of appendicitis. Chronic interposition of transverse colon posterior to small bowel loops in the left abdomen. Scattered distal colonic diverticula without diverticulitis. The sigmoid colon is redundant. There is no colonic wall thickening or inflammation. Vascular/Lymphatic: Normal caliber abdominal aorta. Patent portal vein. There prominent central mesenteric lymph nodes largest measuring 7 mm short axis. No retroperitoneal or pelvic adenopathy. Reproductive: Status post hysterectomy. No adnexal masses. Other: Mesenteric edema with small volume of free fluid in the mesentery, right upper quadrant, and tracking into the pelvis. No abscess or free air. Musculoskeletal: Degenerative disc disease at L1-L2 with Modic endplate changes. Near complete disc space loss at L4-L5, unchanged. There are no acute or suspicious osseous abnormalities. IMPRESSION: 1. Moderate length segment of small bowel wall thickening, perienteric inflammation in the left and central abdomen typical of enteritis, likely infectious or inflammatory. No evidence of obstruction, pneumatosis or perforation. 2. Mild diffuse bladder wall thickening, may be reactive or can be seen with cystitis. 3. Colonic diverticulosis without diverticulitis. 4. Small Bochdalek hernia on the right with herniation of omental fat, stable from prior. Aortic Atherosclerosis (ICD10-I70.0). Electronically Signed   By: MKeith RakeM.D.   On: 05/14/2020 17:42    Assessment & Plan:   Problem  List Items Addressed This Visit      Unprioritized   Hypothyroid    Thyroid function has been  WNL on current dose. Repeat level is overdue    Lab Results  Component Value Date   TSH 3.26 08/17/2018         Relevant Orders   TSH   Gastroenteritis    Episode of recurrent N/V lasting < 48 hours,  Resolved spontaneously.  No further workup      B12 deficiency   Relevant Orders   Vitamin B12   Abdominal aortic atherosclerosis ([place]    Reviewed findings of prior CT scan today..  Patient is willing to  Initiate statin therpay starting with 20 mg atorvastatin  daily and return for liver panel in 3 week s      Relevant Medications  atorvastatin (LIPITOR) 20 MG tablet   Hepatic steatosis    Presumed by changes noted on recent CT scan. .  Current liver enzymes are normal and all modifiable risk factors including obesity, diabetes and hyperlipidemia have been addressed today         Other Visit Diagnoses    Fatty liver    -  Primary   Relevant Orders   Comprehensive metabolic panel   Need for immunization against influenza       Relevant Orders   Flu Vaccine QUAD 36+ mos IM (Completed)   Macrocytosis without anemia       Relevant Orders   RBC Folate     I provided  30 minutes of  face-to-face time during this encounter reviewing patient's current problems and past surgeries, labs and imaging studies, providing counseling on the above mentioned problems , and coordination  of care .  I am having Trinitee L. Empson start on atorvastatin. I am also having her maintain her clotrimazole-betamethasone, calcium-vitamin D, folic acid, predniSONE, D3-1000, senna, [place]-M, budesonide-formoterol, montelukast, traMADol, docusate sodium, levothyroxine, diclofenac Sodium, BD Eclipse Syringe, ketoconazole, Blue Gel, Guaiatussin AC, levalbuterol, methotrexate, azelastine, cyanocobalamin, alendronate, omeprazole, carbamide peroxide, and ondansetron.  Meds ordered this encounter  Medications   . atorvastatin (LIPITOR) 20 MG tablet    Sig: Take 1 tablet (20 mg total) by mouth daily.    Dispense:  90 tablet    Refill:  3    There are no discontinued medications.  Follow-up: Return in about 3 weeks (around 06/16/2020).   Crecencio Mc, MD

## 2020-05-27 ENCOUNTER — Telehealth: Payer: Self-pay

## 2020-05-27 DIAGNOSIS — I700 Atherosclerosis of aorta: Secondary | ICD-10-CM | POA: Insufficient documentation

## 2020-05-27 DIAGNOSIS — K760 Fatty (change of) liver, not elsewhere classified: Secondary | ICD-10-CM | POA: Insufficient documentation

## 2020-05-27 NOTE — Assessment & Plan Note (Signed)
Presumed by changes noted on recent CT scan. .  Current liver enzymes are normal and all modifiable risk factors including obesity, diabetes and hyperlipidemia have been addressed today

## 2020-05-27 NOTE — Assessment & Plan Note (Signed)
Episode of recurrent N/V lasting < 48 hours,  Resolved spontaneously.  No further workup

## 2020-05-27 NOTE — Telephone Encounter (Signed)
Yes ok to print a return to work immediately letter

## 2020-05-27 NOTE — Assessment & Plan Note (Signed)
Thyroid function has been  WNL on current dose. Repeat level is overdue    Lab Results  Component Value Date   TSH 3.26 08/17/2018

## 2020-05-27 NOTE — Telephone Encounter (Signed)
Pt's care taker [place] said she forgot to ask Dr. Derrel Nip for a letter for pt to be able to go back to workshop. She is needing a call back when this is ready.  [phone]

## 2020-05-27 NOTE — Assessment & Plan Note (Signed)
Reviewed findings of prior CT scan today..  Patient is willing to  Initiate statin therpay starting with 20 mg atorvastatin  daily and return for liver panel in 3 week s

## 2020-05-27 NOTE — Telephone Encounter (Signed)
Is it okay to type up letter?

## 2020-05-28 NOTE — Telephone Encounter (Signed)
Letter has been printed and signed. Caregiver is aware that letter is ready for pick up. Caregiver stated that she would be by the office to pick it up in the morning.

## 2020-06-24 ENCOUNTER — Telehealth: Payer: Self-pay | Admitting: Internal Medicine

## 2020-06-24 NOTE — Telephone Encounter (Signed)
Patient's nurse will call back to let us know where she is going to do virtual or reschedule this appt

## 2020-06-25 ENCOUNTER — Ambulatory Visit: Payer: Medicare Other | Admitting: Internal Medicine

## 2020-07-01 DIAGNOSIS — B351 Tinea unguium: Secondary | ICD-10-CM | POA: Diagnosis not present

## 2020-07-01 DIAGNOSIS — M79674 Pain in right toe(s): Secondary | ICD-10-CM | POA: Diagnosis not present

## 2020-07-01 DIAGNOSIS — M79675 Pain in left toe(s): Secondary | ICD-10-CM | POA: Diagnosis not present

## 2020-07-02 ENCOUNTER — Encounter: Payer: Self-pay | Admitting: Internal Medicine

## 2020-07-02 ENCOUNTER — Ambulatory Visit (INDEPENDENT_AMBULATORY_CARE_PROVIDER_SITE_OTHER): Payer: Medicare Other | Admitting: Internal Medicine

## 2020-07-02 ENCOUNTER — Other Ambulatory Visit: Payer: Self-pay

## 2020-07-02 DIAGNOSIS — M1990 Unspecified osteoarthritis, unspecified site: Secondary | ICD-10-CM

## 2020-07-02 DIAGNOSIS — I700 Atherosclerosis of aorta: Secondary | ICD-10-CM | POA: Diagnosis not present

## 2020-07-02 DIAGNOSIS — N611 Abscess of the breast and nipple: Secondary | ICD-10-CM

## 2020-07-02 DIAGNOSIS — M1711 Unilateral primary osteoarthritis, right knee: Secondary | ICD-10-CM | POA: Diagnosis not present

## 2020-07-02 MED ORDER — DOXYCYCLINE HYCLATE 100 MG PO TABS
100.0000 mg | ORAL_TABLET | Freq: Two times a day (BID) | ORAL | 0 refills | Status: DC
Start: 2020-07-02 — End: 2020-11-17

## 2020-07-02 MED ORDER — ALIGN 4 MG PO CAPS: ORAL_CAPSULE | ORAL | 0 refills | Status: DC

## 2020-07-02 NOTE — Patient Instructions (Addendum)
   I am treating the "boil" on your right breast with  A 7 day course of doxycycline .  Take it 2 times daily with food    Daily use of Probiotics for 4 weeks advised to reduce risk of C dificile colitis.

## 2020-07-02 NOTE — Progress Notes (Signed)
Subjective:  Patient ID: Cindy Oconnor, female    DOB: 11-Jul-1970  Age: 50 y.o. MRN: [id number]  CC: Diagnoses of Chronic inflammatory arthritis, Furuncle of breast, Primary osteoarthritis of right knee, and Abdominal aortic atherosclerosis ([place]) were pertinent to this visit.  HPI Cindy Oconnor presents for follow up on chronic conditions   This visit occurred during the SARS-CoV-2 public health emergency.  Safety protocols were in place, including screening questions prior to the visit, additional usage of staff PPE, and extensive cleaning of exam room while observing appropriate contact time as indicated for disinfecting solutions.   Ron is a 50 yr old female with Down's syndrome who lives in a group home and is here today for follow up and completion of FL-2  .  Marland Kitchen She has rheumatoid arthritis affecting the joints in her right hand and is scheduled to see her rheumatologist on Feb 22 She is taking methotrexate .  She has OA and is s/p knee replacement but continues to have knee pain managed with tylenol and diclofenac.  She is morbidly obese and has fatty liver.   She reports that she has developed a "boil" on the underside of her right breast that has been there for "  A while"  (she cannot be more specific)    Outpatient Medications Prior to Visit  Medication Sig Dispense Refill  . alendronate (FOSAMAX) 70 MG tablet Take 70 mg by mouth once a week. Take with a full glass of water on an empty stomach.    Marland Kitchen atorvastatin (LIPITOR) 20 MG tablet Take 1 tablet (20 mg total) by mouth daily. 90 tablet 3  . clotrimazole-betamethasone (LOTRISONE) cream APPLY TOPICALLY 2 TIMES PER DAY 45 g 2  . cyanocobalamin (,VITAMIN B-12,) 1000 MCG/ML injection Inject 1 mL (1,000 mcg total) into the muscle every 30 (thirty) days. 30th of the Month 1 mL 11  . D3-1000 25 MCG (1000 UT) tablet TAKEK 1 TABLET BY MOUTH EVERY DAY FOR SUPPLEMENT 30 tablet 5  . docusate sodium (COLACE) 100 MG capsule TAKE 1  CAPSULE BY MOUTH EVERY OTHER DAY 15 capsule 2  . folic acid (FOLVITE) 1 MG tablet TAKE 1 TABLET BY MOUTH EVERY DAY 30 tablet 2  . ketoconazole (NIZORAL) 2 % cream APPLY TOIPICALLY ONCE DAILY 60 g 11  . levothyroxine (SYNTHROID) 125 MCG tablet Take 1 tablet (125 mcg total) by mouth daily before breakfast. 90 tablet 0  . methotrexate (RHEUMATREX) 2.5 MG tablet TAKE 7 TABLETS (17.5 MG) BY MOUTH ONCE AWEEK ON FRIDAY. 28 tablet 3  . Multiple Vitamins-Minerals (THEREMS-M) TABS TAKE 1 TABLET BY MOUTH EVERY DAY FOR SUPPLEMENT 90 tablet 1  . omeprazole (PRILOSEC) 20 MG capsule Take 20 mg by mouth daily.    . predniSONE (DELTASONE) 2.5 MG tablet TAKE 1 TABLET BY MOUTH EACH DAY 30 tablet 11  . traMADol (ULTRAM) 50 MG tablet Take 1 tablet (50 mg total) by mouth daily as needed for moderate pain. 30 tablet 5  . azelastine (OPTIVAR) 0.05 % ophthalmic solution PLACE 1 DROP EACH EYE TWICE DAILY. WAIT 3-5 MINUTES BETWEEN 2 EYE MEDS (Patient not taking: Reported on 07/02/2020) 6 mL 10  . BD ECLIPSE SYRINGE 25G X 1" 3 ML MISC FOR USE WITH CYANOCOBALAMIN (Patient not taking: Reported on 07/02/2020) 1 each 10  . BLUE GEL 2 % GEL APPLY TOPICALLY TO PAINFUL JOINTS 2 TO 3 TIMES DAILY AS NEEDED (Patient not taking: Reported on 07/02/2020) 0.9 g 11  . budesonide-formoterol (SYMBICORT) 160-4.5  MCG/ACT inhaler INHALE 2 PUFFS ONCE DAILY (1-2 MINUTES BETWEEN PUFFS) (Patient not taking: Reported on 07/02/2020) 10.2 g 11  . calcium-vitamin D (OSCAL WITH D) 500-200 MG-UNIT TABS tablet TAKEK 1 TABLET BY MOUTH EVERY DAY FOR SUPPLEMENT (Patient not taking: Reported on 07/02/2020) 30 tablet 5  . carbamide peroxide (DEBROX) 6.5 % OTIC solution 5 drops 2 (two) times daily. (Patient not taking: Reported on 07/02/2020)    . diclofenac Sodium (VOLTAREN) 1 % GEL Apply 2 g topically 4 (four) times daily. As needed for joint pain (Patient not taking: Reported on 07/02/2020) 350 g 6  . GUAIATUSSIN AC 100-10 MG/5ML syrup TAKE 5MLS BY MOUTH THREE TIMES A DAY AS  NEEDED FOR COUGH (Patient not taking: Reported on 07/02/2020) 180 mL 0  . levalbuterol (XOPENEX HFA) 45 MCG/ACT inhaler Inhale 1 puff into the lungs every 4 (four) hours as needed for wheezing. (Patient not taking: Reported on 07/02/2020) 1 each 2  . montelukast (SINGULAIR) 10 MG tablet Take 1 tablet (10 mg total) by mouth at bedtime. (Patient not taking: Reported on 07/02/2020) 90 tablet 1  . ondansetron (ZOFRAN) 4 MG tablet Take 1 tablet (4 mg total) by mouth every 8 (eight) hours as needed for up to 10 doses for nausea or vomiting. (Patient not taking: Reported on 07/02/2020) 10 tablet 0  . senna (SENOKOT) 8.6 MG TABS tablet TAKE 2 TABLETS(17.2 MG) BY MOUTH AT BEDTIME (Patient not taking: Reported on 07/02/2020) 120 tablet 11   No facility-administered medications prior to visit.    Review of Systems;  Patient denies headache, fevers, malaise, unintentional weight loss, skin rash, eye pain, sinus congestion and sinus pain, sore throat, dysphagia,  hemoptysis , cough, dyspnea, wheezing, chest pain, palpitations, orthopnea, edema, abdominal pain, nausea, melena, diarrhea, constipation, flank pain, dysuria, hematuria, urinary  Frequency, nocturia, numbness, tingling, seizures,  Focal weakness, Loss of consciousness,  Tremor, insomnia, depression, anxiety, and suicidal ideation.      Objective:  BP 103/71 (BP Location: Left Arm, Patient Position: Sitting)   Pulse 76   Temp 98.3 F (36.8 C)   Ht 4' 9.01" (1.448 m)   Wt 156 lb 3.2 oz (70.9 kg)   LMP 04/11/1981   SpO2 96%   BMI 33.79 kg/m   BP Readings from Last 3 Encounters:  07/02/20 103/71  05/26/20 90/60  05/14/20 110/76    Wt Readings from Last 3 Encounters:  07/02/20 156 lb 3.2 oz (70.9 kg)  05/26/20 160 lb (72.6 kg)  05/14/20 160 lb (72.6 kg)    General appearance: alert, cooperative and appears stated age Ears: normal TM's and external ear canals both ears Throat: lips, mucosa, and tongue normal; teeth and gums normal Neck: no  adenopathy, no carotid bruit, supple, symmetrical, trachea midline and thyroid not enlarged, symmetric, no tenderness/mass/nodules Back: symmetric, no curvature. ROM normal. No CVA tenderness. Lungs: clear to auscultation bilaterally Breast:  right breast with localized macular erythema and a nonfluctuant boil.  Heart: regular rate and rhythm, S1, S2 normal, no murmur, click, rub or gallop Abdomen: soft, non-tender; bowel sounds normal; no masses,  no organomegaly Pulses: 2+ and symmetric Skin: Skin color, texture, turgor normal. No rashes or lesions Lymph nodes: Cervical, supraclavicular, and axillary nodes normal.  Lab Results  Component Value Date   HGBA1C 5.6 08/17/2018   HGBA1C 5.5 12/28/2017   HGBA1C 5.7 06/27/2013    Lab Results  Component Value Date   CREATININE 0.89 05/14/2020   CREATININE 0.66 03/21/2020   CREATININE 0.83 03/20/2020  Lab Results  Component Value Date   WBC 8.0 05/14/2020   HGB 13.5 05/14/2020   HCT 41.1 05/14/2020   PLT 329 05/14/2020   GLUCOSE 125 (H) 05/14/2020   CHOL 174 05/19/2015   TRIG 97.0 05/19/2015   HDL 47.50 05/19/2015   LDLCALC 107 (H) 05/19/2015   ALT 15 05/14/2020   AST 29 05/14/2020   NA 141 05/14/2020   K 4.3 05/14/2020   CL 104 05/14/2020   CREATININE 0.89 05/14/2020   BUN 12 05/14/2020   CO2 29 05/14/2020   TSH 3.26 08/17/2018   INR 0.95 07/28/2017   HGBA1C 5.6 08/17/2018    CT ABDOMEN PELVIS W CONTRAST  Result Date: 05/14/2020 CLINICAL DATA:  Acute abdominal pain and vomiting. EXAM: CT ABDOMEN AND PELVIS WITH CONTRAST TECHNIQUE: Multidetector CT imaging of the abdomen and pelvis was performed using the standard protocol following bolus administration of intravenous contrast. CONTRAST:  19m OMNIPAQUE IOHEXOL 300 MG/ML  SOLN COMPARISON:  Most recent CT 03/19/2020 FINDINGS: Lower chest: Tiny Bochdalek hernia on the right with chronic herniation of omental fat into the right lower hemithorax. Heart is normal in size.  Similar retrocrural lymph node adjacent to the descending aorta from prior. Hepatobiliary: Small cyst in the left lobe of the liver. Focal fatty infiltration adjacent to the falciform ligament. Undulation of the mid lateral right diaphragm containing portion of the liver, unchanged. There is no definite correlate to the questioned ill-defined lesion in the posterior right lobe on prior exam. Unremarkable gallbladder with Phrygian cap. No pericholecystic inflammation or biliary dilatation. No gallstone. Pancreas: Unremarkable. No pancreatic ductal dilatation or surrounding inflammatory changes. Spleen: Normal in size without focal abnormality. Adrenals/Urinary Tract: Normal adrenal glands. No hydronephrosis or perinephric edema. Homogeneous renal enhancement with symmetric excretion on delayed phase imaging. Urinary bladder is physiologically distended. There is mild diffuse wall thickening. Stomach/Bowel: Nondistended stomach. Normal positioning of the ligament of Treitz. Moderate length segment of small bowel wall thickening, perienteric inflammation and intraluminal fluid in the left and central abdomen typical of enteritis. There is no evidence of obstruction. No pneumatosis or perforation. There is no terminal ileal inflammation. The appendix is not confidently visualized, there is no evidence of appendicitis. Chronic interposition of transverse colon posterior to small bowel loops in the left abdomen. Scattered distal colonic diverticula without diverticulitis. The sigmoid colon is redundant. There is no colonic wall thickening or inflammation. Vascular/Lymphatic: Normal caliber abdominal aorta. Patent portal vein. There prominent central mesenteric lymph nodes largest measuring 7 mm short axis. No retroperitoneal or pelvic adenopathy. Reproductive: Status post hysterectomy. No adnexal masses. Other: Mesenteric edema with small volume of free fluid in the mesentery, right upper quadrant, and tracking into the  pelvis. No abscess or free air. Musculoskeletal: Degenerative disc disease at L1-L2 with Modic endplate changes. Near complete disc space loss at L4-L5, unchanged. There are no acute or suspicious osseous abnormalities. IMPRESSION: 1. Moderate length segment of small bowel wall thickening, perienteric inflammation in the left and central abdomen typical of enteritis, likely infectious or inflammatory. No evidence of obstruction, pneumatosis or perforation. 2. Mild diffuse bladder wall thickening, may be reactive or can be seen with cystitis. 3. Colonic diverticulosis without diverticulitis. 4. Small Bochdalek hernia on the right with herniation of omental fat, stable from prior. Aortic Atherosclerosis (ICD10-I70.0). Electronically Signed   By: MKeith RakeM.D.   On: 05/14/2020 17:42    Assessment & Plan:   Problem List Items Addressed This Visit      Unprioritized  Abdominal aortic atherosclerosis ([place])    Reviewed findings of prior CT scan today..  Patient is tolerating high potency statin therapy       Chronic inflammatory arthritis    S/p total knee replacement. Continue use of tylenol scheduled , prn diclofenac gel, and methotrexate       Furuncle of breast    Starting empiric antibiotics for MRSA with doxycycline . Advised to change shower soap to Dial soap       Primary osteoarthritis of right knee    S/p total knee replacement. Continue use of tylenol scheduled prn diclofenac gel.          I have discontinued Charrisse L. Bagot's calcium-vitamin D, senna, budesonide-formoterol, montelukast, diclofenac Sodium, BD Eclipse Syringe, Blue Gel, Guaiatussin AC, levalbuterol, azelastine, carbamide peroxide, and ondansetron. I am also having her start on doxycycline and Align. Additionally, I am having her maintain her clotrimazole-betamethasone, folic acid, predniSONE, D3-1000, Therems-M, traMADol, docusate sodium, levothyroxine, ketoconazole, methotrexate, cyanocobalamin,  alendronate, omeprazole, and atorvastatin.  Meds ordered this encounter  Medications  . doxycycline (VIBRA-TABS) 100 MG tablet    Sig: Take 1 tablet (100 mg total) by mouth 2 (two) times daily.    Dispense:  14 tablet    Refill:  0  . Probiotic Product (ALIGN) 4 MG CAPS    Sig: 1 tablet daily for 4 weeks    Dispense:  30 capsule    Refill:  0    Pharmacy make substitute an alternative probiotic    Medications Discontinued During This Encounter  Medication Reason  . azelastine (OPTIVAR) 0.05 % ophthalmic solution Error  . BLUE GEL 2 % GEL Error  . budesonide-formoterol (SYMBICORT) 160-4.5 MCG/ACT inhaler Error  . calcium-vitamin D (OSCAL WITH D) 500-200 MG-UNIT TABS tablet Error  . carbamide peroxide (DEBROX) 6.5 % OTIC solution Error  . diclofenac Sodium (VOLTAREN) 1 % GEL Error  . GUAIATUSSIN AC 100-10 MG/5ML syrup Error  . levalbuterol (XOPENEX HFA) 45 MCG/ACT inhaler Error  . montelukast (SINGULAIR) 10 MG tablet Error  . ondansetron (ZOFRAN) 4 MG tablet Error  . senna (SENOKOT) 8.6 MG TABS tablet Error  . BD ECLIPSE SYRINGE 25G X 1" 3 ML MISC Error    Follow-up: Return in about 6 months (around 12/30/2020).   Crecencio Mc, MD

## 2020-07-03 DIAGNOSIS — N611 Abscess of the breast and nipple: Secondary | ICD-10-CM | POA: Insufficient documentation

## 2020-07-03 NOTE — Assessment & Plan Note (Signed)
S/p total knee replacement. Continue use of tylenol scheduled prn diclofenac gel.

## 2020-07-03 NOTE — Assessment & Plan Note (Signed)
Reviewed findings of prior CT scan today..  Patient is tolerating high potency statin therapy

## 2020-07-03 NOTE — Assessment & Plan Note (Addendum)
Starting empiric antibiotics for MRSA with doxycycline . Advised to change shower soap to Dial soap

## 2020-07-03 NOTE — Assessment & Plan Note (Signed)
S/p total knee replacement. Continue use of tylenol scheduled , prn diclofenac gel, and methotrexate

## 2020-08-19 DIAGNOSIS — Z7983 Long term (current) use of bisphosphonates: Secondary | ICD-10-CM | POA: Diagnosis not present

## 2020-08-19 DIAGNOSIS — R229 Localized swelling, mass and lump, unspecified: Secondary | ICD-10-CM | POA: Diagnosis not present

## 2020-08-19 DIAGNOSIS — R5383 Other fatigue: Secondary | ICD-10-CM | POA: Diagnosis not present

## 2020-08-19 DIAGNOSIS — K5190 Ulcerative colitis, unspecified, without complications: Secondary | ICD-10-CM | POA: Diagnosis not present

## 2020-08-19 DIAGNOSIS — E039 Hypothyroidism, unspecified: Secondary | ICD-10-CM | POA: Diagnosis not present

## 2020-08-19 DIAGNOSIS — Z23 Encounter for immunization: Secondary | ICD-10-CM | POA: Diagnosis not present

## 2020-08-19 DIAGNOSIS — M170 Bilateral primary osteoarthritis of knee: Secondary | ICD-10-CM | POA: Diagnosis not present

## 2020-08-19 DIAGNOSIS — Z79890 Hormone replacement therapy (postmenopausal): Secondary | ICD-10-CM | POA: Diagnosis not present

## 2020-08-19 DIAGNOSIS — M1990 Unspecified osteoarthritis, unspecified site: Secondary | ICD-10-CM | POA: Diagnosis not present

## 2020-08-19 DIAGNOSIS — M79673 Pain in unspecified foot: Secondary | ICD-10-CM | POA: Diagnosis not present

## 2020-08-19 DIAGNOSIS — Z79899 Other long term (current) drug therapy: Secondary | ICD-10-CM | POA: Diagnosis not present

## 2020-08-19 DIAGNOSIS — M19041 Primary osteoarthritis, right hand: Secondary | ICD-10-CM | POA: Diagnosis not present

## 2020-08-19 DIAGNOSIS — M160 Bilateral primary osteoarthritis of hip: Secondary | ICD-10-CM | POA: Diagnosis not present

## 2020-08-19 DIAGNOSIS — Q909 Down syndrome, unspecified: Secondary | ICD-10-CM | POA: Diagnosis not present

## 2020-08-19 DIAGNOSIS — M064 Inflammatory polyarthropathy: Secondary | ICD-10-CM | POA: Diagnosis not present

## 2020-08-19 DIAGNOSIS — M810 Age-related osteoporosis without current pathological fracture: Secondary | ICD-10-CM | POA: Diagnosis not present

## 2020-08-19 DIAGNOSIS — M19042 Primary osteoarthritis, left hand: Secondary | ICD-10-CM | POA: Diagnosis not present

## 2020-08-29 DIAGNOSIS — J4540 Moderate persistent asthma, uncomplicated: Secondary | ICD-10-CM | POA: Diagnosis not present

## 2020-09-03 DIAGNOSIS — H6123 Impacted cerumen, bilateral: Secondary | ICD-10-CM | POA: Diagnosis not present

## 2020-09-03 DIAGNOSIS — H902 Conductive hearing loss, unspecified: Secondary | ICD-10-CM | POA: Diagnosis not present

## 2020-11-03 ENCOUNTER — Other Ambulatory Visit: Payer: Self-pay | Admitting: Internal Medicine

## 2020-11-03 DIAGNOSIS — Z1231 Encounter for screening mammogram for malignant neoplasm of breast: Secondary | ICD-10-CM

## 2020-11-12 DIAGNOSIS — Z23 Encounter for immunization: Secondary | ICD-10-CM | POA: Diagnosis not present

## 2020-11-17 ENCOUNTER — Encounter: Payer: Self-pay | Admitting: Family Medicine

## 2020-11-17 ENCOUNTER — Telehealth: Payer: Medicare Other | Admitting: Adult Health

## 2020-11-17 ENCOUNTER — Telehealth (INDEPENDENT_AMBULATORY_CARE_PROVIDER_SITE_OTHER): Payer: Medicare Other | Admitting: Family Medicine

## 2020-11-17 VITALS — BP 102/68 | HR 67 | Temp 97.8°F | Ht <= 58 in

## 2020-11-17 DIAGNOSIS — L02429 Furuncle of limb, unspecified: Secondary | ICD-10-CM | POA: Diagnosis not present

## 2020-11-17 DIAGNOSIS — N611 Abscess of the breast and nipple: Secondary | ICD-10-CM | POA: Diagnosis not present

## 2020-11-17 MED ORDER — DOXYCYCLINE HYCLATE 100 MG PO TABS: 100.0000 mg | ORAL_TABLET | Freq: Two times a day (BID) | ORAL | 0 refills | Status: AC

## 2020-11-17 NOTE — Progress Notes (Signed)
Kearsten Ginther T. Haruo Stepanek, MD Primary [facility] and [place] [facility] [place], [address] Phone: [phone]  FAX: [place] - 50 y.o. female  MRN [id number]  Date of Birth: 02/06/1971  Visit Date: 11/17/2020  PCP: Crecencio Mc, MD  Referred by: Crecencio Mc, MD  Virtual Visit via Video Note:  I connected with  Cindy Oconnor on 11/17/2020  3:20 PM EDT by a video enabled telemedicine application and verified that I am speaking with the correct person using two identifiers.   Location patient: home computer, tablet, or smartphone Location provider: work or home office Consent: Verbal consent directly obtained from Cindy Oconnor. Persons participating in the virtual visit: patient, provider  I discussed the limitations of evaluation and management by telemedicine and the availability of in person appointments. The patient expressed understanding and agreed to proceed.  Chief Complaint  Patient presents with   Rash    Breast Area    History of Present Illness:  Boil on the L underneath her left breast.  It has been there for about 2 weeks, and it is painful.  It has drained somewhat some bloody liquid as well as some whitish coloration per the patient.  A caregiver is also on the video call with the patient, and she corroborates the story.  Patient has had intermittent boils and abscesses in the past, treated with antibiotics.  She also has a boil on her left leg as well.  She did show me that the boil on her left breast within 2 to 3 seconds of her video call.  Her caregiver was on the call as well.  It has gotten bigger, and it is painful to palpate.   Review of Systems as above: See pertinent positives and pertinent negatives per HPI No acute distress verbally   Observations/Objective/Exam:  An attempt was made to discern vital signs over the phone and per patient if  applicable and possible.   General:    Alert, Oriented, appears well and in no acute distress  Pulmonary:     On inspection no signs of respiratory distress.  Psych / Neurological:     Pleasant and cooperative.  Assessment and Plan:    ICD-10-CM   1. Abscess of left breast  N61.1     2. Boil, leg  L02.429      This is a difficult thing to completely assess over video call.  I do think initially it is probably reasonable to place the patient on some doxycycline and have her do some warm compresses.  If she fails to improve, she may need to have this incised and drained with her primary care doctor.  She can follow-up with their office if needed.  I discussed the assessment and treatment plan with the patient. The patient was provided an opportunity to ask questions and all were answered. The patient agreed with the plan and demonstrated an understanding of the instructions.   The patient was advised to call back or seek an in-person evaluation if the symptoms worsen or if the condition fails to improve as anticipated.   Meds ordered this encounter  Medications   doxycycline (VIBRA-TABS) 100 MG tablet    Sig: Take 1 tablet (100 mg total) by mouth 2 (two) times daily for 10 days.    Dispense:  20 tablet    Refill:  0      Signed,  Frederico Hamman  Celedonio Savage, MD

## 2020-11-27 DIAGNOSIS — B351 Tinea unguium: Secondary | ICD-10-CM | POA: Diagnosis not present

## 2020-11-27 DIAGNOSIS — M79674 Pain in right toe(s): Secondary | ICD-10-CM | POA: Diagnosis not present

## 2020-11-27 DIAGNOSIS — M79675 Pain in left toe(s): Secondary | ICD-10-CM | POA: Diagnosis not present

## 2020-11-28 ENCOUNTER — Ambulatory Visit
Admission: RE | Admit: 2020-11-28 | Discharge: 2020-11-28 | Disposition: A | Discharge status: Home / Self Care | Payer: Medicare Other | Source: Ambulatory Visit | Attending: Internal Medicine | Admitting: Internal Medicine

## 2020-11-28 ENCOUNTER — Other Ambulatory Visit: Payer: Self-pay

## 2020-11-28 DIAGNOSIS — Z1231 Encounter for screening mammogram for malignant neoplasm of breast: Secondary | ICD-10-CM | POA: Diagnosis not present

## 2020-12-07 DIAGNOSIS — Z20822 Contact with and (suspected) exposure to covid-19: Secondary | ICD-10-CM | POA: Diagnosis not present

## 2020-12-31 ENCOUNTER — Ambulatory Visit: Payer: Medicare Other | Admitting: Internal Medicine

## 2021-01-16 ENCOUNTER — Other Ambulatory Visit: Payer: Self-pay | Admitting: Internal Medicine

## 2021-01-30 ENCOUNTER — Telehealth: Payer: Self-pay | Admitting: Internal Medicine

## 2021-01-30 DIAGNOSIS — M1711 Unilateral primary osteoarthritis, right knee: Secondary | ICD-10-CM

## 2021-01-30 DIAGNOSIS — Q909 Down syndrome, unspecified: Secondary | ICD-10-CM

## 2021-01-30 NOTE — Telephone Encounter (Signed)
DME printed and signed. Left message to please call office.

## 2021-01-30 NOTE — Telephone Encounter (Signed)
Patient DPR mother requested [facility] DME for patient.

## 2021-01-30 NOTE — Telephone Encounter (Signed)
Patient DPR (mother ) [place] fro DME to be mailed to [facility] address was mailed same day.

## 2021-02-11 ENCOUNTER — Telehealth: Payer: Self-pay

## 2021-02-11 ENCOUNTER — Telehealth: Payer: Self-pay | Admitting: Internal Medicine

## 2021-02-11 ENCOUNTER — Ambulatory Visit: Payer: Medicare Other | Admitting: Internal Medicine

## 2021-02-11 MED ORDER — LEVALBUTEROL HCL 0.63 MG/3ML IN NEBU: 0.6300 mg | INHALATION_SOLUTION | Freq: Four times a day (QID) | RESPIRATORY_TRACT | 1 refills | Status: DC | PRN

## 2021-02-11 MED ORDER — LEVALBUTEROL HCL 0.63 MG/3ML IN NEBU: 0.6300 mg | INHALATION_SOLUTION | Freq: Four times a day (QID) | RESPIRATORY_TRACT | 11 refills | Status: DC | PRN

## 2021-02-11 NOTE — Telephone Encounter (Signed)
Pharmacy calling back in and states they were able to get this to go through part B.   Nothing further needed.

## 2021-02-11 NOTE — Telephone Encounter (Signed)
Cindy Oconnor is calling in on behalf of the patient to request a refill on the patients medicine that she uses in her nebulizer.Please call her at [phone] to give her an update.

## 2021-02-11 NOTE — Telephone Encounter (Signed)
Levalbuterol sent

## 2021-02-11 NOTE — Telephone Encounter (Signed)
noted 

## 2021-02-11 NOTE — Telephone Encounter (Signed)
Quantity corrected to allow for 3 DOSES/DAY (180 ML)

## 2021-02-11 NOTE — Telephone Encounter (Signed)
Nevermind do not change medication, pharmacy was able to get the levoalbuteral approved.

## 2021-02-11 NOTE — Telephone Encounter (Signed)
[place] called back and stated that pt's insurance does not cover the levoalbuterol ned solution. She stated that the insurance does cover plain albuterol neb solution.

## 2021-02-11 NOTE — Telephone Encounter (Signed)
Pharmacy calling in about medication. Was sent in as:   Dispense Quantity: 3 mL Refills: 1        Sig: Take 3 mLs (0.63 mg total) by nebulization every 6 (six) hours as needed for wheezing or shortness of breath.    They state the quantity is only enough for one nebulizer treatment and a refill for one other treatment. Wanting to know if this is correct or if Patient will be continuing this medication?   If continuing Patient will need a new Rx sent in. Please advise

## 2021-02-16 ENCOUNTER — Other Ambulatory Visit: Payer: Self-pay | Admitting: Internal Medicine

## 2021-02-25 ENCOUNTER — Other Ambulatory Visit: Payer: Self-pay | Admitting: Internal Medicine

## 2021-02-26 ENCOUNTER — Other Ambulatory Visit: Payer: Self-pay

## 2021-02-26 MED ORDER — "BD ECLIPSE SYRINGE 25G X 1"" 3 ML MISC": 10 refills | Status: DC

## 2021-03-03 DIAGNOSIS — Z79899 Other long term (current) drug therapy: Secondary | ICD-10-CM | POA: Diagnosis not present

## 2021-03-03 DIAGNOSIS — M1990 Unspecified osteoarthritis, unspecified site: Secondary | ICD-10-CM | POA: Diagnosis not present

## 2021-03-10 ENCOUNTER — Ambulatory Visit (INDEPENDENT_AMBULATORY_CARE_PROVIDER_SITE_OTHER): Payer: Medicare Other | Admitting: Internal Medicine

## 2021-03-10 ENCOUNTER — Encounter: Payer: Self-pay | Admitting: Internal Medicine

## 2021-03-10 ENCOUNTER — Other Ambulatory Visit: Payer: Self-pay

## 2021-03-10 VITALS — BP 104/64 | HR 82 | Temp 96.1°F | Ht <= 58 in | Wt 151.2 lb

## 2021-03-10 DIAGNOSIS — Z23 Encounter for immunization: Secondary | ICD-10-CM | POA: Diagnosis not present

## 2021-03-10 DIAGNOSIS — R7301 Impaired fasting glucose: Secondary | ICD-10-CM | POA: Diagnosis not present

## 2021-03-10 DIAGNOSIS — J4541 Moderate persistent asthma with (acute) exacerbation: Secondary | ICD-10-CM

## 2021-03-10 DIAGNOSIS — M1990 Unspecified osteoarthritis, unspecified site: Secondary | ICD-10-CM | POA: Diagnosis not present

## 2021-03-10 LAB — POCT GLYCOSYLATED HEMOGLOBIN (HGB A1C): Hemoglobin A1C: 5.1 % (ref 4.0–5.6)

## 2021-03-10 MED ORDER — FAMOTIDINE 20 MG PO TABS: ORAL_TABLET | ORAL | 1 refills | Status: DC

## 2021-03-10 NOTE — Patient Instructions (Signed)
Check with home about taking montelukast (singulair) rrgularly.  If not,  resume daily ASAP  If she is already taking it,  I recommend adding Allegra 180 mg daily (fexofenadine)    We will add an evening dose of famotidine (pepcid) for nighttime reflux

## 2021-03-10 NOTE — Progress Notes (Signed)
Subjective:  Patient ID: Cindy Oconnor, female    DOB: 1970-11-13  Age: 50 y.o. MRN: [id number]  CC: The primary encounter diagnosis was Impaired fasting glucose. Diagnoses of Moderate persistent chronic asthma with acute exacerbation, Need for immunization against influenza, and Chronic inflammatory arthritis were also pertinent to this visit.  HPI Cindy Oconnor presents for 6 month follow up on multiple conditions  Chief Complaint  Patient presents with   Follow-up    6 month follow up     This visit occurred during the SARS-CoV-2 public health emergency.  Safety protocols were in place, including screening questions prior to the visit, additional usage of staff PPE, and extensive cleaning of exam room while observing appropriate contact time as indicated for disinfecting solutions.   1) asthma:  moderate persistent ,  per April 2022 pulmonology notes.  PFTs were reportedly normal not obstructive,  but a  restrictive component was noted and attributed to her obesity and may have masked the pattern.   has been having a dry cough for 2 weeks .  She is   using symbicort as directed,  along with xopenex by neb prn, none recently.  No wheezing,  no shortness of breath.  No fevers.  Has  GERD,  takes omeprazole once daily,  sleeps on a a raised motorized bed. Eats ice cream at night and mother notices increased cough after eating ice cream. She has also been  prescribed singulair, mother not sure if taking.    2) Obesity :  she is gradually losing weight by reducing her portion sizes and frequency of sugary snacks.   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  Outpatient Medications Prior to Visit  Medication Sig Dispense Refill   alendronate (FOSAMAX) 70 MG tablet Take 70 mg by mouth once a week. Take with a full glass of water on an empty stomach.     atorvastatin (LIPITOR) 20 MG tablet Take 1 tablet (20 mg total) by  mouth daily. 90 tablet 3   azelastine (OPTIVAR) 0.05 % ophthalmic solution Apply to eye.     clotrimazole-betamethasone (LOTRISONE) cream APPLY TOPICALLY 2 TIMES PER DAY 45 g 2   cyanocobalamin (,VITAMIN B-12,) 1000 MCG/ML injection Inject 1 mL (1,000 mcg total) into the muscle every 30 (thirty) days. 30th of the Month 1 mL 11   D3-1000 25 MCG (1000 UT) tablet TAKEK 1 TABLET BY MOUTH EVERY DAY FOR SUPPLEMENT 30 tablet 5   docusate sodium (COLACE) 100 MG capsule TAKE 1 CAPSULE BY MOUTH EVERY OTHER DAY 15 capsule 2   fluticasone (FLONASE) 50 MCG/ACT nasal spray Place into the nose.     folic acid (FOLVITE) 1 MG tablet TAKE 1 TABLET BY MOUTH EVERY DAY 30 tablet 2   ketoconazole (NIZORAL) 2 % cream APPLY TOIPICALLY ONCE DAILY 60 g 11   levalbuterol (XOPENEX) 0.63 MG/3ML nebulizer solution Take 3 mLs (0.63 mg total) by nebulization every 6 (six) hours as needed for wheezing or shortness of breath. 180 mL 11   levothyroxine (SYNTHROID) 125 MCG tablet Take 1 tablet (125 mcg total) by mouth daily before breakfast. 90 tablet 0   methotrexate (RHEUMATREX) 2.5 MG tablet TAKE 7 TABLETS (17.5 MG) BY MOUTH ONCE AWEEK ON FRIDAY. 28 tablet 3   montelukast (SINGULAIR) 10 MG tablet Take 1 tablet by mouth daily.     Multiple Vitamins-Minerals (THEREMS-M) TABS TAKE 1 TABLET BY MOUTH EVERY DAY FOR  SUPPLEMENT 90 tablet 1   omeprazole (PRILOSEC) 20 MG capsule Take 20 mg by mouth daily.     predniSONE (DELTASONE) 2.5 MG tablet TAKE 1 TABLET BY MOUTH EACH DAY 30 tablet 11   Probiotic Product (ALIGN) 4 MG CAPS 1 tablet daily for 4 weeks 30 capsule 0   SYMBICORT 160-4.5 MCG/ACT inhaler Inhale into the lungs.     SYRINGE-NEEDLE, DISP, 3 ML (BD ECLIPSE SYRINGE) 25G X 1" 3 ML MISC FOR USE WITH CYANOCOBALAMIN 1 each 10   traMADol (ULTRAM) 50 MG tablet Take 1 tablet (50 mg total) by mouth daily as needed for moderate pain. 30 tablet 5   No facility-administered medications prior to visit.    Review of Systems;  Patient  denies headache, fevers, malaise, unintentional weight loss, skin rash, eye pain, sinus congestion and sinus pain, sore throat, dysphagia,  hemoptysis , cough, dyspnea, wheezing, chest pain, palpitations, orthopnea, edema, abdominal pain, nausea, melena, diarrhea, constipation, flank pain, dysuria, hematuria, urinary  Frequency, nocturia, numbness, tingling, seizures,  Focal weakness, Loss of consciousness,  Tremor, insomnia, depression, anxiety, and suicidal ideation.      Objective:  BP 104/64 (BP Location: Left Arm, Patient Position: Sitting, Cuff Size: Normal)   Pulse 82   Temp (!) 96.1 F (35.6 C) (Temporal)   Ht 4' 8"  (1.422 m)   Wt 151 lb 3.2 oz (68.6 kg)   LMP 04/11/1981   SpO2 97%   BMI 33.90 kg/m   BP Readings from Last 3 Encounters:  03/10/21 104/64  11/17/20 102/68  07/02/20 103/71    Wt Readings from Last 3 Encounters:  03/10/21 151 lb 3.2 oz (68.6 kg)  07/02/20 156 lb 3.2 oz (70.9 kg)  05/26/20 160 lb (72.6 kg)    General appearance: alert, cooperative and appears stated age Ears: normal TM's and external ear canals both ears Throat: lips, mucosa, and tongue normal; teeth and gums normal Neck: no adenopathy, no carotid bruit, supple, symmetrical, trachea midline and thyroid not enlarged, symmetric, no tenderness/mass/nodules Back: symmetric, no curvature. ROM normal. No CVA tenderness. Lungs: clear to auscultation bilaterally Heart: regular rate and rhythm, S1, S2 normal, no murmur, click, rub or gallop Abdomen: soft, non-tender; bowel sounds normal; no masses,  no organomegaly Pulses: 2+ and symmetric Skin: Skin color, texture, turgor normal. No rashes or lesions Lymph nodes: Cervical, supraclavicular, and axillary nodes normal. MSK:  RUE 2nd digit MCP and PIP with tenderness and swelling. Palmar aspect of right hand in the subcutaneous space between first and second digits there is a nodular density possibly over the flexor tendon that is painful. Contracture  noted of her palm. 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, but able to overcome with gripping  Lab Results  Component Value Date   HGBA1C 5.1 03/10/2021   HGBA1C 5.6 08/17/2018   HGBA1C 5.5 12/28/2017    Lab Results  Component Value Date   CREATININE 0.89 05/14/2020   CREATININE 0.66 03/21/2020   CREATININE 0.83 03/20/2020    Lab Results  Component Value Date   WBC 8.0 05/14/2020   HGB 13.5 05/14/2020   HCT 41.1 05/14/2020   PLT 329 05/14/2020   GLUCOSE 125 (H) 05/14/2020   CHOL 174 05/19/2015   TRIG 97.0 05/19/2015   HDL 47.50 05/19/2015   LDLCALC 107 (H) 05/19/2015   ALT 15 05/14/2020   AST 29 05/14/2020   NA 141 05/14/2020   K 4.3 05/14/2020  CL 104 05/14/2020   CREATININE 0.89 05/14/2020   BUN 12 05/14/2020   CO2 29 05/14/2020   TSH 3.26 08/17/2018   INR 0.95 07/28/2017   HGBA1C 5.1 03/10/2021    MM 3D SCREEN BREAST BILATERAL  Result Date: 12/02/2020 CLINICAL DATA:  Screening. EXAM: DIGITAL SCREENING BILATERAL MAMMOGRAM WITH TOMOSYNTHESIS AND CAD TECHNIQUE: Bilateral screening digital craniocaudal and mediolateral oblique mammograms were obtained. Bilateral screening digital breast tomosynthesis was performed. The images were evaluated with computer-aided detection. COMPARISON:  Previous exam(s). ACR Breast Density Category b: There are scattered areas of fibroglandular density. FINDINGS: There are no findings suspicious for malignancy. IMPRESSION: No mammographic evidence of malignancy. A result letter of this screening mammogram will be mailed directly to the patient. RECOMMENDATION: Screening mammogram in one year. (Code:SM-B-01Y) BI-RADS CATEGORY  1: Negative. Electronically Signed   By: Audie Pinto M.D.   On: 12/02/2020 16:11    Assessment & Plan:   Problem List Items Addressed This Visit       Unprioritized   Asthma, chronic    PFT results considered to be masked  by restrictive physiology.   She is losing weight. Continue symbicort.  No evidence of exacerbation as the cause of her cough.  Confirm singuliar  Add fexofenadine and evening dose of famotidine to manage allergies and GERD      Chronic inflammatory arthritis    Managed with prednisone, methotrexate and folic acid.       Other Visit Diagnoses     Impaired fasting glucose    -  Primary   Relevant Orders   POCT HgB A1C (Completed)   Need for immunization against influenza       Relevant Orders   Flu Vaccine QUAD High Dose(Fluad) (Completed)       I spent  40 minutes dedicated to the care of this patient on the date of this encounter to include pre-visit review of her medical history,  most recent imaging studies, Face-to-face time with the patient , and post visit ordering of testing and therapeutics.   Meds ordered this encounter  Medications   famotidine (PEPCID) 20 MG tablet    Sig: One tablet daily with dinner    Dispense:  90 tablet    Refill:  1    There are no discontinued medications.  Follow-up: No follow-ups on file.   Crecencio Mc, MD

## 2021-03-11 NOTE — Assessment & Plan Note (Signed)
Managed with prednisone, methotrexate and folic acid.

## 2021-03-11 NOTE — Assessment & Plan Note (Addendum)
PFT results considered to be masked by restrictive physiology.   She is losing weight. Continue symbicort.  No evidence of exacerbation as the cause of her cough.  Confirm singuliar  Add fexofenadine and evening dose of famotidine to manage allergies and GERD

## 2021-03-20 DIAGNOSIS — M65331 Trigger finger, right middle finger: Secondary | ICD-10-CM | POA: Diagnosis not present

## 2021-03-20 DIAGNOSIS — M1990 Unspecified osteoarthritis, unspecified site: Secondary | ICD-10-CM | POA: Diagnosis not present

## 2021-03-20 DIAGNOSIS — M65321 Trigger finger, right index finger: Secondary | ICD-10-CM | POA: Diagnosis not present

## 2021-03-25 DIAGNOSIS — H6123 Impacted cerumen, bilateral: Secondary | ICD-10-CM | POA: Diagnosis not present

## 2021-03-25 DIAGNOSIS — H902 Conductive hearing loss, unspecified: Secondary | ICD-10-CM | POA: Diagnosis not present

## 2021-03-27 ENCOUNTER — Other Ambulatory Visit: Payer: Self-pay | Admitting: Internal Medicine

## 2021-04-07 DIAGNOSIS — Z23 Encounter for immunization: Secondary | ICD-10-CM | POA: Diagnosis not present

## 2021-04-22 ENCOUNTER — Other Ambulatory Visit: Payer: Self-pay | Admitting: Internal Medicine

## 2021-05-07 DIAGNOSIS — Z20822 Contact with and (suspected) exposure to covid-19: Secondary | ICD-10-CM | POA: Diagnosis not present

## 2021-05-28 ENCOUNTER — Ambulatory Visit (INDEPENDENT_AMBULATORY_CARE_PROVIDER_SITE_OTHER): Payer: Medicare Other

## 2021-05-28 ENCOUNTER — Ambulatory Visit (INDEPENDENT_AMBULATORY_CARE_PROVIDER_SITE_OTHER): Payer: Medicare Other | Admitting: Adult Health

## 2021-05-28 ENCOUNTER — Other Ambulatory Visit: Payer: Self-pay

## 2021-05-28 VITALS — BP 120/70 | HR 87 | Temp 98.2°F | Resp 18 | Wt 146.0 lb

## 2021-05-28 DIAGNOSIS — J029 Acute pharyngitis, unspecified: Secondary | ICD-10-CM

## 2021-05-28 DIAGNOSIS — R051 Acute cough: Secondary | ICD-10-CM | POA: Diagnosis not present

## 2021-05-28 DIAGNOSIS — J069 Acute upper respiratory infection, unspecified: Secondary | ICD-10-CM | POA: Diagnosis not present

## 2021-05-28 DIAGNOSIS — R059 Cough, unspecified: Secondary | ICD-10-CM | POA: Diagnosis not present

## 2021-05-28 DIAGNOSIS — R062 Wheezing: Secondary | ICD-10-CM | POA: Diagnosis not present

## 2021-05-28 LAB — CBC WITH DIFFERENTIAL/PLATELET
Basophils Absolute: 0.1 10*3/uL (ref 0.0–0.1)
Basophils Relative: 0.5 % (ref 0.0–3.0)
Eosinophils Absolute: 0.1 10*3/uL (ref 0.0–0.7)
Eosinophils Relative: 1.3 % (ref 0.0–5.0)
HCT: 38.8 % (ref 36.0–46.0)
Hemoglobin: 12.7 g/dL (ref 12.0–15.0)
Lymphocytes Relative: 8 % — ABNORMAL LOW (ref 12.0–46.0)
Lymphs Abs: 0.9 10*3/uL (ref 0.7–4.0)
MCHC: 32.9 g/dL (ref 30.0–36.0)
MCV: 109 fl — ABNORMAL HIGH (ref 78.0–100.0)
Monocytes Absolute: 0.7 10*3/uL (ref 0.1–1.0)
Monocytes Relative: 6.5 % (ref 3.0–12.0)
Neutro Abs: 8.9 10*3/uL — ABNORMAL HIGH (ref 1.4–7.7)
Neutrophils Relative %: 83.7 % — ABNORMAL HIGH (ref 43.0–77.0)
Platelets: 225 10*3/uL (ref 150.0–400.0)
RBC: 3.56 Mil/uL — ABNORMAL LOW (ref 3.87–5.11)
RDW: 15.6 % — ABNORMAL HIGH (ref 11.5–15.5)
WBC: 10.7 10*3/uL — ABNORMAL HIGH (ref 4.0–10.5)

## 2021-05-28 LAB — COMPREHENSIVE METABOLIC PANEL
ALT: 19 U/L (ref 0–35)
AST: 30 U/L (ref 0–37)
Albumin: 3.6 g/dL (ref 3.5–5.2)
Alkaline Phosphatase: 75 U/L (ref 39–117)
BUN: 11 mg/dL (ref 6–23)
CO2: 31 mEq/L (ref 19–32)
Calcium: 8.7 mg/dL (ref 8.4–10.5)
Chloride: 100 mEq/L (ref 96–112)
Creatinine, Ser: 0.88 mg/dL (ref 0.40–1.20)
GFR: 75.37 mL/min (ref >60.00)
Glucose, Bld: 98 mg/dL (ref 70–99)
Potassium: 4.2 mEq/L (ref 3.5–5.1)
Sodium: 139 mEq/L (ref 135–145)
Total Bilirubin: 0.7 mg/dL (ref 0.2–1.2)
Total Protein: 6.5 g/dL (ref 6.0–8.3)

## 2021-05-28 MED ORDER — BENZONATATE 100 MG PO CAPS: 100.0000 mg | ORAL_CAPSULE | Freq: Two times a day (BID) | ORAL | 0 refills | Status: DC | PRN

## 2021-05-28 MED ORDER — PREDNISONE 10 MG (21) PO TBPK: ORAL_TABLET | ORAL | 0 refills | Status: DC

## 2021-05-28 MED ORDER — AMOXICILLIN-POT CLAVULANATE 875-125 MG PO TABS: 1.0000 | ORAL_TABLET | Freq: Two times a day (BID) | ORAL | 0 refills | Status: DC

## 2021-05-28 NOTE — Progress Notes (Signed)
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  No acute process on x ray. Please keep primary care follow up.

## 2021-05-28 NOTE — Patient Instructions (Addendum)
Cough, Adult A cough helps to clear your throat and lungs. A cough may be a sign of an illness or another medical condition. An acute cough may only last 2-3 weeks, while a chronic cough may last 8 or more weeks. Many things can cause a cough. They include: Germs (viruses or bacteria) that attack the airway. Breathing in things that bother (irritate) your lungs. Allergies. Asthma. Mucus that runs down the back of your throat (postnasal drip). Smoking. Acid backing up from the stomach into the tube that moves food from the mouth to the stomach (gastroesophageal reflux). Some medicines. Lung problems. Other medical conditions, such as heart failure or a blood clot in the lung (pulmonary embolism). Follow these instructions at home: Medicines Take over-the-counter and prescription medicines only as told by your doctor. Talk with your doctor before you take medicines that stop a cough (cough suppressants). Lifestyle  Do not smoke, and try not to be around smoke. Do not use any products that contain nicotine or tobacco, such as cigarettes, e-cigarettes, and chewing tobacco. If you need help quitting, ask your doctor. Drink enough fluid to keep your pee (urine) pale yellow. Avoid caffeine. Do not drink alcohol if your doctor tells you not to drink. General instructions  Watch for any changes in your cough. Tell your doctor about them. Always cover your mouth when you cough. Stay away from things that make you cough, such as perfume, candles, campfire smoke, or cleaning products. If the air is dry, use a cool mist vaporizer or humidifier in your home. If your cough is worse at night, try using extra pillows to raise your head up higher while you sleep. Rest as needed. Keep all follow-up visits as told by your doctor. This is important. Contact a doctor if: You have new symptoms. You cough up pus. Your cough does not get better after 2-3 weeks, or your cough gets worse. Cough medicine  does not help your cough and you are not sleeping well. You have pain that gets worse or pain that is not helped with medicine. You have a fever. You are losing weight and you do not know why. You have night sweats. Get help right away if: You cough up blood. You have trouble breathing. Your heartbeat is very fast. These symptoms may be an emergency. Do not wait to see if the symptoms will go away. Get medical help right away. Call your local emergency services (911 in the [place] Do not drive yourself to the hospital. Summary A cough helps to clear your throat and lungs. Many things can cause a cough. Take over-the-counter and prescription medicines only as told by your doctor. Always cover your mouth when you cough. Contact a doctor if you have new symptoms or you have a cough that does not get better or gets worse. This information is not intended to replace advice given to you by your health care provider. Make sure you discuss any questions you have with your health care provider. Document Revised: 07/06/2019 Document Reviewed: 06/05/2018 Else[employer] Patient Education  [place]. Prednisolone Tablets What is this medication? PREDNISOLONE (pred NISS oh lone) treats many conditions such as asthma, allergic reactions, arthritis, inflammatory bowel diseases, adrenal, and blood or bone marrow disorders. It works by decreasing inflammation, slowing down an overactive immune system, or replacing cortisol normally made in the body. Cortisol is a hormone that plays an important role in how the body responds to stress, illness, and injury. It belongs to a group of  medications called steroids. This medicine may be used for other purposes; ask your health care provider or pharmacist if you have questions. COMMON BRAND NAME(S): Millipred, Millipred DP, Millipred DP 12-Day, Millipred DP 6 Day, Prednoral What should I tell my care team before I take this medication? They need to know if you have  any of these conditions: Cushing's syndrome Diabetes Glaucoma Heart problems or disease High blood pressure Infection such as herpes, measles, tuberculosis, or chickenpox Kidney disease Liver disease Mental problems Myasthenia gravis Osteoporosis Seizures Stomach ulcer or intestine disease including colitis and diverticulitis Thyroid problem An unusual or allergic reaction to lactose, prednisolone, other medications, foods, dyes, or preservatives Pregnant or trying to get pregnant Breast-feeding How should I use this medication? Take this medication by mouth with a glass of water. Follow the directions on the prescription label. Take it with food or milk to avoid stomach upset. If you are taking this medication once a day, take it in the morning. Do not take more medication than you are told to take. Do not suddenly stop taking your medication because you may develop a severe reaction. Your care team will tell you how much medication to take. If your care team wants you to stop the medication, the dose may be slowly lowered over time to avoid any side effects. Talk to your care team about the use of this medication in children. Special care may be needed. Overdosage: If you think you have taken too much of this medicine contact a poison control center or emergency room at once. NOTE: This medicine is only for you. Do not share this medicine with others. What if I miss a dose? If you miss a dose, take it as soon as you can. If it is almost time for your next dose, take only that dose. Do not take double or extra doses. What may interact with this medication? Do not take this medication with any of the following: Metyrapone Mifepristone This medication may also interact with the following: Aminoglutethimide Amphotericin B Aspirin and aspirin-like medications Barbiturates Certain medications for diabetes, like glipizide or glyburide Cholestyramine Cholinesterase  inhibitors Cyclosporine Digoxin Diuretics Ephedrine Female hormones, like estrogens and birth control pills Isoniazid Ketoconazole NSAIDS, medications for pain and inflammation, like ibuprofen or naproxen Phenytoin Rifampin Toxoids Vaccines Warfarin This list may not describe all possible interactions. Give your health care provider a list of all the medicines, herbs, non-prescription drugs, or dietary supplements you use. Also tell them if you smoke, drink alcohol, or use illegal drugs. Some items may interact with your medicine. What should I watch for while using this medication? Visit your care team for regular checks on your progress. If you are taking this medication over a prolonged period, carry an identification card with your name and address, the type and dose of your medication, and your care team's name and address. This medication may increase your risk of getting an infection. Tell your care team if you are around anyone with measles or chickenpox, or if you develop sores or blisters that do not heal properly. If you are going to have surgery, tell your care team that you have taken this medication within the last twelve months. Ask your care team about your diet. You may need to lower the amount of salt you eat. This medication may increase blood sugar. Ask your care team if changes in diet or medications are needed if you have diabetes. What side effects may I notice from receiving this medication? Side  effects that you should report to your care team as soon as possible: Allergic reactions--skin rash, itching, hives, swelling of the face, lips, tongue, or throat Cushing syndrome--increased fat around the midsection, upper back, neck, or face, pink or purple stretch marks on the skin, thinning, fragile skin that easily bruises, unexpected hair growth High blood sugar (hyperglycemia)--increased thirst or amount of urine, unusual weakness or fatigue, blurry vision Increase in  blood pressure Infection--fever, chills, cough, sore throat, wounds that don't heal, pain or trouble when passing urine, general feeling of discomfort or being unwell Low adrenal gland function--nausea, vomiting, loss of appetite, unusual weakness or fatigue, dizziness Mood and behavior changes--anxiety, nervousness, confusion, hallucinations, irritability, hostility, thoughts of suicide or self-harm, worsening mood, feelings of depression Stomach bleeding--bloody or black, tar-like stools, vomiting blood or brown material that looks like coffee grounds Swelling of the ankles, hands, or feet Side effects that usually do not require medical attention (report to your care team if they continue or are bothersome): Acne General discomfort and fatigue Headache Increase in appetite Nausea Trouble sleeping Weight gain This list may not describe all possible side effects. Call your doctor for medical advice about side effects. You may report side effects to FDA at 1-800-FDA-1088. Where should I keep my medication? Keep out of the reach of children. Store at room temperature between 15 and 30 degrees C (59 and 86 degrees F). Keep container tightly closed. Throw away any unused medication after the expiration date. NOTE: This sheet is a summary. It may not cover all possible information. If you have questions about this medicine, talk to your doctor, pharmacist, or health care provider.  2022 Elsevier/Gold Standard (2020-08-15 00:00:00) Benzonatate Capsules What is this medication? BENZONATATE (ben ZOE na tate) is used to relieve cough. It works by calming your cough reflex. It belongs to a group of medications called cough suppressants. This medicine may be used for other purposes; ask your health care provider or pharmacist if you have questions. COMMON BRAND NAME(S): Tessalon Perles, Zonatuss What should I tell my care team before I take this medication? They need to know if you have any of these  conditions: Kidney or liver disease An unusual or allergic reaction to benzonatate, anesthetics, other medications, foods, dyes, or preservatives Pregnant or trying to get pregnant Breast-feeding How should I use this medication? Take this medication by mouth with a glass of water. Follow the directions on the prescription label. Avoid breaking, chewing, or sucking the capsule, as this can cause serious side effects. Take your medication at regular intervals. Do not take your medication more often than directed. Talk to your care team about the use of this medication in children. While this medication may be prescribed for children as young as 63 years old for selected conditions, precautions do apply. Overdosage: If you think you have taken too much of this medicine contact a poison control center or emergency room at once. NOTE: This medicine is only for you. Do not share this medicine with others. What if I miss a dose? If you miss a dose, take it as soon as you can. If it is almost time for your next dose, take only that dose. Do not take double or extra doses. What may interact with this medication? Do not take this medication with any of the following: MAOIs like Carbex, Eldepryl, Marplan, Nardil, and Parnate This list may not describe all possible interactions. Give your health care provider a list of all the medicines, herbs, non-prescription drugs,  or dietary supplements you use. Also tell them if you smoke, drink alcohol, or use illegal drugs. Some items may interact with your medicine. What should I watch for while using this medication? Tell your care team if your symptoms do not improve or if they get worse. If you have a high fever, skin rash, or headache, see your care team. You may get drowsy or dizzy. Do not drive, use machinery, or do anything that needs mental alertness until you know how this medication affects you. Do not sit or stand up quickly, especially if you are an older  patient. This reduces the risk of dizzy or fainting spells. What side effects may I notice from receiving this medication? Side effects that you should report to your care team as soon as possible: Allergic reactions--skin rash, itching, hives, swelling of the face, lips, tongue, or throat Confusion Hallucinations Side effects that usually do not require medical attention (report to your care team if they continue or are bothersome): Burning or tingling of the tongue, mouth, throat, or face Dizziness Drowsiness This list may not describe all possible side effects. Call your doctor for medical advice about side effects. You may report side effects to FDA at 1-800-FDA-1088. Where should I keep my medication? Keep out of the reach of children. Store at room temperature between 15 and 30 degrees C (59 and 86 degrees F). Keep tightly closed. Protect from light and moisture. Throw away any unused medication after the expiration date. NOTE: This sheet is a summary. It may not cover all possible information. If you have questions about this medicine, talk to your doctor, pharmacist, or health care provider.  2022 Elsevier/Gold Standard (2020-08-27 00:00:00) Amoxicillin; Clavulanic Acid Tablets What is this medication? AMOXICILLIN; CLAVULANIC ACID (a mox i SIL in; KLAV yoo lan ic AS id) treats infections caused by bacteria. It belongs to a group of medications called penicillin antibiotics. It will not treat colds, the flu, or infections caused by viruses. This medicine may be used for other purposes; ask your health care provider or pharmacist if you have questions. COMMON BRAND NAME(S): Augmentin What should I tell my care team before I take this medication? They need to know if you have any of these conditions: Kidney disease Liver disease Mononucleosis Stomach or intestine problems such as colitis An unusual or allergic reaction to amoxicillin, other penicillin or cephalosporin antibiotics,  clavulanic acid, other medications, foods, dyes, or preservatives Pregnant or trying to get pregnant Breast-feeding How should I use this medication? Take this medication by mouth. Take it as directed on the prescription label at the same time every day. Take it with food at the start of a meal or snack. Take all of this medication unless your care team tells you to stop it early. Keep taking it even if you think you are better. Talk to your care team about the use of this medication in children. While it may be prescribed for selected conditions, precautions do apply. Overdosage: If you think you have taken too much of this medicine contact a poison control center or emergency room at once. NOTE: This medicine is only for you. Do not share this medicine with others. What if I miss a dose? If you miss a dose, take it as soon as you can. If it is almost time for your next dose, take only that dose. Do not take double or extra doses. What may interact with this medication? Allopurinol Anticoagulants Birth control pills Methotrexate Probenecid This list  may not describe all possible interactions. Give your health care provider a list of all the medicines, herbs, non-prescription drugs, or dietary supplements you use. Also tell them if you smoke, drink alcohol, or use illegal drugs. Some items may interact with your medicine. What should I watch for while using this medication? Tell your care team if your symptoms do not start to get better or if they get worse. This medication may cause serious skin reactions. They can happen weeks to months after starting the medication. Contact your care team right away if you notice fevers or flu-like symptoms with a rash. The rash may be red or purple and then turn into blisters or peeling of the skin. Or, you might notice a red rash with swelling of the face, lips or lymph nodes in your neck or under your arms. Do not treat diarrhea with over the counter  products. Contact your care team if you have diarrhea that lasts more than 2 days or if it is severe and watery. If you have diabetes, you may get a false-positive result for sugar in your urine. Check with your care team. Birth control may not work properly while you are taking this medication. Talk to your care team about using an extra method of birth control. What side effects may I notice from receiving this medication? Side effects that you should report to your care team as soon as possible: Allergic reactions--skin rash, itching, hives, swelling of the face, lips, tongue, or throat Liver injury--right upper belly pain, loss of appetite, nausea, light-colored stool, dark yellow or brown urine, yellowing skin or eyes, unusual weakness or fatigue Redness, blistering, peeling, or loosening of the skin, including inside the mouth Severe diarrhea, fever Unusual vaginal discharge, itching, or odor Side effects that usually do not require medical attention (report to your care team if they continue or are bothersome): Diarrhea Nausea Vomiting This list may not describe all possible side effects. Call your doctor for medical advice about side effects. You may report side effects to FDA at 1-800-FDA-1088. Where should I keep my medication? Keep out of the reach of children and pets. Store at room temperature between 20 and 25 degrees C (68 and 77 degrees F). Throw away any unused medication after the expiration date. NOTE: This sheet is a summary. It may not cover all possible information. If you have questions about this medicine, talk to your doctor, pharmacist, or health care provider.  2022 Elsevier/Gold Standard (2020-05-11 00:00:00)

## 2021-05-28 NOTE — Progress Notes (Signed)
Acute Office Visit  Subjective:    Patient ID: Cindy Oconnor, female    DOB: May 29, 1971, 50 y.o.   MRN: [id number]  Chief Complaint  Patient presents with   Cough   Nasal Congestion        Wheezing   Generalized Body Aches    HPI Patient is in today for cough x 3 day. Increased sputum.  Chest congested. Mild wheezing.  Inhaler has helped wheezing.  Patient  denies any fever, body aches,chills, rash, chest pain, shortness of breath, nausea, vomiting, or diarrhea.   Denies dizziness, lightheadedness, pre syncopal or syncopal episodes.     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 Positive anti-CCP antibodies, negative rheumatoid factor, positive FANA. b. Methotrexate, Prednisone, Plaquenil.    Joint pain    Lupus ([place]) 1995   Osteoarthritis 01/30/2014   a. Lumbar disc disease. b. Trigger nodules   Osteoporosis 01/30/2014   a. [place]   RA (rheumatoid arthritis) (HCC)    Shingles    Ulcerative (chronic) enterocolitis (HCC)    Ulcerative colitis ([place])     Past Surgical History:  Procedure Laterality Date   ABDOMINAL HYSTERECTOMY     ABDOMINAL HYSTERECTOMY W/ PARTIAL VAGINACTOMY  1992   BUNIONECTOMY Bilateral    Bunionectomy great toe   COLONOSCOPY  04/16/2013   Hx of ulcerative colitis - repeat 2 years per Dr. Rayann Heman   COLONOSCOPY  09/24/2003   Dr. Brita Romp   FOOT SURGERY Bilateral    x2   [place] Left 01/10/2017   Procedure:  COMPUTER ASSISTED TOTAL KNEE ARTHROPLASTY;  Surgeon: Dereck Leep, MD;  Location: [facility];  Service: Orthopedics;  Laterality: Left;   KNEE ARTHROPLASTY Right 08/10/2017   Procedure: COMPUTER ASSISTED TOTAL KNEE ARTHROPLASTY;  Surgeon: Dereck Leep, MD;  Location: [facility];  Service: Orthopedics;  Laterality: Right;    Family History  Problem Relation Age of Onset   Heart disease Mother    Diabetes Mother    Breast cancer Mother 52   Heart disease Father    Diabetes Father    Alcohol abuse Maternal Grandmother    Arthritis Maternal Grandmother    Stroke Maternal Grandmother    Diabetes Maternal Grandmother    Alcohol abuse Maternal Grandfather    Arthritis Maternal Grandfather    Stroke Maternal Grandfather    Diabetes Maternal Grandfather     Social History   Socioeconomic History   Marital status: Single    Spouse name: Not on file   Number of children: 0   Years of education: Not on file   Highest education level: Not on file  Occupational History   Not on file  Tobacco Use   Smoking status: Never   Smokeless tobacco: Never  Vaping Use   Vaping Use: Never used  Substance and Sexual Activity   Alcohol use: No   Drug use: No   Sexual activity: Not on file  Other Topics Concern   Not on file  Social History Narrative   Lives at [facility].   Social Determinants of Health   Financial Resource Strain: Not on file  Food Insecurity: Not on file  Transportation Needs: Not on file  Physical Activity: Not on file  Stress: Not on file  Social Connections: Not on file  Intimate Partner Violence: Not on file    Outpatient Medications Prior to Visit  Medication Sig Dispense Refill   alendronate (FOSAMAX) 70 MG tablet Take 70 mg by mouth once a week. Take with a full glass of water on an empty stomach.     atorvastatin (LIPITOR) 20 MG tablet Take 1 tablet (20 mg total) by mouth daily. 90 tablet 3   azelastine (OPTIVAR) 0.05 % ophthalmic  solution PLACE 1 DROP EACH EYE TWICE DAILY *WAIT 3-5 MINUTES BETWEEN 2 EYE MEDS* *STORE UPRIGHT* 6 mL 10   clotrimazole-betamethasone (LOTRISONE) cream APPLY TOPICALLY 2 TIMES PER DAY 45 g 2   cyanocobalamin (,VITAMIN B-12,) 1000 MCG/ML injection INJECT 1ML=1000MCG INTRAMUSCULARLY EVERY MONTH ON THE 30TH 1 mL 11   D3-1000 25 MCG (1000 UT) tablet TAKEK 1 TABLET BY MOUTH EVERY DAY FOR SUPPLEMENT 30 tablet 5   docusate sodium (COLACE) 100 MG capsule TAKE 1 CAPSULE BY MOUTH EVERY OTHER DAY 15 capsule 2   famotidine (PEPCID) 20 MG tablet One tablet daily with dinner 90 tablet 1   folic acid (FOLVITE) 1 MG tablet TAKE 1 TABLET BY MOUTH EVERY DAY 30 tablet 2   ketoconazole (NIZORAL) 2 % cream APPLY TOIPICALLY ONCE DAILY 60 g 11   levalbuterol (XOPENEX) 0.63 MG/3ML nebulizer solution Take 3 mLs (0.63 mg total) by nebulization every 6 (six) hours as needed for wheezing or shortness of breath. 180 mL 11   levothyroxine (SYNTHROID) 125 MCG tablet Take 1 tablet (125 mcg total) by mouth daily before breakfast. 90 tablet 0   methotrexate (RHEUMATREX) 2.5 MG tablet TAKE 7 TABLETS (17.5 MG) BY MOUTH ONCE AWEEK ON FRIDAY. 28 tablet 3   montelukast (SINGULAIR) 10 MG tablet Take 1 tablet by mouth daily.     Multiple Vitamins-Minerals (THEREMS-M) TABS TAKE 1 TABLET BY MOUTH EVERY DAY FOR SUPPLEMENT 90 tablet 1   omeprazole (PRILOSEC) 20 MG capsule Take 20 mg by mouth daily.     predniSONE (DELTASONE) 2.5 MG tablet TAKE 1 TABLET BY MOUTH EACH DAY 30 tablet 11   Probiotic Product (ALIGN) 4 MG CAPS 1 tablet daily for 4 weeks 30 capsule 0   SYMBICORT 160-4.5 MCG/ACT inhaler Inhale into the lungs.     SYRINGE-NEEDLE, DISP, 3 ML (BD ECLIPSE SYRINGE) 25G X 1" 3 ML MISC FOR USE WITH CYANOCOBALAMIN 1 each 10   traMADol (ULTRAM) 50 MG tablet Take 1 tablet (50 mg total) by mouth daily as needed for moderate pain. 30 tablet 5   fluticasone (FLONASE) 50 MCG/ACT nasal spray Place into the nose.     No facility-administered  medications prior to visit.    Allergies  Allergen Reactions   Erythromycin Shortness Of Breath, Rash and Other (See Comments)    Reaction:  Unknown     Oxycontin [Oxycodone]    Tramadol Nausea And Vomiting   Albuterol Rash and Other (See Comments)    "Jittery"     Review of Systems  Constitutional:  Positive for fatigue. Negative  for activity change, appetite change, chills, diaphoresis, fever and unexpected weight change.  HENT: Negative.    Respiratory:  Positive for cough and wheezing. Negative for apnea, choking, chest tightness, shortness of breath and stridor.   Cardiovascular: Negative.   Gastrointestinal: Negative.   Genitourinary: Negative.   Musculoskeletal: Negative.   Neurological: Negative.   Hematological: Negative.   Psychiatric/Behavioral: Negative.        Objective:    Physical Exam Vitals reviewed.  Constitutional:      General: She is not in acute distress.    Appearance: She is well-developed. She is not ill-appearing, toxic-appearing or diaphoretic.     Interventions: She is not intubated. HENT:     Head: Normocephalic and atraumatic.     Right Ear: External ear normal.     Left Ear: External ear normal.     Nose: Nose normal.     Mouth/Throat:     Pharynx: No oropharyngeal exudate.  Eyes:     General: Lids are normal. No scleral icterus.       Right eye: No discharge.        Left eye: No discharge.     Conjunctiva/sclera: Conjunctivae normal.     Right eye: Right conjunctiva is not injected. No exudate or hemorrhage.    Left eye: Left conjunctiva is not injected. No exudate or hemorrhage.    Pupils: Pupils are equal, round, and reactive to light.  Neck:     Thyroid: No thyroid mass or thyromegaly.     Vascular: Normal carotid pulses. No carotid bruit, hepatojugular reflux or JVD.     Trachea: Trachea and phonation normal. No tracheal tenderness or tracheal deviation.     Meningeal: Brudzinski's sign and Kernig's sign absent.   Cardiovascular:     Rate and Rhythm: Normal rate and regular rhythm.     Pulses: Normal pulses.          Radial pulses are 2+ on the right side and 2+ on the left side.       Dorsalis pedis pulses are 2+ on the right side and 2+ on the left side.       Posterior tibial pulses are 2+ on the right side and 2+ on the left side.     Heart sounds: Normal heart sounds, S1 normal and S2 normal. Heart sounds not distant. No murmur heard.   No friction rub. No gallop.  Pulmonary:     Effort: Pulmonary effort is normal. No tachypnea, bradypnea, accessory muscle usage or respiratory distress. She is not intubated.     Breath sounds: No stridor. Examination of the right-upper field reveals wheezing. Examination of the left-upper field reveals wheezing. Wheezing present. No rhonchi or rales.     Comments: Mild expiratory wheezing  Chest:     Chest wall: No tenderness.  Abdominal:     General: Bowel sounds are normal. There is no distension or abdominal bruit.     Palpations: Abdomen is soft. There is no shifting dullness, fluid wave, hepatomegaly, splenomegaly, mass or pulsatile mass.     Tenderness: There is no abdominal tenderness. There is no right CVA tenderness, left CVA tenderness, guarding or rebound.     Hernia: No hernia is present.  Musculoskeletal:        General: No tenderness or deformity. Normal range of motion.     Cervical back: Full passive range of motion without pain, normal range of motion and neck supple. No edema, erythema or rigidity. No spinous process tenderness  or muscular tenderness. Normal range of motion.  Lymphadenopathy:     Head:     Right side of head: No submental, submandibular, tonsillar, preauricular, posterior auricular or occipital adenopathy.     Left side of head: No submental, submandibular, tonsillar, preauricular, posterior auricular or occipital adenopathy.     Cervical: No cervical adenopathy.     Right cervical: No superficial, deep or posterior cervical  adenopathy.    Left cervical: No superficial, deep or posterior cervical adenopathy.     Upper Body:     Right upper body: No supraclavicular or pectoral adenopathy.     Left upper body: No supraclavicular or pectoral adenopathy.  Skin:    General: Skin is warm and dry.     Coloration: Skin is not pale.     Findings: No abrasion, bruising, burn, ecchymosis, erythema, lesion, petechiae or rash.     Nails: There is no clubbing.  Neurological:     Mental Status: She is alert and oriented to person, place, and time.     GCS: GCS eye subscore is 4. GCS verbal subscore is 5. GCS motor subscore is 6.     Cranial Nerves: No cranial nerve deficit.     Sensory: No sensory deficit.     Motor: No tremor, atrophy, abnormal muscle tone or seizure activity.     Coordination: Coordination normal.     Gait: Gait normal.     Deep Tendon Reflexes: Reflexes are normal and symmetric. Reflexes normal. Babinski sign absent on the right side. Babinski sign absent on the left side.     Reflex Scores:      Tricep reflexes are 2+ on the right side and 2+ on the left side.      Bicep reflexes are 2+ on the right side and 2+ on the left side.      Brachioradialis reflexes are 2+ on the right side and 2+ on the left side.      Patellar reflexes are 2+ on the right side and 2+ on the left side.      Achilles reflexes are 2+ on the right side and 2+ on the left side. Psychiatric:        Speech: Speech normal.        Behavior: Behavior normal.        Thought Content: Thought content normal.        Judgment: Judgment normal.    BP 120/70    Pulse 87    Temp 98.2 F (36.8 C)    Resp 18    Wt 146 lb (66.2 kg)    LMP 04/11/1981    SpO2 97%    BMI 32.73 kg/m  Wt Readings from Last 3 Encounters:  05/28/21 146 lb (66.2 kg)  03/10/21 151 lb 3.2 oz (68.6 kg)  07/02/20 156 lb 3.2 oz (70.9 kg)    Health Maintenance Due  Topic Date Due   Hepatitis C Screening  Never done   Zoster Vaccines- Shingrix (1 of 2) Never  done   Pneumococcal Vaccine 53-62 Years old (3 - PCV) 06/07/2017   COVID-19 Vaccine (5 - Booster for Moderna series) 10/14/2020    There are no preventive care reminders to display for this patient.   Lab Results  Component Value Date   TSH 3.26 08/17/2018   Lab Results  Component Value Date   WBC 10.7 (H) 05/28/2021   HGB 12.7 05/28/2021   HCT 38.8 05/28/2021   MCV 109.0 (H) 05/28/2021  PLT 225.0 05/28/2021   Lab Results  Component Value Date   NA 139 05/28/2021   K 4.2 05/28/2021   CO2 31 05/28/2021   GLUCOSE 98 05/28/2021   BUN 11 05/28/2021   CREATININE 0.88 05/28/2021   BILITOT 0.7 05/28/2021   ALKPHOS 75 05/28/2021   AST 30 05/28/2021   ALT 19 05/28/2021   PROT 6.5 05/28/2021   ALBUMIN 3.6 05/28/2021   CALCIUM 8.7 05/28/2021   ANIONGAP 8 05/14/2020   GFR 75.37 05/28/2021   Lab Results  Component Value Date   CHOL 174 05/19/2015   Lab Results  Component Value Date   HDL 47.50 05/19/2015   Lab Results  Component Value Date   LDLCALC 107 (H) 05/19/2015   Lab Results  Component Value Date   TRIG 97.0 05/19/2015   Lab Results  Component Value Date   CHOLHDL 4 05/19/2015   Lab Results  Component Value Date   HGBA1C 5.1 03/10/2021       Assessment & Plan:   Problem List Items Addressed This Visit       Respiratory   Viral upper respiratory tract infection   Relevant Orders   COVID-19, Flu A+B and RSV (Completed)     Other   Acute cough - Primary   Relevant Medications   amoxicillin-clavulanate (AUGMENTIN) 875-125 MG tablet   predniSONE (STERAPRED UNI-PAK 21 TAB) 10 MG (21) TBPK tablet   benzonatate (TESSALON) 100 MG capsule   Other Relevant Orders   POCT Influenza A/B   POC COVID-19   COVID-19, Flu A+B and RSV (Completed)   CBC with Differential/Platelet (Completed)   Comprehensive metabolic panel (Completed)   DG Chest 2 View (Completed)   Other Visit Diagnoses     Sore throat       Relevant Orders   COVID-19, Flu A+B and  RSV (Completed)   Culture, Group A Strep (Completed)        Meds ordered this encounter  Medications   amoxicillin-clavulanate (AUGMENTIN) 875-125 MG tablet    Sig: Take 1 tablet by mouth 2 (two) times daily.    Dispense:  20 tablet    Refill:  0   predniSONE (STERAPRED UNI-PAK 21 TAB) 10 MG (21) TBPK tablet    Sig: PO: Take 6 tablets on day 1:Take 5 tablets day 2:Take 4 tablets day 3: Take 3 tablets day 4:Take 2 tablets day five: 5 Take 1 tablet day 6    Dispense:  21 tablet    Refill:  0   benzonatate (TESSALON) 100 MG capsule    Sig: Take 1 capsule (100 mg total) by mouth 2 (two) times daily as needed for cough.    Dispense:  20 capsule    Refill:  0   Return in about 1 week (around 06/04/2021), or if symptoms worsen or fail to improve, for at any time for any worsening symptoms, Go to Emergency room/ urgent care if worse.   Marcille Buffy, FNP

## 2021-05-29 LAB — COVID-19, FLU A+B AND RSV
Influenza A, NAA: NOT DETECTED
Influenza B, NAA: NOT DETECTED
RSV, NAA: NOT DETECTED
SARS-CoV-2, NAA: NOT DETECTED

## 2021-05-29 NOTE — Progress Notes (Signed)
Noted. Yes labs that day will be good.

## 2021-05-29 NOTE — Progress Notes (Signed)
Influenza, covid ,and influenza negative.

## 2021-05-29 NOTE — Progress Notes (Signed)
Mildly elevated white blood cell count, patient is on Augmentin at this time, chest x-ray was negative for pneumonia.  POCT test was negative for COVID and flu, did send off strep and COVID RSV flu swab to the lab as well.  MCV is elevated, lets add on a B12 level with her repeat CBC need to repeat her CBC in about 5 days.  Please schedule this for appropriate day next week. She should have a follow-up in the office schedule next week as well.  CMP is within normal limits electrolytes are within normal.  Follow treatment plan from office  if not improving or any worsening within 72 hours and also return to office or open medical facility at ANYTIME if any symptoms persist, change, or worsen or you have any further concerns or questions. Call 911 immediately for emergencies.

## 2021-05-30 LAB — CULTURE, GROUP A STREP
MICRO NUMBER:: 12808800
SPECIMEN QUALITY:: ADEQUATE

## 2021-05-30 NOTE — Progress Notes (Signed)
Strep negative.

## 2021-05-31 ENCOUNTER — Encounter: Payer: Self-pay | Admitting: Adult Health

## 2021-05-31 LAB — POCT INFLUENZA A/B
Influenza A, POC: NEGATIVE — NL
Influenza B, POC: NEGATIVE — NL

## 2021-05-31 LAB — POC COVID19 BINAXNOW: SARS Coronavirus 2 Ag: NEGATIVE

## 2021-06-02 ENCOUNTER — Telehealth: Payer: Self-pay | Admitting: Internal Medicine

## 2021-06-02 NOTE — Telephone Encounter (Signed)
Spoke with pt's mother and she stated that since talking to access nurse this morning the pt has stopped coughing as much. Mother stated that she went and got pt's nebulizer machine from the group home and that seems to be helping the pt. Mother also stated that the pt was able to sleep through the night last night but is wondering if pt may have a broken rib from coughing so much. Mother stated that she thinks pt will be fine until her follow up appt on Thursday but stated that if she gets worse she will call us back or take her on to UC.

## 2021-06-02 NOTE — Telephone Encounter (Signed)
Noted, glad she is feeling better. Advise them to check pulse oximetry if low or any worsening symptoms , she needs to be sooner.  Keep follow up for Thursday. Add mucinex per package directions if she is able   Follow treatment plan from office  if not improving or any worsening within 72 hours and also return to office or open medical facility at ANYTIME if any symptoms persist, change, or worsen or you have any further concerns or questions. Call 911 immediately for emergencies.   Thank you,  Laverna Peace MSN, AGNP-C, FNP-C  Family Nurse Practitioner  Adult Geriatric Nurse Practitioner

## 2021-06-02 NOTE — Telephone Encounter (Signed)
LMTCB

## 2021-06-02 NOTE — Telephone Encounter (Signed)
Pt mom called stating that pt has bad cough and chest pain. Pt mom said that pt had an appt on last Thursday and she also has an appt on this Thursday. Pt mom did not know if she should wait on the upcoming appointment to address issue. I asked mom was it the same symptoms from last appt. Pt mom said yes. I sent pt mom to access nurse

## 2021-06-03 NOTE — Telephone Encounter (Signed)
Spoke with pt's mother and informed her of the message below. Mother gave a verbal understanding and stated that they would be here tomorrow.

## 2021-06-04 ENCOUNTER — Ambulatory Visit (INDEPENDENT_AMBULATORY_CARE_PROVIDER_SITE_OTHER): Payer: Medicare Other

## 2021-06-04 ENCOUNTER — Encounter: Payer: Self-pay | Admitting: Adult Health

## 2021-06-04 ENCOUNTER — Other Ambulatory Visit: Payer: Self-pay

## 2021-06-04 ENCOUNTER — Ambulatory Visit (INDEPENDENT_AMBULATORY_CARE_PROVIDER_SITE_OTHER): Payer: Medicare Other | Admitting: Adult Health

## 2021-06-04 VITALS — BP 110/70 | HR 64 | Ht <= 58 in | Wt 144.8 lb

## 2021-06-04 DIAGNOSIS — R051 Acute cough: Secondary | ICD-10-CM

## 2021-06-04 DIAGNOSIS — R062 Wheezing: Secondary | ICD-10-CM | POA: Diagnosis not present

## 2021-06-04 DIAGNOSIS — D72819 Decreased white blood cell count, unspecified: Secondary | ICD-10-CM | POA: Diagnosis not present

## 2021-06-04 DIAGNOSIS — E039 Hypothyroidism, unspecified: Secondary | ICD-10-CM | POA: Diagnosis not present

## 2021-06-04 DIAGNOSIS — J40 Bronchitis, not specified as acute or chronic: Secondary | ICD-10-CM

## 2021-06-04 DIAGNOSIS — J069 Acute upper respiratory infection, unspecified: Secondary | ICD-10-CM | POA: Diagnosis not present

## 2021-06-04 DIAGNOSIS — R918 Other nonspecific abnormal finding of lung field: Secondary | ICD-10-CM | POA: Diagnosis not present

## 2021-06-04 DIAGNOSIS — D72829 Elevated white blood cell count, unspecified: Secondary | ICD-10-CM | POA: Diagnosis not present

## 2021-06-04 LAB — CBC WITH DIFFERENTIAL/PLATELET
Basophils Absolute: 0 10*3/uL (ref 0.0–0.1)
Basophils Relative: 0.3 % (ref 0.0–3.0)
Eosinophils Absolute: 0.1 10*3/uL (ref 0.0–0.7)
Eosinophils Relative: 0.6 % (ref 0.0–5.0)
HCT: 40.9 % (ref 36.0–46.0)
Hemoglobin: 13.5 g/dL (ref 12.0–15.0)
Lymphocytes Relative: 10.4 % — ABNORMAL LOW (ref 12.0–46.0)
Lymphs Abs: 1.3 10*3/uL (ref 0.7–4.0)
MCHC: 33 g/dL (ref 30.0–36.0)
MCV: 107.9 fl — ABNORMAL HIGH (ref 78.0–100.0)
Monocytes Absolute: 0.8 10*3/uL (ref 0.1–1.0)
Monocytes Relative: 6.8 % (ref 3.0–12.0)
Neutro Abs: 9.9 10*3/uL — ABNORMAL HIGH (ref 1.4–7.7)
Neutrophils Relative %: 81.9 % — ABNORMAL HIGH (ref 43.0–77.0)
Platelets: 386 10*3/uL (ref 150.0–400.0)
RBC: 3.79 Mil/uL — ABNORMAL LOW (ref 3.87–5.11)
RDW: 15.8 % — ABNORMAL HIGH (ref 11.5–15.5)
WBC: 12.1 10*3/uL — ABNORMAL HIGH (ref 4.0–10.5)

## 2021-06-04 LAB — COMPREHENSIVE METABOLIC PANEL
ALT: 23 U/L (ref 0–35)
AST: 25 U/L (ref 0–37)
Albumin: 3.4 g/dL — ABNORMAL LOW (ref 3.5–5.2)
Alkaline Phosphatase: 75 U/L (ref 39–117)
BUN: 17 mg/dL (ref 6–23)
CO2: 36 mEq/L — ABNORMAL HIGH (ref 19–32)
Calcium: 8.4 mg/dL (ref 8.4–10.5)
Chloride: 104 mEq/L (ref 96–112)
Creatinine, Ser: 1.04 mg/dL (ref 0.40–1.20)
GFR: 61.67 mL/min (ref >60.00)
Glucose, Bld: 81 mg/dL (ref 70–99)
Potassium: 4.3 mEq/L (ref 3.5–5.1)
Sodium: 146 mEq/L — ABNORMAL HIGH (ref 135–145)
Total Bilirubin: 0.4 mg/dL (ref 0.2–1.2)
Total Protein: 6.2 g/dL (ref 6.0–8.3)

## 2021-06-04 LAB — TSH: TSH: 0.55 u[IU]/mL (ref 0.35–5.50)

## 2021-06-04 MED ORDER — DM-GUAIFENESIN ER 30-600 MG PO TB12
1.0000 | ORAL_TABLET | Freq: Two times a day (BID) | ORAL | 0 refills | Status: DC | PRN
Start: 2021-06-04 — End: 2021-06-10

## 2021-06-04 MED ORDER — TRIAMCINOLONE ACETONIDE 40 MG/ML IJ SUSP
60.0000 mg | Freq: Once | INTRAMUSCULAR | Status: AC
  Administered 2021-06-04: 60 mg via INTRAMUSCULAR

## 2021-06-04 MED ORDER — PREDNISONE 10 MG (21) PO TBPK: ORAL_TABLET | ORAL | 0 refills | Status: DC

## 2021-06-04 NOTE — Progress Notes (Signed)
Chest x ray is within normal limits. Will wait on CBC. Continue current treatment plan.

## 2021-06-04 NOTE — Patient Instructions (Addendum)
Complete Augmentin it is ok to break tablets in half to swallow, continue nebulizer treatments at home.  Steroid in the office today and sending in repeat prednisone dose pack to start on 06/05/21. Chest x ray and labs today. Chest x ray closed in office today go to [facility] in [place], [place] Address: [address], [place], [place] [address] Phone: [phone]    Meds ordered this encounter  Medications   triamcinolone acetonide (KENALOG-40) injection 60 mg   predniSONE (STERAPRED UNI-PAK 21 TAB) 10 MG (21) TBPK tablet    Sig: PO: Take 6 tablets on day 1:Take 5 tablets day 2:Take 4 tablets day 3: Take 3 tablets day 4:Take 2 tablets day five: 5 Take 1 tablet day 6    Dispense:  21 tablet    Refill:  0   dextromethorphan-guaiFENesin (MUCINEX DM) 30-600 MG 12hr tablet    Sig: Take 1 tablet by mouth 2 (two) times daily as needed for cough.    Dispense:  20 tablet    Refill:  0    Hold daily dose of prednisone while on dose pack.  Medications Discontinued During This Encounter  Medication Reason   predniSONE (STERAPRED UNI-PAK 21 TAB) 10 MG (21) TBPK tablet Completed Course   benzonatate (TESSALON) 100 MG capsule Completed Course     Bronchospasm, Adult Bronchospasm is when the small airways in the lungs narrow. This can make it very hard to breathe. Swelling and more mucus than normal can add to this problem. What are the causes? Having a cold. Exercise. The smell from sprays, perfumes, candles, and cleaners. Cold air. Stress or strong feelings such as laughing or crying. What increases the risk? Having asthma. Smoking. Being around someone who smokes (secondhand smoke). Having allergies. Being allergic to certain foods, medicine, or bug bites or stings. What are the signs or symptoms? Making a high-pitched whistling sound when you breathe, most often when you  breathe out (wheezing). Coughing. A tight feeling in your chest. Feeling like you cannot catch your breath. Feeling like you have no energy to exercise. Breathing that is noisy. A cough that has a high pitch. How is this treated?  Using medicines that you breathe in (inhale). These open up the airways and help you breathe. Medicines can be taken with a metered dose inhaler or a nebulizer device. Taking medicines to reduce swelling. Getting rid of what started the bronchospasm. Follow these instructions at home: Medicines Take over-the-counter and prescription medicines only as told by your doctor. If you need to use an inhaler or nebulizer to take your medicine, ask your doctor how to use it. You may be given a spacer to use with your inhaler. This makes it easier to get the medicine from the inhaler into your lungs. Lifestyle Do not smoke or use any products that contain nicotine or tobacco. If you need help quitting, ask your doctor. Keep track of things that start your bronchospasm. Avoid these if you can. When pollen, air pollution, or humidity is bad, keep windows closed. Use an air conditioner if you have one. Find ways to cope with stress and your feelings. Activity Some people have bronchospasm when they exercise. This is called exercise-induced bronchoconstriction (EIB). If you have this problem, talk with your doctor about how to deal with EIB. Some tips include: Use your inhaler before exercise. Exercise indoors if it is very cold or humid, or if  the pollen and mold counts are high. Warm up and cool down before and after exercise. Stop your exercise right away if your symptoms start or get worse. General instructions If you have asthma, make sure you have an asthma action plan. Stay up to date on your shots (immunizations). Keep all follow-up visits. Get help right away if: You have trouble breathing. You wheeze and cough and this does not get better after you take  medicine. You have chest pain. You have trouble speaking more than one word in a sentence. These symptoms may be an emergency. Get help right away. Call 911. Do not wait to see if the symptoms will go away. Do not drive yourself to the hospital. Summary Bronchospasm is when the small airways in the lungs narrow. Swelling and more mucus than normal can add to this problem. This can make it very hard to breathe. Do not smoke or use any products that contain nicotine or tobacco. If you need help quitting, ask your doctor. Get help right away if you wheeze and cough and this does not get better after you take medicine. This information is not intended to replace advice given to you by your health care provider. Make sure you discuss any questions you have with your health care provider. Document Revised: 12/08/2020 Document Reviewed: 12/08/2020 Elsevier Patient Education  [place].  Cough, Adult A cough helps to clear your throat and lungs. A cough may be a sign of an illness or another medical condition. An acute cough may only last 2-3 weeks, while a chronic cough may last 8 or more weeks. Many things can cause a cough. They include: Germs (viruses or bacteria) that attack the airway. Breathing in things that bother (irritate) your lungs. Allergies. Asthma. Mucus that runs down the back of your throat (postnasal drip). Smoking. Acid backing up from the stomach into the tube that moves food from the mouth to the stomach (gastroesophageal reflux). Some medicines. Lung problems. Other medical conditions, such as heart failure or a blood clot in the lung (pulmonary embolism). Follow these instructions at home: Medicines Take over-the-counter and prescription medicines only as told by your doctor. Talk with your doctor before you take medicines that stop a cough (cough suppressants). Lifestyle  Do not smoke, and try not to be around smoke. Do not use any products that contain  nicotine or tobacco, such as cigarettes, e-cigarettes, and chewing tobacco. If you need help quitting, ask your doctor. Drink enough fluid to keep your pee (urine) pale yellow. Avoid caffeine. Do not drink alcohol if your doctor tells you not to drink. General instructions  Watch for any changes in your cough. Tell your doctor about them. Always cover your mouth when you cough. Stay away from things that make you cough, such as perfume, candles, campfire smoke, or cleaning products. If the air is dry, use a cool mist vaporizer or humidifier in your home. If your cough is worse at night, try using extra pillows to raise your head up higher while you sleep. Rest as needed. Keep all follow-up visits as told by your doctor. This is important. Contact a doctor if: You have new symptoms. You cough up pus. Your cough does not get better after 2-3 weeks, or your cough gets worse. Cough medicine does not help your cough and you are not sleeping well. You have pain that gets worse or pain that is not helped with medicine. You have a fever. You are losing weight and you  do not know why. You have night sweats. Get help right away if: You cough up blood. You have trouble breathing. Your heartbeat is very fast. These symptoms may be an emergency. Do not wait to see if the symptoms will go away. Get medical help right away. Call your local emergency services (911 in the [place] Do not drive yourself to the hospital. Summary A cough helps to clear your throat and lungs. Many things can cause a cough. Take over-the-counter and prescription medicines only as told by your doctor. Always cover your mouth when you cough. Contact a doctor if you have new symptoms or you have a cough that does not get better or gets worse. This information is not intended to replace advice given to you by your health care provider. Make sure you discuss any questions you have with your health care provider. Document  Revised: 07/06/2019 Document Reviewed: 06/05/2018 Elsevier Patient Education  [place].

## 2021-06-04 NOTE — Progress Notes (Addendum)
Acute Office Visit  Subjective:    Patient ID: Cindy Oconnor, female    DOB: [birth year], 51 y.o.   MRN: [id number]  Chief Complaint  Patient presents with   Follow-up    HPI Patient is in today for follow up on cough seen last 05/28/21 that started on 05/25/21. Had mild wheezing. Cough.  Treated with Augmentin and prednisone taper. Benzonatate for cough. Still on Augmentin.  PCR  for Covid flu RSV and strep was negative.  WBC was elevated and advised recheck per lab note.   Mom with her. Cough has loosened. She is is using nebulizer xopenex at home, she brought her home from the group home to care for her. Bronchospasm and wheezing denies any back pain, abdominal pain. Productive cough, she has not seen if any color per mom.   Patient  denies any fever, body aches,chills, rash, chest pain,  nausea, vomiting, or diarrhea.  Denies dizziness, lightheadedness, pre syncopal or syncopal episodes.    Study Result  Narrative & Impression  CLINICAL DATA:  51 year old female with cough and wheezing   EXAM: CHEST - 2 VIEW   COMPARISON:  03/19/2020, CT 03/19/2020   FINDINGS: Cardiomediastinal silhouette unchanged in size and contour. No evidence of central vascular congestion. No interlobular septal thickening.   Opacity at the left posterior lung base corresponds to extrapleural fat on the prior CT.   No pneumothorax or pleural effusion. Coarsened interstitial markings, with no confluent airspace disease.   No acute displaced fracture. Degenerative changes of the spine.   IMPRESSION: No radiographic evidence of acute cardiopulmonary disease     Electronically Signed   By: Corrie Mckusick D.O.   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 Positive anti-CCP antibodies, negative rheumatoid factor, positive FANA. b. Methotrexate, Prednisone, Plaquenil.    Joint pain    Lupus ([place]) 1995   Osteoarthritis 01/30/2014   a. Lumbar disc disease. b. Trigger nodules   Osteoporosis 01/30/2014   a. [place]   RA (rheumatoid arthritis) (HCC)    Shingles    Ulcerative (chronic) enterocolitis (HCC)    Ulcerative colitis ([place])     Past Surgical History:  Procedure Laterality Date   ABDOMINAL HYSTERECTOMY     ABDOMINAL HYSTERECTOMY W/ PARTIAL VAGINACTOMY  1992   BUNIONECTOMY Bilateral    Bunionectomy great toe   COLONOSCOPY  04/16/2013   Hx of ulcerative colitis - repeat 2 years per Dr. Rayann Heman   COLONOSCOPY  09/24/2003   Dr. Brita Romp   FOOT SURGERY Bilateral    x2   [place] Left 01/10/2017   Procedure: COMPUTER ASSISTED TOTAL KNEE ARTHROPLASTY;  Surgeon: Dereck Leep, MD;  Location: [facility];  Service: Orthopedics;  Laterality: Left;   KNEE ARTHROPLASTY Right 08/10/2017  Procedure: COMPUTER ASSISTED TOTAL KNEE ARTHROPLASTY;  Surgeon: Dereck Leep, MD;  Location: [facility];  Service: Orthopedics;  Laterality: Right;    Family History  Problem Relation Age of Onset   Heart disease Mother    Diabetes Mother    Breast cancer Mother 15   Heart disease Father    Diabetes Father    Alcohol abuse Maternal Grandmother    Arthritis Maternal Grandmother    Stroke Maternal Grandmother    Diabetes Maternal Grandmother    Alcohol abuse Maternal Grandfather    Arthritis Maternal Grandfather    Stroke Maternal Grandfather    Diabetes Maternal Grandfather     Social History   Socioeconomic History    Marital status: Single    Spouse name: Not on file   Number of children: 0   Years of education: Not on file   Highest education level: Not on file  Occupational History   Not on file  Tobacco Use   Smoking status: Never   Smokeless tobacco: Never  Vaping Use   Vaping Use: Never used  Substance and Sexual Activity   Alcohol use: No   Drug use: No   Sexual activity: Not on file  Other Topics Concern   Not on file  Social History Narrative   Lives at [facility] group home.   Social Determinants of Health   Financial Resource Strain: Not on file  Food Insecurity: Not on file  Transportation Needs: Not on file  Physical Activity: Not on file  Stress: Not on file  Social Connections: Not on file  Intimate Partner Violence: Not on file    Outpatient Medications Prior to Visit  Medication Sig Dispense Refill   alendronate (FOSAMAX) 70 MG tablet Take 70 mg by mouth once a week. Take with a full glass of water on an empty stomach.     amoxicillin-clavulanate (AUGMENTIN) 875-125 MG tablet Take 1 tablet by mouth 2 (two) times daily. 20 tablet 0   atorvastatin (LIPITOR) 20 MG tablet Take 1 tablet (20 mg total) by mouth daily. 90 tablet 3   azelastine (OPTIVAR) 0.05 % ophthalmic solution PLACE 1 DROP EACH EYE TWICE DAILY *WAIT 3-5 MINUTES BETWEEN 2 EYE MEDS* *STORE UPRIGHT* 6 mL 10   clotrimazole-betamethasone (LOTRISONE) cream APPLY TOPICALLY 2 TIMES PER DAY 45 g 2   cyanocobalamin (,VITAMIN B-12,) 1000 MCG/ML injection INJECT 1ML=1000MCG INTRAMUSCULARLY EVERY MONTH ON THE 30TH 1 mL 11   D3-1000 25 MCG (1000 UT) tablet TAKEK 1 TABLET BY MOUTH EVERY DAY FOR SUPPLEMENT 30 tablet 5   docusate sodium (COLACE) 100 MG capsule TAKE 1 CAPSULE BY MOUTH EVERY OTHER DAY 15 capsule 2   famotidine (PEPCID) 20 MG tablet One tablet daily with dinner 90 tablet 1   folic acid (FOLVITE) 1 MG tablet TAKE 1 TABLET BY MOUTH EVERY DAY 30 tablet 2   ketoconazole (NIZORAL) 2 % cream APPLY  TOIPICALLY ONCE DAILY 60 g 11   levalbuterol (XOPENEX) 0.63 MG/3ML nebulizer solution Take 3 mLs (0.63 mg total) by nebulization every 6 (six) hours as needed for wheezing or shortness of breath. 180 mL 11   levothyroxine (SYNTHROID) 125 MCG tablet Take 1 tablet (125 mcg total) by mouth daily before breakfast. 90 tablet 0   methotrexate (RHEUMATREX) 2.5 MG tablet TAKE 7 TABLETS (17.5 MG) BY MOUTH ONCE AWEEK ON FRIDAY. 28 tablet 3   montelukast (SINGULAIR) 10 MG tablet Take 1 tablet by mouth daily.     Multiple Vitamins-Minerals (THEREMS-M)  TABS TAKE 1 TABLET BY MOUTH EVERY DAY FOR SUPPLEMENT 90 tablet 1   omeprazole (PRILOSEC) 20 MG capsule Take 20 mg by mouth daily.     predniSONE (DELTASONE) 2.5 MG tablet TAKE 1 TABLET BY MOUTH EACH DAY 30 tablet 11   Probiotic Product (ALIGN) 4 MG CAPS 1 tablet daily for 4 weeks 30 capsule 0   SYMBICORT 160-4.5 MCG/ACT inhaler Inhale into the lungs.     SYRINGE-NEEDLE, DISP, 3 ML (BD ECLIPSE SYRINGE) 25G X 1" 3 ML MISC FOR USE WITH CYANOCOBALAMIN 1 each 10   traMADol (ULTRAM) 50 MG tablet Take 1 tablet (50 mg total) by mouth daily as needed for moderate pain. 30 tablet 5   benzonatate (TESSALON) 100 MG capsule Take 1 capsule (100 mg total) by mouth 2 (two) times daily as needed for cough. 20 capsule 0   fluticasone (FLONASE) 50 MCG/ACT nasal spray Place into the nose.     predniSONE (STERAPRED UNI-PAK 21 TAB) 10 MG (21) TBPK tablet PO: Take 6 tablets on day 1:Take 5 tablets day 2:Take 4 tablets day 3: Take 3 tablets day 4:Take 2 tablets day five: 5 Take 1 tablet day 6 (Patient not taking: Reported on 06/04/2021) 21 tablet 0   No facility-administered medications prior to visit.    Allergies  Allergen Reactions   Erythromycin Shortness Of Breath, Rash and Other (See Comments)    Reaction:  Unknown     Oxycontin [Oxycodone]    Tramadol Nausea And Vomiting   Albuterol Rash and Other (See Comments)    "Jittery"     Review of Systems  Constitutional:   Positive for fatigue. Negative for activity change, appetite change, chills, diaphoresis, fever and unexpected weight change.  HENT:  Positive for congestion, postnasal drip and rhinorrhea. Negative for ear discharge, ear pain, sinus pressure, sinus pain, sneezing, sore throat, tinnitus, trouble swallowing and voice change.   Respiratory:  Positive for cough, shortness of breath and wheezing. Negative for apnea, choking, chest tightness and stridor.   Cardiovascular: Negative.   Gastrointestinal: Negative.   Genitourinary: Negative.   Musculoskeletal: Negative.   Neurological: Negative.   Hematological: Negative.   Psychiatric/Behavioral: Negative.        Objective:    Today's Vitals   06/04/21 1318  BP: 110/70  Pulse: 64  SpO2: 96%  Weight: 144 lb 12.8 oz (65.7 kg)  Height: 4' 7.98" (1.422 m)  PainSc: 0-No pain   Body mass index is 32.48 kg/m.  Physical Exam Vitals reviewed.  Constitutional:      General: She is not in acute distress.    Appearance: She is well-developed. She is not diaphoretic.     Interventions: She is not intubated. HENT:     Head: Normocephalic and atraumatic.     Right Ear: External ear normal.     Left Ear: External ear normal.     Nose: Nose normal.     Mouth/Throat:     Mouth: Mucous membranes are moist.     Pharynx: No oropharyngeal exudate.  Eyes:     General: Lids are normal. No scleral icterus.       Right eye: No discharge.        Left eye: No discharge.     Conjunctiva/sclera: Conjunctivae normal.     Right eye: Right conjunctiva is not injected. No exudate or hemorrhage.    Left eye: Left conjunctiva is not injected. No exudate or hemorrhage.    Pupils: Pupils are equal, round, and reactive to  light.  Neck:     Thyroid: No thyroid mass or thyromegaly.     Vascular: No carotid bruit or JVD.     Trachea: Trachea and phonation normal. No tracheal deviation.  Cardiovascular:     Rate and Rhythm: Normal rate and regular rhythm.      Pulses: Normal pulses.          Radial pulses are 2+ on the right side and 2+ on the left side.       Posterior tibial pulses are 2+ on the right side and 2+ on the left side.     Heart sounds: Normal heart sounds, S1 normal and S2 normal. Heart sounds not distant. No murmur heard.   No friction rub. No gallop.  Pulmonary:     Effort: Pulmonary effort is normal. No tachypnea, bradypnea, accessory muscle usage or respiratory distress. She is not intubated.     Breath sounds: No stridor. Examination of the right-upper field reveals wheezing. Examination of the left-upper field reveals wheezing. Wheezing present. No rhonchi or rales.     Comments: Mild to moderate expiratory wheezing intermittently.  Chest:     Chest wall: No tenderness.  Abdominal:     General: Bowel sounds are normal. There is no distension or abdominal bruit.     Palpations: Abdomen is soft. There is no shifting dullness, fluid wave, hepatomegaly, splenomegaly, mass or pulsatile mass.     Tenderness: There is no abdominal tenderness. There is no guarding or rebound.     Hernia: No hernia is present.  Musculoskeletal:        General: No tenderness or deformity. Normal range of motion.     Cervical back: Full passive range of motion without pain, normal range of motion and neck supple. No edema, erythema or rigidity. Normal range of motion.  Lymphadenopathy:     Head:     Right side of head: No tonsillar or preauricular adenopathy.     Left side of head: No tonsillar or preauricular adenopathy.     Cervical: No cervical adenopathy.     Right cervical: No superficial, deep or posterior cervical adenopathy.    Left cervical: No superficial, deep or posterior cervical adenopathy.  Skin:    General: Skin is warm and dry.     Coloration: Skin is not pale.     Findings: No abrasion, bruising, burn, ecchymosis, erythema, lesion, petechiae or rash.     Nails: There is no clubbing.  Neurological:     Mental Status: She is alert  and oriented to person, place, and time.     GCS: GCS eye subscore is 4. GCS verbal subscore is 5. GCS motor subscore is 6.     Cranial Nerves: No cranial nerve deficit.     Sensory: No sensory deficit.     Motor: No weakness, tremor, atrophy, abnormal muscle tone or seizure activity.     Coordination: Coordination normal.     Gait: Gait normal.     Deep Tendon Reflexes: Reflexes are normal and symmetric.  Psychiatric:        Speech: Speech normal.        Behavior: Behavior normal.        Thought Content: Thought content normal.        Judgment: Judgment normal.    BP 110/70    Pulse 64    Ht 4' 7.98" (1.422 m)    Wt 144 lb 12.8 oz (65.7 kg)    LMP 04/11/1981  SpO2 96% Comment: recheck   BMI 32.48 kg/m  Wt Readings from Last 3 Encounters:  06/04/21 144 lb 12.8 oz (65.7 kg)  05/28/21 146 lb (66.2 kg)  03/10/21 151 lb 3.2 oz (68.6 kg)    Health Maintenance Due  Topic Date Due   Hepatitis C Screening  Never done   Zoster Vaccines- Shingrix (1 of 2) Never done   Pneumococcal Vaccine 24-21 Years old (3 - PCV) 06/07/2017   COVID-19 Vaccine (5 - Booster for Moderna series) 10/14/2020    There are no preventive care reminders to display for this patient.   Lab Results  Component Value Date   TSH 0.55 06/04/2021   Lab Results  Component Value Date   WBC 12.1 (H) 06/04/2021   HGB 13.5 06/04/2021   HCT 40.9 06/04/2021   MCV 107.9 (H) 06/04/2021   PLT 386.0 06/04/2021   Lab Results  Component Value Date   NA 146 (H) 06/04/2021   K 4.3 06/04/2021   CO2 36 (H) 06/04/2021   GLUCOSE 81 06/04/2021   BUN 17 06/04/2021   CREATININE 1.04 06/04/2021   BILITOT 0.4 06/04/2021   ALKPHOS 75 06/04/2021   AST 25 06/04/2021   ALT 23 06/04/2021   PROT 6.2 06/04/2021   ALBUMIN 3.4 (L) 06/04/2021   CALCIUM 8.4 06/04/2021   ANIONGAP 8 05/14/2020   GFR 61.67 06/04/2021   Lab Results  Component Value Date   CHOL 174 05/19/2015   Lab Results  Component Value Date   HDL 47.50  05/19/2015   Lab Results  Component Value Date   LDLCALC 107 (H) 05/19/2015   Lab Results  Component Value Date   TRIG 97.0 05/19/2015   Lab Results  Component Value Date   CHOLHDL 4 05/19/2015   Lab Results  Component Value Date   HGBA1C 5.1 03/10/2021       Assessment & Plan:   Problem List Items Addressed This Visit       Respiratory   Viral upper respiratory tract infection - Primary     Endocrine   Hypothyroid   Relevant Orders   TSH (Completed)     Other   Acute cough   Relevant Orders   DG Chest 2 View (Completed)   TSH (Completed)   Wheezing   Relevant Orders   TSH (Completed)   1. Bronchitis   Prednisone dose pack repeat starting tomorrow continue nebulizer and Symbicort at home  - CBC with Differential/Platelet - Comprehensive metabolic panel - DG Chest 2 View to rule out any pneumonia.  - TSH  2. Acute cough Mucinex DM sent to pharmacy PRN cough. Benzonatate discontinued.  Elevated White blood cell count due to be rechecked already will recheck today.  - DG Chest 2 View - TSH Complete Augmentin,see AVS given with all instructions.Tolerating Augmentin without any abnormal side effects.   3. Wheezing  - triamcinolone acetonide (KENALOG-40) injection 60 mg in office today given.  Discussed side effects with mom. Patient tolerated well without any difficulties.  4. Hypothyroidism, unspecified type Will check mom reports has lost 2 lbs in past month. Eating well. No nausea or vomiting.  - TSH   Recheck Chest x ray to be sure no pneumonia going to [facility] as our office x ray is closed. 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 Benefits of steroids outweigh risk  at this time and difficult to determine how well inhaler use and nebulizer use is with patient and patients wheezing today in the office and  chest congestion. Hold normal daily dose of prednisone until dose pack completed.  Meds ordered this encounter  Medications    triamcinolone acetonide (KENALOG-40) injection 60 mg   DISCONTD: predniSONE (STERAPRED UNI-PAK 21 TAB) 10 MG (21) TBPK tablet    Sig: PO: Take 6 tablets on day 1:Take 5 tablets day 2:Take 4 tablets day 3: Take 3 tablets day 4:Take 2 tablets day five: 5 Take 1 tablet day 6    Dispense:  21 tablet    Refill:  0   dextromethorphan-guaiFENesin (MUCINEX DM) 30-600 MG 12hr tablet    Sig: Take 1 tablet by mouth 2 (two) times daily as needed for cough.    Dispense:  20 tablet    Refill:  0   predniSONE (STERAPRED UNI-PAK 21 TAB) 10 MG (21) TBPK tablet    Sig: PO: Take 6 tablets on day 1:Take 5 tablets day 2:Take 4 tablets day 3: Take 3 tablets day 4:Take 2 tablets day five: 5 Take 1 tablet day 6    Dispense:  21 tablet    Refill:  0    Not to pick up from group home, canceling one sent there. Advise to hold 2.58m prednisone daily dose while on dose pack. Start this on 06/05/2021.   Red Flags discussed. The patient was given clear instructions to go to ER or return to medical center if any red flags develop, symptoms do not improve, worsen or new problems develop. They verbalized understanding.  Return in about 1 week (around 06/11/2021), or if symptoms worsen or fail to improve, for at any time for any worsening symptoms, Go to Emergency room/ urgent care if worse.  MMarcille Buffy FNP

## 2021-06-05 NOTE — Progress Notes (Signed)
Called pharmacy and cancelled medication.

## 2021-06-07 ENCOUNTER — Other Ambulatory Visit: Payer: Self-pay | Admitting: Adult Health

## 2021-06-07 MED ORDER — DOXYCYCLINE HYCLATE 100 MG PO TABS: 100.0000 mg | ORAL_TABLET | Freq: Two times a day (BID) | ORAL | 0 refills | Status: DC

## 2021-06-07 MED ORDER — LEVOTHYROXINE SODIUM 112 MCG PO TABS: 112.0000 ug | ORAL_TABLET | Freq: Every day | ORAL | 1 refills | Status: DC

## 2021-06-07 NOTE — Progress Notes (Signed)
Adding on Doxycyline 100 mg once by mouth twice daily for 10 days. Be sure to use probiotic for 3 weeks. Repeat labs one week after finishing steroid taper pack - schedule CBC and CMP please add and schedule.   TSH is low, since she has been having some weight loss will adjust the dose of Synthroid to 112 mcg once daily. Recheck TSH in 2 months. Discontinue Synthroid 125 mcg once daily.   Meds ordered this encounter Medications  levothyroxine (SYNTHROID) 112 MCG tablet   Sig: Take 1 tablet (112 mcg total) by mouth daily.   Dispense:  90 tablet   Refill:  1  doxycycline (VIBRA-TABS) 100 MG tablet   Sig: Take 1 tablet (100 mg total) by mouth 2 (two) times daily.   Dispense:  20 tablet   Refill:  0 Sent to [facility].   Crecencio Mc, MD  Cindy Oconnor, Cindy Aline, FNP I agree with adding doxycycline given her circumstances.  Stress the need for probiotics x 3 weeks. Repeat labs one week after finishing steroid taper.

## 2021-06-07 NOTE — Progress Notes (Signed)
Meds ordered this encounter  Medications   levothyroxine (SYNTHROID) 112 MCG tablet    Sig: Take 1 tablet (112 mcg total) by mouth daily.    Dispense:  90 tablet    Refill:  1   doxycycline (VIBRA-TABS) 100 MG tablet    Sig: Take 1 tablet (100 mg total) by mouth 2 (two) times daily.    Dispense:  20 tablet    Refill:  0

## 2021-06-09 ENCOUNTER — Other Ambulatory Visit: Payer: Self-pay | Admitting: Adult Health

## 2021-06-09 ENCOUNTER — Telehealth: Payer: Self-pay | Admitting: Internal Medicine

## 2021-06-09 DIAGNOSIS — R051 Acute cough: Secondary | ICD-10-CM

## 2021-06-09 MED ORDER — DOXYCYCLINE HYCLATE 100 MG PO TABS: 100.0000 mg | ORAL_TABLET | Freq: Two times a day (BID) | ORAL | 0 refills | Status: DC

## 2021-06-09 MED ORDER — MINOCYCLINE HCL 100 MG PO CAPS: 100.0000 mg | ORAL_CAPSULE | Freq: Two times a day (BID) | ORAL | 0 refills | Status: DC

## 2021-06-09 MED ORDER — LEVOTHYROXINE SODIUM 112 MCG PO TABS: 112.0000 ug | ORAL_TABLET | Freq: Every day | ORAL | 1 refills | Status: DC

## 2021-06-09 NOTE — Progress Notes (Signed)
Mom requested to be sent to [facility] previously sent to [facility] 2 days ago. Urged importance of starting medications.   Meds ordered this encounter  Medications   doxycycline (VIBRA-TABS) 100 MG tablet    Sig: Take 1 tablet (100 mg total) by mouth 2 (two) times daily.    Dispense:  20 tablet    Refill:  0   levothyroxine (SYNTHROID) 112 MCG tablet    Sig: Take 1 tablet (112 mcg total) by mouth daily.    Dispense:  90 tablet    Refill:  1    Discontinue previous dose on file of synthroid.

## 2021-06-09 NOTE — Progress Notes (Signed)
Meds ordered this encounter  Medications   minocycline (MINOCIN) 100 MG capsule    Sig: Take 1 capsule (100 mg total) by mouth 2 (two) times daily.    Dispense:  20 capsule    Refill:  0    Please discontinue the doxycycline.

## 2021-06-09 NOTE — Telephone Encounter (Signed)
Jeani Hawking from [facility] called in stating that NP Flinchum had prescribe an antibiotic (doxycycline (VIBRA-TABS) 100 MG tablet) to Pt. Jeani Hawking stated that [facility] made the medication (doxycycline (VIBRA-TABS) 100 MG tablet) non formulary. Jeani Hawking stated that the formulary version of the medication is minocycline. Jeani Hawking stated that NP Flinchum can do a PA on the medication. Jeani Hawking stated if NP Flinchum decide to change medication to minocycline that NP Flinchum would need to discontinue medication (doxycycline (VIBRA-TABS) 100 MG tablet) for Pt.

## 2021-06-09 NOTE — Telephone Encounter (Signed)
Pt need a note to return to work

## 2021-06-09 NOTE — Telephone Encounter (Signed)
She has a follow up on 06/10/20 lets determine at that visit if she is improved.

## 2021-06-09 NOTE — Telephone Encounter (Signed)
When can pt return to work?

## 2021-06-09 NOTE — Telephone Encounter (Signed)
Meds ordered this encounter  Medications   minocycline (MINOCIN) 100 MG capsule    Sig: Take 1 capsule (100 mg total) by mouth 2 (two) times daily.    Dispense:  20 capsule    Refill:  0    Please discontinue the doxycycline.  Please let pharmacy know that the doxycycline discontinued and minocycline was sent.

## 2021-06-10 ENCOUNTER — Other Ambulatory Visit: Payer: Self-pay

## 2021-06-10 ENCOUNTER — Ambulatory Visit (INDEPENDENT_AMBULATORY_CARE_PROVIDER_SITE_OTHER): Payer: Medicare Other | Admitting: Adult Health

## 2021-06-10 ENCOUNTER — Encounter: Payer: Self-pay | Admitting: Adult Health

## 2021-06-10 VITALS — BP 120/80 | HR 91 | Temp 98.5°F | Ht <= 58 in | Wt 143.8 lb

## 2021-06-10 DIAGNOSIS — D72829 Elevated white blood cell count, unspecified: Secondary | ICD-10-CM | POA: Diagnosis not present

## 2021-06-10 DIAGNOSIS — R051 Acute cough: Secondary | ICD-10-CM | POA: Diagnosis not present

## 2021-06-10 DIAGNOSIS — J4541 Moderate persistent asthma with (acute) exacerbation: Secondary | ICD-10-CM

## 2021-06-10 DIAGNOSIS — E039 Hypothyroidism, unspecified: Secondary | ICD-10-CM | POA: Diagnosis not present

## 2021-06-10 MED ORDER — DM-GUAIFENESIN ER 30-600 MG PO TB12: 1.0000 | ORAL_TABLET | Freq: Two times a day (BID) | ORAL | 0 refills | Status: DC | PRN

## 2021-06-10 NOTE — Assessment & Plan Note (Signed)
Treated as suspect bacterial infection in prescence of leukocytosis was origially treated with Augmentin and added Doxycycline, was out of stock and substituted with Minocycline which sheis tolerating well.  She has been using levo albuterol nebulizer and has much improvement and cough mostly resolved without any wheezing or adventitious lung sounds at office visit 06/10/20.  Ok to continue breathing treatments prn and prescription cough medication PRN for up to one more week.  Follow treatment plan from office  if not improving or any worsening within 72 hours and also return to office or open medical facility at ANYTIME if any symptoms persist, change, or worsen or you have any further concerns or questions. Call 911 immediately for emergencies.

## 2021-06-10 NOTE — Assessment & Plan Note (Signed)
Decreased dose of synthroid to 112 mcg once daily as patient is losing some weight per mom. Was taking 125 mcg once daily. TSH was low end normal. Recheck TSH in 3 months from dose change mom aware.

## 2021-06-10 NOTE — Telephone Encounter (Signed)
Pt seen in office and made aware. Placed call to pt pharmacy. Pharmacist stated there was no prescription on file

## 2021-06-10 NOTE — Assessment & Plan Note (Signed)
Given patients substantial improvement and resolving of symptoms, suspect due to prednisone. She will have recheck in one week after completing prednisone. Lab ordered and mother verbalized understanding.

## 2021-06-10 NOTE — Patient Instructions (Addendum)
Complete minocycline antibiotic as prescribed. Ok to use prescription cough medication as prescribed PRN for cough.  Nebulizer levo albuterol treatments as ordered for any wheezing or cough.  Recheck CBC in one week after completing the prednisone/steroid.   Follow treatment plan from office  if not improving or any worsening within 72 hours and also return to office or open medical facility at ANYTIME if any symptoms persist, change, or worsen or you have any further concerns or questions. Call 911 immediately for emergencies.    Acute Bronchitis, Adult Acute bronchitis is when air tubes in the lungs (bronchi) suddenly get swollen. The condition can make it hard for you to breathe. In adults, acute bronchitis usually goes away within 2 weeks. A cough caused by bronchitis may last up to 3 weeks. Smoking, allergies, and asthma can make the condition worse. What are the causes? Germs that cause cold and flu (viruses). The most common cause of this condition is the virus that causes the common cold. Bacteria. Substances that bother (irritate) the lungs, including: Smoke from cigarettes and other types of tobacco. Dust and pollen. Fumes from chemicals, gases, or burned fuel. Indoor or outdoor air pollution. What increases the risk? A weak body's defense system. This is also called the immune system. Any condition that affects your lungs and breathing, such as asthma. What are the signs or symptoms? A cough. Coughing up clear, yellow, or green mucus. Making high-pitched whistling sounds when you breathe, most often when you breathe out (wheezing). Runny or stuffy nose. Having too much mucus in your lungs (chest congestion). Shortness of breath. Body aches. A sore throat. How is this treated? Acute bronchitis may go away over time without treatment. Your doctor may tell you to: Drink more fluids. This will help thin your mucus so it is easier to cough up. Use a device that gets medicine  into your lungs (inhaler). Use a vaporizer or a humidifier. These are machines that add water to the air. This helps with coughing and poor breathing. Take a medicine that thins mucus and helps clear it from your lungs. Take a medicine that prevents or stops coughing. It is not common to take an antibiotic medicine for this condition. Follow these instructions at home:  Take over-the-counter and prescription medicines only as told by your doctor. Use an inhaler, vaporizer, or humidifier as told by your doctor. Take two teaspoons (10 mL) of honey at bedtime. This helps lessen your coughing at night. Drink enough fluid to keep your pee (urine) pale yellow. Do not smoke or use any products that contain nicotine or tobacco. If you need help quitting, ask your doctor. Get a lot of rest. Return to your normal activities when your doctor says that it is safe. Keep all follow-up visits. How is this prevented?  Wash your hands often with soap and water for at least 20 seconds. If you cannot use soap and water, use hand sanitizer. Avoid contact with people who have cold symptoms. Try not to touch your mouth, nose, or eyes with your hands. Avoid breathing in smoke or chemical fumes. Make sure to get the flu shot every year. Contact a doctor if: Your symptoms do not get better in 2 weeks. You have trouble coughing up the mucus. Your cough keeps you awake at night. You have a fever. Get help right away if: You cough up blood. You have chest pain. You have very bad shortness of breath. You faint or keep feeling like you are going  to faint. You have a very bad headache. Your fever or chills get worse. These symptoms may be an emergency. Get help right away. Call your local emergency services (911 in the [place] Do not wait to see if the symptoms will go away. Do not drive yourself to the hospital. Summary Acute bronchitis is when air tubes in the lungs (bronchi) suddenly get swollen. In adults,  acute bronchitis usually goes away within 2 weeks. Drink more fluids. This will help thin your mucus so it is easier to cough up. Take over-the-counter and prescription medicines only as told by your doctor. Contact a doctor if your symptoms do not improve after 2 weeks of treatment. This information is not intended to replace advice given to you by your health care provider. Make sure you discuss any questions you have with your health care provider. Document Revised: 09/17/2020 Document Reviewed: 09/17/2020 Elsevier Patient Education  [place]. Cough, Adult A cough helps to clear your throat and lungs. A cough may be a sign of an illness or another medical condition. An acute cough may only last 2-3 weeks, while a chronic cough may last 8 or more weeks. Many things can cause a cough. They include: Germs (viruses or bacteria) that attack the airway. Breathing in things that bother (irritate) your lungs. Allergies. Asthma. Mucus that runs down the back of your throat (postnasal drip). Smoking. Acid backing up from the stomach into the tube that moves food from the mouth to the stomach (gastroesophageal reflux). Some medicines. Lung problems. Other medical conditions, such as heart failure or a blood clot in the lung (pulmonary embolism). Follow these instructions at home: Medicines Take over-the-counter and prescription medicines only as told by your doctor. Talk with your doctor before you take medicines that stop a cough (cough suppressants). Lifestyle  Do not smoke, and try not to be around smoke. Do not use any products that contain nicotine or tobacco, such as cigarettes, e-cigarettes, and chewing tobacco. If you need help quitting, ask your doctor. Drink enough fluid to keep your pee (urine) pale yellow. Avoid caffeine. Do not drink alcohol if your doctor tells you not to drink. General instructions  Watch for any changes in your cough. Tell your doctor about  them. Always cover your mouth when you cough. Stay away from things that make you cough, such as perfume, candles, campfire smoke, or cleaning products. If the air is dry, use a cool mist vaporizer or humidifier in your home. If your cough is worse at night, try using extra pillows to raise your head up higher while you sleep. Rest as needed. Keep all follow-up visits as told by your doctor. This is important. Contact a doctor if: You have new symptoms. You cough up pus. Your cough does not get better after 2-3 weeks, or your cough gets worse. Cough medicine does not help your cough and you are not sleeping well. You have pain that gets worse or pain that is not helped with medicine. You have a fever. You are losing weight and you do not know why. You have night sweats. Get help right away if: You cough up blood. You have trouble breathing. Your heartbeat is very fast. These symptoms may be an emergency. Do not wait to see if the symptoms will go away. Get medical help right away. Call your local emergency services (911 in the [place] Do not drive yourself to the hospital. Summary A cough helps to clear your throat and lungs. Many things  can cause a cough. Take over-the-counter and prescription medicines only as told by your doctor. Always cover your mouth when you cough. Contact a doctor if you have new symptoms or you have a cough that does not get better or gets worse. This information is not intended to replace advice given to you by your health care provider. Make sure you discuss any questions you have with your health care provider. Document Revised: 07/06/2019 Document Reviewed: 06/05/2018 Elsevier Patient Education  [place].

## 2021-06-10 NOTE — Telephone Encounter (Signed)
noted 

## 2021-06-10 NOTE — Progress Notes (Signed)
Acute Office Visit  Subjective:    Patient ID: Cindy Oconnor, female    DOB: [birth year], 51 y.o.   MRN: [id number]  Chief Complaint  Patient presents with   Follow-up    1 week    HPI Patient is in today for for follow up on cough and congestion, she is feeling much better today here accompanied by mom who reports she is not coughing as she was before and cough is very minimal. Cough is now minimal and no wheezing or productive cough. She was prescribed Doxycycline after round of Augmentin since she is immunocompromised.  Pharmacy at her group home only had minocycline and this was substituted. She is doing well with medication.   TSH was low end normal and synthroid was changed to 112 mcg once daily and synthroid 125 mcg dose was discontinued. With recheck TSH in 3 months. Mom had reported patient has a great appetite and was losing some weight.  denies any fever, body aches,chills, rash, chest pain, shortness of breath, nausea, vomiting, or diarrhea.  Denies any abdominal pain or back pain.   Chest x ray on 12/29 and 06/04/20 essentially unchanged. CLINICAL DATA:  Elevated white blood cell count, rule out pneumonia.   EXAM: CHEST - 2 VIEW   COMPARISON:  05/28/2021   FINDINGS: Unchanged cardiac and mediastinal contours.   No new focal pulmonary opacity. Unchanged coarsened interstitial markings redemonstrated opacity at the right posterior lung base, which correlates with extrapleural fat on the 03/19/2020 CT. No pleural effusion. No acute osseous abnormality.   IMPRESSION: No active cardiopulmonary disease.     Electronically Signed   By: Merilyn Baba M.D.   On: 06/04/2021 13:51     Result History  DG Chest 2 View (Order #[id number]) on 06/04/2021 - Order Result History Report  DG Chest 2 View: Result Notes  Baron Hamper, [facility]  06/05/2021 10:39 AM EST     Patient viewed message on Mychart on 1/6 at 7:36 am.    Past Medical History:  Diagnosis Date    Arthritis    Asthma    unspecified   Blood clot in vein    Chicken pox    Colitis    Collagen vascular disease ([place])    COPD (chronic obstructive pulmonary disease) ([place])    COVID-19 virus detected 04/30/2019   Nov 23 tested positive,  LRI symptoms    Deafness in left ear    Disease of thyroid gland    Down syndrome    DVT (deep venous thrombosis) (H[facility])    Fractures    GERD (gastroesophageal reflux disease)    Hypothyroidism    unspecified   Inflammatory arthritis 01/30/2014   a. Positive anti-CCP antibodies, negative rheumatoid factor, positive FANA. b. Methotrexate, Prednisone, Plaquenil.    Joint pain    Lupus ([place]) 1995   Osteoarthritis 01/30/2014   a. Lumbar disc disease. b. Trigger nodules   Osteoporosis 01/30/2014   a. Boniva   RA (rheumatoid arthritis) (HCC)    Shingles    Ulcerative (chronic) enterocolitis (HCC)    Ulcerative colitis ([place])     Past Surgical History:  Procedure Laterality Date   ABDOMINAL HYSTERECTOMY     ABDOMINAL HYSTERECTOMY W/ PARTIAL VAGINACTOMY  1992   BUNIONECTOMY Bilateral    Bunionectomy great toe   COLONOSCOPY  04/16/2013   Hx of ulcerative colitis - repeat 2 years per Dr. Rayann Heman   COLONOSCOPY  09/24/2003   Dr. Brita Romp   FOOT SURGERY Bilateral  x2   HALLUX VALGUS CORRECTION     KNEE ARTHROPLASTY Left 01/10/2017   Procedure: COMPUTER ASSISTED TOTAL KNEE ARTHROPLASTY;  Surgeon: Dereck Leep, MD;  Location: [facility] ORS;  Service: Orthopedics;  Laterality: Left;   KNEE ARTHROPLASTY Right 08/10/2017   Procedure: COMPUTER ASSISTED TOTAL KNEE ARTHROPLASTY;  Surgeon: Dereck Leep, MD;  Location: [facility] ORS;  Service: Orthopedics;  Laterality: Right;    Family History  Problem Relation Age of Onset   Heart disease Mother    Diabetes Mother    Breast cancer Mother 65   Heart disease Father    Diabetes Father    Alcohol abuse Maternal Grandmother    Arthritis Maternal Grandmother    Stroke Maternal Grandmother    Diabetes  Maternal Grandmother    Alcohol abuse Maternal Grandfather    Arthritis Maternal Grandfather    Stroke Maternal Grandfather    Diabetes Maternal Grandfather     Social History   Socioeconomic History   Marital status: Single    Spouse name: Not on file   Number of children: 0   Years of education: Not on file   Highest education level: Not on file  Occupational History   Not on file  Tobacco Use   Smoking status: Never   Smokeless tobacco: Never  Vaping Use   Vaping Use: Never used  Substance and Sexual Activity   Alcohol use: No   Drug use: No   Sexual activity: Not on file  Other Topics Concern   Not on file  Social History Narrative   Lives at [facility] group home.   Social Determinants of Health   Financial Resource Strain: Not on file  Food Insecurity: Not on file  Transportation Needs: Not on file  Physical Activity: Not on file  Stress: Not on file  Social Connections: Not on file  Intimate Partner Violence: Not on file    Outpatient Medications Prior to Visit  Medication Sig Dispense Refill   alendronate (FOSAMAX) 70 MG tablet Take 70 mg by mouth once a week. Take with a full glass of water on an empty stomach.     amoxicillin-clavulanate (AUGMENTIN) 875-125 MG tablet Take 1 tablet by mouth 2 (two) times daily. 20 tablet 0   atorvastatin (LIPITOR) 20 MG tablet Take 1 tablet (20 mg total) by mouth daily. 90 tablet 3   azelastine (OPTIVAR) 0.05 % ophthalmic solution PLACE 1 DROP EACH EYE TWICE DAILY *WAIT 3-5 MINUTES BETWEEN 2 EYE MEDS* *STORE UPRIGHT* 6 mL 10   clotrimazole-betamethasone (LOTRISONE) cream APPLY TOPICALLY 2 TIMES PER DAY 45 g 2   cyanocobalamin (,VITAMIN B-12,) 1000 MCG/ML injection INJECT 1ML=1000MCG INTRAMUSCULARLY EVERY MONTH ON THE 30TH 1 mL 11   D3-1000 25 MCG (1000 UT) tablet TAKEK 1 TABLET BY MOUTH EVERY DAY FOR SUPPLEMENT 30 tablet 5   docusate sodium (COLACE) 100 MG capsule TAKE 1 CAPSULE BY MOUTH EVERY OTHER DAY 15  capsule 2   famotidine (PEPCID) 20 MG tablet One tablet daily with dinner 90 tablet 1   folic acid (FOLVITE) 1 MG tablet TAKE 1 TABLET BY MOUTH EVERY DAY 30 tablet 2   ketoconazole (NIZORAL) 2 % cream APPLY TOIPICALLY ONCE DAILY 60 g 11   levalbuterol (XOPENEX) 0.63 MG/3ML nebulizer solution Take 3 mLs (0.63 mg total) by nebulization every 6 (six) hours as needed for wheezing or shortness of breath. 180 mL 11   levothyroxine (SYNTHROID) 112 MCG tablet Take 1 tablet (112 mcg total) by mouth daily.  90 tablet 1   methotrexate (RHEUMATREX) 2.5 MG tablet TAKE 7 TABLETS (17.5 MG) BY MOUTH ONCE AWEEK ON FRIDAY. 28 tablet 3   minocycline (MINOCIN) 100 MG capsule Take 1 capsule (100 mg total) by mouth 2 (two) times daily. 20 capsule 0   montelukast (SINGULAIR) 10 MG tablet Take 1 tablet by mouth daily.     Multiple Vitamins-Minerals (THEREMS-M) TABS TAKE 1 TABLET BY MOUTH EVERY DAY FOR SUPPLEMENT 90 tablet 1   omeprazole (PRILOSEC) 20 MG capsule Take 20 mg by mouth daily.     predniSONE (DELTASONE) 2.5 MG tablet TAKE 1 TABLET BY MOUTH EACH DAY 30 tablet 11   predniSONE (STERAPRED UNI-PAK 21 TAB) 10 MG (21) TBPK tablet PO: Take 6 tablets on day 1:Take 5 tablets day 2:Take 4 tablets day 3: Take 3 tablets day 4:Take 2 tablets day five: 5 Take 1 tablet day 6 21 tablet 0   Probiotic Product (ALIGN) 4 MG CAPS 1 tablet daily for 4 weeks 30 capsule 0   SYMBICORT 160-4.5 MCG/ACT inhaler Inhale into the lungs.     SYRINGE-NEEDLE, DISP, 3 ML (BD ECLIPSE SYRINGE) 25G X 1" 3 ML MISC FOR USE WITH CYANOCOBALAMIN 1 each 10   traMADol (ULTRAM) 50 MG tablet Take 1 tablet (50 mg total) by mouth daily as needed for moderate pain. 30 tablet 5   dextromethorphan-guaiFENesin (MUCINEX DM) 30-600 MG 12hr tablet Take 1 tablet by mouth 2 (two) times daily as needed for cough. 20 tablet 0   fluticasone (FLONASE) 50 MCG/ACT nasal spray Place into the nose.     No facility-administered medications prior to visit.    Allergies   Allergen Reactions   Erythromycin Shortness Of Breath, Rash and Other (See Comments)    Reaction:  Unknown     Oxycontin [Oxycodone]    Tramadol Nausea And Vomiting   Albuterol Rash and Other (See Comments)    "Jittery"     Review of Systems  Constitutional: Negative.   HENT: Negative.    Respiratory: Negative.    Cardiovascular: Negative.   Gastrointestinal: Negative.   Genitourinary: Negative.   Musculoskeletal: Negative.   Neurological: Negative.   Hematological: Negative.   Psychiatric/Behavioral:  The patient is hyperactive.       Objective:   Blood pressure 120/80, pulse 91, temperature 98.5 F (36.9 C), temperature source Oral, height 4' 8"  (1.422 m), weight 143 lb 12.8 oz (65.2 kg), last menstrual period 04/11/1981, SpO2 95 %.  Physical Exam   General: Appearance:    Mildly obese female in no acute distress  Eyes:    PERRL, conjunctiva/corneas clear, EOM's intact       Lungs:     Clear to auscultation bilaterally, respirations unlabored  Heart:    Normal heart rate. Normal rhythm.    MS:   All extremities are intact.    Neurologic:   Awake, alert, oriented x 3. No apparent focal neurological           defect.     BP 120/80 (BP Location: Left Arm, Patient Position: Sitting, Cuff Size: Normal)    Pulse 91    Temp 98.5 F (36.9 C) (Oral)    Ht 4' 8"  (1.422 m)    Wt 143 lb 12.8 oz (65.2 kg)    LMP 04/11/1981    SpO2 95%    BMI 32.24 kg/m  Wt Readings from Last 3 Encounters:  06/10/21 143 lb 12.8 oz (65.2 kg)  06/04/21 144 lb 12.8 oz (65.7 kg)  05/28/21  146 lb (66.2 kg)    Health Maintenance Due  Topic Date Due   Hepatitis C Screening  Never done   Zoster Vaccines- Shingrix (1 of 2) Never done   Pneumococcal Vaccine 71-59 Years old (3 - PCV) 06/07/2017   COVID-19 Vaccine (5 - Booster for Moderna series) 10/14/2020    There are no preventive care reminders to display for this patient.   Lab Results  Component Value Date   TSH 0.55 06/04/2021   Lab  Results  Component Value Date   WBC 12.1 (H) 06/04/2021   HGB 13.5 06/04/2021   HCT 40.9 06/04/2021   MCV 107.9 (H) 06/04/2021   PLT 386.0 06/04/2021   Lab Results  Component Value Date   NA 146 (H) 06/04/2021   K 4.3 06/04/2021   CO2 36 (H) 06/04/2021   GLUCOSE 81 06/04/2021   BUN 17 06/04/2021   CREATININE 1.04 06/04/2021   BILITOT 0.4 06/04/2021   ALKPHOS 75 06/04/2021   AST 25 06/04/2021   ALT 23 06/04/2021   PROT 6.2 06/04/2021   ALBUMIN 3.4 (L) 06/04/2021   CALCIUM 8.4 06/04/2021   ANIONGAP 8 05/14/2020   GFR 61.67 06/04/2021   Lab Results  Component Value Date   CHOL 174 05/19/2015   Lab Results  Component Value Date   HDL 47.50 05/19/2015   Lab Results  Component Value Date   LDLCALC 107 (H) 05/19/2015   Lab Results  Component Value Date   TRIG 97.0 05/19/2015   Lab Results  Component Value Date   CHOLHDL 4 05/19/2015   Lab Results  Component Value Date   HGBA1C 5.1 03/10/2021       Assessment & Plan:   Problem List Items Addressed This Visit       Respiratory   Asthma, chronic     Endocrine   Hypothyroid - Primary    Decreased dose of synthroid to 112 mcg once daily as patient is losing some weight per mom. Was taking 125 mcg once daily. TSH was low end normal. Recheck TSH in 3 months from dose change mom aware.      Relevant Orders   TSH     Other   Acute cough    Treated as suspect bacterial infection in prescence of leukocytosis was origially treated with Augmentin and added Doxycycline, was out of stock and substituted with Minocycline which sheis tolerating well.  She has been using levo albuterol nebulizer and has much improvement and cough mostly resolved without any wheezing or adventitious lung sounds at office visit 06/10/20.  Ok to continue breathing treatments prn and prescription cough medication PRN for up to one more week.  Follow treatment plan from office  if not improving or any worsening within 72 hours and also  return to office or open medical facility at ANYTIME if any symptoms persist, change, or worsen or you have any further concerns or questions. Call 911 immediately for emergencies.         Leukocytosis    Given patients substantial improvement and resolving of symptoms, suspect due to prednisone. She will have recheck in one week after completing prednisone. Lab ordered and mother verbalized understanding.       Relevant Orders   CBC with Differential/Platelet     Meds ordered this encounter  Medications   dextromethorphan-guaiFENesin (MUCINEX DM) 30-600 MG 12hr tablet    Sig: Take 1 tablet by mouth 2 (two) times daily as needed for cough.    Dispense:  20  tablet    Refill:  0   Patient would like to return  to work tomorrow she is much more active at visit and no coughing.    Follow treatment plan from office  if not improving or any worsening within 72 hours and also return to office or open medical facility at ANYTIME if any symptoms persist, change, or worsen or you have any further concerns or questions. Call 911 immediately for emergencies.  Mother verbalized understanding and instructions sent to group home as well as with a work note ok to return to work on 06/11/21. Return if symptoms worsen or fail to improve, for at any time for any worsening symptoms, Go to Emergency room/ urgent care if worse.   Marcille Buffy, FNP

## 2021-06-11 ENCOUNTER — Ambulatory Visit: Payer: Medicare Other | Admitting: Adult Health

## 2021-06-17 ENCOUNTER — Telehealth: Payer: Self-pay | Admitting: Internal Medicine

## 2021-06-17 NOTE — Telephone Encounter (Signed)
Patient's mother called about patient having labs done. Patient had her TSH and CBC done on 06/04/2020. M. Flinchum order TSH and CBC labs to be done again. Patient's mother wanted to know when Flinchum wanted these labs drawn.

## 2021-06-19 NOTE — Telephone Encounter (Signed)
LMTCB

## 2021-07-02 ENCOUNTER — Telehealth: Payer: Self-pay | Admitting: Internal Medicine

## 2021-07-02 NOTE — Telephone Encounter (Signed)
Please call and schedule Lab appointment for patient will need to speak with mother she is on DPR its to recheck her thyroid after dosage change.

## 2021-07-02 NOTE — Telephone Encounter (Signed)
Spoke with pt's mother and let her know that the group home can drop the paper off at the office to be filled out and then we rescheduled her appt for 07/10/2021 with Dr. Derrel Nip.

## 2021-07-02 NOTE — Telephone Encounter (Signed)
Pt mother called in stating that she need an FL2 paperwork to be filled. Pt mother stated that she called in earlier today to cancel Pt appt that was schedule for 07/03/2021 @ 10am. Pt mother called back to see if cancel appt was still avail. Pt appt for 07/03/2021 was taken. Pt mother stated she can drop off the paperwork for Dr.Tullo to fill them out. Pt mother stated that the facility advise her that Pt doesn't need appt for the [facility] paperwork to be filled out. Pt mother would like to know if she can just drop off the paperwork or do she need an appt for FL2 forms. Pt mother requesting callback

## 2021-07-02 NOTE — Telephone Encounter (Signed)
Called pt's mother to schedule repeat lab appt for pt, unable to reach did lvm for mom to call back to schedule.

## 2021-07-03 ENCOUNTER — Ambulatory Visit: Payer: Medicare Other | Admitting: Internal Medicine

## 2021-07-03 DIAGNOSIS — J4540 Moderate persistent asthma, uncomplicated: Secondary | ICD-10-CM | POA: Diagnosis not present

## 2021-07-06 NOTE — Telephone Encounter (Signed)
Pt mom called about paperwork being filled out

## 2021-07-06 NOTE — Telephone Encounter (Signed)
LMTCB. Need to let pt' mother know that paperwork has been completed and ready for pick up. Paperwork has been placed in cabinet up front.

## 2021-07-07 NOTE — Telephone Encounter (Signed)
Pt's mother is aware that paperwork is ready for pick up.

## 2021-07-10 ENCOUNTER — Ambulatory Visit: Payer: Medicare Other | Admitting: Internal Medicine

## 2021-07-14 DIAGNOSIS — Z20822 Contact with and (suspected) exposure to covid-19: Secondary | ICD-10-CM | POA: Diagnosis not present

## 2021-07-22 DIAGNOSIS — M2042 Other hammer toe(s) (acquired), left foot: Secondary | ICD-10-CM | POA: Diagnosis not present

## 2021-07-22 DIAGNOSIS — M79674 Pain in right toe(s): Secondary | ICD-10-CM | POA: Diagnosis not present

## 2021-07-22 DIAGNOSIS — M25871 Other specified joint disorders, right ankle and foot: Secondary | ICD-10-CM | POA: Diagnosis not present

## 2021-07-22 DIAGNOSIS — M79675 Pain in left toe(s): Secondary | ICD-10-CM | POA: Diagnosis not present

## 2021-07-22 DIAGNOSIS — M2041 Other hammer toe(s) (acquired), right foot: Secondary | ICD-10-CM | POA: Diagnosis not present

## 2021-07-22 DIAGNOSIS — B351 Tinea unguium: Secondary | ICD-10-CM | POA: Diagnosis not present

## 2021-07-22 DIAGNOSIS — L600 Ingrowing nail: Secondary | ICD-10-CM | POA: Diagnosis not present

## 2021-07-27 DIAGNOSIS — Z20822 Contact with and (suspected) exposure to covid-19: Secondary | ICD-10-CM | POA: Diagnosis not present

## 2021-08-10 ENCOUNTER — Other Ambulatory Visit: Payer: Self-pay | Admitting: Internal Medicine

## 2021-08-11 DIAGNOSIS — Z20822 Contact with and (suspected) exposure to covid-19: Secondary | ICD-10-CM | POA: Diagnosis not present

## 2021-08-14 ENCOUNTER — Other Ambulatory Visit: Payer: Self-pay | Admitting: Internal Medicine

## 2021-08-20 ENCOUNTER — Other Ambulatory Visit: Payer: Self-pay | Admitting: Internal Medicine

## 2021-09-01 DIAGNOSIS — Z20822 Contact with and (suspected) exposure to covid-19: Secondary | ICD-10-CM | POA: Diagnosis not present

## 2021-09-02 DIAGNOSIS — Z20828 Contact with and (suspected) exposure to other viral communicable diseases: Secondary | ICD-10-CM | POA: Diagnosis not present

## 2021-09-03 DIAGNOSIS — Z20822 Contact with and (suspected) exposure to covid-19: Secondary | ICD-10-CM | POA: Diagnosis not present

## 2021-09-04 ENCOUNTER — Other Ambulatory Visit: Payer: Self-pay | Admitting: Internal Medicine

## 2021-09-08 DIAGNOSIS — M1990 Unspecified osteoarthritis, unspecified site: Secondary | ICD-10-CM | POA: Diagnosis not present

## 2021-09-08 DIAGNOSIS — M180 Bilateral primary osteoarthritis of first carpometacarpal joints: Secondary | ICD-10-CM | POA: Diagnosis not present

## 2021-09-08 DIAGNOSIS — K5190 Ulcerative colitis, unspecified, without complications: Secondary | ICD-10-CM | POA: Diagnosis not present

## 2021-09-08 DIAGNOSIS — Z79899 Other long term (current) drug therapy: Secondary | ICD-10-CM | POA: Diagnosis not present

## 2021-09-08 DIAGNOSIS — J449 Chronic obstructive pulmonary disease, unspecified: Secondary | ICD-10-CM | POA: Diagnosis not present

## 2021-09-08 DIAGNOSIS — E039 Hypothyroidism, unspecified: Secondary | ICD-10-CM | POA: Diagnosis not present

## 2021-09-08 DIAGNOSIS — M810 Age-related osteoporosis without current pathological fracture: Secondary | ICD-10-CM | POA: Diagnosis not present

## 2021-09-08 DIAGNOSIS — M064 Inflammatory polyarthropathy: Secondary | ICD-10-CM | POA: Diagnosis not present

## 2021-09-08 DIAGNOSIS — Z79890 Hormone replacement therapy (postmenopausal): Secondary | ICD-10-CM | POA: Diagnosis not present

## 2021-09-08 DIAGNOSIS — M19032 Primary osteoarthritis, left wrist: Secondary | ICD-10-CM | POA: Diagnosis not present

## 2021-09-08 DIAGNOSIS — Q909 Down syndrome, unspecified: Secondary | ICD-10-CM | POA: Diagnosis not present

## 2021-09-08 DIAGNOSIS — J45909 Unspecified asthma, uncomplicated: Secondary | ICD-10-CM | POA: Diagnosis not present

## 2021-09-11 ENCOUNTER — Other Ambulatory Visit: Payer: Self-pay | Admitting: Internal Medicine

## 2021-09-14 DIAGNOSIS — Z20822 Contact with and (suspected) exposure to covid-19: Secondary | ICD-10-CM | POA: Diagnosis not present

## 2021-09-30 DIAGNOSIS — L040 Acute lymphadenitis of face, head and neck: Secondary | ICD-10-CM | POA: Diagnosis not present

## 2021-09-30 DIAGNOSIS — H6123 Impacted cerumen, bilateral: Secondary | ICD-10-CM | POA: Diagnosis not present

## 2021-10-03 DIAGNOSIS — Z20822 Contact with and (suspected) exposure to covid-19: Secondary | ICD-10-CM | POA: Diagnosis not present

## 2021-10-05 DIAGNOSIS — Z20822 Contact with and (suspected) exposure to covid-19: Secondary | ICD-10-CM | POA: Diagnosis not present

## 2021-10-14 ENCOUNTER — Other Ambulatory Visit: Payer: Self-pay | Admitting: Unknown Physician Specialty

## 2021-10-14 DIAGNOSIS — R221 Localized swelling, mass and lump, neck: Secondary | ICD-10-CM

## 2021-10-22 ENCOUNTER — Ambulatory Visit
Admission: RE | Admit: 2021-10-22 | Discharge: 2021-10-22 | Disposition: A | Discharge status: Home / Self Care | Payer: Medicare Other | Source: Ambulatory Visit | Attending: Unknown Physician Specialty | Admitting: Unknown Physician Specialty

## 2021-10-22 ENCOUNTER — Other Ambulatory Visit: Payer: Self-pay | Admitting: Unknown Physician Specialty

## 2021-10-22 ENCOUNTER — Telehealth: Payer: Self-pay | Admitting: Internal Medicine

## 2021-10-22 DIAGNOSIS — R221 Localized swelling, mass and lump, neck: Secondary | ICD-10-CM

## 2021-10-22 NOTE — Telephone Encounter (Signed)
Spoke with pt's mother and scheduled  pt for tomorrow with Dr. Olivia Mackie.

## 2021-10-22 NOTE — Telephone Encounter (Signed)
Pt mom would like to be called because pt balance is off and pt mom can not wait until Tuesday for pt to be seen

## 2021-10-23 ENCOUNTER — Ambulatory Visit (INDEPENDENT_AMBULATORY_CARE_PROVIDER_SITE_OTHER): Payer: Medicare Other | Admitting: Internal Medicine

## 2021-10-23 ENCOUNTER — Encounter: Payer: Self-pay | Admitting: Internal Medicine

## 2021-10-23 ENCOUNTER — Ambulatory Visit
Admission: RE | Admit: 2021-10-23 | Discharge: 2021-10-23 | Disposition: A | Discharge status: Home / Self Care | Payer: Medicare Other | Source: Ambulatory Visit | Attending: Internal Medicine | Admitting: Internal Medicine

## 2021-10-23 VITALS — BP 114/82 | HR 63 | Temp 98.1°F | Resp 14 | Ht <= 58 in | Wt 142.8 lb

## 2021-10-23 DIAGNOSIS — I639 Cerebral infarction, unspecified: Secondary | ICD-10-CM

## 2021-10-23 DIAGNOSIS — R2689 Other abnormalities of gait and mobility: Secondary | ICD-10-CM

## 2021-10-23 DIAGNOSIS — R42 Dizziness and giddiness: Secondary | ICD-10-CM

## 2021-10-23 DIAGNOSIS — D72829 Elevated white blood cell count, unspecified: Secondary | ICD-10-CM

## 2021-10-23 DIAGNOSIS — Z8673 Personal history of transient ischemic attack (TIA), and cerebral infarction without residual deficits: Secondary | ICD-10-CM | POA: Insufficient documentation

## 2021-10-23 DIAGNOSIS — R519 Headache, unspecified: Secondary | ICD-10-CM | POA: Insufficient documentation

## 2021-10-23 DIAGNOSIS — G250 Essential tremor: Secondary | ICD-10-CM

## 2021-10-23 DIAGNOSIS — I6930 Unspecified sequelae of cerebral infarction: Secondary | ICD-10-CM | POA: Insufficient documentation

## 2021-10-23 IMAGING — MG DIGITAL SCREENING BILAT W/ TOMO W/ CAD
6 of 12 series · 6 of 36 positions shown · non-contrast
Comparison: Previous exam(s).

ACR Breast Density Category a: The breast tissue is almost entirely
fatty.

CLINICAL DATA: Screening.

EXAM:
DIGITAL SCREENING BILATERAL MAMMOGRAM WITH TOMO AND CAD

[R MLO synth-2D]
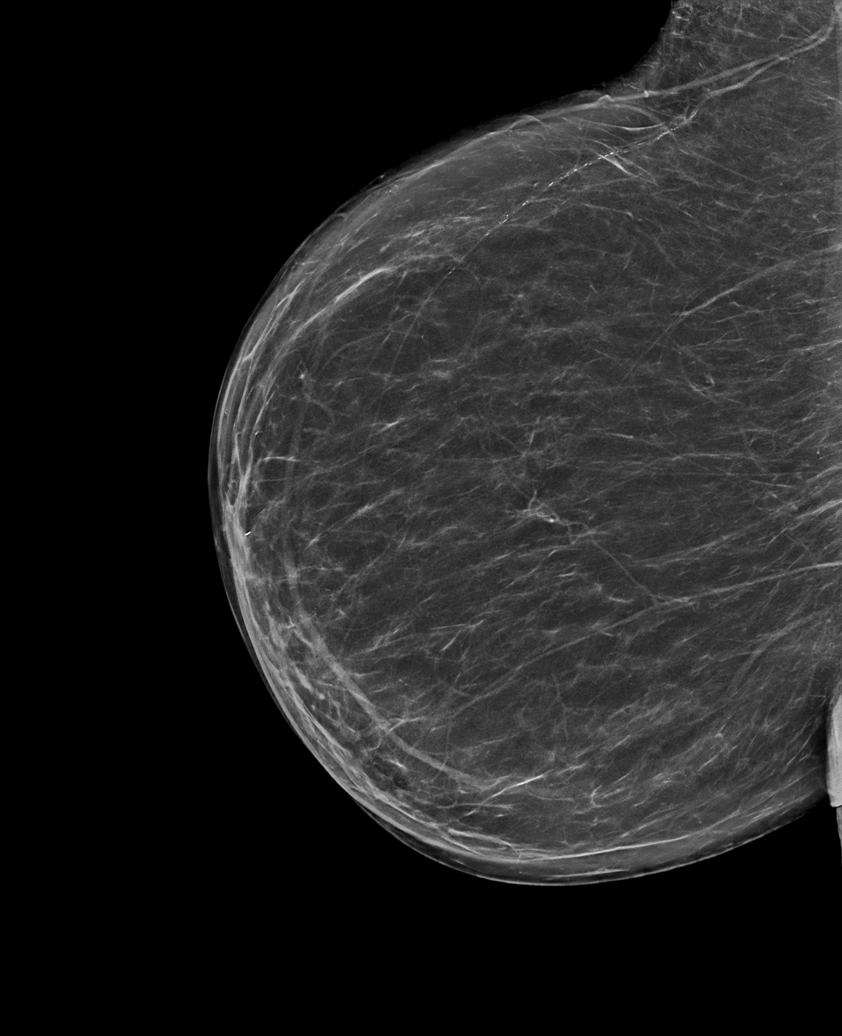

[L MLO synth-2D (1 of 2)]
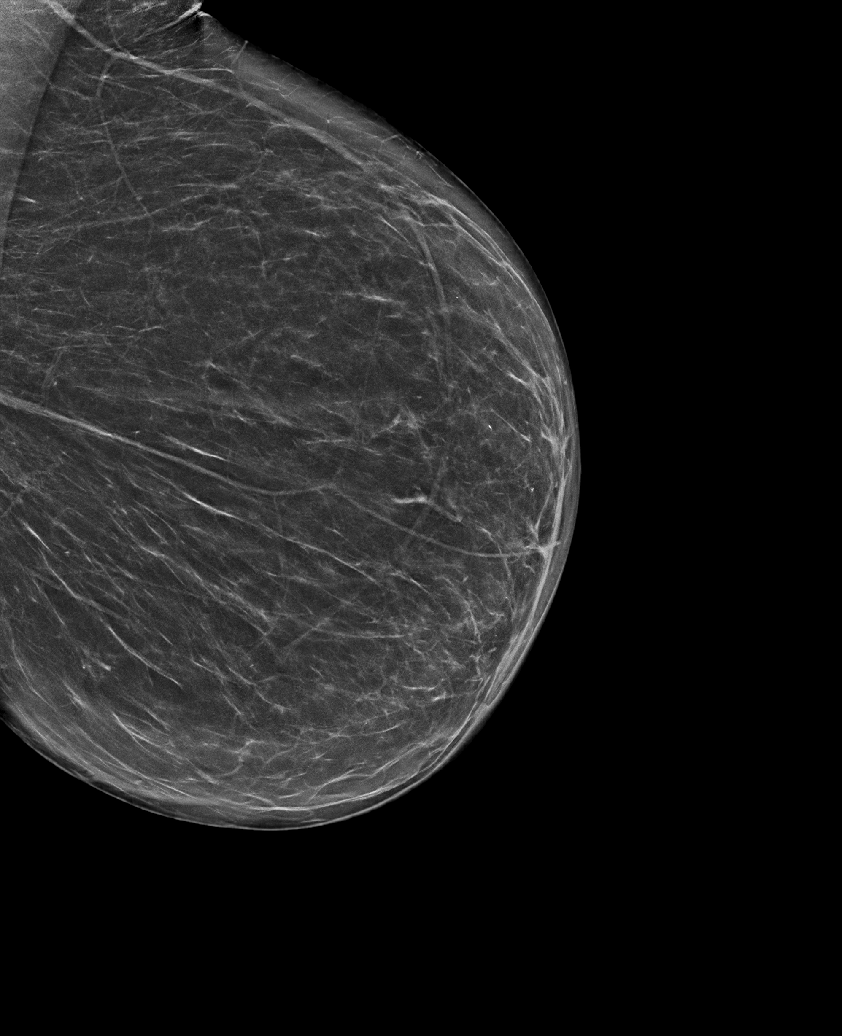

[L CC synth-2D]
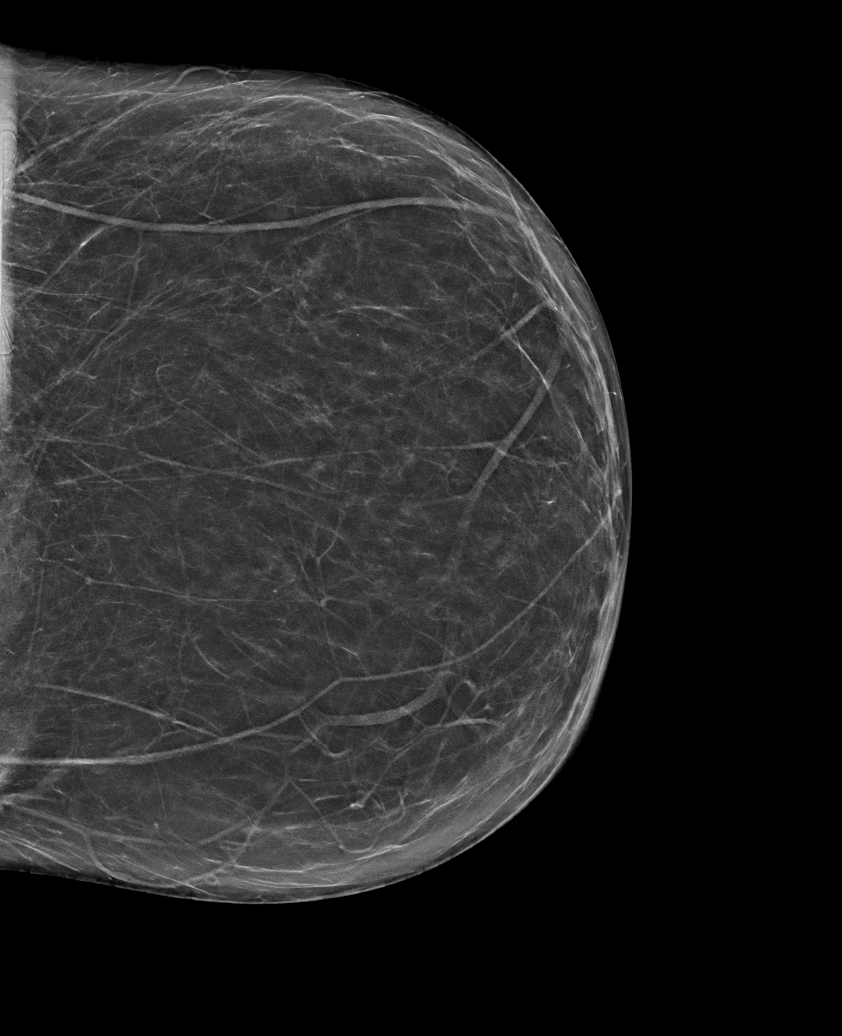

[L XCCL synth-2D]
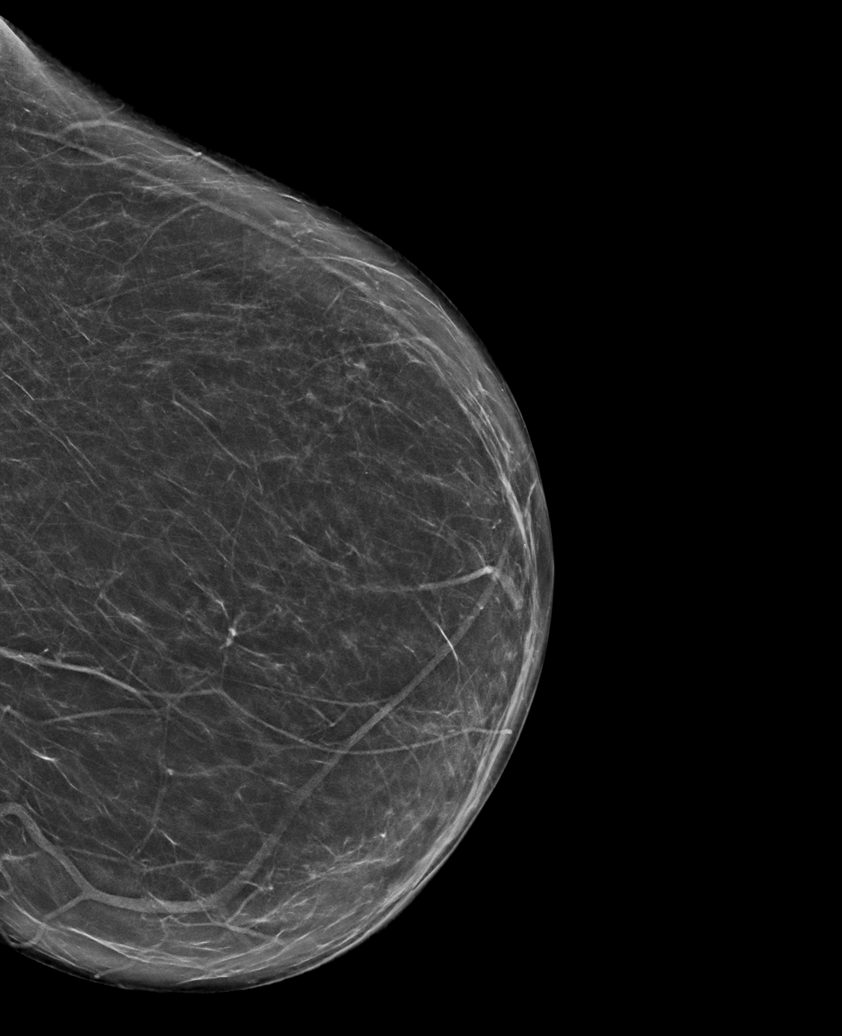

[R CC synth-2D]
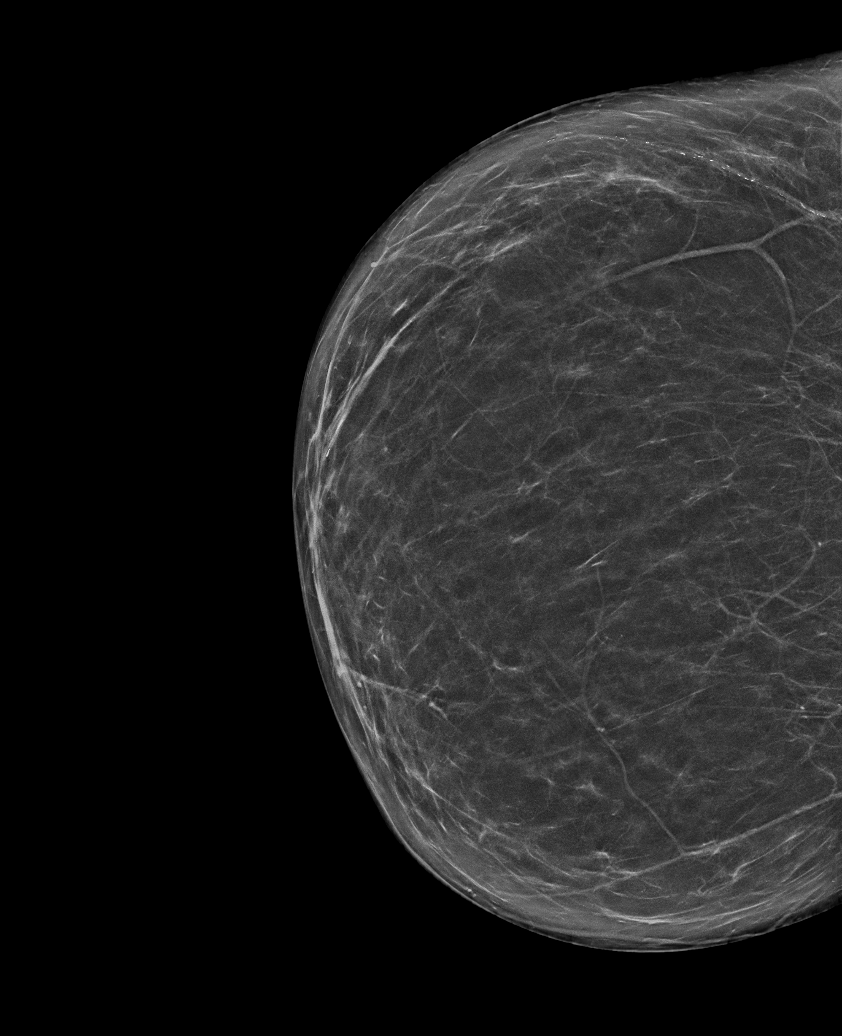

[L MLO synth-2D (2 of 2)]
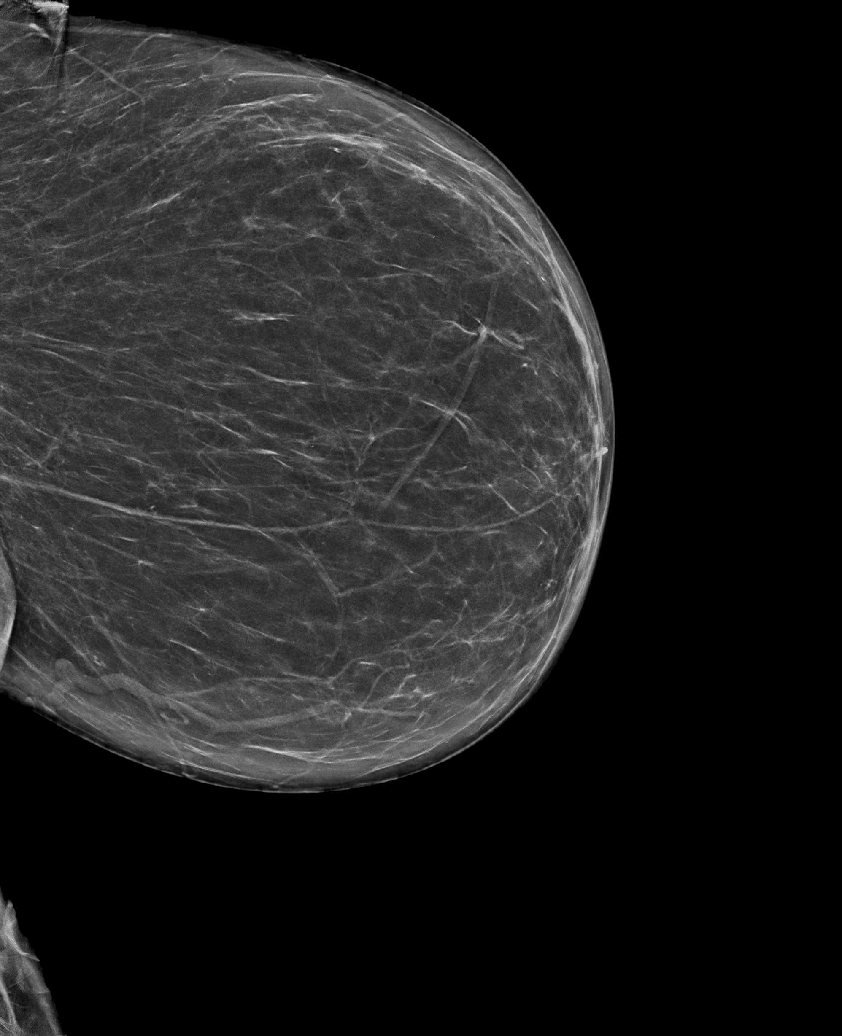

[6 of 36 positions shown; findings below may reference images not displayed]

FINDINGS: There are no findings suspicious for malignancy. Images were
processed with CAD.
IMPRESSION: No mammographic evidence of malignancy. A result letter of this
screening mammogram will be mailed directly to the patient.

RECOMMENDATION:
Screening mammogram in one year. (Code:[id number]-[id number])

BI-RADS CATEGORY  1: Negative.

## 2021-10-23 MED ORDER — ACETAMINOPHEN ER 650 MG PO TBCR: 650.0000 mg | EXTENDED_RELEASE_TABLET | Freq: Three times a day (TID) | ORAL | 2 refills | Status: DC | PRN

## 2021-10-23 NOTE — Patient Instructions (Addendum)
Tylenol can try for pain I.e headache as needed up to 3x per day  [facility] outpatient imaging [address], [place], [place] [address] Hours:  Phone: [phone] Dizziness Dizziness is a common problem. It is a feeling of unsteadiness or light-headedness. You may feel like you are about to faint. Dizziness can lead to injury if you stumble or fall. Anyone can become dizzy, but dizziness is more common in older adults. This condition can be caused by a number of things, including medicines, dehydration, or illness. Follow these instructions at home: Eating and drinking  Drink enough fluid to keep your urine pale yellow. This helps to keep you from becoming dehydrated. Try to drink more clear fluids, such as water. Do not drink alcohol. Limit your caffeine intake if told to do so by your health care provider. Check ingredients and nutrition facts to see if a food or beverage contains caffeine. Limit your salt (sodium) intake if told to do so by your health care provider. Check ingredients and nutrition facts to see if a food or beverage contains sodium. Activity  Avoid making quick movements. Rise slowly from chairs and steady yourself until you feel okay. In the morning, first sit up on the side of the bed. When you feel okay, stand slowly while you hold onto something until you know that your balance is good. If you need to stand in one place for a long time, move your legs often. Tighten and relax the muscles in your legs while you are standing. Do not drive or use machinery if you feel dizzy. Avoid bending down if you feel dizzy. Place items in your home so that they are easy for you to reach without leaning over. Lifestyle Do not use any products that contain nicotine or tobacco. These products include cigarettes, chewing tobacco, and vaping devices, such as e-cigarettes. If you need help quitting, ask your health care provider. Try to reduce your stress level by using methods such  as yoga or meditation. Talk with your health care provider if you need help to manage your stress. General instructions Watch your dizziness for any changes. Take over-the-counter and prescription medicines only as told by your health care provider. Talk with your health care provider if you think that your dizziness is caused by a medicine that you are taking. Tell a friend or a family member that you are feeling dizzy. If he or she notices any changes in your behavior, have this person call your health care provider. Keep all follow-up visits. This is important. Contact a health care provider if: Your dizziness does not go away or you have new symptoms. Your dizziness or light-headedness gets worse. You feel nauseous. You have reduced hearing. You have a fever. You have neck pain or a stiff neck. Your dizziness leads to an injury or a fall. Get help right away if: You vomit or have diarrhea and are unable to eat or drink anything. You have problems talking, walking, swallowing, or using your arms, hands, or legs. You feel generally weak. You have any bleeding. You are not thinking clearly or you have trouble forming sentences. It may take a friend or family member to notice this. You have chest pain, abdominal pain, shortness of breath, or sweating. Your vision changes or you develop a severe headache. These symptoms may represent a serious problem that is an emergency. Do not wait to see if the symptoms will go away. Get medical help right away. Call your local  emergency services (911 in the [place] Do not drive yourself to the hospital. Summary Dizziness is a feeling of unsteadiness or light-headedness. This condition can be caused by a number of things, including medicines, dehydration, or illness. Anyone can become dizzy, but dizziness is more common in older adults. Drink enough fluid to keep your urine pale yellow. Do not drink alcohol. Avoid making quick movements if you feel  dizzy. Monitor your dizziness for any changes. This information is not intended to replace advice given to you by your health care provider. Make sure you discuss any questions you have with your health care provider. Document Revised: 04/21/2020 Document Reviewed: 04/21/2020 Elsevier Patient Education  [place] Headache Without Cause A headache is pain or discomfort felt around the head or neck area. There are many causes and types of headaches. A few common types include: Tension headaches. Migraine headaches. Cluster headaches. Chronic daily headaches. Sometimes, the specific cause of a headache may not be found. Follow these instructions at home: Watch your condition for any changes. Let your health care provider know about them. Take these steps to help with your condition: Managing pain     Take over-the-counter and prescription medicines only as told by your health care provider. Treatment may include medicines for pain that are taken by mouth or applied to the skin. Lie down in a dark, quiet room when you have a headache. Keep lights dim if bright lights bother you or make your headaches worse. If directed, put ice on your head and neck area: Put ice in a plastic bag. Place a towel between your skin and the bag. Leave the ice on for 20 minutes, 2-3 times per day. Remove the ice if your skin turns bright red. This is very important. If you cannot feel pain, heat, or cold, you have a greater risk of damage to the area. If directed, apply heat to the affected area. Use the heat source that your health care provider recommends, such as a moist heat pack or a heating pad. Place a towel between your skin and the heat source. Leave the heat on for 20-30 minutes. Remove the heat if your skin turns bright red. This is especially important if you are unable to feel pain, heat, or cold. You have a greater risk of getting burned. Eating and drinking Eat meals on a  regular schedule. If you drink alcohol: Limit how much you have to: 0-1 drink a day for women who are not pregnant. 0-2 drinks a day for men. Know how much alcohol is in a drink. In the [place] one drink equals one 12 oz bottle of beer (355 mL), one 5 oz glass of wine (148 mL), or one 1 oz glass of hard liquor (44 mL). Stop drinking caffeine, or decrease the amount of caffeine you drink. Drink enough fluid to keep your urine pale yellow. General instructions  Keep a headache journal to help find out what may trigger your headaches. For example, write down: What you eat and drink. How much sleep you get. Any change to your diet or medicines. Try massage or other relaxation techniques. Limit stress. Sit up straight, and do not tense your muscles. Do not use any products that contain nicotine or tobacco. These products include cigarettes, chewing tobacco, and vaping devices, such as e-cigarettes. If you need help quitting, ask your health care provider. Exercise regularly as told by your health care provider. Sleep on a regular schedule. Get 7-9 hours of  sleep each night, or the amount recommended by your health care provider. Keep all follow-up visits. This is important. Contact a health care provider if: Medicine does not help your symptoms. You have a headache that is different from your usual headache. You have nausea or you vomit. You have a fever. Get help right away if: Your headache: Becomes severe quickly. Gets worse after moderate to intense physical activity. You have any of these symptoms: Repeated vomiting. Pain or stiffness in your neck. Changes to your vision. Pain in an eye or ear. Problems with speech. Muscular weakness or loss of muscle control. Loss of balance or coordination. You feel faint or pass out. You have confusion. You have a seizure. These symptoms may represent a serious problem that is an emergency. Do not wait to see if the symptoms will go away.  Get medical help right away. Call your local emergency services (911 in the [place] Do not drive yourself to the hospital. Summary A headache is pain or discomfort felt around the head or neck area. There are many causes and types of headaches. In some cases, the cause may not be found. Keep a headache journal to help find out what may trigger your headaches. Watch your condition for any changes. Let your health care provider know about them. Contact a health care provider if you have a headache that is different from the usual headache, or if your symptoms are not helped by medicine. Get help right away if your headache becomes severe, you vomit, you have a loss of vision, you lose your balance, or you have a seizure. This information is not intended to replace advice given to you by your health care provider. Make sure you discuss any questions you have with your health care provider. Document Revised: 10/15/2020 Document Reviewed: 10/15/2020 Elsevier Patient Education  [place].

## 2021-10-23 NOTE — Progress Notes (Signed)
Chief Complaint  Patient presents with   Balance Issues    Pt's mom states pt has been having issues with her balance x1 wk during walking and is constant. Pt c/o dizziness,lightheadedness and head shaking mom states she had EEG years ago and was normal.   F/u  1. Headache temporal b/l and dizziness x 1 week during walking and off balance constant having to hold onto mom to walk has walker not using feeling lightheaded , head tremor for a while prior EEG normal negative   Sees Dr. Tami Ribas Thursday and 2 weeks before for wax excess and removal goes every 6 months    Review of Systems  Constitutional:  Negative for weight loss.  HENT:  Negative for hearing loss.   Eyes:  Negative for blurred vision.  Respiratory:  Negative for shortness of breath.   Cardiovascular:  Negative for chest pain.  Gastrointestinal:  Negative for abdominal pain and blood in stool.  Genitourinary:  Negative for dysuria.  Musculoskeletal:  Negative for falls and joint pain.  Skin:  Negative for rash.  Neurological:  Positive for dizziness and headaches.  Psychiatric/Behavioral:  Negative for depression.   Past Medical History:  Diagnosis Date   Arthritis    Asthma    unspecified   Blood clot in vein    Chicken pox    Colitis    Collagen vascular disease ([place])    COPD (chronic obstructive pulmonary disease) ([place])    COVID-19 virus detected 04/30/2019   Nov 23 tested positive,  LRI symptoms    Deafness in left ear    Disease of thyroid gland    Down syndrome    DVT (deep venous thrombosis) ([facility])    Fractures    GERD (gastroesophageal reflux disease)    Hypothyroidism    unspecified   Inflammatory arthritis 01/30/2014   a. Positive anti-CCP antibodies, negative rheumatoid factor, positive FANA. b. Methotrexate, Prednisone, Plaquenil.    Joint pain    Lupus ([place]) 1995   Osteoarthritis 01/30/2014   a. Lumbar disc disease. b. Trigger nodules   Osteoporosis 01/30/2014   a. 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 Rayann Heman   COLONOSCOPY  09/24/2003   Dr. Brita Romp   FOOT SURGERY Bilateral    x2   [place] Left 01/10/2017   Procedure: COMPUTER ASSISTED TOTAL KNEE ARTHROPLASTY;  Surgeon: Dereck Leep, MD;  Location: [facility] ORS;  Service: Orthopedics;  Laterality: Left;   KNEE ARTHROPLASTY Right 08/10/2017   Procedure: COMPUTER ASSISTED TOTAL KNEE ARTHROPLASTY;  Surgeon: Dereck Leep, MD;  Location: [facility] ORS;  Service: Orthopedics;  Laterality: Right;   Family History  Problem Relation Age of Onset   Heart disease Mother    Diabetes Mother    Breast cancer Mother 80   Heart disease Father    Diabetes Father    Alcohol abuse Maternal Grandmother    Arthritis Maternal Grandmother    Stroke Maternal Grandmother    Diabetes Maternal Grandmother    Alcohol abuse Maternal Grandfather    Arthritis Maternal Grandfather  Stroke Maternal Grandfather    Diabetes Maternal Grandfather    Social History   Socioeconomic History   Marital status: Single    Spouse name: Not on file   Number of children: 0   Years of education: Not on file   Highest education level: Not on file  Occupational History   Not on file  Tobacco Use   Smoking status: Never   Smokeless tobacco: Never  Vaping Use   Vaping Use: Never used  Substance and Sexual Activity   Alcohol use: No   Drug use: No   Sexual activity: Not on file  Other Topics Concern   Not on file  Social History Narrative   Lives at [facility] group home.   Social Determinants of Health   Financial Resource Strain: Not on  file  Food Insecurity: Not on file  Transportation Needs: Not on file  Physical Activity: Not on file  Stress: Not on file  Social Connections: Not on file  Intimate Partner Violence: Not on file   Current Meds  Medication Sig   acetaminophen (TYLENOL 8 HOUR) 650 MG CR tablet Take 1 tablet (650 mg total) by mouth every 8 (eight) hours as needed for pain. headache   alendronate (FOSAMAX) 70 MG tablet Take 70 mg by mouth once a week. Take with a full glass of water on an empty stomach.   amoxicillin (AMOXIL) 500 MG capsule TAKE 4 CAPSULES (2000MG) BY MOUTH AT ONCE 1-HR PRIOR TO DENTAL APPT   amoxicillin-clavulanate (AUGMENTIN) 875-125 MG tablet Take 1 tablet by mouth 2 (two) times daily.   ARTHRITIS PAIN RELIEVER 1 % GEL APPLY 2 G TOPICALLY 4 (FOUR) TIMES DAILY AS NEEDED FOR JOINT PAIN.   atorvastatin (LIPITOR) 20 MG tablet TAKE 1 TABLET BY MOUTH ONCE DAILY   [place] Saline Nasal No-Drip GEL INSTILL INTO NOSE TWICE DAILY TO MOISTURIZE LINING OF NOSE;INSTILL INTO NOSE TWICE A DAY AS NEEDED FOR DRY NOSE   azelastine (OPTIVAR) 0.05 % ophthalmic solution PLACE 1 DROP EACH EYE TWICE DAILY *WAIT 3-5 MINUTES BETWEEN 2 EYE MEDS* *STORE UPRIGHT*   BLUE GEL 2 % GEL APPLY TOPICALLY TO PAINFUL JOINTS 2 TO 3 TIMES DAILY AS NEEDED   cholecalciferol (VITAMIN D) 25 MCG (1000 UNIT) tablet TAKE 1 TABLET BY MOUTH ONCE DAILY FOR SUPPLEMENT   clotrimazole-betamethasone (LOTRISONE) cream APPLY TOPICALLY 2 TIMES PER DAY   cyanocobalamin (,VITAMIN B-12,) 1000 MCG/ML injection INJECT 1ML=1000MCG INTRAMUSCULARLY EVERY MONTH ON THE 30TH   DELSYM 30 MG/5ML liquid TAKE 1 TEASPOONFUL BY MOUTH FOUR TIMES A DAY AS NEEDED *SHAKE WELL*   dextromethorphan-guaiFENesin (MUCINEX DM) 30-600 MG 12hr tablet Take 1 tablet by mouth 2 (two) times daily as needed for cough.   docusate sodium (COLACE) 100 MG capsule TAKE 1 CAPSULE BY MOUTH EVERY OTHER DAY *DO NOT CRUSH OR CHEW*   famotidine (PEPCID) 20 MG tablet One tablet daily with dinner    fluticasone (FLONASE) 50 MCG/ACT nasal spray USE 2 SPRAYS EACH NOSTRIL ONCE DAILY *SHAKE GENTLY*   folic acid (FOLVITE) 1 MG tablet TAKE 1 TABLET BY MOUTH EVERY DAY   GUAIATUSSIN AC 100-10 MG/5ML syrup TAKE 5MLS BY MOUTH THREE TIMES A DAY AS NEEDED FOR COUGH   ipratropium (ATROVENT) 0.02 % nebulizer solution INHALE 1 VIAL VIA NEBULIZER FOUR TIMES A DAY AS NEEDED FOR WHEEZING OR COUGH   ketoconazole (NIZORAL) 2 % cream APPLY TOIPICALLY ONCE DAILY   levalbuterol (XOPENEX) 0.63 MG/3ML nebulizer solution Take 3 mLs (0.63 mg total)  by nebulization every 6 (six) hours as needed for wheezing or shortness of breath.   levothyroxine (SYNTHROID) 112 MCG tablet Take 1 tablet (112 mcg total) by mouth daily.   methotrexate (RHEUMATREX) 2.5 MG tablet TAKE 7 TABLETS (17.5 MG) BY MOUTH ONCE A WEEK ON FRIDAY *CAUTION: CHEMOTHERAPY. PROTECT FROM LIGHT* *NOTE DOSE AND FREQUENCY* *HAZARDOUS DRUG: WEAR GLOVES* *DO NOT CF*   minocycline (MINOCIN) 100 MG capsule Take 1 capsule (100 mg total) by mouth 2 (two) times daily.   montelukast (SINGULAIR) 10 MG tablet TAKE 1 TABLET BY MOUTH AT BEDTIME   Multiple Vitamins-Minerals (THEREMS-M) TABS TAKE 1 TABLET BY MOUTH EVERY DAY FOR SUPPLEMENT   omeprazole (PRILOSEC) 20 MG capsule TAKE 1 CAPSULE BY MOUTH TWICE DAILY *DO NOT CRUSH OR CHEW*   OYSCO 500 + D 500-5 MG-MCG TABS TAKE 1 TABLET  BY MOUTH ONCE DAILY FOR SUPPLEMENT   predniSONE (DELTASONE) 2.5 MG tablet TAKE 1 TABLET BY MOUTH EACH DAY   predniSONE (STERAPRED UNI-PAK 21 TAB) 10 MG (21) TBPK tablet PO: Take 6 tablets on day 1:Take 5 tablets day 2:Take 4 tablets day 3: Take 3 tablets day 4:Take 2 tablets day five: 5 Take 1 tablet day 6   Probiotic Product (ALIGN) 4 MG CAPS 1 tablet daily for 4 weeks   senna (SENOKOT) 8.6 MG TABS tablet TAKE 2 TABLETS BY MOUTH AT BEDTIME   SYMBICORT 160-4.5 MCG/ACT inhaler Inhale into the lungs.   SYRINGE-NEEDLE, DISP, 3 ML (BD ECLIPSE SYRINGE) 25G X 1" 3 ML MISC FOR USE WITH CYANOCOBALAMIN    traMADol (ULTRAM) 50 MG tablet TAKE 1 TABLET BY MOUTH DAILY AS NEEDED FOR MODERATE PAIN   Allergies  Allergen Reactions   Erythromycin Shortness Of Breath, Rash and Other (See Comments)    Reaction:  Unknown     Oxycontin [Oxycodone]    Tramadol Nausea And Vomiting   Albuterol Rash and Other (See Comments)    "Jittery"    No results found for this or any previous visit (from the past 2160 hour(s)). Objective  Body mass index is 32.02 kg/m. Wt Readings from Last 3 Encounters:  10/23/21 142 lb 12.8 oz (64.8 kg)  06/10/21 143 lb 12.8 oz (65.2 kg)  06/04/21 144 lb 12.8 oz (65.7 kg)   Temp Readings from Last 3 Encounters:  10/23/21 98.1 F (36.7 C) (Oral)  06/10/21 98.5 F (36.9 C) (Oral)  05/28/21 98.2 F (36.8 C)   BP Readings from Last 3 Encounters:  10/23/21 114/82  06/10/21 120/80  06/04/21 110/70   Pulse Readings from Last 3 Encounters:  10/23/21 63  06/10/21 91  06/04/21 64    Physical Exam Vitals and nursing note reviewed.  Constitutional:      Appearance: Normal appearance. She is well-developed and well-groomed.  HENT:     Head: Normocephalic and atraumatic.  Eyes:     Conjunctiva/sclera: Conjunctivae normal.     Pupils: Pupils are equal, round, and reactive to light.  Cardiovascular:     Rate and Rhythm: Normal rate and regular rhythm.     Heart sounds: Normal heart sounds. No murmur heard. Pulmonary:     Effort: Pulmonary effort is normal.     Breath sounds: Normal breath sounds.  Abdominal:     General: Abdomen is flat. Bowel sounds are normal.     Tenderness: There is no abdominal tenderness.  Musculoskeletal:        General: No tenderness.  Skin:    General: Skin is warm and dry.  Neurological:  General: No focal deficit present.     Mental Status: She is alert and oriented to person, place, and time. Mental status is at baseline.     Cranial Nerves: Cranial nerves 2-12 are intact.     Motor: Motor function is intact.      Coordination: Coordination is intact.     Gait: Gait is intact.  Psychiatric:        Attention and Perception: Attention and perception normal.        Mood and Affect: Mood and affect normal.        Speech: Speech normal.        Behavior: Behavior normal. Behavior is cooperative.        Thought Content: Thought content normal.        Cognition and Memory: Cognition and memory normal.        Judgment: Judgment normal.    Assessment  Plan  Balance abnormal  Benign head tremor Dizziness - Plan: CT HEAD WO CONTRAST (5MM) + stroke remote  IMPRESSION: 1. 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 2. No acute intracranial abnormality. Electronically Signed   By: San Morelle M.D.   On: 10/23/2021 12:42 Referred neurology [facility]  Referred [facility] cardiology  Intractable headache, unspecified chronicity pattern, unspecified headache type - Plan: CT HEAD WO CONTRAST (5MM), acetaminophen (TYLENOL 8 HOUR) 650 MG CR tablet tid prn Balance problem - Plan: CT HEAD WO CONTRAST (5MM)  Consider vestibular PT with Dr. Tami Ribas  Referred neurology [place]  Referred [facility] cardiology  Orthostatics neg today lying 107/73 hr 68 dizzy, sitting 116/80 hr 66, standing 104/71 hr 78  Negative   Provider: Dr. Olivia Mackie McLean-Scocuzza-Internal Medicine

## 2021-10-29 DIAGNOSIS — R29898 Other symptoms and signs involving the musculoskeletal system: Secondary | ICD-10-CM | POA: Diagnosis not present

## 2021-10-29 DIAGNOSIS — R251 Tremor, unspecified: Secondary | ICD-10-CM | POA: Diagnosis not present

## 2021-10-29 DIAGNOSIS — R2689 Other abnormalities of gait and mobility: Secondary | ICD-10-CM | POA: Diagnosis not present

## 2021-10-29 DIAGNOSIS — R42 Dizziness and giddiness: Secondary | ICD-10-CM | POA: Diagnosis not present

## 2021-10-29 DIAGNOSIS — R262 Difficulty in walking, not elsewhere classified: Secondary | ICD-10-CM | POA: Diagnosis not present

## 2021-11-02 ENCOUNTER — Encounter: Payer: Self-pay | Admitting: Diagnostic Neuroimaging

## 2021-11-02 ENCOUNTER — Ambulatory Visit: Payer: Medicare Other | Admitting: Diagnostic Neuroimaging

## 2021-11-11 ENCOUNTER — Other Ambulatory Visit: Payer: Self-pay | Admitting: Internal Medicine

## 2021-11-11 DIAGNOSIS — Z1231 Encounter for screening mammogram for malignant neoplasm of breast: Secondary | ICD-10-CM

## 2021-11-17 DIAGNOSIS — M25871 Other specified joint disorders, right ankle and foot: Secondary | ICD-10-CM | POA: Diagnosis not present

## 2021-11-17 DIAGNOSIS — M2041 Other hammer toe(s) (acquired), right foot: Secondary | ICD-10-CM | POA: Diagnosis not present

## 2021-11-17 DIAGNOSIS — M79674 Pain in right toe(s): Secondary | ICD-10-CM | POA: Diagnosis not present

## 2021-11-17 DIAGNOSIS — B353 Tinea pedis: Secondary | ICD-10-CM | POA: Diagnosis not present

## 2021-11-17 DIAGNOSIS — M79675 Pain in left toe(s): Secondary | ICD-10-CM | POA: Diagnosis not present

## 2021-11-17 DIAGNOSIS — M2042 Other hammer toe(s) (acquired), left foot: Secondary | ICD-10-CM | POA: Diagnosis not present

## 2021-11-17 DIAGNOSIS — B351 Tinea unguium: Secondary | ICD-10-CM | POA: Diagnosis not present

## 2021-11-17 DIAGNOSIS — L600 Ingrowing nail: Secondary | ICD-10-CM | POA: Diagnosis not present

## 2021-11-20 ENCOUNTER — Ambulatory Visit (INDEPENDENT_AMBULATORY_CARE_PROVIDER_SITE_OTHER): Payer: Medicare Other | Admitting: Internal Medicine

## 2021-11-20 ENCOUNTER — Encounter: Payer: Self-pay | Admitting: Internal Medicine

## 2021-11-20 VITALS — BP 88/60 | HR 76 | Temp 98.2°F | Ht <= 58 in | Wt 142.0 lb

## 2021-11-20 DIAGNOSIS — R2689 Other abnormalities of gait and mobility: Secondary | ICD-10-CM

## 2021-11-20 DIAGNOSIS — I639 Cerebral infarction, unspecified: Secondary | ICD-10-CM | POA: Diagnosis not present

## 2021-11-20 DIAGNOSIS — R262 Difficulty in walking, not elsewhere classified: Secondary | ICD-10-CM | POA: Diagnosis not present

## 2021-11-20 DIAGNOSIS — Z8673 Personal history of transient ischemic attack (TIA), and cerebral infarction without residual deficits: Secondary | ICD-10-CM | POA: Diagnosis not present

## 2021-11-20 DIAGNOSIS — I700 Atherosclerosis of aorta: Secondary | ICD-10-CM | POA: Diagnosis not present

## 2021-11-20 MED ORDER — SERTRALINE HCL 25 MG PO TABS: 25.0000 mg | ORAL_TABLET | Freq: Every day | ORAL | 3 refills | Status: DC

## 2021-11-22 NOTE — Assessment & Plan Note (Addendum)
Reassured that the infarct seen on the recent head CT was remote and not the cause of her current leg weakness.  81 mg aspirin daily advised.

## 2021-11-22 NOTE — Assessment & Plan Note (Signed)
Secondary to proximal leg weakness  With history of RA and s/p left TKR. Marland Kitchen  Referring tp PT.  Continue use of walker

## 2021-12-03 DIAGNOSIS — R262 Difficulty in walking, not elsewhere classified: Secondary | ICD-10-CM | POA: Diagnosis not present

## 2021-12-03 DIAGNOSIS — R2689 Other abnormalities of gait and mobility: Secondary | ICD-10-CM | POA: Diagnosis not present

## 2021-12-03 DIAGNOSIS — R29898 Other symptoms and signs involving the musculoskeletal system: Secondary | ICD-10-CM | POA: Diagnosis not present

## 2021-12-03 DIAGNOSIS — R251 Tremor, unspecified: Secondary | ICD-10-CM | POA: Diagnosis not present

## 2021-12-07 ENCOUNTER — Other Ambulatory Visit: Payer: Self-pay | Admitting: Internal Medicine

## 2021-12-08 DIAGNOSIS — R0602 Shortness of breath: Secondary | ICD-10-CM | POA: Diagnosis not present

## 2021-12-08 DIAGNOSIS — R9431 Abnormal electrocardiogram [ECG] [EKG]: Secondary | ICD-10-CM | POA: Diagnosis not present

## 2021-12-08 DIAGNOSIS — I63321 Cerebral infarction due to thrombosis of right anterior cerebral artery: Secondary | ICD-10-CM | POA: Diagnosis not present

## 2021-12-08 DIAGNOSIS — I639 Cerebral infarction, unspecified: Secondary | ICD-10-CM | POA: Diagnosis not present

## 2021-12-08 DIAGNOSIS — I63423 Cerebral infarction due to embolism of bilateral anterior cerebral arteries: Secondary | ICD-10-CM | POA: Diagnosis not present

## 2021-12-14 ENCOUNTER — Ambulatory Visit: Payer: Medicare Other | Admitting: Diagnostic Neuroimaging

## 2021-12-17 ENCOUNTER — Telehealth: Payer: Self-pay | Admitting: Internal Medicine

## 2021-12-17 ENCOUNTER — Encounter: Payer: Self-pay | Admitting: Internal Medicine

## 2021-12-17 ENCOUNTER — Ambulatory Visit (INDEPENDENT_AMBULATORY_CARE_PROVIDER_SITE_OTHER): Payer: Medicare Other | Admitting: Internal Medicine

## 2021-12-17 VITALS — BP 108/72 | HR 69 | Temp 97.9°F | Ht <= 58 in | Wt 133.8 lb

## 2021-12-17 DIAGNOSIS — E875 Hyperkalemia: Secondary | ICD-10-CM | POA: Diagnosis not present

## 2021-12-17 DIAGNOSIS — E538 Deficiency of other specified B group vitamins: Secondary | ICD-10-CM | POA: Diagnosis not present

## 2021-12-17 DIAGNOSIS — Z8673 Personal history of transient ischemic attack (TIA), and cerebral infarction without residual deficits: Secondary | ICD-10-CM | POA: Diagnosis not present

## 2021-12-17 DIAGNOSIS — R634 Abnormal weight loss: Secondary | ICD-10-CM

## 2021-12-17 DIAGNOSIS — D518 Other vitamin B12 deficiency anemias: Secondary | ICD-10-CM

## 2021-12-17 DIAGNOSIS — R7301 Impaired fasting glucose: Secondary | ICD-10-CM | POA: Diagnosis not present

## 2021-12-17 DIAGNOSIS — Z23 Encounter for immunization: Secondary | ICD-10-CM | POA: Diagnosis not present

## 2021-12-17 DIAGNOSIS — F411 Generalized anxiety disorder: Secondary | ICD-10-CM

## 2021-12-17 DIAGNOSIS — I639 Cerebral infarction, unspecified: Secondary | ICD-10-CM

## 2021-12-17 LAB — COMPREHENSIVE METABOLIC PANEL
ALT: 16 U/L (ref 0–35)
AST: 28 U/L (ref 0–37)
Albumin: 3.9 g/dL (ref 3.5–5.2)
Alkaline Phosphatase: 58 U/L (ref 39–117)
BUN: 12 mg/dL (ref 6–23)
CO2: 32 mEq/L (ref 19–32)
Calcium: 8.9 mg/dL (ref 8.4–10.5)
Chloride: 103 mEq/L (ref 96–112)
Creatinine, Ser: 0.84 mg/dL (ref 0.40–1.20)
GFR: 79.39 mL/min (ref >60.00)
Glucose, Bld: 96 mg/dL (ref 70–99)
Potassium: 5.2 mEq/L — ABNORMAL HIGH (ref 3.5–5.1)
Sodium: 143 mEq/L (ref 135–145)
Total Bilirubin: 0.4 mg/dL (ref 0.2–1.2)
Total Protein: 6.3 g/dL (ref 6.0–8.3)

## 2021-12-17 LAB — CBC WITH DIFFERENTIAL/PLATELET
Basophils Absolute: 0.1 10*3/uL (ref 0.0–0.1)
Basophils Relative: 1 % (ref 0.0–3.0)
Eosinophils Absolute: 0.1 10*3/uL (ref 0.0–0.7)
Eosinophils Relative: 1.1 % (ref 0.0–5.0)
HCT: 38.3 % (ref 36.0–46.0)
Hemoglobin: 12.6 g/dL (ref 12.0–15.0)
Lymphocytes Relative: 11.3 % — ABNORMAL LOW (ref 12.0–46.0)
Lymphs Abs: 0.8 10*3/uL (ref 0.7–4.0)
MCHC: 33 g/dL (ref 30.0–36.0)
MCV: 110 fl — ABNORMAL HIGH (ref 78.0–100.0)
Monocytes Absolute: 0.5 10*3/uL (ref 0.1–1.0)
Monocytes Relative: 6.9 % (ref 3.0–12.0)
Neutro Abs: 5.7 10*3/uL (ref 1.4–7.7)
Neutrophils Relative %: 79.7 % — ABNORMAL HIGH (ref 43.0–77.0)
Platelets: 296 10*3/uL (ref 150.0–400.0)
RBC: 3.48 Mil/uL — ABNORMAL LOW (ref 3.87–5.11)
RDW: 14.5 % (ref 11.5–15.5)
WBC: 7.1 10*3/uL (ref 4.0–10.5)

## 2021-12-17 LAB — TSH: TSH: 2.25 u[IU]/mL (ref 0.35–5.50)

## 2021-12-17 LAB — HEMOGLOBIN A1C: Hgb A1c MFr Bld: 5.7 % (ref 4.6–6.5)

## 2021-12-17 NOTE — Telephone Encounter (Signed)
Called Pt mother/No Answer/[facility] office/Schedule pt for 3 months follow per Dr. Derrel Nip.Marland KitchenMarland Kitchen

## 2021-12-17 NOTE — Patient Instructions (Addendum)
I would not worry about the weight loss.    She is obese and needs to lose weight.    The facility may be implementing a healthier diet for the residents!  I would have the facility weigh her once a week   Checking thyroid function etc, today

## 2021-12-17 NOTE — Progress Notes (Signed)
Subjective:  Patient ID: Cindy Oconnor, female    DOB: 1971-05-09  Age: 51 y.o. MRN: [id number]  CC: The primary encounter diagnosis was Weight loss, unintentional. Diagnoses of Impaired fasting glucose, Unintentional weight loss of 5% body weight or less within 1 month, Hyperkalemia, B12 deficiency, Macrocytic anemia with vitamin B12 deficiency, History of CVA (cerebrovascular accident) without residual deficits, and Generalized anxiety disorder were also pertinent to this visit.   HPI Cindy Oconnor presents for follow up on anxiety after sertraline start.   Chief Complaint  Patient presents with   Follow-up    Anxiety   Seen 4 weeks ago.   Tolerating low dose sertraline  25 mg daily.  Mother thinks her anxiety is well controlled.   Wt loss of 10 lbs , based on weights in office.  mother is  concerned.  Body mass index is 30 kg/m.   Patient lives in a group home ,  gets 3 meals daily along with 2 snacks. Appetite is good..  She cleans her plate.  Dietary choices are limited lots of southernfood,   includes hamburgers and fries once a week,  fried chicken , collard greens,  sauerkraut.    Dizziness:  workup started by TM has led to neurology and cardiology evaluations due to remote CVA seen on  MRI.    Has not started PT yet for balance.  Does not have any regular exercise at the group home and underwent knee replacement several months ago.    Outpatient Medications Prior to Visit  Medication Sig Dispense Refill   acetaminophen (TYLENOL 8 HOUR) 650 MG CR tablet Take 1 tablet (650 mg total) by mouth every 8 (eight) hours as needed for pain. headache 90 tablet 2   alendronate (FOSAMAX) 70 MG tablet Take 70 mg by mouth once a week. Take with a full glass of water on an empty stomach.     ARTHRITIS PAIN RELIEVER 1 % GEL APPLY 2 G TOPICALLY 4 (FOUR) TIMES DAILY AS NEEDED FOR JOINT PAIN. 150 g 11   atorvastatin (LIPITOR) 20 MG tablet TAKE 1 TABLET BY MOUTH ONCE DAILY 7 tablet 11    Ayr Saline Nasal No-Drip GEL INSTILL INTO NOSE TWICE DAILY TO MOISTURIZE LINING OF NOSE;INSTILL INTO NOSE TWICE A DAY AS NEEDED FOR DRY NOSE 22 mL 11   azelastine (OPTIVAR) 0.05 % ophthalmic solution PLACE 1 DROP EACH EYE TWICE DAILY *WAIT 3-5 MINUTES BETWEEN 2 EYE MEDS* *STORE UPRIGHT* 6 mL 10   BLUE GEL 2 % GEL APPLY TOPICALLY TO PAINFUL JOINTS 2 TO 3 TIMES DAILY AS NEEDED 226.8 g 11   cholecalciferol (VITAMIN D) 25 MCG (1000 UNIT) tablet TAKE 1 TABLET BY MOUTH ONCE DAILY FOR SUPPLEMENT 7 tablet 11   clotrimazole-betamethasone (LOTRISONE) cream APPLY TOPICALLY 2 TIMES PER DAY 45 g 2   cyanocobalamin (,VITAMIN B-12,) 1000 MCG/ML injection INJECT 1ML=1000MCG INTRAMUSCULARLY EVERY MONTH ON THE 30TH 1 mL 11   dextromethorphan-guaiFENesin (MUCINEX DM) 30-600 MG 12hr tablet Take 1 tablet by mouth 2 (two) times daily as needed for cough. 20 tablet 0   docusate sodium (COLACE) 100 MG capsule TAKE 1 CAPSULE BY MOUTH EVERY OTHER DAY *DO NOT CRUSH OR CHEW* 3 capsule 11   famotidine (PEPCID) 20 MG tablet One tablet daily with dinner 90 tablet 1   fluticasone (FLONASE) 50 MCG/ACT nasal spray USE 2 SPRAYS EACH NOSTRIL ONCE DAILY *SHAKE GENTLY* 16 g 11   folic acid (FOLVITE) 1 MG tablet TAKE 1 TABLET BY MOUTH  EVERY DAY 30 tablet 2   ipratropium (ATROVENT) 0.02 % nebulizer solution INHALE 1 VIAL VIA NEBULIZER FOUR TIMES A DAY AS NEEDED FOR WHEEZING OR COUGH 150 mL 11   ketoconazole (NIZORAL) 2 % cream APPLY TOIPICALLY ONCE DAILY 60 g 10   levalbuterol (XOPENEX) 0.63 MG/3ML nebulizer solution Take 3 mLs (0.63 mg total) by nebulization every 6 (six) hours as needed for wheezing or shortness of breath. 180 mL 11   levothyroxine (SYNTHROID) 112 MCG tablet Take 1 tablet (112 mcg total) by mouth daily. 90 tablet 1   methotrexate (RHEUMATREX) 2.5 MG tablet TAKE 7 TABLETS (17.5 MG) BY MOUTH ONCE A WEEK ON FRIDAY *CAUTION: CHEMOTHERAPY. PROTECT FROM LIGHT* *NOTE DOSE AND FREQUENCY* *HAZARDOUS DRUG: WEAR GLOVES* *DO NOT CF*  7 tablet 11   minocycline (MINOCIN) 100 MG capsule Take 1 capsule (100 mg total) by mouth 2 (two) times daily. 20 capsule 0   montelukast (SINGULAIR) 10 MG tablet TAKE 1 TABLET BY MOUTH AT BEDTIME 7 tablet 11   Multiple Vitamins-Minerals (THEREMS-M) TABS TAKE 1 TABLET BY MOUTH EVERY DAY FOR SUPPLEMENT 90 tablet 1   omeprazole (PRILOSEC) 20 MG capsule TAKE 1 CAPSULE BY MOUTH TWICE DAILY *DO NOT CRUSH OR CHEW* 14 capsule 11   OYSCO 500 + D 500-5 MG-MCG TABS TAKE 1 TABLET  BY MOUTH ONCE DAILY FOR SUPPLEMENT 30 tablet 11   predniSONE (DELTASONE) 2.5 MG tablet TAKE 1 TABLET BY MOUTH EACH DAY 30 tablet 11   Probiotic Product (ALIGN) 4 MG CAPS 1 tablet daily for 4 weeks 30 capsule 0   PROLIA 60 MG/ML SOSY injection Inject into the skin.     senna (SENOKOT) 8.6 MG TABS tablet TAKE 2 TABLETS BY MOUTH AT BEDTIME 14 tablet 11   sertraline (ZOLOFT) 25 MG tablet Take 1 tablet (25 mg total) by mouth daily. 30 tablet 3   SYMBICORT 160-4.5 MCG/ACT inhaler Inhale into the lungs.     SYRINGE-NEEDLE, DISP, 3 ML (BD ECLIPSE SYRINGE) 25G X 1" 3 ML MISC FOR USE WITH CYANOCOBALAMIN 1 each 10   traMADol (ULTRAM) 50 MG tablet TAKE 1 TABLET BY MOUTH DAILY AS NEEDED FOR MODERATE PAIN 30 tablet 4   amoxicillin (AMOXIL) 500 MG capsule TAKE 4 CAPSULES (2000MG) BY MOUTH AT ONCE 1-HR PRIOR TO DENTAL APPT (Patient not taking: Reported on 12/17/2021) 4 capsule 11   DELSYM 30 MG/5ML liquid TAKE 1 TEASPOONFUL BY MOUTH FOUR TIMES A DAY AS NEEDED *SHAKE WELL* (Patient not taking: Reported on 12/17/2021) 89 mL 4   GUAIATUSSIN AC 100-10 MG/5ML syrup TAKE 5MLS BY MOUTH THREE TIMES A DAY AS NEEDED FOR COUGH (Patient not taking: Reported on 12/17/2021) 180 mL 4   No facility-administered medications prior to visit.    Review of Systems;  Patient denies headache, fevers, malaise, unintentional weight loss, skin rash, eye pain, sinus congestion and sinus pain, sore throat, dysphagia,  hemoptysis , cough, dyspnea, wheezing, chest pain,  palpitations, orthopnea, edema, abdominal pain, nausea, melena, diarrhea, constipation, flank pain, dysuria, hematuria, urinary  Frequency, nocturia, numbness, tingling, seizures,  Focal weakness, Loss of consciousness,  Tremor, insomnia, depression, anxiety, and suicidal ideation.      Objective:  BP 108/72 (BP Location: Left Arm, Patient Position: Sitting, Cuff Size: Small)   Pulse 69   Temp 97.9 F (36.6 C) (Oral)   Ht 4' 8"  (1.422 m)   Wt 133 lb 12.8 oz (60.7 kg)   LMP 04/11/1981   SpO2 98%   BMI 30.00 kg/m   BP  Readings from Last 3 Encounters:  12/17/21 108/72  11/20/21 (!) 88/60  10/23/21 114/82    Wt Readings from Last 3 Encounters:  12/17/21 133 lb 12.8 oz (60.7 kg)  11/20/21 142 lb (64.4 kg)  10/23/21 142 lb 12.8 oz (64.8 kg)    General appearance: alert, cooperative and appears stated age Ears: normal TM's and external ear canals both ears Throat: lips, mucosa, and tongue normal; teeth and gums normal Neck: no adenopathy, no carotid bruit, supple, symmetrical, trachea midline and thyroid not enlarged, symmetric, no tenderness/mass/nodules Back: symmetric, no curvature. ROM normal. No CVA tenderness. Lungs: clear to auscultation bilaterally Heart: regular rate and rhythm, S1, S2 normal, no murmur, click, rub or gallop Abdomen: soft, non-tender; bowel sounds normal; no masses,  no organomegaly Pulses: 2+ and symmetric Skin: Skin color, texture, turgor normal. No rashes or lesions Lymph nodes: Cervical, supraclavicular, and axillary nodes normal.  Lab Results  Component Value Date   HGBA1C 5.7 12/17/2021   HGBA1C 5.1 03/10/2021   HGBA1C 5.6 08/17/2018    Lab Results  Component Value Date   CREATININE 0.84 12/17/2021   CREATININE 1.04 06/04/2021   CREATININE 0.88 05/28/2021    Lab Results  Component Value Date   WBC 7.1 12/17/2021   HGB 12.6 12/17/2021   HCT 38.3 12/17/2021   PLT 296.0 12/17/2021   GLUCOSE 96 12/17/2021   CHOL 174 05/19/2015   TRIG  97.0 05/19/2015   HDL 47.50 05/19/2015   LDLCALC 107 (H) 05/19/2015   ALT 16 12/17/2021   AST 28 12/17/2021   NA 143 12/17/2021   K 5.2 No hemolysis seen (H) 12/17/2021   CL 103 12/17/2021   CREATININE 0.84 12/17/2021   BUN 12 12/17/2021   CO2 32 12/17/2021   TSH 2.25 12/17/2021   INR 0.95 07/28/2017   HGBA1C 5.7 12/17/2021    CT HEAD WO CONTRAST (5MM)  Result Date: 10/23/2021 CLINICAL DATA:  Headache chronic, new features or increased frequency. EXAM: CT HEAD WITHOUT CONTRAST TECHNIQUE: Contiguous axial images were obtained from the base of the skull through the vertex without intravenous contrast. RADIATION DOSE REDUCTION: This exam was performed according to the departmental dose-optimization program which includes automated exposure control, adjustment of the mA and/or kV according to patient size and/or use of iterative reconstruction technique. COMPARISON:  None Available. FINDINGS: Brain: No acute infarct, hemorrhage, or mass lesion is present. A remote infarct is present in the high medial right frontal lobe. No other focal cortical abnormalities are present. Basal ganglia is within normal limits. Insert normal brainstem Vascular: No hyperdense vessel or unexpected calcification. Skull: Calvarium is intact. No focal lytic or blastic lesions are present. No significant extracranial soft tissue lesion is present. Sinuses/Orbits: The paranasal sinuses and mastoid air cells are clear. The globes and orbits are within normal limits. IMPRESSION: 1. 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 2. No acute intracranial abnormality. Electronically Signed   By: San Morelle M.D.   On: 10/23/2021 12:42    Assessment & Plan:   Problem List Items Addressed This Visit     Macrocytic anemia with vitamin B12 deficiency   B12 deficiency    She is b12 and folate deficient and supposedly supplemented at group  Home  Needs repeat levels given persistent macrocytosis  But taking MTX)       Relevant Orders   B12 and Folate Panel   Hyperkalemia    Repeat needed . No offending meds/      Relevant Orders  Basic metabolic panel   Generalized anxiety disorder    Responding to low dose sertraline.  No changes today       Unintentional weight loss of 5% body weight or less within 1 month    Etiology unclear. Thyroid, albumin,  Liver/kidney function normal. Not anemic.  Appetite good.  Will follow       History of CVA (cerebrovascular accident) without residual deficits    Reassured that the infarct seen on the recent head CT was remote and not the cause of her current leg weakness.  81 mg aspirin and statin  Prescribed . Workup I n progress.        Other Visit Diagnoses     Weight loss, unintentional    -  Primary   Relevant Orders   TSH (Completed)   CBC with Differential/Platelet (Completed)   Comprehensive metabolic panel (Completed)   Impaired fasting glucose       Relevant Orders   Hemoglobin A1c (Completed)      Follow-up: Return in about 6 months (around 06/19/2022).   Crecencio Mc, MD

## 2021-12-18 DIAGNOSIS — R0602 Shortness of breath: Secondary | ICD-10-CM | POA: Diagnosis not present

## 2021-12-18 DIAGNOSIS — I63321 Cerebral infarction due to thrombosis of right anterior cerebral artery: Secondary | ICD-10-CM | POA: Diagnosis not present

## 2021-12-18 DIAGNOSIS — R9431 Abnormal electrocardiogram [ECG] [EKG]: Secondary | ICD-10-CM | POA: Diagnosis not present

## 2021-12-19 DIAGNOSIS — F411 Generalized anxiety disorder: Secondary | ICD-10-CM | POA: Insufficient documentation

## 2021-12-19 DIAGNOSIS — R634 Abnormal weight loss: Secondary | ICD-10-CM | POA: Insufficient documentation

## 2021-12-19 DIAGNOSIS — E875 Hyperkalemia: Secondary | ICD-10-CM | POA: Insufficient documentation

## 2021-12-19 NOTE — Assessment & Plan Note (Signed)
Repeat needed . No offending meds/

## 2021-12-19 NOTE — Assessment & Plan Note (Signed)
Responding to low dose sertraline.  No changes today

## 2021-12-19 NOTE — Assessment & Plan Note (Signed)
Reassured that the infarct seen on the recent head CT was remote and not the cause of her current leg weakness.  81 mg aspirin and statin  Prescribed . Workup I n progress.

## 2021-12-19 NOTE — Assessment & Plan Note (Signed)
Etiology unclear. Thyroid, albumin,  Liver/kidney function normal. Not anemic.  Appetite good.  Will follow

## 2021-12-19 NOTE — Assessment & Plan Note (Signed)
She is b12 and folate deficient and supposedly supplemented at group  Home  Needs repeat levels given persistent macrocytosis  But taking MTX)

## 2021-12-31 ENCOUNTER — Other Ambulatory Visit: Payer: Self-pay | Admitting: Internal Medicine

## 2022-01-11 DIAGNOSIS — R29898 Other symptoms and signs involving the musculoskeletal system: Secondary | ICD-10-CM | POA: Diagnosis not present

## 2022-01-11 DIAGNOSIS — R42 Dizziness and giddiness: Secondary | ICD-10-CM | POA: Diagnosis not present

## 2022-01-11 DIAGNOSIS — R2689 Other abnormalities of gait and mobility: Secondary | ICD-10-CM | POA: Diagnosis not present

## 2022-01-11 DIAGNOSIS — R262 Difficulty in walking, not elsewhere classified: Secondary | ICD-10-CM | POA: Diagnosis not present

## 2022-01-11 DIAGNOSIS — R251 Tremor, unspecified: Secondary | ICD-10-CM | POA: Diagnosis not present

## 2022-01-14 ENCOUNTER — Ambulatory Visit
Admission: RE | Admit: 2022-01-14 | Discharge: 2022-01-14 | Disposition: A | Discharge status: Home / Self Care | Payer: Medicare Other | Source: Ambulatory Visit | Attending: Internal Medicine | Admitting: Internal Medicine

## 2022-01-14 DIAGNOSIS — Z1231 Encounter for screening mammogram for malignant neoplasm of breast: Secondary | ICD-10-CM | POA: Insufficient documentation

## 2022-01-27 DIAGNOSIS — I63521 Cerebral infarction due to unspecified occlusion or stenosis of right anterior cerebral artery: Secondary | ICD-10-CM | POA: Diagnosis not present

## 2022-02-17 ENCOUNTER — Telehealth: Payer: Self-pay | Admitting: Internal Medicine

## 2022-02-17 MED ORDER — DIAZEPAM 5 MG PO TABS: 5.0000 mg | ORAL_TABLET | Freq: Two times a day (BID) | ORAL | 0 refills | Status: AC | PRN

## 2022-02-17 MED ORDER — DIAZEPAM 5 MG PO TABS: 5.0000 mg | ORAL_TABLET | Freq: Two times a day (BID) | ORAL | 0 refills | Status: DC | PRN

## 2022-02-17 NOTE — Telephone Encounter (Signed)
Butch Penny from [facility] called wanting to know if the provider can prescribe some type of anxiety medication because pt mom passed away last night. Butch Penny want to be called. The pharmacy is called [facility]

## 2022-02-17 NOTE — Telephone Encounter (Signed)
Rx for valium 5 mg every 12 hours prn  qty #10 sent to [facility]

## 2022-02-17 NOTE — Telephone Encounter (Signed)
Spoke with Butch Penny to let her know what medication has been sent in. Butch Penny gave a verbal understanding.

## 2022-03-02 ENCOUNTER — Ambulatory Visit (INDEPENDENT_AMBULATORY_CARE_PROVIDER_SITE_OTHER): Payer: Medicare Other

## 2022-03-02 DIAGNOSIS — Z23 Encounter for immunization: Secondary | ICD-10-CM | POA: Diagnosis not present

## 2022-03-09 ENCOUNTER — Telehealth: Payer: Self-pay | Admitting: Internal Medicine

## 2022-03-09 NOTE — Telephone Encounter (Signed)
Error

## 2022-03-10 DIAGNOSIS — M79674 Pain in right toe(s): Secondary | ICD-10-CM | POA: Diagnosis not present

## 2022-03-10 DIAGNOSIS — M79675 Pain in left toe(s): Secondary | ICD-10-CM | POA: Diagnosis not present

## 2022-03-10 DIAGNOSIS — B351 Tinea unguium: Secondary | ICD-10-CM | POA: Diagnosis not present

## 2022-03-12 ENCOUNTER — Encounter: Payer: Self-pay | Admitting: Family

## 2022-03-12 ENCOUNTER — Ambulatory Visit (INDEPENDENT_AMBULATORY_CARE_PROVIDER_SITE_OTHER): Payer: Medicare Other | Admitting: Family

## 2022-03-12 VITALS — BP 118/78 | HR 59 | Temp 97.7°F | Ht <= 58 in | Wt 143.8 lb

## 2022-03-12 DIAGNOSIS — L739 Follicular disorder, unspecified: Secondary | ICD-10-CM | POA: Insufficient documentation

## 2022-03-12 DIAGNOSIS — I639 Cerebral infarction, unspecified: Secondary | ICD-10-CM | POA: Diagnosis not present

## 2022-03-12 MED ORDER — MUPIROCIN 2 % EX OINT
1.0000 | TOPICAL_OINTMENT | Freq: Two times a day (BID) | CUTANEOUS | 1 refills | Status: AC
Start: 2022-03-12 — End: 2022-03-17

## 2022-03-12 NOTE — Assessment & Plan Note (Signed)
Presentation consistent with folliculitis.  Provided patient with Bactroban.  Advised continued use of warm compresses.  She will let me know how she is doing

## 2022-03-12 NOTE — Progress Notes (Signed)
Subjective:    Patient ID: Cindy Oconnor, female    DOB: 1970-07-29, 51 y.o.   MRN: [id number]  CC: Cindy Oconnor is a 51 y.o. female who presents today for an acute visit.    HPI: Accompanied by caregiver  Complains small bumps in the suprapubic area.  She has seen  dots of blood on her underwear this week.   No vaginal itching, changes to vaginal discharge, dysuria No concern for sex transmitted disease.  She is not sexually active  She has been using warm compresses without resolution  Lives in group home   History of abdominal aortic atherosclerosis, asthma, hypothyroidism, osteoporosis, Down syndrome, HOH.  No history of CKD HISTORY:  Past Medical History:  Diagnosis Date   Arthritis    Asthma    unspecified   Blood clot in vein    Chicken pox    Colitis    Collagen vascular disease ([place])    COPD (chronic obstructive pulmonary disease) ([place])    COVID-19 virus detected 04/30/2019   Nov 23 tested positive,  LRI symptoms    Deafness in left ear    Disease of thyroid gland    Down syndrome    DVT (deep venous thrombosis) ([facility])    Fractures    GERD (gastroesophageal reflux disease)    Hypothyroidism    unspecified   Inflammatory arthritis 01/30/2014   a. Positive anti-CCP antibodies, negative rheumatoid factor, positive FANA. b. Methotrexate, Prednisone, Plaquenil.    Joint pain    Lupus ([place]) 1995   Osteoarthritis 01/30/2014   a. Lumbar disc disease. b. Trigger nodules   Osteoporosis 01/30/2014   a. Boniva   RA (rheumatoid arthritis) (HCC)    Shingles    Ulcerative (chronic) enterocolitis (HCC)    Ulcerative colitis ([place])    Past Surgical History:  Procedure Laterality Date   ABDOMINAL HYSTERECTOMY     ABDOMINAL HYSTERECTOMY W/ PARTIAL VAGINACTOMY  1992   BUNIONECTOMY Bilateral    Bunionectomy great toe   COLONOSCOPY  04/16/2013   Hx of ulcerative colitis - repeat 2 years per Dr. Rayann Heman   COLONOSCOPY  09/24/2003   Dr. Brita Romp   FOOT  SURGERY Bilateral    x2   [place] Left 01/10/2017   Procedure: COMPUTER ASSISTED TOTAL KNEE ARTHROPLASTY;  Surgeon: Dereck Leep, MD;  Location: [facility] ORS;  Service: Orthopedics;  Laterality: Left;   KNEE ARTHROPLASTY Right 08/10/2017   Procedure: COMPUTER ASSISTED TOTAL KNEE ARTHROPLASTY;  Surgeon: Dereck Leep, MD;  Location: [facility] OR[facility];  Service: Orthopedics;  Laterality: Right;   Family History  Problem Relation Age of Onset   Heart disease Mother    Diabetes Mother    Breast cancer Mother 31   Heart disease Father    Diabetes Father    Alcohol abuse Maternal Grandmother    Arthritis Maternal Grandmother    Stroke Maternal Grandmother    Diabetes Maternal Grandmother    Alcohol abuse Maternal Grandfather    Arthritis Maternal Grandfather    Stroke Maternal Grandfather    Diabetes Maternal Grandfather     Allergies: Erythromycin, Oxycontin [oxycodone], Tramadol, and Albuterol Current Outpatient Medications on File Prior to Visit  Medication Sig Dispense Refill   acetaminophen (TYLENOL 8 HOUR) 650 MG CR tablet Take 1 tablet (650 mg total) by mouth every 8 (eight) hours as needed for pain. headache 90 tablet 2   alendronate (FOSAMAX) 70 MG tablet Take 70 mg by  mouth once a week. Take with a full glass of water on an empty stomach.     ARTHRITIS PAIN RELIEVER 1 % GEL APPLY 2 G TOPICALLY 4 (FOUR) TIMES DAILY AS NEEDED FOR JOINT PAIN. 150 g 11   atorvastatin (LIPITOR) 20 MG tablet TAKE 1 TABLET BY MOUTH ONCE DAILY 7 tablet 11   Ayr Saline Nasal No-Drip GEL INSTILL INTO NOSE TWICE DAILY TO MOISTURIZE LINING OF NOSE;INSTILL INTO NOSE TWICE A DAY AS NEEDED FOR DRY NOSE 22 mL 11   azelastine (OPTIVAR) 0.05 % ophthalmic solution PLACE 1 DROP EACH EYE TWICE DAILY *WAIT 3-5 MINUTES BETWEEN 2 EYE MEDS* *STORE UPRIGHT* 6 mL 10   BLUE GEL 2 % GEL APPLY TOPICALLY TO PAINFUL JOINTS 2 TO 3 TIMES DAILY AS NEEDED 226.8 g 11   cholecalciferol (VITAMIN D) 25  MCG (1000 UNIT) tablet TAKE 1 TABLET BY MOUTH ONCE DAILY FOR SUPPLEMENT 7 tablet 11   clotrimazole-betamethasone (LOTRISONE) cream APPLY TOPICALLY 2 TIMES PER DAY 45 g 2   cyanocobalamin (,VITAMIN B-12,) 1000 MCG/ML injection INJECT 1ML=1000MCG INTRAMUSCULARLY EVERY MONTH ON THE 30TH 1 mL 11   DELSYM 30 MG/5ML liquid TAKE 1 TEASPOONFUL BY MOUTH FOUR TIMES A DAY AS NEEDED *SHAKE WELL* 89 mL 4   dextromethorphan-guaiFENesin (MUCINEX DM) 30-600 MG 12hr tablet Take 1 tablet by mouth 2 (two) times daily as needed for cough. 20 tablet 0   diazepam (VALIUM) 5 MG tablet Take 1 tablet (5 mg total) by mouth every 12 (twelve) hours as needed for anxiety. 10 tablet 0   docusate sodium (COLACE) 100 MG capsule TAKE 1 CAPSULE BY MOUTH EVERY OTHER DAY *DO NOT CRUSH OR CHEW* 3 capsule 11   famotidine (PEPCID) 20 MG tablet One tablet daily with dinner 90 tablet 1   fluticasone (FLONASE) 50 MCG/ACT nasal spray USE 2 SPRAYS EACH NOSTRIL ONCE DAILY *SHAKE GENTLY* 16 g 11   folic acid (FOLVITE) 1 MG tablet TAKE 1 TABLET BY MOUTH EVERY DAY 30 tablet 2   GUAIATUSSIN AC 100-10 MG/5ML syrup TAKE 5MLS BY MOUTH THREE TIMES A DAY AS NEEDED FOR COUGH 180 mL 4   ipratropium (ATROVENT) 0.02 % nebulizer solution INHALE 1 VIAL VIA NEBULIZER FOUR TIMES A DAY AS NEEDED FOR WHEEZING OR COUGH 150 mL 11   ketoconazole (NIZORAL) 2 % cream APPLY TOIPICALLY ONCE DAILY 60 g 10   levalbuterol (XOPENEX) 0.63 MG/3ML nebulizer solution Take 3 mLs (0.63 mg total) by nebulization every 6 (six) hours as needed for wheezing or shortness of breath. 180 mL 11   levothyroxine (SYNTHROID) 112 MCG tablet TAKE 1 TABLET BY MOUTH ONCE DAILY 7 tablet 11   methotrexate (RHEUMATREX) 2.5 MG tablet TAKE 7 TABLETS (17.5 MG) BY MOUTH ONCE A WEEK ON FRIDAY *CAUTION: CHEMOTHERAPY. PROTECT FROM LIGHT* *NOTE DOSE AND FREQUENCY* *HAZARDOUS DRUG: WEAR GLOVES* *DO NOT CF* 28 tablet 11   minocycline (MINOCIN) 100 MG capsule Take 1 capsule (100 mg total) by mouth 2 (two)  times daily. 20 capsule 0   montelukast (SINGULAIR) 10 MG tablet TAKE 1 TABLET BY MOUTH AT BEDTIME 7 tablet 11   Multiple Vitamins-Minerals (THEREMS-M) TABS TAKE 1 TABLET BY MOUTH EVERY DAY FOR SUPPLEMENT 90 tablet 1   omeprazole (PRILOSEC) 20 MG capsule TAKE 1 CAPSULE BY MOUTH TWICE DAILY *DO NOT CRUSH OR CHEW* 14 capsule 11   OYSCO 500 + D 500-5 MG-MCG TABS TAKE 1 TABLET  BY MOUTH ONCE DAILY FOR SUPPLEMENT 30 tablet 11   predniSONE (DELTASONE) 2.5 MG tablet  TAKE 1 TABLET BY MOUTH EACH DAY 30 tablet 11   Probiotic Product (ALIGN) 4 MG CAPS 1 tablet daily for 4 weeks 30 capsule 0   PROLIA 60 MG/ML SOSY injection Inject into the skin.     senna (SENOKOT) 8.6 MG TABS tablet TAKE 2 TABLETS BY MOUTH AT BEDTIME 14 tablet 11   sertraline (ZOLOFT) 25 MG tablet Take 1 tablet (25 mg total) by mouth daily. 30 tablet 3   SYMBICORT 160-4.5 MCG/ACT inhaler Inhale into the lungs.     SYRINGE-NEEDLE, DISP, 3 ML (BD ECLIPSE SYRINGE) 25G X 1" 3 ML MISC FOR USE WITH CYANOCOBALAMIN 1 each 10   traMADol (ULTRAM) 50 MG tablet TAKE 1 TABLET BY MOUTH DAILY AS NEEDED FOR MODERATE PAIN 30 tablet 4   amoxicillin (AMOXIL) 500 MG capsule TAKE 4 CAPSULES (2000MG) BY MOUTH AT ONCE 1-HR PRIOR TO DENTAL APPT (Patient not taking: Reported on 12/17/2021) 4 capsule 11   No current facility-administered medications on file prior to visit.    Social History   Tobacco Use   Smoking status: Never   Smokeless tobacco: Never  Vaping Use   Vaping Use: Never used  Substance Use Topics   Alcohol use: No   Drug use: No    Review of Systems  Constitutional:  Negative for chills and fever.  Respiratory:  Negative for cough.   Cardiovascular:  Negative for chest pain and palpitations.  Gastrointestinal:  Negative for nausea and vomiting.  Genitourinary:  Negative for vaginal bleeding, vaginal discharge and vaginal pain.  Skin:  Positive for rash.      Objective:    BP 118/78 (BP Location: Left Arm, Patient Position:  Sitting, Cuff Size: Normal)   Pulse (!) 59   Temp 97.7 F (36.5 C) (Oral)   Ht 4' 8"  (1.422 m)   Wt 143 lb 12.8 oz (65.2 kg)   LMP 04/11/1981   SpO2 99%   BMI 32.24 kg/m    Physical Exam Vitals reviewed.  Constitutional:      Appearance: She is well-developed.  Eyes:     Conjunctiva/sclera: Conjunctivae normal.  Cardiovascular:     Rate and Rhythm: Normal rate and regular rhythm.     Pulses: Normal pulses.     Heart sounds: Normal heart sounds.  Pulmonary:     Effort: Pulmonary effort is normal.     Breath sounds: Normal breath sounds. No wheezing, rhonchi or rales.  Skin:    General: Skin is warm and dry.          Comments: 2 discrete papules, nonfluctuant seen in the suprapubic area.  No purulent draining, red streaks or increased warmth appreciated.  No vaginal discharge appreciated with external exam  Neurological:     Mental Status: She is alert.  Psychiatric:        Speech: Speech normal.        Behavior: Behavior normal.        Thought Content: Thought content normal.        Assessment & Plan:    Problem List Items Addressed This Visit       Musculoskeletal and Integument   Folliculitis - Primary    Presentation consistent with folliculitis.  Provided patient with Bactroban.  Advised continued use of warm compresses.  She will let me know how she is doing      Relevant Medications   mupirocin ointment (BACTROBAN) 2 %     I am having Ngina L. Juhasz start on mupirocin ointment. I  am also having her maintain her clotrimazole-betamethasone, folic acid, predniSONE, Therems-M, alendronate, Align, Symbicort, levalbuterol, BD Eclipse Syringe, famotidine, azelastine, cyanocobalamin, minocycline, dextromethorphan-guaiFENesin, ketoconazole, [place]yr Saline Nasal No-Drip, cholecalciferol, docusate sodium, OYSCO 500 + D, fluticasone, atorvastatin, omeprazole, senna, montelukast, traMADol, Guaiatussin AC, ipratropium, Arthritis Pain Reliever, Blue Gel, amoxicillin,  Delsym, acetaminophen, Prolia, sertraline, levothyroxine, diazepam, and methotrexate.   Meds ordered this encounter  Medications   mupirocin ointment (BACTROBAN) 2 %    Sig: Apply 1 Application topically 2 (two) times daily for 5 days.    Dispense:  22 g    Refill:  1    Order Specific Question:   Supervising Provider    Answer:   Crecencio Mc [[id number]]    Return precautions given.   Risks, benefits, and alternatives of the medications and treatment plan prescribed today were discussed, and patient expressed understanding.   Education regarding symptom management and diagnosis given to patient on AVS.  Continue to follow with Crecencio Mc, MD for routine health maintenance.   Cindy Oconnor and I agreed with plan.   Mable Paris, FNP

## 2022-03-12 NOTE — Patient Instructions (Signed)
The 2 papules that I saw in the suprapubic area appear to be folliculitis.  I have given you a topical antibiotic to start.  Please continue warm compresses at home.  Certainly let me know if rash does not completely resolved.  Folliculitis  Folliculitis is inflammation of the hair follicles. Folliculitis most commonly occurs on the scalp, thighs, legs, back, and buttocks. However, it can occur anywhere on the body. What are the causes? This condition may be caused by: A bacterial infection (common). A fungal infection. A viral infection. Contact with certain chemicals, especially oils and tars. Shaving or waxing. Greasy ointments or creams applied to the skin. Long-lasting folliculitis and folliculitis that keeps coming back may be caused by bacteria. This bacteria can live anywhere on your skin and is often found in the nostrils. What increases the risk? You are more likely to develop this condition if you have: A weakened immune system. Diabetes. Obesity. What are the signs or symptoms? Symptoms of this condition include: Redness. Soreness. Swelling. Itching. Small white or yellow, pus-filled, itchy spots (pustules) that appear over a reddened area. If there is an infection that goes deep into the follicle, these may develop into a boil (furuncle). A group of closely packed boils (carbuncle). These tend to form in hairy, sweaty areas of the body. How is this diagnosed? This condition is diagnosed with a skin exam. To find what is causing the condition, your health care provider may take a sample of one of the pustules or boils for testing in a lab. How is this treated? This condition may be treated by: Applying warm compresses to the affected areas. Taking an antibiotic medicine or applying an antibiotic medicine to the skin. Applying or bathing with an antiseptic solution. Taking an over-the-counter medicine to help with itching. Having a procedure to drain any pustules or  boils. This may be done if a pustule or boil contains a lot of pus or fluid. Having laser hair removal. This may be done to treat long-lasting folliculitis. Follow these instructions at home: Managing pain and swelling  If directed, apply heat to the affected area as often as told by your health care provider. Use the heat source that your health care provider recommends, such as a moist heat pack or a heating pad. Place a towel between your skin and the heat source. Leave the heat on for 20-30 minutes. Remove the heat if your skin turns bright red. This is especially important if you are unable to feel pain, heat, or cold. You may have a greater risk of getting burned. General instructions If you were prescribed an antibiotic medicine, take it or apply it as told by your health care provider. Do not stop using the antibiotic even if your condition improves. Check the irritated area every day for signs of infection. Check for: Redness, swelling, or pain. Fluid or blood. Warmth. Pus or a bad smell. Do not shave irritated skin. Take over-the-counter and prescription medicines only as told by your health care provider. Keep all follow-up visits as told by your health care provider. This is important. Get help right away if: You have more redness, swelling, or pain in the affected area. Red streaks are spreading from the affected area. You have a fever. Summary Folliculitis is inflammation of the hair follicles. Folliculitis most commonly occurs on the scalp, thighs, legs, back, and buttocks. This condition may be treated by taking an antibiotic medicine or applying an antibiotic medicine to the skin, and applying or  bathing with an antiseptic solution. If you were prescribed an antibiotic medicine, take it or apply it as told by your health care provider. Do not stop using the antibiotic even if your condition improves. Get help right away if you have new or worsening symptoms. Keep all  follow-up visits as told by your health care provider. This is important. This information is not intended to replace advice given to you by your health care provider. Make sure you discuss any questions you have with your health care provider. Document Revised: 07/22/2021 Document Reviewed: 03/12/2021 Elsevier Patient Education  [place].

## 2022-03-19 ENCOUNTER — Ambulatory Visit: Payer: Medicare Other | Admitting: Internal Medicine

## 2022-03-24 ENCOUNTER — Other Ambulatory Visit: Payer: Self-pay | Admitting: Internal Medicine

## 2022-03-25 ENCOUNTER — Other Ambulatory Visit: Payer: Self-pay | Admitting: Internal Medicine

## 2022-03-25 NOTE — Telephone Encounter (Signed)
Refilled: 03/27/2021 Last OV: 12/17/2021 Next OV: not scheduled

## 2022-04-27 ENCOUNTER — Other Ambulatory Visit: Payer: Self-pay | Admitting: Internal Medicine

## 2022-04-28 DIAGNOSIS — L97521 Non-pressure chronic ulcer of other part of left foot limited to breakdown of skin: Secondary | ICD-10-CM | POA: Diagnosis not present

## 2022-04-30 ENCOUNTER — Telehealth: Payer: Self-pay

## 2022-04-30 NOTE — Telephone Encounter (Signed)
Linna Hoff, Appointment Manager for [facility], stopped by to request information for patient on [place] letterhead.  [place] had Dolan Amen, ADSPEC, [employer], join [place] by phone to discuss the information needed.  Lattie Haw states [place] wants to know what patient's disability is, verify that she is still disabled, and state that nothing about this is going to change.  Lattie Haw states letter needs to include diagnosis, what she is being treated for, state that patient has had down syndrome from birth, and anything else that can help establish that this an on-going situation with her which will not be changing.  Lattie Haw states this letter needs to be on [employer] and signed by Dr. Deborra Medina.  Lattie Haw also states she would like for [place] to include statement that patient resides in [facility] at [address] [place], [place] [address], which is a [place] ([address] [place], [place] [address]) residence.  Please call Linna Hoff when letter is available for pick up.

## 2022-05-03 NOTE — Telephone Encounter (Signed)
Letter drafted and signed

## 2022-05-04 NOTE — Telephone Encounter (Signed)
Letter has been faxed to number provided below.

## 2022-05-13 DIAGNOSIS — H02051 Trichiasis without entropian right upper eyelid: Secondary | ICD-10-CM | POA: Diagnosis not present

## 2022-06-01 DIAGNOSIS — M0609 Rheumatoid arthritis without rheumatoid factor, multiple sites: Secondary | ICD-10-CM | POA: Diagnosis not present

## 2022-06-01 DIAGNOSIS — Q909 Down syndrome, unspecified: Secondary | ICD-10-CM | POA: Diagnosis not present

## 2022-06-01 DIAGNOSIS — Z79631 Long term (current) use of antimetabolite agent: Secondary | ICD-10-CM | POA: Diagnosis not present

## 2022-06-01 DIAGNOSIS — M810 Age-related osteoporosis without current pathological fracture: Secondary | ICD-10-CM | POA: Diagnosis not present

## 2022-06-23 DIAGNOSIS — M79674 Pain in right toe(s): Secondary | ICD-10-CM | POA: Diagnosis not present

## 2022-06-23 DIAGNOSIS — B351 Tinea unguium: Secondary | ICD-10-CM | POA: Diagnosis not present

## 2022-06-23 DIAGNOSIS — M79675 Pain in left toe(s): Secondary | ICD-10-CM | POA: Diagnosis not present

## 2022-06-23 DIAGNOSIS — L97521 Non-pressure chronic ulcer of other part of left foot limited to breakdown of skin: Secondary | ICD-10-CM | POA: Diagnosis not present

## 2022-07-06 ENCOUNTER — Ambulatory Visit (INDEPENDENT_AMBULATORY_CARE_PROVIDER_SITE_OTHER): Payer: Medicare Other | Admitting: Internal Medicine

## 2022-07-06 ENCOUNTER — Encounter: Payer: Self-pay | Admitting: Internal Medicine

## 2022-07-06 VITALS — BP 118/64 | HR 67 | Temp 98.2°F | Ht <= 58 in | Wt 142.0 lb

## 2022-07-06 DIAGNOSIS — D649 Anemia, unspecified: Secondary | ICD-10-CM

## 2022-07-06 DIAGNOSIS — E034 Atrophy of thyroid (acquired): Secondary | ICD-10-CM

## 2022-07-06 DIAGNOSIS — M818 Other osteoporosis without current pathological fracture: Secondary | ICD-10-CM | POA: Diagnosis not present

## 2022-07-06 DIAGNOSIS — J4541 Moderate persistent asthma with (acute) exacerbation: Secondary | ICD-10-CM

## 2022-07-06 DIAGNOSIS — D518 Other vitamin B12 deficiency anemias: Secondary | ICD-10-CM | POA: Diagnosis not present

## 2022-07-06 DIAGNOSIS — R7301 Impaired fasting glucose: Secondary | ICD-10-CM

## 2022-07-06 DIAGNOSIS — E559 Vitamin D deficiency, unspecified: Secondary | ICD-10-CM

## 2022-07-06 DIAGNOSIS — M1990 Unspecified osteoarthritis, unspecified site: Secondary | ICD-10-CM | POA: Diagnosis not present

## 2022-07-06 DIAGNOSIS — K5190 Ulcerative colitis, unspecified, without complications: Secondary | ICD-10-CM

## 2022-07-06 DIAGNOSIS — Q909 Down syndrome, unspecified: Secondary | ICD-10-CM | POA: Diagnosis not present

## 2022-07-06 DIAGNOSIS — R635 Abnormal weight gain: Secondary | ICD-10-CM | POA: Diagnosis not present

## 2022-07-06 LAB — COMPREHENSIVE METABOLIC PANEL
ALT: 13 U/L (ref 0–35)
AST: 24 U/L (ref 0–37)
Albumin: 4 g/dL (ref 3.5–5.2)
Alkaline Phosphatase: 59 U/L (ref 39–117)
BUN: 11 mg/dL (ref 6–23)
CO2: 30 mEq/L (ref 19–32)
Calcium: 8.7 mg/dL (ref 8.4–10.5)
Chloride: 103 mEq/L (ref 96–112)
Creatinine, Ser: 0.83 mg/dL (ref 0.40–1.20)
GFR: 80.23 mL/min (ref >60.00)
Glucose, Bld: 106 mg/dL — ABNORMAL HIGH (ref 70–99)
Potassium: 4.5 mEq/L (ref 3.5–5.1)
Sodium: 141 mEq/L (ref 135–145)
Total Bilirubin: 0.4 mg/dL (ref 0.2–1.2)
Total Protein: 6.4 g/dL (ref 6.0–8.3)

## 2022-07-06 LAB — B12 AND FOLATE PANEL
Folate: >23.8 ng/mL (ref >5.9)
Vitamin B-12: 958 pg/mL — ABNORMAL HIGH (ref 211–911)

## 2022-07-06 LAB — HEMOGLOBIN A1C: Hgb A1c MFr Bld: 5.4 % (ref 4.6–6.5)

## 2022-07-06 LAB — TSH: TSH: 2.78 u[IU]/mL (ref 0.35–5.50)

## 2022-07-06 LAB — VITAMIN D 25 HYDROXY (VIT D DEFICIENCY, FRACTURES): VITD: 37.52 ng/mL (ref 30.00–100.00)

## 2022-07-06 NOTE — Assessment & Plan Note (Signed)
PFT results considered to be masked by restrictive physiology.   She has gained weight,  which was addressed today . Continue symbicort.  , singular, and  fexofenadine.  Proper use of inhaler demonstrated

## 2022-07-06 NOTE — Assessment & Plan Note (Signed)
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

## 2022-07-06 NOTE — Assessment & Plan Note (Addendum)
Symptoms have bee quiescent.  Her Last colonoscopy was in 2014 and 2 yr follow up was advised.  She was referred to to [facility] gastroenterology In 2019 but was not seen due ot her foster mother's health issues

## 2022-07-06 NOTE — Assessment & Plan Note (Signed)
Her diagnosis has changed several times , upon review of various rheumatologists and orthopedist notes from 2018 forward.  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 She is taking MTX and folic acid , prescribed by Rheum and surveillance labs have been done

## 2022-07-06 NOTE — Patient Instructions (Addendum)
You have gained 10 lbs since your last visit   Too many desserts are making you gain  weight  Not more than 2 cookies OR 1 piece of cake daily   You can have  apple sauce,  or orange  or grapes or  one banana daily   No more soda or sweet tea . You must drink water

## 2022-07-06 NOTE — Assessment & Plan Note (Signed)
Managed with Theragran MVI and cyanocobalamin . Repeating levels today

## 2022-07-06 NOTE — Assessment & Plan Note (Signed)
Managed with Prolia . Following 10 yrs of bisphosphonate therapy.  History of fractures in 2006 . Checking vitamin d level today

## 2022-07-06 NOTE — Progress Notes (Signed)
Subjective:  Patient ID: Cindy Oconnor, female    DOB: 09/11/1970  Age: 52 y.o. MRN: [id number]  CC: The primary encounter diagnosis was Vitamin D deficiency. Diagnoses of Anemia, unspecified type, Weight gain, Impaired fasting glucose, Hypothyroidism due to acquired atrophy of thyroid, Chronic inflammatory arthritis, Chronic ulcerative colitis without complication, unspecified location ([place]), Macrocytic anemia with vitamin B12 deficiency, Other osteoporosis without current pathological fracture, Moderate persistent chronic asthma with acute exacerbation, and Down's syndrome were also pertinent to this visit.   HPI Cindy Oconnor presents for follow up on chronic issues  Chief Complaint  Patient presents with   Medical Management of Chronic Issues    FL2 forms   Angie and [place] with her today from the group home.   Asthma meds not getting fully inhaled.  Has a spacer .  Demonstrated proper techinique   Seeing Rheumatology for inflammatory arthritis : receiving MTX.  Recently she requested steroid injections in her hand but was denied    Outpatient Medications Prior to Visit  Medication Sig Dispense Refill   acetaminophen (TYLENOL 8 HOUR) 650 MG CR tablet Take 1 tablet (650 mg total) by mouth every 8 (eight) hours as needed for pain. headache 90 tablet 2   ARTHRITIS PAIN RELIEVER 1 % GEL APPLY 2 G TOPICALLY 4 (FOUR) TIMES DAILY AS NEEDED FOR JOINT PAIN. 150 g 11   aspirin EC 81 MG tablet Take 81 mg by mouth daily. Swallow whole.     atorvastatin (LIPITOR) 20 MG tablet TAKE 1 TABLET BY MOUTH ONCE DAILY 7 tablet 11   [place] Saline Nasal No-Drip GEL INSTILL INTO NOSE TWICE DAILY TO MOISTURIZE LINING OF NOSE;INSTILL INTO NOSE TWICE A DAY AS NEEDED FOR DRY NOSE 22 mL 11   azelastine (OPTIVAR) 0.05 % ophthalmic solution PLACE 1 DROP EACH EYE TWICE DAILY  *WAIT 3-5 MINUTES BETWEEN 2 EYE MEDS* *STORE UPRIGHT* 6 mL 11   BD ECLIPSE SYRINGE 25G X 1" 3 ML MISC FOR USE WITH CYANOCOBALAMIN 1  each 11   BLUE GEL 2 % GEL APPLY TOPICALLY TO PAINFUL JOINTS 2 TO 3 TIMES DAILY AS NEEDED 226.8 g 11   chlorhexidine (PERIDEX) 0.12 % solution Use as directed 15 mLs in the mouth or throat 2 (two) times daily.     cholecalciferol (VITAMIN D) 25 MCG (1000 UNIT) tablet TAKE 1 TABLET BY MOUTH ONCE DAILY FOR SUPPLEMENT 7 tablet 11   clotrimazole-betamethasone (LOTRISONE) cream APPLY TOPICALLY 2 TIMES PER DAY 45 g 2   cyanocobalamin (VITAMIN B12) 1000 MCG/ML injection INJECT 1ML=1000MCG INTRAMUSCULARLY EVERY MONTH ON THE 30TH 1 mL 11   DELSYM 30 MG/5ML liquid TAKE 1 TEASPOONFUL BY MOUTH FOUR TIMES A DAY AS NEEDED *SHAKE WELL* 89 mL 4   diazepam (VALIUM) 5 MG tablet Take 1 tablet (5 mg total) by mouth every 12 (twelve) hours as needed for anxiety. 10 tablet 0   docusate sodium (COLACE) 100 MG capsule TAKE 1 CAPSULE BY MOUTH EVERY OTHER DAY *DO NOT CRUSH OR CHEW* 3 capsule 11   fluticasone (FLONASE) 50 MCG/ACT nasal spray USE 2 SPRAYS EACH NOSTRIL ONCE DAILY *SHAKE GENTLY* 16 g 11   folic acid (FOLVITE) 1 MG tablet TAKE 1 TABLET BY MOUTH EVERY DAY 30 tablet 2   gabapentin (NEURONTIN) 300 MG capsule Take 300 mg by mouth 2 (two) times daily.     GUAIATUSSIN AC 100-10 MG/5ML syrup TAKE 5MLS BY MOUTH THREE TIMES A DAY AS NEEDED FOR COUGH 180 mL 4   ipratropium (  ATROVENT) 0.02 % nebulizer solution INHALE 1 VIAL VIA NEBULIZER FOUR TIMES A DAY AS NEEDED FOR WHEEZING OR COUGH 150 mL 11   ketoconazole (NIZORAL) 2 % cream APPLY TOIPICALLY ONCE DAILY 60 g 10   levalbuterol (XOPENEX) 0.63 MG/3ML nebulizer solution Take 3 mLs (0.63 mg total) by nebulization every 6 (six) hours as needed for wheezing or shortness of breath. 180 mL 11   levothyroxine (SYNTHROID) 112 MCG tablet TAKE 1 TABLET BY MOUTH ONCE DAILY 7 tablet 11   methotrexate (RHEUMATREX) 2.5 MG tablet TAKE 7 TABLETS (17.5 MG) BY MOUTH ONCE A WEEK ON FRIDAY *CAUTION: CHEMOTHERAPY. PROTECT FROM LIGHT* *NOTE DOSE AND FREQUENCY* *HAZARDOUS DRUG: WEAR GLOVES* *DO  NOT CF* 28 tablet 11   montelukast (SINGULAIR) 10 MG tablet TAKE 1 TABLET BY MOUTH AT BEDTIME 7 tablet 11   Multiple Vitamins-Minerals (THEREMS-M) TABS TAKE 1 TABLET BY MOUTH EVERY DAY FOR SUPPLEMENT 90 tablet 1   omeprazole (PRILOSEC) 20 MG capsule TAKE 1 CAPSULE BY MOUTH TWICE DAILY *DO NOT CRUSH OR CHEW* 14 capsule 11   OYSCO 500 + D 500-5 MG-MCG TABS TAKE 1 TABLET  BY MOUTH ONCE DAILY FOR SUPPLEMENT 30 tablet 11   predniSONE (DELTASONE) 2.5 MG tablet TAKE 1 TABLET BY MOUTH EACH DAY 30 tablet 11   PROLIA 60 MG/ML SOSY injection Inject into the skin.     senna (SENOKOT) 8.6 MG TABS tablet TAKE 2 TABLETS BY MOUTH AT BEDTIME 14 tablet 11   sertraline (ZOLOFT) 25 MG tablet TAKE 1 TABLET BY MOUTH ONCE DAILY 30 tablet 2   sodium fluoride (PREVIDENT 5000 PLUS) 1.1 % CREA dental cream Place 1 Application onto teeth every evening.     SYMBICORT 160-4.5 MCG/ACT inhaler Inhale into the lungs.     traMADol (ULTRAM) 50 MG tablet TAKE 1 TABLET BY MOUTH DAILY AS NEEDED FOR MODERATE PAIN 30 tablet 4   amoxicillin (AMOXIL) 500 MG capsule TAKE 4 CAPSULES ('2000MG'$ ) BY MOUTH AT ONCE 1-HR PRIOR TO DENTAL APPT (Patient not taking: Reported on 12/17/2021) 4 capsule 11   dextromethorphan-guaiFENesin (MUCINEX DM) 30-600 MG 12hr tablet Take 1 tablet by mouth 2 (two) times daily as needed for cough. (Patient not taking: Reported on 07/06/2022) 20 tablet 0   famotidine (PEPCID) 20 MG tablet One tablet daily with dinner (Patient not taking: Reported on 07/06/2022) 90 tablet 1   minocycline (MINOCIN) 100 MG capsule Take 1 capsule (100 mg total) by mouth 2 (two) times daily. (Patient not taking: Reported on 07/06/2022) 20 capsule 0   Probiotic Product (ALIGN) 4 MG CAPS 1 tablet daily for 4 weeks (Patient not taking: Reported on 07/06/2022) 30 capsule 0   alendronate (FOSAMAX) 70 MG tablet Take 70 mg by mouth once a week. Take with a full glass of water on an empty stomach. (Patient not taking: Reported on 07/06/2022)     No  facility-administered medications prior to visit.    Review of Systems;  Patient denies headache, fevers, malaise, unintentional weight loss, skin rash, eye pain, sinus congestion and sinus pain, sore throat, dysphagia,  hemoptysis , cough, dyspnea, wheezing, chest pain, palpitations, orthopnea, edema, abdominal pain, nausea, melena, diarrhea, constipation, flank pain, dysuria, hematuria, urinary  Frequency, nocturia, numbness, tingling, seizures,  Focal weakness, Loss of consciousness,  Tremor, insomnia, depression, anxiety, and suicidal ideation.      Objective:  BP 118/64   Pulse 67   Temp 98.2 F (36.8 C) (Oral)   Ht '4\' 8"'$  (1.422 m)   Wt 142 lb (64.4  kg)   LMP 04/11/1981   SpO2 94%   BMI 31.84 kg/m   BP Readings from Last 3 Encounters:  07/06/22 118/64  03/12/22 118/78  12/17/21 108/72    Wt Readings from Last 3 Encounters:  07/06/22 142 lb (64.4 kg)  03/12/22 143 lb 12.8 oz (65.2 kg)  12/17/21 133 lb 12.8 oz (60.7 kg)    Physical Exam Vitals reviewed.  Constitutional:      General: She is not in acute distress.    Appearance: Normal appearance. She is normal weight. She is not ill-appearing, toxic-appearing or diaphoretic.  HENT:     Head: Normocephalic.  Eyes:     General: No scleral icterus.       Right eye: No discharge.        Left eye: No discharge.     Conjunctiva/sclera: Conjunctivae normal.  Cardiovascular:     Rate and Rhythm: Normal rate and regular rhythm.     Heart sounds: Normal heart sounds.  Pulmonary:     Effort: Pulmonary effort is normal. No respiratory distress.     Breath sounds: Normal breath sounds.  Musculoskeletal:        General: Deformity present. No swelling. Normal range of motion.     Right hand: Deformity present.     Left hand: Deformity present.  Skin:    General: Skin is warm and dry.  Neurological:     General: No focal deficit present.     Mental Status: She is alert and oriented to person, place, and time. Mental  status is at baseline.  Psychiatric:        Mood and Affect: Mood normal.        Behavior: Behavior normal.        Thought Content: Thought content normal.        Judgment: Judgment normal.     Lab Results  Component Value Date   HGBA1C 5.4 07/06/2022   HGBA1C 5.7 12/17/2021   HGBA1C 5.1 03/10/2021    Lab Results  Component Value Date   CREATININE 0.83 07/06/2022   CREATININE 0.84 12/17/2021   CREATININE 1.04 06/04/2021    Lab Results  Component Value Date   WBC 7.1 12/17/2021   HGB 12.6 12/17/2021   HCT 38.3 12/17/2021   PLT 296.0 12/17/2021   GLUCOSE 106 (H) 07/06/2022   CHOL 174 05/19/2015   TRIG 97.0 05/19/2015   HDL 47.50 05/19/2015   LDLCALC 107 (H) 05/19/2015   ALT 13 07/06/2022   AST 24 07/06/2022   NA 141 07/06/2022   K 4.5 07/06/2022   CL 103 07/06/2022   CREATININE 0.83 07/06/2022   BUN 11 07/06/2022   CO2 30 07/06/2022   TSH 2.78 07/06/2022   INR 0.95 07/28/2017   HGBA1C 5.4 07/06/2022    MM 3D SCREEN BREAST BILATERAL  Result Date: 01/15/2022 CLINICAL DATA:  Screening. EXAM: DIGITAL SCREENING BILATERAL MAMMOGRAM WITH TOMOSYNTHESIS AND CAD TECHNIQUE: Bilateral screening digital craniocaudal and mediolateral oblique mammograms were obtained. Bilateral screening digital breast tomosynthesis was performed. The images were evaluated with computer-aided detection. COMPARISON:  Previous exam(s). ACR Breast Density Category a: The breast tissue is almost entirely fatty. FINDINGS: There are no findings suspicious for malignancy. IMPRESSION: No mammographic evidence of malignancy. A result letter of this screening mammogram will be mailed directly to the patient. RECOMMENDATION: Screening mammogram in one year. (Code:SM-B-01Y) BI-RADS CATEGORY  1: Negative. Electronically Signed   By: Kristopher Oppenheim M.D.   On: 01/15/2022 08:56    Assessment &  Plan:  .Vitamin D deficiency -     VITAMIN D 25 Hydroxy (Vit-D Deficiency, Fractures)  Anemia, unspecified type -      B12 and Folate Panel -     Comprehensive metabolic panel -     Iron, TIBC and Ferritin Panel  Weight gain  Impaired fasting glucose -     Hemoglobin A1c  Hypothyroidism due to acquired atrophy of thyroid -     TSH  Chronic inflammatory arthritis Assessment & Plan: Her diagnosis has changed several times , upon review of various rheumatologists and orthopedist notes from 2018 forward.  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 She is taking MTX and folic acid , prescribed by Rheum and surveillance labs have been done    Chronic ulcerative colitis without complication, unspecified location [facility]) Assessment & Plan: Symptoms have bee quiescent.  Her Last colonoscopy was in 2014 and 2 yr follow up was advised.  She was referred to to [facility] gastroenterology In 2019 but was not seen due ot her foster mother's health issues    Macrocytic anemia with vitamin B12 deficiency Assessment & Plan: Managed with Theragran MVI and cyanocobalamin . Repeating levels today    Other osteoporosis without current pathological fracture Assessment & Plan: Managed with Prolia . Following 10 yrs of bisphosphonate therapy.  History of fractures in 2006 . Checking vitamin d level today    Moderate persistent chronic asthma with acute exacerbation Assessment & Plan: PFT results considered to be masked by restrictive physiology.   She has gained weight,  which was addressed today . Continue symbicort.  , singular, and  fexofenadine.  Proper use of inhaler demonstrated   Down's syndrome Assessment & Plan: She is incapabel of managing her own medications due to intellectual disability and requires supervision in a group home       I provided 30 minutes of face-to-face time during this encounter reviewing patient's last visit with me, patient's  most recent visit with rheumatology, orthopedics ,  recent surgical and non surgical procedures, previous  labs and imaging studies,  counseling on currently addressed issues,  and post visit ordering to diagnostics and therapeutics .   Follow-up: Return in about 6 months (around 01/04/2023).   Crecencio Mc, MD

## 2022-07-07 LAB — IRON,TIBC AND FERRITIN PANEL
%SAT: 22 % (calc) (ref 16–45)
Ferritin: 64 ng/mL (ref 16–232)
Iron: 47 ug/dL (ref 45–160)
TIBC: 215 mcg/dL (calc) — ABNORMAL LOW (ref 250–450)

## 2022-07-27 ENCOUNTER — Other Ambulatory Visit: Payer: Self-pay | Admitting: Internal Medicine

## 2022-07-27 DIAGNOSIS — R519 Headache, unspecified: Secondary | ICD-10-CM

## 2022-08-13 ENCOUNTER — Other Ambulatory Visit: Payer: Self-pay | Admitting: Internal Medicine

## 2022-08-17 ENCOUNTER — Ambulatory Visit (INDEPENDENT_AMBULATORY_CARE_PROVIDER_SITE_OTHER): Payer: Medicare Other | Admitting: Internal Medicine

## 2022-08-17 ENCOUNTER — Encounter: Payer: Self-pay | Admitting: Internal Medicine

## 2022-08-17 VITALS — BP 90/56 | HR 63 | Temp 97.8°F | Ht <= 58 in | Wt 140.6 lb

## 2022-08-17 DIAGNOSIS — M818 Other osteoporosis without current pathological fracture: Secondary | ICD-10-CM | POA: Diagnosis not present

## 2022-08-17 DIAGNOSIS — I700 Atherosclerosis of aorta: Secondary | ICD-10-CM | POA: Diagnosis not present

## 2022-08-17 DIAGNOSIS — J4541 Moderate persistent asthma with (acute) exacerbation: Secondary | ICD-10-CM | POA: Diagnosis not present

## 2022-08-17 DIAGNOSIS — E669 Obesity, unspecified: Secondary | ICD-10-CM

## 2022-08-17 DIAGNOSIS — Z0000 Encounter for general adult medical examination without abnormal findings: Secondary | ICD-10-CM

## 2022-08-17 DIAGNOSIS — Z96652 Presence of left artificial knee joint: Secondary | ICD-10-CM | POA: Diagnosis not present

## 2022-08-17 MED ORDER — ATORVASTATIN CALCIUM 20 MG PO TABS: 20.0000 mg | ORAL_TABLET | Freq: Every day | ORAL | 11 refills | Status: DC

## 2022-08-17 MED ORDER — MONTELUKAST SODIUM 10 MG PO TABS: 10.0000 mg | ORAL_TABLET | Freq: Every day | ORAL | 11 refills | Status: DC

## 2022-08-17 MED ORDER — LEVALBUTEROL HCL 0.63 MG/3ML IN NEBU: 0.6300 mg | INHALATION_SOLUTION | Freq: Three times a day (TID) | RESPIRATORY_TRACT | 11 refills | Status: AC | PRN

## 2022-08-17 MED ORDER — OMEPRAZOLE 20 MG PO CPDR: DELAYED_RELEASE_CAPSULE | ORAL | 11 refills | Status: DC

## 2022-08-17 MED ORDER — FLUTICASONE-SALMETEROL 230-21 MCG/ACT IN AERO: 2.0000 | INHALATION_SPRAY | Freq: Two times a day (BID) | RESPIRATORY_TRACT | 12 refills | Status: DC

## 2022-08-17 MED ORDER — IPRATROPIUM BROMIDE 0.02 % IN SOLN: RESPIRATORY_TRACT | 11 refills | Status: DC

## 2022-08-17 NOTE — Progress Notes (Deleted)
Subjective:  Patient ID: Cindy Oconnor, female    DOB: Oct 21, 1970  Age: 52 y.o. MRN: [id number]  CC: There were no encounter diagnoses.   HPI Cindy Oconnor presents for  Chief Complaint  Patient presents with   Medical Management of Chronic Issues    Medication refill   Asthma needs alternative to symbicort.  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 150 g 11   aspirin EC 81 MG tablet Take 81 mg by mouth daily. Swallow whole.     Ayr Saline Nasal No-Drip GEL INSTILL INTO NOSE TWICE DAILY TO MOISTURIZE LINING OF NOSE;INSTILL INTO NOSE TWICE A DAY AS NEEDED FOR DRY NOSE 22 mL 11   azelastine (OPTIVAR) 0.05 % ophthalmic solution PLACE 1 DROP EACH EYE TWICE DAILY  *WAIT 3-5 MINUTES BETWEEN 2 EYE MEDS* *STORE UPRIGHT* 6 mL 11   BD ECLIPSE SYRINGE 25G X 1" 3 ML MISC FOR USE WITH CYANOCOBALAMIN 1 each 11   BLUE GEL 2 % GEL APPLY TOPICALLY TO PAINFUL JOINTS 2 TO 3 TIMES DAILY AS NEEDED 226.8 g 11   chlorhexidine (PERIDEX) 0.12 % solution Use as directed 15 mLs in the mouth or throat 2 (two) times daily.     cholecalciferol (VITAMIN D) 25 MCG (1000 UNIT) tablet TAKE 1 TABLET BY MOUTH ONCE DAILY FOR SUPPLEMENT 7 tablet 11   clotrimazole-betamethasone (LOTRISONE) cream APPLY TOPICALLY 2 TIMES PER DAY 45 g 2   cyanocobalamin (VITAMIN B12) 1000 MCG/ML injection INJECT 1ML=1000MCG INTRAMUSCULARLY EVERY MONTH ON THE 30TH 1 mL 11   DELSYM 30 MG/5ML liquid TAKE 1 TEASPOONFUL BY MOUTH FOUR TIMES A  DAY AS NEEDED *SHAKE WELL* 89 mL 4   diazepam (VALIUM) 5 MG tablet Take 1 tablet (5 mg total) by mouth every 12 (twelve) hours as needed for anxiety. 10 tablet 0   docusate sodium (COLACE) 100 MG capsule TAKE 1 CAPSULE BY MOUTH EVERY OTHER DAY *DO NOT CRUSH OR CHEW* 3 capsule 11   fluticasone (FLONASE) 50 MCG/ACT nasal spray USE 2 SPRAYS EACH NOSTRIL ONCE DAILY *SHAKE GENTLY* 16 g 11   folic acid (FOLVITE) 1 MG tablet TAKE 1 TABLET BY MOUTH EVERY DAY 30 tablet 2   gabapentin (NEURONTIN) 300 MG capsule Take 300 mg by mouth 2 (two) times daily.     GUAIATUSSIN AC 100-10 MG/5ML syrup TAKE 5MLS BY MOUTH THREE TIMES A DAY AS NEEDED FOR COUGH 180 mL 4   ketoconazole (NIZORAL) 2 % cream APPLY TOIPICALLY ONCE DAILY 60 g 10   levothyroxine (SYNTHROID) 112 MCG tablet TAKE 1 TABLET BY MOUTH ONCE DAILY 7 tablet 11   methotrexate (RHEUMATREX) 2.5 MG tablet TAKE 7 TABLETS (17.5 MG) BY MOUTH ONCE A WEEK ON FRIDAY *CAUTION: CHEMOTHERAPY. PROTECT FROM LIGHT* *NOTE DOSE AND FREQUENCY* *HAZARDOUS DRUG: WEAR GLOVES* *DO NOT CF* 28 tablet 11   Multiple Vitamins-Minerals (THEREMS-M) TABS TAKE 1 TABLET BY MOUTH EVERY DAY FOR SUPPLEMENT 90 tablet  1   OYSCO 500 + D 500-5 MG-MCG TABS TAKE 1 TABLET  BY MOUTH ONCE DAILY FOR SUPPLEMENT 30 tablet 11   predniSONE (DELTASONE) 2.5 MG tablet TAKE 1 TABLET BY MOUTH EACH DAY 30 tablet 11   PROLIA 60 MG/ML SOSY injection Inject into the skin.     senna (SENOKOT) 8.6 MG TABS tablet TAKE 2 TABLETS BY MOUTH AT BEDTIME 14 tablet 11   sertraline (ZOLOFT) 25 MG tablet TAKE 1 TABLET BY MOUTH ONCE DAILY 7 tablet 10   sodium fluoride (PREVIDENT 5000 PLUS) 1.1 % CREA dental cream Place 1 Application onto teeth every evening.     SYMBICORT 160-4.5 MCG/ACT inhaler INHALE 2 PUFFS INTO THE LUNGS TWICE DAILY *USE WITH SPACER* *SHAKE WELL* *RINSE MOUTH AFTER USE* *PRIME PRIOR TO FIRST USE, IF NOT USED >7 DAYS, OR IF DROPPED. PRIME BY SHAKING FOR 5 SECONDS THEN SPRAY A TEST SPRAY, THEN SHAKE AGAIN FOR  5 SECONDS & RELEASE A SECOND TEST SPRAY* *WAIT 1 MINUTE BETWEEN EACH PUFF* **EXPIRES 3 MONTHS AFTER OPENING* 10.2 g 10   atorvastatin (LIPITOR) 20 MG tablet TAKE 1 TABLET BY MOUTH ONCE DAILY 7 tablet 11   ipratropium (ATROVENT) 0.02 % nebulizer solution INHALE 1 VIAL VIA NEBULIZER FOUR TIMES A DAY AS NEEDED FOR WHEEZING OR COUGH 150 mL 11   levalbuterol (XOPENEX) 0.63 MG/3ML nebulizer solution Take 3 mLs (0.63 mg total) by nebulization every 6 (six) hours as needed for wheezing or shortness of breath. 180 mL 11   montelukast (SINGULAIR) 10 MG tablet TAKE 1 TABLET BY MOUTH AT BEDTIME 7 tablet 11   omeprazole (PRILOSEC) 20 MG capsule TAKE 1 CAPSULE BY MOUTH TWICE DAILY *DO NOT CRUSH OR CHEW* 14 capsule 11   dextromethorphan-guaiFENesin (MUCINEX DM) 30-600 MG 12hr tablet Take 1 tablet by mouth 2 (two) times daily as needed for cough. (Patient not taking: Reported on 08/17/2022) 20 tablet 0   traMADol (ULTRAM) 50 MG tablet TAKE 1 TABLET BY MOUTH DAILY AS NEEDED FOR MODERATE PAIN (Patient not taking: Reported on 08/17/2022) 30 tablet 4   famotidine (PEPCID) 20 MG tablet One tablet daily with dinner (Patient not taking: Reported on 08/17/2022) 90 tablet 1   minocycline (MINOCIN) 100 MG capsule Take 1 capsule (100 mg total) by mouth 2 (two) times daily. (Patient not taking: Reported on 08/17/2022) 20 capsule 0   Probiotic Product (ALIGN) 4 MG CAPS 1 tablet daily for 4 weeks (Patient not taking: Reported on 08/17/2022) 30 capsule 0   No facility-administered medications prior to visit.    Review of Systems;  Patient denies headache, fevers, malaise, unintentional weight loss, skin rash, eye pain, sinus congestion and sinus pain, sore throat, dysphagia,  hemoptysis , cough, dyspnea, wheezing, chest pain, palpitations, orthopnea, edema, abdominal pain, nausea, melena, diarrhea, constipation, flank pain, dysuria, hematuria, urinary  Frequency, nocturia, numbness, tingling, seizures,  Focal weakness, Loss of  consciousness,  Tremor, insomnia, depression, anxiety, and suicidal ideation.      Objective:  BP (!) 90/56   Pulse 63   Temp 97.8 F (36.6 C) (Oral)   Ht 4\' 8"  (1.422 m)   Wt 140 lb 9.6 oz (63.8 kg)   LMP 04/11/1981   SpO2 94%   BMI 31.52 kg/m   BP Readings from Last 3 Encounters:  08/17/22 (!) 90/56  07/06/22 118/64  03/12/22 118/78    Wt Readings from Last 3 Encounters:  08/17/22 140 lb 9.6 oz (63.8 kg)  07/06/22 142 lb (64.4 kg)  03/12/22 143 lb  12.8 oz (65.2 kg)    Physical Exam  Lab Results  Component Value Date   HGBA1C 5.4 07/06/2022   HGBA1C 5.7 12/17/2021   HGBA1C 5.1 03/10/2021    Lab Results  Component Value Date   CREATININE 0.83 07/06/2022   CREATININE 0.84 12/17/2021   CREATININE 1.04 06/04/2021    Lab Results  Component Value Date   WBC 7.1 12/17/2021   HGB 12.6 12/17/2021   HCT 38.3 12/17/2021   PLT 296.0 12/17/2021   GLUCOSE 106 (H) 07/06/2022   CHOL 174 05/19/2015   TRIG 97.0 05/19/2015   HDL 47.50 05/19/2015   LDLCALC 107 (H) 05/19/2015   ALT 13 07/06/2022   AST 24 07/06/2022   NA 141 07/06/2022   K 4.5 07/06/2022   CL 103 07/06/2022   CREATININE 0.83 07/06/2022   BUN 11 07/06/2022   CO2 30 07/06/2022   TSH 2.78 07/06/2022   INR 0.95 07/28/2017   HGBA1C 5.4 07/06/2022    MM 3D SCREEN BREAST BILATERAL  Result Date: 01/15/2022 CLINICAL DATA:  Screening. EXAM: DIGITAL SCREENING BILATERAL MAMMOGRAM WITH TOMOSYNTHESIS AND CAD TECHNIQUE: Bilateral screening digital craniocaudal and mediolateral oblique mammograms were obtained. Bilateral screening digital breast tomosynthesis was performed. The images were evaluated with computer-aided detection. COMPARISON:  Previous exam(s). ACR Breast Density Category a: The breast tissue is almost entirely fatty. FINDINGS: There are no findings suspicious for malignancy. IMPRESSION: No mammographic evidence of malignancy. A result letter of this screening mammogram will be mailed directly to the  patient. RECOMMENDATION: Screening mammogram in one year. (Code:[id number]-B-01Y) BI-RADS CATEGORY  1: Negative. Electronically Signed   By: Kristopher Oppenheim M.D.   On: 01/15/2022 08:56    Assessment & Plan:  .There are no diagnoses linked to this encounter.   I provided 30 minutes of face-to-face time during this encounter reviewing patient's last visit with me, patient's  most recent visit with cardiology,  nephrology,  and neurology,  recent surgical and non surgical procedures, previous  labs and imaging studies, counseling on currently addressed issues,  and post visit ordering to diagnostics and therapeutics .   Follow-up: No follow-ups on file.   Crecencio Mc, MD

## 2022-08-17 NOTE — Progress Notes (Unsigned)
Patient ID: Cindy Oconnor, female    DOB: 12-Sep-1970  Age: 52 y.o. MRN: [id number]  The patient is here for  follow up and  management of other chronic and acute problems.   The risk factors are reflected in the social history.  The roster of all physicians providing medical care to patient - is listed in the Snapshot section of the chart.  Activities of daily living:  The patient  has Down's syndrome and is  fully dependent on assistance for most  ADLs.  She lives in a group home and uses a walker  due to concurrent rheumatoid arthritis resulting in bilateral knee pain and history of knee replacement.  She is  accompanied by [place] today, an employee of  the [employer] facility.   Home safety : The patient has smoke detectors in the home. They wear seatbelts.  There are no firearms at home. There is no violence in the home.   There is no risks for hepatitis, STDs or HIV. There is no   history of blood transfusion. They have no travel history to infectious disease endemic areas of the world.  The patient has seen their dentist in the last six month. They have seen their eye doctor in the last year. They admit to slight hearing difficulty with regard to whispered voices and some television programs.  They have deferred audiologic testing in the last year.  They do not  have excessive sun exposure. Discussed the need for sun protection: hats, long sleeves and use of sunscreen if there is significant sun exposure.   Diet: the importance of a healthy diet is discussed. She has a relatively  healthy diet.  The benefits of regular aerobic exercise were discussed. She walks 4 times per week ,  20 minutes.   Depression screen: there are no signs or vegative symptoms of depression- irritability, change in appetite, anhedonia, sadness/tearfullness.  Cognitive assessment: the patient cannot manage any of her  financial or  personal affairs and is actively engaged. T   The following portions of  the patient's history were reviewed and updated as appropriate: allergies, current medications, past family history, past medical history,  past surgical history, past social history  and problem list.  Visual acuity was not assessed per patient preference since she has regular follow up with her ophthalmologist. Hearing and body mass index were assessed and reviewed.   During the course of the visit the patient was educated and counseled about appropriate screening and preventive services including : fall prevention , diabetes screening, nutrition counseling, colorectal cancer screening, and recommended immunizations.    CC: The primary encounter diagnosis was Routine general medical examination at a health care facility. Diagnoses of Morbid obesity ([place]), Moderate persistent chronic asthma with acute exacerbation, Abdominal aortic atherosclerosis ([place]), Status post total left knee replacement, and Other osteoporosis without current pathological fracture were also pertinent to this visit.   Asthma:  her insurance is mandating a change in maintenance inhaler from Symbicort.  She has had No recent flares  Overweight:  she is losing weight intentionally  and has lost  2 lbs since last visit by limiting her desserts and being more active.  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   She has a past surgical history that includes Foot surgery (Bilateral); Knee Arthroplasty (Left, 01/10/2017); Hallux valgus correction; Abdominal hysterectomy w/ partial vaginactomy (1992); Colonoscopy (04/16/2013); Colonoscopy (09/24/2003); Bunionectomy (Bilateral); Abdominal hysterectomy; and Knee Arthroplasty (Right, 08/10/2017).   Her family history includes Alcohol abuse in her maternal grandfather and maternal grandmother; Arthritis in her maternal grandfather and maternal grandmother; Breast cancer (age of onset: 63) in her mother; Diabetes in her father, maternal grandfather, maternal grandmother, and mother; Heart disease in her father and mother; Stroke in her maternal grandfather and maternal grandmother.She reports that she has never smoked. She has never used smokeless tobacco. She reports that she does not drink alcohol and does not use drugs.  Outpatient Medications Prior to Visit  Medication Sig Dispense Refill   acetaminophen (TYLENOL) 650 MG CR tablet TAKE 1 TABLET BY MOUTH EVERY EIGHT HOURS 21 tablet 10   amoxicillin (AMOXIL) 500 MG capsule TAKE 4 CAPSULES (2000MG ) BY MOUTH AT ONCE 1-HR PRIOR TO DENTAL APPT 4 capsule 11   ARTHRITIS PAIN RELIEVER 1 % GEL APPLY 2 G TOPICALLY 4 (FOUR) TIMES DAILY AS NEEDED FOR JOINT PAIN. 150 g 11   aspirin EC 81 MG tablet Take 81 mg by mouth daily. Swallow whole.     Ayr Saline Nasal No-Drip GEL INSTILL INTO NOSE TWICE DAILY TO MOISTURIZE LINING OF NOSE;INSTILL INTO NOSE TWICE A DAY AS NEEDED FOR DRY NOSE 22 mL 11   azelastine (OPTIVAR) 0.05 % ophthalmic solution PLACE 1 DROP EACH EYE TWICE DAILY  *WAIT 3-5 MINUTES BETWEEN 2 EYE MEDS* *STORE UPRIGHT* 6 mL 11   BD ECLIPSE SYRINGE 25G X 1" 3 ML MISC FOR USE WITH CYANOCOBALAMIN 1 each 11   BLUE GEL 2 % GEL APPLY TOPICALLY TO PAINFUL JOINTS 2 TO 3 TIMES DAILY AS NEEDED 226.8 g 11   chlorhexidine (PERIDEX) 0.12 % solution Use as directed 15 mLs in the mouth or throat 2 (two)  times daily.     cholecalciferol (VITAMIN D) 25 MCG (1000 UNIT) tablet TAKE 1 TABLET BY MOUTH ONCE DAILY FOR SUPPLEMENT 7 tablet 11   clotrimazole-betamethasone (LOTRISONE) cream APPLY TOPICALLY 2 TIMES PER DAY 45 g 2   cyanocobalamin (VITAMIN B12) 1000 MCG/ML injection INJECT 1ML=1000MCG INTRAMUSCULARLY EVERY MONTH ON THE 30TH 1 mL 11   DELSYM 30 MG/5ML liquid TAKE 1 TEASPOONFUL BY MOUTH FOUR TIMES A DAY AS NEEDED *SHAKE WELL* 89 mL 4   diazepam (VALIUM) 5 MG tablet Take 1 tablet (5 mg total) by mouth every 12 (twelve) hours as needed for anxiety. 10 tablet 0   docusate sodium (COLACE) 100 MG capsule TAKE 1 CAPSULE BY MOUTH EVERY OTHER DAY *DO NOT CRUSH OR CHEW* 3 capsule 11   fluticasone (FLONASE) 50 MCG/ACT nasal spray USE 2 SPRAYS EACH NOSTRIL ONCE DAILY *SHAKE GENTLY* 16 g 11   folic acid (FOLVITE) 1 MG tablet TAKE 1 TABLET BY MOUTH EVERY DAY 30 tablet 2   gabapentin (NEURONTIN) 300 MG capsule Take 300 mg by mouth 2 (two) times daily.     GUAIATUSSIN AC 100-10 MG/5ML syrup TAKE 5MLS BY MOUTH THREE TIMES A DAY AS NEEDED FOR COUGH 180 mL 4   ketoconazole (NIZORAL) 2 % cream APPLY TOIPICALLY ONCE DAILY 60 g 10   levothyroxine (SYNTHROID)  112 MCG tablet TAKE 1 TABLET BY MOUTH ONCE DAILY 7 tablet 11   methotrexate (RHEUMATREX) 2.5 MG tablet TAKE 7 TABLETS (17.5 MG) BY MOUTH ONCE A WEEK ON FRIDAY *CAUTION: CHEMOTHERAPY. PROTECT FROM LIGHT* *NOTE DOSE AND FREQUENCY* *HAZARDOUS DRUG: WEAR GLOVES* *DO NOT CF* 28 tablet 11   Multiple Vitamins-Minerals (THEREMS-M) TABS TAKE 1 TABLET BY MOUTH EVERY DAY FOR SUPPLEMENT 90 tablet 1   OYSCO 500 + D 500-5 MG-MCG TABS TAKE 1 TABLET  BY MOUTH ONCE DAILY FOR SUPPLEMENT 30 tablet 11   predniSONE (DELTASONE) 2.5 MG tablet TAKE 1 TABLET BY MOUTH EACH DAY 30 tablet 11   PROLIA 60 MG/ML SOSY injection Inject into the skin.     senna (SENOKOT) 8.6 MG TABS tablet TAKE 2 TABLETS BY MOUTH AT BEDTIME 14 tablet 11   sertraline (ZOLOFT) 25 MG tablet TAKE 1 TABLET BY MOUTH  ONCE DAILY 7 tablet 10   sodium fluoride (PREVIDENT 5000 PLUS) 1.1 % CREA dental cream Place 1 Application onto teeth every evening.     SYMBICORT 160-4.5 MCG/ACT inhaler INHALE 2 PUFFS INTO THE LUNGS TWICE DAILY *USE WITH SPACER* *SHAKE WELL* *RINSE MOUTH AFTER USE* *PRIME PRIOR TO FIRST USE, IF NOT USED >7 DAYS, OR IF DROPPED. PRIME BY SHAKING FOR 5 SECONDS THEN SPRAY A TEST SPRAY, THEN SHAKE AGAIN FOR 5 SECONDS & RELEASE A SECOND TEST SPRAY* *WAIT 1 MINUTE BETWEEN EACH PUFF* **EXPIRES 3 MONTHS AFTER OPENING* 10.2 g 10   atorvastatin (LIPITOR) 20 MG tablet TAKE 1 TABLET BY MOUTH ONCE DAILY 7 tablet 11   ipratropium (ATROVENT) 0.02 % nebulizer solution INHALE 1 VIAL VIA NEBULIZER FOUR TIMES A DAY AS NEEDED FOR WHEEZING OR COUGH 150 mL 11   levalbuterol (XOPENEX) 0.63 MG/3ML nebulizer solution Take 3 mLs (0.63 mg total) by nebulization every 6 (six) hours as needed for wheezing or shortness of breath. 180 mL 11   montelukast (SINGULAIR) 10 MG tablet TAKE 1 TABLET BY MOUTH AT BEDTIME 7 tablet 11   omeprazole (PRILOSEC) 20 MG capsule TAKE 1 CAPSULE BY MOUTH TWICE DAILY *DO NOT CRUSH OR CHEW* 14 capsule 11   dextromethorphan-guaiFENesin (MUCINEX DM) 30-600 MG 12hr tablet Take 1 tablet by mouth 2 (two) times daily as needed for cough. (Patient not taking: Reported on 08/17/2022) 20 tablet 0   traMADol (ULTRAM) 50 MG tablet TAKE 1 TABLET BY MOUTH DAILY AS NEEDED FOR MODERATE PAIN (Patient not taking: Reported on 08/17/2022) 30 tablet 4   famotidine (PEPCID) 20 MG tablet One tablet daily with dinner (Patient not taking: Reported on 08/17/2022) 90 tablet 1   minocycline (MINOCIN) 100 MG capsule Take 1 capsule (100 mg total) by mouth 2 (two) times daily. (Patient not taking: Reported on 08/17/2022) 20 capsule 0   Probiotic Product (ALIGN) 4 MG CAPS 1 tablet daily for 4 weeks (Patient not taking: Reported on 08/17/2022) 30 capsule 0   No facility-administered medications prior to visit.    Review of  Systems  Patient denies headache, fevers, malaise, unintentional weight loss, skin rash, eye pain, sinus congestion and sinus pain, sore throat, dysphagia,  hemoptysis , cough, dyspnea, wheezing, chest pain, palpitations, orthopnea, edema, abdominal pain, nausea, melena, diarrhea, constipation, flank pain, dysuria, hematuria, urinary  Frequency, nocturia, numbness, tingling, seizures,  Focal weakness, Loss of consciousness,  Tremor, insomnia, depression, anxiety, and suicidal ideation.     Objective:  BP (!) 90/56   Pulse 63   Temp 97.8 F (36.6 C) (Oral)   Ht 4\' 8"  (1.422 m)  Wt 140 lb 9.6 oz (63.8 kg)   LMP 04/11/1981   SpO2 94%   BMI 31.52 kg/m   Physical Exam Vitals reviewed.  Constitutional:      General: She is not in acute distress.    Appearance: Normal appearance. She is obese. She is not ill-appearing, toxic-appearing or diaphoretic.  HENT:     Head: Normocephalic.  Eyes:     General: No scleral icterus.       Right eye: No discharge.        Left eye: No discharge.     Conjunctiva/sclera: Conjunctivae normal.  Cardiovascular:     Rate and Rhythm: Normal rate and regular rhythm.     Heart sounds: Normal heart sounds.  Pulmonary:     Effort: Pulmonary effort is normal. No respiratory distress.     Breath sounds: Normal breath sounds.  Musculoskeletal:        General: Deformity present. Normal range of motion.     Right hand: Deformity present.     Left hand: Deformity present.     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  Neurological:     General: No focal deficit present.     Mental Status: She is alert and oriented to person, place, and time. Mental status is at baseline.  Psychiatric:        Mood and Affect: Mood normal.        Behavior: Behavior normal.        Thought Content: Thought content normal.        Judgment: Judgment normal.    Assessment & Plan:  Routine general medical examination at a health  care facility Assessment & Plan: age appropriate education and counseling updated, referrals for preventative services and immunizations addressed, dietary and smoking counseling addressed, most recent labs reviewed.  I have personally reviewed and have noted:   1) the patient's medical and social history 2) The pt's use of alcohol, tobacco, and illicit drugs 3) The patient's current medications and supplements 4) Functional ability including ADL's, fall risk, home safety risk, hearing and visual impairment 5) Diet and physical activities 6) Evidence for depression or mood disorder 7) The patient's height, weight, and BMI have been recorded in the chart   I have made referrals, and provided counseling and education based on review of the above   Morbid obesity ([place]) Assessment & Plan: Weight has been   improving despite her limitations secondary to Down's syndrome and life in a group .  Encourage given.    Moderate persistent chronic asthma with acute exacerbation Assessment & Plan: Changing maintenance inhaler from Symbicort 60/4.5 to advair HFA 230 for insurance purposes . She has had no recent exacerbations   Abdominal aortic atherosclerosis [facility]) Assessment & Plan: Reviewed previous findings of prior CT scan today..  Patient is tolerating high potency statin therapy    Status post total left knee replacement Assessment & Plan: Left knee replacement was successful, but  patient remains dependent on  ambulating with a walker    Other osteoporosis without current pathological fracture Assessment & Plan: Managed with [facility] .Following 10 yrs of bisphosphonate therapy.  History of fractures in 2006 .   Last vitamin D Lab Results  Component Value Date   VD25OH 37.52 07/06/2022      Other orders -     Atorvastatin Calcium; Take 1 tablet (20 mg total) by mouth daily.  Dispense: 30 tablet;  Refill: 11 -     Montelukast Sodium; Take 1 tablet (10 mg total) by mouth at bedtime.   Dispense: 30 tablet; Refill: 11 -     Omeprazole; TAKE 1 CAPSULE BY MOUTH TWICE DAILY *DO NOT CRUSH OR CHEW*  Dispense: 60 capsule; Refill: 11 -     Ipratropium Bromide; INHALE 1 VIAL VIA NEBULIZER FOUR TIMES A DAY AS NEEDED FOR WHEEZING OR COUGH  Dispense: 150 mL; Refill: 11 -     Levalbuterol HCl; Take 3 mLs (0.63 mg total) by nebulization every 8 (eight) hours as needed for wheezing or shortness of breath.  Dispense: 180 mL; Refill: 11 -     Fluticasone-Salmeterol; Inhale 2 puffs into the lungs 2 (two) times daily.  Dispense: 1 each; Refill: 12      Follow-up: Return in about 6 months (around 02/17/2023).   Crecencio Mc, MD

## 2022-08-17 NOTE — Patient Instructions (Addendum)
You have lost 2 lbs since your last visit at 16 lbs compared to  February 2022!  GOOD JOB!    Stay away from daily cookies and ice cream.  LIMIT YOURSELF TO One dessert  per day (EXCEPT ON YOUR BIRTHDAY)  Walk every day FOR EXERCISE.    Symbicort has been changed to Advair for your asthma .  2 PUFFS TWICE DAILY

## 2022-08-17 NOTE — Assessment & Plan Note (Signed)
Changing maintenance inhaler from Symbicort 60/4.5 to advair HFA 230 for insurance purposes . She has had no recent exacerbations

## 2022-08-17 NOTE — Assessment & Plan Note (Signed)
Reviewed previous findings of prior CT scan today..  Patient is tolerating high potency statin therapy

## 2022-08-17 NOTE — Assessment & Plan Note (Signed)

## 2022-08-17 NOTE — Assessment & Plan Note (Signed)
Weight has been  IM[PROVING  she is down 16 lbs compared to Feb 2022.   Becuase she has Down's syndrome and lives in a group  Home, there are no opportunities for regular exercise . BMI is 37 and she has now had bilateral knee replacements. Recommended cutting back on desserts and encourage mother to seek out opportunities ot engage her in exercise.

## 2022-08-19 NOTE — Assessment & Plan Note (Signed)
Managed with [facility] .Following 10 yrs of bisphosphonate therapy.  History of fractures in 2006 .   Last vitamin D Lab Results  Component Value Date   VD25OH 37.52 07/06/2022

## 2022-08-19 NOTE — Assessment & Plan Note (Signed)
Left knee replacement was successful, but  patient remains dependent on  ambulating with a walker

## 2022-09-07 ENCOUNTER — Other Ambulatory Visit: Payer: Self-pay | Admitting: Internal Medicine

## 2022-09-08 DIAGNOSIS — Z0279 Encounter for issue of other medical certificate: Secondary | ICD-10-CM

## 2022-09-21 ENCOUNTER — Other Ambulatory Visit: Payer: Self-pay | Admitting: Internal Medicine

## 2022-12-14 ENCOUNTER — Other Ambulatory Visit: Payer: Self-pay | Admitting: Internal Medicine

## 2022-12-20 ENCOUNTER — Other Ambulatory Visit: Payer: Self-pay | Admitting: Internal Medicine

## 2022-12-20 DIAGNOSIS — Z1231 Encounter for screening mammogram for malignant neoplasm of breast: Secondary | ICD-10-CM

## 2022-12-23 DIAGNOSIS — Q909 Down syndrome, unspecified: Secondary | ICD-10-CM | POA: Diagnosis not present

## 2022-12-23 DIAGNOSIS — M810 Age-related osteoporosis without current pathological fracture: Secondary | ICD-10-CM | POA: Diagnosis not present

## 2022-12-23 DIAGNOSIS — Z7982 Long term (current) use of aspirin: Secondary | ICD-10-CM | POA: Diagnosis not present

## 2022-12-23 DIAGNOSIS — Z79899 Other long term (current) drug therapy: Secondary | ICD-10-CM | POA: Diagnosis not present

## 2022-12-23 DIAGNOSIS — J449 Chronic obstructive pulmonary disease, unspecified: Secondary | ICD-10-CM | POA: Diagnosis not present

## 2022-12-23 DIAGNOSIS — K219 Gastro-esophageal reflux disease without esophagitis: Secondary | ICD-10-CM | POA: Diagnosis not present

## 2022-12-23 DIAGNOSIS — M79631 Pain in right forearm: Secondary | ICD-10-CM | POA: Diagnosis not present

## 2022-12-23 DIAGNOSIS — Z7951 Long term (current) use of inhaled steroids: Secondary | ICD-10-CM | POA: Diagnosis not present

## 2022-12-23 DIAGNOSIS — Z791 Long term (current) use of non-steroidal anti-inflammatories (NSAID): Secondary | ICD-10-CM | POA: Diagnosis not present

## 2022-12-23 DIAGNOSIS — E079 Disorder of thyroid, unspecified: Secondary | ICD-10-CM | POA: Diagnosis not present

## 2022-12-23 DIAGNOSIS — Z79631 Long term (current) use of antimetabolite agent: Secondary | ICD-10-CM | POA: Diagnosis not present

## 2022-12-23 DIAGNOSIS — M1990 Unspecified osteoarthritis, unspecified site: Secondary | ICD-10-CM | POA: Diagnosis not present

## 2022-12-23 DIAGNOSIS — M064 Inflammatory polyarthropathy: Secondary | ICD-10-CM | POA: Diagnosis not present

## 2022-12-28 ENCOUNTER — Other Ambulatory Visit: Payer: Self-pay | Admitting: Family

## 2023-01-05 ENCOUNTER — Ambulatory Visit: Payer: Medicare Other | Admitting: Internal Medicine

## 2023-01-07 ENCOUNTER — Other Ambulatory Visit: Payer: Self-pay | Admitting: Internal Medicine

## 2023-01-10 NOTE — Telephone Encounter (Signed)
Last filled by a historical provider Last OV: 08/17/2022 Next OV: 02/21/2023

## 2023-01-13 ENCOUNTER — Encounter (INDEPENDENT_AMBULATORY_CARE_PROVIDER_SITE_OTHER): Payer: Self-pay

## 2023-01-14 ENCOUNTER — Other Ambulatory Visit: Payer: Self-pay | Admitting: Internal Medicine

## 2023-01-18 ENCOUNTER — Encounter: Payer: Self-pay | Admitting: Family

## 2023-01-18 ENCOUNTER — Ambulatory Visit (INDEPENDENT_AMBULATORY_CARE_PROVIDER_SITE_OTHER): Payer: Medicare Other | Admitting: Family

## 2023-01-18 ENCOUNTER — Ambulatory Visit
Admission: RE | Admit: 2023-01-18 | Discharge: 2023-01-18 | Disposition: A | Discharge status: Home / Self Care | Payer: Medicare Other | Source: Ambulatory Visit | Attending: Internal Medicine | Admitting: Internal Medicine

## 2023-01-18 VITALS — BP 124/76 | HR 64 | Temp 98.7°F | Ht <= 58 in | Wt 133.6 lb

## 2023-01-18 DIAGNOSIS — Z1231 Encounter for screening mammogram for malignant neoplasm of breast: Secondary | ICD-10-CM | POA: Diagnosis not present

## 2023-01-18 DIAGNOSIS — S80861A Insect bite (nonvenomous), right lower leg, initial encounter: Secondary | ICD-10-CM

## 2023-01-18 DIAGNOSIS — W57XXXA Bitten or stung by nonvenomous insect and other nonvenomous arthropods, initial encounter: Secondary | ICD-10-CM | POA: Diagnosis not present

## 2023-01-18 DIAGNOSIS — L03115 Cellulitis of right lower limb: Secondary | ICD-10-CM

## 2023-01-18 MED ORDER — CEPHALEXIN 500 MG PO CAPS: 500.0000 mg | ORAL_CAPSULE | Freq: Three times a day (TID) | ORAL | 0 refills | Status: DC

## 2023-01-18 NOTE — Progress Notes (Signed)
Acute Office Visit  Subjective:     Patient ID: Cindy Oconnor, female    DOB: [birth year], 52 y.o.   MRN: [id number]  Chief Complaint  Patient presents with  . Insect Bite    Right leg    HPI Patient is in today with complaints of redness, swelling, and tenderness to her right lower leg after being bit by mosquitoes this past weekend.  The areas are tender to touch.  She describes them as itchy and has been scratching them.  Has been applying Lotrisone cream since yesterday.  Unsure if it is helping at this point.  Review of Systems  Respiratory: Negative.    Cardiovascular: Negative.   Musculoskeletal:        Right lower leg tender, red,   Neurological: Negative.   Endo/Heme/Allergies: Negative.   Psychiatric/Behavioral: Negative.    All other systems reviewed and are negative. Past Medical History:  Diagnosis Date  . Arthritis   . Asthma    unspecified  . Blood clot in vein   . Chicken pox   . Colitis   . Collagen vascular disease (HCC)   . COPD (chronic obstructive pulmonary disease) (HCC)   . COVID-19 virus detected 04/30/2019   Nov 23 tested positive,  LRI symptoms   . Deafness in left ear   . Disease of thyroid gland   . Down syndrome   . DVT (deep venous thrombosis) (HCC)   . Fractures   . GERD (gastroesophageal reflux disease)   . Hypothyroidism    unspecified  . 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 Positive anti-CCP antibodies, negative rheumatoid factor, positive FANA. b. Methotrexate, Prednisone, Plaquenil.   . Joint pain   . Lupus (HCC) 1995  . Osteoarthritis 01/30/2014   a. Lumbar disc disease. b. Trigger nodules  . Osteoporosis 01/30/2014   a. Boniva  . RA (rheumatoid arthritis) (HCC)   . Shingles   . Ulcerative (chronic) enterocolitis (HCC)   . Ulcerative colitis (HCC)     Social History   Socioeconomic History  . Marital status: Single    Spouse name: Not on file  . Number of children: 0  . Years of education: Not on file  .  Highest education level: Not on file  Occupational History  . Not on file  Tobacco Use  . Smoking status: Never  . Smokeless tobacco: Never  Vaping Use  . Vaping status: Never Used  Substance and Sexual Activity  . Alcohol use: No  . Drug use: No  . Sexual activity: Not on file  Other Topics Concern  . Not on file  Social History Narrative   Lives at [facility] group home.   Social Determinants of Health   Financial Resource Strain: Not on file  Food Insecurity: Not on file  Transportation Needs: Not on file  Physical Activity: Not on file  Stress: Not on file  Social Connections: Not on file  Intimate Partner Violence: Not on file    Past Surgical History:  Procedure Laterality Date  . ABDOMINAL HYSTERECTOMY    . ABDOMINAL HYSTERECTOMY W/ PARTIAL VAGINACTOMY  1992  . BUNIONECTOMY Bilateral    Bunionectomy great toe  . COLONOSCOPY  04/16/2013   Hx of ulcerative colitis - repeat 2 years per Dr. Shelle Iron  . COLONOSCOPY  09/24/2003   Dr. Ruffin Frederick  . FOOT SURGERY Bilateral    x2  . HALLUX VALGUS CORRECTION    . KNEE ARTHROPLASTY Left 01/10/2017   Procedure: COMPUTER  ASSISTED TOTAL KNEE ARTHROPLASTY;  Surgeon: Donato Heinz, MD;  Location: [facility] ORS;  Service: Orthopedics;  Laterality: Left;  . KNEE ARTHROPLASTY Right 08/10/2017   Procedure: COMPUTER ASSISTED TOTAL KNEE ARTHROPLASTY;  Surgeon: Donato Heinz, MD;  Location: [facility] ORS;  Service: Orthopedics;  Laterality: Right;    Family History  Problem Relation Age of Onset  . Heart disease Mother   . Diabetes Mother   . Breast cancer Mother 67  . Heart disease Father   . Diabetes Father   . Alcohol abuse Maternal Grandmother   . Arthritis Maternal Grandmother   . Stroke Maternal Grandmother   . Diabetes Maternal Grandmother   . Alcohol abuse Maternal Grandfather   . Arthritis Maternal Grandfather   . Stroke Maternal Grandfather   . Diabetes Maternal Grandfather     Allergies  Allergen  Reactions  . Erythromycin Shortness Of Breath, Rash and Other (See Comments)    Reaction:  Unknown    . Oxycontin [Oxycodone]   . Tramadol Nausea And Vomiting  . Albuterol Rash and Other (See Comments)    "Jittery"     Current Outpatient Medications on File Prior to Visit  Medication Sig Dispense Refill  . acetaminophen (TYLENOL) 650 MG CR tablet TAKE 1 TABLET BY MOUTH EVERY EIGHT HOURS 21 tablet 10  . amoxicillin (AMOXIL) 500 MG capsule TAKE 4 CAPSULES (2000MG ) BY MOUTH AT ONCE 1-HR PRIOR TO DENTAL APPT 4 capsule 11  . ARTHRITIS PAIN RELIEVER 1 % GEL APPLY 2 G TOPICALLY 4 (FOUR) TIMES DAILY AS NEEDED FOR JOINT PAIN. 150 g 11  . aspirin EC (ASPIRIN LOW DOSE) 81 MG tablet TAKE 1 TABLET BY MOUTH ONCE DAILY 90 tablet 3  . atorvastatin (LIPITOR) 20 MG tablet Take 1 tablet (20 mg total) by mouth daily. 30 tablet 11  . Ayr Saline Nasal No-Drip GEL INSTILL INTO NOSE TWICE DAILY TO MOISTURIZE LINING OF NOSE;INSTILL INTO NOSE TWICE A DAY AS NEEDED FOR DRY NOSE 22 mL 11  . azelastine (OPTIVAR) 0.05 % ophthalmic solution PLACE 1 DROP EACH EYE TWICE DAILY  *WAIT 3-5 MINUTES BETWEEN 2 EYE MEDS* *STORE UPRIGHT* 6 mL 11  . BD ECLIPSE SYRINGE 25G X 1" 3 ML MISC FOR USE WITH CYANOCOBALAMIN 1 each 11  . BLUE GEL 2 % GEL APPLY TOPICALLY TO PAINFUL JOINTS 2 TO 3 TIMES DAILY AS NEEDED 226.8 g 11  . chlorhexidine (PERIDEX) 0.12 % solution Use as directed 15 mLs in the mouth or throat 2 (two) times daily.    . CHOLECALCIFEROL 25 MCG (1000 UNIT) tablet TAKE 1 TABLET BY MOUTH ONCE DAILY FOR SUPPLEMENT 7 tablet 10  . clotrimazole-betamethasone (LOTRISONE) cream APPLY TOPICALLY 2 TIMES PER DAY 45 g 2  . cyanocobalamin (VITAMIN B12) 1000 MCG/ML injection INJECT 1ML=1000MCG INTRAMUSCULARLY EVERY MONTH ON THE 30TH 1 mL 11  . DELSYM 30 MG/5ML liquid TAKE 1 TEASPOONFUL BY MOUTH FOUR TIMES A DAY AS NEEDED *SHAKE WELL* 89 mL 4  . diazepam (VALIUM) 5 MG tablet Take 1 tablet (5 mg total) by mouth every 12 (twelve) hours as  needed for anxiety. 10 tablet 0  . docusate sodium (COLACE) 100 MG capsule TAKE 1 CAPSULE BY MOUTH EVERY OTHER DAY *DO NOT CRUSH OR CHEW* 3 capsule 10  . fluticasone (FLONASE) 50 MCG/ACT nasal spray USE 2 SPRAYS EACH NOSTRIL ONCE DAILY *SHAKE GENTLY* 16 g 11  . fluticasone-salmeterol (ADVAIR HFA) 230-21 MCG/ACT inhaler Inhale 2 puffs into the lungs 2 (two) times daily. 1 each 12  .  folic acid (FOLVITE) 1 MG tablet TAKE 1 TABLET BY MOUTH EVERY DAY 30 tablet 2  . gabapentin (NEURONTIN) 300 MG capsule Take 300 mg by mouth 2 (two) times daily.    Wilber Oliphant AC 100-10 MG/5ML syrup TAKE BY MOUTH THREE TIMES A DAY AS NEEDED FOR COUGH 180 mL 4  . ipratropium (ATROVENT) 0.02 % nebulizer solution INHALE 1 VIAL VIA NEBULIZER FOUR TIMES A DAY AS NEEDED FOR WHEEZING OR COUGH 150 mL 11  . ketoconazole (NIZORAL) 2 % cream APPLY TOIPICALLY ONCE DAILY 60 g 10  . levalbuterol (XOPENEX) 0.63 MG/3ML nebulizer solution Take 3 mLs (0.63 mg total) by nebulization every 8 (eight) hours as needed for wheezing or shortness of breath. 180 mL 11  . levothyroxine (SYNTHROID) 112 MCG tablet TAKE 1 TABLET BY MOUTH ONCE DAILY 7 tablet 11  . methotrexate (RHEUMATREX) 2.5 MG tablet TAKE 7 TABLETS (17.5 MG) BY MOUTH ONCE A WEEK ON FRIDAY *CAUTION: CHEMOTHERAPY. PROTECT FROM LIGHT* *NOTE DOSE AND FREQUENCY* *HAZARDOUS DRUG: WEAR GLOVES* *DO NOT CF* 28 tablet 11  . montelukast (SINGULAIR) 10 MG tablet Take 1 tablet (10 mg total) by mouth at bedtime. 30 tablet 11  . Multiple Vitamins-Minerals (THERA-TABS M) TABS TAKE 1 TABLET BY MOUTH ONCE DAILY  FOR SUPPLEMENT *TAKE WITH FOOD* 3 tablet 11  . omeprazole (PRILOSEC) 20 MG capsule TAKE 1 CAPSULE BY MOUTH TWICE DAILY *DO NOT CRUSH OR CHEW* 60 capsule 11  . OYSCO 500 + D 500-5 MG-MCG TABS TAKE 1 TABLET  BY MOUTH ONCE DAILY FOR SUPPLEMENT 7 tablet 10  . predniSONE (DELTASONE) 2.5 MG tablet TAKE 1 TABLET BY MOUTH EACH DAY 30 tablet 11  . PROLIA 60 MG/ML SOSY injection Inject into the  skin.    . SENNA-TIME 8.6 MG tablet TAKE 2 TABLETS BY MOUTH AT BEDTIME 14 tablet 10  . sertraline (ZOLOFT) 25 MG tablet TAKE 1 TABLET BY MOUTH ONCE DAILY 7 tablet 10  . sodium fluoride (PREVIDENT 5000 PLUS) 1.1 % CREA dental cream Place 1 Application onto teeth every evening.    . SYMBICORT 160-4.5 MCG/ACT inhaler INHALE 2 PUFFS INTO THE LUNGS TWICE DAILY *USE WITH SPACER* *SHAKE WELL* *RINSE MOUTH AFTER USE* *PRIME PRIOR TO FIRST USE, IF NOT USED >7 DAYS, OR IF DROPPED. PRIME BY SHAKING FOR 5 SECONDS THEN SPRAY A TEST SPRAY, THEN SHAKE AGAIN FOR 5 SECONDS & RELEASE A SECOND TEST SPRAY* *WAIT 1 MINUTE BETWEEN EACH PUFF* **EXPIRES 3 MONTHS AFTER OPENING* 10.2 g 10  . traMADol (ULTRAM) 50 MG tablet TAKE 1 TABLET BY MOUTH DAILY AS NEEDED FOR MODERATE PAIN 30 tablet 4   No current facility-administered medications on file prior to visit.    BP 124/76   Pulse 64   Temp 98.7 F (37.1 C) (Oral)   Ht 4\' 8"  (1.422 m)   Wt 133 lb 9.6 oz (60.6 kg)   LMP 04/11/1981   SpO2 96%   BMI 29.95 kg/m chart      Objective:    BP 124/76   Pulse 64   Temp 98.7 F (37.1 C) (Oral)   Ht 4\' 8"  (1.422 m)   Wt 133 lb 9.6 oz (60.6 kg)   LMP 04/11/1981   SpO2 96%   BMI 29.95 kg/m    Physical Exam Vitals reviewed. Exam conducted with a chaperone present.  Constitutional:      Appearance: Normal appearance. She is normal weight.  Cardiovascular:     Rate and Rhythm: Normal rate and regular rhythm.  Pulmonary:     Effort: Pulmonary effort is normal.     Breath sounds: Normal breath sounds.  Abdominal:     General: Abdomen is flat.  Skin:    General: Skin is warm and dry.          Comments: Mildly swollen, erythematous areas of the right lower leg.  Tender to touch.  No drainage or discharge.  Neurological:     General: No focal deficit present.     Mental Status: She is alert and oriented to person, place, and time. Mental status is at baseline.  Psychiatric:        Mood and Affect: Mood  normal.        Behavior: Behavior normal.   No results found for any visits on 01/18/23.      Assessment & Plan:   Problem List Items Addressed This Visit   None Visit Diagnoses     Cellulitis of right leg    -  Primary   Insect bite of right lower leg, initial encounter           Meds ordered this encounter  Medications  . cephALEXin (KEFLEX) 500 MG capsule    Sig: Take 1 capsule (500 mg total) by mouth 3 (three) times daily.    Dispense:  21 capsule    Refill:  0   Call the office if symptoms worsen or persist.  Recheck as scheduled.  And sooner as needed. No follow-ups on file.  Eulis Foster, FNP

## 2023-02-07 DIAGNOSIS — M79674 Pain in right toe(s): Secondary | ICD-10-CM | POA: Diagnosis not present

## 2023-02-07 DIAGNOSIS — M25871 Other specified joint disorders, right ankle and foot: Secondary | ICD-10-CM | POA: Diagnosis not present

## 2023-02-07 DIAGNOSIS — M2042 Other hammer toe(s) (acquired), left foot: Secondary | ICD-10-CM | POA: Diagnosis not present

## 2023-02-07 DIAGNOSIS — B351 Tinea unguium: Secondary | ICD-10-CM | POA: Diagnosis not present

## 2023-02-07 DIAGNOSIS — M79671 Pain in right foot: Secondary | ICD-10-CM | POA: Diagnosis not present

## 2023-02-07 DIAGNOSIS — M2041 Other hammer toe(s) (acquired), right foot: Secondary | ICD-10-CM | POA: Diagnosis not present

## 2023-02-07 DIAGNOSIS — M79675 Pain in left toe(s): Secondary | ICD-10-CM | POA: Diagnosis not present

## 2023-02-17 ENCOUNTER — Ambulatory Visit: Payer: Medicare Other | Admitting: Internal Medicine

## 2023-02-21 ENCOUNTER — Ambulatory Visit (INDEPENDENT_AMBULATORY_CARE_PROVIDER_SITE_OTHER): Payer: Medicare Other | Admitting: Internal Medicine

## 2023-02-21 ENCOUNTER — Encounter: Payer: Self-pay | Admitting: Internal Medicine

## 2023-02-21 ENCOUNTER — Other Ambulatory Visit: Payer: Self-pay | Admitting: Internal Medicine

## 2023-02-21 VITALS — BP 112/64 | HR 64 | Ht <= 58 in | Wt 132.6 lb

## 2023-02-21 DIAGNOSIS — Z79899 Other long term (current) drug therapy: Secondary | ICD-10-CM | POA: Diagnosis not present

## 2023-02-21 DIAGNOSIS — Z23 Encounter for immunization: Secondary | ICD-10-CM | POA: Diagnosis not present

## 2023-02-21 DIAGNOSIS — D7589 Other specified diseases of blood and blood-forming organs: Secondary | ICD-10-CM | POA: Diagnosis not present

## 2023-02-21 DIAGNOSIS — H60542 Acute eczematoid otitis externa, left ear: Secondary | ICD-10-CM

## 2023-02-21 DIAGNOSIS — M818 Other osteoporosis without current pathological fracture: Secondary | ICD-10-CM

## 2023-02-21 DIAGNOSIS — R7301 Impaired fasting glucose: Secondary | ICD-10-CM

## 2023-02-21 DIAGNOSIS — E785 Hyperlipidemia, unspecified: Secondary | ICD-10-CM

## 2023-02-21 DIAGNOSIS — K760 Fatty (change of) liver, not elsewhere classified: Secondary | ICD-10-CM | POA: Diagnosis not present

## 2023-02-21 DIAGNOSIS — M1990 Unspecified osteoarthritis, unspecified site: Secondary | ICD-10-CM | POA: Diagnosis not present

## 2023-02-21 DIAGNOSIS — E034 Atrophy of thyroid (acquired): Secondary | ICD-10-CM | POA: Diagnosis not present

## 2023-02-21 DIAGNOSIS — D518 Other vitamin B12 deficiency anemias: Secondary | ICD-10-CM

## 2023-02-21 DIAGNOSIS — E663 Overweight: Secondary | ICD-10-CM | POA: Diagnosis not present

## 2023-02-21 DIAGNOSIS — K5190 Ulcerative colitis, unspecified, without complications: Secondary | ICD-10-CM

## 2023-02-21 LAB — COMPREHENSIVE METABOLIC PANEL
ALT: 19 U/L (ref 0–35)
AST: 31 U/L (ref 0–37)
Albumin: 4 g/dL (ref 3.5–5.2)
Alkaline Phosphatase: 58 U/L (ref 39–117)
BUN: 16 mg/dL (ref 6–23)
CO2: 31 mEq/L (ref 19–32)
Calcium: 9.6 mg/dL (ref 8.4–10.5)
Chloride: 101 mEq/L (ref 96–112)
Creatinine, Ser: 0.92 mg/dL (ref 0.40–1.20)
GFR: 70.59 mL/min (ref >60.00)
Glucose, Bld: 98 mg/dL (ref 70–99)
Potassium: 4.1 mEq/L (ref 3.5–5.1)
Sodium: 141 mEq/L (ref 135–145)
Total Bilirubin: 0.5 mg/dL (ref 0.2–1.2)
Total Protein: 6.9 g/dL (ref 6.0–8.3)

## 2023-02-21 LAB — LIPID PANEL
Cholesterol: 121 mg/dL (ref 0–200)
HDL: 54.1 mg/dL (ref >39.00)
LDL Cholesterol: 47 mg/dL (ref 0–99)
NonHDL: 66.52
Total CHOL/HDL Ratio: 2
Triglycerides: 100 mg/dL (ref 0.0–149.0)
VLDL: 20 mg/dL (ref 0.0–40.0)

## 2023-02-21 LAB — B12 AND FOLATE PANEL
Folate: >24.2 ng/mL (ref >5.9)
Vitamin B-12: 601 pg/mL (ref 211–911)

## 2023-02-21 LAB — TSH: TSH: 4.81 u[IU]/mL (ref 0.35–5.50)

## 2023-02-21 LAB — LDL CHOLESTEROL, DIRECT: Direct LDL: 55 mg/dL

## 2023-02-21 LAB — HEMOGLOBIN A1C: Hgb A1c MFr Bld: 5.6 % (ref 4.6–6.5)

## 2023-02-21 MED ORDER — MOMETASONE FUROATE 0.1 % EX CREA
TOPICAL_CREAM | CUTANEOUS | 1 refills | Status: DC
Start: 2023-02-21 — End: 2023-02-21

## 2023-02-21 MED ORDER — LEVOTHYROXINE SODIUM 112 MCG PO TABS: 112.0000 ug | ORAL_TABLET | Freq: Every day | ORAL | 11 refills | Status: DC

## 2023-02-21 MED ORDER — MOMETASONE FUROATE 0.1 % EX CREA
TOPICAL_CREAM | CUTANEOUS | 1 refills | Status: AC
Start: 2023-02-21 — End: ?

## 2023-02-21 MED ORDER — "BD ECLIPSE SYRINGE 25G X 1"" 3 ML MISC": 11 refills | Status: DC

## 2023-02-21 MED ORDER — AZELASTINE HCL 0.05 % OP SOLN: OPHTHALMIC | 11 refills | Status: DC

## 2023-02-21 MED ORDER — CYANOCOBALAMIN 1000 MCG/ML IJ SOLN: INTRAMUSCULAR | 11 refills | Status: DC

## 2023-02-21 NOTE — Assessment & Plan Note (Signed)
Managed with [facility] .Following 10 yrs of bisphosphonate therapy.  History of fractures in 2006 .   Last vitamin D Lab Results  Component Value Date   VD25OH 37.52 07/06/2022

## 2023-02-21 NOTE — Assessment & Plan Note (Signed)
Her diagnosis has changed several times , upon review of various rheumatologists and orthopedist notes from 2018 forward.  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 She is taking MTX and folic acid , prescribed by Rheum and surveillance labs have been done

## 2023-02-21 NOTE — Assessment & Plan Note (Signed)
SE HAS LOST 27 LB INTENTIONAL SINCE 2021

## 2023-02-21 NOTE — Assessment & Plan Note (Signed)
Thyroid function has been  WNL on current dose. Repeat level is overdue    Lab Results  Component Value Date   TSH 4.81 02/21/2023

## 2023-02-21 NOTE — Assessment & Plan Note (Signed)
Presumed by changes noted on recent CT scan. .  Current liver enzymes are normal and all modifiable risk factors have been addressed today    Lab Results  Component Value Date   ALT 19 02/21/2023   AST 31 02/21/2023   ALKPHOS 58 02/21/2023   BILITOT 0.5 02/21/2023

## 2023-02-21 NOTE — Assessment & Plan Note (Signed)
She has macrocytosis,  MV 112 and AST of 35 by July 2025 labs (taking methotrexate)   will check b12 and folate levels today

## 2023-02-21 NOTE — Assessment & Plan Note (Addendum)
Complicated by   use of methotrexate.  Checking b12 and folate levels today

## 2023-02-21 NOTE — Progress Notes (Signed)
Subjective:  Patient ID: Cindy Oconnor, female    DOB: 1971-04-10  Age: 52 y.o. MRN: [id number]  CC: The primary encounter diagnosis was Hypothyroidism due to acquired atrophy of thyroid. Diagnoses of Long-term use of high-risk medication, Impaired fasting glucose, Dyslipidemia, Chronic inflammatory arthritis, Macrocytosis, Eczema of left external ear, Overweight (BMI 25.0-29.9), Macrocytic anemia with vitamin B12 deficiency, Need for influenza vaccination, Hepatic steatosis, Other osteoporosis without current pathological fracture, and Chronic ulcerative colitis without complication, unspecified location [facility]) were also pertinent to this visit.   HPI Cindy Oconnor presents for  Chief Complaint  Patient presents with   Medical Management of Chronic Issues    6 month follow up    1) Recent cellulitis of RLE treated August 20 with keflex.   Resolved  2) Grief :  her POA Gloriann Loan ,  died since her last visit with me.   2) aortic atherosclerosis:  Reviewed findings of prior CT scan today..  Patient is tolerating high potency statin therapy  with lipitor 20 mg   3)  Asthma:  no recent exacerbations   4) Obesity:  she has   lost 27 lbs since 2021 , bmi now < 30 ;  has been avoiding sweets   5) left ear itching,  eczematous changes,  bleeding   6) osteoporosis:  getting Prolia injections at the group home.  The attendant is unclear about the last injection .   Outpatient Medications Prior to Visit  Medication Sig Dispense Refill   acetaminophen (TYLENOL) 650 MG CR tablet TAKE 1 TABLET BY MOUTH EVERY EIGHT HOURS 21 tablet 10   amoxicillin (AMOXIL) 500 MG capsule TAKE 4 CAPSULES (2000MG ) BY MOUTH AT ONCE 1-HR PRIOR TO DENTAL APPT 4 capsule 11   ARTHRITIS PAIN RELIEVER 1 % GEL APPLY 2 G TOPICALLY 4 (FOUR) TIMES DAILY AS NEEDED FOR JOINT PAIN. 150 g 11   aspirin EC (ASPIRIN LOW DOSE) 81 MG tablet TAKE 1 TABLET BY MOUTH ONCE DAILY 90 tablet 3   atorvastatin (LIPITOR) 20 MG tablet  Take 1 tablet (20 mg total) by mouth daily. 30 tablet 11   Ayr Saline Nasal No-Drip GEL INSTILL INTO NOSE TWICE DAILY TO MOISTURIZE LINING OF NOSE;INSTILL INTO NOSE TWICE A DAY AS NEEDED FOR DRY NOSE 22 mL 11   BLUE GEL 2 % GEL APPLY TOPICALLY TO PAINFUL JOINTS 2 TO 3 TIMES DAILY AS NEEDED 226.8 g 11   chlorhexidine (PERIDEX) 0.12 % solution Use as directed 15 mLs in the mouth or throat 2 (two) times daily.     clotrimazole-betamethasone (LOTRISONE) cream APPLY TOPICALLY 2 TIMES PER DAY 45 g 2   DELSYM 30 MG/5ML liquid TAKE 1 TEASPOONFUL BY MOUTH FOUR TIMES A DAY AS NEEDED *SHAKE WELL* 89 mL 4   diazepam (VALIUM) 5 MG tablet Take 1 tablet (5 mg total) by mouth every 12 (twelve) hours as needed for anxiety. 10 tablet 0   docusate sodium (COLACE) 100 MG capsule TAKE 1 CAPSULE BY MOUTH EVERY OTHER DAY *DO NOT CRUSH OR CHEW* 3 capsule 10   fluticasone (FLONASE) 50 MCG/ACT nasal spray USE 2 SPRAYS EACH NOSTRIL ONCE DAILY *SHAKE GENTLY* 16 g 11   fluticasone-salmeterol (ADVAIR HFA) 230-21 MCG/ACT inhaler Inhale 2 puffs into the lungs 2 (two) times daily. 1 each 12   folic acid (FOLVITE) 1 MG tablet TAKE 1 TABLET BY MOUTH EVERY DAY 30 tablet 2   gabapentin (NEURONTIN) 300 MG capsule Take 300 mg by mouth 2 (two) times daily.  GUAIATUSSIN AC 100-10 MG/5ML syrup TAKE BY MOUTH THREE TIMES A DAY AS NEEDED FOR COUGH 180 mL 4   ipratropium (ATROVENT) 0.02 % nebulizer solution INHALE 1 VIAL VIA NEBULIZER FOUR TIMES A DAY AS NEEDED FOR WHEEZING OR COUGH 150 mL 11   ketoconazole (NIZORAL) 2 % cream APPLY TOIPICALLY ONCE DAILY 60 g 10   levalbuterol (XOPENEX) 0.63 MG/3ML nebulizer solution Take 3 mLs (0.63 mg total) by nebulization every 8 (eight) hours as needed for wheezing or shortness of breath. 180 mL 11   methotrexate (RHEUMATREX) 2.5 MG tablet TAKE 7 TABLETS (17.5 MG) BY MOUTH ONCE A WEEK ON FRIDAY *CAUTION: CHEMOTHERAPY. PROTECT FROM LIGHT* *NOTE DOSE AND FREQUENCY* *HAZARDOUS DRUG: WEAR GLOVES* *DO  NOT CF* 28 tablet 11   montelukast (SINGULAIR) 10 MG tablet Take 1 tablet (10 mg total) by mouth at bedtime. 30 tablet 11   Multiple Vitamins-Minerals (THERA-TABS M) TABS TAKE 1 TABLET BY MOUTH ONCE DAILY  FOR SUPPLEMENT *TAKE WITH FOOD* 3 tablet 11   omeprazole (PRILOSEC) 20 MG capsule TAKE 1 CAPSULE BY MOUTH TWICE DAILY *DO NOT CRUSH OR CHEW* 60 capsule 11   OYSCO 500 + D 500-5 MG-MCG TABS TAKE 1 TABLET  BY MOUTH ONCE DAILY FOR SUPPLEMENT 7 tablet 10   predniSONE (DELTASONE) 2.5 MG tablet TAKE 1 TABLET BY MOUTH EACH DAY 30 tablet 11   PROLIA 60 MG/ML SOSY injection Inject into the skin.     SENNA-TIME 8.6 MG tablet TAKE 2 TABLETS BY MOUTH AT BEDTIME 14 tablet 10   sertraline (ZOLOFT) 25 MG tablet TAKE 1 TABLET BY MOUTH ONCE DAILY 7 tablet 10   sodium fluoride (PREVIDENT 5000 PLUS) 1.1 % CREA dental cream Place 1 Application onto teeth every evening.     azelastine (OPTIVAR) 0.05 % ophthalmic solution PLACE 1 DROP EACH EYE TWICE DAILY  *WAIT 3-5 MINUTES BETWEEN 2 EYE MEDS* *STORE UPRIGHT* 6 mL 11   BD ECLIPSE SYRINGE 25G X 1" 3 ML MISC FOR USE WITH CYANOCOBALAMIN 1 each 11   cyanocobalamin (VITAMIN B12) 1000 MCG/ML injection INJECT 1ML=1000MCG INTRAMUSCULARLY EVERY MONTH ON THE 30TH 1 mL 11   levothyroxine (SYNTHROID) 112 MCG tablet TAKE 1 TABLET BY MOUTH ONCE DAILY 7 tablet 11   cephALEXin (KEFLEX) 500 MG capsule Take 1 capsule (500 mg total) by mouth 3 (three) times daily. (Patient not taking: Reported on 02/21/2023) 21 capsule 0   CHOLECALCIFEROL 25 MCG (1000 UNIT) tablet TAKE 1 TABLET BY MOUTH ONCE DAILY FOR SUPPLEMENT (Patient not taking: Reported on 02/21/2023) 7 tablet 10   SYMBICORT 160-4.5 MCG/ACT inhaler INHALE 2 PUFFS INTO THE LUNGS TWICE DAILY *USE WITH SPACER* *SHAKE WELL* *RINSE MOUTH AFTER USE* *PRIME PRIOR TO FIRST USE, IF NOT USED >7 DAYS, OR IF DROPPED. PRIME BY SHAKING FOR 5 SECONDS THEN SPRAY A TEST SPRAY, THEN SHAKE AGAIN FOR 5 SECONDS & RELEASE A SECOND TEST SPRAY* *WAIT 1 MINUTE  BETWEEN EACH PUFF* **EXPIRES 3 MONTHS AFTER OPENING* (Patient not taking: Reported on 02/21/2023) 10.2 g 10   traMADol (ULTRAM) 50 MG tablet TAKE 1 TABLET BY MOUTH DAILY AS NEEDED FOR MODERATE PAIN (Patient not taking: Reported on 02/21/2023) 30 tablet 4   No facility-administered medications prior to visit.    Review of Systems;  Patient denies headache, fevers, malaise, unintentional weight loss, skin rash, eye pain, sinus congestion and sinus pain, sore throat, dysphagia,  hemoptysis , cough, dyspnea, wheezing, chest pain, palpitations, orthopnea, edema, abdominal pain, nausea, melena, diarrhea, constipation, flank pain, dysuria, hematuria,  urinary  Frequency, nocturia, numbness, tingling, seizures,  Focal weakness, Loss of consciousness,  Tremor, insomnia, depression, anxiety, and suicidal ideation.      Objective:  BP 112/64   Pulse 64   Ht 4\' 8"  (1.422 m)   Wt 132 lb 9.6 oz (60.1 kg)   LMP 04/11/1981   SpO2 96%   BMI 29.73 kg/m   BP Readings from Last 3 Encounters:  02/21/23 112/64  01/18/23 124/76  08/17/22 (!) 90/56    Wt Readings from Last 3 Encounters:  02/21/23 132 lb 9.6 oz (60.1 kg)  01/18/23 133 lb 9.6 oz (60.6 kg)  08/17/22 140 lb 9.6 oz (63.8 kg)    Physical Exam  Lab Results  Component Value Date   HGBA1C 5.6 02/21/2023   HGBA1C 5.4 07/06/2022   HGBA1C 5.7 12/17/2021    Lab Results  Component Value Date   CREATININE 0.92 02/21/2023   CREATININE 0.83 07/06/2022   CREATININE 0.84 12/17/2021    Lab Results  Component Value Date   WBC 7.1 12/17/2021   HGB 12.6 12/17/2021   HCT 38.3 12/17/2021   PLT 296.0 12/17/2021   GLUCOSE 98 02/21/2023   CHOL 121 02/21/2023   TRIG 100.0 02/21/2023   HDL 54.10 02/21/2023   LDLDIRECT 55.0 02/21/2023   LDLCALC 47 02/21/2023   ALT 19 02/21/2023   AST 31 02/21/2023   NA 141 02/21/2023   K 4.1 02/21/2023   CL 101 02/21/2023   CREATININE 0.92 02/21/2023   BUN 16 02/21/2023   CO2 31 02/21/2023   TSH 4.81  02/21/2023   INR 0.95 07/28/2017   HGBA1C 5.6 02/21/2023    MM 3D SCREENING MAMMOGRAM BILATERAL BREAST  Result Date: 01/19/2023 CLINICAL DATA:  Screening. EXAM: DIGITAL SCREENING BILATERAL MAMMOGRAM WITH TOMOSYNTHESIS AND CAD TECHNIQUE: Bilateral screening digital craniocaudal and mediolateral oblique mammograms were obtained. Bilateral screening digital breast tomosynthesis was performed. The images were evaluated with computer-aided detection. COMPARISON:  Previous exam(s). ACR Breast Density Category b: There are scattered areas of fibroglandular density. FINDINGS: There are no findings suspicious for malignancy. IMPRESSION: No mammographic evidence of malignancy. A result letter of this screening mammogram will be mailed directly to the patient. RECOMMENDATION: Screening mammogram in one year. (Code:SM-B-01Y) BI-RADS CATEGORY  1: Negative. Electronically Signed   By: Sherian Rein M.D.   On: 01/19/2023 13:30    Assessment & Plan:  .Hypothyroidism due to acquired atrophy of thyroid Assessment & Plan: Thyroid function has been  WNL on current dose. Repeat level is overdue    Lab Results  Component Value Date   TSH 4.81 02/21/2023     Orders: -     TSH  Long-term use of high-risk medication Assessment & Plan: She has macrocytosis,  MV 112 and AST of 35 by July 2025 labs (taking methotrexate)   will check b12 and folate levels today   Orders: -     Comprehensive metabolic panel  Impaired fasting glucose -     Comprehensive metabolic panel -     Hemoglobin A1c  Dyslipidemia -     Lipid panel -     LDL cholesterol, direct -     Comprehensive metabolic panel  Chronic inflammatory arthritis Assessment & Plan: Her diagnosis has changed several times , upon review of various rheumatologists and orthopedist notes from 2018 forward.  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 She is taking MTX and folic acid , prescribed by Rheum and surveillance labs have  been done    Macrocytosis -     B12 and Folate Panel  Eczema of left external ear -     Mometasone Furoate; APPLY TWO TIMES DAILY TO LEFT OUTER EAR FOR ECZEMA  Dispense: 15 g; Refill: 1  Overweight (BMI 25.0-29.9) Assessment & Plan: SE HAS LOST 27 LB INTENTIONAL SINCE 2021      Macrocytic anemia with vitamin B12 deficiency Assessment & Plan: Complicated by   use of methotrexate.  Checking b12 and folate levels today    Need for influenza vaccination -     Flu vaccine trivalent PF, 6mos and older(Flulaval,Afluria,Fluarix,Fluzone)  Hepatic steatosis Assessment & Plan: Presumed by changes noted on recent CT scan. .  Current liver enzymes are normal and all modifiable risk factors have been addressed today    Lab Results  Component Value Date   ALT 19 02/21/2023   AST 31 02/21/2023   ALKPHOS 58 02/21/2023   BILITOT 0.5 02/21/2023      Other osteoporosis without current pathological fracture Assessment & Plan: Managed with Prolia .Following 10 yrs of bisphosphonate therapy.  History of fractures in 2006 .   Last vitamin D Lab Results  Component Value Date   VD25OH 37.52 07/06/2022      Chronic ulcerative colitis without complication, unspecified location [facility]) Assessment & Plan: Symptoms have been quiescent.  Her Last colonoscopy was in 2014 and 2 yr follow up was advised.  She was referred to to [facility] gastroenterology In 2019 but was not seen due ot her foster mother's health issues    Other orders -     Azelastine HCl; PLACE 1 DROP EACH EYE TWICE DAILY  *WAIT 3-5 MINUTES BETWEEN 2 EYE MEDS* *STORE UPRIGHT*  Dispense: 6 mL; Refill: 11 -     BD Eclipse Syringe; FOR USE WITH CYANOCOBALAMIN  Dispense: 1 each; Refill: 11 -     Cyanocobalamin; INJECT 1ML=1000MCG INTRAMUSCULARLY EVERY MONTH ON THE 30TH  Dispense: 1 mL; Refill: 11 -     Levothyroxine Sodium; Take 1 tablet (112 mcg total) by mouth daily.  Dispense: 7 tablet; Refill: 11     I provided 30 minutes of  face-to-face time during this encounter reviewing patient's last visit with me, patient's  most recent visit with rheumatology  previous  recent surgical and non surgical procedures, previous  labs and imaging studies, counseling on currently addressed issues,  and post visit ordering to diagnostics and therapeutics .   Follow-up: Return in about 6 months (around 08/21/2023).   Sherlene Shams, MD

## 2023-02-21 NOTE — Patient Instructions (Addendum)
Cindy Oconnor's rheumatologist at [facility] ordered her bone density test which is DUE NOW.  PLEASE CALL [facility] TO SCHEDULE   BLOOD WORK TODAY  KRISITN HAS EXCEAM IN HER LEFT EAR AND HAS SCRATCHED IT UNTIL IT BLEEDS  PLEASE APPLY MOMETASON OINTMENT FOR THE ECZEMA IN THE LEFT EAR.  APPLY TWICE DAILY

## 2023-02-21 NOTE — Assessment & Plan Note (Signed)
Symptoms have been quiescent.  Her Last colonoscopy was in 2014 and 2 yr follow up was advised.  She was referred to to [facility] gastroenterology In 2019 but was not seen due ot her foster mother's health issues

## 2023-03-29 DIAGNOSIS — Z23 Encounter for immunization: Secondary | ICD-10-CM | POA: Diagnosis not present

## 2023-03-30 ENCOUNTER — Other Ambulatory Visit: Payer: Self-pay | Admitting: Internal Medicine

## 2023-05-02 ENCOUNTER — Other Ambulatory Visit: Payer: Self-pay | Admitting: Family

## 2023-05-17 DIAGNOSIS — S99922A Unspecified injury of left foot, initial encounter: Secondary | ICD-10-CM | POA: Diagnosis not present

## 2023-05-17 DIAGNOSIS — E039 Hypothyroidism, unspecified: Secondary | ICD-10-CM | POA: Diagnosis not present

## 2023-05-17 DIAGNOSIS — Z79899 Other long term (current) drug therapy: Secondary | ICD-10-CM | POA: Diagnosis not present

## 2023-05-17 DIAGNOSIS — Q909 Down syndrome, unspecified: Secondary | ICD-10-CM | POA: Diagnosis not present

## 2023-05-17 DIAGNOSIS — M810 Age-related osteoporosis without current pathological fracture: Secondary | ICD-10-CM | POA: Diagnosis not present

## 2023-05-17 DIAGNOSIS — Z79631 Long term (current) use of antimetabolite agent: Secondary | ICD-10-CM | POA: Diagnosis not present

## 2023-05-17 DIAGNOSIS — J449 Chronic obstructive pulmonary disease, unspecified: Secondary | ICD-10-CM | POA: Diagnosis not present

## 2023-05-17 DIAGNOSIS — S90122A Contusion of left lesser toe(s) without damage to nail, initial encounter: Secondary | ICD-10-CM | POA: Diagnosis not present

## 2023-05-17 DIAGNOSIS — M0600 Rheumatoid arthritis without rheumatoid factor, unspecified site: Secondary | ICD-10-CM | POA: Diagnosis not present

## 2023-05-17 DIAGNOSIS — M1990 Unspecified osteoarthritis, unspecified site: Secondary | ICD-10-CM | POA: Diagnosis not present

## 2023-05-24 ENCOUNTER — Other Ambulatory Visit: Payer: Self-pay

## 2023-05-24 NOTE — Telephone Encounter (Signed)
Refilled: 02/23/2023 Last OV: 02/21/2023 Next OV: 08/22/2023

## 2023-05-25 MED ORDER — METHOTREXATE SODIUM 2.5 MG PO TABS: ORAL_TABLET | ORAL | 10 refills | Status: DC

## 2023-06-03 DIAGNOSIS — M79674 Pain in right toe(s): Secondary | ICD-10-CM | POA: Diagnosis not present

## 2023-06-03 DIAGNOSIS — M2042 Other hammer toe(s) (acquired), left foot: Secondary | ICD-10-CM | POA: Diagnosis not present

## 2023-06-03 DIAGNOSIS — M79675 Pain in left toe(s): Secondary | ICD-10-CM | POA: Diagnosis not present

## 2023-06-03 DIAGNOSIS — M2041 Other hammer toe(s) (acquired), right foot: Secondary | ICD-10-CM | POA: Diagnosis not present

## 2023-06-03 DIAGNOSIS — M79671 Pain in right foot: Secondary | ICD-10-CM | POA: Diagnosis not present

## 2023-06-03 DIAGNOSIS — M25871 Other specified joint disorders, right ankle and foot: Secondary | ICD-10-CM | POA: Diagnosis not present

## 2023-06-03 DIAGNOSIS — B351 Tinea unguium: Secondary | ICD-10-CM | POA: Diagnosis not present

## 2023-06-10 NOTE — Telephone Encounter (Signed)
 Copied from CRM [id number]. Topic: Clinical - Request for Lab/Test Order >> Jun 10, 2023 10:00 AM Tiffany H wrote: Reason for CRM: Cindy Oconnor - Phone: [phone] [employer] called to advise that will fax over a request for prior authorization for wrist orthotics.  Patient requested wrist orthotics. Please assist.

## 2023-06-16 ENCOUNTER — Telehealth: Payer: Self-pay | Admitting: Internal Medicine

## 2023-06-16 NOTE — Telephone Encounter (Unsigned)
Copied from CRM [id number]. Topic: General - Other >> Jun 16, 2023  2:28 PM Theodis Sato wrote: Reason for CRM: Alex from [employer] is sending a fax for a prior authorization that has already been faxed 2 separate times and tomorrow is the last day this can be completed.

## 2023-07-07 ENCOUNTER — Ambulatory Visit (INDEPENDENT_AMBULATORY_CARE_PROVIDER_SITE_OTHER): Payer: Medicare Other | Admitting: Internal Medicine

## 2023-07-07 ENCOUNTER — Encounter: Payer: Self-pay | Admitting: Internal Medicine

## 2023-07-07 VITALS — BP 98/63 | HR 69 | Ht <= 58 in | Wt 140.0 lb

## 2023-07-07 DIAGNOSIS — J4521 Mild intermittent asthma with (acute) exacerbation: Secondary | ICD-10-CM | POA: Diagnosis not present

## 2023-07-07 DIAGNOSIS — M1990 Unspecified osteoarthritis, unspecified site: Secondary | ICD-10-CM

## 2023-07-07 DIAGNOSIS — E861 Hypovolemia: Secondary | ICD-10-CM | POA: Diagnosis not present

## 2023-07-07 DIAGNOSIS — I959 Hypotension, unspecified: Secondary | ICD-10-CM | POA: Insufficient documentation

## 2023-07-07 DIAGNOSIS — M06042 Rheumatoid arthritis without rheumatoid factor, left hand: Secondary | ICD-10-CM | POA: Diagnosis not present

## 2023-07-07 DIAGNOSIS — K5190 Ulcerative colitis, unspecified, without complications: Secondary | ICD-10-CM | POA: Diagnosis not present

## 2023-07-07 DIAGNOSIS — E0849 Diabetes mellitus due to underlying condition with other diabetic neurological complication: Secondary | ICD-10-CM | POA: Insufficient documentation

## 2023-07-07 MED ORDER — PROLIA 60 MG/ML ~~LOC~~ SOSY: 60.0000 mg | PREFILLED_SYRINGE | SUBCUTANEOUS | 2 refills | Status: AC

## 2023-07-07 MED ORDER — ATORVASTATIN CALCIUM 20 MG PO TABS: 20.0000 mg | ORAL_TABLET | Freq: Every day | ORAL | 11 refills | Status: DC

## 2023-07-07 MED ORDER — MONTELUKAST SODIUM 10 MG PO TABS: 10.0000 mg | ORAL_TABLET | Freq: Every day | ORAL | 11 refills | Status: DC

## 2023-07-07 MED ORDER — OMEPRAZOLE 20 MG PO CPDR: DELAYED_RELEASE_CAPSULE | ORAL | 11 refills | Status: DC

## 2023-07-07 MED ORDER — SERTRALINE HCL 25 MG PO TABS: 25.0000 mg | ORAL_TABLET | Freq: Every day | ORAL | 10 refills | Status: DC

## 2023-07-07 MED ORDER — LEVOTHYROXINE SODIUM 112 MCG PO TABS: 112.0000 ug | ORAL_TABLET | Freq: Every day | ORAL | 11 refills | Status: DC

## 2023-07-07 NOTE — Progress Notes (Signed)
 Subjective:  Patient ID: Cindy Oconnor, female    DOB: 1971-03-14  Age: 53 y.o. MRN: [id number]  CC: The primary encounter diagnosis was Rheumatoid arthritis without rheumatoid factor, left hand (HCC). Diagnoses of Morbid obesity (HCC), Diabetes due to undrl condition w oth diabetic neuro comp (HCC), Chronic ulcerative colitis without complication, unspecified location (HCC), Hypotension due to hypovolemia, Mild intermittent chronic asthma with acute exacerbation, and Chronic inflammatory arthritis were also pertinent to this visit.   HPI Cindy Oconnor presents for  Chief Complaint  Patient presents with   Medical Management of Chronic Issues    Follow up to renew FL2 forms    1) osteoporosis : historically  managed by her [facility] rheumatologist with Prolia  injections; however she has not had treatment since 2023  . The treatments are given at her long term care facility and per  [facility] an rx was sent  in on Jan 9  but not received and her appt in Feb was cancelled   2) hypotension : chronic . She has not had any recent illness  3) Asthma:  currently asymptomatic   Outpatient Medications Prior to Visit  Medication Sig Dispense Refill   acetaminophen  (TYLENOL ) 650 MG CR tablet TAKE 1 TABLET BY MOUTH EVERY EIGHT HOURS 21 tablet 10   amoxicillin  (AMOXIL ) 500 MG capsule TAKE 4 CAPSULES (2000MG ) BY MOUTH AT ONCE 1-HR PRIOR TO DENTAL APPT 4 capsule 10   ARTHRITIS PAIN RELIEVER 1 % GEL APPLY 2 G TOPICALLY 4 (FOUR) TIMES DAILY AS NEEDED FOR JOINT PAIN. 150 g 11   aspirin  EC (ASPIRIN  LOW DOSE) 81 MG tablet TAKE 1 TABLET BY MOUTH ONCE DAILY 90 tablet 3   [place]yr Saline Nasal No-Drip GEL INSTILL INTO NOSE TWICE DAILY TO MOISTURIZE LINING OF NOSE;INSTILL INTO NOSE TWICE A DAY AS NEEDED FOR DRY NOSE 22 mL 11   azelastine  (OPTIVAR ) 0.05 % ophthalmic solution PLACE 1 DROP EACH EYE TWICE DAILY  *WAIT 3-5 MINUTES BETWEEN 2 EYE MEDS* *STORE UPRIGHT* 6 mL 11   BLUE GEL 2 % GEL APPLY TOPICALLY TO PAINFUL  JOINTS 2 TO 3 TIMES DAILY AS NEEDED 226.8 g 11   chlorhexidine  (PERIDEX ) 0.12 % solution Use as directed 15 mLs in the mouth or throat 2 (two) times daily.     clotrimazole -betamethasone  (LOTRISONE ) cream APPLY TOPICALLY 2 TIMES PER DAY 45 g 2   cyanocobalamin  (VITAMIN B12) 1000 MCG/ML injection INJECT 1ML=1000MCG INTRAMUSCULARLY EVERY MONTH ON THE 30TH 1 mL 11   DELSYM  30 MG/5ML liquid TAKE 1 TEASPOONFUL BY MOUTH FOUR TIMES A DAY AS NEEDED *SHAKE WELL* 89 mL 4   diazepam  (VALIUM ) 5 MG tablet Take 1 tablet (5 mg total) by mouth every 12 (twelve) hours as needed for anxiety. 10 tablet 0   docusate sodium  (COLACE) 100 MG capsule TAKE 1 CAPSULE BY MOUTH EVERY OTHER DAY *DO NOT CRUSH OR CHEW* 3 capsule 10   fluticasone  (FLONASE) 50 MCG/ACT nasal spray USE 2 SPRAYS EACH NOSTRIL ONCE DAILY *SHAKE GENTLY* 16 g 11   fluticasone -salmeterol (ADVAIR HFA) 230-21 MCG/ACT inhaler Inhale 2 puffs into the lungs 2 (two) times daily. 1 each 12   folic acid  (FOLVITE ) 1 MG tablet TAKE 1 TABLET BY MOUTH EVERY DAY 30 tablet 2   gabapentin  (NEURONTIN ) 300 MG capsule Take 300 mg by mouth 2 (two) times daily.     GUAIATUSSIN AC 100-10 MG/5ML syrup TAKE 5MLS BY MOUTH THREE TIMES A DAY AS NEEDED FOR COUGH 180 mL 4   ipratropium (  ATROVENT ) 0.02 % nebulizer solution INHALE 1 VIAL VIA NEBULIZER FOUR TIMES A DAY AS NEEDED FOR WHEEZING OR COUGH 150 mL 11   ketoconazole  (NIZORAL ) 2 % cream APPLY TOIPICALLY ONCE DAILY 60 g 10   levalbuterol  (XOPENEX ) 0.63 MG/3ML nebulizer solution Take 3 mLs (0.63 mg total) by nebulization every 8 (eight) hours as needed for wheezing or shortness of breath. 180 mL 11   methotrexate  (RHEUMATREX) 2.5 MG tablet TAKE 7 TABLETS (17.5 MG) BY MOUTH ONCE A WEEK ON FRIDAY. CAUTION: CHEMOTHERAPY. PROTECT FROM LIGHT. NOTE DOSE AND FREQUENCY.   HAZARDOUS DRUG: WEAR GLOVES. DO NOT CF 7 tablet 10   mometasone  (ELOCON ) 0.1 % cream APPLY TWO TIMES DAILY TO LEFT OUTER EAR FOR ECZEMA 15 g 1   Multiple  Vitamins-Minerals (THERA-TABS M) TABS TAKE 1 TABLET BY MOUTH ONCE DAILY  FOR SUPPLEMENT *TAKE WITH FOOD* 3 tablet 11   OYSCO 500 + D 500-5 MG-MCG TABS TAKE 1 TABLET  BY MOUTH ONCE DAILY FOR SUPPLEMENT 7 tablet 10   predniSONE  (DELTASONE ) 2.5 MG tablet TAKE 1 TABLET BY MOUTH EACH DAY 30 tablet 11   SENNA-TIME 8.6 MG tablet TAKE 2 TABLETS BY MOUTH AT BEDTIME 14 tablet 10   sodium fluoride (PREVIDENT 5000 PLUS) 1.1 % CREA dental cream Place 1 Application onto teeth every evening.     SYRINGE-NEEDLE, DISP, 3 ML (BD ECLIPSE SYRINGE) 25G X 1 3 ML MISC FOR USE WITH CYANOCOBALAMIN  1 each 11   atorvastatin  (LIPITOR) 20 MG tablet Take 1 tablet (20 mg total) by mouth daily. 30 tablet 11   levothyroxine  (SYNTHROID ) 112 MCG tablet Take 1 tablet (112 mcg total) by mouth daily. 7 tablet 11   montelukast  (SINGULAIR ) 10 MG tablet Take 1 tablet (10 mg total) by mouth at bedtime. 30 tablet 11   omeprazole  (PRILOSEC) 20 MG capsule TAKE 1 CAPSULE BY MOUTH TWICE DAILY *DO NOT CRUSH OR CHEW* 60 capsule 11   PROLIA  60 MG/ML SOSY injection Inject into the skin.     sertraline  (ZOLOFT ) 25 MG tablet TAKE 1 TABLET BY MOUTH ONCE DAILY 7 tablet 10   No facility-administered medications prior to visit.    Review of Systems;  Patient denies headache, fevers, malaise, unintentional weight loss, skin rash, eye pain, sinus congestion and sinus pain, sore throat, dysphagia,  hemoptysis , cough, dyspnea, wheezing, chest pain, palpitations, orthopnea, edema, abdominal pain, nausea, melena, diarrhea, constipation, flank pain, dysuria, hematuria, urinary  Frequency, nocturia, numbness, tingling, seizures,  Focal weakness, Loss of consciousness,  Tremor, insomnia, depression, anxiety, and suicidal ideation.      Objective:  BP 98/63   Pulse 69   Ht 4' 8 (1.422 m)   Wt 140 lb (63.5 kg)   LMP 04/11/1981   SpO2 98%   BMI 31.39 kg/m   BP Readings from Last 3 Encounters:  07/07/23 98/63  02/21/23 112/64  01/18/23 124/76     Wt Readings from Last 3 Encounters:  07/07/23 140 lb (63.5 kg)  02/21/23 132 lb 9.6 oz (60.1 kg)  01/18/23 133 lb 9.6 oz (60.6 kg)    Physical Exam Vitals reviewed.  Constitutional:      General: She is not in acute distress.    Appearance: Normal appearance. She is normal weight. She is not ill-appearing, toxic-appearing or diaphoretic.  HENT:     Head: Normocephalic.  Eyes:     General: No scleral icterus.       Right eye: No discharge.        Left  eye: No discharge.     Conjunctiva/sclera: Conjunctivae normal.  Cardiovascular:     Rate and Rhythm: Normal rate and regular rhythm.     Heart sounds: Normal heart sounds.  Pulmonary:     Effort: Pulmonary effort is normal. No respiratory distress.     Breath sounds: Normal breath sounds.  Musculoskeletal:        General: Normal range of motion.  Skin:    General: Skin is warm and dry.  Neurological:     General: No focal deficit present.     Mental Status: She is alert and oriented to person, place, and time. Mental status is at baseline.  Psychiatric:        Mood and Affect: Mood normal.        Behavior: Behavior normal.        Thought Content: Thought content normal.        Judgment: Judgment normal.    Lab Results  Component Value Date   HGBA1C 5.6 02/21/2023   HGBA1C 5.4 07/06/2022   HGBA1C 5.7 12/17/2021    Lab Results  Component Value Date   CREATININE 0.92 02/21/2023   CREATININE 0.83 07/06/2022   CREATININE 0.84 12/17/2021    Lab Results  Component Value Date   WBC 7.1 12/17/2021   HGB 12.6 12/17/2021   HCT 38.3 12/17/2021   PLT 296.0 12/17/2021   GLUCOSE 98 02/21/2023   CHOL 121 02/21/2023   TRIG 100.0 02/21/2023   HDL 54.10 02/21/2023   LDLDIRECT 55.0 02/21/2023   LDLCALC 47 02/21/2023   ALT 19 02/21/2023   AST 31 02/21/2023   NA 141 02/21/2023   K 4.1 02/21/2023   CL 101 02/21/2023   CREATININE 0.92 02/21/2023   BUN 16 02/21/2023   CO2 31 02/21/2023   TSH 4.81 02/21/2023   INR  0.95 07/28/2017   HGBA1C 5.6 02/21/2023    MM 3D SCREENING MAMMOGRAM BILATERAL BREAST Result Date: 01/19/2023 CLINICAL DATA:  Screening. EXAM: DIGITAL SCREENING BILATERAL MAMMOGRAM WITH TOMOSYNTHESIS AND CAD TECHNIQUE: Bilateral screening digital craniocaudal and mediolateral oblique mammograms were obtained. Bilateral screening digital breast tomosynthesis was performed. The images were evaluated with computer-aided detection. COMPARISON:  Previous exam(s). ACR Breast Density Category b: There are scattered areas of fibroglandular density. FINDINGS: There are no findings suspicious for malignancy. IMPRESSION: No mammographic evidence of malignancy. A result letter of this screening mammogram will be mailed directly to the patient. RECOMMENDATION: Screening mammogram in one year. (Code:SM-B-01Y) BI-RADS CATEGORY  1: Negative. Electronically Signed   By: Craig Farr M.D.   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  No recent GI illness,  fevers  or medications that could cause low BP.  Advised to substitute Gatorade for water for the next few days to increased her sodiun   Mild intermittent chronic asthma with acute exacerbation Assessment & Plan: Changing maintenance inhaler from Symbicort  60/4.5 to advair HFA 230 for insurance purposes . She has had no recent exacerbations   Chronic inflammatory arthritis Assessment & Plan: Her diagnosis has changed several times , upon review of various rheumatologists and orthopedist notes from 2018 forward.  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 She is taking MTX  and folic acid  , prescribed by Rheum and surveillance  labs have been done    Other orders -     Atorvastatin  Calcium ; Take 1 tablet (20 mg total) by mouth daily.  Dispense: 30 tablet; Refill: 11 -     Levothyroxine  Sodium; Take 1 tablet (112 mcg total) by mouth daily.  Dispense: 7 tablet; Refill: 11 -     Montelukast  Sodium; Take 1 tablet (10 mg total) by mouth at bedtime.  Dispense: 30 tablet; Refill: 11 -     Omeprazole ; TAKE 1 CAPSULE BY MOUTH TWICE DAILY *DO NOT CRUSH OR CHEW*  Dispense: 60 capsule; Refill: 11 -     Sertraline  HCl; Take 1 tablet (25 mg total) by mouth daily.  Dispense: 7 tablet; Refill: 10 -     Prolia ; Inject 60 mg into the skin every 6 (six) months.  Dispense: 180 mL; Refill: 2     Follow-up: No follow-ups on file.   Cindy LITTIE Kettering, MD

## 2023-07-07 NOTE — Assessment & Plan Note (Signed)
 Mild, etiology unclear.  No recent GI illness,  fevers  or medications that could cause low BP.  Advised to substitute Gatorade for water for the next few days to increased her sodiun

## 2023-07-07 NOTE — Assessment & Plan Note (Signed)
 Patient has not had Prolia  injection since 2023 for reasons that are unclear.  The facility administers and dr Rachell Budge [facility] Rheum) provides the electronic rx to [facility].  I have sent an rx to pharmacy today t avoid any further delay.

## 2023-07-09 NOTE — Assessment & Plan Note (Signed)
Changing maintenance inhaler from Symbicort 60/4.5 to advair HFA 230 for insurance purposes . She has had no recent exacerbations

## 2023-07-09 NOTE — Assessment & Plan Note (Signed)
Her diagnosis has changed several times , upon review of various rheumatologists and orthopedist notes from 2018 forward.  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 She is taking MTX and folic acid , prescribed by Rheum and surveillance labs have been done

## 2023-07-11 DIAGNOSIS — H5213 Myopia, bilateral: Secondary | ICD-10-CM | POA: Diagnosis not present

## 2023-07-11 DIAGNOSIS — Q909 Down syndrome, unspecified: Secondary | ICD-10-CM | POA: Diagnosis not present

## 2023-07-18 ENCOUNTER — Ambulatory Visit
Admission: EM | Admit: 2023-07-18 | Discharge: 2023-07-18 | Disposition: A | Discharge status: Home / Self Care | Payer: Medicare Other | Attending: Family Medicine | Admitting: Family Medicine

## 2023-07-18 DIAGNOSIS — J101 Influenza due to other identified influenza virus with other respiratory manifestations: Secondary | ICD-10-CM | POA: Insufficient documentation

## 2023-07-18 DIAGNOSIS — J4541 Moderate persistent asthma with (acute) exacerbation: Secondary | ICD-10-CM | POA: Diagnosis not present

## 2023-07-18 LAB — RESP PANEL BY RT-PCR (RSV, FLU A&B, COVID)  RVPGX2
Influenza A by PCR: POSITIVE — AB
Influenza B by PCR: NEGATIVE
Resp Syncytial Virus by PCR: NEGATIVE
SARS Coronavirus 2 by RT PCR: NEGATIVE

## 2023-07-18 MED ORDER — PREDNISONE 20 MG PO TABS: 40.0000 mg | ORAL_TABLET | Freq: Every day | ORAL | 0 refills | Status: AC

## 2023-07-18 NOTE — Discharge Instructions (Addendum)
 You have influenza A.  Tamiflu was not prescribed.  Your symptoms will gradually improve over the next 7 to 10 days.  The cough may last about 3 weeks.   Take ibuprofen 600 mg and/or Tylenol 1000 mg for fever, headache or body aches.   For cough: Pick up your prednisone. You can also use guaifenesin and dextromethorphan for cough. You can use a humidifier for chest congestion and cough.  If you don't have a humidifier, you can sit in the bathroom with the hot shower running.      For sore throat: try warm salt water gargles, Mucinex sore throat cough drops or cepacol lozenges, throat spray, warm tea or water with lemon/honey, popsicles or ice, or OTC cold relief medicine for throat discomfort. You can also purchase chloraseptic spray at the pharmacy or dollar store.   For congestion: take a daily anti-histamine like Zyrtec, Claritin, and a oral decongestant, such as pseudoephedrine.  You can also use Flonase 1-2 sprays in each nostril daily. Afrin is also a good option, if you do not have high blood pressure.    It is important to stay hydrated: drink plenty of fluids (water, gatorade/powerade/pedialyte, juices, or teas) to keep your throat moisturized and help further relieve irritation/discomfort.    Return or go to the Emergency Department if symptoms worsen or do not improve in the next few days

## 2023-07-18 NOTE — ED Provider Notes (Signed)
 [facility]    CSN: [id number] Arrival date & time: 07/18/23  0930      History   Chief Complaint Chief Complaint  Patient presents with   Cough   Emesis   Abdominal Pain    HPI Cindy Oconnor is a 53 y.o. female.   HPI  History obtained from the patient and her guardian.  Cindy Oconnor presents for cough, vomiting, nasal congestion, abdominal pain, rhinorrhea that started 3 days ago.  No fever, diarrhea. They are giving her cough medication. She lives at a group home.    Has asthma and uses an inhaler.  No history of smoking.     Past Medical History:  Diagnosis Date   Arthritis    Asthma    unspecified   Blood clot in vein    Chicken pox    Colitis    Collagen vascular disease (HCC)    COPD (chronic obstructive pulmonary disease) (HCC)    COVID-19 virus detected 04/30/2019   Nov 23 tested positive,  LRI symptoms    Deafness in left ear    Disease of thyroid gland    Down syndrome    DVT (deep venous thrombosis) (HCC)    Fractures    GERD (gastroesophageal reflux disease)    Hypothyroidism    unspecified   Inflammatory arthritis 01/30/2014   a. Positive anti-CCP antibodies, negative rheumatoid factor, positive FANA. b. Methotrexate, Prednisone, Plaquenil.    Joint pain    Lupus 1995   Osteoarthritis 01/30/2014   a. Lumbar disc disease. b. Trigger nodules   Osteoporosis 01/30/2014   a. 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   Hypotension 07/07/2023   Generalized anxiety disorder 12/19/2021   History of CVA (cerebrovascular accident) without residual deficits 10/23/2021   Benign head tremor 10/23/2021   Balance  problem 10/23/2021   Leukocytosis 06/10/2021   Abdominal aortic atherosclerosis (HCC) 05/27/2020   Hepatic steatosis 05/27/2020   Educated about COVID-19 virus infection 10/19/2018   Allergic conjunctivitis, bilateral 11/13/2017   Long-term use of high-risk medication 10/09/2017   S/P total knee arthroplasty, right 08/10/2017   B12 deficiency 06/22/2017   Hypocalcemia 03/15/2017   Status post total left knee replacement 01/10/2017   Macrocytic anemia with vitamin B12 deficiency 01/08/2017   Ulcerative colitis, chronic (HCC) 09/26/2015   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   Mild tricuspid regurgitation 02/13/2013   Routine general medical examination at a health care facility 01/24/2013   Overweight (BMI 25.0-29.9) 01/21/2012   Chronic inflammatory arthritis 06/11/2006   S/P TAH (total abdominal hysterectomy)  and partial vaginectomy 06/11/2006   Hypothyroid 03/18/2006   Asthma, chronic 03/18/2006   GERD 03/18/2006   Down's syndrome 03/18/2006    Past Surgical History:  Procedure Laterality Date   ABDOMINAL HYSTERECTOMY     ABDOMINAL HYSTERECTOMY W/ PARTIAL VAGINACTOMY  1992   BUNIONECTOMY Bilateral    Bunionectomy great toe   COLONOSCOPY  04/16/2013   Hx of ulcerative colitis - repeat 2 years per Dr. Shelle Iron   COLONOSCOPY  09/24/2003   Dr. Ruffin Frederick   FOOT SURGERY Bilateral    x2   HALLUX VALGUS CORRECTION  KNEE ARTHROPLASTY Left 01/10/2017   Procedure: COMPUTER ASSISTED TOTAL KNEE ARTHROPLASTY;  Surgeon: Donato Heinz, MD;  Location: [facility] ORS;  Service: Orthopedics;  Laterality: Left;   KNEE ARTHROPLASTY Right 08/10/2017   Procedure: COMPUTER ASSISTED TOTAL KNEE ARTHROPLASTY;  Surgeon: Donato Heinz, MD;  Location: [facility] ORS;  Service: Orthopedics;  Laterality: Right;    OB History   No obstetric history on file.      Home Medications    Prior to Admission medications   Medication Sig Start Date End Date Taking? Authorizing Provider  ARTHRITIS PAIN  RELIEVER 1 % GEL APPLY 2 G TOPICALLY 4 (FOUR) TIMES DAILY AS NEEDED FOR JOINT PAIN. 09/09/21  Yes Sherlene Shams, MD  aspirin EC (ASPIRIN LOW DOSE) 81 MG tablet TAKE 1 TABLET BY MOUTH ONCE DAILY 01/11/23  Yes Sherlene Shams, MD  atorvastatin (LIPITOR) 20 MG tablet Take 1 tablet (20 mg total) by mouth daily. 07/07/23  Yes Sherlene Shams, MD  diazepam (VALIUM) 5 MG tablet Take 1 tablet (5 mg total) by mouth every 12 (twelve) hours as needed for anxiety. 02/17/22  Yes Sherlene Shams, MD  levothyroxine (SYNTHROID) 112 MCG tablet Take 1 tablet (112 mcg total) by mouth daily. 07/07/23  Yes Sherlene Shams, MD  montelukast (SINGULAIR) 10 MG tablet Take 1 tablet (10 mg total) by mouth at bedtime. 07/07/23  Yes Sherlene Shams, MD  OYSCO 500 + D 500-5 MG-MCG TABS TAKE 1 TABLET  BY MOUTH ONCE DAILY FOR SUPPLEMENT 09/07/22  Yes Sherlene Shams, MD  predniSONE (DELTASONE) 20 MG tablet Take 2 tablets (40 mg total) by mouth daily for 5 days. 07/18/23 07/23/23 Yes Ginia Rudell, DO  PROLIA 60 MG/ML SOSY injection Inject 60 mg into the skin every 6 (six) months. 07/07/23  Yes Sherlene Shams, MD  SENNA-TIME 8.6 MG tablet TAKE 2 TABLETS BY MOUTH AT BEDTIME 09/07/22  Yes Sherlene Shams, MD  sertraline (ZOLOFT) 25 MG tablet Take 1 tablet (25 mg total) by mouth daily. 07/07/23  Yes Sherlene Shams, MD  acetaminophen (TYLENOL) 650 MG CR tablet TAKE 1 TABLET BY MOUTH EVERY EIGHT HOURS 07/27/22   Sherlene Shams, MD  amoxicillin (AMOXIL) 500 MG capsule TAKE 4 CAPSULES (2000MG ) BY MOUTH AT ONCE 1-HR PRIOR TO DENTAL APPT 04/03/23   Sherlene Shams, MD  Ayr Saline Nasal No-Drip GEL INSTILL INTO NOSE TWICE DAILY TO MOISTURIZE LINING OF NOSE;INSTILL INTO NOSE TWICE A DAY AS NEEDED FOR DRY NOSE 12/14/22   Sherlene Shams, MD  azelastine (OPTIVAR) 0.05 % ophthalmic solution PLACE 1 DROP EACH EYE TWICE DAILY  *WAIT 3-5 MINUTES BETWEEN 2 EYE MEDS* *STORE UPRIGHT* 02/21/23   Sherlene Shams, MD  BLUE GEL 2 % GEL APPLY TOPICALLY TO PAINFUL JOINTS 2  TO 3 TIMES DAILY AS NEEDED 09/09/21   Sherlene Shams, MD  chlorhexidine (PERIDEX) 0.12 % solution Use as directed 15 mLs in the mouth or throat 2 (two) times daily.    [provider]  clotrimazole-betamethasone (LOTRISONE) cream APPLY TOPICALLY 2 TIMES PER DAY 01/19/18   Sherlene Shams, MD  cyanocobalamin (VITAMIN B12) 1000 MCG/ML injection INJECT 1ML=1000MCG INTRAMUSCULARLY EVERY MONTH ON THE 30TH 02/21/23   Sherlene Shams, MD  DELSYM 30 MG/5ML liquid TAKE 1 TEASPOONFUL BY MOUTH FOUR TIMES A DAY AS NEEDED *SHAKE WELL* 09/11/21   Sherlene Shams, MD  docusate sodium (COLACE) 100 MG capsule TAKE 1 CAPSULE BY MOUTH EVERY OTHER DAY *DO NOT  CRUSH OR CHEW* 09/07/22   Sherlene Shams, MD  fluticasone [facility]) 50 MCG/ACT nasal spray USE 2 SPRAYS EACH NOSTRIL ONCE DAILY *SHAKE GENTLY* 09/21/22   Sherlene Shams, MD  fluticasone-salmeterol (ADVAIR HFA) 230-21 MCG/ACT inhaler Inhale 2 puffs into the lungs 2 (two) times daily. 08/17/22   Sherlene Shams, MD  folic acid (FOLVITE) 1 MG tablet TAKE 1 TABLET BY MOUTH EVERY DAY 12/21/18   Sherlene Shams, MD  gabapentin (NEURONTIN) 300 MG capsule Take 300 mg by mouth 2 (two) times daily. 06/30/22   [provider]  GUAIATUSSIN AC 100-10 MG/5ML syrup TAKE BY MOUTH THREE TIMES A DAY AS NEEDED FOR COUGH 09/10/21   Sherlene Shams, MD  ipratropium (ATROVENT) 0.02 % nebulizer solution INHALE 1 VIAL VIA NEBULIZER FOUR TIMES A DAY AS NEEDED FOR WHEEZING OR COUGH 08/17/22   Sherlene Shams, MD  ketoconazole (NIZORAL) 2 % cream APPLY TOIPICALLY ONCE DAILY 08/17/21   Worthy Rancher B, FNP  levalbuterol (XOPENEX) 0.63 MG/3ML nebulizer solution Take 3 mLs (0.63 mg total) by nebulization every 8 (eight) hours as needed for wheezing or shortness of breath. 08/17/22   Sherlene Shams, MD  methotrexate (RHEUMATREX) 2.5 MG tablet TAKE 7 TABLETS (17.5 MG) BY MOUTH ONCE A WEEK ON FRIDAY. CAUTION: CHEMOTHERAPY. PROTECT FROM LIGHT. NOTE DOSE AND FREQUENCY.   HAZARDOUS DRUG:  WEAR GLOVES. DO NOT CF 05/25/23   Sherlene Shams, MD  mometasone (ELOCON) 0.1 % cream APPLY TWO TIMES DAILY TO LEFT OUTER EAR FOR ECZEMA 02/21/23   Sherlene Shams, MD  Multiple Vitamins-Minerals (THERA-TABS M) TABS TAKE 1 TABLET BY MOUTH ONCE DAILY  FOR SUPPLEMENT *TAKE WITH FOOD* 01/15/23   Sherlene Shams, MD  omeprazole (PRILOSEC) 20 MG capsule TAKE 1 CAPSULE BY MOUTH TWICE DAILY *DO NOT CRUSH OR CHEW* 07/07/23   Sherlene Shams, MD  predniSONE (DELTASONE) 2.5 MG tablet TAKE 1 TABLET BY MOUTH EACH DAY 12/21/18   Sherlene Shams, MD  sodium fluoride (PREVIDENT 5000 PLUS) 1.1 % CREA dental cream Place 1 Application onto teeth every evening.    [provider]  SYRINGE-NEEDLE, DISP, 3 ML (BD ECLIPSE SYRINGE) 25G X 1" 3 ML MISC FOR USE WITH CYANOCOBALAMIN 02/21/23   Sherlene Shams, MD    Family History Family History  Problem Relation Age of Onset   Heart disease Mother    Diabetes Mother    Breast cancer Mother 20   Heart disease Father    Diabetes Father    Alcohol abuse Maternal Grandmother    Arthritis Maternal Grandmother    Stroke Maternal Grandmother    Diabetes Maternal Grandmother    Alcohol abuse Maternal Grandfather    Arthritis Maternal Grandfather    Stroke Maternal Grandfather    Diabetes Maternal Grandfather     Social History Social History   Tobacco Use   Smoking status: Never   Smokeless tobacco: Never  Vaping Use   Vaping status: Never Used  Substance Use Topics   Alcohol use: No   Drug use: No     Allergies   Erythromycin, Oxycontin [oxycodone], Tramadol, and Albuterol   Review of Systems Review of Systems: negative unless otherwise stated in HPI.      Physical Exam Triage Vital Signs ED Triage Vitals [07/18/23 1016]  Encounter Vitals Group     BP (!) 105/52     Systolic BP Percentile      Diastolic BP Percentile      Pulse  Rate 75     Resp 19     Temp 98.3 F (36.8 C)     Temp Source Oral     SpO2 95 %     Weight      Height       Head Circumference      Peak Flow      Pain Score      Pain Loc      Pain Education      Exclude from Growth Chart    No data found.  Updated Vital Signs BP (!) 105/52 (BP Location: Left Arm)   Pulse 75   Temp 98.3 F (36.8 C) (Oral)   Resp 19   LMP 04/11/1981   SpO2 95%   Visual Acuity Right Eye Distance:   Left Eye Distance:   Bilateral Distance:    Right Eye Near:   Left Eye Near:    Bilateral Near:     Physical Exam GEN:     alert, non-toxic appearing female in no distress    HENT:  mucus membranes moist, oropharyngeal without lesions or erythema, no tonsillar hypertrophy or exudates, clear nasal discharge EYES:   no scleral injection or discharge RESP:  no increased work of breathing, coarse breath sounds bilaterally with faint expiratory wheezing  CVS:   regular rate and rhythm Skin:   warm and dry    UC Treatments / Results  Labs (all labs ordered are listed, but only abnormal results are displayed) Labs Reviewed  RESP PANEL BY RT-PCR (RSV, FLU A&B, COVID)  RVPGX2 - Abnormal; Notable for the following components:      Result Value   Influenza A by PCR POSITIVE (*)    All other components within normal limits    EKG   Radiology No results found.  Procedures Procedures (including critical care time)  Medications Ordered in UC Medications - No data to display  Initial Impression / Assessment and Plan / UC Course  I have reviewed the triage vital signs and the nursing notes.  Pertinent labs & imaging results that were available during my care of the patient were reviewed by me and considered in my medical decision making (see chart for details).       Pt is a 53 y.o. female who has asthma presents for 4 days of respiratory symptoms. Rhyen is afebrile here without recent antipyretics. Satting well on room air. Overall pt is non-toxic appearing, well hydrated, without respiratory distress. Pulmonary exam is remarkable for coarse breath  sounds bilaterally with faint expiratory wheezing .  COVID, RSV and influenza panel obtained and was negative influenza A positive. She is outside the window for Tamiflu. Treat asthma exacerbation with prednisone burst. Hold daily prednisone 2.5 mg while taking prednisone burst. Discussed symptomatic treatment.  Explained lack of efficacy of antibiotics in viral disease.  Typical duration of symptoms discussed.   Return and ED precautions given and voiced understanding. Discussed MDM, treatment plan and plan for follow-up with patient and her caregiver who agree with plan.     Final Clinical Impressions(s) / UC Diagnoses   Final diagnoses:  Influenza A with respiratory manifestations  Moderate persistent asthma with acute exacerbation     Discharge Instructions      You have influenza A.  Tamiflu was not prescribed.  Your symptoms will gradually improve over the next 7 to 10 days.  The cough may last about 3 weeks.   Take ibuprofen 600 mg and/or Tylenol 1000 mg for fever,  headache or body aches.   For cough: Pick up your prednisone. You can also use guaifenesin and dextromethorphan for cough. You can use a humidifier for chest congestion and cough.  If you don't have a humidifier, you can sit in the bathroom with the hot shower running.      For sore throat: try warm salt water gargles, Mucinex sore throat cough drops or cepacol lozenges, throat spray, warm tea or water with lemon/honey, popsicles or ice, or OTC cold relief medicine for throat discomfort. You can also purchase chloraseptic spray at the pharmacy or dollar store.   For congestion: take a daily anti-histamine like Zyrtec, Claritin, and a oral decongestant, such as pseudoephedrine.  You can also use Flonase 1-2 sprays in each nostril daily. Afrin is also a good option, if you do not have high blood pressure.    It is important to stay hydrated: drink plenty of fluids (water, gatorade/powerade/pedialyte, juices, or teas) to  keep your throat moisturized and help further relieve irritation/discomfort.    Return or go to the Emergency Department if symptoms worsen or do not improve in the next few days      ED Prescriptions     Medication Sig Dispense Auth. Provider   predniSONE (DELTASONE) 20 MG tablet Take 2 tablets (40 mg total) by mouth daily for 5 days. 10 tablet Katha Cabal, DO      PDMP not reviewed this encounter.   Katha Cabal, DO 07/18/23 1137

## 2023-07-18 NOTE — ED Triage Notes (Addendum)
 Sx x 4 days  Cough Emesis Runny nose Abdominal pain Headache dizziness

## 2023-07-26 ENCOUNTER — Other Ambulatory Visit: Payer: Self-pay | Admitting: Internal Medicine

## 2023-07-26 DIAGNOSIS — R519 Headache, unspecified: Secondary | ICD-10-CM

## 2023-08-18 DIAGNOSIS — B351 Tinea unguium: Secondary | ICD-10-CM | POA: Diagnosis not present

## 2023-08-18 DIAGNOSIS — L97521 Non-pressure chronic ulcer of other part of left foot limited to breakdown of skin: Secondary | ICD-10-CM | POA: Diagnosis not present

## 2023-08-18 DIAGNOSIS — L97511 Non-pressure chronic ulcer of other part of right foot limited to breakdown of skin: Secondary | ICD-10-CM | POA: Diagnosis not present

## 2023-08-18 DIAGNOSIS — M79674 Pain in right toe(s): Secondary | ICD-10-CM | POA: Diagnosis not present

## 2023-08-18 DIAGNOSIS — M79675 Pain in left toe(s): Secondary | ICD-10-CM | POA: Diagnosis not present

## 2023-08-22 ENCOUNTER — Ambulatory Visit (INDEPENDENT_AMBULATORY_CARE_PROVIDER_SITE_OTHER): Payer: Medicare Other | Admitting: Internal Medicine

## 2023-08-22 ENCOUNTER — Encounter: Payer: Self-pay | Admitting: Internal Medicine

## 2023-08-22 VITALS — BP 102/60 | HR 70 | Ht <= 58 in | Wt 135.4 lb

## 2023-08-22 DIAGNOSIS — Z23 Encounter for immunization: Secondary | ICD-10-CM

## 2023-08-22 DIAGNOSIS — M818 Other osteoporosis without current pathological fracture: Secondary | ICD-10-CM

## 2023-08-22 DIAGNOSIS — Z1211 Encounter for screening for malignant neoplasm of colon: Secondary | ICD-10-CM

## 2023-08-22 DIAGNOSIS — E0849 Diabetes mellitus due to underlying condition with other diabetic neurological complication: Secondary | ICD-10-CM | POA: Diagnosis not present

## 2023-08-22 DIAGNOSIS — E034 Atrophy of thyroid (acquired): Secondary | ICD-10-CM | POA: Diagnosis not present

## 2023-08-22 DIAGNOSIS — Z0000 Encounter for general adult medical examination without abnormal findings: Secondary | ICD-10-CM

## 2023-08-22 DIAGNOSIS — T50905A Adverse effect of unspecified drugs, medicaments and biological substances, initial encounter: Secondary | ICD-10-CM | POA: Diagnosis not present

## 2023-08-22 DIAGNOSIS — E785 Hyperlipidemia, unspecified: Secondary | ICD-10-CM | POA: Diagnosis not present

## 2023-08-22 DIAGNOSIS — M0600 Rheumatoid arthritis without rheumatoid factor, unspecified site: Secondary | ICD-10-CM | POA: Diagnosis not present

## 2023-08-22 LAB — HEMOGLOBIN A1C: Hgb A1c MFr Bld: 5.7 % (ref 4.6–6.5)

## 2023-08-22 LAB — TSH: TSH: 14.68 u[IU]/mL — ABNORMAL HIGH (ref 0.35–5.50)

## 2023-08-22 LAB — COMPREHENSIVE METABOLIC PANEL
ALT: 19 U/L (ref 0–35)
AST: 33 U/L (ref 0–37)
Albumin: 3.7 g/dL (ref 3.5–5.2)
Alkaline Phosphatase: 55 U/L (ref 39–117)
BUN: 16 mg/dL (ref 6–23)
CO2: 33 meq/L — ABNORMAL HIGH (ref 19–32)
Calcium: 9.1 mg/dL (ref 8.4–10.5)
Chloride: 104 meq/L (ref 96–112)
Creatinine, Ser: 0.85 mg/dL (ref 0.40–1.20)
GFR: 77.35 mL/min (ref >60.00)
Glucose, Bld: 91 mg/dL (ref 70–99)
Potassium: 3.8 meq/L (ref 3.5–5.1)
Sodium: 143 meq/L (ref 135–145)
Total Bilirubin: 0.3 mg/dL (ref 0.2–1.2)
Total Protein: 6.2 g/dL (ref 6.0–8.3)

## 2023-08-22 LAB — LIPID PANEL
Cholesterol: 121 mg/dL (ref 0–200)
HDL: 46.6 mg/dL (ref >39.00)
LDL Cholesterol: 52 mg/dL (ref 0–99)
NonHDL: 73.93
Total CHOL/HDL Ratio: 3
Triglycerides: 111 mg/dL (ref 0.0–149.0)
VLDL: 22.2 mg/dL (ref 0.0–40.0)

## 2023-08-22 LAB — MICROALBUMIN / CREATININE URINE RATIO
Creatinine,U: 54 mg/dL
Microalb Creat Ratio: UNDETERMINED mg/g (ref 0.0–30.0)
Microalb, Ur: <0.7 mg/dL

## 2023-08-22 LAB — VITAMIN D 25 HYDROXY (VIT D DEFICIENCY, FRACTURES): VITD: 31.07 ng/mL (ref 30.00–100.00)

## 2023-08-22 LAB — C-REACTIVE PROTEIN: CRP: <1 mg/dL (ref 0.5–20.0)

## 2023-08-22 LAB — LDL CHOLESTEROL, DIRECT: Direct LDL: 55 mg/dL

## 2023-08-22 LAB — SEDIMENTATION RATE: Sed Rate: 29 mm/h (ref 0–30)

## 2023-08-22 NOTE — Patient Instructions (Addendum)
 Baxter Hire is overdue for the following tests/vaccines:  1) Bone Density:  I have re ordered this today to be done at [facility]  2) colonoscoyp We will contact Rushie Goltz, Kristen's guardina , to get permission to order the REFERRAL FOR COLONOSCOPY .  THIS IS NECESSARY BECAUSE OF KRISTEN'S HISTORY OF ULCERATIVE COLITIS .  She has been overdue for over 5 years.    3) Pneumonia vaccine:  given today  4) TdaP (tetanus ) : Medicare only pays if you get it done at your pharmacy  5) for her joint pain:    Increase the acetaminophen  to 1000 mg every 8 hours (schedules) for her pain )

## 2023-08-22 NOTE — Progress Notes (Unsigned)
 Subjective:  Patient ID: Cindy Oconnor, female    DOB: July 13, 1970  Age: 53 y.o. MRN: [id number]  CC: The primary encounter diagnosis was Hypothyroidism due to acquired atrophy of thyroid. 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   HPI Cindy Oconnor presents for  Chief Complaint  Patient presents with  . 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 acetaminophen (TYLENOL) 650 MG CR tablet TAKE 1 TABLET BY MOUTH EVERY EIGHT HOURS 12 tablet 10  . amoxicillin (AMOXIL) 500 MG capsule TAKE 4 CAPSULES (2000MG ) BY MOUTH AT ONCE 1-HR PRIOR TO DENTAL APPT 4 capsule 10  . ARTHRITIS PAIN RELIEVER 1 % GEL APPLY 2 G TOPICALLY 4 (FOUR) TIMES DAILY AS NEEDED FOR JOINT PAIN. 150 g 11  . aspirin EC (ASPIRIN LOW DOSE) 81 MG tablet TAKE 1 TABLET BY MOUTH ONCE DAILY 90 tablet 3  . atorvastatin (LIPITOR) 20 MG tablet Take 1 tablet (20 mg total) by mouth daily. 30 tablet 11  . Ayr Saline Nasal No-Drip GEL INSTILL INTO NOSE TWICE DAILY TO MOISTURIZE LINING OF NOSE;INSTILL INTO NOSE TWICE A DAY AS NEEDED FOR DRY NOSE 22 mL 11  . azelastine (OPTIVAR) 0.05 % ophthalmic solution PLACE 1 DROP EACH EYE TWICE DAILY  *WAIT 3-5 MINUTES BETWEEN 2 EYE MEDS* *STORE UPRIGHT* 6 mL 11  . BLUE GEL 2 % GEL APPLY TOPICALLY TO PAINFUL JOINTS 2 TO 3 TIMES DAILY AS NEEDED 226.8 g 11  . chlorhexidine (PERIDEX) 0.12 % solution Use  as directed 15 mLs in the mouth or throat 2 (two) times daily.    . clotrimazole-betamethasone (LOTRISONE) cream APPLY TOPICALLY 2 TIMES PER DAY 45 g 2  . cyanocobalamin (VITAMIN B12) 1000 MCG/ML injection INJECT 1ML=1000MCG INTRAMUSCULARLY EVERY MONTH ON THE 30TH 1 mL 11  . DELSYM 30 MG/5ML liquid TAKE 1 TEASPOONFUL BY MOUTH FOUR TIMES A DAY AS NEEDED *SHAKE WELL* 89 mL 4  . diazepam (VALIUM) 5 MG tablet Take 1 tablet (5 mg total) by mouth every 12 (twelve) hours as needed for anxiety. 10 tablet 0  . docusate sodium (COLACE) 100 MG capsule TAKE 1 CAPSULE BY MOUTH EVERY OTHER DAY *DO NOT CRUSH OR CHEW* 3 capsule 10  . fluticasone (FLONASE) 50 MCG/ACT nasal spray USE 2 SPRAYS EACH NOSTRIL ONCE DAILY *SHAKE GENTLY* 16 g 11  . fluticasone-salmeterol (ADVAIR HFA) 230-21 MCG/ACT inhaler Inhale 2 puffs into the lungs 2 (two) times daily. 1 each 12  . folic acid (FOLVITE) 1 MG tablet TAKE 1 TABLET BY MOUTH EVERY DAY 30 tablet 2  . gabapentin (NEURONTIN) 300 MG capsule Take 300 mg by mouth 2 (two) times daily.    Wilber Oliphant AC 100-10 MG/5ML syrup TAKE BY MOUTH THREE TIMES A DAY AS NEEDED FOR COUGH 180 mL 4  . ipratropium (ATROVENT) 0.02 % nebulizer  solution INHALE 1 VIAL VIA NEBULIZER FOUR TIMES A DAY AS NEEDED FOR WHEEZING OR COUGH 150 mL 11  . ketoconazole (NIZORAL) 2 % cream APPLY TOIPICALLY ONCE DAILY 60 g 10  . levalbuterol (XOPENEX) 0.63 MG/3ML nebulizer solution Take 3 mLs (0.63 mg total) by nebulization every 8 (eight) hours as needed for wheezing or shortness of breath. 180 mL 11  . levothyroxine (SYNTHROID) 112 MCG tablet Take 1 tablet (112 mcg total) by mouth daily. 7 tablet 11  . methotrexate (RHEUMATREX) 2.5 MG tablet TAKE 7 TABLETS (17.5 MG) BY MOUTH ONCE A WEEK ON FRIDAY. CAUTION: CHEMOTHERAPY. PROTECT FROM LIGHT. NOTE DOSE AND FREQUENCY.   HAZARDOUS DRUG: WEAR GLOVES. DO NOT CF 7 tablet 10  . mometasone (ELOCON) 0.1 % cream APPLY TWO TIMES DAILY TO LEFT OUTER EAR FOR ECZEMA 15 g 1  .  montelukast (SINGULAIR) 10 MG tablet Take 1 tablet (10 mg total) by mouth at bedtime. 30 tablet 11  . Multiple Vitamins-Minerals (THERA-TABS M) TABS TAKE 1 TABLET BY MOUTH ONCE DAILY  FOR SUPPLEMENT *TAKE WITH FOOD* 3 tablet 11  . omeprazole (PRILOSEC) 20 MG capsule TAKE 1 CAPSULE BY MOUTH TWICE DAILY *DO NOT CRUSH OR CHEW* 60 capsule 11  . OYSCO 500 + D 500-5 MG-MCG TABS TAKE 1 TABLET  BY MOUTH ONCE DAILY FOR SUPPLEMENT 7 tablet 10  . predniSONE (DELTASONE) 2.5 MG tablet TAKE 1 TABLET BY MOUTH EACH DAY 30 tablet 11  . PROLIA 60 MG/ML SOSY injection Inject 60 mg into the skin every 6 (six) months. 180 mL 2  . SENNA-TIME 8.6 MG tablet TAKE 2 TABLETS BY MOUTH AT BEDTIME 14 tablet 10  . sertraline (ZOLOFT) 25 MG tablet Take 1 tablet (25 mg total) by mouth daily. 7 tablet 10  . sodium fluoride (PREVIDENT 5000 PLUS) 1.1 % CREA dental cream Place 1 Application onto teeth every evening.    Marland Kitchen SYRINGE-NEEDLE, DISP, 3 ML (BD ECLIPSE SYRINGE) 25G X 1" 3 ML MISC FOR USE WITH CYANOCOBALAMIN 1 each 11   No facility-administered medications prior to visit.    Review of Systems;  Patient denies headache, fevers, malaise, unintentional weight loss, skin rash, eye pain, sinus congestion and sinus pain, sore throat, dysphagia,  hemoptysis , cough, dyspnea, wheezing, chest pain, palpitations, orthopnea, edema, abdominal pain, nausea, melena, diarrhea, constipation, flank pain, dysuria, hematuria, urinary  Frequency, nocturia, numbness, tingling, seizures,  Focal weakness, Loss of consciousness,  Tremor, insomnia, depression, anxiety, and suicidal ideation.      Objective:  BP 102/60   Pulse 70   Ht 4\' 8"  (1.422 m)   Wt 135 lb 6.4 oz (61.4 kg)   LMP 04/11/1981   SpO2 99%   BMI 30.36 kg/m   BP Readings from Last 3 Encounters:  08/22/23 102/60  07/18/23 (!) 105/52  07/07/23 98/63    Wt Readings from Last 3 Encounters:  08/22/23 135 lb 6.4 oz (61.4 kg)  07/07/23 140 lb (63.5 kg)  02/21/23 132 lb 9.6  oz (60.1 kg)    Physical Exam Vitals reviewed.  Constitutional:      General: She is not in acute distress.    Appearance: Normal appearance. She is normal weight. She is not ill-appearing, toxic-appearing or diaphoretic.  HENT:     Head: Normocephalic.  Eyes:     General: No scleral icterus.       Right eye: No discharge.        Left eye: No discharge.     Conjunctiva/sclera: Conjunctivae normal.  Cardiovascular:     Rate and Rhythm: Normal rate and regular rhythm.     Heart sounds: Normal heart sounds.  Pulmonary:     Effort: Pulmonary effort is normal. No respiratory distress.     Breath sounds: Normal breath sounds.  Musculoskeletal:        General: Normal range of motion.  Skin:    General: Skin is warm and dry.  Neurological:     General: No focal deficit present.     Mental Status: She is alert and oriented to person, place, and time. Mental status is at baseline.  Psychiatric:        Mood and Affect: Mood normal.        Behavior: Behavior normal.        Thought Content: Thought content normal.        Judgment: Judgment normal.   Lab Results  Component Value Date   HGBA1C 5.6 02/21/2023   HGBA1C 5.4 07/06/2022   HGBA1C 5.7 12/17/2021    Lab Results  Component Value Date   CREATININE 0.92 02/21/2023   CREATININE 0.83 07/06/2022   CREATININE 0.84 12/17/2021    Lab Results  Component Value Date   WBC 7.1 12/17/2021   HGB 12.6 12/17/2021   HCT 38.3 12/17/2021   PLT 296.0 12/17/2021   GLUCOSE 98 02/21/2023   CHOL 121 02/21/2023   TRIG 100.0 02/21/2023   HDL 54.10 02/21/2023   LDLDIRECT 55.0 02/21/2023   LDLCALC 47 02/21/2023   ALT 19 02/21/2023   AST 31 02/21/2023   NA 141 02/21/2023   K 4.1 02/21/2023   CL 101 02/21/2023   CREATININE 0.92 02/21/2023   BUN 16 02/21/2023   CO2 31 02/21/2023   TSH 4.81 02/21/2023   INR 0.95 07/28/2017   HGBA1C 5.6 02/21/2023    No results found.  Assessment & Plan:  .Hypothyroidism due to acquired atrophy of  thyroid  Diabetes due to undrl condition w oth diabetic neuro comp (HCC)  Dyslipidemia  Colon cancer screening     I spent 34 minutes on the day of this face to face encounter reviewing patient's  most recent visit with cardiology,  nephrology,  and neurology,  prior relevant surgical and non surgical procedures, recent  labs and imaging studies, counseling on weight management,  reviewing the assessment and plan with patient, and post visit ordering and reviewing of  diagnostics and therapeutics with patient  .   Follow-up: No follow-ups on file.   Sherlene Shams, MD

## 2023-08-22 NOTE — Assessment & Plan Note (Signed)
 Patient has resumed Prolia injection after a lapse  for reasons that are unclear.  The facility administers and dr Lorre Munroe [facility]) provides the electronic rx to [facility].  I have sent an rx to pharmacy today to avoid any further delay.  DEXA ordered

## 2023-08-23 ENCOUNTER — Encounter: Payer: Self-pay | Admitting: Internal Medicine

## 2023-08-23 MED ORDER — LEVOTHYROXINE SODIUM 112 MCG PO TABS: 112.0000 ug | ORAL_TABLET | Freq: Every day | ORAL | 11 refills | Status: DC

## 2023-08-23 NOTE — Assessment & Plan Note (Signed)
 Thyroid function  is underactive on  current dose, but it appears that she has not taken it for severalweeks.  Refilling 112 mcg dose  Lab Results  Component Value Date   TSH 14.68 (H) 08/22/2023

## 2023-08-23 NOTE — Assessment & Plan Note (Signed)

## 2023-08-23 NOTE — Assessment & Plan Note (Signed)
 She is overdue or colonoscopy despite previous referrals

## 2023-08-24 ENCOUNTER — Telehealth: Payer: Self-pay

## 2023-08-24 NOTE — Telephone Encounter (Signed)
 Contacted Cindy Oconnor ([employer]) spoke with Cindy Oconnor one of the care coordinators.  Informed Cindy Stanley that Cindy Oconnor has a referral for her colonoscopy to be scheduled however, from my understanding the patients legal guardian listed currently on DPR has deceased.  Cindy Stanley said this is correct now legal guardian is her father Cindy Oconnor. I asked if she would be willing to fax me a copy of the legal guardianship document indicating that Cindy Oconnor is legal guardian.  Once received I will call Cindy Oconnor to ask permission if I can schedule Cindy Oconnor's colonoscopy with her care team.  Informed Cindy Stanley that I will call back later on this week to schedule once I've spoken to [place].  Cindy Oconnor's # [phone]  Thanks, Cindy Oconnor, CMA

## 2023-08-25 ENCOUNTER — Other Ambulatory Visit: Payer: Self-pay

## 2023-08-25 ENCOUNTER — Other Ambulatory Visit: Payer: Self-pay | Admitting: Internal Medicine

## 2023-08-25 DIAGNOSIS — Z79899 Other long term (current) drug therapy: Secondary | ICD-10-CM

## 2023-08-25 MED ORDER — METHOTREXATE SODIUM 2.5 MG PO TABS: ORAL_TABLET | ORAL | 0 refills | Status: AC

## 2023-08-25 NOTE — Telephone Encounter (Signed)
 Refilled: 05/25/2023 Last OV: 08/22/2023 Next OV: not scheduled

## 2023-08-29 NOTE — Telephone Encounter (Unsigned)
 Copied from CRM [id number]. Topic: General - Other >> Aug 29, 2023 12:35 PM Martinique E wrote: Reason for CRM: [place] from [facility] called in regards to the patient taking her Levothyroxine medication. [place] stated that she will check on this and give the clinic a callback to confirm if she is/isn't. Callback number for the group home is [phone] with any questions. >> Aug 29, 2023  3:38 PM Fonda Kinder J wrote: Called in stating the staff was giving the pt this medication with her food. She wanted to leave this message with a nurse and she states to give them a callback if you guys need the pt to come in to have her levels checked

## 2023-08-29 NOTE — Telephone Encounter (Signed)
 Copied from CRM [id number]. Topic: General - Other >> Aug 29, 2023 12:35 PM Martinique E wrote: Reason for CRM: [place] from [facility] called in regards to the patient taking her Levothyroxine medication. [place] stated that she will check on this and give the clinic a callback to confirm if she is/isn't. Callback number for the group home is [phone] with any questions.

## 2023-08-30 ENCOUNTER — Other Ambulatory Visit: Payer: Self-pay | Admitting: Internal Medicine

## 2023-08-30 NOTE — Telephone Encounter (Signed)
 Spoke with [facility] and scheduled pt to come in tomorrow for the blood work on the methotrexate. They also stated that they would find out if she is still seeing [facility] rheumatology and give [place] a call back. See result note message about the levothyroxine.

## 2023-08-31 DIAGNOSIS — G250 Essential tremor: Secondary | ICD-10-CM | POA: Diagnosis not present

## 2023-08-31 DIAGNOSIS — R42 Dizziness and giddiness: Secondary | ICD-10-CM | POA: Diagnosis not present

## 2023-08-31 DIAGNOSIS — Q909 Down syndrome, unspecified: Secondary | ICD-10-CM | POA: Diagnosis not present

## 2023-09-05 ENCOUNTER — Other Ambulatory Visit: Payer: Self-pay | Admitting: Internal Medicine

## 2023-09-05 NOTE — Telephone Encounter (Signed)
 This dose is not in pt's current medication list. Is it okay to refill?

## 2023-09-06 ENCOUNTER — Other Ambulatory Visit

## 2023-09-08 ENCOUNTER — Telehealth: Payer: Self-pay

## 2023-09-08 DIAGNOSIS — K5100 Ulcerative (chronic) pancolitis without complications: Secondary | ICD-10-CM

## 2023-09-08 DIAGNOSIS — Z1211 Encounter for screening for malignant neoplasm of colon: Secondary | ICD-10-CM

## 2023-09-08 NOTE — Telephone Encounter (Signed)
 Spoke with [place], pt's guardian, about getting pt set up for a colonoscopy since she is overdue. He stated that is fine but would like to know if we could do it somewhere local.

## 2023-09-08 NOTE — Telephone Encounter (Signed)
 Copied from CRM [id number]. Topic: Referral - Status >> Sep 08, 2023  3:19 PM Truddie Crumble wrote: Reason for CRM: [place] from [facility] called wanting to know if the office schedules the apptointment for the coloscopy and the bone density test or do the group home have to call CB [phone]

## 2023-09-08 NOTE — Telephone Encounter (Signed)
 [place] with [facility] is aware that the referral has been placed and that someone will reach out to them to schedule.

## 2023-09-08 NOTE — Telephone Encounter (Signed)
 I spoke with Cindy Oconnor to let her know that both the GI office and [facility] will be calling them to schedule the appts for the colonoscopy and the dexa scan.

## 2023-09-11 ENCOUNTER — Other Ambulatory Visit: Payer: Self-pay | Admitting: Internal Medicine

## 2023-09-14 DIAGNOSIS — Z20822 Contact with and (suspected) exposure to covid-19: Secondary | ICD-10-CM | POA: Diagnosis not present

## 2023-09-14 DIAGNOSIS — U071 COVID-19: Secondary | ICD-10-CM | POA: Diagnosis not present

## 2023-09-14 DIAGNOSIS — R062 Wheezing: Secondary | ICD-10-CM | POA: Diagnosis not present

## 2023-10-03 ENCOUNTER — Other Ambulatory Visit: Payer: Self-pay | Admitting: Internal Medicine

## 2023-10-05 ENCOUNTER — Ambulatory Visit: Admitting: Podiatry

## 2023-10-05 ENCOUNTER — Telehealth: Payer: Self-pay | Admitting: Internal Medicine

## 2023-10-05 DIAGNOSIS — E039 Hypothyroidism, unspecified: Secondary | ICD-10-CM

## 2023-10-05 NOTE — Telephone Encounter (Signed)
 Patient need order for TSH as stated in the notes. Thanks

## 2023-10-11 ENCOUNTER — Other Ambulatory Visit

## 2023-10-14 ENCOUNTER — Ambulatory Visit: Admitting: Family Medicine

## 2023-10-17 ENCOUNTER — Ambulatory Visit

## 2023-10-17 VITALS — BP 100/76 | HR 62 | Temp 98.9°F | Ht <= 58 in | Wt 133.2 lb

## 2023-10-17 DIAGNOSIS — R21 Rash and other nonspecific skin eruption: Secondary | ICD-10-CM | POA: Diagnosis not present

## 2023-10-17 DIAGNOSIS — E039 Hypothyroidism, unspecified: Secondary | ICD-10-CM

## 2023-10-17 DIAGNOSIS — R82998 Other abnormal findings in urine: Secondary | ICD-10-CM | POA: Insufficient documentation

## 2023-10-17 DIAGNOSIS — H60541 Acute eczematoid otitis externa, right ear: Secondary | ICD-10-CM | POA: Diagnosis not present

## 2023-10-17 DIAGNOSIS — R410 Disorientation, unspecified: Secondary | ICD-10-CM

## 2023-10-17 DIAGNOSIS — R350 Frequency of micturition: Secondary | ICD-10-CM | POA: Diagnosis not present

## 2023-10-17 LAB — CBC WITH DIFFERENTIAL/PLATELET
Basophils Absolute: 0 10*3/uL (ref 0.0–0.1)
Basophils Relative: 0.7 % (ref 0.0–3.0)
Eosinophils Absolute: 0 10*3/uL (ref 0.0–0.7)
Eosinophils Relative: 0.7 % (ref 0.0–5.0)
HCT: 35.5 % — ABNORMAL LOW (ref 36.0–46.0)
Hemoglobin: 12.1 g/dL (ref 12.0–15.0)
Lymphocytes Relative: 14 % (ref 12.0–46.0)
Lymphs Abs: 0.9 10*3/uL (ref 0.7–4.0)
MCHC: 34 g/dL (ref 30.0–36.0)
MCV: 110.4 fl — ABNORMAL HIGH (ref 78.0–100.0)
Monocytes Absolute: 0.2 10*3/uL (ref 0.1–1.0)
Monocytes Relative: 3.5 % (ref 3.0–12.0)
Neutro Abs: 4.9 10*3/uL (ref 1.4–7.7)
Neutrophils Relative %: 81.1 % — ABNORMAL HIGH (ref 43.0–77.0)
Platelets: 296 10*3/uL (ref 150.0–400.0)
RBC: 3.21 Mil/uL — ABNORMAL LOW (ref 3.87–5.11)
RDW: 16.3 % — ABNORMAL HIGH (ref 11.5–15.5)
WBC: 6.1 10*3/uL (ref 4.0–10.5)

## 2023-10-17 LAB — URINALYSIS, ROUTINE W REFLEX MICROSCOPIC
Bilirubin Urine: NEGATIVE
Hgb urine dipstick: NEGATIVE
Ketones, ur: NEGATIVE
Leukocytes,Ua: NEGATIVE
Nitrite: NEGATIVE
RBC / HPF: NONE SEEN (ref 0–?)
Specific Gravity, Urine: <=1.005 — AB (ref 1.000–1.030)
Total Protein, Urine: NEGATIVE
Urine Glucose: NEGATIVE
Urobilinogen, UA: 0.2 (ref 0.0–1.0)
WBC, UA: NONE SEEN (ref 0–?)
pH: 6.5 (ref 5.0–8.0)

## 2023-10-17 LAB — POC URINALSYSI DIPSTICK (AUTOMATED)
Bilirubin, UA: NEGATIVE
Blood, UA: NEGATIVE
Glucose, UA: NEGATIVE
Ketones, UA: NEGATIVE
Leukocytes, UA: NEGATIVE
Nitrite, UA: NEGATIVE
Protein, UA: NEGATIVE
Spec Grav, UA: <=1.005 — AB (ref 1.010–1.025)
Urobilinogen, UA: 0.2 U/dL
pH, UA: 6 (ref 5.0–8.0)

## 2023-10-17 LAB — BASIC METABOLIC PANEL WITH GFR
BUN: 19 mg/dL (ref 6–23)
CO2: 30 meq/L (ref 19–32)
Calcium: 9 mg/dL (ref 8.4–10.5)
Chloride: 104 meq/L (ref 96–112)
Creatinine, Ser: 0.95 mg/dL (ref 0.40–1.20)
GFR: 67.61 mL/min (ref >60.00)
Glucose, Bld: 116 mg/dL — ABNORMAL HIGH (ref 70–99)
Potassium: 4.8 meq/L (ref 3.5–5.1)
Sodium: 141 meq/L (ref 135–145)

## 2023-10-17 MED ORDER — KETOCONAZOLE 2 % EX CREA: 1.0000 | TOPICAL_CREAM | Freq: Two times a day (BID) | CUTANEOUS | 0 refills | Status: AC

## 2023-10-17 NOTE — Patient Instructions (Addendum)
-   I am checking urine, thyroid  hormone level, CBC, BMP.  - If urine shows signs of urinary tract infection, I recommend starting the following treatment: Antibiotic called Ciprofloxacin  250 mg, twice a day for 5 days.  To reduce antibiotic side effects like diarrhea, vaginal yeast infection, c. Dif recommend taking probiotic daily for 7 days. Take Cuturelle, 1 capsule daily for 7 days. If this is not covered by your insurance, please pick this over the counter.   - Make sure you are drinking about 40-50 OZ of water daily.  - Apply neosporin  cream on right elbow twice a day for 7 days to help heal the skin lesion.  - Apply Ketoconazole  2% cream on the inside of right eat, twice a day for 10 days for eczema of right ear.

## 2023-10-17 NOTE — Progress Notes (Signed)
 Acute Office Visit  Subjective:    Patient ID: Cindy Oconnor, female    DOB: 05-17-1971, 53 y.o.   MRN: [id number]  Chief Complaint  Patient presents with   Urinary Tract Infection    HPI Cindy Oconnor is a 53 y.o. female with medical h/o Down's syndrome, hypothyroidism,  asthma, RA, hyperlipidemia, GERD presenting with Cindy Oconnor from [facility] group home for evaluation of following acute concerns (h/o obtained from both patient and Cindy Oconnor):  -- Dark urine color for 1 day.  -- Change from baseline mental state including seeming more confused, low energy for more than 3 weeks. Requesting referral to neurology. No fever, chills, diarrhea, stomach pain. Has not complained of dysuria, frequency, urgency of urination.  -- H/O Enterococcus Faecalis in 07/28/2017, sensitive to Ampicillin, Nitrofurantoin, Levofloxacin , Vancomycin.  -- Normal renal function on 08/22/2023. -- Elevated TSH at 14.68 on 08/22/2023. 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   ROS As per HPI    Objective:    BP 100/76   Pulse 62   Temp 98.9 F (37.2 C) (Oral)   Ht 4\' 8"  (1.422 m)   Wt 133 lb 3.2 oz (60.4 kg)   LMP 04/11/1981   SpO2 98%   BMI 29.86 kg/m    Physical Exam Constitutional:      General: She is not in acute distress.    Comments: Well-groomed, cooperative  HENT:     Right Ear: There is impacted cerumen.     Left Ear: There is impacted cerumen.     Ears:     Comments: Scaly patch on right external ear <1 cm in diameter. No surrounding infection. Cardiovascular:     Rate and Rhythm: Normal rate.  Pulmonary:     Breath sounds: No wheezing.  Abdominal:     General: Bowel sounds are normal.     Palpations: Abdomen is soft.     Tenderness: There is no abdominal tenderness. There is no right CVA tenderness, left CVA tenderness or guarding.  Musculoskeletal:     Cervical back: Neck supple.   Lymphadenopathy:     Cervical: No cervical adenopathy.  Skin:    General: Skin is warm.     Comments: Small superficial on right elbow, measuring about 1 cm in diameter, no surrounding erythema, swelling noted.  Neurological:     Mental Status: Mental status is at baseline.     Results for orders placed or performed in visit on 10/17/23  POCT Urinalysis Dipstick (Automated)  Result Value Ref Range   Color, UA Yellow    Clarity, UA Clear    Glucose, UA Negative Negative   Bilirubin, UA Negative    Ketones, UA Negative    Spec Grav, UA <=1.005 (A) 1.010 - 1.025   Blood, UA Negative    pH, UA 6.0 5.0 - 8.0   Protein, UA Negative Negative   Urobilinogen, UA 0.2 0.2 or 1.0 E.U./dL   Nitrite, UA Negative    Leukocytes, UA Negative Negative       Assessment & Plan:  53 year old female presenting with caregiver from group home she resides in for concern regarding UTI, and requesting neurology referral from change in mentation from her baseline.   Dark urine Assessment & Plan: Encourage increasing water consumption to 40-50 Oz per day. Check UA, culture, BMP today. Management pending resultst.   Orders: -     POCT Urinalysis  Dipstick (Automated) -     Urinalysis, Routine w reflex microscopic -     Urine Culture -     Basic metabolic panel with GFR  Confusion Assessment & Plan: D/D includes UTI, uncontrolled hypothyroidism, hemato/renal abnormality, infection, depression, dementia, current medical conditions.   Counseled caregiver Cindy Oconnor (phone number:[phone]) that patient is already established with [facility] neurology (appointment with Dr. Walden Guise on 08/31/2023). He recommended f/u with him in 6 months with consideration for imaging if symptoms of dizziness, tremor persists. I counseled Cindy Oconnor reach out to Dr. Imelda Man clinic to reschedule an appointment and she should not need a referral since she is already an established patient of him.   Check TSH, CBC, BMP, UA/culture.    Orders: -     POCT Urinalysis Dipstick (Automated) -     Urinalysis, Routine w reflex microscopic -     Urine Culture -     CBC with Differential/Platelet  Acquired hypothyroidism Assessment & Plan: Elevated TSH at 14.68 on 08/22/2023. Recheck TSH, continue Levothyroxine  112 mcg  Orders: -     TSH  Rash Assessment & Plan: Right elbow as documented on physical exam. Has Neosporin  already prescribed to her, recommend applying Neosporin  ointment, twice a day for 7 days.    Eczema of external ear, right Assessment & Plan: Ketoconazole  2% cream, apply twice a day for 14 days.        Orders: -     Ketoconazole ; Apply 1 Application topically 2 (two) times daily for 14 days.  Dispense: 28 g; Refill: 0    Return in about 2 weeks (around 10/31/2023) for Chronic follow up with PCP in 2 weeks. Caregiver counseled on red flag symptoms like fever, chills, vomiting, flank pain, abdominal pain and recommend emergent evaluation if above were to happen.   Jacklin Mascot, MD

## 2023-10-17 NOTE — Assessment & Plan Note (Signed)
 Elevated TSH at 14.68 on 08/22/2023. Recheck TSH, continue Levothyroxine  112 mcg

## 2023-10-17 NOTE — Assessment & Plan Note (Signed)
 Right elbow as documented on physical exam. Has Neosporin  already prescribed to her, recommend applying Neosporin  ointment, twice a day for 7 days.

## 2023-10-17 NOTE — Assessment & Plan Note (Signed)
 Ketoconazole  2% cream, apply twice a day for 14 days.

## 2023-10-17 NOTE — Assessment & Plan Note (Signed)
 Encourage increasing water consumption to 40-50 Oz per day. Check UA, culture, BMP today. Management pending resultst.

## 2023-10-17 NOTE — Assessment & Plan Note (Signed)
 D/D includes UTI, uncontrolled hypothyroidism, hemato/renal abnormality, infection, depression, dementia, current medical conditions.   Counseled caregiver Art Bigness (phone number:[phone]) that patient is already established with [facility] neurology (appointment with Dr. Walden Guise on 08/31/2023). He recommended f/u with him in 6 months with consideration for imaging if symptoms of dizziness, tremor persists. I counseled Art Bigness reach out to Dr. Imelda Man clinic to reschedule an appointment and she should not need a referral since she is already an established patient of him.   Check TSH, CBC, BMP, UA/culture.

## 2023-10-18 ENCOUNTER — Ambulatory Visit: Payer: Self-pay

## 2023-10-18 LAB — URINE CULTURE
MICRO NUMBER:: 16472759
Result:: NO GROWTH
SPECIMEN QUALITY:: ADEQUATE

## 2023-10-18 LAB — TSH: TSH: 7.04 u[IU]/mL — ABNORMAL HIGH (ref 0.35–5.50)

## 2023-10-18 NOTE — Progress Notes (Signed)
 Relay to patient that her urine result so far does not suggest UTI and antibiotic is not recommended at this time. If urine culture shows bacteria growth we will reach out to her with recommendations.   Thank you,  Jacklin Mascot, MD

## 2023-10-20 DIAGNOSIS — B351 Tinea unguium: Secondary | ICD-10-CM | POA: Diagnosis not present

## 2023-10-20 DIAGNOSIS — M2042 Other hammer toe(s) (acquired), left foot: Secondary | ICD-10-CM | POA: Diagnosis not present

## 2023-10-20 DIAGNOSIS — M79674 Pain in right toe(s): Secondary | ICD-10-CM | POA: Diagnosis not present

## 2023-10-20 DIAGNOSIS — M79675 Pain in left toe(s): Secondary | ICD-10-CM | POA: Diagnosis not present

## 2023-10-20 DIAGNOSIS — M2041 Other hammer toe(s) (acquired), right foot: Secondary | ICD-10-CM | POA: Diagnosis not present

## 2023-10-26 DIAGNOSIS — H6123 Impacted cerumen, bilateral: Secondary | ICD-10-CM | POA: Diagnosis not present

## 2023-10-26 DIAGNOSIS — H903 Sensorineural hearing loss, bilateral: Secondary | ICD-10-CM | POA: Diagnosis not present

## 2023-11-02 ENCOUNTER — Ambulatory Visit (INDEPENDENT_AMBULATORY_CARE_PROVIDER_SITE_OTHER): Admitting: Internal Medicine

## 2023-11-02 ENCOUNTER — Encounter: Payer: Self-pay | Admitting: Internal Medicine

## 2023-11-02 VITALS — BP 122/64 | HR 67 | Ht <= 58 in | Wt 131.6 lb

## 2023-11-02 DIAGNOSIS — E559 Vitamin D deficiency, unspecified: Secondary | ICD-10-CM | POA: Diagnosis not present

## 2023-11-02 DIAGNOSIS — M1990 Unspecified osteoarthritis, unspecified site: Secondary | ICD-10-CM | POA: Diagnosis not present

## 2023-11-02 DIAGNOSIS — Z79899 Other long term (current) drug therapy: Secondary | ICD-10-CM

## 2023-11-02 DIAGNOSIS — R82998 Other abnormal findings in urine: Secondary | ICD-10-CM | POA: Diagnosis not present

## 2023-11-02 DIAGNOSIS — E034 Atrophy of thyroid (acquired): Secondary | ICD-10-CM | POA: Diagnosis not present

## 2023-11-02 DIAGNOSIS — E538 Deficiency of other specified B group vitamins: Secondary | ICD-10-CM

## 2023-11-02 MED ORDER — LEVOTHYROXINE SODIUM 125 MCG PO TABS: 125.0000 ug | ORAL_TABLET | Freq: Every day | ORAL | 1 refills | Status: DC

## 2023-11-02 NOTE — Patient Instructions (Signed)
 I  HAVE INCREASED Cindy Oconnor'S LEVOTHYROXINE  DOSE TO 125 MCG  PLEASE GIVE DOSE ON AN EMPTY STOMACH, 30 MINUTES PRIOR TO FOOD  RETURN FOR LABS ONLY  IN 6 WEEKS  RETURN TO SEE ME IN 3 MONTHS

## 2023-11-02 NOTE — Progress Notes (Unsigned)
 Subjective:  Patient ID: Cindy Oconnor, female    DOB: April 26, 1971  Age: 53 y.o. MRN: [id number]  CC: There were no encounter diagnoses.   HPI CARLITA WHITCOMB presents for  Chief Complaint  Patient presents with   Medical Management of Chronic Issues   1) hypothyroidism:  thyroid  was underactive in March and dose was increased.  She was  recently seen by Dr Casimir Cleaver for lethargy. Etc and repeat TSH was dimporved but not normalized yet after resuming levothyroxine  which was not being given for unclear reasons.   Lab Results  Component Value Date   TSH 7.04 (H) 10/17/2023    2) inflammatory arthritis: her last appt with [facility] Rheumatology was in February  she is taking methotrexate  weekly   Outpatient Medications Prior to Visit  Medication Sig Dispense Refill   acetaminophen  (TYLENOL ) 650 MG CR tablet TAKE 1 TABLET BY MOUTH EVERY EIGHT HOURS 12 tablet 10   Acetaminophen  Extra Strength 500 MG TABS TAKE 2 TABLETS =1000MG  BY MOUTH EVERY EIGHT HOURS *DO NOT EXCEED 4GM OF TYLENOL  IN 24 HOURS* 14 tablet 10   ADVAIR HFA 230-21 MCG/ACT inhaler INHALE 2 PUFFS INTO LUNGS TWICE DAILY (BRAND PER INS) 12 g 11   ARTHRITIS PAIN RELIEVER 1 % GEL APPLY 2 G TOPICALLY 4 (FOUR) TIMES DAILY AS NEEDED FOR JOINT PAIN. 150 g 11   aspirin  EC (ASPIRIN  LOW DOSE) 81 MG tablet TAKE 1 TABLET BY MOUTH ONCE DAILY 90 tablet 3   atorvastatin  (LIPITOR) 20 MG tablet Take 1 tablet (20 mg total) by mouth daily. 30 tablet 11   [place]yr Saline Nasal No-Drip GEL INSTILL INTO NOSE TWICE DAILY TO MOISTURIZE LINING OF NOSE;INSTILL INTO NOSE TWICE A DAY AS NEEDED FOR DRY NOSE 22 mL 11   azelastine  (OPTIVAR ) 0.05 % ophthalmic solution PLACE 1 DROP EACH EYE TWICE DAILY  *WAIT 3-5 MINUTES BETWEEN 2 EYE MEDS* *STORE UPRIGHT* 6 mL 11   BLUE GEL 2 % GEL APPLY TOPICALLY TO PAINFUL JOINTS 2 TO 3 TIMES DAILY AS NEEDED 226.8 g 11   Calcium  Carb-Cholecalciferol  (OYSTER SHELL CALCIUM  W/D) 500-5 MG-MCG TABS TAKE 1 TABLET  BY MOUTH ONCE DAILY FOR  SUPPLEMENT 7 tablet 10   chlorhexidine  (PERIDEX ) 0.12 % solution Use as directed 15 mLs in the mouth or throat 2 (two) times daily.     clotrimazole -betamethasone  (LOTRISONE ) cream APPLY TOPICALLY 2 TIMES PER DAY 45 g 2   cyanocobalamin  (VITAMIN B12) 1000 MCG/ML injection INJECT 1ML=1000MCG INTRAMUSCULARLY EVERY MONTH ON THE 30TH 1 mL 11   DELSYM  30 MG/5ML liquid TAKE 1 TEASPOONFUL BY MOUTH FOUR TIMES A DAY AS NEEDED *SHAKE WELL* 89 mL 4   diazepam  (VALIUM ) 5 MG tablet Take 1 tablet (5 mg total) by mouth every 12 (twelve) hours as needed for anxiety. 10 tablet 0   docusate sodium  (COLACE) 100 MG capsule TAKE 1 CAPSULE BY MOUTH EVERY OTHER DAY *DO NOT CRUSH OR CHEW* 1 capsule 10   fluticasone  (FLONASE) 50 MCG/ACT nasal spray USE 2 SPRAYS EACH NOSTRIL ONCE DAILY *SHAKE GENTLY* 16 g 11   folic acid  (FOLVITE ) 1 MG tablet TAKE 1 TABLET BY MOUTH EVERY DAY 30 tablet 2   gabapentin  (NEURONTIN ) 300 MG capsule Take 300 mg by mouth 2 (two) times daily.     gentamicin  ointment (GARAMYCIN ) 0.1 % Apply topically.     GUAIATUSSIN AC 100-10 MG/5ML syrup TAKE BY MOUTH THREE TIMES A DAY AS NEEDED FOR COUGH 180 mL 4   ipratropium (ATROVENT ) 0.02 %  nebulizer solution INHALE 1 VIAL VIA NEBULIZER FOUR TIMES A DAY AS NEEDED FOR WHEEZING OR COUGH 150 mL 11   levalbuterol  (XOPENEX ) 0.63 MG/3ML nebulizer solution Take 3 mLs (0.63 mg total) by nebulization every 8 (eight) hours as needed for wheezing or shortness of breath. 180 mL 11   levothyroxine  (SYNTHROID ) 112 MCG tablet Take 1 tablet (112 mcg total) by mouth daily. 30 tablet 11   methotrexate  (RHEUMATREX) 2.5 MG tablet TAKE 7 TABLETS (17.5 MG) BY MOUTH ONCE A WEEK ON FRIDAY. CAUTION: CHEMOTHERAPY. PROTECT FROM LIGHT. NOTE DOSE AND FREQUENCY.   HAZARDOUS DRUG: WEAR GLOVES. DO NOT CFTAKE 7 TABLETS (17.5 MG) BY MOUTH ONCE A WEEK ON FRIDAY. CAUTION: CHEMOTHERAPY. PROTECT FROM LIGHT. NOTE DOSE AND FREQUENCY.   HAZARDOUS DRUG: WEAR GLOVES. DO NOT CRUSH 7 tablet 0    mometasone  (ELOCON ) 0.1 % cream APPLY TWO TIMES DAILY TO LEFT OUTER EAR FOR ECZEMA 15 g 1   montelukast  (SINGULAIR ) 10 MG tablet Take 1 tablet (10 mg total) by mouth at bedtime. 30 tablet 11   Multiple Vitamins-Minerals (THERA-TABS M) TABS TAKE 1 TABLET BY MOUTH ONCE DAILY  FOR SUPPLEMENT *TAKE WITH FOOD* 3 tablet 11   omeprazole  (PRILOSEC) 20 MG capsule TAKE 1 CAPSULE BY MOUTH TWICE DAILY *DO NOT CRUSH OR CHEW* 60 capsule 11   predniSONE  (DELTASONE ) 2.5 MG tablet TAKE 1 TABLET BY MOUTH EACH DAY 30 tablet 11   PROLIA  60 MG/ML SOSY injection Inject 60 mg into the skin every 6 (six) months. 180 mL 2   SENNA-TIME 8.6 MG tablet TAKE 2 TABLETS BY MOUTH AT BEDTIME 14 tablet 10   sertraline  (ZOLOFT ) 25 MG tablet Take 1 tablet (25 mg total) by mouth daily. 7 tablet 10   sodium fluoride (PREVIDENT 5000 PLUS) 1.1 % CREA dental cream Place 1 Application onto teeth every evening.     SYRINGE-NEEDLE, DISP, 3 ML (BD ECLIPSE SYRINGE) 25G X 1" 3 ML MISC FOR USE WITH CYANOCOBALAMIN  1 each 11   amoxicillin  (AMOXIL ) 500 MG capsule TAKE 4 CAPSULES (2000MG ) BY MOUTH AT ONCE 1-HR PRIOR TO DENTAL APPT (Patient not taking: Reported on 11/02/2023) 4 capsule 10   No facility-administered medications prior to visit.    Review of Systems;  Patient denies headache, fevers, malaise, unintentional weight loss, skin rash, eye pain, sinus congestion and sinus pain, sore throat, dysphagia,  hemoptysis , cough, dyspnea, wheezing, chest pain, palpitations, orthopnea, edema, abdominal pain, nausea, melena, diarrhea, constipation, flank pain, dysuria, hematuria, urinary  Frequency, nocturia, numbness, tingling, seizures,  Focal weakness, Loss of consciousness,  Tremor, insomnia, depression, anxiety, and suicidal ideation.      Objective:  BP 122/64   Pulse 67   Ht 4\' 8"  (1.422 m)   Wt 131 lb 9.6 oz (59.7 kg)   LMP 04/11/1981   SpO2 96%   BMI 29.50 kg/m   BP Readings from Last 3 Encounters:  11/02/23 122/64  10/17/23 100/76   08/22/23 102/60    Wt Readings from Last 3 Encounters:  11/02/23 131 lb 9.6 oz (59.7 kg)  10/17/23 133 lb 3.2 oz (60.4 kg)  08/22/23 135 lb 6.4 oz (61.4 kg)    Physical Exam  Lab Results  Component Value Date   HGBA1C 5.7 08/22/2023   HGBA1C 5.6 02/21/2023   HGBA1C 5.4 07/06/2022    Lab Results  Component Value Date   CREATININE 0.95 10/17/2023   CREATININE 0.85 08/22/2023   CREATININE 0.92 02/21/2023    Lab Results  Component Value Date  WBC 6.1 10/17/2023   HGB 12.1 10/17/2023   HCT 35.5 (L) 10/17/2023   PLT 296.0 10/17/2023   GLUCOSE 116 (H) 10/17/2023   CHOL 121 08/22/2023   TRIG 111.0 08/22/2023   HDL 46.60 08/22/2023   LDLDIRECT 55.0 08/22/2023   LDLCALC 52 08/22/2023   ALT 19 08/22/2023   AST 33 08/22/2023   NA 141 10/17/2023   K 4.8 10/17/2023   CL 104 10/17/2023   CREATININE 0.95 10/17/2023   BUN 19 10/17/2023   CO2 30 10/17/2023   TSH 7.04 (H) 10/17/2023   INR 0.95 07/28/2017   HGBA1C 5.7 08/22/2023   MICROALBUR <0.7 08/22/2023    No results found.  Assessment & Plan:  .There are no diagnoses linked to this encounter.   I spent 34 minutes on the day of this face to face encounter reviewing patient's  most recent visit with cardiology,  nephrology,  and neurology,  prior relevant surgical and non surgical procedures, recent  labs and imaging studies, counseling on weight management,  reviewing the assessment and plan with patient, and post visit ordering and reviewing of  diagnostics and therapeutics with patient  .   Follow-up: No follow-ups on file.   Thersia Flax, MD

## 2023-11-04 NOTE — Assessment & Plan Note (Addendum)
 Her diagnosis has changed several times , upon review of various rheumatologists and orthopedist notes from 2018 forward.  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 She is taking MTX and folic acid  , prescribed by Rheum and surveillance labs have been NORMAL   Lab Results  Component Value Date   FOLATE >24.2 02/21/2023     Lab Results  Component Value Date   WBC 6.1 10/17/2023   HGB 12.1 10/17/2023   HCT 35.5 (L) 10/17/2023   MCV 110.4 Repeated and verified X2. (H) 10/17/2023   PLT 296.0 10/17/2023   Lab Results  Component Value Date   ALT 19 08/22/2023   AST 33 08/22/2023   ALKPHOS 55 08/22/2023   BILITOT 0.3 08/22/2023

## 2023-11-04 NOTE — Assessment & Plan Note (Signed)
 UTI ruled out. She is drinking adequate amoutns of water and gatorade . Renal function normal  Lab Results  Component Value Date   CREATININE 0.95 10/17/2023

## 2023-11-04 NOTE — Assessment & Plan Note (Addendum)
 Thyroid  function  REMAINS underactive on  current dose OF 112 mcg dAUY,  DOSE INCREASED TO 125 MCG TODAY. REPEAT TSH IN 6 WEEKS   Lab Results  Component Value Date   TSH 7.04 (H) 10/17/2023

## 2023-11-11 ENCOUNTER — Other Ambulatory Visit: Payer: Self-pay | Admitting: Internal Medicine

## 2023-11-21 DIAGNOSIS — K5904 Chronic idiopathic constipation: Secondary | ICD-10-CM | POA: Diagnosis not present

## 2023-12-14 ENCOUNTER — Other Ambulatory Visit

## 2023-12-21 ENCOUNTER — Other Ambulatory Visit: Payer: Self-pay | Admitting: Internal Medicine

## 2023-12-21 DIAGNOSIS — Z1231 Encounter for screening mammogram for malignant neoplasm of breast: Secondary | ICD-10-CM

## 2023-12-27 DIAGNOSIS — K529 Noninfective gastroenteritis and colitis, unspecified: Secondary | ICD-10-CM | POA: Diagnosis not present

## 2023-12-27 DIAGNOSIS — R933 Abnormal findings on diagnostic imaging of other parts of digestive tract: Secondary | ICD-10-CM | POA: Diagnosis not present

## 2023-12-28 ENCOUNTER — Other Ambulatory Visit: Payer: Self-pay | Admitting: Gastroenterology

## 2023-12-28 DIAGNOSIS — K529 Noninfective gastroenteritis and colitis, unspecified: Secondary | ICD-10-CM

## 2023-12-28 DIAGNOSIS — R933 Abnormal findings on diagnostic imaging of other parts of digestive tract: Secondary | ICD-10-CM

## 2024-01-09 DIAGNOSIS — M79672 Pain in left foot: Secondary | ICD-10-CM | POA: Diagnosis not present

## 2024-01-09 DIAGNOSIS — M79671 Pain in right foot: Secondary | ICD-10-CM | POA: Diagnosis not present

## 2024-01-09 DIAGNOSIS — Q909 Down syndrome, unspecified: Secondary | ICD-10-CM | POA: Diagnosis not present

## 2024-01-09 DIAGNOSIS — L84 Corns and callosities: Secondary | ICD-10-CM | POA: Diagnosis not present

## 2024-01-10 ENCOUNTER — Other Ambulatory Visit: Payer: Self-pay | Admitting: Internal Medicine

## 2024-01-10 DIAGNOSIS — Q909 Down syndrome, unspecified: Secondary | ICD-10-CM | POA: Diagnosis not present

## 2024-01-10 DIAGNOSIS — M1990 Unspecified osteoarthritis, unspecified site: Secondary | ICD-10-CM | POA: Diagnosis not present

## 2024-01-10 DIAGNOSIS — M810 Age-related osteoporosis without current pathological fracture: Secondary | ICD-10-CM | POA: Diagnosis not present

## 2024-01-10 DIAGNOSIS — Z79631 Long term (current) use of antimetabolite agent: Secondary | ICD-10-CM | POA: Diagnosis not present

## 2024-01-10 DIAGNOSIS — Z79899 Other long term (current) drug therapy: Secondary | ICD-10-CM | POA: Diagnosis not present

## 2024-01-18 ENCOUNTER — Ambulatory Visit
Admission: RE | Admit: 2024-01-18 | Discharge: 2024-01-18 | Disposition: A | Discharge status: Home / Self Care | Source: Ambulatory Visit | Attending: Gastroenterology | Admitting: Gastroenterology

## 2024-01-18 DIAGNOSIS — N3289 Other specified disorders of bladder: Secondary | ICD-10-CM | POA: Diagnosis not present

## 2024-01-18 DIAGNOSIS — R1030 Lower abdominal pain, unspecified: Secondary | ICD-10-CM | POA: Diagnosis not present

## 2024-01-18 DIAGNOSIS — Z90710 Acquired absence of both cervix and uterus: Secondary | ICD-10-CM | POA: Diagnosis not present

## 2024-01-18 DIAGNOSIS — R933 Abnormal findings on diagnostic imaging of other parts of digestive tract: Secondary | ICD-10-CM | POA: Diagnosis not present

## 2024-01-18 DIAGNOSIS — K5900 Constipation, unspecified: Secondary | ICD-10-CM | POA: Diagnosis not present

## 2024-01-18 DIAGNOSIS — K529 Noninfective gastroenteritis and colitis, unspecified: Secondary | ICD-10-CM | POA: Diagnosis not present

## 2024-01-18 MED ORDER — IOHEXOL 300 MG/ML  SOLN
80.0000 mL | Freq: Once | INTRAMUSCULAR | Status: AC | PRN
  Administered 2024-01-18: 80 mL via INTRAVENOUS

## 2024-01-19 ENCOUNTER — Ambulatory Visit
Admission: RE | Admit: 2024-01-19 | Discharge: 2024-01-19 | Disposition: A | Discharge status: Home / Self Care | Source: Ambulatory Visit | Attending: Internal Medicine | Admitting: Internal Medicine

## 2024-01-19 ENCOUNTER — Ambulatory Visit: Payer: Self-pay | Admitting: Gastroenterology

## 2024-01-19 DIAGNOSIS — M818 Other osteoporosis without current pathological fracture: Secondary | ICD-10-CM | POA: Insufficient documentation

## 2024-01-19 DIAGNOSIS — T50905A Adverse effect of unspecified drugs, medicaments and biological substances, initial encounter: Secondary | ICD-10-CM | POA: Diagnosis not present

## 2024-01-19 DIAGNOSIS — Z1231 Encounter for screening mammogram for malignant neoplasm of breast: Secondary | ICD-10-CM | POA: Insufficient documentation

## 2024-01-19 DIAGNOSIS — M810 Age-related osteoporosis without current pathological fracture: Secondary | ICD-10-CM | POA: Diagnosis not present

## 2024-01-19 DIAGNOSIS — Z780 Asymptomatic menopausal state: Secondary | ICD-10-CM | POA: Diagnosis not present

## 2024-01-19 NOTE — Progress Notes (Signed)
 Please inform patient that the CT enterography raise the suspicion for inflammatory bowel disease, possible Crohn's.  However, not definite at this point.  Also, please remind her caregiver about stool specimen for fecal calprotectin test.  I really need to see the result of the test.  Also, I recommend MR enterography for further evaluation of small bowel Dx: Abnormal CT abdomen  RV

## 2024-01-22 ENCOUNTER — Ambulatory Visit: Payer: Self-pay | Admitting: Internal Medicine

## 2024-01-23 ENCOUNTER — Other Ambulatory Visit: Payer: Self-pay | Admitting: Gastroenterology

## 2024-01-23 DIAGNOSIS — R935 Abnormal findings on diagnostic imaging of other abdominal regions, including retroperitoneum: Secondary | ICD-10-CM

## 2024-01-23 DIAGNOSIS — R933 Abnormal findings on diagnostic imaging of other parts of digestive tract: Secondary | ICD-10-CM | POA: Diagnosis not present

## 2024-01-23 NOTE — Telephone Encounter (Signed)
 Copied from CRM #[id number]. Topic: General - Other >> Jan 23, 2024 10:25 AM Zebedee SAUNDERS wrote: Reason for CRM: Pt's father called for pt's lab results which were provided. Father did not have any questions.

## 2024-01-26 ENCOUNTER — Other Ambulatory Visit: Payer: Self-pay | Admitting: Internal Medicine

## 2024-01-31 ENCOUNTER — Ambulatory Visit
Admission: RE | Admit: 2024-01-31 | Discharge: 2024-01-31 | Disposition: A | Discharge status: Home / Self Care | Source: Ambulatory Visit | Attending: Gastroenterology | Admitting: Gastroenterology

## 2024-01-31 DIAGNOSIS — K5730 Diverticulosis of large intestine without perforation or abscess without bleeding: Secondary | ICD-10-CM | POA: Diagnosis not present

## 2024-01-31 DIAGNOSIS — K5900 Constipation, unspecified: Secondary | ICD-10-CM | POA: Diagnosis not present

## 2024-01-31 DIAGNOSIS — R1030 Lower abdominal pain, unspecified: Secondary | ICD-10-CM | POA: Diagnosis not present

## 2024-01-31 DIAGNOSIS — R935 Abnormal findings on diagnostic imaging of other abdominal regions, including retroperitoneum: Secondary | ICD-10-CM

## 2024-01-31 MED ORDER — GADOBUTROL 1 MMOL/ML IV SOLN
6.0000 mL | Freq: Once | INTRAVENOUS | Status: AC | PRN
  Administered 2024-01-31: 6 mL via INTRAVENOUS

## 2024-02-03 ENCOUNTER — Ambulatory Visit: Payer: Self-pay | Admitting: Gastroenterology

## 2024-02-27 ENCOUNTER — Other Ambulatory Visit: Payer: Self-pay | Admitting: Internal Medicine

## 2024-03-06 DIAGNOSIS — Q909 Down syndrome, unspecified: Secondary | ICD-10-CM | POA: Diagnosis not present

## 2024-03-06 DIAGNOSIS — G250 Essential tremor: Secondary | ICD-10-CM | POA: Diagnosis not present

## 2024-03-06 DIAGNOSIS — R29898 Other symptoms and signs involving the musculoskeletal system: Secondary | ICD-10-CM | POA: Diagnosis not present

## 2024-03-06 DIAGNOSIS — R262 Difficulty in walking, not elsewhere classified: Secondary | ICD-10-CM | POA: Diagnosis not present

## 2024-03-06 DIAGNOSIS — R2689 Other abnormalities of gait and mobility: Secondary | ICD-10-CM | POA: Diagnosis not present

## 2024-03-06 DIAGNOSIS — R42 Dizziness and giddiness: Secondary | ICD-10-CM | POA: Diagnosis not present

## 2024-03-09 ENCOUNTER — Ambulatory Visit (INDEPENDENT_AMBULATORY_CARE_PROVIDER_SITE_OTHER)

## 2024-03-09 DIAGNOSIS — Z23 Encounter for immunization: Secondary | ICD-10-CM

## 2024-03-09 NOTE — Progress Notes (Signed)
 After obtaining consent, and per orders of Dr.Tullo, MD injection of Fluzone vaccine given in L Deltoid by Doyce Croak, CMA. Patient tolerated injection well.

## 2024-03-16 ENCOUNTER — Other Ambulatory Visit: Payer: Self-pay | Admitting: Internal Medicine

## 2024-03-21 ENCOUNTER — Telehealth: Payer: Self-pay

## 2024-03-21 NOTE — Telephone Encounter (Signed)
 PA needed for Ipratropium nebulizer solution.

## 2024-03-22 ENCOUNTER — Other Ambulatory Visit (HOSPITAL_COMMUNITY): Payer: Self-pay

## 2024-03-22 ENCOUNTER — Telehealth: Payer: Self-pay

## 2024-03-22 NOTE — Telephone Encounter (Signed)
 Pharmacy Patient Advocate Encounter  Received notification from [employer] that Prior Authorization for  Ipratropium Bromide  0.02% solution  has been APPROVED from 03/22/24 to 05/30/2098   PA #/Case ID/Reference #: [id number]

## 2024-03-22 NOTE — Telephone Encounter (Signed)
 Pharmacy Patient Advocate Encounter   Received notification from Onbase that prior authorization for Ipratropium Bromide  0.02% solution  is required/requested.   Insurance verification completed.   The patient is insured through [facility].   Per test claim: PA required; PA submitted to above mentioned insurance via Latent Key/confirmation #/EOC [id number] Status is pending

## 2024-03-23 ENCOUNTER — Other Ambulatory Visit: Payer: Self-pay | Admitting: Internal Medicine

## 2024-03-26 ENCOUNTER — Other Ambulatory Visit (HOSPITAL_COMMUNITY): Payer: Self-pay

## 2024-04-03 ENCOUNTER — Other Ambulatory Visit: Payer: Self-pay | Admitting: Internal Medicine

## 2024-04-10 ENCOUNTER — Other Ambulatory Visit: Payer: Self-pay | Admitting: Internal Medicine

## 2024-04-11 DIAGNOSIS — H903 Sensorineural hearing loss, bilateral: Secondary | ICD-10-CM | POA: Diagnosis not present

## 2024-04-11 DIAGNOSIS — H6123 Impacted cerumen, bilateral: Secondary | ICD-10-CM | POA: Diagnosis not present

## 2024-04-11 DIAGNOSIS — R42 Dizziness and giddiness: Secondary | ICD-10-CM | POA: Diagnosis not present

## 2024-04-12 DIAGNOSIS — Z8719 Personal history of other diseases of the digestive system: Secondary | ICD-10-CM | POA: Diagnosis not present

## 2024-04-12 DIAGNOSIS — K5909 Other constipation: Secondary | ICD-10-CM | POA: Diagnosis not present

## 2024-04-20 ENCOUNTER — Ambulatory Visit (INDEPENDENT_AMBULATORY_CARE_PROVIDER_SITE_OTHER): Admitting: Podiatry

## 2024-04-20 ENCOUNTER — Encounter: Payer: Self-pay | Admitting: Podiatry

## 2024-04-20 DIAGNOSIS — M79671 Pain in right foot: Secondary | ICD-10-CM

## 2024-04-20 DIAGNOSIS — D689 Coagulation defect, unspecified: Secondary | ICD-10-CM

## 2024-04-20 DIAGNOSIS — L84 Corns and callosities: Secondary | ICD-10-CM

## 2024-04-20 DIAGNOSIS — L03032 Cellulitis of left toe: Secondary | ICD-10-CM

## 2024-04-20 MED ORDER — DOXYCYCLINE HYCLATE 100 MG PO CAPS: 100.0000 mg | ORAL_CAPSULE | Freq: Two times a day (BID) | ORAL | 0 refills | Status: AC

## 2024-04-20 NOTE — Progress Notes (Signed)
 Subjective:  Patient ID: Allean LITTIE Ellen, female    DOB: 1970-12-23,  MRN: [id number]  Allean LITTIE Ellen presents to clinic today for at risk foot care with h/o clotting disorder and corn(s) distal tip of left 2nd toe and distal tip of right 3rd toe, callus(es) right heel and submet head 1 right foot and painful elongated, discolored, dystrophic nails.  Pain interferes with ambulation. Aggravating factors include wearing enclosed shoe gear. Painful toenails interfere with ambulation. Aggravating factors include wearing enclosed shoe gear. Pain is relieved with periodic professional debridement. Painful corns and calluses are aggravated when weightbearing with and without shoegear. Pain is relieved with periodic professional debridement. Patient is from group home and is accompanied by caregiver, Miss Alcora. Patient verbalizes pain of toes where she has distal corns. They hurt. Please don't hurt me. Miss Alcora states patient has been complaining of pain when weightbearing with and without shoe gear. She has h/o DVT. Chief Complaint  Patient presents with   Callouses    Painful callouses on her toes. She saw Dr. Marylynn in June    PCP is Marylynn Verneita LITTIE, MD.  Allergies  Allergen Reactions   Erythromycin Shortness Of Breath, Rash and Other (See Comments)    Reaction:  Unknown     Oxycontin  [Oxycodone ]    Tramadol  Nausea And Vomiting   Albuterol  Rash and Other (See Comments)    Jittery     Review of Systems: Negative except as noted in the HPI.  Objective: No changes noted in today's physical examination. There were no vitals filed for this visit. SUNSET JOSHI is a pleasant 53 y.o. female WD, WN in NAD. AAO x 3.  Vascular Examination: Capillary refill time immediate b/l. Vascular status intact b/l with palpable pedal pulses. Pedal hair present b/l. No pain with calf compression b/l. Skin temperature gradient WNL b/l. No cyanosis or clubbing b/l. No ischemia or gangrene noted  b/l.   Neurological Examination: Sensation grossly intact b/l with 10 gram monofilament.  Dermatological Examination: Pedal skin with normal turgor, texture and tone b/l.  No open wounds. No interdigital macerations.   Toenails 1-5 b/l thick, discolored, elongated with subungual debris and pain on dorsal palpation.   Hyperkeratotic lesion(s) plantar heel pad of right foot and submet head 1 right foot.  No erythema, no edema, no drainage, no fluctuance. Preulcerative lesion noted distal tip of right 3rd toe. There is visible subdermal hemorrhage. There is no surrounding erythema, no edema, no drainage, no odor, no fluctuance.   Preulcerative lesion distal tip of left 3rd digit with erythema distal to IPJ. No underlying fluctuance, but it is tender to palpation.  Musculoskeletal Examination: Muscle strength 5/5 to all lower extremity muscle groups bilaterally. HAV with bunion deformity noted b/l LE. Hammertoe(s) 2-5 b/l.SABRA No pain, crepitus or joint limitation noted with ROM b/l LE.  Patient ambulates independently without assistive aids. Pes planus deformity noted left lower extremity.  Radiographs: None  Last A1c:      Latest Ref Rng & Units 08/22/2023   10:27 AM  Hemoglobin A1C  Hemoglobin-A1c 4.6 - 6.5 % 5.7    Assessment/Plan: 1. Pre-ulcerative corn or callous   2. Cellulitis of second toe, left   3. Pain in both feet   4. Clotting disorder     Meds ordered this encounter  Medications   doxycycline  (VIBRAMYCIN ) 100 MG capsule    Sig: Take 1 capsule (100 mg total) by mouth 2 (two) times daily for 10 days.  Dispense:  20 capsule    Refill:  0    -Caregiver/provider present with patient on today's visit. Miss Alcora is present with patient on today's visit.  -Consent given for treatment as described below: -Examined patient. -Discussed need for antibiotic coverage for left 2nd toe. See below for Rx info. -Patient to continue soft, supportive shoe gear daily. -Callus(es)  plantar heel pad of right foot and submet head 1 right foot pared utilizing rotary bur without complication or incident. Total number pared =2. -Preulcerative lesion pared distal tip of left 2nd toe and distal tip of right 3rd toe utilizing sterile scalpel blade. Total number pared=2. -As a courtesy, mycotic toenails 1-5 b/l were debrided in length and girth with sterile nail nippers and dremel file without incident. -Rx for Doxycyline 100 mg, #20, to be taken one capsule twice daily for 10 days. -Patient scheduled to see Dr. Juliene Medicine in 2 weeks for follow up of left 2nd digit. -Dispensed toe crests for both feet. Apply to L 3rd toe and R 3rd toe every morning. Remove every evening. -Patient/POA to call should there be question/concern in the interim.   Return in about 2 weeks (around 05/04/2024) with Dr. Medicine to check left 2nd digit.SABRA Delon LITTIE Gaynel, DPM      [place] LOCATION: [address], [place] [address]                   Office [phone]   [facility] LOCATION: [address] [place], [place] [address] Office [phone]

## 2024-05-03 ENCOUNTER — Ambulatory Visit: Admitting: Internal Medicine

## 2024-05-03 ENCOUNTER — Telehealth: Payer: Self-pay

## 2024-05-03 ENCOUNTER — Encounter: Payer: Self-pay | Admitting: Internal Medicine

## 2024-05-03 VITALS — BP 110/62 | HR 49 | Ht <= 58 in | Wt 125.2 lb

## 2024-05-03 DIAGNOSIS — K5903 Drug induced constipation: Secondary | ICD-10-CM

## 2024-05-03 DIAGNOSIS — T50905A Adverse effect of unspecified drugs, medicaments and biological substances, initial encounter: Secondary | ICD-10-CM | POA: Diagnosis not present

## 2024-05-03 DIAGNOSIS — M818 Other osteoporosis without current pathological fracture: Secondary | ICD-10-CM

## 2024-05-03 DIAGNOSIS — M79644 Pain in right finger(s): Secondary | ICD-10-CM | POA: Diagnosis not present

## 2024-05-03 DIAGNOSIS — K5190 Ulcerative colitis, unspecified, without complications: Secondary | ICD-10-CM | POA: Diagnosis not present

## 2024-05-03 DIAGNOSIS — Z79899 Other long term (current) drug therapy: Secondary | ICD-10-CM | POA: Diagnosis not present

## 2024-05-03 DIAGNOSIS — I6930 Unspecified sequelae of cerebral infarction: Secondary | ICD-10-CM

## 2024-05-03 DIAGNOSIS — M06042 Rheumatoid arthritis without rheumatoid factor, left hand: Secondary | ICD-10-CM | POA: Diagnosis not present

## 2024-05-03 DIAGNOSIS — R519 Headache, unspecified: Secondary | ICD-10-CM

## 2024-05-03 DIAGNOSIS — J4541 Moderate persistent asthma with (acute) exacerbation: Secondary | ICD-10-CM

## 2024-05-03 MED ORDER — ACETAMINOPHEN 500 MG PO TABS: 1000.0000 mg | ORAL_TABLET | Freq: Two times a day (BID) | ORAL | 11 refills | Status: AC

## 2024-05-03 MED ORDER — BENEFIBER DUAL ACTION BIOTIC PO PACK: 1.0000 | PACK | Freq: Every day | ORAL | 11 refills | Status: AC | PRN

## 2024-05-03 MED ORDER — POLYETHYLENE GLYCOL 3350 17 GM/SCOOP PO POWD: 17.0000 g | ORAL | 11 refills | Status: AC

## 2024-05-03 MED ORDER — DICLOFENAC SODIUM 1 % EX GEL: 2.0000 g | Freq: Four times a day (QID) | CUTANEOUS | 11 refills | Status: AC

## 2024-05-03 NOTE — Telephone Encounter (Signed)
 Copied from CRM #[id number]. Topic: Clinical - Medication Question >> May 03, 2024 11:43 AM Rea ORN wrote: Reason for CRM: [facility] with [facility] called to get clarification 2 rx's.  For Tylenol  500 mg, pt was previously taking it for every 8 hours and they received new rx for every 12 hours. Please advise if this change is correct  For the Miralax , pt was taking once daily and new rx is every other day. Please advise if this change is correct.   Please call back [phone]

## 2024-05-03 NOTE — Progress Notes (Signed)
 Subjective:  Patient ID: Allean LITTIE Ellen, female    DOB: 17-Jun-1970  Age: 53 y.o. MRN: [id number]  CC: The primary encounter diagnosis was Drug-induced osteoporosis. 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   HPI ONETHA GAFFEY presents for  Chief Complaint  Patient presents with   Medical Management of Chronic Issues    6 month follow up    1) asthma: has been quiescent.   2) RA: seeing August by [facility] Rheum for S.ERONEGATIVE NONEROSIVE INFLAMMATORY ARTHRITIS:  told that her  right hand pain is likely due to degenerative changes  rather than inflammation. She is taking methotrexate  weekly and low-dose prednisone  as prescribed. Has been afvised to Apply diclofenac  sodium gel to the affected area three to four times daily; however the order was written prn so she has not been receiving it.  SABRA  3) indeterminate colitis: was prescribed  Lialda  (mesalamine )  per Dr Unk , getting it twice daily per [facility] review. Symptoms have improved.  Constipation has returned.   4) Chronic constipation: was addressed with daily miralax ,  now having loose stools.     Outpatient Medications Prior to Visit  Medication Sig Dispense Refill   ADVAIR HFA 230-21 MCG/ACT inhaler INHALE 2 PUFFS INTO LUNGS TWICE DAILY (BRAND PER INS) 12 g 11   ASPIRIN  EC ADULT LOW DOSE 81 MG tablet TAKE 1 TABLET BY MOUTH ONCE DAILY 3 tablet 10   atorvastatin  (LIPITOR) 20 MG tablet Take 1 tablet (20 mg total) by mouth daily. 30 tablet 11   Ayr Saline Nasal No-Drip GEL INSTILL INTO NOSE TWICE DAILY TO MOISTURIZE  LINING OF NOSE;INSTILL INTO NOSE TWICE A DAY AS NEEDED FOR DRY NOSE 22 mL 11   azelastine  (OPTIVAR ) 0.05 % ophthalmic solution PLACE 1 DROP EACH EYE TWICE DAILY *WAIT 3-5 MINUTES BETWEEN 2 EYE MEDS* *STORE UPRIGHT* 6 mL 5   BLUE GEL 2 % GEL APPLY TOPICALLY TO PAINFUL JOINTS 2 TO 3 TIMES DAILY AS NEEDED 226.8 g 11   Calcium  Carb-Cholecalciferol  (OYSTER SHELL CALCIUM  W/D) 500-5 MG-MCG TABS TAKE 1 TABLET  BY MOUTH ONCE DAILY FOR SUPPLEMENT 7 tablet 10   chlorhexidine  (PERIDEX ) 0.12 % solution Use as directed 15 mLs in the mouth or throat 2 (two) times daily.     clotrimazole -betamethasone  (LOTRISONE ) cream APPLY TOPICALLY 2 TIMES PER DAY 45 g 2   cyanocobalamin  (VITAMIN B12) 1000 MCG/ML injection INJECT 1ML (1000MCG) INTRAMUSCULARLY EVERY MONTH ON THE 30TH 1 mL 10   DELSYM  30 MG/5ML liquid TAKE 1 TEASPOONFUL BY MOUTH FOUR TIMES A DAY AS NEEDED *SHAKE WELL* 89 mL 4   diazepam  (VALIUM ) 5 MG tablet Take 1 tablet (5 mg total) by mouth every 12 (twelve) hours as needed for anxiety. 10 tablet 0   docusate sodium  (COLACE) 100 MG capsule TAKE 1 CAPSULE BY MOUTH EVERY OTHER DAY *DO NOT CRUSH OR CHEW* 1 capsule 10   fluticasone  (FLONASE) 50 MCG/ACT nasal spray USE 2 SPRAYS EACH NOSTRIL ONCE DAILY *SHAKE GENTLY* 16 g 2   folic acid  (FOLVITE ) 1 MG tablet TAKE 1 TABLET BY MOUTH EVERY DAY 30 tablet 2   gabapentin  (NEURONTIN ) 300 MG capsule Take 300 mg by mouth 2 (two) times daily.  gentamicin  ointment (GARAMYCIN ) 0.1 % Apply topically.     GUAIATUSSIN AC 100-10 MG/5ML syrup TAKE BY MOUTH THREE TIMES A DAY AS NEEDED FOR COUGH 180 mL 4   ipratropium (ATROVENT ) 0.02 % nebulizer solution INHALE 1 VIAL VIA NEBULIZER FOUR TIMES A DAY AS NEEDED FOR WHEEZING/COUGH 150 mL 11   levalbuterol  (XOPENEX ) 0.63 MG/3ML nebulizer solution Take 3 mLs (0.63 mg total) by nebulization every 8 (eight) hours as needed for wheezing or shortness of breath. 180 mL 11   levothyroxine  (SYNTHROID ) 125 MCG tablet @@TK  1 TABLET BY MOUTH  ONCE DAILY 90 tablet 1   mesalamine  (LIALDA ) 1.2 g EC tablet Take 2.4 g by mouth daily with breakfast.     methotrexate  (RHEUMATREX) 2.5 MG tablet TAKE 7 TABLETS (17.5 MG) BY MOUTH ONCE A WEEK ON FRIDAY. CAUTION: CHEMOTHERAPY. PROTECT FROM LIGHT. NOTE DOSE AND FREQUENCY.   HAZARDOUS DRUG: WEAR GLOVES. DO NOT CFTAKE 7 TABLETS (17.5 MG) BY MOUTH ONCE A WEEK ON FRIDAY. CAUTION: CHEMOTHERAPY. PROTECT FROM LIGHT. NOTE DOSE AND FREQUENCY.   HAZARDOUS DRUG: WEAR GLOVES. DO NOT CRUSH 7 tablet 0   mometasone  (ELOCON ) 0.1 % cream APPLY TWO TIMES DAILY TO LEFT OUTER EAR FOR ECZEMA 15 g 1   montelukast  (SINGULAIR ) 10 MG tablet Take 1 tablet (10 mg total) by mouth at bedtime. 30 tablet 11   Multiple Vitamins-Minerals (THERA-TABS M) TABS TAKE 1 TABLET BY MOUTH ONCE DAILY  FOR SUPPLEMENT *TAKE WITH FOOD* 7 tablet 10   omeprazole  (PRILOSEC) 20 MG capsule TAKE 1 CAPSULE BY MOUTH TWICE DAILY *DO NOT CRUSH OR CHEW* 60 capsule 11   predniSONE  (DELTASONE ) 2.5 MG tablet TAKE 1 TABLET BY MOUTH EACH DAY 30 tablet 11   PROLIA  60 MG/ML SOSY injection Inject 60 mg into the skin every 6 (six) months. 180 mL 2   SENNA-TIME 8.6 MG tablet TAKE 2 TABLETS BY MOUTH AT BEDTIME 14 tablet 10   sertraline  (ZOLOFT ) 25 MG tablet Take 1 tablet (25 mg total) by mouth daily. 7 tablet 10   SF 5000 PLUS 1.1 % CREA dental cream BRUSH NORMAL ONCE DAILY. APPLY PEA SIZE AMOUNT AND ALLOW TO SIT 1 MINUTE. DO NOT EAT, DRINK OR RINSE. SPIT OUT EXCESS 51 g 0   SYRINGE-NEEDLE, DISP, 3 ML (BD ECLIPSE SYRINGE) 25G X 1 3 ML MISC FOR USE WITH CYANOCOBALAMIN  1 each 11   acetaminophen  (TYLENOL ) 650 MG CR tablet TAKE 1 TABLET BY MOUTH EVERY EIGHT HOURS 12 tablet 10   Acetaminophen  Extra Strength 500 MG TABS TAKE 2 TABLETS =1000MG  BY MOUTH EVERY EIGHT HOURS *DO NOT EXCEED 4GM OF TYLENOL  IN 24 HOURS* 14 tablet 10   ARTHRITIS PAIN RELIEVER 1 % GEL APPLY 2 G TOPICALLY 4 (FOUR) TIMES DAILY AS NEEDED FOR JOINT PAIN. 150 g 11   amoxicillin  (AMOXIL ) 500 MG capsule  TAKE 4 CAPSULES (2000MG ) BY MOUTH AT ONCE 1-HR PRIOR TO DENTAL APPT (Patient not taking: Reported on 05/03/2024) 4 capsule 10   No facility-administered medications prior to visit.    Review of Systems;  Patient denies headache, fevers, malaise, unintentional weight loss, skin rash, eye pain, sinus congestion and sinus pain, sore throat, dysphagia,  hemoptysis , cough, dyspnea, wheezing, chest pain, palpitations, orthopnea, edema, abdominal pain, nausea, melena, diarrhea, constipation, flank pain, dysuria, hematuria, urinary  Frequency, nocturia, numbness, tingling, seizures,  Focal weakness, Loss of consciousness,  Tremor, insomnia, depression, anxiety, and suicidal ideation.      Objective:  BP 110/62   Pulse (!) 49  Ht 4' 8 (1.422 m)   Wt 125 lb 3.2 oz (56.8 kg)   LMP 04/11/1981   SpO2 96%   BMI 28.07 kg/m   BP Readings from Last 3 Encounters:  05/03/24 110/62  11/02/23 122/64  10/17/23 100/76    Wt Readings from Last 3 Encounters:  05/03/24 125 lb 3.2 oz (56.8 kg)  11/02/23 131 lb 9.6 oz (59.7 kg)  10/17/23 133 lb 3.2 oz (60.4 kg)    Physical Exam Vitals reviewed.  Constitutional:      General: She is not in acute distress.    Appearance: Normal appearance. She is normal weight. She is not ill-appearing, toxic-appearing or diaphoretic.  HENT:     Head: Normocephalic.  Eyes:     General: No scleral icterus.       Right eye: No discharge.        Left eye: No discharge.     Conjunctiva/sclera: Conjunctivae normal.  Cardiovascular:     Rate and Rhythm: Normal rate and regular rhythm.     Heart sounds: Normal heart sounds.  Pulmonary:     Effort: Pulmonary effort is normal. No respiratory distress.     Breath sounds: Normal breath sounds.  Musculoskeletal:        General: Normal range of motion.  Skin:    General: Skin is warm and dry.  Neurological:     General: No focal deficit present.     Mental Status: She is alert and oriented to person, place, and time.  Mental status is at baseline.  Psychiatric:        Mood and Affect: Mood normal.        Behavior: Behavior normal.        Thought Content: Thought content normal.        Judgment: Judgment normal.     Lab Results  Component Value Date   HGBA1C 5.7 08/22/2023   HGBA1C 5.6 02/21/2023   HGBA1C 5.4 07/06/2022    Lab Results  Component Value Date   CREATININE 0.95 10/17/2023   CREATININE 0.85 08/22/2023   CREATININE 0.92 02/21/2023    Lab Results  Component Value Date   WBC 6.1 10/17/2023   HGB 12.1 10/17/2023   HCT 35.5 (L) 10/17/2023   PLT 296.0 10/17/2023   GLUCOSE 116 (H) 10/17/2023   CHOL 121 08/22/2023   TRIG 111.0 08/22/2023   HDL 46.60 08/22/2023   LDLDIRECT 55.0 08/22/2023   LDLCALC 52 08/22/2023   ALT 19 08/22/2023   AST 33 08/22/2023   NA 141 10/17/2023   K 4.8 10/17/2023   CL 104 10/17/2023   CREATININE 0.95 10/17/2023   BUN 19 10/17/2023   CO2 30 10/17/2023   TSH 7.04 (H) 10/17/2023   INR 0.95 07/28/2017   HGBA1C 5.7 08/22/2023   MICROALBUR <0.7 08/22/2023    MR ENTERO ABDOMEN W WO CONTRAST Result Date: 02/01/2024 CLINICAL DATA:  Lower abdominal pain, constipation and rectal bleeding EXAM: MR ABDOMEN AND PELVIS WITHOUT AND WITH CONTRAST (MR ENTEROGRAPHY) TECHNIQUE: Multiplanar, multisequence MRI of the abdomen and pelvis was performed both before and during bolus administration of intravenous contrast. Negative oral contrast VoLumen was given. CONTRAST:  6mL GADAVIST  GADOBUTROL  1 MMOL/ML IV SOLN COMPARISON:  CT enterography, 01/18/2024 FINDINGS: COMBINED FINDINGS FOR BOTH MR ABDOMEN AND PELVIS Examination is generally limited by breath and peristaltic bowel motion artifact, particularly multiphasic contrast enhanced sequences. Lower chest: No acute abnormality. Hepatobiliary: No solid liver abnormality is seen. No gallstones, gallbladder wall thickening, or biliary dilatation.  Pancreas: Unremarkable. No pancreatic ductal dilatation or surrounding inflammatory  changes. Spleen: Normal in size without significant abnormality. Adrenals/Urinary Tract: Adrenal glands are unremarkable. Kidneys are normal, without renal calculi, solid lesion, or hydronephrosis. Bladder is unremarkable. Stomach/Bowel: Stomach is within normal limits. Appendix not clearly visualized. Small bowel is moderately distended by negative enteric contrast. No evidence of bowel wall thickening, distention, or inflammatory changes. Specifically, short queried segments of mid small bowel in the ventral abdomen are not corroborated on today's examination and there is no abnormal mucosal hyperenhancement or narrowing of the small bowel. Moderate burden of stool in the colon and rectum. Sigmoid diverticulosis. Vascular/Lymphatic: No significant vascular findings are present. No enlarged abdominal or pelvic lymph nodes. Reproductive: Hysterectomy. Other: No abdominal wall hernia or abnormality. No ascites. Musculoskeletal: No acute or significant osseous findings. IMPRESSION: 1. Examination is generally limited by breath and peristaltic bowel motion artifact, particularly multiphasic contrast enhanced sequences. 2. Within this limitation, no evidence of bowel wall thickening, distention, or inflammatory changes. Specifically, short queried segments of mid small bowel in the ventral abdomen are not corroborated on today's examination and there is no abnormal mucosal hyperenhancement or narrowing of the small bowel. 3. Moderate burden of stool in the colon and rectum. Sigmoid diverticulosis without evidence of acute diverticulitis. 4. Hysterectomy. Electronically Signed   By: Marolyn JONETTA Jaksch M.D.   On: 02/01/2024 07:27   MR ENTERO PELVIS W WO CONTRAST Result Date: 02/01/2024 CLINICAL DATA:  Lower abdominal pain, constipation and rectal bleeding EXAM: MR ABDOMEN AND PELVIS WITHOUT AND WITH CONTRAST (MR ENTEROGRAPHY) TECHNIQUE: Multiplanar, multisequence MRI of the abdomen and pelvis was performed both before and  during bolus administration of intravenous contrast. Negative oral contrast VoLumen was given. CONTRAST:  6mL GADAVIST  GADOBUTROL  1 MMOL/ML IV SOLN COMPARISON:  CT enterography, 01/18/2024 FINDINGS: COMBINED FINDINGS FOR BOTH MR ABDOMEN AND PELVIS Examination is generally limited by breath and peristaltic bowel motion artifact, particularly multiphasic contrast enhanced sequences. Lower chest: No acute abnormality. Hepatobiliary: No solid liver abnormality is seen. No gallstones, gallbladder wall thickening, or biliary dilatation. Pancreas: Unremarkable. No pancreatic ductal dilatation or surrounding inflammatory changes. Spleen: Normal in size without significant abnormality. Adrenals/Urinary Tract: Adrenal glands are unremarkable. Kidneys are normal, without renal calculi, solid lesion, or hydronephrosis. Bladder is unremarkable. Stomach/Bowel: Stomach is within normal limits. Appendix not clearly visualized. Small bowel is moderately distended by negative enteric contrast. No evidence of bowel wall thickening, distention, or inflammatory changes. Specifically, short queried segments of mid small bowel in the ventral abdomen are not corroborated on today's examination and there is no abnormal mucosal hyperenhancement or narrowing of the small bowel. Moderate burden of stool in the colon and rectum. Sigmoid diverticulosis. Vascular/Lymphatic: No significant vascular findings are present. No enlarged abdominal or pelvic lymph nodes. Reproductive: Hysterectomy. Other: No abdominal wall hernia or abnormality. No ascites. Musculoskeletal: No acute or significant osseous findings. IMPRESSION: 1. Examination is generally limited by breath and peristaltic bowel motion artifact, particularly multiphasic contrast enhanced sequences. 2. Within this limitation, no evidence of bowel wall thickening, distention, or inflammatory changes. Specifically, short queried segments of mid small bowel in the ventral abdomen are not  corroborated on today's examination and there is no abnormal mucosal hyperenhancement or narrowing of the small bowel. 3. Moderate burden of stool in the colon and rectum. Sigmoid diverticulosis without evidence of acute diverticulitis. 4. Hysterectomy. Electronically Signed   By: Marolyn JONETTA Jaksch M.D.   On: 02/01/2024 07:27    Assessment & Plan:  .Drug-induced osteoporosis Assessment &  Plan: Last Prolia  injection was January 26 2024 per facility   Intractable headache, unspecified chronicity pattern, unspecified headache type  Thumb pain, right Assessment & Plan: Chronic, secondary to OA per Rheumatology. Agree with opinion based on exam of hands today which show no active synovitis.  Advised facility to schedule voltaren  gel every 6 hours and change tylenol  to 1000 mg every 12 hours.    Moderate persistent reactive airway disease with acute exacerbation Assessment & Plan: Continue advair HFA 230 for insurance purposes . 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 (H) 10/17/2023   PLT 296.0 10/17/2023   Lab Results  Component Value Date   ALT 19 08/22/2023   AST 33 08/22/2023   ALKPHOS 55 08/22/2023   BILITOT 0.3 08/22/2023      History of CVA with residual deficit Assessment & Plan: Continues to require use of rolling walker due to dizziness. . Continue asa daily    Long-term use of high-risk medication Assessment & Plan: CBC and CMET are checka quarterly due to use of methotrexate  and were done in August at [facility]    Chronic ulcerative colitis without complication, unspecified location [facility]) Assessment & Plan: Managed with Lialda  taken twice daily per recent GI eval.     Drug-induced  constipation Assessment & Plan: Reducing miralax  to every other day due to loose stools with daily use   Other orders -     Diclofenac  Sodium; Apply 2 g topically 4 (four) times daily. To right thumb base  Dispense: 240 g; Refill: 11 -     Benefiber Dual Action Biotic; Take 1 packet by mouth daily as needed (loose stools).  Dispense: 18 each; Refill: 11 -     Acetaminophen ; Take 2 tablets (1,000 mg total) by mouth every 12 (twelve) hours.  Dispense: 120 tablet; Refill: 11 -     Polyethylene Glycol 3350 ; Take 17 g by mouth every other day. Dissolve 1 capful (17g) in 4-8 ounces of liquid and take by mouth daily.  Dispense: 255 g; Refill: 11   I personally spent greater than 30 minutes  in the care of the patient today including getting/reviewing separately obtained history, performing a medically appropriate exam/evaluation, counseling and educating, placing orders, documenting clinical information in the EHR, independently interpreting results, and communicating results  .  Follow-up: No follow-ups on file.   Verneita LITTIE Kettering, MD

## 2024-05-03 NOTE — Telephone Encounter (Signed)
 FYI

## 2024-05-03 NOTE — Patient Instructions (Addendum)
 1) THE THUMB JOINT PAI IS DUE TO OSTEOARTHRITIS .  Please start applying voltaren  gel to Cindy Oconnor's thumb at the base 4 TIMES DAILY    This was recommended by [facility] Rheumatologist  Change the acetaminophen    to 1000 mg every 12 hours   2)  FOR THE BOWEL ISSUES:   reduce miralax  to EVERY OTHER DAY   TO STOP THE LOOSE STOOL  CONTINUE MESALAMINE   TWICE DAILY   IF STOOLS REMAIN LOOSE,  ADD BENEFIBER DAILY

## 2024-05-03 NOTE — Telephone Encounter (Signed)
 Copied from CRM #[id number]. Topic: General - Other >> May 03, 2024  3:51 PM Rosina BIRCH wrote: Reason for CRM: carissa from [facility] called stating the doctor wanted to know when the last time the patient had her prolia  shot. The last one was 8/28 and she is not due again until 2/28 The patient was seen today.

## 2024-05-04 DIAGNOSIS — M79644 Pain in right finger(s): Secondary | ICD-10-CM | POA: Insufficient documentation

## 2024-05-04 DIAGNOSIS — K5900 Constipation, unspecified: Secondary | ICD-10-CM | POA: Insufficient documentation

## 2024-05-04 NOTE — Assessment & Plan Note (Signed)
 Chronic, secondary to OA per Rheumatology. Agree with opinion based on exam of hands today which show no active synovitis.  Advised facility to schedule voltaren  gel every 6 hours and change tylenol  to 1000 mg every 12 hours.

## 2024-05-04 NOTE — Assessment & Plan Note (Signed)
 Managed with ethotrexate and low dose prednisone  by [facility] rheum  Lab Results  Component Value Date   WBC 6.1 10/17/2023   HGB 12.1 10/17/2023   HCT 35.5 (L) 10/17/2023   MCV 110.4 Repeated and verified X2. (H) 10/17/2023   PLT 296.0 10/17/2023   Lab Results  Component Value Date   ALT 19 08/22/2023   AST 33 08/22/2023   ALKPHOS 55 08/22/2023   BILITOT 0.3 08/22/2023

## 2024-05-04 NOTE — Assessment & Plan Note (Signed)
 Last Prolia  injection was January 26 2024 per facility

## 2024-05-04 NOTE — Assessment & Plan Note (Signed)
 Reducing miralax  to every other day due to loose stools with daily use

## 2024-05-04 NOTE — Assessment & Plan Note (Signed)
 Managed with Lialda  taken twice daily per recent GI eval.

## 2024-05-04 NOTE — Assessment & Plan Note (Addendum)
 CBC and CMET are checka quarterly due to use of methotrexate  and were done in August at [facility]

## 2024-05-04 NOTE — Assessment & Plan Note (Signed)
 Continues to require use of rolling walker due to dizziness. . Continue asa daily

## 2024-05-04 NOTE — Assessment & Plan Note (Signed)
 Continue advair HFA 230 for insurance purposes . She has had no recent exacerbations

## 2024-05-04 NOTE — Telephone Encounter (Signed)
 Spoke with [facility] to let her know that the changes are correct.

## 2024-05-09 ENCOUNTER — Ambulatory Visit: Admitting: Podiatry

## 2024-05-14 ENCOUNTER — Other Ambulatory Visit: Payer: Self-pay | Admitting: Internal Medicine

## 2024-05-15 ENCOUNTER — Other Ambulatory Visit: Payer: Self-pay | Admitting: Internal Medicine

## 2024-05-17 ENCOUNTER — Ambulatory Visit: Payer: Self-pay

## 2024-05-17 ENCOUNTER — Telehealth: Payer: Self-pay | Admitting: Internal Medicine

## 2024-05-17 NOTE — Telephone Encounter (Signed)
 Noted

## 2024-05-17 NOTE — Telephone Encounter (Signed)
 2nd attempt to contact legal guardian per chart. No answer. Voicemail left to call back to Nurse Triage. Per chart review, order for amoxicillin  was sent to pharmacy on 12/16. Will place in call backs.

## 2024-05-17 NOTE — Telephone Encounter (Unsigned)
 Copied from CRM #[id number]. Topic: Clinical - Medication Refill >> May 17, 2024 11:04 AM Vena HERO wrote: Medication: amoxicillin  (AMOXIL ) 500 MG capsule  Has the patient contacted their pharmacy? No (Agent: If no, request that the patient contact the pharmacy for the refill. If patient does not wish to contact the pharmacy document the reason why and proceed with request.) (Agent: If yes, when and what did the pharmacy advise?)  This is the patient's preferred pharmacy:  [facility] - [place], [place] - [address] [place] [address] Phone: [phone] Fax: [phone]  Is this the correct pharmacy for this prescription? Yes If no, delete pharmacy and type the correct one.   Has the prescription been filled recently? No  Is the patient out of the medication? Yes  Has the patient been seen for an appointment in the last year OR does the patient have an upcoming appointment? Yes  Can we respond through MyChart? No, call preferred to legal guardian at [phone]  Agent: Please be advised that Rx refills may take up to 3 business days. We ask that you follow-up with your pharmacy.

## 2024-05-17 NOTE — Telephone Encounter (Unsigned)
 Copied from CRM [id number]. Topic: Clinical - Medication Question >> May 17, 2024 10:57 AM [name] wrote: Reason for CRM: Cindy Oconnor w/ [facility] called in stating patient has a dentist appointment but before she can have appointment she needs the antibiotic for her gums. The dentist prescribe the antibiotics and advise guardian PCP will have to do so. Patient needs before 1/7

## 2024-05-17 NOTE — Telephone Encounter (Signed)
 3rd attempt to contact legal guardian. No answer-left voicemail.

## 2024-05-17 NOTE — Telephone Encounter (Signed)
 Legal Guardian called to request antibiotics for an upcoming dentist appointment. Call placed to legal guardian-no answer. Voicemail left to call back to Nurse Triage. Placed in call backs.

## 2024-05-17 NOTE — Telephone Encounter (Signed)
 Answer Assessment - Initial Assessment Questions 1. NAME of MEDICINE: What medicine(s) are you calling about?     Amoxicillin  Spoke with CG, Alcora James regarding request for antibiotic. Reports patient was unable to have dental procedure last Thursday, due to not having antibiotic.  Reports next dental appt is 06/25/24. Reports patient is to take antibiotic 1 hour to dental appt.   Per chart review, amoxicillin  was sent to pharmacy 05/15/24, informed caregiver and read instructions. Pt's CG verbalized understanding.  CG denies any symptoms or concerns at this time. No triage.  Advised call back if: symptoms occur, fever, chills/n/v, redness, swelling, pain gums/ mouth or any concerns.  Pt's CG verbalized understanding.  Protocols used: Medication Question Call-A-AH

## 2024-05-18 NOTE — Telephone Encounter (Signed)
See message routed to Dr. Derrel Nip

## 2024-05-18 NOTE — Telephone Encounter (Signed)
 Spoke with Cindy Oconnor and she stated that they received the antibiotic.

## 2024-05-21 ENCOUNTER — Other Ambulatory Visit: Payer: Self-pay | Admitting: Internal Medicine

## 2024-05-22 ENCOUNTER — Other Ambulatory Visit: Payer: Self-pay

## 2024-05-22 NOTE — Telephone Encounter (Signed)
 Historical provider.  Received a message from pharmacy stating that [name], Cindy Oconnor has plenty of tablets on her previous prescription, the SIG was written for 90 days, which discontinues it this month. Please send new rx to continue therapy. Thank you, NMG   Last OV: 05/03/2024 Next OV: 07/06/2024

## 2024-05-25 NOTE — Telephone Encounter (Signed)
 Pharmacy is aware and stated that they would send to Dr. Kayla office.

## 2024-05-30 ENCOUNTER — Other Ambulatory Visit: Payer: Self-pay | Admitting: Internal Medicine

## 2024-06-04 ENCOUNTER — Other Ambulatory Visit: Payer: Self-pay | Admitting: Internal Medicine

## 2024-06-06 NOTE — Telephone Encounter (Signed)
 Refilled: 12/14/2022 Last OV: 05/03/2024 Next OV: 07/06/2024

## 2024-06-07 ENCOUNTER — Ambulatory Visit: Admitting: Podiatry

## 2024-06-13 ENCOUNTER — Ambulatory Visit

## 2024-06-13 DIAGNOSIS — M79671 Pain in right foot: Secondary | ICD-10-CM

## 2024-06-13 DIAGNOSIS — L84 Corns and callosities: Secondary | ICD-10-CM

## 2024-06-13 DIAGNOSIS — L989 Disorder of the skin and subcutaneous tissue, unspecified: Secondary | ICD-10-CM

## 2024-06-13 DIAGNOSIS — B351 Tinea unguium: Secondary | ICD-10-CM

## 2024-06-13 NOTE — Progress Notes (Signed)
 "  Subjective:  Patient ID: Cindy Oconnor, female    DOB: 12/23/1970,  MRN: [id number]  Cindy Oconnor presents to clinic today for at risk foot care with h/o clotting disorder and corn(s) distal tip of left 2nd toe and distal tip of right 3rd toe, callus(es) right heel and submet head 1 right foot and painful elongated, discolored, dystrophic nails.  Pain interferes with ambulation. Aggravating factors include wearing enclosed shoe gear. Painful toenails interfere with ambulation. Aggravating factors include wearing enclosed shoe gear. Pain is relieved with periodic professional debridement. Painful corns and calluses are aggravated when weightbearing with and without shoegear. Pain is relieved with periodic professional debridement. Patient is from group home and is accompanied by caregiver. Patient verbalizes pain of toes where she has distal corns. They hurt. Please don't hurt me. Cindy Oconnor states patient has been complaining of pain when weightbearing with and without shoe gear. She was dispensed toe sleeves at last visit but lost them. She completed the antibiotic and is has not had any further signs of infection. She has h/o DVT.  Chief Complaint  Patient presents with   Nail Problem    Denies being diabetic. She has a callous on her left foot second toe    PCP is Marylynn Verneita LITTIE, MD.  Allergies  Allergen Reactions   Erythromycin Shortness Of Breath, Rash and Other (See Comments)    Reaction:  Unknown     Oxycontin  [Oxycodone ]    Tramadol  Nausea And Vomiting   Albuterol  Rash and Other (See Comments)    Jittery     Review of Systems: Negative except as noted in the HPI.  Objective: No changes noted in today's physical examination. There were no vitals filed for this visit. Cindy Oconnor is a pleasant 54 y.o. female WD, WN in NAD. AAO x 3.  Vascular Examination: Capillary refill time immediate b/l. Vascular status intact b/l with palpable pedal pulses. Pedal  hair present b/l. No pain with calf compression b/l. Skin temperature gradient WNL b/l. No cyanosis or clubbing b/l. No ischemia or gangrene noted b/l.   Neurological Examination: Sensation grossly intact b/l with 10 gram monofilament.  Dermatological Examination: Pedal skin with normal turgor, texture and tone b/l.  No open wounds. No interdigital macerations.   Toenails 1-5 b/l thick, discolored, elongated with subungual debris and pain on dorsal palpation.   Hyperkeratotic lesion(s) plantar heel pad of right foot and submet head 1 right foot.  No erythema, no edema, no drainage, no fluctuance.   Preulcerative lesion noted distal tip of right 3rd toe. There is no visible subdermal hemorrhage. There is no surrounding erythema, no edema, no drainage, no odor, no fluctuance.   Preulcerative lesion distal tip of left 2nd digit without erythema or sign of infection. No underlying fluctuance, but it is tender to palpation.  Musculoskeletal Examination: Muscle strength 5/5 to all lower extremity muscle groups bilaterally. HAV with bunion deformity noted b/l LE. Hammertoe(s) 2-5 b/l.SABRA No pain, crepitus or joint limitation noted with ROM b/l LE.  Patient ambulates independently without assistive aids. 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   Radiographs: None  Last A1c:      Latest Ref Rng & Units 08/22/2023   10:27 AM  Hemoglobin A1C  Hemoglobin-A1c 4.6 - 6.5 % 5.7    Assessment/Plan: 1. Pre-ulcerative corn or callous   2. Pain in both feet   3. Onychomycosis   4. Benign skin lesion  No orders of the defined types were placed in this encounter.   -Caregiver/provider present with patient on today's visit. Cindy Oconnor is present with patient on today's visit.  -Consent given for treatment as described below: -Examined patient. -Preulcerative lesion pared distal tip of left 2nd toe and distal tip of right 3rd toe utilizing  rotary burr on low power. Attempted sharp debridement which patient refused and could not tolerate due to sensitivity. Total number pared=2. -Patient refused debridement of other hyperkeratotic lesions today - Patient may benefit from hammertoe corrective surgery in the future to decrease pressure on distal tuft. Flexor tenotomy would likely not be effective due to rigidity of toe.  -As a courtesy, mycotic toenails 1-5 b/l were debrided in length and girth with sterile nail nippers and dremel file without incident. -Discussed monitoring for infection, they will contact our office or proceed to ED if signs of infection become present -Dispensed toe sleeves for both feet. Apply to L 2nd toe and R 3rd toe every morning. Remove every evening. -Patient/POA to call should there be question/concern in the interim.  - RTC 3 months with Dr. Gaynel, DPM   Prentice JINNY Ovens, DPM      [facility] LOCATION: [address] [place] [address]                   Office [phone]   [facility] LOCATION: [address] [place], [place] [address] Office [phone]  "

## 2024-06-25 ENCOUNTER — Other Ambulatory Visit: Payer: Self-pay | Admitting: Internal Medicine

## 2024-07-06 ENCOUNTER — Ambulatory Visit: Admitting: Internal Medicine

## 2024-07-06 ENCOUNTER — Encounter: Payer: Self-pay | Admitting: Internal Medicine

## 2024-07-06 VITALS — BP 96/50 | HR 65 | Ht <= 58 in | Wt 122.0 lb

## 2024-07-06 DIAGNOSIS — E034 Atrophy of thyroid (acquired): Secondary | ICD-10-CM

## 2024-07-06 DIAGNOSIS — E785 Hyperlipidemia, unspecified: Secondary | ICD-10-CM

## 2024-07-06 DIAGNOSIS — F411 Generalized anxiety disorder: Secondary | ICD-10-CM

## 2024-07-06 DIAGNOSIS — Z79899 Other long term (current) drug therapy: Secondary | ICD-10-CM

## 2024-07-06 DIAGNOSIS — M818 Other osteoporosis without current pathological fracture: Secondary | ICD-10-CM

## 2024-07-06 DIAGNOSIS — M06042 Rheumatoid arthritis without rheumatoid factor, left hand: Secondary | ICD-10-CM

## 2024-07-06 DIAGNOSIS — K5190 Ulcerative colitis, unspecified, without complications: Secondary | ICD-10-CM

## 2024-07-06 DIAGNOSIS — E0849 Diabetes mellitus due to underlying condition with other diabetic neurological complication: Secondary | ICD-10-CM

## 2024-07-06 DIAGNOSIS — M1380 Other specified arthritis, unspecified site: Secondary | ICD-10-CM

## 2024-07-06 LAB — CBC WITH DIFFERENTIAL/PLATELET
Basophils Absolute: 0 10*3/uL (ref 0.0–0.1)
Basophils Relative: 0.7 % (ref 0.0–3.0)
Eosinophils Absolute: 0.1 10*3/uL (ref 0.0–0.7)
Eosinophils Relative: 0.9 % (ref 0.0–5.0)
HCT: 37.1 % (ref 36.0–46.0)
Hemoglobin: 12.3 g/dL (ref 12.0–15.0)
Lymphocytes Relative: 7.9 % — ABNORMAL LOW (ref 12.0–46.0)
Lymphs Abs: 0.5 10*3/uL — ABNORMAL LOW (ref 0.7–4.0)
MCHC: 33.2 g/dL (ref 30.0–36.0)
MCV: 112.4 fl — ABNORMAL HIGH (ref 78.0–100.0)
Monocytes Absolute: 0.5 10*3/uL (ref 0.1–1.0)
Monocytes Relative: 7 % (ref 3.0–12.0)
Neutro Abs: 5.6 10*3/uL (ref 1.4–7.7)
Neutrophils Relative %: 83.5 % — ABNORMAL HIGH (ref 43.0–77.0)
Platelets: 287 10*3/uL (ref 150.0–400.0)
RBC: 3.3 Mil/uL — ABNORMAL LOW (ref 3.87–5.11)
RDW: 16.2 % — ABNORMAL HIGH (ref 11.5–15.5)
WBC: 6.7 10*3/uL (ref 4.0–10.5)

## 2024-07-06 LAB — COMPREHENSIVE METABOLIC PANEL WITH GFR
ALT: 16 U/L (ref 3–35)
AST: 30 U/L (ref 5–37)
Albumin: 3.9 g/dL (ref 3.5–5.2)
Alkaline Phosphatase: 72 U/L (ref 39–117)
BUN: 13 mg/dL (ref 6–23)
CO2: 32 meq/L (ref 19–32)
Calcium: 9.1 mg/dL (ref 8.4–10.5)
Chloride: 102 meq/L (ref 96–112)
Creatinine, Ser: 0.88 mg/dL (ref 0.40–1.20)
GFR: 73.74 mL/min
Glucose, Bld: 94 mg/dL (ref 70–99)
Potassium: 4.2 meq/L (ref 3.5–5.1)
Sodium: 141 meq/L (ref 135–145)
Total Bilirubin: 0.4 mg/dL (ref 0.2–1.2)
Total Protein: 6.5 g/dL (ref 6.0–8.3)

## 2024-07-06 LAB — LIPID PANEL
Cholesterol: 109 mg/dL (ref 28–200)
HDL: 46.6 mg/dL
LDL Cholesterol: 39 mg/dL (ref 10–99)
NonHDL: 62.13
Total CHOL/HDL Ratio: 2
Triglycerides: 116 mg/dL (ref 10.0–149.0)
VLDL: 23.2 mg/dL (ref 0.0–40.0)

## 2024-07-06 LAB — LDL CHOLESTEROL, DIRECT: Direct LDL: 47 mg/dL

## 2024-07-06 LAB — HEMOGLOBIN A1C: Hgb A1c MFr Bld: 5.6 % (ref 4.6–6.5)

## 2024-07-06 LAB — TSH: TSH: 0.26 u[IU]/mL — ABNORMAL LOW (ref 0.35–5.50)

## 2024-07-06 NOTE — Assessment & Plan Note (Signed)
 Managed with Lialda  taken twice daily per recent GI eval.  In Nov 2025.  She has not had ay heamtochezia lately,  advised to  follow up with GI in March

## 2024-07-06 NOTE — Assessment & Plan Note (Signed)
 Managed with 2.5 mg prednisone  daily,  weekly methotrexate  and folic acid  , and topical voltaren   by [facility] rheum  Lab Results  Component Value Date   WBC 6.1 10/17/2023   HGB 12.1 10/17/2023   HCT 35.5 (L) 10/17/2023   MCV 110.4 Repeated and verified X2. (H) 10/17/2023   PLT 296.0 10/17/2023   Lab Results  Component Value Date   ALT 19 08/22/2023   AST 33 08/22/2023   ALKPHOS 55 08/22/2023   BILITOT 0.3 08/22/2023

## 2024-07-06 NOTE — Progress Notes (Unsigned)
 "  Subjective:  Patient ID: Cindy Oconnor, female    DOB: [birth year]  Age: 55 y.o. MRN: [id number]  CC: The primary encounter diagnosis was Dyslipidemia. 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   HPI Cindy Oconnor presents for  Chief Complaint  Patient presents with   Medical Management of Chronic Issues   Cindy Oconnor is a delightful 54 yr old female with Down's Syndrome, rheumatoid arthritis , asthma,  ulcerative colitis , chronic constipation who is brought in from the group home in which she resides by a staff member for follow up  1) h/o ulcerative colitis:  saw Dr. Unk in November  for rfollow up on U/c and CC with epsidoes of hematochezia.  Was unable to tolerate prep for colonoscopy.  Enterogram done Iin Sept. Suggesting inflammation. She was prescribed mesalamine   2.4 mg twice daily and advised to  follow up 4 months.  2) chronic constipation advised by Dr Unk to continue  use of daily miralax  in apple juice  along with psyllium and sennokot.  Patient states that her stools are moving more easily   3) RA   she is  TAKING methotrexate  AND FOLIC ACID  SEES RHEUM ON MARCH 24.  Today she is reporting persistent RIGHT SHOULDER  with decreased ROM and popping with adduction.  She has  no history of trauma or repetitive use   4) Morbid obesity:  she has lost 18 lbs over the past 12 months by increasing her intake of vegetables  5) Mild intermittent asthma: she denies any recent exacerbations   Outpatient Medications Prior to Visit  Medication Sig Dispense Refill    acetaminophen  (TYLENOL ) 500 MG tablet Take 2 tablets (1,000 mg total) by mouth every 12 (twelve) hours. 120 tablet 11   ADVAIR HFA 230-21 MCG/ACT inhaler INHALE 2 PUFFS INTO LUNGS TWICE DAILY (BRAND PER INS) 12 g 11   amoxicillin  (AMOXIL ) 500 MG capsule TAKE 4 CAPSULES (2000MG ) BY MOUTH AT ONCE 1-HR PRIOR TO DENTAL APPT 4 capsule 11   ASPIRIN  EC ADULT LOW DOSE 81 MG tablet TAKE 1 TABLET BY MOUTH ONCE DAILY 3 tablet 10   atorvastatin  (LIPITOR) 20 MG tablet TAKE 1 TABLET BY MOUTH ONCE DAILY 4 tablet 10   Ayr Saline Nasal No-Drip GEL INSTILL INTO NOSE TWICE DAILY TO MOISTURIZE LINING OF NOSE;INSTILL INTO NOSE TWICE A DAY AS NEEDED FOR DRY NOSE 22 mL 11   azelastine  (OPTIVAR ) 0.05 % ophthalmic solution PLACE 1 DROP EACH EYE TWICE DAILY *WAIT 3-5 MINUTES BETWEEN 2 EYE MEDS* *STORE UPRIGHT* 6 mL 5   Bacillus Coag-Inulin-Theanine (BENEFIBER DUAL ACTION BIOTIC) PACK Take 1 packet by mouth daily as needed (loose stools). 18 each 11   BD ECLIPSE SYRINGE 25G X 1 3 ML MISC FOR USE WITH CYANOCOBALAMIN  1 each 11   BLUE GEL 2 % GEL APPLY TOPICALLY TO PAINFUL JOINTS 2 TO 3 TIMES DAILY AS NEEDED 226.8 g 11   Calcium  Carb-Cholecalciferol  (OYSTER SHELL CALCIUM  W/D) 500-5 MG-MCG TABS TAKE 1 TABLET  BY MOUTH ONCE DAILY FOR SUPPLEMENT 7 tablet 10   chlorhexidine  (PERIDEX ) 0.12 % solution Use as directed 15 mLs in the mouth or throat 2 (two) times daily.  clotrimazole -betamethasone  (LOTRISONE ) cream APPLY TOPICALLY 2 TIMES PER DAY 45 g 2   cyanocobalamin  (VITAMIN B12) 1000 MCG/ML injection INJECT 1ML (1000MCG) INTRAMUSCULARLY EVERY MONTH ON THE 30TH 1 mL 10   DELSYM  30 MG/5ML liquid TAKE 1 TEASPOONFUL BY MOUTH FOUR TIMES A DAY AS NEEDED *SHAKE WELL* 89 mL 4   diazepam  (VALIUM ) 5 MG tablet Take 1 tablet (5 mg total) by mouth every 12 (twelve) hours as needed for anxiety. 10 tablet 0   diclofenac  Sodium (VOLTAREN ) 1 % GEL Apply 2 g topically 4 (four) times daily. To right thumb base 240 g 11   docusate  sodium (COLACE) 100 MG capsule TAKE 1 CAPSULE BY MOUTH EVERY OTHER DAY *DO NOT CRUSH OR CHEW* 1 capsule 10   fluticasone  (FLONASE) 50 MCG/ACT nasal spray USE 2 SPRAYS EACH NOSTRIL ONCE DAILY *SHAKE GENTLY* 16 g 2   folic acid  (FOLVITE ) 1 MG tablet TAKE 1 TABLET BY MOUTH EVERY DAY 30 tablet 2   gabapentin  (NEURONTIN ) 300 MG capsule Take 300 mg by mouth 2 (two) times daily.     gentamicin  ointment (GARAMYCIN ) 0.1 % Apply topically.     GUAIATUSSIN AC 100-10 MG/5ML syrup TAKE BY MOUTH THREE TIMES A DAY AS NEEDED FOR COUGH 180 mL 4   ipratropium (ATROVENT ) 0.02 % nebulizer solution INHALE 1 VIAL VIA NEBULIZER FOUR TIMES A DAY AS NEEDED FOR WHEEZING/COUGH 150 mL 11   levalbuterol  (XOPENEX ) 0.63 MG/3ML nebulizer solution Take 3 mLs (0.63 mg total) by nebulization every 8 (eight) hours as needed for wheezing or shortness of breath. 180 mL 11   levothyroxine  (SYNTHROID ) 125 MCG tablet @@TK  1 TABLET BY MOUTH ONCE DAILY 90 tablet 1   mesalamine  (LIALDA ) 1.2 g EC tablet Take 2.4 g by mouth daily with breakfast.     methotrexate  (RHEUMATREX) 2.5 MG tablet TAKE 7 TABLETS (17.5 MG) BY MOUTH ONCE A WEEK ON FRIDAY. CAUTION: CHEMOTHERAPY. PROTECT FROM LIGHT. NOTE DOSE AND FREQUENCY.   HAZARDOUS DRUG: WEAR GLOVES. DO NOT CFTAKE 7 TABLETS (17.5 MG) BY MOUTH ONCE A WEEK ON FRIDAY. CAUTION: CHEMOTHERAPY. PROTECT FROM LIGHT. NOTE DOSE AND FREQUENCY.   HAZARDOUS DRUG: WEAR GLOVES. DO NOT CRUSH 7 tablet 0   mometasone  (ELOCON ) 0.1 % cream APPLY TWO TIMES DAILY TO LEFT OUTER EAR FOR ECZEMA 15 g 1   montelukast  (SINGULAIR ) 10 MG tablet TAKE 1 TABLET BY MOUTH EVERY NIGHT AT BEDTIME 4 tablet 10   Multiple Vitamins-Minerals (THERA-TABS M) TABS TAKE 1 TABLET BY MOUTH ONCE DAILY  FOR SUPPLEMENT *TAKE WITH FOOD* 7 tablet 10   omeprazole  (PRILOSEC) 20 MG capsule TAKE 1 CAPSULE BY MOUTH TWICE DAILY *DO NOT CRUSH OR CHEW* *TAKE ON AN EMPTY STOMACH* 8 capsule 10   polyethylene glycol powder (GLYCOLAX /MIRALAX ) 17  GM/SCOOP powder Take 17 g by mouth every other day. Dissolve 1 capful (17g) in 4-8 ounces of liquid and take by mouth daily. 255 g 11   predniSONE  (DELTASONE ) 2.5 MG tablet TAKE 1 TABLET BY MOUTH EACH DAY 30 tablet 11   PROLIA  60 MG/ML SOSY injection Inject 60 mg into the skin every 6 (six) months. 180 mL 2   SENNA-TIME 8.6 MG tablet TAKE 2 TABLETS BY MOUTH AT BEDTIME 14 tablet 10   sertraline  (ZOLOFT ) 25 MG tablet TAKE 1 TABLET BY MOUTH ONCE DAILY 4 tablet 10   SF 5000 PLUS 1.1 % CREA dental cream BRUSH NORMAL ONCE DAILY. APPLY PEA SIZE AMOUNT AND ALLOW TO SIT 1 MINUTE. DO NOT EAT, DRINK OR RINSE.  SPIT OUT EXCESS 51 g 0   No facility-administered medications prior to visit.    Review of Systems;  Patient denies headache, fevers, malaise, unintentional weight loss, skin rash, eye pain, sinus congestion and sinus pain, sore throat, dysphagia,  hemoptysis , cough, dyspnea, wheezing, chest pain, palpitations, orthopnea, edema, abdominal pain, nausea, melena, diarrhea,  flank pain, dysuria, hematuria, urinary  Frequency, nocturia, numbness, tingling, seizures,  Focal weakness, Loss of consciousness,  Tremor, insomnia, depression, anxiety, and suicidal ideation.      Objective:  BP (!) 96/50   Pulse 65   Ht 4' 8 (1.422 m)   Wt 122 lb (55.3 kg)   LMP 04/11/1981   SpO2 100%   BMI 27.35 kg/m   BP Readings from Last 3 Encounters:  07/06/24 (!) 96/50  05/03/24 110/62  11/02/23 122/64    Wt Readings from Last 3 Encounters:  07/06/24 122 lb (55.3 kg)  05/03/24 125 lb 3.2 oz (56.8 kg)  11/02/23 131 lb 9.6 oz (59.7 kg)    Physical Exam Vitals reviewed.  Constitutional:      General: She is not in acute distress.    Appearance: Normal appearance. She is normal weight. She is not ill-appearing, toxic-appearing or diaphoretic.  HENT:     Head: Normocephalic.  Eyes:     General: No scleral icterus.       Right eye: No discharge.        Left eye: No discharge.      Conjunctiva/sclera: Conjunctivae normal.  Cardiovascular:     Rate and Rhythm: Normal rate and regular rhythm.     Heart sounds: Normal heart sounds.  Pulmonary:     Effort: Pulmonary effort is normal. No respiratory distress.     Breath sounds: Normal breath sounds.  Musculoskeletal:        General: Tenderness and deformity present.     Right shoulder: Bony tenderness present. No swelling, deformity, effusion or laceration. Decreased range of motion. Normal strength.     Left shoulder: Normal.     Right wrist: No deformity.     Right hand: Deformity present.     Left hand: Deformity present.  Skin:    General: Skin is warm and dry.  Neurological:     General: No focal deficit present.     Mental Status: She is alert and oriented to person, place, and time. Mental status is at baseline.  Psychiatric:        Mood and Affect: Mood normal.        Behavior: Behavior normal.        Thought Content: Thought content normal.        Judgment: Judgment normal.     Lab Results  Component Value Date   HGBA1C 5.7 08/22/2023   HGBA1C 5.6 02/21/2023   HGBA1C 5.4 07/06/2022    Lab Results  Component Value Date   CREATININE 0.95 10/17/2023   CREATININE 0.85 08/22/2023   CREATININE 0.92 02/21/2023    Lab Results  Component Value Date   WBC 6.1 10/17/2023   HGB 12.1 10/17/2023   HCT 35.5 (L) 10/17/2023   PLT 296.0 10/17/2023   GLUCOSE 116 (H) 10/17/2023   CHOL 121 08/22/2023   TRIG 111.0 08/22/2023   HDL 46.60 08/22/2023   LDLDIRECT 55.0 08/22/2023   LDLCALC 52 08/22/2023   ALT 19 08/22/2023   AST 33 08/22/2023   NA 141 10/17/2023   K 4.8 10/17/2023   CL 104 10/17/2023   CREATININE 0.95 10/17/2023  BUN 19 10/17/2023   CO2 30 10/17/2023   TSH 7.04 (H) 10/17/2023   INR 0.95 07/28/2017   HGBA1C 5.7 08/22/2023   MICROALBUR <0.7 08/22/2023    MR ENTERO ABDOMEN W WO CONTRAST Result Date: 02/01/2024 CLINICAL DATA:  Lower abdominal pain, constipation and rectal bleeding EXAM:  MR ABDOMEN AND PELVIS WITHOUT AND WITH CONTRAST (MR ENTEROGRAPHY) TECHNIQUE: Multiplanar, multisequence MRI of the abdomen and pelvis was performed both before and during bolus administration of intravenous contrast. Negative oral contrast VoLumen was given. CONTRAST:  6mL GADAVIST  GADOBUTROL  1 MMOL/ML IV SOLN COMPARISON:  CT enterography, 01/18/2024 FINDINGS: COMBINED FINDINGS FOR BOTH MR ABDOMEN AND PELVIS Examination is generally limited by breath and peristaltic bowel motion artifact, particularly multiphasic contrast enhanced sequences. Lower chest: No acute abnormality. Hepatobiliary: No solid liver abnormality is seen. No gallstones, gallbladder wall thickening, or biliary dilatation. Pancreas: Unremarkable. No pancreatic ductal dilatation or surrounding inflammatory changes. Spleen: Normal in size without significant abnormality. Adrenals/Urinary Tract: Adrenal glands are unremarkable. Kidneys are normal, without renal calculi, solid lesion, or hydronephrosis. Bladder is unremarkable. Stomach/Bowel: Stomach is within normal limits. Appendix not clearly visualized. Small bowel is moderately distended by negative enteric contrast. No evidence of bowel wall thickening, distention, or inflammatory changes. Specifically, short queried segments of mid small bowel in the ventral abdomen are not corroborated on today's examination and there is no abnormal mucosal hyperenhancement or narrowing of the small bowel. Moderate burden of stool in the colon and rectum. Sigmoid diverticulosis. Vascular/Lymphatic: No significant vascular findings are present. No enlarged abdominal or pelvic lymph nodes. Reproductive: Hysterectomy. Other: No abdominal wall hernia or abnormality. No ascites. Musculoskeletal: No acute or significant osseous findings. IMPRESSION: 1. Examination is generally limited by breath and peristaltic bowel motion artifact, particularly multiphasic contrast enhanced sequences. 2. Within this limitation, no  evidence of bowel wall thickening, distention, or inflammatory changes. Specifically, short queried segments of mid small bowel in the ventral abdomen are not corroborated on today's examination and there is no abnormal mucosal hyperenhancement or narrowing of the small bowel. 3. Moderate burden of stool in the colon and rectum. Sigmoid diverticulosis without evidence of acute diverticulitis. 4. Hysterectomy. Electronically Signed   By: Marolyn JONETTA Jaksch M.D.   On: 02/01/2024 07:27   MR ENTERO PELVIS W WO CONTRAST Result Date: 02/01/2024 CLINICAL DATA:  Lower abdominal pain, constipation and rectal bleeding EXAM: MR ABDOMEN AND PELVIS WITHOUT AND WITH CONTRAST (MR ENTEROGRAPHY) TECHNIQUE: Multiplanar, multisequence MRI of the abdomen and pelvis was performed both before and during bolus administration of intravenous contrast. Negative oral contrast VoLumen was given. CONTRAST:  6mL GADAVIST  GADOBUTROL  1 MMOL/ML IV SOLN COMPARISON:  CT enterography, 01/18/2024 FINDINGS: COMBINED FINDINGS FOR BOTH MR ABDOMEN AND PELVIS Examination is generally limited by breath and peristaltic bowel motion artifact, particularly multiphasic contrast enhanced sequences. Lower chest: No acute abnormality. Hepatobiliary: No solid liver abnormality is seen. No gallstones, gallbladder wall thickening, or biliary dilatation. Pancreas: Unremarkable. No pancreatic ductal dilatation or surrounding inflammatory changes. Spleen: Normal in size without significant abnormality. Adrenals/Urinary Tract: Adrenal glands are unremarkable. Kidneys are normal, without renal calculi, solid lesion, or hydronephrosis. Bladder is unremarkable. Stomach/Bowel: Stomach is within normal limits. Appendix not clearly visualized. Small bowel is moderately distended by negative enteric contrast. No evidence of bowel wall thickening, distention, or inflammatory changes. Specifically, short queried segments of mid small bowel in the ventral abdomen are not corroborated  on today's examination and there is no abnormal mucosal hyperenhancement or narrowing of the small bowel. Moderate burden  of stool in the colon and rectum. Sigmoid diverticulosis. Vascular/Lymphatic: No significant vascular findings are present. No enlarged abdominal or pelvic lymph nodes. Reproductive: Hysterectomy. Other: No abdominal wall hernia or abnormality. No ascites. Musculoskeletal: No acute or significant osseous findings. IMPRESSION: 1. Examination is generally limited by breath and peristaltic bowel motion artifact, particularly multiphasic contrast enhanced sequences. 2. Within this limitation, no evidence of bowel wall thickening, distention, or inflammatory changes. Specifically, short queried segments of mid small bowel in the ventral abdomen are not corroborated on today's examination and there is no abnormal mucosal hyperenhancement or narrowing of the small bowel. 3. Moderate burden of stool in the colon and rectum. Sigmoid diverticulosis without evidence of acute diverticulitis. 4. Hysterectomy. Electronically Signed   By: Marolyn JONETTA Jaksch M.D.   On: 02/01/2024 07:27    Assessment & Plan:   Problem List Items Addressed This Visit     Chronic inflammatory arthritis   Her diagnosis has changed several times , upon review of various rheumatologists and orthopedist notes from 2018 forward.  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 She is taking MTX and folic acid  , 25 mg prednisone  daily,  .  surveillance labs have been NORMAL .  Her right shoulder pain was reported today as nontraumatic ; advised to discuss having an I/A steroid injection with dr Rheumatology next month   Lab Results  Component Value Date   FOLATE >24.2 02/21/2023     Lab Results  Component Value Date   WBC 6.1 10/17/2023   HGB 12.1 10/17/2023   HCT 35.5 (L) 10/17/2023   MCV 110.4 Repeated and verified X2. (H) 10/17/2023   PLT 296.0 10/17/2023   Lab Results  Component Value Date    ALT 19 08/22/2023   AST 33 08/22/2023   ALKPHOS 55 08/22/2023   BILITOT 0.3 08/22/2023         Drug-induced osteoporosis   Last Prolia  injection was January 26 2024 per facility      Generalized anxiety disorder   Responding to low dose sertraline .  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 (H) 10/17/2023   PLT 296.0 10/17/2023   Lab Results  Component Value Date   ALT 19 08/22/2023   AST 33 08/22/2023   ALKPHOS 55 08/22/2023   BILITOT 0.3 08/22/2023         Ulcerative colitis, chronic (HCC)   Managed with Lialda  taken twice daily per recent GI eval.  In Nov 2025.  She has not had ay heamtochezia lately,  advised to  follow up with GI in March       Other Visit Diagnoses       Dyslipidemia    -  Primary   Relevant Orders   Lipid Profile   Direct LDL     Diabetes due to undrl condition w oth diabetic neuro comp (HCC)  Relevant Orders   HgB A1c          I spent 34 minutes on the day of this face to face encounter reviewing patient's  most recent visit with cardiology,  nephrology,  and neurology,  prior relevant surgical and non surgical procedures, recent  labs and imaging studies, counseling on weight management,  reviewing the assessment and plan with patient, and post visit ordering and reviewing of  diagnostics and therapeutics with patient  .   Follow-up: No follow-ups on file.   Cindy LITTIE Kettering, MD "

## 2024-07-06 NOTE — Assessment & Plan Note (Signed)
Responding to low dose sertraline.  No changes today

## 2024-07-06 NOTE — Assessment & Plan Note (Signed)
 Last Prolia  injection was January 26 2024 per facility

## 2024-07-06 NOTE — Assessment & Plan Note (Signed)
 Her diagnosis has changed several times , upon review of various rheumatologists and orthopedist notes from 2018 forward.  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 She is taking MTX and folic acid  , 25 mg prednisone  daily,  .  surveillance labs have been NORMAL .  Her right shoulder pain was reported today as nontraumatic ; advised to discuss having an I/A steroid injection with dr Rheumatology next month   Lab Results  Component Value Date   FOLATE >24.2 02/21/2023     Lab Results  Component Value Date   WBC 6.1 10/17/2023   HGB 12.1 10/17/2023   HCT 35.5 (L) 10/17/2023   MCV 110.4 Repeated and verified X2. (H) 10/17/2023   PLT 296.0 10/17/2023   Lab Results  Component Value Date   ALT 19 08/22/2023   AST 33 08/22/2023   ALKPHOS 55 08/22/2023   BILITOT 0.3 08/22/2023

## 2024-07-06 NOTE — Patient Instructions (Addendum)
 Dr Unk  advised  Cindy Oconnor to continue taking  mesalamine  2.4 mg TWICE DAILY ( NOT once daily)  until your next 4 month follow up which is due in march  You can decrease the voltaren  to three times daily   Congratulations on losing weight .  You have lost 18 lbs since last February !!   Ask your rheumatologist if you can have a steroid shot in your right shoulder

## 2024-09-17 ENCOUNTER — Ambulatory Visit: Admitting: Podiatry
